# Patient Record
Sex: Male | Born: 1965 | Race: White | Hispanic: No | Marital: Married | Smoking: Former smoker
Health system: Southern US, Community
[De-identification: ages and names within clinical notes are randomized; demographics above are authoritative.]

## PROBLEM LIST (undated history)

## (undated) DIAGNOSIS — I10 Essential (primary) hypertension: Secondary | ICD-10-CM

## (undated) DIAGNOSIS — F329 Major depressive disorder, single episode, unspecified: Secondary | ICD-10-CM

## (undated) DIAGNOSIS — M109 Gout, unspecified: Secondary | ICD-10-CM

## (undated) DIAGNOSIS — B019 Varicella without complication: Secondary | ICD-10-CM

## (undated) DIAGNOSIS — F32A Depression, unspecified: Secondary | ICD-10-CM

## (undated) DIAGNOSIS — R7989 Other specified abnormal findings of blood chemistry: Secondary | ICD-10-CM

## (undated) DIAGNOSIS — I671 Cerebral aneurysm, nonruptured: Secondary | ICD-10-CM

## (undated) DIAGNOSIS — E7800 Pure hypercholesterolemia, unspecified: Secondary | ICD-10-CM

## (undated) HISTORY — DX: Essential (primary) hypertension: I10

## (undated) HISTORY — DX: Varicella without complication: B01.9

## (undated) HISTORY — DX: Major depressive disorder, single episode, unspecified: F32.9

## (undated) HISTORY — DX: Other specified abnormal findings of blood chemistry: R79.89

## (undated) HISTORY — PX: WISDOM TOOTH EXTRACTION: SHX21

## (undated) HISTORY — DX: Depression, unspecified: F32.A

## (undated) HISTORY — DX: Cerebral aneurysm, nonruptured: I67.1

---

## 2002-08-04 ENCOUNTER — Emergency Department (HOSPITAL_COMMUNITY): Admission: EM | Admit: 2002-08-04 | Discharge: 2002-08-05 | Payer: Self-pay | Admitting: Emergency Medicine

## 2008-07-26 ENCOUNTER — Emergency Department (HOSPITAL_COMMUNITY): Admission: EM | Admit: 2008-07-26 | Discharge: 2008-07-26 | Payer: Self-pay | Admitting: Emergency Medicine

## 2015-03-02 ENCOUNTER — Inpatient Hospital Stay (HOSPITAL_COMMUNITY)
Admission: EM | Admit: 2015-03-02 | Discharge: 2015-03-12 | DRG: 020 | Disposition: A | Discharge status: Home / Self Care | Payer: Self-pay | Attending: Neurosurgery | Admitting: Neurosurgery

## 2015-03-02 ENCOUNTER — Inpatient Hospital Stay (HOSPITAL_COMMUNITY): Payer: MEDICAID

## 2015-03-02 ENCOUNTER — Emergency Department (HOSPITAL_COMMUNITY): Payer: Self-pay | Admitting: Anesthesiology

## 2015-03-02 ENCOUNTER — Emergency Department (HOSPITAL_COMMUNITY): Payer: Self-pay

## 2015-03-02 ENCOUNTER — Encounter (HOSPITAL_COMMUNITY): Payer: Self-pay | Admitting: Emergency Medicine

## 2015-03-02 ENCOUNTER — Inpatient Hospital Stay (HOSPITAL_COMMUNITY): Payer: Self-pay

## 2015-03-02 ENCOUNTER — Encounter (HOSPITAL_COMMUNITY)
Admission: EM | Disposition: A | Discharge status: Home / Self Care | Payer: Self-pay | Source: Home / Self Care | Attending: Neurosurgery

## 2015-03-02 ENCOUNTER — Emergency Department (HOSPITAL_COMMUNITY): Payer: MEDICAID | Admitting: Anesthesiology

## 2015-03-02 ENCOUNTER — Ambulatory Visit (INDEPENDENT_AMBULATORY_CARE_PROVIDER_SITE_OTHER): Payer: Self-pay | Admitting: Family Medicine

## 2015-03-02 ENCOUNTER — Inpatient Hospital Stay (HOSPITAL_COMMUNITY): Payer: MEDICAID | Admitting: Certified Registered Nurse Anesthetist

## 2015-03-02 ENCOUNTER — Inpatient Hospital Stay (HOSPITAL_COMMUNITY): Payer: Self-pay | Admitting: Certified Registered Nurse Anesthetist

## 2015-03-02 VITALS — BP 204/116 | HR 79 | Temp 98.3°F | Resp 17 | Ht 73.0 in | Wt 235.2 lb

## 2015-03-02 DIAGNOSIS — R42 Dizziness and giddiness: Secondary | ICD-10-CM

## 2015-03-02 DIAGNOSIS — E876 Hypokalemia: Secondary | ICD-10-CM | POA: Diagnosis present

## 2015-03-02 DIAGNOSIS — M542 Cervicalgia: Secondary | ICD-10-CM

## 2015-03-02 DIAGNOSIS — I10 Essential (primary) hypertension: Secondary | ICD-10-CM | POA: Diagnosis present

## 2015-03-02 DIAGNOSIS — E871 Hypo-osmolality and hyponatremia: Secondary | ICD-10-CM | POA: Diagnosis present

## 2015-03-02 DIAGNOSIS — Z4659 Encounter for fitting and adjustment of other gastrointestinal appliance and device: Secondary | ICD-10-CM

## 2015-03-02 DIAGNOSIS — J9601 Acute respiratory failure with hypoxia: Secondary | ICD-10-CM

## 2015-03-02 DIAGNOSIS — E669 Obesity, unspecified: Secondary | ICD-10-CM | POA: Diagnosis present

## 2015-03-02 DIAGNOSIS — G9340 Encephalopathy, unspecified: Secondary | ICD-10-CM | POA: Diagnosis present

## 2015-03-02 DIAGNOSIS — Z6831 Body mass index (BMI) 31.0-31.9, adult: Secondary | ICD-10-CM

## 2015-03-02 DIAGNOSIS — M109 Gout, unspecified: Secondary | ICD-10-CM | POA: Diagnosis present

## 2015-03-02 DIAGNOSIS — R519 Headache, unspecified: Secondary | ICD-10-CM

## 2015-03-02 DIAGNOSIS — I6011 Nontraumatic subarachnoid hemorrhage from right middle cerebral artery: Principal | ICD-10-CM | POA: Diagnosis present

## 2015-03-02 DIAGNOSIS — G919 Hydrocephalus, unspecified: Secondary | ICD-10-CM

## 2015-03-02 DIAGNOSIS — I609 Nontraumatic subarachnoid hemorrhage, unspecified: Secondary | ICD-10-CM

## 2015-03-02 DIAGNOSIS — Z720 Tobacco use: Secondary | ICD-10-CM

## 2015-03-02 DIAGNOSIS — T465X6A Underdosing of other antihypertensive drugs, initial encounter: Secondary | ICD-10-CM | POA: Diagnosis present

## 2015-03-02 DIAGNOSIS — E780 Pure hypercholesterolemia: Secondary | ICD-10-CM | POA: Diagnosis present

## 2015-03-02 DIAGNOSIS — E785 Hyperlipidemia, unspecified: Secondary | ICD-10-CM | POA: Diagnosis present

## 2015-03-02 DIAGNOSIS — F17210 Nicotine dependence, cigarettes, uncomplicated: Secondary | ICD-10-CM | POA: Diagnosis present

## 2015-03-02 DIAGNOSIS — Z791 Long term (current) use of non-steroidal anti-inflammatories (NSAID): Secondary | ICD-10-CM

## 2015-03-02 DIAGNOSIS — F17200 Nicotine dependence, unspecified, uncomplicated: Secondary | ICD-10-CM

## 2015-03-02 DIAGNOSIS — R51 Headache: Secondary | ICD-10-CM

## 2015-03-02 DIAGNOSIS — I739 Peripheral vascular disease, unspecified: Secondary | ICD-10-CM

## 2015-03-02 DIAGNOSIS — E872 Acidosis: Secondary | ICD-10-CM | POA: Diagnosis not present

## 2015-03-02 HISTORY — DX: Pure hypercholesterolemia, unspecified: E78.00

## 2015-03-02 HISTORY — DX: Gout, unspecified: M10.9

## 2015-03-02 HISTORY — PX: CRANIOTOMY: SHX93

## 2015-03-02 HISTORY — PX: RADIOLOGY WITH ANESTHESIA: SHX6223

## 2015-03-02 LAB — BLOOD GAS, ARTERIAL
Acid-base deficit: 3.9 mmol/L — ABNORMAL HIGH (ref 0.0–2.0)
Bicarbonate: 21.9 mEq/L (ref 20.0–24.0)
DRAWN BY: 29017
FIO2: 0.4 %
LHR: 14 {breaths}/min
O2 Saturation: 91.3 %
PCO2 ART: 48.9 mmHg — AB (ref 35.0–45.0)
PEEP: 5 cmH2O
PO2 ART: 68.8 mmHg — AB (ref 80.0–100.0)
Patient temperature: 98.6
TCO2: 23.4 mmol/L (ref 0–100)
VT: 500 mL
pH, Arterial: 7.273 — ABNORMAL LOW (ref 7.350–7.450)

## 2015-03-02 LAB — URINALYSIS, ROUTINE W REFLEX MICROSCOPIC
Bilirubin Urine: NEGATIVE
Glucose, UA: NEGATIVE mg/dL
HGB URINE DIPSTICK: NEGATIVE
Ketones, ur: NEGATIVE mg/dL
Leukocytes, UA: NEGATIVE
NITRITE: NEGATIVE
PH: 6 (ref 5.0–8.0)
Protein, ur: 30 mg/dL — AB
SPECIFIC GRAVITY, URINE: 1.017 (ref 1.005–1.030)
Urobilinogen, UA: 0.2 mg/dL (ref 0.0–1.0)

## 2015-03-02 LAB — BASIC METABOLIC PANEL
ANION GAP: 8 (ref 5–15)
BUN: 11 mg/dL (ref 6–20)
CALCIUM: 9.2 mg/dL (ref 8.9–10.3)
CO2: 25 mmol/L (ref 22–32)
Chloride: 106 mmol/L (ref 101–111)
Creatinine, Ser: 0.79 mg/dL (ref 0.61–1.24)
GFR calc Af Amer: >60 mL/min (ref >60)
GFR calc non Af Amer: >60 mL/min (ref >60)
Glucose, Bld: 107 mg/dL — ABNORMAL HIGH (ref 65–99)
Potassium: 3.7 mmol/L (ref 3.5–5.1)
SODIUM: 139 mmol/L (ref 135–145)

## 2015-03-02 LAB — ABO/RH: ABO/RH(D): A NEG

## 2015-03-02 LAB — CBC WITH DIFFERENTIAL/PLATELET
BASOS ABS: 0 10*3/uL (ref 0.0–0.1)
BASOS PCT: 0 % (ref 0–1)
EOS PCT: 1 % (ref 0–5)
Eosinophils Absolute: 0.1 10*3/uL (ref 0.0–0.7)
HEMATOCRIT: 40.9 % (ref 39.0–52.0)
Hemoglobin: 14.3 g/dL (ref 13.0–17.0)
Lymphocytes Relative: 18 % (ref 12–46)
Lymphs Abs: 1.7 10*3/uL (ref 0.7–4.0)
MCH: 32.3 pg (ref 26.0–34.0)
MCHC: 35 g/dL (ref 30.0–36.0)
MCV: 92.3 fL (ref 78.0–100.0)
MONO ABS: 0.4 10*3/uL (ref 0.1–1.0)
Monocytes Relative: 4 % (ref 3–12)
Neutro Abs: 7.3 10*3/uL (ref 1.7–7.7)
Neutrophils Relative %: 77 % (ref 43–77)
Platelets: 260 10*3/uL (ref 150–400)
RBC: 4.43 MIL/uL (ref 4.22–5.81)
RDW: 13.3 % (ref 11.5–15.5)
WBC: 9.5 10*3/uL (ref 4.0–10.5)

## 2015-03-02 LAB — URINE MICROSCOPIC-ADD ON

## 2015-03-02 LAB — PREPARE RBC (CROSSMATCH)

## 2015-03-02 SURGERY — CRANIOTOMY INTRACRANIAL ANEURYSM FOR CAROTID
Anesthesia: General | Site: Head | Laterality: Right

## 2015-03-02 SURGERY — RADIOLOGY WITH ANESTHESIA
Anesthesia: General

## 2015-03-02 MED ORDER — DEXAMETHASONE SODIUM PHOSPHATE 10 MG/ML IJ SOLN
INTRAMUSCULAR | Status: AC
  Filled 2015-03-02: qty 2

## 2015-03-02 MED ORDER — MORPHINE SULFATE 2 MG/ML IJ SOLN: 1.0000 mg | INTRAMUSCULAR | Status: DC | PRN

## 2015-03-02 MED ORDER — ONDANSETRON HCL 4 MG/2ML IJ SOLN: 4.0000 mg | INTRAMUSCULAR | Status: DC | PRN

## 2015-03-02 MED ORDER — 0.9 % SODIUM CHLORIDE (POUR BTL) OPTIME
TOPICAL | Status: DC | PRN
  Administered 2015-03-02 (×3): 1000 mL

## 2015-03-02 MED ORDER — LEVETIRACETAM 500 MG/5ML IV SOLN
500.0000 mg | Freq: Two times a day (BID) | INTRAVENOUS | Status: DC
  Administered 2015-03-03 – 2015-03-06 (×7): 500 mg via INTRAVENOUS
  Filled 2015-03-02 (×8): qty 5

## 2015-03-02 MED ORDER — NIMODIPINE 30 MG PO CAPS
60.0000 mg | ORAL_CAPSULE | ORAL | Status: DC
  Administered 2015-03-03 – 2015-03-12 (×55): 60 mg via ORAL
  Filled 2015-03-02 (×63): qty 2

## 2015-03-02 MED ORDER — MANNITOL 25 % IV SOLN
INTRAVENOUS | Status: DC | PRN
  Administered 2015-03-02 (×2): 12.5 g via INTRAVENOUS

## 2015-03-02 MED ORDER — SODIUM CHLORIDE 0.9 % IV SOLN
INTRAVENOUS | Status: DC | PRN
  Administered 2015-03-02: 16:00:00 via INTRAVENOUS

## 2015-03-02 MED ORDER — LABETALOL HCL 5 MG/ML IV SOLN
10.0000 mg | INTRAVENOUS | Status: DC | PRN
  Administered 2015-03-04: 20 mg via INTRAVENOUS
  Administered 2015-03-04: 10 mg via INTRAVENOUS
  Administered 2015-03-04: 20 mg via INTRAVENOUS
  Administered 2015-03-04: 10 mg via INTRAVENOUS
  Administered 2015-03-04: 20 mg via INTRAVENOUS
  Filled 2015-03-02 (×2): qty 4
  Filled 2015-03-02: qty 8

## 2015-03-02 MED ORDER — DEXAMETHASONE SODIUM PHOSPHATE 10 MG/ML IJ SOLN
INTRAMUSCULAR | Status: DC | PRN
  Administered 2015-03-02: 10 mg via INTRAVENOUS

## 2015-03-02 MED ORDER — POTASSIUM CHLORIDE 20 MEQ/15ML (10%) PO SOLN
40.0000 meq | Freq: Once | ORAL | Status: DC
  Filled 2015-03-02: qty 30

## 2015-03-02 MED ORDER — FENTANYL CITRATE (PF) 250 MCG/5ML IJ SOLN
INTRAMUSCULAR | Status: AC
  Filled 2015-03-02: qty 5

## 2015-03-02 MED ORDER — BUPIVACAINE HCL 0.5 % IJ SOLN
INTRAMUSCULAR | Status: DC | PRN
  Administered 2015-03-02: 7.5 mL

## 2015-03-02 MED ORDER — BACITRACIN ZINC 500 UNIT/GM EX OINT
TOPICAL_OINTMENT | CUTANEOUS | Status: DC | PRN
  Administered 2015-03-02: 1 via TOPICAL

## 2015-03-02 MED ORDER — CETYLPYRIDINIUM CHLORIDE 0.05 % MT LIQD
7.0000 mL | Freq: Four times a day (QID) | OROMUCOSAL | Status: DC
  Administered 2015-03-03 (×4): 7 mL via OROMUCOSAL

## 2015-03-02 MED ORDER — MICROFIBRILLAR COLL HEMOSTAT EX PADS
MEDICATED_PAD | CUTANEOUS | Status: DC | PRN
  Administered 2015-03-02: 1 via TOPICAL

## 2015-03-02 MED ORDER — DEXAMETHASONE SODIUM PHOSPHATE 10 MG/ML IJ SOLN
6.0000 mg | Freq: Four times a day (QID) | INTRAMUSCULAR | Status: AC
  Administered 2015-03-02 – 2015-03-03 (×4): 6 mg via INTRAVENOUS
  Filled 2015-03-02: qty 1
  Filled 2015-03-02: qty 0.6
  Filled 2015-03-02 (×2): qty 1
  Filled 2015-03-02: qty 0.6

## 2015-03-02 MED ORDER — SUCCINYLCHOLINE CHLORIDE 20 MG/ML IJ SOLN
INTRAMUSCULAR | Status: DC | PRN
Start: 2015-03-02 — End: 2015-03-02
  Administered 2015-03-02: 120 mg via INTRAVENOUS

## 2015-03-02 MED ORDER — LABETALOL HCL 5 MG/ML IV SOLN
5.0000 mg | Freq: Once | INTRAVENOUS | Status: AC
  Administered 2015-03-02: 5 mg via INTRAVENOUS
  Filled 2015-03-02: qty 4

## 2015-03-02 MED ORDER — PHENYLEPHRINE 40 MCG/ML (10ML) SYRINGE FOR IV PUSH (FOR BLOOD PRESSURE SUPPORT)
PREFILLED_SYRINGE | INTRAVENOUS | Status: AC
  Filled 2015-03-02: qty 10

## 2015-03-02 MED ORDER — NICARDIPINE HCL IN NACL 20-0.86 MG/200ML-% IV SOLN: 3.0000 mg/h | INTRAVENOUS | Status: DC

## 2015-03-02 MED ORDER — SENNOSIDES-DOCUSATE SODIUM 8.6-50 MG PO TABS
1.0000 | ORAL_TABLET | Freq: Every evening | ORAL | Status: DC | PRN
  Filled 2015-03-02: qty 1

## 2015-03-02 MED ORDER — THROMBIN 5000 UNITS EX SOLR
OROMUCOSAL | Status: DC | PRN
  Administered 2015-03-02: 19:00:00 via TOPICAL

## 2015-03-02 MED ORDER — ARTIFICIAL TEARS OP OINT
TOPICAL_OINTMENT | OPHTHALMIC | Status: DC | PRN
Start: 2015-03-02 — End: 2015-03-02
  Administered 2015-03-02: 1 via OPHTHALMIC

## 2015-03-02 MED ORDER — PROPOFOL 1000 MG/100ML IV EMUL
0.0000 ug/kg/min | INTRAVENOUS | Status: DC
Start: 2015-03-02 — End: 2015-03-06
  Administered 2015-03-02: 35 ug/kg/min via INTRAVENOUS
  Administered 2015-03-03: 30.019 ug/kg/min via INTRAVENOUS
  Administered 2015-03-03: 5 ug/kg/min via INTRAVENOUS
  Filled 2015-03-02 (×3): qty 100

## 2015-03-02 MED ORDER — IOHEXOL 350 MG/ML SOLN
80.0000 mL | Freq: Once | INTRAVENOUS | Status: AC | PRN
  Administered 2015-03-02: 80 mL via INTRAVENOUS

## 2015-03-02 MED ORDER — LIDOCAINE HCL (CARDIAC) 20 MG/ML IV SOLN
INTRAVENOUS | Status: DC | PRN
  Administered 2015-03-02: 100 mg via INTRAVENOUS

## 2015-03-02 MED ORDER — INDOCYANINE GREEN 25 MG IV SOLR
5.0000 mg | Freq: Once | INTRAVENOUS | Status: AC
  Administered 2015-03-02: 12.5 mg via INTRAVENOUS
  Filled 2015-03-02: qty 25

## 2015-03-02 MED ORDER — NICARDIPINE HCL IN NACL 20-0.86 MG/200ML-% IV SOLN
5.0000 mg/h | Freq: Once | INTRAVENOUS | Status: AC
  Administered 2015-03-02: 5 mg/h via INTRAVENOUS
  Filled 2015-03-02: qty 200

## 2015-03-02 MED ORDER — PANTOPRAZOLE SODIUM 40 MG IV SOLR
40.0000 mg | Freq: Every day | INTRAVENOUS | Status: DC
  Administered 2015-03-02 – 2015-03-03 (×2): 40 mg via INTRAVENOUS
  Filled 2015-03-02 (×3): qty 40

## 2015-03-02 MED ORDER — FENTANYL CITRATE (PF) 100 MCG/2ML IJ SOLN
INTRAMUSCULAR | Status: DC | PRN
  Administered 2015-03-02: 100 ug via INTRAVENOUS

## 2015-03-02 MED ORDER — FENTANYL CITRATE (PF) 100 MCG/2ML IJ SOLN
INTRAMUSCULAR | Status: DC | PRN
  Administered 2015-03-02 (×7): 50 ug via INTRAVENOUS

## 2015-03-02 MED ORDER — DEXAMETHASONE SODIUM PHOSPHATE 4 MG/ML IJ SOLN
4.0000 mg | Freq: Four times a day (QID) | INTRAMUSCULAR | Status: AC
  Administered 2015-03-04 (×4): 4 mg via INTRAVENOUS
  Filled 2015-03-02 (×5): qty 1

## 2015-03-02 MED ORDER — SODIUM CHLORIDE 0.9 % IV SOLN
1000.0000 mg | Freq: Two times a day (BID) | INTRAVENOUS | Status: AC
  Administered 2015-03-02: 1000 mg via INTRAVENOUS
  Filled 2015-03-02: qty 10

## 2015-03-02 MED ORDER — CEFAZOLIN SODIUM-DEXTROSE 2-3 GM-% IV SOLR
2.0000 g | Freq: Three times a day (TID) | INTRAVENOUS | Status: AC
  Administered 2015-03-02 – 2015-03-03 (×2): 2 g via INTRAVENOUS
  Filled 2015-03-02 (×2): qty 50

## 2015-03-02 MED ORDER — PROMETHAZINE HCL 25 MG PO TABS: 12.5000 mg | ORAL_TABLET | ORAL | Status: DC | PRN

## 2015-03-02 MED ORDER — CEFAZOLIN SODIUM-DEXTROSE 2-3 GM-% IV SOLR
INTRAVENOUS | Status: DC | PRN
  Administered 2015-03-02: 2 g via INTRAVENOUS

## 2015-03-02 MED ORDER — CEFAZOLIN SODIUM-DEXTROSE 2-3 GM-% IV SOLR
INTRAVENOUS | Status: AC
  Filled 2015-03-02: qty 50

## 2015-03-02 MED ORDER — BISACODYL 10 MG RE SUPP: 10.0000 mg | Freq: Every day | RECTAL | Status: DC | PRN

## 2015-03-02 MED ORDER — ONDANSETRON HCL 4 MG PO TABS: 4.0000 mg | ORAL_TABLET | ORAL | Status: DC | PRN

## 2015-03-02 MED ORDER — NICARDIPINE HCL IN NACL 20-0.86 MG/200ML-% IV SOLN
5.0000 mg/h | Freq: Once | INTRAVENOUS | Status: AC
  Administered 2015-03-02: 10 mg/h via INTRAVENOUS
  Administered 2015-03-02: 12 mg/h via INTRAVENOUS
  Filled 2015-03-02: qty 200

## 2015-03-02 MED ORDER — PROPOFOL INFUSION 10 MG/ML OPTIME
INTRAVENOUS | Status: DC | PRN
  Administered 2015-03-02: 40 ug/kg/min via INTRAVENOUS

## 2015-03-02 MED ORDER — ROCURONIUM BROMIDE 100 MG/10ML IV SOLN
INTRAVENOUS | Status: DC | PRN
  Administered 2015-03-02 (×2): 20 mg via INTRAVENOUS
  Administered 2015-03-02: 10 mg via INTRAVENOUS
  Administered 2015-03-02: 50 mg via INTRAVENOUS
  Administered 2015-03-02: 20 mg via INTRAVENOUS
  Administered 2015-03-02: 50 mg via INTRAVENOUS
  Administered 2015-03-02: 10 mg via INTRAVENOUS
  Administered 2015-03-02: 20 mg via INTRAVENOUS

## 2015-03-02 MED ORDER — NIMODIPINE 60 MG/20ML PO SOLN
60.0000 mg | ORAL | Status: DC
  Administered 2015-03-03 (×4): 60 mg
  Filled 2015-03-02 (×15): qty 20

## 2015-03-02 MED ORDER — LIDOCAINE HCL (PF) 1 % IJ SOLN
INTRAMUSCULAR | Status: DC | PRN
  Administered 2015-03-02: 7.5 mL

## 2015-03-02 MED ORDER — ROCURONIUM BROMIDE 50 MG/5ML IV SOLN
INTRAVENOUS | Status: AC
  Filled 2015-03-02: qty 1

## 2015-03-02 MED ORDER — PROPOFOL INFUSION 10 MG/ML OPTIME: INTRAVENOUS | Status: DC | PRN

## 2015-03-02 MED ORDER — DEXAMETHASONE SODIUM PHOSPHATE 4 MG/ML IJ SOLN
4.0000 mg | Freq: Three times a day (TID) | INTRAMUSCULAR | Status: DC
  Administered 2015-03-04 – 2015-03-10 (×17): 4 mg via INTRAVENOUS
  Filled 2015-03-02 (×27): qty 1

## 2015-03-02 MED ORDER — CHLORHEXIDINE GLUCONATE 0.12 % MT SOLN
15.0000 mL | Freq: Two times a day (BID) | OROMUCOSAL | Status: DC
  Administered 2015-03-02 – 2015-03-03 (×2): 15 mL via OROMUCOSAL
  Filled 2015-03-02 (×2): qty 15

## 2015-03-02 MED ORDER — FENTANYL CITRATE (PF) 100 MCG/2ML IJ SOLN
100.0000 ug | INTRAMUSCULAR | Status: AC | PRN
  Administered 2015-03-03 (×3): 100 ug via INTRAVENOUS
  Filled 2015-03-02 (×2): qty 2

## 2015-03-02 MED ORDER — MANNITOL 25 % IV SOLN
25.0000 g | Freq: Once | INTRAVENOUS | Status: DC
  Filled 2015-03-02: qty 100

## 2015-03-02 MED ORDER — PROPOFOL 10 MG/ML IV BOLUS
INTRAVENOUS | Status: DC | PRN
  Administered 2015-03-02 (×2): 50 mg via INTRAVENOUS

## 2015-03-02 MED ORDER — PROPOFOL 10 MG/ML IV BOLUS
INTRAVENOUS | Status: DC | PRN
  Administered 2015-03-02: 50 mg via INTRAVENOUS
  Administered 2015-03-02: 200 mg via INTRAVENOUS
  Administered 2015-03-02 (×2): 50 mg via INTRAVENOUS

## 2015-03-02 MED ORDER — IOHEXOL 300 MG/ML  SOLN
50.0000 mL | Freq: Once | INTRAMUSCULAR | Status: AC | PRN
  Administered 2015-03-02: 50 mL via INTRAVENOUS

## 2015-03-02 MED ORDER — THROMBIN 20000 UNITS EX SOLR
CUTANEOUS | Status: DC | PRN
  Administered 2015-03-02: 19:00:00 via TOPICAL

## 2015-03-02 MED ORDER — FENTANYL CITRATE (PF) 100 MCG/2ML IJ SOLN
100.0000 ug | INTRAMUSCULAR | Status: DC | PRN
  Administered 2015-03-03 – 2015-03-09 (×2): 100 ug via INTRAVENOUS
  Filled 2015-03-02 (×3): qty 2

## 2015-03-02 MED ORDER — PROPOFOL INFUSION 10 MG/ML OPTIME
INTRAVENOUS | Status: DC | PRN
  Administered 2015-03-02: 75 ug/kg/min via INTRAVENOUS

## 2015-03-02 MED ORDER — SODIUM CHLORIDE 0.9 % IV SOLN
INTRAVENOUS | Status: DC | PRN
  Administered 2015-03-02 (×2): via INTRAVENOUS

## 2015-03-02 MED ORDER — PHENYLEPHRINE 40 MCG/ML (10ML) SYRINGE FOR IV PUSH (FOR BLOOD PRESSURE SUPPORT)
PREFILLED_SYRINGE | INTRAVENOUS | Status: AC
  Filled 2015-03-02: qty 20

## 2015-03-02 MED ORDER — SODIUM CHLORIDE 0.9 % IR SOLN
Status: DC | PRN
  Administered 2015-03-02: 19:00:00

## 2015-03-02 MED ORDER — SODIUM CHLORIDE 0.9 % IV SOLN: Freq: Once | INTRAVENOUS | Status: DC

## 2015-03-02 MED ORDER — SODIUM CHLORIDE 0.9 % IV SOLN
INTRAVENOUS | Status: DC
  Administered 2015-03-02 – 2015-03-06 (×3): via INTRAVENOUS

## 2015-03-02 MED ORDER — NICARDIPINE HCL IN NACL 20-0.86 MG/200ML-% IV SOLN
INTRAVENOUS | Status: DC | PRN
  Administered 2015-03-02: 10 mg/h via INTRAVENOUS

## 2015-03-02 SURGICAL SUPPLY — 103 items
BANDAGE GAUZE 4  KLING STR (GAUZE/BANDAGES/DRESSINGS) ×6 IMPLANT
BATTERY IQ STERILE (MISCELLANEOUS) ×3 IMPLANT
BENZOIN TINCTURE PRP APPL 2/3 (GAUZE/BANDAGES/DRESSINGS) IMPLANT
BIT DRILL WIRE PASS 1.3MM (BIT) IMPLANT
BLADE SAW GIGLI 16 STRL (MISCELLANEOUS) IMPLANT
BLADE SURG 15 STRL LF DISP TIS (BLADE) ×1 IMPLANT
BLADE SURG 15 STRL SS (BLADE) ×2
BLADE ULTRA TIP 2M (BLADE) IMPLANT
BNDG GAUZE ELAST 4 BULKY (GAUZE/BANDAGES/DRESSINGS) ×6 IMPLANT
BRUSH SCRUB EZ PLAIN DRY (MISCELLANEOUS) ×3 IMPLANT
BUR ACORN 6.0 PRECISION (BURR) ×2 IMPLANT
BUR ACORN 6.0MM PRECISION (BURR) ×1
BUR ADDG 1.1 (BURR) IMPLANT
BUR ADDG 1.1MM (BURR)
BUR MATCHSTICK NEURO 3.0 LAGG (BURR) IMPLANT
BUR ROUND FLUTED 4 SOFT TCH (BURR) IMPLANT
BUR ROUND FLUTED 4MM SOFT TCH (BURR)
BUR ROUND FLUTED 5 RND (BURR) ×2 IMPLANT
BUR ROUND FLUTED 5MM RND (BURR) ×1
BUR SPIRAL ROUTER 2.3 (BUR) IMPLANT
BUR SPIRAL ROUTER 2.3MM (BUR)
CANISTER SUCT 3000ML PPV (MISCELLANEOUS) ×3 IMPLANT
CLIP ANEURY TI PERM MINI STR 5 (Clip) ×3 IMPLANT
CLIP TI MEDIUM 6 (CLIP) IMPLANT
CONT SPEC 4OZ CLIKSEAL STRL BL (MISCELLANEOUS) ×3 IMPLANT
CORDS BIPOLAR (ELECTRODE) IMPLANT
COVER MAYO STAND STRL (DRAPES) IMPLANT
DECANTER SPIKE VIAL GLASS SM (MISCELLANEOUS) ×3 IMPLANT
DRAIN SNY WOU 7FLT (WOUND CARE) IMPLANT
DRAPE MICROSCOPE LEICA (MISCELLANEOUS) ×3 IMPLANT
DRAPE NEUROLOGICAL W/INCISE (DRAPES) ×3 IMPLANT
DRAPE WARM FLUID 44X44 (DRAPE) ×3 IMPLANT
DRILL WIRE PASS 1.3MM (BIT)
DRSG ADAPTIC 3X8 NADH LF (GAUZE/BANDAGES/DRESSINGS) ×3 IMPLANT
DRSG TELFA 3X8 NADH (GAUZE/BANDAGES/DRESSINGS) IMPLANT
DURAMATRIX ONLAY 3X3 (Plate) ×3 IMPLANT
DURAPREP 6ML APPLICATOR 50/CS (WOUND CARE) ×3 IMPLANT
ELECT CAUTERY BLADE 6.4 (BLADE) ×3 IMPLANT
ELECT REM PT RETURN 9FT ADLT (ELECTROSURGICAL) ×3
ELECTRODE REM PT RTRN 9FT ADLT (ELECTROSURGICAL) ×1 IMPLANT
EVACUATOR SILICONE 100CC (DRAIN) IMPLANT
FORCEPS BIPOLAR SPETZLER 8 1.0 (NEUROSURGERY SUPPLIES) ×3 IMPLANT
GAUZE SPONGE 4X4 12PLY STRL (GAUZE/BANDAGES/DRESSINGS) ×3 IMPLANT
GAUZE SPONGE 4X4 16PLY XRAY LF (GAUZE/BANDAGES/DRESSINGS) IMPLANT
GLOVE BIO SURGEON STRL SZ 6.5 (GLOVE) ×8 IMPLANT
GLOVE BIO SURGEON STRL SZ7 (GLOVE) ×3 IMPLANT
GLOVE BIO SURGEON STRL SZ8 (GLOVE) ×3 IMPLANT
GLOVE BIO SURGEON STRL SZ8.5 (GLOVE) ×3 IMPLANT
GLOVE BIO SURGEONS STRL SZ 6.5 (GLOVE) ×4
GLOVE BIOGEL PI IND STRL 7.0 (GLOVE) ×1 IMPLANT
GLOVE BIOGEL PI IND STRL 7.5 (GLOVE) ×1 IMPLANT
GLOVE BIOGEL PI INDICATOR 7.0 (GLOVE) ×2
GLOVE BIOGEL PI INDICATOR 7.5 (GLOVE) ×2
GLOVE ECLIPSE 7.0 STRL STRAW (GLOVE) ×6 IMPLANT
GLOVE EXAM NITRILE LRG STRL (GLOVE) IMPLANT
GLOVE EXAM NITRILE MD LF STRL (GLOVE) IMPLANT
GLOVE EXAM NITRILE XL STR (GLOVE) IMPLANT
GLOVE EXAM NITRILE XS STR PU (GLOVE) IMPLANT
GLOVE INDICATOR 6.5 STRL GRN (GLOVE) ×6 IMPLANT
GOWN STRL REUS W/ TWL LRG LVL3 (GOWN DISPOSABLE) ×4 IMPLANT
GOWN STRL REUS W/ TWL XL LVL3 (GOWN DISPOSABLE) ×1 IMPLANT
GOWN STRL REUS W/TWL 2XL LVL3 (GOWN DISPOSABLE) IMPLANT
GOWN STRL REUS W/TWL LRG LVL3 (GOWN DISPOSABLE) ×8
GOWN STRL REUS W/TWL XL LVL3 (GOWN DISPOSABLE) ×2
HEMOSTAT SURGICEL 2X14 (HEMOSTASIS) ×3 IMPLANT
HOOK DURA (MISCELLANEOUS) ×3 IMPLANT
KIT BASIN OR (CUSTOM PROCEDURE TRAY) ×3 IMPLANT
KIT DRAIN CSF ACCUDRAIN (MISCELLANEOUS) IMPLANT
KIT ROOM TURNOVER OR (KITS) ×3 IMPLANT
KNIFE ARACHNOID DISP AM-24-S (MISCELLANEOUS) ×3 IMPLANT
NEEDLE HYPO 25X1 1.5 SAFETY (NEEDLE) ×3 IMPLANT
NS IRRIG 1000ML POUR BTL (IV SOLUTION) ×3 IMPLANT
PACK CRANIOTOMY (CUSTOM PROCEDURE TRAY) ×3 IMPLANT
PAD ARMBOARD 7.5X6 YLW CONV (MISCELLANEOUS) ×3 IMPLANT
PATTIES SURGICAL .25X.25 (GAUZE/BANDAGES/DRESSINGS) IMPLANT
PATTIES SURGICAL .5 X.5 (GAUZE/BANDAGES/DRESSINGS) IMPLANT
PATTIES SURGICAL .5 X3 (DISPOSABLE) IMPLANT
PATTIES SURGICAL 1/4 X 3 (GAUZE/BANDAGES/DRESSINGS) IMPLANT
PATTIES SURGICAL 1X1 (DISPOSABLE) IMPLANT
PIN MAYFIELD SKULL DISP (PIN) IMPLANT
PLATE 1.5  2HOLE LNG NEURO (Plate) ×6 IMPLANT
PLATE 1.5 2HOLE LNG NEURO (Plate) ×3 IMPLANT
RUBBERBAND STERILE (MISCELLANEOUS) ×6 IMPLANT
SCREW SELF DRILL HT 1.5/4MM (Screw) ×18 IMPLANT
SPONGE NEURO XRAY DETECT 1X3 (DISPOSABLE) IMPLANT
SPONGE SURGIFOAM ABS GEL 100 (HEMOSTASIS) ×3 IMPLANT
SPONGE SURGIFOAM ABS GEL 100C (HEMOSTASIS) IMPLANT
STAPLER VISISTAT 35W (STAPLE) ×3 IMPLANT
STOCKINETTE 6  STRL (DRAPES) ×2
STOCKINETTE 6 STRL (DRAPES) ×1 IMPLANT
SUT ETHILON 3 0 FSL (SUTURE) IMPLANT
SUT NURALON 4 0 TR CR/8 (SUTURE) ×6 IMPLANT
SUT VIC AB 0 CT1 18XCR BRD8 (SUTURE) ×2 IMPLANT
SUT VIC AB 0 CT1 8-18 (SUTURE) ×4
SUT VIC AB 3-0 SH 8-18 (SUTURE) ×9 IMPLANT
SYR 20ML ECCENTRIC (SYRINGE) ×3 IMPLANT
SYR CONTROL 10ML LL (SYRINGE) ×3 IMPLANT
TAPE CLOTH 1X10 TAN NS (GAUZE/BANDAGES/DRESSINGS) ×3 IMPLANT
TOWEL OR 17X24 6PK STRL BLUE (TOWEL DISPOSABLE) ×3 IMPLANT
TOWEL OR 17X26 10 PK STRL BLUE (TOWEL DISPOSABLE) ×3 IMPLANT
TRAY FOLEY W/METER SILVER 14FR (SET/KITS/TRAYS/PACK) IMPLANT
UNDERPAD 30X30 INCONTINENT (UNDERPADS AND DIAPERS) IMPLANT
WATER STERILE IRR 1000ML POUR (IV SOLUTION) ×3 IMPLANT

## 2015-03-02 NOTE — Patient Instructions (Signed)
Go to [facility] ER. He will need to be checked into a rule out for a stroke or aneurysm.

## 2015-03-02 NOTE — ED Provider Notes (Signed)
CSN: [id number]     Arrival date & time 03/02/15  1043 History   First MD Initiated Contact with Patient 03/02/15 1101     Chief Complaint  Patient presents with  . Headache  . Hypertension     (Consider location/radiation/quality/duration/timing/severity/associated sxs/prior Treatment) HPI Daniel Butler is a 49 y.o. male with history of hypertension, high cholesterol and gout who comes in for evaluation of headache. Patient states yesterday morning at approximately 3 AM, he woke up per his usual schedule and sat down couch and immediately experienced a sudden onset pounding headache throughout his entire head. He reports putting an ice pack on his head and experiencing immediate relief. He also reports using NSAIDs that also contributed a little bit. He reports maximal intensity at onset and now rates his discomfort as a 5/10. His wife is with him who reports that he is at baseline. Denies any vision changes, confusion, numbness or weakness, difficulties with ambulation, slurred speech, unilateral deficits. No chest pain, shortness of breath, nausea or vomiting, photophobia, phonophobia.  Patient reports he was put on blood pressure medications, but discontinued them voluntarily 15 years ago because he thought his blood pressure was in the normal range after checking it at [facility]  Past Medical History  Diagnosis Date  . Hypertension   . High cholesterol   . Gout    Past Surgical History  Procedure Laterality Date  . Wisdom tooth extraction     Family History  Problem Relation Age of Onset  . Adopted: Yes   History  Substance Use Topics  . Smoking status: Current Every Day Smoker -- 1.50 packs/day    Types: Cigarettes  . Smokeless tobacco: Not on file  . Alcohol Use: 21.0 oz/week    0 Standard drinks or equivalent, 35 Cans of beer per week    Review of Systems A 10 point review of systems was completed and was negative except for pertinent positives and negatives as  mentioned in the history of present illness     Allergies  Review of patient's allergies indicates no known allergies.  Home Medications   Prior to Admission medications   Medication Sig Start Date End Date Taking? Authorizing Provider  Aspirin-Acetaminophen-Caffeine (GOODY HEADACHE PO) Take by mouth.   Yes Historical Provider, MD  ibuprofen (ADVIL,MOTRIN) 200 MG tablet Take 200 mg by mouth every 6 (six) hours as needed.   Yes Historical Provider, MD   BP 193/84 mmHg  Pulse 73  Temp(Src) 98.2 F (36.8 C)  Resp 11  Ht  (1.854 m)  Wt 235 lb (106.595 kg)  BMI 31.01 kg/m2  SpO2 100% Physical Exam  Constitutional: He is oriented to person, place, and time. He appears well-developed and well-nourished.  HENT:  Head: Normocephalic and atraumatic.  Mouth/Throat: Oropharynx is clear and moist.  Eyes: Conjunctivae are normal. Pupils are equal, round, and reactive to light. Right eye exhibits no discharge. Left eye exhibits no discharge. No scleral icterus.  Neck: Neck supple.  Cardiovascular: Normal rate, regular rhythm and normal heart sounds.   Pulmonary/Chest: Effort normal and breath sounds normal. No respiratory distress. He has no wheezes. He has no rales.  Abdominal: Soft. There is no tenderness.  Musculoskeletal: He exhibits no tenderness.  Neurological: He is alert and oriented to person, place, and time.  Cranial Nerves II-XII grossly intact. Motor and sensation 5/5 in all 4 extremities. Extraocular movements intact without nystagmus. Completes finger to nose coordination movements without difficulty.  Skin: Skin is warm and  dry. No rash noted.  Psychiatric: He has a normal mood and affect.  Nursing note and vitals reviewed.   ED Course  Procedures (including critical care time) Labs Review Labs Reviewed  BASIC METABOLIC PANEL - Abnormal; Notable for the following:    Glucose, Bld 107 (*)    All other components within normal limits  URINALYSIS, ROUTINE W REFLEX  MICROSCOPIC (NOT AT [facility]) - Abnormal; Notable for the following:    Protein, ur 30 (*)    All other components within normal limits  CBC WITH DIFFERENTIAL/PLATELET  URINE MICROSCOPIC-ADD ON    Imaging Review Ct Angio Head W/cm &/or Wo Cm  03/02/2015   ADDENDUM REPORT: 03/02/2015 16:46  ADDENDUM: Critical Value/emergent results were called by telephone at the time of interpretation on 03/02/2015 at 3:20 to [facility] PA and Gray Bernhardt MD, who verbally acknowledged these results.   Electronically Signed   By: Marlan Palau M.D.   On: 03/02/2015 16:46   03/02/2015   CLINICAL DATA:  Subarachnoid hemorrhage  EXAM: CT ANGIOGRAPHY HEAD  TECHNIQUE: Multidetector CT imaging of the head was performed using the standard protocol during bolus administration of intravenous contrast. Multiplanar CT image reconstructions and MIPs were obtained to evaluate the vascular anatomy.  CONTRAST:  80mL OMNIPAQUE IOHEXOL 350 MG/ML SOLN  COMPARISON:  CT head from today  FINDINGS: CT HEAD  Brain: Subarachnoid hemorrhage, right greater than left. Mild intraventricular hemorrhage with early hydrocephalus. No acute ischemic infarct.  Calvarium and skull base:  negative  Paranasal sinuses:  mild mucosal edema in the paranasal sinuses.  Orbits: Negative  CTA HEAD  Anterior circulation: Cavernous carotid widely patent bilaterally. Anterior and middle cerebral arteries are patent bilaterally without significant stenosis.  Aneurysm of the right middle cerebral artery measures 7.4 x 4.8 mm. The aneurysm projects superiorly and laterally from the right middle cerebral artery at a branch point. There is a small temporal lobe branch arising from the M1 segment at the base of the aneurysm. No other aneurysm in the anterior circulation.  Posterior circulation: Both vertebral arteries are patent to the basilar. Left vertebral patent. PICA patent bilaterally. Basilar widely patent. Superior cerebellar and posterior cerebral arteries widely  patent bilaterally without stenosis or aneurysm.  Venous sinuses: Patent  Anatomic variants: None  Delayed phase:No enhancing mass lesion.  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 The aneurysm measures 7.4 x 4.8 mm. No significant vasospasm or vessel occlusion. No other aneurysm.  Early hydrocephalus.  Electronically Signed: By: Marlan Palau M.D. On: 03/02/2015 15:48   Ct Head Wo Contrast  03/02/2015   CLINICAL DATA:  Two day history of severe posterior headache.  EXAM: CT HEAD WITHOUT CONTRAST  TECHNIQUE: Contiguous axial images were obtained from the base of the skull through the vertex without intravenous contrast.  COMPARISON:  None.  FINDINGS: There is fairly diffuse subarachnoid hemorrhage noted with a preponderance of blood in the right sylvian fissure and right parietal lobe. This is likely due to an aneurysm. There is also intraventricular hemorrhage noted in the left frontal horn, third ventricle and layering in the occipital horns. A small amount of blood may also be in the fourth ventricle. The ventricles are enlarged for the patient's age. The gray-white differentiation is maintained. The globes are intact. No subdural or epidural hematoma.  The bony structures are intact.  There is scattered sinus disease.  IMPRESSION: Diffuse subarachnoid hemorrhage with a preponderance of blood in the right sylvian fissure  and right parietal lobe which may suggest an MCA aneurysm rupture. There is also intraventricular hemorrhage and ventricular dilatation.  These results were called by telephone at the time of interpretation on 03/02/2015 at 12:46 pm to Dr. Effie Shy, who verbally acknowledged these results.   Electronically Signed   By: Rudie Meyer M.D.   On: 03/02/2015 12:45     EKG Interpretation None     Meds given in ED:  Medications  labetalol (NORMODYNE,TRANDATE) injection 5 mg (5 mg Intravenous Given 03/02/15 1205)   nicardipine (CARDENE) 20mg  in 0.86% saline IV infusion (0.1 mg/ml) (10 mg/hr Intravenous Rate/Dose Change 03/02/15 1602)  iohexol (OMNIPAQUE) 350 MG/ML injection 80 mL (80 mLs Intravenous Contrast Given 03/02/15 1446)    New Prescriptions   No medications on file   Filed Vitals:   03/02/15 1400 03/02/15 1415 03/02/15 1430 03/02/15 1530  BP: 195/90 177/83 175/81 193/84  Pulse: 74 71 72 73  Temp:      Resp: 19 14 12 11   Height:      Weight:      SpO2: 98% 97% 97% 100%   CRITICAL CARE Performed by: Sharlene Motts   Total critical care time: 35  Critical care time was exclusive of separately billable procedures and treating other patients.  Critical care was necessary to treat or prevent imminent or life-threatening deterioration.  Critical care was time spent personally by me on the following activities: development of treatment plan with patient and/or surrogate as well as nursing, discussions with consultants, evaluation of patient's response to treatment, examination of patient, obtaining history from patient or surrogate, ordering and performing treatments and interventions, ordering and review of laboratory studies, ordering and review of radiographic studies, pulse oximetry and re-evaluation of patient's condition.  MDM  Patient presents today for evaluation of sudden onset headache. Reports his headache as almost completely gone away in the ED. Blood pressure has been 210s/110s since arrival.  Patient has no focal neurological deficits on exam. H is at baseline per family in the room. Found to have diffuse subarachnoid hemorrhage on CT scan. Discussed with Dr. Cyril Mourning recommends CTA as well as consult to neurosurgery. Also confirms it is reasonable to reduce blood pressure to systolic 160s. Patient started on nicardipine drip. Consult neurosurgery, Dr. Lovell Sheehan agrees with CTA will confer regarding potential coil for aneurysm.  3:57pm: Radiology confirms results of  CTA as aneurysm of right MCA 7 mm x 5 mm. Spoke with neurosurgery again, will admit. Prior to patient admission, I discussed and reviewed this case with my attending, Dr. Effie Shy who also saw and evaluated the patient. Final diagnoses:  SAH (subarachnoid hemorrhage)       Joycie Peek, PA-C 03/02/15 1735  Mancel Bale, MD 03/06/15 1008

## 2015-03-02 NOTE — Op Note (Signed)
DIAGNOSTIC CEREBRAL ANGIOGRAM    OPERATOR:   Dr. Lisbeth Renshaw, MD  HISTORY:   The patient is a 49 y.o. yo male who initially presented to the hospital with about 2 days of headache. Headache was severe enough to prevent him from eating or sleeping. CT scan done in the emergency department demonstrated subarachnoid hemorrhage, and follow-up CT angiogram demonstrated the possibility of a right middle cerebral artery aneurysm. The patient presents for further workup with diagnostic cerebral angiogram and possible aneurysm coiling.  APPROACH:   The technical aspects of the procedure as well as its potential risks and benefits were reviewed with the patient. These risks included but were not limited bleeding, infection, allergic reaction, damage to organs/vital structures, stroke, non-diagnostic procedure, and the catastrophic outcomes of heart attack, coma, and death. With an understanding of these risks, informed consent was obtained and witnessed.    The procedure was performed under general anesthesia, monitored by that service. During intubation, the patient became somewhat combative, with movements which appeared to be posturing.  The patient was placed in the supine position on the angiography table and the skin of right groin prepped in the usual sterile fashion. The procedure was performed under general anesthesia.  A 5- French sheath was introduced in the right common femoral artery using Seldinger technique.  A fluorophase sequence was used to document the sheath position.    HEPARIN: 0 Units total.   CONTRAST AGENT: 80cc, Omnipaque 300   FLUOROSCOPY TIME: 2.5 combined AP and lateral minutes    CATHETER(S) AND WIRE(S):    5-French JB-1 glidecatheter   0.035" glidewire    VESSELS CATHETERIZED:   Right common carotid  Right internal carotid  Left internal carotid   Right vertebral   Left vertebral   Right common femoral  VESSELS STUDIED:   Right common carotid, neck Right  internal carotid, head Right vertebral Left internal carotid, head Left vertebral Right femoral  PROCEDURAL NARRATIVE:   Prior to initiation of the angiographic procedure, Dyna CT was performed to assess for recurrent hemorrhage.  A 5-Fr JB-1 terumo glide catheter was advanced over a 0.035 glidewire into the aortic arch. The above vessels were then sequentially catheterized and cervical/cerebral angiograms taken. After review of images, the catheter was removed without incident.    INTERPRETATION:   DynaCT Head: Reconstructed images do not appear to demonstrate new hemorrhage in comparison to the prior CT scan. Ventricular size is stable.  Right common carotid: neck:   There is no significant stenosis, occlusion, aneurysm or plaque visualized on this injection.    Right internal carotid: head:   Injection reveals the presence of a widely patent ICA, M1, and A1 segments and their branches. There is an aneurysm visualized at the right middle cerebral artery bifurcation which projects laterally and superiorly. The aneurysm had the shape of a "mitten." This measures a proximally 6 mm in maximal dimension, by a proximally 4 mm in width. It is relatively wide-based, arising essentially from the superior division.  The parenchymal and venous phases are normal. The venous sinuses are widely patent.    Left internal carotid: head:   Injection reveals the presence of a widely patent ICA, A1, and M1 segments and their branches. There is no significant stenosis, occlusion, aneurysm, or high flow vascular malformation visualized. The parenchymal and venous phases are normal. The venous sinuses are widely patent.    Left vertebral:   Injection reveals the presence of a widely patent vertebral artery. This leads to a  widely patent basilar artery that terminates in bilateral P1. The basilar apex is normal. There is no significant stenosis, occlusion, aneurysm, or vascular malformation visualized. The  parenchymal and venous phases are normal. The venous sinuses are widely patent.    Right vertebral:    Normal vessel. No PICA aneurysm. Note is made of a fairly large musculocutaneous branch arising from the extracranial vertebral artery. There is no evidence of fistula. See basilar description above.    Right femoral:    Normal vessel. No significant atherosclerotic disease. Arterial sheath in adequate position.   DISPOSITION:  Upon completion of the study, the femoral sheath was removed and hemostasis obtained using a 5-Fr ExoSeal closure device. Good proximal and distal lower extremity pulses were documented upon achievement of hemostasis.    The procedure was well tolerated and no early complications were observed.       The patient was transferred to the operating room for further treatment of his aneurysm.    IMPRESSION:  1. Right middle cerebral artery bifurcation aneurysm is irregular in shape, as described above. 2. No other intracranial aneurysms, arteriovenous malformations, or high flow fistulas are seen. 3. No evidence of vasospasm.  The preliminary results of this procedure were shared with the patient and the patient's family.

## 2015-03-02 NOTE — Progress Notes (Signed)
Chief Complaint:  Chief Complaint  Patient presents with  . Headache    eyes hurt   . Neck Pain  . Hypertension    188/112  . Dizziness    light headed     HPI: Daniel Butler is a 49 y.o. male who is here for  worse headache of his life. He has a headache since yesterday , woke up with the headache, fed his cats, did have ssweat and head felt like it was going to explode , cold compress  appease it greatly and laid down and has not been able to sleep and has only eaten alittle but still has persistent headache. It was 10 out of 10 when he woke up yesterday. However today it is 5 out of 10. He has taken some goody powder. Have a history of headaches. He denies any numbness weakness or tingling. Has pain in the back of his occiput and also behind his eyes. He did have an episode where he felt like he could see the blood vessels in his eyes but then it disappeared. He denies having any floaters or vision. He has been working a lot. Denies nausea vomiting. Denies abdominal pain chest pain shortness of breath or palpitations.  Denies high cholesterol, diabetes. He does have hypertension but has been without medications, He has HTN but has not been on meds and he stopped because he was checking the pressure and was running in the 118/95 and so has been off and he has not checked for several years. Denies slurred speech , asymmetrical weakness  Past Medical History  Diagnosis Date  . Hypertension    No past surgical history on file. History   Social History  . Marital Status: Married    Spouse Name: N/A  . Number of Children: N/A  . Years of Education: N/A   Social History Main Topics  . Smoking status: Current Every Day Smoker -- 1.50 packs/day    Types: Cigarettes  . Smokeless tobacco: Not on file  . Alcohol Use: Not on file  . Drug Use: Not on file  . Sexual Activity: Not on file   Other Topics Concern  . Not on file   Social History Narrative  . No narrative  on file   Family History  Problem Relation Age of Onset  . Adopted: Yes   No Known Allergies Prior to Admission medications   Not on File     ROS: The patient denies fevers, chills, night sweats, unintentional weight loss, chest pain, palpitations, wheezing, dyspnea on exertion, nausea, vomiting, abdominal pain, dysuria, hematuria, melena, numbness, weakness, or tingling.   All other systems have been reviewed and were otherwise negative with the exception of those mentioned in the HPI and as above.    PHYSICAL EXAM: Filed Vitals:   03/02/15 0954  BP: 188/112  Pulse: 79  Temp: 98.3 F (36.8 C)  Resp: 17   Filed Vitals:   03/02/15 0954  Height: 6\' 1"  (1.854 m)  Weight: 235 lb 3.2 oz (106.686 kg)   Body mass index is 31.04 kg/(m^2).   General: Alert, no acute distress HEENT:  Normocephalic, atraumatic, oropharynx patent. EOMI, PERRLA, fundi exam grossly normal Cardiovascular:  Regular rate and rhythm, no rubs murmurs or gallops.  No Carotid bruits, radial pulse intact. No pedal edema.  Respiratory: Clear to auscultation bilaterally.  No wheezes, rales, or rhonchi.  No cyanosis, no use of accessory musculature GI: No organomegaly, abdomen is soft and  non-tender, positive bowel sounds.  No masses. Skin: No rashes. Neurologic: Facial musculature symmetric. CN II-12 grossly normal Psychiatric: Patient is appropriate throughout our interaction. Lymphatic: No cervical lymphadenopathy Musculoskeletal: Gait intact.   LABS: No results found for this or any previous visit.   EKG/XRAY:   Primary read interpreted by Dr. Conley Rolls at [facility].   ASSESSMENT/PLAN: Encounter Diagnoses  Name Primary?  . Acute intractable headache, unspecified headache type Yes  . Dizziness and giddiness   . Essential hypertension   . Neck pain   . Tobacco use disorder    Pleasant 49 year old Caucasian male with past medical history of hypertension who has been without medications for several years  now, tobacco disorder who presents with the worst headache of his life since yesterday. It has been intractable. He does not have a history of headaches. He denies any diabetes or high cholesterol but I don't think that he has been to the doctor in several years. Blood pressures were 188/112 and repeat was 204/120 We'll send to ER for further evaluation by private vehicle,  Neuro exam normal. Need to rule out possible CVA/aneurysm. [facility] charge nurse notified No charge for this visit   Gross sideeffects, risk and benefits, and alternatives of medications d/w patient. Patient is aware that all medications have potential sideeffects and we are unable to predict every sideeffect or drug-drug interaction that may occur.  LE, THAO PHUONG, DO 03/02/2015 10:30 AM

## 2015-03-02 NOTE — H&P (Signed)
CC:  Chief Complaint  Patient presents with  . Headache  . Hypertension    HPI: Daniel Butler is a 49 y.o. male who presents to the ED today after suffering sudden onset of severe HA two days ago while bending over to pick something up while at home. He says initially he placed an icepack on his head, and this somewhat improved the headache. The headache however persisted over the last 2 days, and he therefore decided to seek medical attention. He rates his headache today at a proximally 3/10. He does however state that it prevents him from sleeping or eating. He does not complain of any changes in vision, ringing in the ears, nausea, vomiting, numbness, tingling, or weakness.  Of note, the patient is adopted, and does not have any knowledge of family history. He is a tobacco smoker of a proximally 45 pack years, and has a history of hypertension, although he says he stopped taking his medication deck 8 ago, and was never told that he needed to go back on it.  PMH: Past Medical History  Diagnosis Date  . Hypertension   . High cholesterol   . Gout     PSH: Past Surgical History  Procedure Laterality Date  . Wisdom tooth extraction      SH: History  Substance Use Topics  . Smoking status: Current Every Day Smoker -- 1.50 packs/day    Types: Cigarettes  . Smokeless tobacco: Not on file  . Alcohol Use: 21.0 oz/week    0 Standard drinks or equivalent, 35 Cans of beer per week    MEDS: Prior to Admission medications   Medication Sig Start Date End Date Taking? Authorizing Provider  Aspirin-Acetaminophen-Caffeine (GOODY HEADACHE PO) Take by mouth.   Yes Historical Provider, MD  ibuprofen (ADVIL,MOTRIN) 200 MG tablet Take 200 mg by mouth every 6 (six) hours as needed.   Yes Historical Provider, MD    ALLERGY: No Known Allergies  ROS: ROS  NEUROLOGIC EXAM: Awake, alert, oriented Memory and concentration grossly intact Speech fluent, appropriate CN grossly  intact Motor exam: Upper Extremities Deltoid Bicep Tricep Grip  Right 5/5 5/5 5/5 5/5  Left 5/5 5/5 5/5 5/5   Lower Extremity IP Quad PF DF EHL  Right 5/5 5/5 5/5 5/5 5/5  Left 5/5 5/5 5/5 5/5 5/5   Sensation grossly intact to LT  [facility]: CT of the brain demonstrates a small amount of right sylvian and right convexity subarachnoid hemorrhage, without significant component of basal subarachnoid. There is minimal 3rd ventriclar blood. There is no hydrocephalus.  CT angiogram of the brain was reviewed which demonstrated wide-based right middle cerebral artery aneurysm which appears to incorporate the origin of one of the M2 branches.  IMPRESSION: - 49 y.o. male with a Hunt Hess grade 1, Fisher grade 3/4 subarachnoid hemorrhage likely related to a right middle cerebral artery aneurysm.  PLAN: - Will plan on proceeding with diagnostic cerebral angiogram for further characterization of the aneurysm, and any appropriate treatment.  I spoke at length with the patient and his wife regarding the imaging findings thus far. I explained to them that intracranial aneurysm was the most common non-traumatic cause for [facility] and that the definitive characterization is made by diagnostic angiogram. I also explained to them the possible treatment options for intracranial aneurysms including endovascular coiling and open clip ligation. The risks of the angiogram, coiling, and surgical clipping were also reviewed to include stroke and aneurysm re-rupture leading to weakness/paralysis/coma/death, infection, SZ, hydrocephalus.  The patient and his wife understood our discussion and the provided consent to proceed with diagnostic angiogram and the appropriate treatment for the identified aneurysm.

## 2015-03-02 NOTE — Progress Notes (Addendum)
eLink Physician-Brief Progress Note Patient Name: Daniel Butler DOB: 1966-06-27 MRN: [id number]   Date of Service  03/02/2015  HPI/Events of Note  New arrival from neuro OR  eICU Interventions  On ground provider to see     Intervention Category Intermediate Interventions: Electrolyte abnormality - evaluation and management  MCQUAID, DOUGLAS 03/02/2015, 10:36 PM

## 2015-03-02 NOTE — Op Note (Signed)
PREOP DIAGNOSIS:  1. Subarachnoid Hemorrhage 2. Right Middle Cerebral Artery Aneurysm  POSTOP DIAGNOSIS: Same  PROCEDURE: 1. Right frontotemporal craniotomy for clipping of MCA aneurysm 2. Use of operating microscope for microdissection 3. Use of intraoperative ICG videoangiography  4. Placement of ventriculostomy through separate bur hole  SURGEON: Dr. Lisbeth Renshaw, MD  ASSISTANT: Dr. Tressie Stalker, MD  ANESTHESIA: General Endotracheal  EBL: 500cc  SPECIMENS: None  DRAINS: Right frontal EVD  COMPLICATIONS: None immediate  CONDITION: Hemodynamically stable to ICU  HISTORY: Daniel Butler is a 49 y.o. male who presented to the hospital with subarachnoid hemorrhage. He underwent diagnostic cerebral angiogram which demonstrated the right middle cerebral artery aneurysm as the likely source of hemorrhage. This was felt to be better treated surgically, and he therefore presents for surgical clip ligation. The risks and benefits of the surgery were explained in detail to the patient and his family prior to angiogram. After all questions were answered, informed consent was obtained.  PROCEDURE IN DETAIL: The patient was brought to the operating room via stretcher. After the Mayfield head holder was applied to the patient, he was positioned on the operative table in the supine position. All pressure points were meticulously padded. Skin incision was then marked out and prepped and draped in the usual sterile fashion.  After time-out was conducted, skin incision was infiltrated with local and aesthetic without epinephrine, and skin incision was made sharply and Bovie electrocautery was used to dissect the subcutaneous tissue and galea. Raney clips were then used to secure hemostasis on the skin edges. The superficial temporal artery was dissected free and retracted with the skin flap. Bovie electrocautery was used to dissect through the pericranium as well as the temporalis  fascia and muscle. An interfascial dissection was then conducted, the skin flap was retracted anteriorly, and the temporalis muscle was incised and retracted inferiorly. Bur holes were then created in the pterion, above the root of the zygoma, and the superior temporal line. These are then connected with the craniotome and a standard pterional craniotomy flap was elevated. Hemostasis was achieved on the bone edges, and a high-speed drill was used to drill down the lesser wing of the sphenoid.  The dura was then opened in curvilinear fashion and good hemostasis was achieved on the dural edges. At this point the microscope was draped and brought into the field and the remainder of the case was done under the microscope using microdissection.  After opening the dura, the brain was noted to be fairly tense, and the decision was therefore made to further relaxation by placement of a ventriculostomy. A burr hole was created near Kocher's point. The dura was then coagulated, and a ventriculostomy catheter was passed in one attempt to a depth of approximately 6 cm at the bone, with good return of blood tinged CSF. This was then drained slowly throughout the remainder of the case. It did provide significant brain relaxation.  Under the microscope, the sylvian fissure was meticulously dissected, until the distal MCA branches were identified. We then turned attention to dissection of the medial sylvian fissure initially by opening the optical carotid recesses, identifying the internal carotid artery, and identifying the proximal M1 and tracing it into the proximal sylvian fissure. In this way, a location for temporary clipping of the M1 was identified. Further dissection identified the MCA bifurcation, and the neck of the aneurysm. Using a combination of microdissectors, the neck of the aneurysm was dissected, and the origin of both M2 branches  was identified.  At this point, a straight mini Yasargil titanium clip was  placed across the neck of the aneurysm. Further dissection of across the dome of the aneurysm did identify the point of rupture, and the aneurysm was deflated.  Having completed clipping, the ICG was injected by the anesthesia team, and under infrared camera, patency of the M1, and both M2 branches was confirmed.  At this point the wound is irrigated with copious amounts of normal saline irrigation. Good hemostasis was confirmed on the brain surface. The dura was then approximated using a combination of interrupted  4-0 Nurolon stitches. A piece of dura matrix onlay was then placed over the dural surface suture line. The bone flap was then replaced with standard titanium plates and screws. Muscle was then closed using interrupted 0 Vicryl stitches, and the galea was closed using interrupted 3-0 Vicryl sutures. The skin was closed using standard surgical skin staples. Sterile dressing was then applied after the Mayfield head holder was removed. The patient was then transferred to the stretcher and taken to the intensive care unit in stable hemodynamic condition.  At the end of the case all sponge, needle, cottonoid, and instrument counts were correct.

## 2015-03-02 NOTE — ED Notes (Signed)
[place], [place] made aware that BP 200/103.

## 2015-03-02 NOTE — Anesthesia Preprocedure Evaluation (Addendum)
Anesthesia Evaluation  Patient identified by MRN, date of birth, ID band Patient awake    Reviewed: Allergy & Precautions, NPO status , Patient's Chart, lab work & pertinent test results  Airway Mallampati: II  TM Distance: >3 FB Neck ROM: Full  Mouth opening: Limited Mouth Opening  Dental  (+) Teeth Intact, Poor Dentition, Chipped, Missing   Pulmonary Current Smoker,  breath sounds clear to auscultation        Cardiovascular hypertension, Rhythm:Regular Rate:Tachycardia  Likely noncompliant on his BP meds   Neuro/Psych Headache and SAH    GI/Hepatic   Endo/Other    Renal/GU      Musculoskeletal   Abdominal (+) + obese,   Peds  Hematology   Anesthesia Other Findings   Reproductive/Obstetrics                          Anesthesia Physical Anesthesia Plan  ASA: III and emergent  Anesthesia Plan: General   Post-op Pain Management:    Induction: Intravenous  Airway Management Planned: Oral ETT  Additional Equipment: Arterial line  Intra-op Plan:   Post-operative Plan: Extubation in OR  Informed Consent: I have reviewed the patients History and Physical, chart, labs and discussed the procedure including the risks, benefits and alternatives for the proposed anesthesia with the patient or authorized representative who has indicated his/her understanding and acceptance.   Dental advisory given  Plan Discussed with: CRNA and Surgeon  Anesthesia Plan Comments:         Anesthesia Quick Evaluation

## 2015-03-02 NOTE — ED Notes (Signed)
Neurology at bedside.

## 2015-03-02 NOTE — ED Provider Notes (Signed)
  Face-to-face evaluation   History: Headache which started yesterday morning after he got up. Headache was initially severe but spontaneously improved by 70%. No dizziness, nausea, vomiting, weakness.   Physical exam: Alert, calm, cooperative. No dysarthria, aphasia or nystagmus. Heart regular rate and rhythm, no murmur. Lungs clear to auscultation.    Medical screening examination/treatment/procedure(s) were conducted as a shared visit with non-physician practitioner(s) and myself.  I personally evaluated the patient during the encounter  Mancel Bale, MD 03/06/15 1007

## 2015-03-02 NOTE — Anesthesia Postprocedure Evaluation (Signed)
  Anesthesia Post-op Note  Patient: Daniel Butler  Procedure(s) Performed: Procedure(s): CRANIOTOMY INTRACRANIAL  ANEURYSM FOR CLIPPING (Right)  Patient Location: Neuro Surg ICU  Anesthesia Type:General  Level of Consciousness: sedated and Patient remains intubated per anesthesia plan  Airway and Oxygen Therapy: Patient remains intubated per anesthesia plan and Patient placed on Ventilator (see vital sign flow sheet for setting)  Post-op Pain: none  Post-op Assessment: Post-op Vital signs reviewed, Patient's Cardiovascular Status Stable, Respiratory Function Stable, Patent Airway, No signs of Nausea or vomiting and Pain level controlled              Post-op Vital Signs: stable  Last Vitals:  Filed Vitals:   03/02/15 1530  BP: 193/84  Pulse: 73  Temp:   Resp: 11    Complications: No apparent anesthesia complications

## 2015-03-02 NOTE — Consult Note (Signed)
PULMONARY / CRITICAL CARE MEDICINE   Name: Daniel Butler MRN: [id number] DOB: Aug 21, 1966    ADMISSION DATE:  03/02/2015 CONSULTATION DATE:  03/02/2015  REFERRING MD :  Conchita Paris  CHIEF COMPLAINT:  VDRF following clipping of R MCA aneurysm.  INITIAL PRESENTATION:  49 y.o. 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  Underwent clipping and placement of ventriculostomy, then returned to ICU on the vent.  PCCM was called for vent management.    STUDIES:  CT head 6/23 >>> diffuse SAH with intraventricular hemorrhage and ventricular dilatation. 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  Aneurysm measures 7.4 x 4.35mm.  No significant vasospasm.  SIGNIFICANT EVENTS: 6/23 - admitted with [facility] due to rupture of R MCA aneurysm.  Taken to OR for clipping and ventriculostomy.   HISTORY OF PRESENT ILLNESS:  Pt is encephalopathic; therefore, this HPI is obtained from chart review. Daniel Butler is a 49 y.o. M with PMH of HTN, HLD, gout, who presented to [facility] ED 6/23 with sudden onset of severe HA 2 days prior while bending over to pick something up at his home.  He tried to place an icepack on his head which improved symptoms somewhat but not fully.  Symptoms persisted for the past 2 days therefore he decided to come to ED for further evaluation.  Pain has been severe enough at times to prevent him from sleeping or eating.  He had not experienced any vision changes, chest pain, SOB, N/V, weakness.  He does smoke with ~ 45 pack year hx.  He apparently stopped his antihypertensives 8 days ago (unclear reasons) but informed admitting MD that he was never told that needed to continue with them.  CT of the brain revealed a small SAH with minimal 3rd ventricle blood.  There was no hydrocephalus.  He was evaluated by Dr. Conchita Paris of neurosurgery.  He was taken to the OR and had a right frontotemporal craniotomy for clipping of R MCA aneurysm and placement of a ventriculostomy  through a bur hole.  He returned to the ICU on the ventilator and PCCM was consulted for vent management.    PAST MEDICAL HISTORY :   has a past medical history of Hypertension; High cholesterol; and Gout.  has past surgical history that includes Wisdom tooth extraction. Prior to Admission medications   Medication Sig Start Date End Date Taking? Authorizing Provider  Aspirin-Acetaminophen-Caffeine (GOODY HEADACHE PO) Take by mouth.   Yes Historical Provider, MD  ibuprofen (ADVIL,MOTRIN) 200 MG tablet Take 200 mg by mouth every 6 (six) hours as needed.   Yes Historical Provider, MD   No Known Allergies  FAMILY HISTORY:  Family History  Problem Relation Age of Onset  . Adopted: Yes    SOCIAL HISTORY:  reports that he has been smoking Cigarettes.  He has been smoking about 1.50 packs per day. He does not have any smokeless tobacco history on file. He reports that he drinks about 21.0 oz of alcohol per week. He reports that he does not use illicit drugs.  REVIEW OF SYSTEMS:  Unable to obtain as pt is encephalopathic.  SUBJECTIVE: vented, openes eyes, follows commands  VITAL SIGNS: Temp:  [98.2 F (36.8 C)-98.3 F (36.8 C)] 98.2 F (36.8 C) (06/23 1100) Pulse Rate:  [67-81] 73 (06/23 1530) Resp:  [11-21] 11 (06/23 1530) BP: (175-224)/(81-121) 193/84 mmHg (06/23 1530) SpO2:  [94 %-100 %] 100 % (06/23 1530) FiO2 (%):  [40 %] 40 % (  06/23 2150) Weight:  [106.595 kg (235 lb)-106.686 kg (235 lb 3.2 oz)] 106.595 kg (235 lb) (06/23 1100) HEMODYNAMICS:   VENTILATOR SETTINGS: Vent Mode:  [-] PRVC FiO2 (%):  [40 %] 40 % Set Rate:  [14 bmp] 14 bmp Vt Set:  [500 mL] 500 mL PEEP:  [5 cmH20] 5 cmH20 Plateau Pressure:  [14 cmH20] 14 cmH20 INTAKE / OUTPUT: Intake/Output      06/23 0701 - 06/24 0700   I.V. (mL/kg) 2300 (21.6)   Total Intake(mL/kg) 2300 (21.6)   Urine (mL/kg/hr) 450   Blood 175   Total Output 625   Net +1675         PHYSICAL EXAMINATION: General: Young male, in  NAD. Neuro: Sedated,  follow commands. HEENT: Skull with dressings C/D/I.  Ventriculostomy drain in place. Cardiovascular: RRR, no M/R/G.  Lungs: Respirations even and unlabored.  CTA bilaterally, No W/R/R. Abdomen: BS x 4, soft, NT/ND.  Musculoskeletal: No gross deformities, no edema.  Skin: Intact, warm, no rashes.  LABS:  CBC  Recent Labs Lab 03/02/15 1206  WBC 9.5  HGB 14.3  HCT 40.9  PLT 260   Coag's No results for input(s): APTT, INR in the last 168 hours. BMET  Recent Labs Lab 03/02/15 1206  NA 139  K 3.7  CL 106  CO2 25  BUN 11  CREATININE 0.79  GLUCOSE 107*   Electrolytes  Recent Labs Lab 03/02/15 1206  CALCIUM 9.2   Sepsis Markers No results for input(s): LATICACIDVEN, PROCALCITON, O2SATVEN in the last 168 hours. ABG No results for input(s): PHART, PCO2ART, PO2ART in the last 168 hours. Liver Enzymes No results for input(s): AST, ALT, ALKPHOS, BILITOT, ALBUMIN in the last 168 hours. Cardiac Enzymes No results for input(s): TROPONINI, PROBNP in the last 168 hours. Glucose No results for input(s): GLUCAP in the last 168 hours.  Imaging Ct Angio Head W/cm &/or Wo Cm  03/02/2015   ADDENDUM REPORT: 03/02/2015 16:46  ADDENDUM: Critical Value/emergent results were called by telephone at the time of interpretation on 03/02/2015 at 3:20 to [facility] PA and Gray Bernhardt MD, who verbally acknowledged these results.   Electronically Signed   By: Marlan Palau M.D.   On: 03/02/2015 16:46   03/02/2015   CLINICAL DATA:  Subarachnoid hemorrhage  EXAM: CT ANGIOGRAPHY HEAD  TECHNIQUE: Multidetector CT imaging of the head was performed using the standard protocol during bolus administration of intravenous contrast. Multiplanar CT image reconstructions and MIPs were obtained to evaluate the vascular anatomy.  CONTRAST:  48mL OMNIPAQUE IOHEXOL 350 MG/ML SOLN  COMPARISON:  CT head from today  FINDINGS: CT HEAD  Brain: Subarachnoid hemorrhage, right greater than  left. Mild intraventricular hemorrhage with early hydrocephalus. No acute ischemic infarct.  Calvarium and skull base:  negative  Paranasal sinuses:  mild mucosal edema in the paranasal sinuses.  Orbits: Negative  CTA HEAD  Anterior circulation: Cavernous carotid widely patent bilaterally. Anterior and middle cerebral arteries are patent bilaterally without significant stenosis.  Aneurysm of the right middle cerebral artery measures 7.4 x 4.8 mm. The aneurysm projects superiorly and laterally from the right middle cerebral artery at a branch point. There is a small temporal lobe branch arising from the M1 segment at the base of the aneurysm. No other aneurysm in the anterior circulation.  Posterior circulation: Both vertebral arteries are patent to the basilar. Left vertebral patent. PICA patent bilaterally. Basilar widely patent. Superior cerebellar and posterior cerebral arteries widely patent bilaterally without stenosis or aneurysm.  Venous  sinuses: Patent  Anatomic variants: None  Delayed phase:No enhancing mass lesion.  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 The aneurysm measures 7.4 x 4.8 mm. No significant vasospasm or vessel occlusion. No other aneurysm.  Early hydrocephalus.  Electronically Signed: By: Marlan Palau M.D. On: 03/02/2015 15:48   Ct Head Wo Contrast  03/02/2015   CLINICAL DATA:  Two day history of severe posterior headache.  EXAM: CT HEAD WITHOUT CONTRAST  TECHNIQUE: Contiguous axial images were obtained from the base of the skull through the vertex without intravenous contrast.  COMPARISON:  None.  FINDINGS: There is fairly diffuse subarachnoid hemorrhage noted with a preponderance of blood in the right sylvian fissure and right parietal lobe. This is likely due to an aneurysm. There is also intraventricular hemorrhage noted in the left frontal horn, third ventricle and layering in the occipital horns. A small  amount of blood may also be in the fourth ventricle. The ventricles are enlarged for the patient's age. The gray-white differentiation is maintained. The globes are intact. No subdural or epidural hematoma.  The bony structures are intact.  There is scattered sinus disease.  IMPRESSION: Diffuse subarachnoid hemorrhage with a preponderance of blood in the right sylvian fissure and right parietal lobe which may suggest an MCA aneurysm rupture. There is also intraventricular hemorrhage and ventricular dilatation.  These results were called by telephone at the time of interpretation on 03/02/2015 at 12:46 pm to Dr. Effie Shy, who verbally acknowledged these results.   Electronically Signed   By: Rudie Meyer M.D.   On: 03/02/2015 12:45     ASSESSMENT / PLAN:   NEUROLOGIC A:   SAH with IVH due to ruptured R MCA aneurysm - s/p clipping and ventriculostomy 6/23 (Dr. Conchita Paris). Acute encephalopathy due to above + sedation. P:   Post op care per neurosurgery. Sedation:  Propofol gtt / fentanyl PRN. RASS goal: 0 to -1. Daily WUA. Decadron, Keppra, Nimodipine.  PULMONARY OETT 6/23 >>> A: VDRF following SAH with clipping of ruptured right MCA aneurysm. Tobacco use disorder. DIFFICULT AIRWAY PER ANESTHESIA P:   Full mechanical support, wean as able.   ABG noted - increase MV for resp acidosis. VAP bundle. SBT in AM if able - caution with extubation given known difficult airway. CXR in AM. Tobacco cessation counseling.  CARDIOVASCULAR A:  Hypertensive emergency - with subsequent SAH. Hx HTN, HLD. P:  Goal SBP < 160. Cardene gtt PRN, Labetalol PRN.  RENAL A:   No acute issues. P:   NS @ 75. BMP in AM.  GASTROINTESTINAL A:   GI prophylaxis. Nutrition. P:   SUP: Pantoprazole. NPO.  HEMATOLOGIC A:   VTE Prophylaxis. P:  SCD's. CBC in AM.  INFECTIOUS A:   No indication of infection. P:   Monitor clinically.  ENDOCRINE A:   No acute issues. P:   Monitor glucose on  BMP.  Family updated: None.  Interdisciplinary Family Meeting v Palliative Care Meeting:  Due by: 6/30.  CC time:  35 minutes.   Rutherford Guys, [place] [facility] Pager: [phone]  or [phone] 03/02/2015, 10:47 PM   STAFF NOTE: I, Rory Percy, MD FACP have personally reviewed patient's available data, including medical history, events of note, physical examination and test results as part of my evaluation. I have discussed with resident/NP and other care providers such as pharmacist, RN and RRT. In addition, I personally evaluated patient  and elicited key findings [id number], follows commands on vent, all images reviewed, CTA lungs, noted difficult airway, will have glide, bronch available, ABg reviewed, rate increased repeat ABG, then likely to SBT cpap5 ps5, goal 30 min , nimodipine,SAH protocol, day 3 TCD, decadron likely can reduce, per NS, avoid acidosis, pos balance goals, nicaripine PRN ordered if BP rises, TG back over 1k , if not extubated will need to dc prop and use alternative agent, IVC per NS, goal to extubate today, follow secretions   The patient is critically ill with multiple organ systems failure and requires high complexity decision making for assessment and support, frequent evaluation and titration of therapies, application of advanced monitoring technologies and extensive interpretation of multiple databases.   Critical Care Time devoted to patient care services described in this note is 30 Minutes. This time reflects time of care of this signee: Rory Percy, MD FACP. This critical care time does not reflect procedure time, or teaching time or supervisory time of PA/NP/Med student/Med Resident etc but could involve care discussion time. Rest per NP/medical resident whose note is outlined above and that I agree with   Mcarthur Rossetti. Tyson Alias, MD, FACP Pgr: [phone] [place] 03/03/2015 6:21  AM

## 2015-03-02 NOTE — ED Notes (Signed)
Pt arrived to ED with wife from UC. Pt c/o headache that started yesterday morning. Stated that when he placed an ice pack on the top of his head the pain almost subsided. Pt has not slept or ate anything in 2 days d/t pain. UC transferred pt to ED to have CT scan of head done. Denies any dizziness, nausea, vomiting or changes in vision. Pt is also hypertensive upon arrival and stated that years ago he was on a BP medication but was taken off of it. Stated that he has not had it in 15 years.

## 2015-03-02 NOTE — Anesthesia Procedure Notes (Signed)
Procedure Name: Intubation Date/Time: 03/02/2015 4:32 PM Performed by: Jefm Miles E Pre-anesthesia Checklist: Patient identified, Emergency Drugs available, Suction available, Patient being monitored and Timeout performed Patient Re-evaluated:Patient Re-evaluated prior to inductionOxygen Delivery Method: Circle system utilized Preoxygenation: Pre-oxygenation with 100% oxygen Intubation Type: IV induction and Rapid sequence Ventilation: Two handed mask ventilation required and Mask ventilation with difficulty Laryngoscope Size: Miller and 2 Grade View: Grade II Tube type: Subglottic suction tube Tube size: 8.0 mm Number of attempts: 2 Airway Equipment and Method: Stylet Placement Confirmation: ETT inserted through vocal cords under direct vision,  positive ETCO2 and breath sounds checked- equal and bilateral Secured at: 23 cm Tube secured with: Tape Dental Injury: Teeth and Oropharynx as per pre-operative assessment and Injury to lip  Difficulty Due To: Difficulty was unanticipated and Difficult Airway- due to anterior larynx Future Recommendations: Recommend- induction with short-acting agent, and alternative techniques readily available Comments: DL x 1 by CRNA with Mac 3, Grade III view, unable to insert ETT, DL x 1 by anesthesiologist, Grade II view with Mil 2.

## 2015-03-02 NOTE — Anesthesia Preprocedure Evaluation (Addendum)
Anesthesia Evaluation  Patient identified by MRN, date of birth, ID band Patient awake    Reviewed: Allergy & Precautions, NPO status , Patient's Chart, lab work & pertinent test results  Airway Mallampati: II  TM Distance: >3 FB Neck ROM: Full  Mouth opening: Limited Mouth Opening  Dental  (+) Teeth Intact, Poor Dentition, Chipped, Missing   Pulmonary Current Smoker,  breath sounds clear to auscultation        Cardiovascular hypertension, Rhythm:Regular Rate:Tachycardia  Likely noncompliant on his BP meds   Neuro/Psych Headache and SAH    GI/Hepatic   Endo/Other    Renal/GU      Musculoskeletal   Abdominal (+) + obese,   Peds  Hematology   Anesthesia Other Findings   Reproductive/Obstetrics                             Anesthesia Physical Anesthesia Plan  ASA: III  Anesthesia Plan: General   Post-op Pain Management:    Induction:   Airway Management Planned:   Additional Equipment:   Intra-op Plan:   Post-operative Plan:   Informed Consent:   Plan Discussed with:   Anesthesia Plan Comments:         Anesthesia Quick Evaluation

## 2015-03-02 NOTE — ED Notes (Signed)
Pt remains in CT

## 2015-03-02 NOTE — Transfer of Care (Signed)
Immediate Anesthesia Transfer of Care Note  Patient: Daniel Butler  Procedure(s) Performed: Procedure(s): CRANIOTOMY INTRACRANIAL  ANEURYSM FOR CLIPPING (Right)  Patient Location: NICU  Anesthesia Type:General  Level of Consciousness: Patient remains intubated per anesthesia plan  Airway & Oxygen Therapy: Patient remains intubated per anesthesia plan and Patient placed on Ventilator (see vital sign flow sheet for setting)  Post-op Assessment: Report given to RN and Post -op Vital signs reviewed and stable  Post vital signs: Reviewed and stable  Last Vitals:  Filed Vitals:   03/02/15 1530  BP: 193/84  Pulse: 73  Temp:   Resp: 11    Complications: No apparent anesthesia complications

## 2015-03-02 NOTE — ED Notes (Signed)
Neurosurgeon states he will put in bed request.

## 2015-03-02 NOTE — Transfer of Care (Signed)
Immediate Anesthesia Transfer of Care Note  Patient: Daniel Butler  Procedure(s) Performed: Procedure(s): RADIOLOGY WITH ANESTHESIA (N/A)  Patient Location: OR 34  Anesthesia Type:General  Level of Consciousness: Patient remains intubated per anesthesia plan  Airway & Oxygen Therapy: Patient remains intubated per anesthesia plan and Patient placed on Ventilator (see vital sign flow sheet for setting)  Post-op Assessment: Report given to RN  Post vital signs: Reviewed and stable  Last Vitals:  Filed Vitals:   03/02/15 1530  BP: 193/84  Pulse: 73  Temp:   Resp: 11    Complications: No apparent anesthesia complications

## 2015-03-03 ENCOUNTER — Inpatient Hospital Stay (HOSPITAL_COMMUNITY): Payer: Self-pay

## 2015-03-03 ENCOUNTER — Encounter (HOSPITAL_COMMUNITY): Payer: Self-pay | Admitting: Neurosurgery

## 2015-03-03 DIAGNOSIS — I739 Peripheral vascular disease, unspecified: Secondary | ICD-10-CM

## 2015-03-03 DIAGNOSIS — Z4659 Encounter for fitting and adjustment of other gastrointestinal appliance and device: Secondary | ICD-10-CM | POA: Insufficient documentation

## 2015-03-03 DIAGNOSIS — J9601 Acute respiratory failure with hypoxia: Secondary | ICD-10-CM

## 2015-03-03 LAB — BLOOD GAS, ARTERIAL
Acid-base deficit: 3.6 mmol/L — ABNORMAL HIGH (ref 0.0–2.0)
Bicarbonate: 21.6 mEq/L (ref 20.0–24.0)
Drawn by: 42624
FIO2: 0.4 %
O2 Saturation: 97.3 %
PATIENT TEMPERATURE: 98.6
PCO2 ART: 43.6 mmHg (ref 35.0–45.0)
PEEP/CPAP: 5 cmH2O
PH ART: 7.316 — AB (ref 7.350–7.450)
RATE: 18 resp/min
TCO2: 22.9 mmol/L (ref 0–100)
VT: 500 mL
pO2, Arterial: 94.6 mmHg (ref 80.0–100.0)

## 2015-03-03 LAB — BASIC METABOLIC PANEL
Anion gap: 9 (ref 5–15)
BUN: 14 mg/dL (ref 6–20)
CALCIUM: 7.8 mg/dL — AB (ref 8.9–10.3)
CHLORIDE: 108 mmol/L (ref 101–111)
CO2: 22 mmol/L (ref 22–32)
Creatinine, Ser: 1.05 mg/dL (ref 0.61–1.24)
GFR calc Af Amer: >60 mL/min (ref >60)
GLUCOSE: 149 mg/dL — AB (ref 65–99)
POTASSIUM: 4.1 mmol/L (ref 3.5–5.1)
SODIUM: 139 mmol/L (ref 135–145)

## 2015-03-03 LAB — CBC
HCT: 35.6 % — ABNORMAL LOW (ref 39.0–52.0)
HEMOGLOBIN: 11.8 g/dL — AB (ref 13.0–17.0)
MCH: 31.7 pg (ref 26.0–34.0)
MCHC: 33.1 g/dL (ref 30.0–36.0)
MCV: 95.7 fL (ref 78.0–100.0)
Platelets: 262 10*3/uL (ref 150–400)
RBC: 3.72 MIL/uL — ABNORMAL LOW (ref 4.22–5.81)
RDW: 14 % (ref 11.5–15.5)
WBC: 11.7 10*3/uL — ABNORMAL HIGH (ref 4.0–10.5)

## 2015-03-03 LAB — PHOSPHORUS: Phosphorus: 4.3 mg/dL (ref 2.5–4.6)

## 2015-03-03 LAB — TRIGLYCERIDES: TRIGLYCERIDES: 1088 mg/dL — AB (ref <150)

## 2015-03-03 LAB — MAGNESIUM: MAGNESIUM: 1.9 mg/dL (ref 1.7–2.4)

## 2015-03-03 LAB — MRSA PCR SCREENING: MRSA by PCR: NEGATIVE

## 2015-03-03 MED ORDER — CETYLPYRIDINIUM CHLORIDE 0.05 % MT LIQD
7.0000 mL | Freq: Two times a day (BID) | OROMUCOSAL | Status: DC
  Administered 2015-03-03 – 2015-03-05 (×4): 7 mL via OROMUCOSAL

## 2015-03-03 NOTE — Progress Notes (Signed)
03/03/2015-Respiratory care note-1258 pt suctioned and extubated to 4lpm humidified nasal cannula.  HR77, RR18, Sats 96% on 4lpm nasal cannula.  Pt vocalizing post extubation and had a very strong occasionally productive cough.  Rn at bedside for procedure.  Ambu-bag and ventilator on standby at bedside.  No complications noted at time of extubation. Will follow progress and wean oxygen as tolerated.  s Latoi Giraldo rrt, rcp

## 2015-03-03 NOTE — Care Management Note (Signed)
Case Management Note  Patient Details  Name: Daniel Butler MRN: [id number] Date of Birth: 01/12/1966  Subjective/Objective:   Pt admitted on 03/02/15 with Rt SAH with MCA aneurysm.  PTA, pt independent, lives with spouse.                   Action/Plan: Will follow for dc planning as pt progresses.    Expected Discharge Date:                  Expected Discharge Plan:  Home/Self Care  In-House Referral:     Discharge planning Services  CM Consult  Post Acute Care Choice:    Choice offered to:     DME Arranged:    DME Agency:     HH Arranged:    HH Agency:     Status of Service:  In process, will continue to follow  Medicare Important Message Given:    Date Medicare IM Given:    Medicare IM give by:    Date Additional Medicare IM Given:    Additional Medicare Important Message give by:     If discussed at Long Length of Stay Meetings, dates discussed:    Additional Comments:  Quintella Baton, RN, BSN  Trauma/Neuro ICU Case Manager [phone]

## 2015-03-03 NOTE — Progress Notes (Signed)
eLink Physician-Brief Progress Note Patient Name: Daniel Butler DOB: 1966-03-03 MRN: [id number]   Date of Service  03/03/2015  HPI/Events of Note  Patient with resp acidosis on vent with pH 7.27/29/69/22.  eICU Interventions  Plan: Increase RR on vent from 14 to 18 Recheck ABG in AM     Intervention Category Major Interventions: Acid-Base disturbance - evaluation and management  DETERDING,ELIZABETH 03/03/2015, 12:46 AM

## 2015-03-03 NOTE — Procedures (Signed)
Pt transported from room 3M06 to CT then back to room 3M06 with pt RN, RT, and transporter without complications.

## 2015-03-03 NOTE — Progress Notes (Signed)
Pt seen and examined. No issues overnight. Pt intubated, but nods to questions appropriately.  EXAM: Temp:  [98.7 F (37.1 C)-99.4 F (37.4 C)] 99 F (37.2 C) (06/24 1126) Pulse Rate:  [56-81] 60 (06/24 1100) Resp:  [0-22] 14 (06/24 1100) BP: (115-218)/(56-121) 152/70 mmHg (06/24 1100) SpO2:  [94 %-100 %] 99 % (06/24 1100) Arterial Line BP: (88-136)/(51-81) 107/81 mmHg (06/24 1100) FiO2 (%):  [40 %] 40 % (06/24 1100) Intake/Output      06/23 0701 - 06/24 0700 06/24 0701 - 06/25 0700   I.V. (mL/kg) 3462.2 (32.5) 343.6 (3.2)   NG/GT 60 30   IV Piggyback 205    Total Intake(mL/kg) 3727.2 (35) 373.6 (3.5)   Urine (mL/kg/hr) 1250    Drains 67 31 (0.1)   Blood 175    Total Output 1492 31   Net +2235.2 +342.6         Awake, alert, oriented Intubated, but breathing spontaneously CN grossly intact Moves all extremities to command, 5/5 strength  LABS: Lab Results  Component Value Date   CREATININE 1.05 03/03/2015   BUN 14 03/03/2015   NA 139 03/03/2015   K 4.1 03/03/2015   CL 108 03/03/2015   CO2 22 03/03/2015   Lab Results  Component Value Date   WBC 11.7* 03/03/2015   HGB 11.8* 03/03/2015   HCT 35.6* 03/03/2015   MCV 95.7 03/03/2015   PLT 262 03/03/2015    IMAGING: CTH this am reviewed, no acute changes. EVD in good position, no stroke, no HCP.  IMPRESSION: - 49 y.o. male SAH d# 4 s/p clipping of RMCA aneurysm, doing well  PLAN: - Extubate this am - Can have diet after extubation - Ambulate as tolerated - Cont Nimotop, TCD on MWF

## 2015-03-03 NOTE — Progress Notes (Signed)
PULMONARY / CRITICAL CARE MEDICINE   Name: Daniel DREWEK MRN: [id number] DOB: 1965/10/04    ADMISSION DATE:  03/02/2015 CONSULTATION DATE:  03/03/2015  REFERRING MD :  Conchita Paris  CHIEF COMPLAINT:  VDRF following clipping of R MCA aneurysm.  INITIAL PRESENTATION:  49 y.o. 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  Underwent clipping and placement of ventriculostomy, then returned to ICU on the vent.  PCCM was called for vent management.    STUDIES:  CT head 6/23 >>> diffuse SAH with intraventricular hemorrhage and ventricular dilatation. 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  Aneurysm measures 7.4 x 4.72mm.  No significant vasospasm.  SIGNIFICANT EVENTS: 6/23 - admitted with [facility] due to rupture of R MCA aneurysm.  Taken to OR for clipping and ventriculostomy.   HISTORY OF PRESENT ILLNESS:  Pt is encephalopathic; therefore, this HPI is obtained from chart review. Daniel Butler is a 49 y.o. M with PMH of HTN, HLD, gout, who presented to [facility] ED 6/23 with sudden onset of severe HA 2 days prior while bending over to pick something up at his home.  He tried to place an icepack on his head which improved symptoms somewhat but not fully.  Symptoms persisted for the past 2 days therefore he decided to come to ED for further evaluation.  Pain has been severe enough at times to prevent him from sleeping or eating.  He had not experienced any vision changes, chest pain, SOB, N/V, weakness.  He does smoke with ~ 45 pack year hx.  He apparently stopped his antihypertensives 8 days ago (unclear reasons) but informed admitting MD that he was never told that needed to continue with them.  CT of the brain revealed a small SAH with minimal 3rd ventricle blood.  There was no hydrocephalus.  He was evaluated by Dr. Conchita Paris of neurosurgery.  He was taken to the OR and had a right frontotemporal craniotomy for clipping of R MCA aneurysm and placement of a ventriculostomy  through a bur hole.  He returned to the ICU on the ventilator and PCCM was consulted for vent management.  SUBJECTIVE: vented, openes eyes, follows commands  VITAL SIGNS: Temp:  [98.2 F (36.8 C)-99.4 F (37.4 C)] 99.2 F (37.3 C) (06/24 0807) Pulse Rate:  [56-81] 60 (06/24 0804) Resp:  [9-22] 22 (06/24 0804) BP: (115-224)/(56-121) 134/63 mmHg (06/24 0804) SpO2:  [94 %-100 %] 99 % (06/24 0804) Arterial Line BP: (91-136)/(51-69) 91/58 mmHg (06/24 0700) FiO2 (%):  [40 %] 40 % (06/24 0804) Weight:  [106.595 kg (235 lb)] 106.595 kg (235 lb) (06/23 1100)   HEMODYNAMICS:   VENTILATOR SETTINGS: Vent Mode:  [-] PRVC FiO2 (%):  [40 %] 40 % Set Rate:  [14 bmp-22 bmp] 22 bmp Vt Set:  [500 mL] 500 mL PEEP:  [5 cmH20] 5 cmH20 Plateau Pressure:  [14 cmH20-15 cmH20] 14 cmH20   INTAKE / OUTPUT: Intake/Output      06/23 0701 - 06/24 0700 06/24 0701 - 06/25 0700   I.V. (mL/kg) 3462.2 (32.5)    NG/GT 60    IV Piggyback 205    Total Intake(mL/kg) 3727.2 (35)    Urine (mL/kg/hr) 1250    Drains 67    Blood 175    Total Output 1492     Net +2235.2           PHYSICAL EXAMINATION: General: Young male, in NAD. Neuro: Awake and following commands. HEENT: Skull with  dressings C/D/I.  Ventriculostomy drain in place. Cardiovascular: RRR, no M/R/G.  Lungs: Respirations even and unlabored.  CTA bilaterally, No W/R/R. Abdomen: BS x 4, soft, NT/ND.  Musculoskeletal: No gross deformities, no edema.  Skin: Intact, warm, no rashes.  LABS:  CBC  Recent Labs Lab 03/02/15 1206 03/03/15 0540  WBC 9.5 11.7*  HGB 14.3 11.8*  HCT 40.9 35.6*  PLT 260 262   Coag's No results for input(s): APTT, INR in the last 168 hours. BMET  Recent Labs Lab 03/02/15 1206 03/03/15 0540  NA 139 139  K 3.7 4.1  CL 106 108  CO2 25 22  BUN 11 14  CREATININE 0.79 1.05  GLUCOSE 107* 149*   Electrolytes  Recent Labs Lab 03/02/15 1206 03/03/15 0540  CALCIUM 9.2 7.8*  MG  --  1.9  PHOS  --  4.3    Sepsis Markers No results for input(s): LATICACIDVEN, PROCALCITON, O2SATVEN in the last 168 hours. ABG  Recent Labs Lab 03/02/15 2245 03/03/15 0405  PHART 7.273* 7.316*  PCO2ART 48.9* 43.6  PO2ART 68.8* 94.6   Liver Enzymes No results for input(s): AST, ALT, ALKPHOS, BILITOT, ALBUMIN in the last 168 hours. Cardiac Enzymes No results for input(s): TROPONINI, PROBNP in the last 168 hours. Glucose No results for input(s): GLUCAP in the last 168 hours.  Imaging Ct Angio Head W/cm &/or Wo Cm  03/02/2015   ADDENDUM REPORT: 03/02/2015 16:46  ADDENDUM: Critical Value/emergent results were called by telephone at the time of interpretation on 03/02/2015 at 3:20 to [facility] PA and Gray Bernhardt MD, who verbally acknowledged these results.   Electronically Signed   By: Marlan Palau M.D.   On: 03/02/2015 16:46   03/02/2015   CLINICAL DATA:  Subarachnoid hemorrhage  EXAM: CT ANGIOGRAPHY HEAD  TECHNIQUE: Multidetector CT imaging of the head was performed using the standard protocol during bolus administration of intravenous contrast. Multiplanar CT image reconstructions and MIPs were obtained to evaluate the vascular anatomy.  CONTRAST:  80mL OMNIPAQUE IOHEXOL 350 MG/ML SOLN  COMPARISON:  CT head from today  FINDINGS: CT HEAD  Brain: Subarachnoid hemorrhage, right greater than left. Mild intraventricular hemorrhage with early hydrocephalus. No acute ischemic infarct.  Calvarium and skull base:  negative  Paranasal sinuses:  mild mucosal edema in the paranasal sinuses.  Orbits: Negative  CTA HEAD  Anterior circulation: Cavernous carotid widely patent bilaterally. Anterior and middle cerebral arteries are patent bilaterally without significant stenosis.  Aneurysm of the right middle cerebral artery measures 7.4 x 4.8 mm. The aneurysm projects superiorly and laterally from the right middle cerebral artery at a branch point. There is a small temporal lobe branch arising from the M1 segment at the base  of the aneurysm. No other aneurysm in the anterior circulation.  Posterior circulation: Both vertebral arteries are patent to the basilar. Left vertebral patent. PICA patent bilaterally. Basilar widely patent. Superior cerebellar and posterior cerebral arteries widely patent bilaterally without stenosis or aneurysm.  Venous sinuses: Patent  Anatomic variants: None  Delayed phase:No enhancing mass lesion.  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 The aneurysm measures 7.4 x 4.8 mm. No significant vasospasm or vessel occlusion. No other aneurysm.  Early hydrocephalus.  Electronically Signed: By: Marlan Palau M.D. On: 03/02/2015 15:48   Ct Head Wo Contrast  03/03/2015   CLINICAL DATA:  Ruptured RIGHT middle cerebral artery aneurysm, status post clipping. Stable mental status. History of hypertension.  EXAM: CT HEAD WITHOUT CONTRAST  TECHNIQUE: Contiguous axial images were obtained from the base of the skull through the vertex without intravenous contrast.  COMPARISON:  CT angiogram of the head June 23rd 2016  FINDINGS: Interval RIGHT frontal temporal craniotomy for clipping of RIGHT middle cerebral artery bifurcation aneurysm. Small amount of RIGHT frontal extra-axial pneumocephalus. Moderate amount of subarachnoid blood predominately on the RIGHT, similar to prior CT. Layering blood products within the occipital horns, new RIGHT frontal ventriculostomy catheter with distal tip in RIGHT lateral ventricle, decompressed ventricle size, no residual hydrocephalus. No intraparenchymal hemorrhage. 2 mm RIGHT to LEFT midline shift. Very mildly effaced basal cisterns.  Ocular globes and orbital contents are unremarkable. Mild paranasal sinus mucosal thickening with small LEFT maxillary sinus air-fluid level. The mastoid air cells are well aerated. Life support lines in place.  IMPRESSION: Interval RIGHT frontotemporal craniotomy for clipping of  known MCA aneurysm. No acute large vascular territory infarct.  Mild to moderate residual subarachnoid blood, re- distributed intraventricular blood products. Interval placement of RIGHT frontal ventriculostomy catheter, no residual hydrocephalus.  Mild basal cistern effacement suggests a component of global edema.   Electronically Signed   By: Awilda Metro M.D.   On: 03/03/2015 04:34   Ct Head Wo Contrast  03/02/2015   CLINICAL DATA:  Two day history of severe posterior headache.  EXAM: CT HEAD WITHOUT CONTRAST  TECHNIQUE: Contiguous axial images were obtained from the base of the skull through the vertex without intravenous contrast.  COMPARISON:  None.  FINDINGS: There is fairly diffuse subarachnoid hemorrhage noted with a preponderance of blood in the right sylvian fissure and right parietal lobe. This is likely due to an aneurysm. There is also intraventricular hemorrhage noted in the left frontal horn, third ventricle and layering in the occipital horns. A small amount of blood may also be in the fourth ventricle. The ventricles are enlarged for the patient's age. The gray-white differentiation is maintained. The globes are intact. No subdural or epidural hematoma.  The bony structures are intact.  There is scattered sinus disease.  IMPRESSION: Diffuse subarachnoid hemorrhage with a preponderance of blood in the right sylvian fissure and right parietal lobe which may suggest an MCA aneurysm rupture. There is also intraventricular hemorrhage and ventricular dilatation.  These results were called by telephone at the time of interpretation on 03/02/2015 at 12:46 pm to Dr. Effie Shy, who verbally acknowledged these results.   Electronically Signed   By: Rudie Meyer M.D.   On: 03/02/2015 12:45   Portable Chest Xray  03/03/2015   CLINICAL DATA:  Acute respiratory failure with hypoxia.  EXAM: PORTABLE CHEST - 1 VIEW  COMPARISON:  None.  FINDINGS: Endotracheal tube is 6.5 cm above the carina. NG tube enters  the stomach. No confluent airspace opacities or effusions. Heart is borderline enlarged. No acute bony abnormality.  IMPRESSION: Borderline heart size.  No acute findings.   Electronically Signed   By: Charlett Nose M.D.   On: 03/03/2015 08:13   Dg Abd Portable 1v  03/03/2015   CLINICAL DATA:  Orogastric tube placement.  Initial encounter.  EXAM: PORTABLE ABDOMEN - 1 VIEW  COMPARISON:  None.  FINDINGS: The patient's enteric tube is noted ending overlying the fundus of the stomach, with the side port at the distal esophagus. This could be advanced 5 cm, as deemed clinically appropriate.  The visualized bowel gas pattern is grossly unremarkable. No free intra-abdominal air is seen, though evaluation for free air is limited on a single supine view. No acute osseous  abnormalities are identified.  IMPRESSION: Enteric tube noted ending overlying the fundus of the stomach, with the side port at the distal esophagus. This could be advanced 5 cm, as deemed clinically appropriate.   Electronically Signed   By: Roanna Raider M.D.   On: 03/03/2015 03:19   ASSESSMENT / PLAN:  NEUROLOGIC A:   SAH with IVH due to ruptured R MCA aneurysm - s/p clipping and ventriculostomy 6/23 (Dr. Conchita Paris). Acute encephalopathy due to above + sedation. P:   Post op care per neurosurgery. D/C all sedation. RASS goal: 0 to -1. Daily WUA. Decadron, Keppra, Nimodipine.  PULMONARY OETT 6/23 >>> A: VDRF following SAH with clipping of ruptured right MCA aneurysm. Tobacco use disorder. DIFFICULT AIRWAY PER ANESTHESIA P:   SBT to extubate today. D/C further CXR and ABG Tobacco cessation counseling.  CARDIOVASCULAR A:  Hypertensive emergency - with subsequent SAH. Hx HTN, HLD. P:  Goal SBP < 160. Cardene gtt PRN, Labetalol PRN.  RENAL A:   No acute issues. P:   NS @ 75 until able to take PO. BMP in AM.  GASTROINTESTINAL A:   GI prophylaxis. Nutrition. P:   SUP: Pantoprazole. NPO. Swallow  evaluation.  HEMATOLOGIC A:   VTE Prophylaxis. P:  SCD's. CBC in AM.  INFECTIOUS A:   No indication of infection. P:   Monitor clinically.  ENDOCRINE A:   No acute issues. P:   Monitor glucose on BMP.  Family updated: Wife and patient updated bedside.  The patient is critically ill with multiple organ systems failure and requires high complexity decision making for assessment and support, frequent evaluation and titration of therapies, application of advanced monitoring technologies and extensive interpretation of multiple databases.   Critical Care Time devoted to patient care services described in this note is  35  Minutes. This time reflects time of care of this signee Dr Koren Bound. This critical care time does not reflect procedure time, or teaching time or supervisory time of PA/NP/Med student/Med Resident etc but could involve care discussion time.  Alyson Reedy, M.D. [facility] Pulmonary/Critical Care Medicine. Pager: [phone]. After hours pager: [phone].  03/03/2015 10:31 AM

## 2015-03-03 NOTE — Progress Notes (Signed)
VASCULAR LAB PRELIMINARY  PRELIMINARY  PRELIMINARY  PRELIMINARY  Transcranial Doppler  Date POD PCO2 HCT BP  MCA ACA PCA OPHT SIPH VERT Basilar  6-24- 16 SB      Right  Left   68  69   -70  -64   26  42   23  22   34  29   -26  -36     -39         Right  Left                                            Right  Left                                             Right  Left                                             Right  Left                                            Right  Left                                            Right  Left                                        MCA = Middle Cerebral Artery      OPHT = Opthalmic Artery     BASILAR = Basilar Artery   ACA = Anterior Cerebral Artery     SIPH = Carotid Siphon PCA = Posterior Cerebral Artery   VERT = Verterbral Artery                   Normal MCA = 62+\-12 ACA = 50+\-12 PCA = 42+\-23         Alesia Banda, RVT 03/03/2015, 10:40 AM

## 2015-03-04 LAB — CBC
HEMATOCRIT: 33 % — AB (ref 39.0–52.0)
HEMOGLOBIN: 11.1 g/dL — AB (ref 13.0–17.0)
MCH: 31.6 pg (ref 26.0–34.0)
MCHC: 33.6 g/dL (ref 30.0–36.0)
MCV: 94 fL (ref 78.0–100.0)
Platelets: 233 10*3/uL (ref 150–400)
RBC: 3.51 MIL/uL — AB (ref 4.22–5.81)
RDW: 13.6 % (ref 11.5–15.5)
WBC: 12.1 10*3/uL — AB (ref 4.0–10.5)

## 2015-03-04 LAB — BASIC METABOLIC PANEL
ANION GAP: 10 (ref 5–15)
BUN: 11 mg/dL (ref 6–20)
CO2: 23 mmol/L (ref 22–32)
CREATININE: 0.78 mg/dL (ref 0.61–1.24)
Calcium: 8.4 mg/dL — ABNORMAL LOW (ref 8.9–10.3)
Chloride: 109 mmol/L (ref 101–111)
GFR calc non Af Amer: >60 mL/min (ref >60)
Glucose, Bld: 153 mg/dL — ABNORMAL HIGH (ref 65–99)
POTASSIUM: 3.6 mmol/L (ref 3.5–5.1)
SODIUM: 142 mmol/L (ref 135–145)

## 2015-03-04 LAB — MAGNESIUM: Magnesium: 2.2 mg/dL (ref 1.7–2.4)

## 2015-03-04 LAB — PHOSPHORUS: Phosphorus: 1.8 mg/dL — ABNORMAL LOW (ref 2.5–4.6)

## 2015-03-04 MED ORDER — POTASSIUM CHLORIDE CRYS ER 20 MEQ PO TBCR
40.0000 meq | EXTENDED_RELEASE_TABLET | Freq: Three times a day (TID) | ORAL | Status: AC
  Administered 2015-03-04 (×2): 40 meq via ORAL
  Filled 2015-03-04 (×2): qty 2

## 2015-03-04 MED ORDER — HYDRALAZINE HCL 20 MG/ML IJ SOLN
5.0000 mg | INTRAMUSCULAR | Status: DC | PRN
  Administered 2015-03-05 – 2015-03-08 (×4): 20 mg via INTRAVENOUS
  Filled 2015-03-04 (×4): qty 1

## 2015-03-04 MED ORDER — POTASSIUM CHLORIDE 10 MEQ/100ML IV SOLN
10.0000 meq | INTRAVENOUS | Status: AC
  Administered 2015-03-04 (×2): 10 meq via INTRAVENOUS
  Filled 2015-03-04 (×2): qty 100

## 2015-03-04 MED ORDER — OXYCODONE-ACETAMINOPHEN 5-325 MG PO TABS
1.0000 | ORAL_TABLET | Freq: Four times a day (QID) | ORAL | Status: DC | PRN
  Administered 2015-03-04: 2 via ORAL
  Administered 2015-03-05 – 2015-03-09 (×2): 1 via ORAL
  Administered 2015-03-09: 2 via ORAL
  Administered 2015-03-10 (×2): 1 via ORAL
  Administered 2015-03-11 (×2): 2 via ORAL
  Administered 2015-03-11 (×2): 1 via ORAL
  Filled 2015-03-04 (×4): qty 1
  Filled 2015-03-04 (×2): qty 2
  Filled 2015-03-04: qty 1
  Filled 2015-03-04 (×2): qty 2
  Filled 2015-03-04 (×2): qty 1

## 2015-03-04 MED ORDER — PANTOPRAZOLE SODIUM 40 MG PO TBEC
40.0000 mg | DELAYED_RELEASE_TABLET | Freq: Every day | ORAL | Status: DC
  Administered 2015-03-04 – 2015-03-11 (×8): 40 mg via ORAL
  Filled 2015-03-04 (×9): qty 1

## 2015-03-04 NOTE — Evaluation (Signed)
Clinical/Bedside Swallow Evaluation Patient Details  Name: Daniel Butler MRN: [id number] Date of Birth: February 15, 1966  Today's Date: 03/04/2015 Time: SLP Start Time (ACUTE ONLY): 1010 SLP Stop Time (ACUTE ONLY): 1029 SLP Time Calculation (min) (ACUTE ONLY): 19 min  Past Medical History:  Past Medical History  Diagnosis Date  . Hypertension   . High cholesterol   . Gout    Past Surgical History:  Past Surgical History  Procedure Laterality Date  . Wisdom tooth extraction    . Radiology with anesthesia N/A 03/02/2015    Procedure: RADIOLOGY WITH ANESTHESIA;  Surgeon: Lisbeth Renshaw, MD;  Location: [facility] OR;  Service: Radiology;  Laterality: N/A;  . Craniotomy Right 03/02/2015    Procedure: CRANIOTOMY INTRACRANIAL  ANEURYSM FOR CLIPPING;  Surgeon: Lisbeth Renshaw, MD;  Location: [facility] ORS;  Service: Neurosurgery;  Laterality: Right;   HPI:  Daniel Butler is a 49 y.o. male who presents to the ED today after suffering sudden onset of severe HA two days ago while bending over to pick something up while at home. He says initially he placed an icepack on his head, and this somewhat improved the headache. The headache however persisted over the last 2 days, and he therefore decided to seek medical attention. He rates his headache today at a proximally 3/10. He does however state that it prevents him from sleeping or eating. He does not complain of any changes in vision, ringing in the ears, nausea, vomiting, numbness, tingling, or weakness.  CT head demonstrated the following:  Diffuse subarachnoid hemorrhage with a preponderance of blood in the right sylvian fissure and right parietal lobe which may suggest an MCA aneurysm rupture. There is also intraventricular hemorrhage and   Assessment / Plan / Recommendation Clinical Impression  Clinical swallowing evaluation was completed.  The patient's oral/pharyngeal swallow appeared to be functional.  Oral mechanism exam did not indicate any  issues and the patient did not present with any overt s/s of aspiration.  Screen of language/cognition did not show any overt issues.  Acute ST needs are not identified.  Please re-consult if the patient's status changes.      Aspiration Risk  None    Diet Recommendation  (Regular diet and thin liquids.  )   Medication Administration: Whole meds with liquid    Other  Recommendations Oral Care Recommendations: Oral care BID         Pertinent Vitals/Pain The patient currently reports no pain.      SLP Swallow Goals     Swallow Study Prior Functional Status  Cognitive/Linguistic Baseline: Within functional limits    General Other Pertinent Information: Daniel Butler is a 49 y.o. male who presents to the ED today after suffering sudden onset of severe HA two days ago while bending over to pick something up while at home. He says initially he placed an icepack on his head, and this somewhat improved the headache. The headache however persisted over the last 2 days, and he therefore decided to seek medical attention. He rates his headache today at a proximally 3/10. He does however state that it prevents him from sleeping or eating. He does not complain of any changes in vision, ringing in the ears, nausea, vomiting, numbness, tingling, or weakness.  CT head demonstrated the following:  Diffuse subarachnoid hemorrhage with a preponderance of blood in the right sylvian fissure and right parietal lobe which may suggest an MCA aneurysm rupture. There is also intraventricular hemorrhage and Type of Study: Bedside  swallow evaluation Diet Prior to this Study: Regular;Thin liquids Temperature Spikes Noted: Yes Respiratory Status: Room air History of Recent Intubation: Yes Length of Intubations (days): 2 days Date extubated: 03/03/15 Behavior/Cognition: Alert;Cooperative;Pleasant mood Oral Cavity - Dentition: Poor condition;Missing dentition Self-Feeding Abilities: Able to feed self Patient  Positioning: Upright in bed Baseline Vocal Quality: Normal Volitional Cough: Other (Comment) (Not assessed as it causes the patient's head to hurt.  ) Volitional Swallow: Able to elicit    Oral/Motor/Sensory Function Overall Oral Motor/Sensory Function: Appears within functional limits for tasks assessed Labial ROM: Within Functional Limits Labial Symmetry: Within Functional Limits Labial Strength: Within Functional Limits Lingual ROM: Within Functional Limits Lingual Symmetry: Within Functional Limits Lingual Strength: Within Functional Limits Facial ROM: Within Functional Limits Facial Symmetry: Within Functional Limits Facial Strength: Within Functional Limits Mandible: Within Functional Limits   Ice Chips Ice chips: Not tested   Thin Liquid Thin Liquid: Within functional limits    Nectar Thick Nectar Thick Liquid: Not tested   Honey Thick Honey Thick Liquid: Not tested   Puree Puree: Within functional limits Presentation: Self Fed;Spoon   Solid   GO    Solid: Within functional limits Presentation: Self Daniel Butler, Kashvi Prevette N 03/04/2015,10:36 AM  Dimas Aguas, MA, CCC-SLP Acute Rehab SLP [phone]

## 2015-03-04 NOTE — Progress Notes (Signed)
Pt seen and examined. No issues overnight. Pt reports minimal HA. No complaints.  EXAM: Temp:  [98 F (36.7 C)-99 F (37.2 C)] 99 F (37.2 C) (06/25 0347) Pulse Rate:  [58-87] 67 (06/25 0900) Resp:  [7-24] 24 (06/25 0900) BP: (143-178)/(57-81) 147/61 mmHg (06/25 0900) SpO2:  [92 %-99 %] 92 % (06/25 0900) Arterial Line BP: (105-177)/(87-103) 110/102 mmHg (06/24 1600) FiO2 (%):  [40 %] 40 % (06/24 1300) Intake/Output      06/24 0701 - 06/25 0700 06/25 0701 - 06/26 0700   P.O. 360 120   I.V. (mL/kg) 1843.6 (17.3) 150 (1.4)   NG/GT 60    IV Piggyback 310 100   Total Intake(mL/kg) 2573.6 (24.1) 370 (3.5)   Urine (mL/kg/hr) 1845 (0.7)    Drains 278 (0.1) 9 (0)   Blood     Total Output 2123 9   Net +450.6 +361         Awake, alert, oriented CN intact Moving all ext well EVD in place, open at 10  LABS: Lab Results  Component Value Date   CREATININE 0.78 03/04/2015   BUN 11 03/04/2015   NA 142 03/04/2015   K 3.6 03/04/2015   CL 109 03/04/2015   CO2 23 03/04/2015   Lab Results  Component Value Date   WBC 12.1* 03/04/2015   HGB 11.1* 03/04/2015   HCT 33.0* 03/04/2015   MCV 94.0 03/04/2015   PLT 233 03/04/2015    IMPRESSION: - 49 y.o. male SAH d# 5 s/p RMCA clipping, remains neurologically intact  PLAN: - Cont spasm observation, Nimotop - Can mobilize - Keep EVD, open at 10

## 2015-03-04 NOTE — Progress Notes (Signed)
[facility] ADULT ICU REPLACEMENT PROTOCOL FOR AM LAB REPLACEMENT ONLY  The patient does apply for the [facility] Adult ICU Electrolyte Replacment Protocol based on the criteria listed below:   1. Is GFR >/= 40 ml/min? Yes.    Patient's GFR today is >60 2. Is urine output >/= 0.5 ml/kg/hr for the last 6 hours? Yes.   Patient's UOP is 1.1 ml/kg/hr 3. Is BUN < 60 mg/dL? Yes.    Patient's BUN today is 11 4. Abnormal electrolyte(s): K3.6, Phos1.8 5. Ordered repletion with:per protocol 6. If a panic level lab has been reported, has the CCM MD in charge been notified? Yes.  .   Physician:  Thea Gist 03/04/2015 6:30 AM

## 2015-03-04 NOTE — Progress Notes (Addendum)
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  INITIAL PRESENTATION:  49 y.o. 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  Underwent clipping and placement of ventriculostomy, then returned to ICU on the vent.  PCCM was called for vent management.    STUDIES:  CT head 6/23 >>> diffuse SAH with intraventricular hemorrhage and ventricular dilatation. 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  Aneurysm measures 7.4 x 4.85mm.  No significant vasospasm.  SIGNIFICANT EVENTS: 6/23 - admitted with [facility] due to rupture of R MCA aneurysm.  Taken to OR for clipping and ventriculostomy.   HISTORY OF PRESENT ILLNESS:  Pt is encephalopathic; therefore, this HPI is obtained from chart review. Daniel Butler is a 49 y.o. M with PMH of HTN, HLD, gout, who presented to [facility] ED 6/23 with sudden onset of severe HA 2 days prior while bending over to pick something up at his home.  He tried to place an icepack on his head which improved symptoms somewhat but not fully.  Symptoms persisted for the past 2 days therefore he decided to come to ED for further evaluation.  Pain has been severe enough at times to prevent him from sleeping or eating.  He had not experienced any vision changes, chest pain, SOB, N/V, weakness.  He does smoke with ~ 45 pack year hx.  He apparently stopped his antihypertensives 8 days ago (unclear reasons) but informed admitting MD that he was never told that needed to continue with them.  CT of the brain revealed a small SAH with minimal 3rd ventricle blood.  There was no hydrocephalus.  He was evaluated by Dr. Conchita Paris of neurosurgery.  He was taken to the OR and had a right frontotemporal craniotomy for clipping of R MCA aneurysm and placement of a ventriculostomy  through a bur hole.  He returned to the ICU on the ventilator and PCCM was consulted for vent management.  SUBJECTIVE: vented, openes eyes, follows commands  VITAL SIGNS: Temp:  [98 F (36.7 C)-99 F (37.2 C)] 99 F (37.2 C) (06/25 0347) Pulse Rate:  [58-87] 67 (06/25 0900) Resp:  [7-24] 24 (06/25 0900) BP: (143-178)/(57-81) 147/61 mmHg (06/25 0900) SpO2:  [92 %-99 %] 92 % (06/25 0900) Arterial Line BP: (105-177)/(81-103) 110/102 mmHg (06/24 1600) FiO2 (%):  [40 %] 40 % (06/24 1300)   HEMODYNAMICS:   VENTILATOR SETTINGS: Vent Mode:  [-]  FiO2 (%):  [40 %] 40 %   INTAKE / OUTPUT: Intake/Output      06/24 0701 - 06/25 0700 06/25 0701 - 06/26 0700   P.O. 360 120   I.V. (mL/kg) 1843.6 (17.3) 150 (1.4)   NG/GT 60    IV Piggyback 310 100   Total Intake(mL/kg) 2573.6 (24.1) 370 (3.5)   Urine (mL/kg/hr) 1845 (0.7)    Drains 278 (0.1) 9 (0)   Blood     Total Output 2123 9   Net +450.6 +361         PHYSICAL EXAMINATION: General: Young male, in NAD. Neuro: Awake and following commands. HEENT: Skull with dressings C/D/I.  Ventriculostomy drain in place. Cardiovascular: RRR, no M/R/G.  Lungs: Respirations even and unlabored.  CTA bilaterally, No W/R/R. Abdomen: BS x 4, soft, NT/ND.  Musculoskeletal: No gross deformities,  no edema.  Skin: Intact, warm, no rashes.  LABS:  CBC  Recent Labs Lab 03/02/15 1206 03/03/15 0540 03/04/15 0317  WBC 9.5 11.7* 12.1*  HGB 14.3 11.8* 11.1*  HCT 40.9 35.6* 33.0*  PLT 260 262 233   Coag's No results for input(s): APTT, INR in the last 168 hours. BMET  Recent Labs Lab 03/02/15 1206 03/03/15 0540 03/04/15 0317  NA 139 139 142  K 3.7 4.1 3.6  CL 106 108 109  CO2 BUN CREATININE 0.79 1.05 0.78  GLUCOSE 107* 149* 153*   Electrolytes  Recent Labs Lab 03/02/15 1206 03/03/15 0540 03/04/15 0317  CALCIUM 9.2 7.8* 8.4*  MG  --  1.9 2.2  PHOS  --  4.3 1.8*   Sepsis Markers No results for input(s):  LATICACIDVEN, PROCALCITON, O2SATVEN in the last 168 hours. ABG  Recent Labs Lab 03/02/15 2245 03/03/15 0405  PHART 7.273* 7.316*  PCO2ART 48.9* 43.6  PO2ART 68.8* 94.6   Liver Enzymes No results for input(s): AST, ALT, ALKPHOS, BILITOT, ALBUMIN in the last 168 hours. Cardiac Enzymes No results for input(s): TROPONINI, PROBNP in the last 168 hours. Glucose No results for input(s): GLUCAP in the last 168 hours.  Imaging No results found.  I reviewed CXR myself, no signs of acute disease.  Discussed with speech pathology and bedside RN.  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 Conchita Paris). Acute encephalopathy due to above + sedation. P:   Post op care per neurosurgery. D/C all sedation. Decadron, Keppra, Nimodipine, will defer to neuro. Percocet for pain.  PULMONARY OETT 6/23 >>>6/25 A: VDRF following SAH with clipping of ruptured right MCA aneurysm. Tobacco use disorder. DIFFICULT AIRWAY PER ANESTHESIA P:   Titrate O2 to off. D/C further CXR and ABG Tobacco cessation counseling.  CARDIOVASCULAR A:  Hypertensive emergency - with subsequent SAH. Hx HTN, HLD. P:  Goal SBP < 160. Cardene gtt off Labetalol PRN.  RENAL A:   Hypokalemia P:   KVO IVF. BMP in AM. Replace K.  GASTROINTESTINAL A:   GI prophylaxis. Nutrition. P:   SUP: Pantoprazole. Diet as ordered. Swallow evaluation passed.  HEMATOLOGIC A:   VTE Prophylaxis. P:  SCD's. CBC in AM.  INFECTIOUS A:   No indication of infection. P:   Monitor clinically.  ENDOCRINE A:   No acute issues. P:   Monitor glucose on BMP.  Family updated: Wife and patient updated bedside.  Drain in place.  Remains in ICU for management of the drain, will defer to NS on how long to keep.  Alyson Reedy, M.D. [facility] Pulmonary/Critical Care Medicine. Pager: [phone]. After hours pager: [phone].  03/04/2015  10:33 AM

## 2015-03-04 NOTE — Evaluation (Signed)
Physical Therapy Evaluation Patient Details Name: Daniel Butler MRN: [id number] DOB: 06-Jul-1966 Today's Date: 03/04/2015   History of Present Illness  Patient is a 49 yo male admitted 03/02/15 with severe headache.  Patient with SAH, s/p clipping of Rt MCA aneurysm, with post-op VDRF.  Patient extubated 03/03/15.  PMH:  HTN, Gout  Clinical Impression  Patient presents with problems listed below.  Will benefit from acute PT to maximize functional independence prior to discharge home with family.  Patient with decreased balance and safety awareness, impacting mobility.  Would recommend 24 hour assist initially and f/u HHPT for balance.    Follow Up Recommendations Home health PT;Supervision/Assistance - 24 hour (Initially)    Equipment Recommendations  Other (comment) (TBD)    Recommendations for Other Services       Precautions / Restrictions Precautions Precautions: Fall Precaution Comments: ICP/Ventricular drainage catheter. Restrictions Weight Bearing Restrictions: No      Mobility  Bed Mobility Overal bed mobility: Needs Assistance;+ 2 for safety/equipment Bed Mobility: Supine to Sit;Sit to Supine     Supine to sit: Min assist;+2 for safety/equipment Sit to supine: Min assist;+2 for safety/equipment   General bed mobility comments: Verbal cues for technique.  Reminders to move slowly, and to wait until equipment is ready before moving.    Transfers Overall transfer level: Needs assistance Equipment used: 1 person hand held assist Transfers: Sit to/from Stand Sit to Stand: Min assist;+2 safety/equipment         General transfer comment: Verbal cues for technique and safety.  Assist to steady during transfer.  Ambulation/Gait Ambulation/Gait assistance: Min assist;+2 safety/equipment Ambulation Distance (Feet): 84 Feet Assistive device: 1 person hand held assist Gait Pattern/deviations: Step-through pattern;Decreased stride length;Staggering right;Drifts  right/left Gait velocity: Decreased Gait velocity interpretation: Below normal speed for age/gender General Gait Details: Verbal cues for safety and to move slowly.  Patient easily distracted during gait, looking into other rooms.  Patient leaning to right side during gait, requiring assist to maintain balance.  Limited activity due to headache, and patient beginning to sweat.  Stairs            Wheelchair Mobility    Modified Rankin (Stroke Patients Only) Modified Rankin (Stroke Patients Only) Pre-Morbid Rankin Score: No symptoms Modified Rankin: Moderately severe disability     Balance Overall balance assessment: Needs assistance         Standing balance support: Single extremity supported Standing balance-Leahy Scale: Poor                               Pertinent Vitals/Pain Pain Assessment: 0-10 Pain Score: 2  Pain Location: Head (with mobility) Pain Descriptors / Indicators: Aching;Dull Pain Intervention(s): Limited activity within patient's tolerance    Home Living Family/patient expects to be discharged to:: Private residence Living Arrangements: Spouse/significant other (sister-in-law) Available Help at Discharge: Family;Available PRN/intermittently (Wife and sister-in-law work) Type of Home: House Home Access: Stairs to enter Entrance Stairs-Rails: Right Entrance Stairs-Number of Steps: 2 Home Layout: Two level;Able to live on main level with bedroom/bathroom (Patient reports his cats are in the basement) Home Equipment: Shower seat;Toilet riser;Cane - single point      Prior Function Level of Independence: Independent         Comments: Works at [employer]   Dominant Hand: Right    Extremity/Trunk Assessment   Upper Extremity Assessment: Overall WFL for tasks assessed  Lower Extremity Assessment: Overall WFL for tasks assessed         Communication   Communication: No difficulties   Cognition Arousal/Alertness: Awake/alert Behavior During Therapy: Restless;Impulsive Overall Cognitive Status: Impaired/Different from baseline Area of Impairment: Attention;Safety/judgement   Current Attention Level: Selective (Easily distracted)     Safety/Judgement: Decreased awareness of deficits;Decreased awareness of safety     General Comments: Repeated cues to move slowly - so that we could watch IVC.  Patient moving quickly, with unsteady movement.  When asked if patient would have assist at home, he reports he knows his limitations and will be fine at home alone.  Patient reports he has to go down into basement to see cats, even after we discussed his imbalance and fall risk.    General Comments      Exercises        Assessment/Plan    PT Assessment Patient needs continued PT services  PT Diagnosis Difficulty walking;Abnormality of gait;Acute pain;Altered mental status   PT Problem List Decreased activity tolerance;Decreased balance;Decreased mobility;Decreased cognition;Decreased safety awareness;Pain  PT Treatment Interventions DME instruction;Gait training;Functional mobility training;Therapeutic activities;Balance training;Cognitive remediation;Patient/family education   PT Goals (Current goals can be found in the Care Plan section) Acute Rehab PT Goals Patient Stated Goal: To go home soon PT Goal Formulation: With patient Time For Goal Achievement: 03/11/15 Potential to Achieve Goals: Good    Frequency Min 4X/week   Barriers to discharge Decreased caregiver support Per patient, his wife and sister-in-law work.    Co-evaluation               End of Session   Activity Tolerance: Patient limited by fatigue;Patient limited by pain Patient left: in bed;with call bell/phone within reach;with nursing/sitter in room Nurse Communication: Mobility status         Time: 0347-4259 PT Time Calculation (min) (ACUTE ONLY): 26 min   Charges:   PT  Evaluation $Initial PT Evaluation Tier I: 1 Procedure PT Treatments $Gait Training: 8-22 mins   PT G Codes:        Vena Austria 03-15-2015, 9:30 PM Durenda Hurt. Renaldo Fiddler, [facility] Acute Rehab Services Pager [phone]

## 2015-03-05 LAB — BASIC METABOLIC PANEL
Anion gap: 9 (ref 5–15)
BUN: 15 mg/dL (ref 6–20)
CO2: 22 mmol/L (ref 22–32)
Calcium: 8.8 mg/dL — ABNORMAL LOW (ref 8.9–10.3)
Chloride: 109 mmol/L (ref 101–111)
Creatinine, Ser: 0.91 mg/dL (ref 0.61–1.24)
GFR calc Af Amer: >60 mL/min (ref >60)
GLUCOSE: 135 mg/dL — AB (ref 65–99)
Potassium: 4.4 mmol/L (ref 3.5–5.1)
Sodium: 140 mmol/L (ref 135–145)

## 2015-03-05 LAB — CBC
HCT: 34.2 % — ABNORMAL LOW (ref 39.0–52.0)
HEMOGLOBIN: 11.3 g/dL — AB (ref 13.0–17.0)
MCH: 31.5 pg (ref 26.0–34.0)
MCHC: 33 g/dL (ref 30.0–36.0)
MCV: 95.3 fL (ref 78.0–100.0)
PLATELETS: 252 10*3/uL (ref 150–400)
RBC: 3.59 MIL/uL — AB (ref 4.22–5.81)
RDW: 13.7 % (ref 11.5–15.5)
WBC: 12.2 10*3/uL — ABNORMAL HIGH (ref 4.0–10.5)

## 2015-03-05 LAB — MAGNESIUM: Magnesium: 2.6 mg/dL — ABNORMAL HIGH (ref 1.7–2.4)

## 2015-03-05 LAB — PHOSPHORUS: PHOSPHORUS: 2.1 mg/dL — AB (ref 2.5–4.6)

## 2015-03-05 MED ORDER — DOCUSATE SODIUM 100 MG PO CAPS
100.0000 mg | ORAL_CAPSULE | Freq: Two times a day (BID) | ORAL | Status: DC
  Administered 2015-03-05 – 2015-03-12 (×15): 100 mg via ORAL
  Filled 2015-03-05 (×16): qty 1

## 2015-03-05 MED ORDER — AMLODIPINE BESYLATE 5 MG PO TABS
5.0000 mg | ORAL_TABLET | Freq: Every day | ORAL | Status: DC
  Administered 2015-03-05: 5 mg via ORAL
  Filled 2015-03-05 (×2): qty 1

## 2015-03-05 MED ORDER — POLYETHYLENE GLYCOL 3350 17 G PO PACK
17.0000 g | PACK | Freq: Every day | ORAL | Status: DC | PRN
  Filled 2015-03-05: qty 1

## 2015-03-05 NOTE — Progress Notes (Signed)
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  INITIAL PRESENTATION:  49 y.o. 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  Underwent clipping and placement of ventriculostomy, then returned to ICU on the vent.  PCCM was called for vent management.   STUDIES:  CT head 6/23 >>> diffuse SAH with intraventricular hemorrhage and ventricular dilatation. 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  Aneurysm measures 7.4 x 4.63mm.  No significant vasospasm.  SIGNIFICANT EVENTS: 6/23 - admitted with [facility] due to rupture of R MCA aneurysm.  Taken to OR for clipping and ventriculostomy.  HISTORY OF PRESENT ILLNESS:  Pt is encephalopathic; therefore, this HPI is obtained from chart review. Daniel Butler is a 49 y.o. M with PMH of HTN, HLD, gout, who presented to [facility] ED 6/23 with sudden onset of severe HA 2 days prior while bending over to pick something up at his home.  He tried to place an icepack on his head which improved symptoms somewhat but not fully.  Symptoms persisted for the past 2 days therefore he decided to come to ED for further evaluation.  Pain has been severe enough at times to prevent him from sleeping or eating.  He had not experienced any vision changes, chest pain, SOB, N/V, weakness.  He does smoke with ~ 45 pack year hx.  He apparently stopped his antihypertensives 8 days ago (unclear reasons) but informed admitting MD that he was never told that needed to continue with them.  CT of the brain revealed a small SAH with minimal 3rd ventricle blood.  There was no hydrocephalus.  He was evaluated by Dr. Conchita Paris of neurosurgery.  He was taken to the OR and had a right frontotemporal craniotomy for clipping of R MCA aneurysm and placement of a ventriculostomy through  a bur hole.  He returned to the ICU on the ventilator and PCCM was consulted for vent management.  SUBJECTIVE: Extubated and doing well.  VITAL SIGNS: Temp:  [97.5 F (36.4 C)-98.5 F (36.9 C)] 97.9 F (36.6 C) (06/26 0357) Pulse Rate:  [58-94] 60 (06/26 1000) Resp:  [13-27] 16 (06/26 1000) BP: (142-180)/(62-121) 152/63 mmHg (06/26 1000) SpO2:  [92 %-100 %] 98 % (06/26 1000)   HEMODYNAMICS:   VENTILATOR SETTINGS:     INTAKE / OUTPUT: Intake/Output      06/25 0701 - 06/26 0700 06/26 0701 - 06/27 0700   P.O. 840 240   I.V. (mL/kg) 1800 (16.9) 225 (2.1)   NG/GT     IV Piggyback 310    Total Intake(mL/kg) 2950 (27.7) 465 (4.4)   Urine (mL/kg/hr) 3150 (1.2) 550 (1.4)   Drains 175 (0.1) 14 (0)   Total Output 3325 564   Net -375 -99         PHYSICAL EXAMINATION: General: Young male, in NAD. Neuro: Awake and following commands. HEENT: Skull with dressings C/D/I.  Ventriculostomy drain in place. Cardiovascular: RRR, no M/R/G.  Lungs: Respirations even and unlabored.  CTA bilaterally, No W/R/R. Abdomen: BS x 4, soft, NT/ND.  Musculoskeletal: No gross deformities, no edema.  Skin: Intact, warm, no rashes.  LABS:  CBC  Recent Labs Lab 03/03/15 0540 03/04/15 0317 03/05/15 0210  WBC 11.7* 12.1* 12.2*  HGB 11.8* 11.1* 11.3*  HCT  35.6* 33.0* 34.2*  PLT 262 233 252   Coag's No results for input(s): APTT, INR in the last 168 hours. BMET  Recent Labs Lab 03/03/15 0540 03/04/15 0317 03/05/15 0210  NA 139 142 140  K 4.1 3.6 4.4  CL 108 109 109  CO2 BUN CREATININE 1.05 0.78 0.91  GLUCOSE 149* 153* 135*   Electrolytes  Recent Labs Lab 03/03/15 0540 03/04/15 0317 03/05/15 0210  CALCIUM 7.8* 8.4* 8.8*  MG 1.9 2.2 2.6*  PHOS 4.3 1.8* 2.1*   Sepsis Markers No results for input(s): LATICACIDVEN, PROCALCITON, O2SATVEN in the last 168 hours. ABG  Recent Labs Lab 03/02/15 2245 03/03/15 0405  PHART 7.273* 7.316*  PCO2ART 48.9* 43.6   PO2ART 68.8* 94.6   Liver Enzymes No results for input(s): AST, ALT, ALKPHOS, BILITOT, ALBUMIN in the last 168 hours. Cardiac Enzymes No results for input(s): TROPONINI, PROBNP in the last 168 hours. Glucose No results for input(s): GLUCAP in the last 168 hours.  Imaging No results found.  I reviewed CXR myself, no signs of acute disease.  Discussed with speech pathology and bedside RN.  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 Conchita Paris). Acute encephalopathy due to above + sedation. P:   Post op care per neurosurgery. Decadron, Keppra, Nimodipine, will defer to neuro. Percocet for pain.  PULMONARY OETT 6/23 >>>6/25 A: VDRF following SAH with clipping of ruptured right MCA aneurysm. Tobacco use disorder. DIFFICULT AIRWAY PER ANESTHESIA P:   Titrate O2 to off. D/C further CXR and ABG Tobacco cessation counseling.  CARDIOVASCULAR A:  Hypertensive emergency - with subsequent SAH. Hx HTN, HLD. P:  Goal SBP < 160. Cardene gtt off Labetalol PRN. Add PO norvasc today.  RENAL A:   Hypokalemia P:   KVO IVF. BMP in AM. Replace K.  GASTROINTESTINAL A:   GI prophylaxis. Nutrition. P:   SUP: Pantoprazole. Diet as ordered.  HEMATOLOGIC A:   VTE Prophylaxis. P:  SCD's. CBC in AM.  INFECTIOUS A:   No indication of infection. P:   Monitor clinically.  ENDOCRINE A:   No acute issues. P:   Monitor glucose on BMP.  Family updated: Wife and patient updated bedside.  Drain in place.  Likely to remove drain on Tuesday but will defer to neuro.  Hold in the ICU for now.  Alyson Reedy, M.D. [facility] Pulmonary/Critical Care Medicine. Pager: [phone]. After hours pager: [phone].  03/05/2015 10:47 AM

## 2015-03-05 NOTE — Progress Notes (Signed)
Subjective: Patient reports persistent moderate headache improved with hydrocodone  Objective: Vital signs in last 24 hours: Temp:  [97.5 F (36.4 C)-98.5 F (36.9 C)] 97.9 F (36.6 C) (06/26 0357) Pulse Rate:  [58-94] 63 (06/26 0700) Resp:  [13-27] 16 (06/26 0700) BP: (142-180)/(64-121) 180/79 mmHg (06/26 0830) SpO2:  [92 %-100 %] 100 % (06/26 0700)  Intake/Output from previous day: 06/25 0701 - 06/26 0700 In: 2950 [P.O.:840; I.V.:1800; IV Piggyback:310] Out: 3325 [Urine:3150; Drains:175] Intake/Output this shift:    Physical Exam: No drift.  Awake, alert, conversant.  MAEW  Lab Results:  Recent Labs  03/04/15 0317 03/05/15 0210  WBC 12.1* 12.2*  HGB 11.1* 11.3*  HCT 33.0* 34.2*  PLT 233 252   BMET  Recent Labs  03/04/15 0317 03/05/15 0210  NA 142 140  K 3.6 4.4  CL 109 109  CO2 23 22  GLUCOSE 153* 135*  BUN 11 15  CREATININE 0.78 0.91  CALCIUM 8.4* 8.8*    Studies/Results: No results found.  Assessment/Plan: Doing well.  I will let SBP increase to 180.  Continue IVC.  OK to mobilize.    LOS: 3 days    Dorian Heckle, MD 03/05/2015, 9:09 AM

## 2015-03-05 NOTE — Progress Notes (Signed)
MD notified regarding new onset confusion. Orders received.

## 2015-03-06 DIAGNOSIS — E871 Hypo-osmolality and hyponatremia: Secondary | ICD-10-CM

## 2015-03-06 LAB — CBC WITH DIFFERENTIAL/PLATELET
Basophils Absolute: 0 10*3/uL (ref 0.0–0.1)
Basophils Relative: 0 % (ref 0–1)
Eosinophils Absolute: 0 10*3/uL (ref 0.0–0.7)
Eosinophils Relative: 0 % (ref 0–5)
HCT: 37.1 % — ABNORMAL LOW (ref 39.0–52.0)
Hemoglobin: 12.7 g/dL — ABNORMAL LOW (ref 13.0–17.0)
Lymphocytes Relative: 16 % (ref 12–46)
Lymphs Abs: 1.8 10*3/uL (ref 0.7–4.0)
MCH: 32.3 pg (ref 26.0–34.0)
MCHC: 34.2 g/dL (ref 30.0–36.0)
MCV: 94.4 fL (ref 78.0–100.0)
MONOS PCT: 8 % (ref 3–12)
Monocytes Absolute: 0.9 10*3/uL (ref 0.1–1.0)
NEUTROS PCT: 76 % (ref 43–77)
Neutro Abs: 8.7 10*3/uL — ABNORMAL HIGH (ref 1.7–7.7)
PLATELETS: 306 10*3/uL (ref 150–400)
RBC: 3.93 MIL/uL — ABNORMAL LOW (ref 4.22–5.81)
RDW: 13.4 % (ref 11.5–15.5)
WBC: 11.5 10*3/uL — ABNORMAL HIGH (ref 4.0–10.5)

## 2015-03-06 LAB — BASIC METABOLIC PANEL
Anion gap: 10 (ref 5–15)
BUN: 22 mg/dL — AB (ref 6–20)
CALCIUM: 8.9 mg/dL (ref 8.9–10.3)
CO2: 20 mmol/L — ABNORMAL LOW (ref 22–32)
CREATININE: 0.74 mg/dL (ref 0.61–1.24)
Chloride: 105 mmol/L (ref 101–111)
GFR calc Af Amer: >60 mL/min (ref >60)
Glucose, Bld: 117 mg/dL — ABNORMAL HIGH (ref 65–99)
Potassium: 4.1 mmol/L (ref 3.5–5.1)
Sodium: 135 mmol/L (ref 135–145)

## 2015-03-06 LAB — TYPE AND SCREEN
ABO/RH(D): A NEG
ANTIBODY SCREEN: NEGATIVE
Unit division: 0
Unit division: 0

## 2015-03-06 LAB — SODIUM, URINE, RANDOM: SODIUM UR: 137 mmol/L

## 2015-03-06 MED ORDER — LEVETIRACETAM 500 MG PO TABS
500.0000 mg | ORAL_TABLET | Freq: Two times a day (BID) | ORAL | Status: DC
  Administered 2015-03-06 – 2015-03-12 (×13): 500 mg via ORAL
  Filled 2015-03-06 (×14): qty 1

## 2015-03-06 MED ORDER — AMLODIPINE BESYLATE 10 MG PO TABS
10.0000 mg | ORAL_TABLET | Freq: Every day | ORAL | Status: DC
  Administered 2015-03-07 – 2015-03-12 (×6): 10 mg via ORAL
  Filled 2015-03-06 (×6): qty 1

## 2015-03-06 NOTE — Anesthesia Postprocedure Evaluation (Signed)
  Anesthesia Post-op Note  Patient: Daniel Butler  Procedure(s) Performed: Procedure(s): RADIOLOGY WITH ANESTHESIA (N/A)  Patient Location: PACU  Anesthesia Type:General  Level of Consciousness: awake and alert   Airway and Oxygen Therapy: Patient Spontanous Breathing  Post-op Pain: none  Post-op Assessment: Post-op Vital signs reviewed LLE Motor Response: Purposeful movement, Responds to commands   RLE Motor Response: Purposeful movement, Responds to commands        Post-op Vital Signs: stable  Last Vitals:  Filed Vitals:   03/06/15 0700  BP: 163/91  Pulse: 56  Temp:   Resp: 12    Complications: No apparent anesthesia complications

## 2015-03-06 NOTE — Progress Notes (Signed)
Physical Therapy Treatment Patient Details Name: DRAYKE FINAMORE MRN: [id number] DOB: 09-08-1966 Today's Date: 03/06/2015    History of Present Illness Patient is a 49 yo male admitted 03/02/15 with severe headache.  Patient with SAH, s/p clipping of Rt MCA aneurysm, with post-op VDRF.  Patient extubated 03/03/15.  PMH:  HTN, Gout    PT Comments    Pt continues to have cognitive and mobility deficits.  Feel pt would benefit from CIR at D/C to maximize independence and decrease burden of care prior to returning to home with family.  Will continue to follow.    Follow Up Recommendations  CIR     Equipment Recommendations  None recommended by PT    Recommendations for Other Services Rehab consult     Precautions / Restrictions Precautions Precautions: Fall Precaution Comments: ICP/Ventricular drainage catheter. Restrictions Weight Bearing Restrictions: No    Mobility  Bed Mobility Overal bed mobility: Needs Assistance;+2 for physical assistance Bed Mobility: Supine to Sit     Supine to sit: Min assist;+2 for safety/equipment     General bed mobility comments: pt impulsive and needs 2nd person to help A with tending to lines and pt safety.    Transfers Overall transfer level: Needs assistance Equipment used: None Transfers: Sit to/from Stand Sit to Stand: Min assist;+2 safety/equipment         General transfer comment: cues for safety and attending to task.  pt unsteady and needs MinA for balance.    Ambulation/Gait Ambulation/Gait assistance: Min assist;+2 safety/equipment Ambulation Distance (Feet): 80 Feet Assistive device: 1 person hand held assist Gait Pattern/deviations: Step-through pattern;Decreased stride length;Shuffle;Staggering right     General Gait Details: cues for attending to task and safety during ambulation.  pt shuffles feet and with multiple small LOB.  pt needs 2 person A for safety with mobility as one person needs to A pt and a 2nd to A  with lines/equip.     Stairs            Wheelchair Mobility    Modified Rankin (Stroke Patients Only) Modified Rankin (Stroke Patients Only) Pre-Morbid Rankin Score: No symptoms Modified Rankin: Moderately severe disability     Balance Overall balance assessment: Needs assistance Sitting-balance support: No upper extremity supported;Feet supported Sitting balance-Leahy Scale: Fair     Standing balance support: During functional activity Standing balance-Leahy Scale: Poor                      Cognition Arousal/Alertness: Awake/alert Behavior During Therapy: Impulsive Overall Cognitive Status: Impaired/Different from baseline Area of Impairment: Orientation;Attention;Memory;Following commands;Safety/judgement;Awareness;Problem solving Orientation Level: Disoriented to;Time;Situation Current Attention Level: Selective Memory: Decreased recall of precautions;Decreased short-term memory Following Commands: Follows one step commands inconsistently;Follows one step commands with increased time Safety/Judgement: Decreased awareness of safety;Decreased awareness of deficits Awareness: Intellectual Problem Solving: Slow processing;Difficulty sequencing;Requires verbal cues;Requires tactile cues General Comments: pt impulsive and needs frequent cues for safety and slowing down.  pt easily distracted and needs cues to stay on task.  pt makes random off topic comments like do you think it would be good if I stood in the hallway and yelled "Fish, Fish, Fish"?      Exercises      General Comments        Pertinent Vitals/Pain Pain Assessment: Faces Faces Pain Scale: Hurts little more Pain Location: pt indicates his head is sore. Pain Descriptors / Indicators: Headache Pain Intervention(s): Monitored during session;Premedicated before session;Repositioned    Home Living  Prior Function            PT Goals (current goals can now be  found in the care plan section) Acute Rehab PT Goals Patient Stated Goal: To go home soon PT Goal Formulation: With patient Time For Goal Achievement: 03/11/15 Potential to Achieve Goals: Good Progress towards PT goals: Progressing toward goals    Frequency  Min 3X/week    PT Plan Discharge plan needs to be updated;Frequency needs to be updated    Co-evaluation             End of Session Equipment Utilized During Treatment: Gait belt Activity Tolerance: Patient tolerated treatment well Patient left: in chair;with call bell/phone within reach;with nursing/sitter in room;with chair alarm set     Time: 4098-1191 PT Time Calculation (min) (ACUTE ONLY): 24 min  Charges:  $Gait Training: 23-37 mins                    G CodesSunny Schlein, [place] [phone] 03/06/2015, 4:01 PM

## 2015-03-06 NOTE — Care Management Note (Signed)
Case Management Note  Patient Details  Name: Daniel Butler MRN: [id number] Date of Birth: 05/03/1966  Subjective/Objective:    Pt with SAH due to MCA aneurysm.  Pt now with increased confusion, requiring sitter.               Action/Plan: PT recommending HH follow up at dc; will follow progress.   Expected Discharge Date:                  Expected Discharge Plan:  Home w Home Health Services  In-House Referral:     Discharge planning Services  CM Consult  Post Acute Care Choice:    Choice offered to:     DME Arranged:    DME Agency:     HH Arranged:    HH Agency:     Status of Service:  In process, will continue to follow  Medicare Important Message Given:    Date Medicare IM Given:    Medicare IM give by:    Date Additional Medicare IM Given:    Additional Medicare Important Message give by:     If discussed at Long Length of Stay Meetings, dates discussed:    Additional Comments:  Quintella Baton, RN, BSN  Trauma/Neuro ICU Case Manager [phone]

## 2015-03-06 NOTE — Progress Notes (Signed)
Rehab Admissions Coordinator Note:  Patient was screened by Burlon Centrella L for appropriateness for an Inpatient Acute Rehab Consult.  At this time, we are recommending Inpatient Rehab consult.  Iva Posten L 03/06/2015, 4:22 PM  I can be reached at [phone].

## 2015-03-06 NOTE — Progress Notes (Signed)
PULMONARY / CRITICAL CARE MEDICINE   Name: Daniel Butler MRN: [id number] DOB: 1965-10-04    ADMISSION DATE:  03/02/2015 CONSULTATION DATE:  03/06/2015  REFERRING MD :  Conchita Paris  CHIEF COMPLAINT:  VDRF following clipping of R MCA aneurysm.  INITIAL PRESENTATION:  49 y.o. 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  Underwent clipping and placement of ventriculostomy, then returned to ICU on the vent.  PCCM was called for vent management.   STUDIES:  CT head 6/23 >>> diffuse SAH with intraventricular hemorrhage and ventricular dilatation. 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  Aneurysm measures 7.4 x 4.65mm.  No significant vasospasm.  SIGNIFICANT EVENTS: 6/23 - admitted with [facility] due to rupture of R MCA aneurysm.  Taken to OR for clipping and ventriculostomy.  HISTORY OF PRESENT ILLNESS:  Pt is encephalopathic; therefore, this HPI is obtained from chart review. Daniel Butler is a 49 y.o. M with PMH of HTN, HLD, gout, who presented to [facility] ED 6/23 with sudden onset of severe HA 2 days prior while bending over to pick something up at his home.  He tried to place an icepack on his head which improved symptoms somewhat but not fully.  Symptoms persisted for the past 2 days therefore he decided to come to ED for further evaluation.  Pain has been severe enough at times to prevent him from sleeping or eating.  He had not experienced any vision changes, chest pain, SOB, N/V, weakness.  He does smoke with ~ 45 pack year hx.  He apparently stopped his antihypertensives 8 days ago (unclear reasons) but informed admitting MD that he was never told that needed to continue with them.  CT of the brain revealed a small SAH with minimal 3rd ventricle blood.  There was no hydrocephalus.  He was evaluated by Dr. Conchita Paris of neurosurgery.  He was taken to the OR and had a right frontotemporal craniotomy for clipping of R MCA aneurysm and placement of a ventriculostomy through  a bur hole.  He returned to the ICU on the ventilator and PCCM was consulted for vent management.  SUBJECTIVE: Extubated and doing well.  VITAL SIGNS: Temp:  [97.7 F (36.5 C)-98.3 F (36.8 C)] 98.3 F (36.8 C) (06/27 0803) Pulse Rate:  [51-68] 59 (06/27 0900) Resp:  [12-23] 16 (06/27 0900) BP: (137-172)/(64-91) 163/79 mmHg (06/27 0900) SpO2:  [97 %-99 %] 99 % (06/27 0900)   HEMODYNAMICS:   VENTILATOR SETTINGS:     INTAKE / OUTPUT: Intake/Output      06/26 0701 - 06/27 0700 06/27 0701 - 06/28 0700   P.O. 1200    I.V. (mL/kg) 1800 (16.9) 150 (1.4)   IV Piggyback 210    Total Intake(mL/kg) 3210 (30.1) 150 (1.4)   Urine (mL/kg/hr) 4600 (1.8)    Drains 80 (0) 11 (0)   Total Output 4680 11   Net -1470 +139         PHYSICAL EXAMINATION: General: Young male, in NAD. Neuro: Awake and following commands. Head: Skull with dressing with a drain in. EENT: PERRL, EOM-I and MMM Cardiovascular: RRR, no M/R/G.  Lungs: Respirations even and unlabored.  CTA bilaterally, No W/R/R. Abdomen: BS x 4, soft, NT/ND.  Musculoskeletal: No gross deformities, no edema.  Skin: Intact, warm, no rashes.  LABS:  CBC  Recent Labs Lab 03/04/15 0317 03/05/15 0210 03/05/15 2350  WBC 12.1* 12.2* 11.5*  HGB 11.1* 11.3* 12.7*  HCT 33.0* 34.2* 37.1*  PLT 233 252 306   Coag's No results for input(s): APTT, INR in the last 168 hours. BMET  Recent Labs Lab 03/04/15 0317 03/05/15 0210 03/05/15 2350  NA 142 140 135  K 3.6 4.4 4.1  CL 109 109 105  CO2 23 22 20*  BUN 11 15 22*  CREATININE 0.78 0.91 0.74  GLUCOSE 153* 135* 117*   Electrolytes  Recent Labs Lab 03/03/15 0540 03/04/15 0317 03/05/15 0210 03/05/15 2350  CALCIUM 7.8* 8.4* 8.8* 8.9  MG 1.9 2.2 2.6*  --   PHOS 4.3 1.8* 2.1*  --    Sepsis Markers No results for input(s): LATICACIDVEN, PROCALCITON, O2SATVEN in the last 168 hours. ABG  Recent Labs Lab 03/02/15 2245 03/03/15 0405  PHART 7.273* 7.316*  PCO2ART  48.9* 43.6  PO2ART 68.8* 94.6   Liver Enzymes No results for input(s): AST, ALT, ALKPHOS, BILITOT, ALBUMIN in the last 168 hours. Cardiac Enzymes No results for input(s): TROPONINI, PROBNP in the last 168 hours. Glucose No results for input(s): GLUCAP in the last 168 hours.  Imaging No results found.  I reviewed CXR myself, no signs of acute disease.  Discussed with bedside RN.  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 Conchita Paris). Acute encephalopathy due to above + sedation. P:   Post op care per neurosurgery. Two more days with drain in place, will need ICU monitoring. Decadron, Keppra, Nimodipine, will defer to neuro. Percocet for pain. D/C IV narcs.  PULMONARY OETT 6/23 >>>6/25 A: VDRF following SAH with clipping of ruptured right MCA aneurysm. Tobacco use disorder. DIFFICULT AIRWAY PER ANESTHESIA P:   Titrate O2 to off. D/C further CXR and ABG Tobacco cessation counseling.  CARDIOVASCULAR A:  Hypertensive emergency - with subsequent SAH. Hx HTN, HLD. P:  Goal SBP < 160. Cardene gtt off Labetalol PRN. Increase PO norvasc today to 10 mg daily.  RENAL A:   Hypokalemia Borderline hyponatremia P:   KVO IVF. Check urine specific gravity and sodium level. BMP in AM. Replace K.  GASTROINTESTINAL A:   GI prophylaxis. Nutrition. P:   SUP: Pantoprazole. Diet as ordered. D/C IVF since eating full diet.  HEMATOLOGIC A:   VTE Prophylaxis. P:  SCD's. CBC in AM.  INFECTIOUS A:   No indication of infection. P:   Monitor clinically.  ENDOCRINE A:   No acute issues. P:   Monitor glucose on BMP.  Family updated: Patient updated bedside.  Drain in place.  Likely to remove drain on Tuesday or Wednesday but will defer to neuro.  Hold in the ICU for now.  Alyson Reedy, M.D. [facility] Pulmonary/Critical Care Medicine. Pager: [phone]. After hours pager:  [phone].  03/06/2015 10:25 AM

## 2015-03-07 DIAGNOSIS — I609 Nontraumatic subarachnoid hemorrhage, unspecified: Secondary | ICD-10-CM

## 2015-03-07 DIAGNOSIS — I10 Essential (primary) hypertension: Secondary | ICD-10-CM

## 2015-03-07 DIAGNOSIS — E872 Acidosis: Secondary | ICD-10-CM

## 2015-03-07 LAB — CBC
HCT: 41.7 % (ref 39.0–52.0)
Hemoglobin: 14.4 g/dL (ref 13.0–17.0)
MCH: 32.2 pg (ref 26.0–34.0)
MCHC: 34.5 g/dL (ref 30.0–36.0)
MCV: 93.3 fL (ref 78.0–100.0)
PLATELETS: 335 10*3/uL (ref 150–400)
RBC: 4.47 MIL/uL (ref 4.22–5.81)
RDW: 13.2 % (ref 11.5–15.5)
WBC: 11.1 10*3/uL — AB (ref 4.0–10.5)

## 2015-03-07 LAB — BASIC METABOLIC PANEL
ANION GAP: 12 (ref 5–15)
BUN: 28 mg/dL — ABNORMAL HIGH (ref 6–20)
CALCIUM: 9.2 mg/dL (ref 8.9–10.3)
CO2: 18 mmol/L — ABNORMAL LOW (ref 22–32)
Chloride: 104 mmol/L (ref 101–111)
Creatinine, Ser: 0.71 mg/dL (ref 0.61–1.24)
GFR calc Af Amer: >60 mL/min (ref >60)
Glucose, Bld: 136 mg/dL — ABNORMAL HIGH (ref 65–99)
Potassium: 4.2 mmol/L (ref 3.5–5.1)
SODIUM: 134 mmol/L — AB (ref 135–145)

## 2015-03-07 LAB — MAGNESIUM: MAGNESIUM: 2.4 mg/dL (ref 1.7–2.4)

## 2015-03-07 LAB — PHOSPHORUS: PHOSPHORUS: 3.5 mg/dL (ref 2.5–4.6)

## 2015-03-07 LAB — LACTIC ACID, PLASMA: Lactic Acid, Venous: 1.2 mmol/L (ref 0.5–2.0)

## 2015-03-07 MED ORDER — ACETONE (URINE) TEST VI STRP
1.0000 | ORAL_STRIP | Status: DC | PRN
  Filled 2015-03-07: qty 1

## 2015-03-07 NOTE — Progress Notes (Signed)
Pt seen and examined. No issues overnight. Has episodes of confusion, but he has no complaints.  EXAM: Temp:  [97.5 F (36.4 C)-99.3 F (37.4 C)] 98.3 F (36.8 C) (06/28 1138) Pulse Rate:  [51-63] 56 (06/28 1000) Resp:  [14-22] 16 (06/28 1000) BP: (134-187)/(47-93) 160/87 mmHg (06/28 1000) SpO2:  [96 %-99 %] 99 % (06/28 1000) Intake/Output      06/27 0701 - 06/28 0700 06/28 0701 - 06/29 0700   P.O.     I.V. (mL/kg) 300 (2.8)    IV Piggyback     Total Intake(mL/kg) 300 (2.8)    Urine (mL/kg/hr) 2850 (1.1) 400 (0.8)   Drains 97 (0) 16 (0)   Total Output 2947 416   Net -2647 -416        Urine Occurrence 2 x     Awake, alert Speech fluent CN intact Moving all extremities well EVD in place, ~100cc output  LABS: Lab Results  Component Value Date   CREATININE 0.71 03/07/2015   BUN 28* 03/07/2015   NA 134* 03/07/2015   K 4.2 03/07/2015   CL 104 03/07/2015   CO2 18* 03/07/2015   Lab Results  Component Value Date   WBC 11.1* 03/07/2015   HGB 14.4 03/07/2015   HCT 41.7 03/07/2015   MCV 93.3 03/07/2015   PLT 335 03/07/2015    IMPRESSION: - 49 y.o. male SAH d# 8 s/p RMCA aneurysm clipping, remains neurologically intact  PLAN: - Cont current mgmt, observation for spasm

## 2015-03-07 NOTE — Evaluation (Signed)
Speech Language Pathology Evaluation Patient Details Name: Daniel Butler MRN: [id number] DOB: 1966/08/10 Today's Date: 03/07/2015 Time: 0258-5277 SLP Time Calculation (min) (ACUTE ONLY): 18 min  Problem List:  Patient Active Problem List   Diagnosis Date Noted  . Encounter for orogastric (OG) tube placement   . Subarachnoid hemorrhage 03/02/2015  . Acute respiratory failure with hypoxia   . SAH (subarachnoid hemorrhage)   . Subarachnoid bleed   . Malignant hypertension    Past Medical History:  Past Medical History  Diagnosis Date  . Hypertension   . High cholesterol   . Gout    Past Surgical History:  Past Surgical History  Procedure Laterality Date  . Wisdom tooth extraction    . Radiology with anesthesia N/A 03/02/2015    Procedure: RADIOLOGY WITH ANESTHESIA;  Surgeon: Lisbeth Renshaw, MD;  Location: [facility] OR;  Service: Radiology;  Laterality: N/A;  . Craniotomy Right 03/02/2015    Procedure: CRANIOTOMY INTRACRANIAL  ANEURYSM FOR CLIPPING;  Surgeon: Lisbeth Renshaw, MD;  Location: [facility]UR[facility] ORS;  Service: Neurosurgery;  Laterality: Right;   HPI:  Patient is a 49 yo male admitted 03/02/15 with severe headache. Patient with SAH, s/p clipping of Rt MCA aneurysm, with post-op VDRF. Patient extubated 03/03/15. PMH: HTN, Gout   Assessment / Plan / Recommendation Clinical Impression  Pt has very limited awareness of current cognitive and physical limitations, making safety awareness impaired as well. He has good recall of daily events and education provided by other providers, however he does not acknowledge the assistance he is currently needing and insists that he is currently independent. Selective attention and basic verbal problem solving are also impaired. Pt requires SLP services to maximize cognitive recovery and increase pt safety.    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)   Probation officer Status: Impaired/Different from baseline Arousal/Alertness: Awake/alert Orientation Level: Oriented X4 Attention: Selective Selective Attention: Impaired Selective Attention Impairment: Verbal basic Memory: Appears intact Awareness: Impaired Awareness Impairment: Intellectual impairment;Emergent impairment;Anticipatory impairment Problem Solving: Impaired Problem Solving Impairment: Verbal basic Safety/Judgment: Impaired    Comprehension  Auditory Comprehension Overall Auditory Comprehension: Appears within functional limits for tasks assessed (with basic tasks)    Expression Expression Primary Mode of Expression: Verbal Verbal Expression Overall Verbal Expression: Appears within functional limits for tasks assessed   Oral / Motor Motor Speech Overall Motor Speech: Appears within functional limits for tasks assessed       Maxcine Ham, M.A. CCC-SLP [phone]  Maxcine Ham 03/07/2015, 4:09 PM

## 2015-03-07 NOTE — Care Management Note (Signed)
Case Management Note  Patient Details  Name: TAESHAWN BRANDENBERGER MRN: [id number] Date of Birth: 12-13-1965  Subjective/Objective:      Pt adm with VDRF following RT MCA aneurysm.                Action/Plan: PT/OT recommending IP Rehab; consult in progress.  Will follow progress.    Expected Discharge Date:                  Expected Discharge Plan:  IP Rehab Facility  In-House Referral:     Discharge planning Services  CM Consult  Post Acute Care Choice:    Choice offered to:     DME Arranged:    DME Agency:     HH Arranged:    HH Agency:     Status of Service:  In process, will continue to follow  Medicare Important Message Given:    Date Medicare IM Given:    Medicare IM give by:    Date Additional Medicare IM Given:    Additional Medicare Important Message give by:     If discussed at Long Length of Stay Meetings, dates discussed:    Additional Comments:  Quintella Baton, RN, BSN  Trauma/Neuro ICU Case Manager [phone]

## 2015-03-07 NOTE — Progress Notes (Signed)
PULMONARY / CRITICAL CARE MEDICINE   Name: Daniel Butler MRN: [id number] DOB: Jan 02, 1966    ADMISSION DATE:  03/02/2015 CONSULTATION DATE:  03/07/2015  REFERRING MD :  Conchita Paris  CHIEF COMPLAINT:  VDRF following clipping of R MCA aneurysm.  INITIAL PRESENTATION:  49 y.o. 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  Underwent clipping and placement of ventriculostomy, then returned to ICU on the vent.  PCCM was called for vent management.   STUDIES:  CT head 6/23 >>> diffuse SAH with intraventricular hemorrhage and ventricular dilatation. 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  Aneurysm measures 7.4 x 4.8mm.  No significant vasospasm.  SIGNIFICANT EVENTS: 6/23 - admitted with [facility] due to rupture of R MCA aneurysm.  Taken to OR for clipping and ventriculostomy.  HISTORY OF PRESENT ILLNESS:  Pt is encephalopathic; therefore, this HPI is obtained from chart review. Daniel Butler is a 48 y.o. M with PMH of HTN, HLD, gout, who presented to [facility] ED 6/23 with sudden onset of severe HA 2 days prior while bending over to pick something up at his home.  He tried to place an icepack on his head which improved symptoms somewhat but not fully.  Symptoms persisted for the past 2 days therefore he decided to come to ED for further evaluation.  Pain has been severe enough at times to prevent him from sleeping or eating.  He had not experienced any vision changes, chest pain, SOB, N/V, weakness.  He does smoke with ~ 45 pack year hx.  He apparently stopped his antihypertensives 8 days ago (unclear reasons) but informed admitting MD that he was never told that needed to continue with them.  CT of the brain revealed a small SAH with minimal 3rd ventricle blood.  There was no hydrocephalus.  He was evaluated by Dr. Conchita Paris of neurosurgery.  He was taken to the OR and had a right frontotemporal craniotomy for clipping of R MCA aneurysm and placement of a ventriculostomy through  a bur hole.  He returned to the ICU on the ventilator and PCCM was consulted for vent management.  SUBJECTIVE: Extubated and doing well.  VITAL SIGNS: Temp:  [97.5 F (36.4 C)-99.3 F (37.4 C)] 97.5 F (36.4 C) (06/28 0719) Pulse Rate:  [51-63] 56 (06/28 1000) Resp:  [14-22] 16 (06/28 1000) BP: (134-187)/(47-93) 160/87 mmHg (06/28 1000) SpO2:  [96 %-99 %] 99 % (06/28 1000)   HEMODYNAMICS:   VENTILATOR SETTINGS:     INTAKE / OUTPUT: Intake/Output      06/27 0701 - 06/28 0700 06/28 0701 - 06/29 0700   P.O.     I.V. (mL/kg) 300 (2.8)    IV Piggyback     Total Intake(mL/kg) 300 (2.8)    Urine (mL/kg/hr) 2850 (1.1)    Drains 97 (0) 11 (0)   Total Output 2947 11   Net -2647 -11        Urine Occurrence 2 x     PHYSICAL EXAMINATION: General: Young male, in NAD. Neuro: Awake and following commands. Head: Skull with dressing with a drain in. EENT: PERRL, EOM-I and MMM Cardiovascular: RRR, no M/R/G.  Lungs: Respirations even and unlabored.  CTA bilaterally, No W/R/R. Abdomen: BS x 4, soft, NT/ND.  Musculoskeletal: No gross deformities, no edema.  Skin: Intact, warm, no rashes.  LABS:  CBC  Recent Labs Lab 03/05/15 0210 03/05/15 2350 03/07/15 0245  WBC 12.2* 11.5* 11.1*  HGB 11.3* 12.7* 14.4  HCT 34.2* 37.1* 41.7  PLT 252 306 335   Coag's No results for input(s): APTT, INR in the last 168 hours. BMET  Recent Labs Lab 03/05/15 0210 03/05/15 2350 03/07/15 0245  NA 140 135 134*  K 4.4 4.1 4.2  CL 109 105 104  CO2 22 20* 18*  BUN 15 22* 28*  CREATININE 0.91 0.74 0.71  GLUCOSE 135* 117* 136*   Electrolytes  Recent Labs Lab 03/04/15 0317 03/05/15 0210 03/05/15 2350 03/07/15 0245  CALCIUM 8.4* 8.8* 8.9 9.2  MG 2.2 2.6*  --  2.4  PHOS 1.8* 2.1*  --  3.5   Sepsis Markers No results for input(s): LATICACIDVEN, PROCALCITON, O2SATVEN in the last 168 hours. ABG  Recent Labs Lab 03/02/15 2245 03/03/15 0405  PHART 7.273* 7.316*  PCO2ART 48.9*  43.6  PO2ART 68.8* 94.6   Liver Enzymes No results for input(s): AST, ALT, ALKPHOS, BILITOT, ALBUMIN in the last 168 hours. Cardiac Enzymes No results for input(s): TROPONINI, PROBNP in the last 168 hours. Glucose No results for input(s): GLUCAP in the last 168 hours.  Imaging No results found.  I reviewed CXR myself, no signs of acute disease.  Discussed with bedside RN.  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 Conchita Paris). Acute encephalopathy due to above + sedation. P:   Post op care per neurosurgery. Two more days with drain in place, will need ICU monitoring. Decadron, Keppra, Nimodipine, will defer to neuro. Percocet for pain. D/C IV narcs.  PULMONARY OETT 6/23 >>>6/25 A: VDRF following SAH with clipping of ruptured right MCA aneurysm. Tobacco use disorder. DIFFICULT AIRWAY PER ANESTHESIA P:   Titrate O2 to off. D/C further CXR and ABG Tobacco cessation counseling.  CARDIOVASCULAR A:  Hypertensive emergency - with subsequent SAH. Hx HTN, HLD. P:  Goal SBP < 160. Cardene gtt off Labetalol PRN. Continue PO norvasc today to 10 mg daily. If increases will consider hydralazine or clonidine, no beta blockers given low HR.  RENAL A:   Hypokalemia Borderline hyponatremia Worsening bicarb, none gap unlikely hyperchloremia. P:   KVO IVF. Urine specific gravity 1.017 Sodium level 137. BMP in AM. Replace K. Will check BMET again in AM. Check lactic acid and ketone level.  GASTROINTESTINAL A:   GI prophylaxis. Nutrition. P:   SUP: Pantoprazole. Diet as ordered.  HEMATOLOGIC A:   VTE Prophylaxis. P:  SCD's. CBC in AM.  INFECTIOUS A:   No indication of infection. P:   Monitor clinically.  ENDOCRINE A:   No acute issues. P:   Monitor glucose on BMP.  Family updated: Patient updated bedside.  Drain in place.  Likely to remove drain on Tuesday or  Wednesday but will defer to neuro.  Hold in the ICU for now.  Awaiting rehab.  Alyson Reedy, M.D. [facility] Pulmonary/Critical Care Medicine. Pager: [phone]. After hours pager: [phone].  03/07/2015 10:21 AM

## 2015-03-07 NOTE — Evaluation (Signed)
Occupational Therapy Evaluation Patient Details Name: Daniel Butler MRN: [id number] DOB: 08/19/1966 Today's Date: 03/07/2015    History of Present Illness Patient is a 49 yo male admitted 03/02/15 with severe headache.  Patient with SAH, s/p clipping of Rt MCA aneurysm, with post-op VDRF.  Patient extubated 03/03/15.  PMH:  HTN, Gout   Clinical Impression   Pt admitted with above. He demonstrates the below listed deficits and will benefit from continued OT to maximize safety and independence with BADLs.  Pt presents to OT with generalized weakness, impaired balance, and cognitive deficits including impaired sequencing, problem solving, safety and judgement.   He currently requires min A for ADLs.  Recommend CIR.       Follow Up Recommendations  CIR;Supervision/Assistance - 24 hour    Equipment Recommendations  Tub/shower bench    Recommendations for Other Services       Precautions / Restrictions Precautions Precautions: Fall Precaution Comments: ICP/Ventricular drainage catheter.      Mobility Bed Mobility Overal bed mobility: Needs Assistance;+2 for physical assistance Bed Mobility: Supine to Sit     Supine to sit: Min assist     General bed mobility comments: Pt impulsive.  Requires verbal cues for sequencing and problem solving as well as assist to lift trunk from bed   Transfers Overall transfer level: Needs assistance Equipment used: None Transfers: Sit to/from Stand;Stand Pivot Transfers Sit to Stand: Min assist Stand pivot transfers: Min assist       General transfer comment: cues for safety and assist for balance     Balance Overall balance assessment: Needs assistance Sitting-balance support: Feet supported Sitting balance-Leahy Scale: Fair     Standing balance support: During functional activity Standing balance-Leahy Scale: Poor Standing balance comment: requires min A                             ADL Overall ADL's : Needs  assistance/impaired Eating/Feeding: Independent   Grooming: Wash/dry hands;Wash/dry face;Oral care;Min guard;Standing Grooming Details (indicate cue type and reason): min A for balance.  Upper Body Bathing: Supervision/ safety Upper Body Bathing Details (indicate cue type and reason): required cues for thoroughness  Lower Body Bathing: Minimal assistance;Sit to/from stand Lower Body Bathing Details (indicate cue type and reason): min A for balance and min verbal cues for sequencing  Upper Body Dressing : Minimal assistance;Sitting   Lower Body Dressing: Minimal assistance;Sit to/from stand Lower Body Dressing Details (indicate cue type and reason): min A for balance  Toilet Transfer: Minimal assistance;Ambulation;Comfort height toilet;BSC;RW   Toileting- Clothing Manipulation and Hygiene: Minimal assistance;Sit to/from stand       Functional mobility during ADLs: Minimal assistance General ADL Comments: Pt requires cues for problem solving and sequencing as well as for thoroughness.  Pt also not self initiating ADLs and refusing staff who offer to assist him      Vision Vision Assessment?: Yes Eye Alignment: Within Functional Limits Ocular Range of Motion: Within Functional Limits Alignment/Gaze Preference: Within Defined Limits Tracking/Visual Pursuits: Able to track stimulus in all quads without difficulty Saccades: Other (comment) Visual Fields: No apparent deficits Additional Comments: Pt impulsive with testing.  when attempting to assess saccades, pt moved eyes rapidly from left to right and did not attempt to hit the target provided resulting in under and overshooting    Perception Perception Perception Tested?: Yes   Praxis Praxis Praxis tested?: Within functional limits    Pertinent Vitals/Pain Pain Assessment: No/denies pain  Hand Dominance Right   Extremity/Trunk Assessment Upper Extremity Assessment Upper Extremity Assessment: Overall WFL for tasks  assessed   Lower Extremity Assessment Lower Extremity Assessment: Overall WFL for tasks assessed   Cervical / Trunk Assessment Cervical / Trunk Assessment: Normal   Communication Communication Communication: No difficulties   Cognition Arousal/Alertness: Awake/alert Behavior During Therapy: Impulsive Overall Cognitive Status: Impaired/Different from baseline Area of Impairment: Attention;Safety/judgement;Awareness;Problem solving Orientation Level: Time Current Attention Level: Selective (with cues ) Memory: Decreased recall of precautions Following Commands: Follows multi-step commands consistently Safety/Judgement: Decreased awareness of safety;Decreased awareness of deficits Awareness: Intellectual Problem Solving: Decreased initiation;Difficulty sequencing;Requires verbal cues;Requires tactile cues General Comments: Pt is impulsive.  Innapropriate use of humor (wife does confirm he has a strange sense of humor), but seems to try to use his humor to mask deficits.   Pt was initially refusing to bathe.  Wife reports he has refused a bath for the past 6 days.  When asked why, pt states "I'll do it when I get home".   Pt unable to deduce consequences of not bathing and need for doing so until time frame broken down for him.  Pt provided with basin of water and was unable to problem solve what to do with it.  Required cues to use it to sponge bathe (pt thought he was supposed to sit in the water).   Pt required mod cues to count number of days from [employer] (time he had not bathed).   Memory seems intact as he is able to recall conversation with MD and with nurse.     General Comments       Exercises       Shoulder Instructions      Home Living Family/patient expects to be discharged to:: Private residence Living Arrangements: Spouse/significant other (sister-in-law) Available Help at Discharge: Family;Available PRN/intermittently (Wife and sister-in-law work) Type of Home:  House Home Access: Stairs to enter Secretary/administrator of Steps: 2 Entrance Stairs-Rails: Right Home Layout: Two level;Able to live on main level with bedroom/bathroom (Patient reports his cats are in the basement) Alternate Level Stairs-Number of Steps: 16 Alternate Level Stairs-Rails: Right;Left Bathroom Shower/Tub: Tub/shower unit Shower/tub characteristics: Engineer, building services: Standard     Home Equipment: Information systems manager;Toilet riser;Cane - single point          Prior Functioning/Environment Level of Independence: Independent        Comments: manages a convenient store     OT Diagnosis: Generalized weakness;Cognitive deficits   OT Problem List: Decreased strength;Decreased activity tolerance;Impaired balance (sitting and/or standing);Decreased cognition;Decreased safety awareness;Decreased knowledge of use of DME or AE   OT Treatment/Interventions: Self-care/ADL training;DME and/or AE instruction;Therapeutic activities;Cognitive remediation/compensation;Patient/family education;Balance training    OT Goals(Current goals can be found in the care plan section) Acute Rehab OT Goals Patient Stated Goal: To go home soon OT Goal Formulation: With patient Time For Goal Achievement: 03/21/15 Potential to Achieve Goals: Good ADL Goals Pt Will Perform Grooming: with supervision;standing Pt Will Perform Upper Body Bathing: with set-up;sitting Pt Will Perform Lower Body Bathing: with supervision;sit to/from stand Pt Will Perform Upper Body Dressing: with set-up;sitting Pt Will Perform Lower Body Dressing: with supervision;sit to/from stand Pt Will Transfer to Toilet: with supervision;ambulating;regular height toilet;bedside commode;grab bars Pt Will Perform Toileting - Clothing Manipulation and hygiene: with supervision;sit to/from stand Pt Will Perform Tub/Shower Transfer: Tub transfer;Shower transfer;with min guard assist;ambulating;shower seat;grab bars Additional ADL Goal  #1: Pt will initiate am ADLs with no cues Additional ADL Goal #  2: Pt will demonstrate emergent awareness of deficits   OT Frequency: Min 3X/week   Barriers to D/C:            Co-evaluation              End of Session Nurse Communication: Mobility status  Activity Tolerance: Patient tolerated treatment well Patient left: in chair;with call bell/phone within reach;with family/visitor present;with chair alarm set   Time: 1610-9604 OT Time Calculation (min): 52 min Charges:  OT General Charges $OT Visit: 1 Procedure OT Evaluation $Initial OT Evaluation Tier I: 1 Procedure OT Treatments $Self Care/Home Management : 23-37 mins G-Codes:    Nashonda Limberg M 03-23-2015, 1:47 PM

## 2015-03-07 NOTE — Consult Note (Signed)
Physical Medicine and Rehabilitation Consult  Reason for Consult: Diffuse SAH due to  R-MCA aneurysm rupture.  Referring Physician: Dr. Conchita Paris.    HPI: Daniel Butler is a 49 y.o. male with history of untreated HTNwho was admitted on 03/02/15 with 2 day history of severe persistent headaches. CT head revealed diffuse SAH suggesting MCA aneurysm rupture. CTA head revealed Subarachnoid and intraventricular hemorrhage secondary to ruptured 7.4 X 4.8 mm R-MCA aneurysm. He was taken to OR that evening for right frontotemporal Crani for clipping of MCA aneurysm by Dr. Conchita Paris. Post op headaches improved but has had issues with insomnia with reports of confusion last night. EVD  remains in place and patient on Nimotop for prevention of vasospasm. Therapy ongoing and patient with  staggering gait, difficulty following one step commands as well as poor safety awareness. CIR recommended for follow up therapy.  Wife supportive and can provide supervision past discharge.    Review of Systems  HENT: Negative for hearing loss.   Eyes: Negative for blurred vision and double vision.  Respiratory: Negative for cough.   Cardiovascular: Negative for chest pain and palpitations.  Gastrointestinal: Negative for heartburn, nausea and constipation.  Genitourinary: Negative for dysuria.  Musculoskeletal: Negative for myalgias, back pain and neck pain.  Neurological: Negative for dizziness, tingling, sensory change, focal weakness and headaches.  Psychiatric/Behavioral: Negative for depression. The patient has insomnia. The patient is not nervous/anxious.       Past Medical History  Diagnosis Date  . Hypertension   . High cholesterol   . Gout     Past Surgical History  Procedure Laterality Date  . Wisdom tooth extraction    . Radiology with anesthesia N/A 03/02/2015    Procedure: RADIOLOGY WITH ANESTHESIA;  Surgeon: Lisbeth Renshaw, MD;  Location: [facility] OR;  Service: Radiology;  Laterality:  N/A;  . Craniotomy Right 03/02/2015    Procedure: CRANIOTOMY INTRACRANIAL  ANEURYSM FOR CLIPPING;  Surgeon: Lisbeth Renshaw, MD;  Location: [facility];  Service: Neurosurgery;  Laterality: Right;   Family History  Problem Relation Age of Onset  . Adopted: Yes    Social History:  Married. 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 Wife works but plans on being off for the next 1-2 weeks.  He  reports that he has been smoking Cigarettes.  He has been smoking about 1.50 packs per day. He does not have any smokeless tobacco history on file. He reports that he drinks about 21.0 oz of alcohol per week. He reports that he does not use illicit drugs.    Allergies: No Known Allergies    Medications Prior to Admission  Medication Sig Dispense Refill  . Aspirin-Acetaminophen-Caffeine (GOODY HEADACHE PO) Take by mouth.    Marland Kitchen ibuprofen (ADVIL,MOTRIN) 200 MG tablet Take 200 mg by mouth every 6 (six) hours as needed.      Home: Home Living Family/patient expects to be discharged to:: Private residence Living Arrangements: Spouse/significant other (sister-in-law) Available Help at Discharge: Family, Available PRN/intermittently (Wife and sister-in-law work) Type of Home: House Home Access: Stairs to enter Secretary/administrator of Steps: 2 Entrance Stairs-Rails: Right Home Layout: Two level, Able to live on main level with bedroom/bathroom (Patient reports his cats are in the basement) Alternate Level Stairs-Number of Steps: 16 Alternate Level Stairs-Rails: Right, Left Home Equipment: Shower seat, Toilet riser, Cane - single point  Functional History: Prior Function Level of Independence: Independent Comments: Works at Engineer, site Status:  Mobility: Bed  Mobility Overal bed mobility: Needs Assistance, +2 for physical assistance Bed Mobility: Supine to Sit Supine to sit: Min assist, +2 for safety/equipment Sit to supine: Min assist, +2 for  safety/equipment General bed mobility comments: pt impulsive and needs 2nd person to help A with tending to lines and pt safety.   Transfers Overall transfer level: Needs assistance Equipment used: None Transfers: Sit to/from Stand Sit to Stand: Min assist, +2 safety/equipment General transfer comment: cues for safety and attending to task.  pt unsteady and needs MinA for balance.   Ambulation/Gait Ambulation/Gait assistance: Min assist, +2 safety/equipment Ambulation Distance (Feet): 80 Feet Assistive device: 1 person hand held assist General Gait Details: cues for attending to task and safety during ambulation.  pt shuffles feet and with multiple small LOB.  pt needs 2 person A for safety with mobility as one person needs to A pt and a 2nd to A with lines/equip.   Gait Pattern/deviations: Step-through pattern, Decreased stride length, Shuffle, Staggering right Gait velocity: Decreased Gait velocity interpretation: Below normal speed for age/gender    ADL:    Cognition: Cognition Overall Cognitive Status: Impaired/Different from baseline Orientation Level: Oriented X4 Cognition Arousal/Alertness: Awake/alert Behavior During Therapy: Impulsive Overall Cognitive Status: Impaired/Different from baseline Area of Impairment: Orientation, Attention, Memory, Following commands, Safety/judgement, Awareness, Problem solving Orientation Level: Disoriented to, Time, Situation Current Attention Level: Selective Memory: Decreased recall of precautions, Decreased short-term memory Following Commands: Follows one step commands inconsistently, Follows one step commands with increased time Safety/Judgement: Decreased awareness of safety, Decreased awareness of deficits Awareness: Intellectual Problem Solving: Slow processing, Difficulty sequencing, Requires verbal cues, Requires tactile cues General Comments: pt impulsive and needs frequent cues for safety and slowing down.  pt easily distracted  and needs cues to stay on task.  pt makes random off topic comments like do you think it would be good if I stood in the hallway and yelled "Fish, Fish, Fish"?    Blood pressure 162/76, pulse 53, temperature 97.5 F (36.4 C), temperature source Oral, resp. rate 14, height 6\' 1"  (1.854 m), weight 106.595 kg (235 lb), SpO2 99 %. Physical Exam  Nursing note and vitals reviewed. Constitutional: He is oriented to person, place, and time. He appears well-developed and well-nourished.  HENT:  Head: Normocephalic and atraumatic.  EVD in place with serosanguinous drainage.   Eyes: Conjunctivae are normal. Pupils are equal, round, and reactive to light.  Neck: Normal range of motion. Neck supple.  Cardiovascular: Normal rate and regular rhythm.   Respiratory: Effort normal and breath sounds normal.  GI: Soft. Bowel sounds are normal. He exhibits no distension. There is no tenderness.  Neurological: He is alert and oriented to person, place, and time. No cranial nerve deficit.  Speech clear. Able to state date, age, details leading to hospitalization. Able to follow one and two step commands. Does have decreased insight into deficits. Moves all four equally. A little impulsive. Able to look up dates on his phone calendar and calculate estimated time from current date. No sensory deficits. Strength 5/5 in all 4's.   Skin: Skin is warm and dry.  Psychiatric: He has a normal mood and affect. His behavior is normal. Thought content normal.    Results for orders placed or performed during the hospital encounter of 03/02/15 (from the past 24 hour(s))  Sodium, urine, random     Status: None   Collection Time: 03/06/15  6:42 PM  Result Value Ref Range   Sodium, Ur 137 mmol/L  CBC  Status: Abnormal   Collection Time: 03/07/15  2:45 AM  Result Value Ref Range   WBC 11.1 (H) 4.0 - 10.5 K/uL   RBC 4.47 4.22 - 5.81 MIL/uL   Hemoglobin 14.4 13.0 - 17.0 g/dL   HCT 44.6 95.0 - 72.2 %   MCV 93.3 78.0 - 100.0  fL   MCH 32.2 26.0 - 34.0 pg   MCHC 34.5 30.0 - 36.0 g/dL   RDW 57.5 05.1 - 83.3 %   Platelets 335 150 - 400 K/uL  Basic metabolic panel     Status: Abnormal   Collection Time: 03/07/15  2:45 AM  Result Value Ref Range   Sodium 134 (L) 135 - 145 mmol/L   Potassium 4.2 3.5 - 5.1 mmol/L   Chloride 104 101 - 111 mmol/L   CO2 18 (L) 22 - 32 mmol/L   Glucose, Bld 136 (H) 65 - 99 mg/dL   BUN 28 (H) 6 - 20 mg/dL   Creatinine, Ser 5.82 0.61 - 1.24 mg/dL   Calcium 9.2 8.9 - 51.8 mg/dL   GFR calc non Af Amer >60 >60 mL/min   GFR calc Af Amer >60 >60 mL/min   Anion gap 12 5 - 15  Magnesium     Status: None   Collection Time: 03/07/15  2:45 AM  Result Value Ref Range   Magnesium 2.4 1.7 - 2.4 mg/dL  Phosphorus     Status: None   Collection Time: 03/07/15  2:45 AM  Result Value Ref Range   Phosphorus 3.5 2.5 - 4.6 mg/dL   No results found.  Assessment/Plan: Diagnosis: SAH due to right MCA aneurysm 1. Does the need for close, 24 hr/day medical supervision in concert with the patient's rehab needs make it unreasonable for this patient to be served in a less intensive setting? Potentially 2. Co-Morbidities requiring supervision/potential complications: htn 3. Due to bladder management, bowel management, safety, skin/wound care, disease management, medication administration, pain management and patient education, does the patient require 24 hr/day rehab nursing? Yes 4. Does the patient require coordinated care of a physician, rehab nurse, PT (1-2 hrs/day, 5 days/week), OT (1-2 hrs/day, 5 days/week) and SLP (1-2 hrs/day, 5 days/week) to address physical and functional deficits in the context of the above medical diagnosis(es)? Potentially Addressing deficits in the following areas: balance, endurance, locomotion, strength, transferring, bowel/bladder control, bathing, dressing, feeding, grooming, toileting and cognition 5. Can the patient actively participate in an intensive therapy program of at  least 3 hrs of therapy per day at least 5 days per week? Yes 6. The potential for patient to make measurable gains while on inpatient rehab is good and fair 7. Anticipated functional outcomes upon discharge from inpatient rehab are modified independent  with PT, modified independent with OT, modified independent with SLP. 8. Estimated rehab length of stay to reach the above functional goals is: TBD 9. Does the patient have adequate social supports and living environment to accommodate these discharge functional goals? Yes 10. Anticipated D/C setting: Home 11. Anticipated post D/C treatments: HH therapy and Outpatient therapy 12. Overall Rehab/Functional Prognosis: excellent  RECOMMENDATIONS: This patient's condition is appropriate for continued rehabilitative care in the following setting: see below Patient has agreed to participate in recommended program. Yes Note that insurance prior authorization may be required for reimbursement for recommended care.  Comment: Pt doing extremely well given diagnosis and hospital course. Will follow along for continued functional deficits as patient stabilizes from a neurosurgical standpoint. If he continues to have needs  when ready to discharge from NICU, then we could consider a brief inpatient rehab admit.   Ranelle Oyster, MD, [facility] Physical Medicine & Rehabilitation 03/07/2015     03/07/2015

## 2015-03-08 LAB — CBC
HCT: 44.5 % (ref 39.0–52.0)
Hemoglobin: 15.7 g/dL (ref 13.0–17.0)
MCH: 32.6 pg (ref 26.0–34.0)
MCHC: 35.3 g/dL (ref 30.0–36.0)
MCV: 92.3 fL (ref 78.0–100.0)
Platelets: 387 10*3/uL (ref 150–400)
RBC: 4.82 MIL/uL (ref 4.22–5.81)
RDW: 13.2 % (ref 11.5–15.5)
WBC: 13.5 10*3/uL — ABNORMAL HIGH (ref 4.0–10.5)

## 2015-03-08 LAB — BASIC METABOLIC PANEL
ANION GAP: 11 (ref 5–15)
BUN: 36 mg/dL — ABNORMAL HIGH (ref 6–20)
CALCIUM: 9.6 mg/dL (ref 8.9–10.3)
CO2: 21 mmol/L — ABNORMAL LOW (ref 22–32)
CREATININE: 0.96 mg/dL (ref 0.61–1.24)
Chloride: 103 mmol/L (ref 101–111)
GFR calc Af Amer: >60 mL/min (ref >60)
Glucose, Bld: 150 mg/dL — ABNORMAL HIGH (ref 65–99)
Potassium: 4.6 mmol/L (ref 3.5–5.1)
Sodium: 135 mmol/L (ref 135–145)

## 2015-03-08 LAB — PHOSPHORUS: Phosphorus: 3.5 mg/dL (ref 2.5–4.6)

## 2015-03-08 LAB — MAGNESIUM: Magnesium: 2.6 mg/dL — ABNORMAL HIGH (ref 1.7–2.4)

## 2015-03-08 NOTE — Progress Notes (Signed)
Physical Therapy Treatment Patient Details Name: Daniel Butler MRN: [id number] DOB: [birth year] Today's Date: 03/08/2015    History of Present Illness Patient is a 49 yo male admitted 03/02/15 with severe headache.  Patient with SAH, s/p clipping of Rt MCA aneurysm, with post-op VDRF.  Patient extubated 03/03/15.  PMH:  HTN, Gout    PT Comments    Pt continues to have balance and cognitive deficits.  Pt with poor awareness of deficits and feels it is all related to not wearing his good shoes.  Pt continues to be impulsive and grabs for lines without apparent need.  Continue to feel pt will need CIR level of therapy at D/C.  Will continue to follow.    Follow Up Recommendations  CIR     Equipment Recommendations  None recommended by PT    Recommendations for Other Services Rehab consult     Precautions / Restrictions Precautions Precautions: Fall Precaution Comments: ICP/Ventricular drainage catheter. Restrictions Weight Bearing Restrictions: No    Mobility  Bed Mobility Overal bed mobility: Needs Assistance Bed Mobility: Supine to Sit     Supine to sit: Min assist     General bed mobility comments: Pt impulsive.  Requires verbal cues for sequencing and problem solving as well as assist to lift trunk from bed   Transfers Overall transfer level: Needs assistance   Transfers: Sit to/from Stand Sit to Stand: Min assist         General transfer comment: pt uses LEs propped against bed to A with maintaining balance.  pt also likes to grab for lines.    Ambulation/Gait Ambulation/Gait assistance: Min assist;+2 safety/equipment Ambulation Distance (Feet): 100 Feet Assistive device: 1 person hand held assist Gait Pattern/deviations: Step-through pattern;Decreased stride length;Staggering right;Drifts right/left     General Gait Details: pt unsteady and worse when asked cognitive question during ambulation.  pt has to stop to attend to question.  When in standing  position, pt is able to read clock and signage in hallway, but unable to do so during mobility.     Stairs            Wheelchair Mobility    Modified Rankin (Stroke Patients Only) Modified Rankin (Stroke Patients Only) Pre-Morbid Rankin Score: No symptoms Modified Rankin: Moderately severe disability     Balance Overall balance assessment: Needs assistance Sitting-balance support: No upper extremity supported;Feet supported Sitting balance-Leahy Scale: Fair     Standing balance support: During functional activity Standing balance-Leahy Scale: Poor                      Cognition Arousal/Alertness: Awake/alert Behavior During Therapy: Impulsive Overall Cognitive Status: Impaired/Different from baseline Area of Impairment: Orientation;Attention;Memory;Following commands;Safety/judgement;Awareness;Problem solving Orientation Level: Disoriented to;Time Current Attention Level: Selective Memory: Decreased recall of precautions Following Commands: Follows multi-step commands with increased time;Follows multi-step commands inconsistently Safety/Judgement: Decreased awareness of safety;Decreased awareness of deficits Awareness: Intellectual Problem Solving: Difficulty sequencing;Requires verbal cues;Requires tactile cues General Comments: pt continues to be impulsive and with poor awareness of deficits.  pt thinks that if he simply puts on his shoes that he will be able to walk without A.  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      Exercises      General Comments  Pertinent Vitals/Pain Pain Assessment: No/denies pain    Home Living                      Prior Function            PT Goals (current goals can now be found in the care plan section) Acute Rehab PT Goals Patient Stated Goal: To go home  soon PT Goal Formulation: With patient Time For Goal Achievement: 03/11/15 Potential to Achieve Goals: Good Progress towards PT goals: Progressing toward goals    Frequency  Min 3X/week    PT Plan Current plan remains appropriate    Co-evaluation             End of Session Equipment Utilized During Treatment: Gait belt Activity Tolerance: Patient tolerated treatment well Patient left: in chair;with call bell/phone within reach;with chair alarm set     Time: 0902-0928 PT Time Calculation (min) (ACUTE ONLY): 26 min  Charges:  $Gait Training: 23-37 mins                    G CodesSunny Schlein, [place] [phone] 03/08/2015, 3:19 PM

## 2015-03-08 NOTE — Progress Notes (Signed)
Pt seen and examined this am. No complaints.  EXAM: Temp:  [97.8 F (36.6 C)-98.5 F (36.9 C)] 98.2 F (36.8 C) (06/29 2000) Pulse Rate:  [51-66] 65 (06/29 1900) Resp:  [13-21] 15 (06/29 1900) BP: (125-166)/(61-112) 125/81 mmHg (06/29 1900) SpO2:  [96 %-100 %] 99 % (06/29 1900) Intake/Output      06/29 0701 - 06/30 0700   Urine (mL/kg/hr) 800 (0.6)   Drains 46 (0)   Total Output 846   Net -846        Awake, alert CN intact Moving all extremities EVD in place, blood tinged CSF draining.  LABS: Lab Results  Component Value Date   CREATININE 0.96 03/08/2015   BUN 36* 03/08/2015   NA 135 03/08/2015   K 4.6 03/08/2015   CL 103 03/08/2015   CO2 21* 03/08/2015   Lab Results  Component Value Date   WBC 13.5* 03/08/2015   HGB 15.7 03/08/2015   HCT 44.5 03/08/2015   MCV 92.3 03/08/2015   PLT 387 03/08/2015    IMPRESSION: - 49 y.o. male SAH d# 9 s/p RMCA aneurysm clipping, remains neurologically stable  PLAN: - Cont mobilization - Cont obs for spasm - Will likely wean EVD later this week.

## 2015-03-08 NOTE — Progress Notes (Signed)
PULMONARY / CRITICAL CARE MEDICINE   Name: Daniel Butler MRN: [id number] DOB: [birth year]    ADMISSION DATE:  03/02/2015 CONSULTATION DATE:  03/08/2015  REFERRING MD :  Conchita Paris  CHIEF COMPLAINT:  VDRF following clipping of R MCA aneurysm.  INITIAL PRESENTATION:  49 y.o. 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  Underwent clipping and placement of ventriculostomy, then returned to ICU on the vent.  PCCM was called for vent management.   STUDIES:  CT head 6/23 >>> diffuse SAH with intraventricular hemorrhage and ventricular dilatation. 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  Aneurysm measures 7.4 x 4.11mm.  No significant vasospasm.  SIGNIFICANT EVENTS: 6/23 - admitted with [facility] due to rupture of R MCA aneurysm.  Taken to OR for clipping and ventriculostomy.  HISTORY OF PRESENT ILLNESS:  Pt is encephalopathic; therefore, this HPI is obtained from chart review. Daniel Butler is a 49 y.o. M with PMH of HTN, HLD, gout, who presented to [facility] ED 6/23 with sudden onset of severe HA 2 days prior while bending over to pick something up at his home.  He tried to place an icepack on his head which improved symptoms somewhat but not fully.  Symptoms persisted for the past 2 days therefore he decided to come to ED for further evaluation.  Pain has been severe enough at times to prevent him from sleeping or eating.  He had not experienced any vision changes, chest pain, SOB, N/V, weakness.  He does smoke with ~ 45 pack year hx.  He apparently stopped his antihypertensives 8 days ago (unclear reasons) but informed admitting MD that he was never told that needed to continue with them.  CT of the brain revealed a small SAH with minimal 3rd ventricle blood.  There was no hydrocephalus.  He was evaluated by Dr. Conchita Paris of neurosurgery.  He was taken to the OR and had a right frontotemporal craniotomy for clipping of R MCA aneurysm and placement of a ventriculostomy through  a bur hole.  He returned to the ICU on the ventilator and PCCM was consulted for vent management.  SUBJECTIVE: Extubated and doing well.  VITAL SIGNS: Temp:  [98.2 F (36.8 C)-98.5 F (36.9 C)] 98.5 F (36.9 C) (06/29 0744) Pulse Rate:  [51-67] 51 (06/29 0600) Resp:  [14-29] 14 (06/29 0600) BP: (145-185)/(61-112) 151/77 mmHg (06/29 0600) SpO2:  [97 %-100 %] 98 % (06/29 0600)   HEMODYNAMICS:   VENTILATOR SETTINGS:     INTAKE / OUTPUT: Intake/Output      06/28 0701 - 06/29 0700 06/29 0701 - 06/30 0700   I.V. (mL/kg)     Total Intake(mL/kg)     Urine (mL/kg/hr) 2100 (0.8)    Drains 135 (0.1) 9 (0)   Total Output 2235 9   Net -2235 -9         PHYSICAL EXAMINATION: General: Adult male, well appearing, in NAD. Neuro: Awake and following commands. Head: Skull with dressing with a drain in. EENT: PERRL, EOM-I and MMM Cardiovascular: RRR, no M/R/G.  Lungs: Respirations even and unlabored.  CTA bilaterally, No W/R/R. Abdomen: BS x 4, soft, NT/ND.  Musculoskeletal: No gross deformities, no edema.  Skin: Intact, warm, no rashes.  LABS:  CBC  Recent Labs Lab 03/05/15 2350 03/07/15 0245 03/08/15 0238  WBC 11.5* 11.1* 13.5*  HGB 12.7* 14.4 15.7  HCT 37.1* 41.7 44.5  PLT 306 335 387   Coag's No results for input(s): APTT,  INR in the last 168 hours. BMET  Recent Labs Lab 03/05/15 2350 03/07/15 0245 03/08/15 0238  NA 135 134* 135  K 4.1 4.2 4.6  CL 105 104 103  CO2 20* 18* 21*  BUN 22* 28* 36*  CREATININE 0.74 0.71 0.96  GLUCOSE 117* 136* 150*   Electrolytes  Recent Labs Lab 03/05/15 0210 03/05/15 2350 03/07/15 0245 03/08/15 0238  CALCIUM 8.8* 8.9 9.2 9.6  MG 2.6*  --  2.4 2.6*  PHOS 2.1*  --  3.5 3.5   Sepsis Markers  Recent Labs Lab 03/07/15 1120  LATICACIDVEN 1.2   ABG  Recent Labs Lab 03/02/15 2245 03/03/15 0405  PHART 7.273* 7.316*  PCO2ART 48.9* 43.6  PO2ART 68.8* 94.6   Liver Enzymes No results for input(s): AST, ALT,  ALKPHOS, BILITOT, ALBUMIN in the last 168 hours. Cardiac Enzymes No results for input(s): TROPONINI, PROBNP in the last 168 hours. Glucose No results for input(s): GLUCAP in the last 168 hours.  Imaging No results found.  I reviewed CXR myself, no signs of acute disease.  Discussed with bedside RN.  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 Conchita Paris). Acute encephalopathy due to above + sedation. P:   Post op care per neurosurgery. Plan to clamp drain in AM then likely remove the following day. Decadron, Keppra, Nimodipine, will defer to neuro. Percocet for pain. D/C IV narcs.  PULMONARY OETT 6/23 >>>6/25 A: VDRF following SAH with clipping of ruptured right MCA aneurysm. Tobacco use disorder. DIFFICULT AIRWAY PER ANESTHESIA P:   Titrate O2 to off. D/C further CXR and ABG Tobacco cessation counseling.  CARDIOVASCULAR A:  Hypertensive emergency - with subsequent SAH. Hx HTN, HLD. P:  Goal SBP < 160. Cardene gtt off. Labetalol PRN. Continue PO norvasc today to 10 mg daily. If increases will consider hydralazine or clonidine, no beta blockers given low HR.  RENAL A:   Hypokalemia Borderline hyponatremia Improving bicarb as chloride drop. P:   KVO IVF. Urine specific gravity 1.017 Urine level 137. BMP in AM. Correct electrolytes as indicated. F/U BMET.  GASTROINTESTINAL A:   GI prophylaxis. Nutrition. P:   SUP: Pantoprazole. Diet as ordered.  HEMATOLOGIC A:   VTE Prophylaxis. P:  SCD's. CBC in AM.  INFECTIOUS A:   No indication of infection. P:   Monitor clinically.  ENDOCRINE A:   No acute issues. P:   Monitor glucose on BMP.  Family updated: Patient updated bedside.  Drain in place.  Likely clamp drain in AM and remove later on this week.  Will continue to follow.  Alyson Reedy, M.D. [facility] Pulmonary/Critical Care Medicine. Pager:  [phone]. After hours pager: [phone].  03/08/2015 9:21 AM

## 2015-03-08 NOTE — Progress Notes (Signed)
Rehab admissions - Evaluated for possible admission.  Noted patient may be ready for inpatient rehab Friday.  I will follow along for progress and continued need for acute inpatient rehab admission.  Call me for questions.  #[phone]

## 2015-03-09 LAB — CBC
HEMATOCRIT: 45 % (ref 39.0–52.0)
Hemoglobin: 15.8 g/dL (ref 13.0–17.0)
MCH: 32.1 pg (ref 26.0–34.0)
MCHC: 35.1 g/dL (ref 30.0–36.0)
MCV: 91.5 fL (ref 78.0–100.0)
PLATELETS: 404 10*3/uL — AB (ref 150–400)
RBC: 4.92 MIL/uL (ref 4.22–5.81)
RDW: 13.1 % (ref 11.5–15.5)
WBC: 18.9 10*3/uL — AB (ref 4.0–10.5)

## 2015-03-09 LAB — MAGNESIUM: Magnesium: 2.6 mg/dL — ABNORMAL HIGH (ref 1.7–2.4)

## 2015-03-09 LAB — BASIC METABOLIC PANEL
ANION GAP: 11 (ref 5–15)
BUN: 36 mg/dL — ABNORMAL HIGH (ref 6–20)
CALCIUM: 9.2 mg/dL (ref 8.9–10.3)
CO2: 17 mmol/L — AB (ref 22–32)
CREATININE: 0.89 mg/dL (ref 0.61–1.24)
Chloride: 104 mmol/L (ref 101–111)
GFR calc Af Amer: >60 mL/min (ref >60)
GLUCOSE: 146 mg/dL — AB (ref 65–99)
Potassium: 4.5 mmol/L (ref 3.5–5.1)
SODIUM: 132 mmol/L — AB (ref 135–145)

## 2015-03-09 LAB — PHOSPHORUS: Phosphorus: 3.4 mg/dL (ref 2.5–4.6)

## 2015-03-09 NOTE — Progress Notes (Signed)
Rehab admissions - I met with patient and his wife this am.  Wife tells me that the EVD is to be removed tomorrow if all goes well with clamping today.  Patient is doing well with mobility and may not need inpatient rehab by the time he is medically ready for rehab or discharge.  I will continue to follow progress.  Call me for questions.  #[phone]

## 2015-03-09 NOTE — Progress Notes (Signed)
UR completed.    Kamaron Deskins W. Elaijah Munoz, RN, BSN  Trauma/Neuro ICU Case Manager [phone] 

## 2015-03-09 NOTE — Progress Notes (Signed)
Pt seen and examined. No issues overnight. Reports some HA this am, resolved now with pain medication. Otherwise no c/o.  EXAM: Temp:  [97.2 F (36.2 C)-98.8 F (37.1 C)] 98.8 F (37.1 C) (06/30 0757) Pulse Rate:  [54-69] 59 (06/30 0600) Resp:  [13-28] 15 (06/30 0600) BP: (125-170)/(54-109) 145/67 mmHg (06/30 0600) SpO2:  [96 %-100 %] 99 % (06/30 0600) Intake/Output      06/29 0701 - 06/30 0700 06/30 0701 - 07/01 0700   Urine (mL/kg/hr) 1850 (0.7)    Drains 127 (0)    Total Output 1977     Net -1977           Awake, alert, oriented Speech fluent CN intact Good strength throughout  LABS: Lab Results  Component Value Date   CREATININE 0.89 03/09/2015   BUN 36* 03/09/2015   NA 132* 03/09/2015   K 4.5 03/09/2015   CL 104 03/09/2015   CO2 17* 03/09/2015   Lab Results  Component Value Date   WBC 18.9* 03/09/2015   HGB 15.8 03/09/2015   HCT 45.0 03/09/2015   MCV 91.5 03/09/2015   PLT 404* 03/09/2015    IMPRESSION: - 49 y.o. male SAH d# 10 s/p RMCA aneurysm clipping, neurologically well  PLAN: - Clamp EVD today, will get [facility] tomorrow - Cont Nimotop - Cont to mobilize

## 2015-03-09 NOTE — Progress Notes (Signed)
Speech Language Pathology Treatment: Cognitive-Linquistic  Patient Details Name: Daniel Butler MRN: [id number] DOB: 06/12/1966 Today's Date: 03/09/2015 Time: 0923-3007 SLP Time Calculation (min) (ACUTE ONLY): 25 min  Assessment / Plan / Recommendation Clinical Impression  Pt demonstrates improving cognition today relative to sustained and selective attention and working memory.  Recalls events/details from days' events and verbal information from beginning to end of session.  Able to selectively attend to target stimuli and ignore distractions with no cues.  Insight improved but continues to demonstrate impairments in intellectual awareness.  Continue SLP services.    HPI Other Pertinent Information: Patient is a 49 yo male admitted 03/02/15 with severe headache. Patient with SAH, s/p clipping of Rt MCA aneurysm, with post-op VDRF. Patient extubated 03/03/15. PMH: HTN, Gout   Pertinent Vitals Pain Assessment: No/denies pain  SLP Plan    continue toward goals                     Prabhleen Montemayor L. Samson Frederic, [place] CCC/SLP Pager [phone]   Blenda Mounts Laurice 03/09/2015, 3:24 PM

## 2015-03-09 NOTE — Progress Notes (Signed)
PULMONARY / CRITICAL CARE MEDICINE   Name: Daniel Butler MRN: [id number] DOB: [birth year]    ADMISSION DATE:  03/02/2015 CONSULTATION DATE:  03/09/2015  REFERRING MD :  Conchita Paris  CHIEF COMPLAINT:  VDRF following clipping of R MCA aneurysm.  INITIAL PRESENTATION:  49 y.o. 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  Underwent clipping and placement of ventriculostomy, then returned to ICU on the vent.  PCCM was called for vent management.   STUDIES:  CT head 6/23 >>> diffuse SAH with intraventricular hemorrhage and ventricular dilatation. 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  Aneurysm measures 7.4 x 4.87mm.  No significant vasospasm.  SIGNIFICANT EVENTS: 6/23 - admitted with [facility] due to rupture of R MCA aneurysm.  Taken to OR for clipping and ventriculostomy.  HISTORY OF PRESENT ILLNESS:  Pt is encephalopathic; therefore, this HPI is obtained from chart review. Daniel Butler is a 50 y.o. M with PMH of HTN, HLD, gout, who presented to [facility] ED 6/23 with sudden onset of severe HA 2 days prior while bending over to pick something up at his home.  He tried to place an icepack on his head which improved symptoms somewhat but not fully.  Symptoms persisted for the past 2 days therefore he decided to come to ED for further evaluation.  Pain has been severe enough at times to prevent him from sleeping or eating.  He had not experienced any vision changes, chest pain, SOB, N/V, weakness.  He does smoke with ~ 45 pack year hx.  He apparently stopped his antihypertensives 8 days ago (unclear reasons) but informed admitting MD that he was never told that needed to continue with them.  CT of the brain revealed a small SAH with minimal 3rd ventricle blood.  There was no hydrocephalus.  He was evaluated by Dr. Conchita Paris of neurosurgery.  He was taken to the OR and had a right frontotemporal craniotomy for clipping of R MCA aneurysm and placement of a ventriculostomy through  a bur hole.  He returned to the ICU on the ventilator and PCCM was consulted for vent management.  SUBJECTIVE: Clamped IVC, will hold in ICU for monitoring.  VITAL SIGNS: Temp:  [97.2 F (36.2 C)-98.8 F (37.1 C)] 98.8 F (37.1 C) (06/30 0757) Pulse Rate:  [54-69] 59 (06/30 0600) Resp:  [13-28] 15 (06/30 0600) BP: (125-170)/(54-109) 145/67 mmHg (06/30 0600) SpO2:  [96 %-100 %] 99 % (06/30 0600)   HEMODYNAMICS:   VENTILATOR SETTINGS:    INTAKE / OUTPUT: Intake/Output      06/29 0701 - 06/30 0700 06/30 0701 - 07/01 0700   Urine (mL/kg/hr) 1850 (0.7)    Drains 127 (0)    Total Output 1977     Net -1977           PHYSICAL EXAMINATION: General: Adult male, well appearing, in NAD. Neuro: Awake and following commands. Head: Skull with dressing with a drain in. EENT: PERRL, EOM-I and MMM Cardiovascular: RRR, no M/R/G.  Lungs: Respirations even and unlabored.  CTA bilaterally, No W/R/R. Abdomen: BS x 4, soft, NT/ND.  Musculoskeletal: No gross deformities, no edema.  Skin: Intact, warm, no rashes.  LABS:  CBC  Recent Labs Lab 03/07/15 0245 03/08/15 0238 03/09/15 0230  WBC 11.1* 13.5* 18.9*  HGB 14.4 15.7 15.8  HCT 41.7 44.5 45.0  PLT 335 387 404*   Coag's No results for input(s): APTT, INR in the last 168 hours. BMET  Recent Labs Lab 03/07/15 0245 03/08/15 0238 03/09/15 0230  NA 134* 135 132*  K 4.2 4.6 4.5  CL 104 103 104  CO2 18* 21* 17*  BUN 28* 36* 36*  CREATININE 0.71 0.96 0.89  GLUCOSE 136* 150* 146*   Electrolytes  Recent Labs Lab 03/07/15 0245 03/08/15 0238 03/09/15 0230  CALCIUM 9.2 9.6 9.2  MG 2.4 2.6* 2.6*  PHOS 3.5 3.5 3.4   Sepsis Markers  Recent Labs Lab 03/07/15 1120  LATICACIDVEN 1.2   ABG  Recent Labs Lab 03/02/15 2245 03/03/15 0405  PHART 7.273* 7.316*  PCO2ART 48.9* 43.6  PO2ART 68.8* 94.6   Liver Enzymes No results for input(s): AST, ALT, ALKPHOS, BILITOT, ALBUMIN in the last 168 hours. Cardiac Enzymes No  results for input(s): TROPONINI, PROBNP in the last 168 hours. Glucose No results for input(s): GLUCAP in the last 168 hours.  Imaging No results found.  I reviewed transcranial dopplers myself, normal dopplers with normal flow.  Discussed with bedside RN.  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 Conchita Paris). Acute encephalopathy due to above + sedation. P:   Post op care per neurosurgery. Clamp drain today and monitor. Decadron, Keppra, Nimodipine, will defer to neuro. Percocet for pain. D/C IV narcs.  PULMONARY OETT 6/23 >>>6/25 A: VDRF following SAH with clipping of ruptured right MCA aneurysm. Tobacco use disorder. DIFFICULT AIRWAY PER ANESTHESIA P:   Titrate O2 to off. D/C further CXR and ABG Tobacco cessation counseling.  CARDIOVASCULAR A:  Hypertensive emergency - with subsequent SAH. Hx HTN, HLD. P:  Goal SBP < 160. Cardene gtt off. Labetalol PRN. Continue PO norvasc today to 10 mg daily. If increases will consider hydralazine or clonidine, no beta blockers given low HR.  RENAL A:   Hypokalemia Borderline hyponatremia Metabolic acidosis, likely hyperchloremia driven. P:   KVO IVF. Urine specific gravity 1.017 Urine level 137. BMP in AM. Correct electrolytes as indicated. F/U BMET.  GASTROINTESTINAL A:   GI prophylaxis. Nutrition. P:   SUP: Pantoprazole. Diet as ordered.  HEMATOLOGIC A:   VTE Prophylaxis. P:  SCD's. CBC in AM.  INFECTIOUS A:   No indication of infection. P:   Monitor clinically.  ENDOCRINE A:   No acute issues. P:   Monitor glucose on BMP.  Family updated: Patient updated bedside.  Drain in place, clamped.  PCCM following for medical management.  Alyson Reedy, M.D. [facility] Pulmonary/Critical Care Medicine. Pager: [phone]. After hours pager: [phone].  03/09/2015 10:32 AM

## 2015-03-09 NOTE — Progress Notes (Signed)
OT Cancellation Note  Patient Details Name: Daniel Butler MRN: [id number] DOB: Apr 12, 1966   Cancelled Treatment:    Reason Eval/Treat Not Completed: Fatigue/lethargy limiting ability to participate - Pt reports he didn't sleep last night and is trying to nap.  He and wife requested OT defer and reattempt.   Angelene Giovanni [place], OTR/L [phone]  03/09/2015, 10:55 AM

## 2015-03-10 ENCOUNTER — Encounter (HOSPITAL_COMMUNITY): Payer: Self-pay | Admitting: Radiology

## 2015-03-10 ENCOUNTER — Inpatient Hospital Stay (HOSPITAL_COMMUNITY): Payer: Self-pay

## 2015-03-10 MED ORDER — DEXAMETHASONE 4 MG PO TABS
4.0000 mg | ORAL_TABLET | Freq: Three times a day (TID) | ORAL | Status: DC
  Administered 2015-03-10 – 2015-03-12 (×7): 4 mg via ORAL
  Filled 2015-03-10 (×9): qty 1

## 2015-03-10 NOTE — Progress Notes (Signed)
PULMONARY / CRITICAL CARE MEDICINE   Name: Daniel Butler MRN: [id number] DOB: June 07, 1966    ADMISSION DATE:  03/02/2015 CONSULTATION DATE:  03/10/2015  REFERRING MD :  Conchita Paris  CHIEF COMPLAINT:  VDRF following clipping of R MCA aneurysm.  INITIAL PRESENTATION:  49 y.o. 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  Underwent clipping and placement of ventriculostomy, then returned to ICU on the vent.  PCCM was called for vent management.   STUDIES:  CT head 6/23 >>> diffuse SAH with intraventricular hemorrhage and ventricular dilatation. 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  Aneurysm measures 7.4 x 4.47mm.  No significant vasospasm.  SIGNIFICANT EVENTS: 6/23 - admitted with [facility] due to rupture of R MCA aneurysm.  Taken to OR for clipping and ventriculostomy.  HISTORY OF PRESENT ILLNESS:  Pt is encephalopathic; therefore, this HPI is obtained from chart review. Daniel Butler is a 49 y.o. M with PMH of HTN, HLD, gout, who presented to [facility] ED 6/23 with sudden onset of severe HA 2 days prior while bending over to pick something up at his home.  He tried to place an icepack on his head which improved symptoms somewhat but not fully.  Symptoms persisted for the past 2 days therefore he decided to come to ED for further evaluation.  Pain has been severe enough at times to prevent him from sleeping or eating.  He had not experienced any vision changes, chest pain, SOB, N/V, weakness.  He does smoke with ~ 45 pack year hx.  He apparently stopped his antihypertensives 8 days ago (unclear reasons) but informed admitting MD that he was never told that needed to continue with them.  CT of the brain revealed a small SAH with minimal 3rd ventricle blood.  There was no hydrocephalus.  He was evaluated by Dr. Conchita Paris of neurosurgery.  He was taken to the OR and had a right frontotemporal craniotomy for clipping of R MCA aneurysm and placement of a ventriculostomy through  a bur hole.  He returned to the ICU on the ventilator and PCCM was consulted for vent management.  SUBJECTIVE: Clamped IVC, will hold in ICU for monitoring.  VITAL SIGNS: Temp:  [97.2 F (36.2 C)-98.3 F (36.8 C)] 97.2 F (36.2 C) (07/01 0800) Pulse Rate:  [51-67] 55 (07/01 0815) Resp:  [12-28] 16 (07/01 0815) BP: (120-167)/(46-91) 166/89 mmHg (07/01 0815) SpO2:  [97 %-100 %] 100 % (07/01 0815)   HEMODYNAMICS:   VENTILATOR SETTINGS:    INTAKE / OUTPUT: Intake/Output      06/30 0701 - 07/01 0700 07/01 0701 - 07/02 0700   P.O. 800    Total Intake(mL/kg) 800 (7.5)    Urine (mL/kg/hr) 1500 (0.6) 300 (1.4)   Drains 5 (0)    Stool 0 (0)    Total Output 1505 300   Net -705 -300        Urine Occurrence 2 x    Stool Occurrence 1 x     PHYSICAL EXAMINATION: General: Adult male, well appearing, in NAD.  Right sided fluid collection noted on the head, fluctuant. Neuro: Awake and following commands. Head: Skull with dressing with a drain in. EENT: PERRL, EOM-I and MMM Cardiovascular: RRR, no M/R/G.  Lungs: Respirations even and unlabored.  CTA bilaterally, No W/R/R. Abdomen: BS x 4, soft, NT/ND.  Musculoskeletal: No gross deformities, no edema.  Skin: Intact, warm, no rashes.  LABS:  CBC  Recent Labs Lab 03/07/15  0245 03/08/15 0238 03/09/15 0230  WBC 11.1* 13.5* 18.9*  HGB 14.4 15.7 15.8  HCT 41.7 44.5 45.0  PLT 335 387 404*   Coag's No results for input(s): APTT, INR in the last 168 hours. BMET  Recent Labs Lab 03/07/15 0245 03/08/15 0238 03/09/15 0230  NA 134* 135 132*  K 4.2 4.6 4.5  CL 104 103 104  CO2 18* 21* 17*  BUN 28* 36* 36*  CREATININE 0.71 0.96 0.89  GLUCOSE 136* 150* 146*   Electrolytes  Recent Labs Lab 03/07/15 0245 03/08/15 0238 03/09/15 0230  CALCIUM 9.2 9.6 9.2  MG 2.4 2.6* 2.6*  PHOS 3.5 3.5 3.4   Sepsis Markers  Recent Labs Lab 03/07/15 1120  LATICACIDVEN 1.2   ABG No results for input(s): PHART, PCO2ART, PO2ART in  the last 168 hours. Liver Enzymes No results for input(s): AST, ALT, ALKPHOS, BILITOT, ALBUMIN in the last 168 hours. Cardiac Enzymes No results for input(s): TROPONINI, PROBNP in the last 168 hours. Glucose No results for input(s): GLUCAP in the last 168 hours.  Imaging Ct Head Wo Contrast  03/10/2015   CLINICAL DATA:  Hydrocephalus. Ruptured right MCA territory aneurysm. Craniotomy for clipping of right MCA territory aneurysm.  EXAM: CT HEAD WITHOUT CONTRAST  TECHNIQUE: Contiguous axial images were obtained from the base of the skull through the vertex without intravenous contrast.  COMPARISON:  CTA head 03/02/2015.  CT head 03/03/2015.  FINDINGS: A right pterional craniotomy is noted. A clip across the right MCA bifurcation aneurysm is stable in position. Encephalomalacia of the anterior right frontal and temporal lobe is more pronounced on today's exam with areas of cytotoxic edema evident. Pneumocephalus has significantly improved. A small extra-axial collection is noted subjacent to the craniotomy site. Subarachnoid hemorrhage continues to resolve. No new hemorrhage is present.  The right frontal ventriculostomy catheter has been pulled back and does not clearly extend into the right lateral ventricle.  The paranasal sinuses are clear. An extracranial fluid collection is again noted on the right external to the craniotomy. There is a fluid level in this collection. The collection measures up to 11 mm in depth. The extent of the collection is similar to the prior study.  IMPRESSION: 1. No hydrocephalus. 2. The ventricular catheter has been pulled back and tip appears to be external to the ventricles. 3. Near complete resolution of pneumocephalus with only a very small extra-axial fluid collection remaining subjacent to the craniotomy site. 4. Stable positioning of a right MCA aneurysm clip following right pterional craniotomy. 5. Evolution of cytotoxic edema within the anterior right frontal lobe and  temporal lobe adjacent to the aneurysm clip. 6. Continued resolution of subarachnoid hemorrhage without evidence for new hemorrhage.   Electronically Signed   By: Marin Roberts M.D.   On: 03/10/2015 07:16    I reviewed CT myself, ventricles normal but subcutaneous collection of CSF noted.  Discussed with bedside RN and neurology.  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 Conchita Paris). Acute encephalopathy due to above + sedation. P:   Post op care per neurosurgery. Drain is clamped but with fluid collection will need to be seen by NSG to determine how to address leak. Decadron, Keppra, Nimodipine, will defer to neuro. Percocet for pain. D/C IV narcs.  PULMONARY OETT 6/23 >>>6/25 A: VDRF following SAH with clipping of ruptured right MCA aneurysm. Tobacco use disorder. DIFFICULT AIRWAY PER ANESTHESIA P:  Titrate O2 to off. D/C further CXR and ABG Tobacco cessation counseling.  CARDIOVASCULAR A:  Hypertensive emergency - with subsequent SAH. Hx HTN, HLD. P:  Goal SBP < 160. Cardene gtt off. Labetalol PRN. Continue PO norvasc today to 10 mg daily. If increases will consider hydralazine or clonidine, no beta blockers given low HR.  RENAL A:   Hypokalemia Borderline hyponatremia Metabolic acidosis, likely hyperchloremia driven. P:   KVO IVF. Urine specific gravity 1.017 Urine level 137. BMP in AM. Correct electrolytes as indicated. F/U BMET.  GASTROINTESTINAL A:   GI prophylaxis. Nutrition. P:   SUP: Pantoprazole. Diet as ordered.  HEMATOLOGIC A:   VTE Prophylaxis. P:  SCD's. CBC in AM.  INFECTIOUS A:   No indication of infection. P:   Monitor clinically.  ENDOCRINE A:   No acute issues. P:   Monitor glucose on BMP.  Family updated: Patient updated bedside.  Drain in place, clamped.  PCCM following for medical management.  PCCM will see again on  Tuesday.  Alyson Reedy, M.D. [facility] ([facility]) Pulmonary/Critical Care Medicine. Pager: [phone]. After hours pager: [phone].  03/10/2015 9:00 AM

## 2015-03-10 NOTE — Progress Notes (Signed)
Physical Therapy Treatment Patient Details Name: Daniel Butler MRN: [id number] DOB: 1966-02-18 Today's Date: 03/10/2015    History of Present Illness Patient is a 49 yo male admitted 03/02/15 with severe headache.  Patient with SAH, s/p clipping of Rt MCA aneurysm, with post-op VDRF.  Patient extubated 03/03/15.  PMH:  HTN, Gout    PT Comments    Pt much improved this session as compared to last session.  Pt able to multitask and perform money math problems while ambulating.  Feel if pt continues to improve after drain is removed, he may no longer require CIR for therapies and would be able to return to home.  Will continue to follow.    Follow Up Recommendations  CIR     Equipment Recommendations  None recommended by PT    Recommendations for Other Services       Precautions / Restrictions Precautions Precautions: Fall Precaution Comments: ICP/Ventricular drainage catheter. Restrictions Weight Bearing Restrictions: No    Mobility  Bed Mobility Overal bed mobility: Needs Assistance Bed Mobility: Supine to Sit     Supine to sit: Supervision     General bed mobility comments: pt continues to be impulsive, but no physical A needed to come to sitting.    Transfers Overall transfer level: Needs assistance Equipment used: None Transfers: Sit to/from Stand Sit to Stand: Min guard         General transfer comment: cues to slow downa nd attend to balance and lines.    Ambulation/Gait Ambulation/Gait assistance: Min guard Ambulation Distance (Feet): 150 Feet Assistive device: 1 person hand held assist Gait Pattern/deviations: Step-through pattern;Decreased stride length;Drifts right/left     General Gait Details: pt moves slowly, but demonstrates improved balance despite c/o feeling stiff and mildly antalgic.  pt able to continue ambulating while reading signage and performing money math problems.     Stairs            Wheelchair Mobility    Modified  Rankin (Stroke Patients Only) Modified Rankin (Stroke Patients Only) Pre-Morbid Rankin Score: No symptoms Modified Rankin: Moderately severe disability     Balance Overall balance assessment: Needs assistance Sitting-balance support: No upper extremity supported;Feet supported Sitting balance-Leahy Scale: Fair     Standing balance support: During functional activity Standing balance-Leahy Scale: Fair                      Cognition Arousal/Alertness: Awake/alert Behavior During Therapy: Impulsive Overall Cognitive Status: Impaired/Different from baseline Area of Impairment: Attention;Safety/judgement;Problem solving;Awareness   Current Attention Level: Alternating     Safety/Judgement: Decreased awareness of safety;Decreased awareness of deficits Awareness: Emergent Problem Solving: Difficulty sequencing;Requires verbal cues General Comments: pt cognition much improved today and able to perform cognitive money tasks while ambulating.  pt continues to have poor awareness of deficits and feels if he had his shoes he wouldn't have any deficits.      Exercises      General Comments        Pertinent Vitals/Pain Pain Assessment: No/denies pain    Home Living                      Prior Function            PT Goals (current goals can now be found in the care plan section) Acute Rehab PT Goals Patient Stated Goal: To go home soon PT Goal Formulation: With patient Time For Goal Achievement: 03/11/15 Potential to Achieve Goals: Good  Progress towards PT goals: Progressing toward goals    Frequency  Min 3X/week    PT Plan Current plan remains appropriate    Co-evaluation             End of Session Equipment Utilized During Treatment: Gait belt Activity Tolerance: Patient tolerated treatment well Patient left: in chair;with call bell/phone within reach;with family/visitor present     Time: 7902-4097 PT Time Calculation (min) (ACUTE ONLY):  14 min  Charges:  $Gait Training: 8-22 mins                    G CodesSunny Schlein, [place] [phone] 03/10/2015, 2:52 PM

## 2015-03-10 NOTE — Progress Notes (Signed)
Pt seen and examined. No issues overnight. No complaints.  EXAM: Temp:  [97.2 F (36.2 C)-98.3 F (36.8 C)] 97.5 F (36.4 C) (07/01 1144) Pulse Rate:  [51-68] 57 (07/01 1400) Resp:  [12-28] 16 (07/01 1400) BP: (133-168)/(63-91) 164/82 mmHg (07/01 1400) SpO2:  [97 %-100 %] 99 % (07/01 1400) Intake/Output      06/30 0701 - 07/01 0700 07/01 0701 - 07/02 0700   P.O. 800    Total Intake(mL/kg) 800 (7.5)    Urine (mL/kg/hr) 1500 (0.6) 700 (0.8)   Drains 5 (0)    Stool 0 (0)    Total Output 1505 700   Net -705 -700        Urine Occurrence 2 x    Stool Occurrence 1 x     Awake, alert, oriented Speech fluent CN intact Moving all extremities well Has a subcutaneous fluid collection, may be CSF, no leak from skin.  LABS: Lab Results  Component Value Date   CREATININE 0.89 03/09/2015   BUN 36* 03/09/2015   NA 132* 03/09/2015   K 4.5 03/09/2015   CL 104 03/09/2015   CO2 17* 03/09/2015   Lab Results  Component Value Date   WBC 18.9* 03/09/2015   HGB 15.8 03/09/2015   HCT 45.0 03/09/2015   MCV 91.5 03/09/2015   PLT 404* 03/09/2015    IMAGING: CTH reviewed, no HCP  IMPRESSION: - 49 y.o. male SAH d# 11 s/p RMCA aneurysm clipping, neurologically well.  PLAN: - D/C'ed evd at bedside - Will monitor in ICU, for a day or two. Can likely transfer to floor if stable over the weekend. Will plan on CIR next week.

## 2015-03-10 NOTE — Progress Notes (Signed)
Occupational Therapy Treatment Patient Details Name: Daniel Butler MRN: [id number] DOB: 19-Jan-1966 Today's Date: 03/10/2015    History of present illness Patient is a 49 yo male admitted 03/02/15 with severe headache.  Patient with SAH, s/p clipping of Rt MCA aneurysm, with post-op VDRF.  Patient extubated 03/03/15.  PMH:  HTN, Gout   OT comments  Pt with significant improvement with ability to perform ADLs, though still not initiating them, and refusing to bathe daily.  Pt requires min guard assist for balance.  Cognition improving, but still with decreased awareness, poor self monitoring, inappropriate use of humor, impaired problems solving.  May be able to go home with OPOT if he continues to progress as he is currently.  Follow Up Recommendations  CIR    Equipment Recommendations  Tub/shower bench    Recommendations for Other Services      Precautions / Restrictions Precautions Precautions: Fall Restrictions Weight Bearing Restrictions: No       Mobility Bed Mobility Overal bed mobility: Needs Assistance Bed Mobility: Supine to Sit     Supine to sit: Supervision        Transfers Overall transfer level: Needs assistance   Transfers: Sit to/from Stand;Stand Pivot Transfers Sit to Stand: Supervision Stand pivot transfers: Supervision            Balance Overall balance assessment: Needs assistance Sitting-balance support: No upper extremity supported Sitting balance-Leahy Scale: Good     Standing balance support: During functional activity Standing balance-Leahy Scale: Good                     ADL Overall ADL's : Needs assistance/impaired     Grooming: Wash/dry hands;Wash/dry face;Oral care;Supervision/safety;Standing   Upper Body Bathing: Supervision/ safety;Standing   Lower Body Bathing: Minimal assistance;Sit to/from stand   Upper Body Dressing : Set up;Supervision/safety;Sitting   Lower Body Dressing: Sit to/from stand;Min guard    Toilet Transfer: Ambulation;Comfort height toilet;Min guard   Toileting- Clothing Manipulation and Hygiene: Sit to/from stand;Min guard       Functional mobility during ADLs: Min guard General ADL Comments: wife present, and instructed in symptoms of TBI, behavioral changes, etc.       Vision                     Perception     Praxis      Cognition   Behavior During Therapy: WFL for tasks assessed/performed;Impulsive Overall Cognitive Status: Impaired/Different from baseline Area of Impairment: Attention;Awareness;Problem solving;Safety/judgement   Current Attention Level: Alternating      Safety/Judgement: Decreased awareness of safety;Decreased awareness of deficits Awareness: Intellectual Problem Solving: Decreased initiation;Difficulty sequencing;Requires verbal cues;Requires tactile cues General Comments: Pt still not initiating ADLs, and is often refusing to bathe.  Pt continues to use humor inappropritately at times with decreased awareness and decreased ability to self monitor.     Extremity/Trunk Assessment               Exercises     Shoulder Instructions       General Comments      Pertinent Vitals/ Pain       Pain Assessment: No/denies pain  Home Living                                          Prior Functioning/Environment  Frequency       Progress Toward Goals  OT Goals(current goals can now be found in the care plan section)  Progress towards OT goals: Progressing toward goals  ADL Goals Pt Will Perform Grooming: with supervision;standing Pt Will Perform Upper Body Bathing: with set-up;sitting Pt Will Perform Lower Body Bathing: with supervision;sit to/from stand Pt Will Perform Upper Body Dressing: with set-up;sitting Pt Will Perform Lower Body Dressing: with supervision;sit to/from stand Pt Will Transfer to Toilet: with supervision;ambulating;regular height toilet;bedside commode;grab  bars Pt Will Perform Toileting - Clothing Manipulation and hygiene: with supervision;sit to/from stand Pt Will Perform Tub/Shower Transfer: Tub transfer;Shower transfer;with min guard assist;ambulating;shower seat;grab bars Additional ADL Goal #1: Pt will initiate am ADLs with no cues Additional ADL Goal #2: Pt will demonstrate emergent awareness of deficits   Plan Discharge plan remains appropriate    Co-evaluation                 End of Session     Activity Tolerance Patient tolerated treatment well   Patient Left in chair;with call bell/phone within reach;with family/visitor present   Nurse Communication Mobility status        Time: 4098-1191 OT Time Calculation (min): 39 min  Charges: OT General Charges $OT Visit: 1 Procedure OT Treatments $Self Care/Home Management : 38-52 mins  Tysheka Fanguy M 03/10/2015, 11:18 PM

## 2015-03-11 LAB — PHOSPHORUS: PHOSPHORUS: 3.3 mg/dL (ref 2.5–4.6)

## 2015-03-11 LAB — CBC
HEMATOCRIT: 44.2 % (ref 39.0–52.0)
Hemoglobin: 15.3 g/dL (ref 13.0–17.0)
MCH: 31.5 pg (ref 26.0–34.0)
MCHC: 34.6 g/dL (ref 30.0–36.0)
MCV: 90.9 fL (ref 78.0–100.0)
Platelets: 386 10*3/uL (ref 150–400)
RBC: 4.86 MIL/uL (ref 4.22–5.81)
RDW: 12.9 % (ref 11.5–15.5)
WBC: 18.7 10*3/uL — ABNORMAL HIGH (ref 4.0–10.5)

## 2015-03-11 LAB — BASIC METABOLIC PANEL
ANION GAP: 10 (ref 5–15)
BUN: 27 mg/dL — ABNORMAL HIGH (ref 6–20)
CALCIUM: 9.1 mg/dL (ref 8.9–10.3)
CHLORIDE: 100 mmol/L — AB (ref 101–111)
CO2: 22 mmol/L (ref 22–32)
Creatinine, Ser: 0.99 mg/dL (ref 0.61–1.24)
GFR calc Af Amer: >60 mL/min (ref >60)
Glucose, Bld: 162 mg/dL — ABNORMAL HIGH (ref 65–99)
POTASSIUM: 4.4 mmol/L (ref 3.5–5.1)
Sodium: 132 mmol/L — ABNORMAL LOW (ref 135–145)

## 2015-03-11 LAB — MAGNESIUM: Magnesium: 2.4 mg/dL (ref 1.7–2.4)

## 2015-03-11 NOTE — Progress Notes (Signed)
Physical Therapy Treatment Patient Details Name: Daniel Butler MRN: [id number] DOB: 01-17-1966 Today's Date: 03/11/2015    History of Present Illness Patient is a 49 yo male admitted 03/02/15 with severe headache.  Patient with SAH, s/p clipping of Rt MCA aneurysm, with post-op VDRF.  Patient extubated 03/03/15.  PMH:  HTN, Gout    PT Comments    Pt con't to improve however today with c/o of R knee pain requiring use of RW for safe ambulation this date. If pt con't to progress at this rate suspect pt will be able to go home with spouse and not need CIR services upon d/c.  Follow Up Recommendations  CIR     Equipment Recommendations  None recommended by PT    Recommendations for Other Services       Precautions / Restrictions Precautions Precautions: Fall Restrictions Weight Bearing Restrictions: No    Mobility  Bed Mobility Overal bed mobility: Modified Independent Bed Mobility: Supine to Sit;Sit to Supine     Supine to sit: Modified independent (Device/Increase time) Sit to supine: Modified independent (Device/Increase time)   General bed mobility comments: pt with safe technique  Transfers Overall transfer level: Needs assistance Equipment used: None Transfers: Sit to/from Stand Sit to Stand: Supervision         General transfer comment: initially didn't use RW and pt guarded with antalgic limp due to R knee pain  Ambulation/Gait Ambulation/Gait assistance: Min guard Ambulation Distance (Feet): 500 Feet Assistive device: Rolling walker (2 wheeled) Gait Pattern/deviations: Step-through pattern;Antalgic     General Gait Details: had to use RW this date due R LE pain. pt much improved from stability stand point with use of RW due to R le offweighting   Stairs Stairs: Yes   Stair Management: One rail Left;Step to pattern Number of Stairs: 5 General stair comments: pt with good technique  Wheelchair Mobility    Modified Rankin (Stroke Patients  Only) Modified Rankin (Stroke Patients Only) Pre-Morbid Rankin Score: No symptoms Modified Rankin: Moderate disability     Balance           Standing balance support: During functional activity   Standing balance comment: needs RW for amb                    Cognition Arousal/Alertness: Awake/alert Behavior During Therapy: WFL for tasks assessed/performed Overall Cognitive Status: Within Functional Limits for tasks assessed                 General Comments: pt able to carry on appropriate conversation. pt aware of R LE pain upon amb and requested RW to assist with ambulation    Exercises      General Comments        Pertinent Vitals/Pain Pain Assessment: Faces Faces Pain Scale: Hurts even more Pain Location: R knee-reports "i have gout"    Home Living                      Prior Function            PT Goals (current goals can now be found in the care plan section) Acute Rehab PT Goals Patient Stated Goal: go home Progress towards PT goals: Progressing toward goals    Frequency  Min 3X/week    PT Plan Current plan remains appropriate    Co-evaluation             End of Session Equipment Utilized During Treatment: Gait belt  Activity Tolerance: Patient tolerated treatment well Patient left: in bed;with call bell/phone within reach;with bed alarm set     Time: 1434-1453 PT Time Calculation (min) (ACUTE ONLY): 19 min  Charges:  $Gait Training: 8-22 mins                    G Codes:      Marcene Brawn 03/11/2015, 3:37 PM  [name], PT, DPT Pager #: [phone] Office #: [phone] \

## 2015-03-11 NOTE — Progress Notes (Signed)
Patient admitted to 4N05. Patient denies any pain at this time, patient is neuro intact, A&Ox4, denies headache. Staples to right head are open to air, dry and intact, no redness, tenderness, or drainage. Right femoral at level 0. Patient oriented to room, unit and staff. Patient vocalized understanding of fall prevention policy, will ask for staff assistance prior to ambulation. Call bell within patient's reach. Will continue to monitor closely.

## 2015-03-11 NOTE — Progress Notes (Signed)
Overall doing well. Minimal headache. No other complaints.  Afebrile. Vitals are stable. Awake and alert. Oriented and appropriate. Eating and drinking well. Patient states that he feels good. Motor 5/5 bilaterally. No pronator drift. Cranial nerves intact. Wound clean and dry.  Progressing very well. Transfer to floor.

## 2015-03-12 MED ORDER — NIMODIPINE 30 MG PO CAPS: 60.0000 mg | ORAL_CAPSULE | ORAL | Status: DC

## 2015-03-12 MED ORDER — HYDROCODONE-ACETAMINOPHEN 5-325 MG PO TABS: 1.0000 | ORAL_TABLET | ORAL | Status: DC | PRN

## 2015-03-12 MED ORDER — LEVETIRACETAM 500 MG PO TABS: 500.0000 mg | ORAL_TABLET | Freq: Two times a day (BID) | ORAL | Status: DC

## 2015-03-12 NOTE — Discharge Summary (Signed)
Physician Discharge Summary  Patient ID: Daniel Butler MRN: [id number] DOB/AGE: [birth year] 50 y.o.  Admit date: 03/02/2015 Discharge date: 03/12/2015  Admission Diagnoses:  Aneurysmal subarachnoid hemorrhage, right middle cerebral artery aneurysm  Discharge Diagnoses:  Aneurysmal subarachnoid hemorrhage, right middle cerebral artery aneurysm Active Problems:   Subarachnoid hemorrhage   Acute respiratory failure with hypoxia   SAH (subarachnoid hemorrhage)   Subarachnoid bleed   Malignant hypertension   Encounter for orogastric (OG) tube placement   Discharged Condition: good  Hospital Course: Patient was admitted by Dr. Conchita Paris after having suffered a severe headache 2 days earlier. The headache persisted and the patient presented to the emergency room. CT revealed a right sylvian fissure and right convexity subarachnoid hemorrhage. CT angiogram revealed a wide-based right middle cerebral artery aneurysm. Patient was taken to angiography by Dr. Conchita Paris, which confirmed the right middle cerebral artery aneurysm, and he then took him to surgery for a right frontotemporal craniotomy for clipping of the aneurysm, as well as placement of an IVC. Postoperatively his care was assisted by the PCCM service, he was initially supported on the ventilator, but eventually extubated. He was seen by PT and OT. The IVC was weaned and eventually removed. His incision is healing nicely, but there is a moderate subgaleal effusion. He is up and ambulate actively in the halls. On examination he is awake and alert, oriented. Following commands. Speech is fluent. Moving all 4 extremity is well. No drift of upper extremities. He is asking to be discharged to home. He has been given a written prescription for a Medrol dosepak, for steroid taper. He is also been given to prescription for Nimotop, one is for 12 capsules which we filled by the [facility] hospital pharmacy prior to discharge and given to the patient  at discharge, the other is for 96 capsules which is going to fill in 2 days at the [facility] outpatient pharmacy, and which she'll continue for another 8 days and then discontinue the Nimotop. He is also been given a prescription for Keppra 500 twice a day and Norco 5/325 when necessary pain. He is to return for follow-up with Dr. Conchita Paris in 3-4 days for consideration of staple removal and additional post operative instructions and care.   Consults: pulmonary/intensive care  Significant Diagnostic Studies: angiography: Cerebral  Discharge Exam: Blood pressure 157/79, pulse 58, temperature 98.1 F (36.7 C), temperature source Oral, resp. rate 20, height 6\' 1"  (1.854 m), weight 106.595 kg (235 lb), SpO2 99 %.  Disposition: Home    Medication List    STOP taking these medications        GOODY HEADACHE PO     ibuprofen 200 MG tablet  Commonly known as:  ADVIL,MOTRIN      TAKE these medications        HYDROcodone-acetaminophen 5-325 MG per tablet  Commonly known as:  NORCO  Take 1-2 tablets by mouth every 4 (four) hours as needed (pain).     levETIRAcetam 500 MG tablet  Commonly known as:  KEPPRA  Take 1 tablet (500 mg total) by mouth 2 (two) times daily.     niMODipine 30 MG capsule  Commonly known as:  NIMOTOP  Take 2 capsules (60 mg total) by mouth every 4 (four) hours.         SignedHewitt Shorts 03/12/2015, 10:11 AM

## 2015-03-12 NOTE — Care Management Note (Signed)
Case Management Note  Patient Details  Name: GINA MCGLADE MRN: [id number] Date of Birth: 07-20-1966  Subjective/Objective:                   Aneurysmal subarachnoid hemorrhage, right middle cerebral artery aneurysm Action/Plan: Cm received call from RN reqeusting a MATCH for pt who's medications cannot be substituted.  CM called ASST Director who did an override MATCH.  CM delivered keppra request and nimotop request to our internal pharmacy for enough medication until pt can bring the [facility] letter to the [facility] outpt pharmacy.  Jeannie, CM delivered both medication and MATCH and counseled pt on both.  No other CM needs communicated.  Expected Discharge Date:                  Expected Discharge Plan:  Home/Self Care  In-House Referral:     Discharge planning Services  CM Consult, MATCH Program  Post Acute Care Choice:    Choice offered to:     DME Arranged:    DME Agency:     HH Arranged:    HH Agency:     Status of Service:  Completed, signed off  Medicare Important Message Given:    Date Medicare IM Given:    Medicare IM give by:    Date Additional Medicare IM Given:    Additional Medicare Important Message give by:     If discussed at Long Length of Stay Meetings, dates discussed:    Additional Comments:  Yves Dill, RN 03/12/2015, 4:41 PM

## 2015-03-12 NOTE — Progress Notes (Signed)
Pt discharging at this time with wife taking all personal belongings. Pt stable, neuro intact. Discharge instructions and prescriptions provided with verbal understanding. Pt made aware to follow up with MD. Pt has medication available until Tuesday per pharmacist Huntley Dec. Surgical staple site clean, dry and intact. No drainage. IV discontinued, dry dressing applied.

## 2015-03-12 NOTE — Care Management Note (Signed)
Case Management Note  Patient Details  Name: RALLY SEGUR MRN: [id number] Date of Birth: May 23, 1966  Subjective/Objective:                    Action/Plan:   Expected Discharge Date:                  Expected Discharge Plan:  IP Rehab Facility  In-House Referral:     Discharge planning Services  CM Consult, [facility] Program  Post Acute Care Choice:    Choice offered to:     DME Arranged:    DME Agency:     HH Arranged:    HH Agency:     Status of Service:  Completed, signed off  Medicare Important Message Given:    Date Medicare IM Given:    Medicare IM give by:    Date Additional Medicare IM Given:    Additional Medicare Important Message give by:     If discussed at Long Length of Stay Meetings, dates discussed:    Additional Comments:  Yves Dill, RN 03/12/2015, 4:40 PM

## 2015-03-12 NOTE — Progress Notes (Signed)
Pt ambulated with this nurse in the hall way 200 ft with rolling walker, stand by assist. Gait steady, no noted distress. Will continue to monitor.

## 2015-03-12 NOTE — Progress Notes (Signed)
Cost overrides have been entered into [facility] system for nimodipine and Keppra.

## 2015-03-23 ENCOUNTER — Emergency Department (INDEPENDENT_AMBULATORY_CARE_PROVIDER_SITE_OTHER): Payer: Self-pay

## 2015-03-23 ENCOUNTER — Emergency Department (HOSPITAL_COMMUNITY)
Admission: EM | Admit: 2015-03-23 | Discharge: 2015-03-23 | Disposition: A | Discharge status: Home / Self Care | Payer: Self-pay | Attending: Emergency Medicine | Admitting: Emergency Medicine

## 2015-03-23 ENCOUNTER — Encounter (HOSPITAL_COMMUNITY): Payer: Self-pay | Admitting: Emergency Medicine

## 2015-03-23 DIAGNOSIS — Z720 Tobacco use: Secondary | ICD-10-CM | POA: Insufficient documentation

## 2015-03-23 DIAGNOSIS — M25461 Effusion, right knee: Secondary | ICD-10-CM | POA: Insufficient documentation

## 2015-03-23 DIAGNOSIS — Z79899 Other long term (current) drug therapy: Secondary | ICD-10-CM | POA: Insufficient documentation

## 2015-03-23 DIAGNOSIS — M25411 Effusion, right shoulder: Secondary | ICD-10-CM | POA: Insufficient documentation

## 2015-03-23 DIAGNOSIS — M25412 Effusion, left shoulder: Secondary | ICD-10-CM | POA: Insufficient documentation

## 2015-03-23 DIAGNOSIS — R609 Edema, unspecified: Secondary | ICD-10-CM

## 2015-03-23 DIAGNOSIS — M109 Gout, unspecified: Secondary | ICD-10-CM

## 2015-03-23 DIAGNOSIS — M25421 Effusion, right elbow: Secondary | ICD-10-CM | POA: Insufficient documentation

## 2015-03-23 DIAGNOSIS — Z8639 Personal history of other endocrine, nutritional and metabolic disease: Secondary | ICD-10-CM | POA: Insufficient documentation

## 2015-03-23 DIAGNOSIS — M10071 Idiopathic gout, right ankle and foot: Secondary | ICD-10-CM | POA: Insufficient documentation

## 2015-03-23 DIAGNOSIS — M10072 Idiopathic gout, left ankle and foot: Secondary | ICD-10-CM | POA: Insufficient documentation

## 2015-03-23 DIAGNOSIS — M25462 Effusion, left knee: Secondary | ICD-10-CM | POA: Insufficient documentation

## 2015-03-23 DIAGNOSIS — I10 Essential (primary) hypertension: Secondary | ICD-10-CM | POA: Insufficient documentation

## 2015-03-23 DIAGNOSIS — G8929 Other chronic pain: Secondary | ICD-10-CM | POA: Insufficient documentation

## 2015-03-23 DIAGNOSIS — M25422 Effusion, left elbow: Secondary | ICD-10-CM | POA: Insufficient documentation

## 2015-03-23 MED ORDER — ALLOPURINOL 100 MG PO TABS: 100.0000 mg | ORAL_TABLET | Freq: Every day | ORAL | Status: DC

## 2015-03-23 MED ORDER — COLCHICINE 0.6 MG PO TABS: 0.6000 mg | ORAL_TABLET | Freq: Every day | ORAL | Status: DC

## 2015-03-23 MED ORDER — PREDNISONE 10 MG PO TABS: ORAL_TABLET | ORAL | Status: DC

## 2015-03-23 MED ORDER — OXYCODONE-ACETAMINOPHEN 5-325 MG PO TABS: 1.0000 | ORAL_TABLET | ORAL | Status: DC | PRN

## 2015-03-23 MED ORDER — HYDROMORPHONE HCL 1 MG/ML IJ SOLN
1.0000 mg | Freq: Once | INTRAMUSCULAR | Status: AC
  Administered 2015-03-23: 1 mg via INTRAMUSCULAR
  Filled 2015-03-23: qty 1

## 2015-03-23 MED ORDER — COLCHICINE 0.6 MG PO TABS
1.2000 mg | ORAL_TABLET | Freq: Once | ORAL | Status: AC
  Administered 2015-03-23: 1.2 mg via ORAL
  Filled 2015-03-23 (×2): qty 2

## 2015-03-23 MED ORDER — COLCHICINE 0.6 MG PO TABS
0.6000 mg | ORAL_TABLET | Freq: Once | ORAL | Status: AC
  Administered 2015-03-23: 0.6 mg via ORAL
  Filled 2015-03-23: qty 1

## 2015-03-23 NOTE — ED Notes (Signed)
Per PA, give patient another dose of pain medication and try to ambulate patient again.

## 2015-03-23 NOTE — ED Notes (Signed)
Patient states he has gout in "both knees, both ankles and both big toes".   Patient states he couldn't walk comfortably this morning so he called ambulance.   Patient states has had gout for last 5 years.   Patient states has never been on medication for gout.

## 2015-03-23 NOTE — Progress Notes (Signed)
VASCULAR LAB PRELIMINARY  PRELIMINARY  PRELIMINARY  PRELIMINARY  Left lower extremity venous Doppler completed.    Preliminary report:  There is no DVT or SVT noted in the left lower extremity.  Interstitial fluid noted throughout the calf.  Lennon Boutwell, RVT 03/23/2015, 1:23 PM

## 2015-03-23 NOTE — ED Notes (Signed)
Report given to [facility] 

## 2015-03-23 NOTE — ED Notes (Signed)
Patient's wife expressed concern over patient being unable to ambulate from point A to point B.   Patient was reassured that we will try to get pain under control enough that he would be able to ambulate from wheelchair to car and car up 4 steps into his home.   Wife advised if patient cant do that, then we would have to get ambulance to take patient home.   Wife very pleasant and reasonable to work with.   Patient hopeful that the last dose of medication will help.

## 2015-03-23 NOTE — Discharge Instructions (Signed)

## 2015-03-23 NOTE — ED Notes (Signed)
Called [facility] for pickup.  

## 2015-03-23 NOTE — ED Notes (Signed)
Attempted to ambulate pt in the hallway pt states he couldn't walk PA and RN aware

## 2015-03-23 NOTE — ED Notes (Addendum)
Urine output 575cc, amber, clear.

## 2015-03-23 NOTE — ED Provider Notes (Signed)
CSN: [id number]     Arrival date & time 03/23/15  1024 History  This chart was scribed for non-physician practitioner, Burgess Amor, PA-C, working with Raeford Razor, MD, by Ronney Lion, ED Scribe. This patient was seen in room TR09C/TR09C and the patient's care was started at 11:00 AM.    Chief Complaint  Patient presents with  . Gout  . Suture / Staple Removal   Patient is a 49 y.o. male presenting with leg pain. The history is provided by the patient and the spouse. No language interpreter was used.  Leg Pain Location:  Knee, ankle and toe Injury: no   Knee location:  L knee and R knee Ankle location:  L ankle and R ankle Toe location:  L big toe and R big toe Pain details:    Radiates to:  Does not radiate   Severity:  Severe   Onset quality:  Gradual   Duration:  1 day   Timing:  Constant   Progression:  Worsening Chronicity:  Chronic Dislocation: no   Foreign body present:  No foreign bodies Tetanus status:  Unknown Prior injury to area:  No Relieved by:  Nothing Worsened by:  Bearing weight Ineffective treatments:  None tried Associated symptoms: no fever    HPI Comments: Daniel Butler is a 49 y.o. male S/P craniotomy due to intracranial aneurysm on 03/02/15, requiring hospitalization 03/02/15-03/12/15, brought in by ambulance to the Emergency Department complaining of severe, constant pain and swelling in his bilateral knees, ankles, shoulders, and elbows that is chronic but acutely worsened this morning. He states the pain began in his bilateral knees and then moved downwards to his feet and ankles, to the point where he could not ambulate secondary to the pain, although he was prior able to walk with assistance of a cane. Patient's wife states he is scheduled to see his Dr. Conchita Paris for follow-up at 11:45 PM today, but she called Dr. Conchita Paris, who referred patient here to be treated for gout before his appointment. Patient attributes his symptoms to an exacerbation of gout  that has occurred intermittently for the last 4-5 years, although he has not taken medication for this. Patient notes he ate a lot of red meat during the 9 days he was in the hospital, although he typically avoids these. He states his knees are usually red, but they are not today. He also states his legs have been swollen for the last 3 days. Patient states he has been inactive since his aneurysm, although his wife states he occasionally ambulates to the bathroom.  He denies fevers, chills, injury or falls.   Past Medical History  Diagnosis Date  . Hypertension   . High cholesterol   . Gout    Past Surgical History  Procedure Laterality Date  . Wisdom tooth extraction    . Radiology with anesthesia N/A 03/02/2015    Procedure: RADIOLOGY WITH ANESTHESIA;  Surgeon: Lisbeth Renshaw, MD;  Location: [facility] OR;  Service: Radiology;  Laterality: N/A;  . Craniotomy Right 03/02/2015    Procedure: CRANIOTOMY INTRACRANIAL  ANEURYSM FOR CLIPPING;  Surgeon: Lisbeth Renshaw, MD;  Location: [facility] OR[facility];  Service: Neurosurgery;  Laterality: Right;   Family History  Problem Relation Age of Onset  . Adopted: Yes   History  Substance Use Topics  . Smoking status: Current Every Day Smoker -- 1.00 packs/day    Types: Cigarettes  . Smokeless tobacco: Not on file  . Alcohol Use: 21.0 oz/week    35 Cans  of beer, 0 Standard drinks or equivalent per week    Review of Systems  Constitutional: Negative for fever and chills.  Respiratory: Negative for shortness of breath.   Cardiovascular: Negative for chest pain.  Musculoskeletal: Positive for joint swelling and arthralgias. Negative for myalgias.  Skin: Positive for color change (redness around ankles).  Neurological: Negative for weakness and numbness.   Allergies  Review of patient's allergies indicates no known allergies.  Home Medications   Prior to Admission medications   Medication Sig Start Date End Date Taking? Authorizing Provider   allopurinol (ZYLOPRIM) 100 MG tablet Take 1 tablet (100 mg total) by mouth daily. 03/23/15   Burgess Amor, PA-C  colchicine 0.6 MG tablet Take 1 tablet (0.6 mg total) by mouth daily. 03/23/15   Burgess Amor, PA-C  HYDROcodone-acetaminophen (NORCO) 5-325 MG per tablet Take 1-2 tablets by mouth every 4 (four) hours as needed (pain). 03/12/15   Shirlean Kelly, MD  levETIRAcetam (KEPPRA) 500 MG tablet Take 1 tablet (500 mg total) by mouth 2 (two) times daily. 03/12/15   Shirlean Kelly, MD  niMODipine (NIMOTOP) 30 MG capsule Take 2 capsules (60 mg total) by mouth every 4 (four) hours. 03/12/15   Shirlean Kelly, MD  oxyCODONE-acetaminophen (PERCOCET/ROXICET) 5-325 MG per tablet Take 1 tablet by mouth every 4 (four) hours as needed. 03/23/15   Burgess Amor, PA-C  predniSONE (DELTASONE) 10 MG tablet 6, 5, 4, 3, 2 then 1 tablet by mouth daily for 6 days total. 03/23/15   Burgess Amor, PA-C   BP 151/80 mmHg  Pulse 77  Temp(Src) 98.5 F (36.9 C) (Oral)  Resp 20  SpO2 98% Physical Exam  Constitutional: He appears well-developed and well-nourished.  HENT:  Head: Normocephalic and atraumatic.  Healing surgical incision of scalp.  Eyes: Conjunctivae are normal.  Neck: Normal range of motion.  Cardiovascular: Normal rate, regular rhythm, normal heart sounds and intact distal pulses.   Pulses equal bilaterally  Pulmonary/Chest: Effort normal and breath sounds normal. He has no wheezes.  Abdominal: Soft. Bowel sounds are normal. There is no tenderness.  Musculoskeletal: Normal range of motion. He exhibits tenderness.  bilateral ankle and lower leg edema, left visibly greater than right. Erythema at medial ankles, with mild increased warmth. DP pulses are intact. Tender to palpation bilateral knees, without edema, erythema, or visible signs of gouty flare. He has FROM of the knees, without increased pain. Ankles are painful with flexion. No palpable cords in calves.   Neurological: He is alert. He has normal strength. He  displays normal reflexes. No sensory deficit.  Skin: Skin is warm and dry.  Psychiatric: He has a normal mood and affect.  Nursing note and vitals reviewed.   ED Course  Procedures (including critical care time)  DIAGNOSTIC STUDIES: Oxygen Saturation is 100% on RA, normal by my interpretation.    COORDINATION OF CARE: 11:12 AM - Discussed treatment plan with pt at bedside which includes consult with attending physician Dr. Juleen China, and pt agreed to plan.  11:22 AM - Dr. Juleen China in room to evaluate patient. We will order ultrasound to r/o dvt, and administer colchicine and Dilaudid.   2:33 PM - U/S negative for DVT. Discussed with pt's wife.  Meds ordered this encounter  Medications  . HYDROmorphone (DILAUDID) injection 1 mg    Sig:   . colchicine tablet 1.2 mg    Sig:   . oxyCODONE-acetaminophen (PERCOCET/ROXICET) 5-325 MG per tablet    Sig: Take 1 tablet by mouth every 4 (four)  hours as needed.    Dispense:  30 tablet    Refill:  0    Order Specific Question:  Supervising Provider    Answer:  MILLER, BRIAN [[id number]]  . colchicine tablet 0.6 mg    Sig:   . HYDROmorphone (DILAUDID) injection 1 mg    Sig:   . allopurinol (ZYLOPRIM) 100 MG tablet    Sig: Take 1 tablet (100 mg total) by mouth daily.    Dispense:  30 tablet    Refill:  0    Order Specific Question:  Supervising Provider    Answer:  MILLER, BRIAN [[id number]]  . colchicine 0.6 MG tablet    Sig: Take 1 tablet (0.6 mg total) by mouth daily.    Dispense:  20 tablet    Refill:  0    Order Specific Question:  Supervising Provider    Answer:  MILLER, BRIAN [[id number]]  . predniSONE (DELTASONE) 10 MG tablet    Sig: 6, 5, 4, 3, 2 then 1 tablet by mouth daily for 6 days total.    Dispense:  21 tablet    Refill:  0    Order Specific Question:  Supervising Provider    Answer:  Eber Hong [[id number]]     MDM   Final diagnoses:  Swelling  Acute gout of foot, unspecified cause, unspecified laterality    Medications   HYDROmorphone (DILAUDID) injection 1 mg (1 mg Intramuscular Given 03/23/15 1140)  colchicine tablet 1.2 mg (1.2 mg Oral Given 03/23/15 1140)  colchicine tablet 0.6 mg (0.6 mg Oral Given 03/23/15 1449)  HYDROmorphone (DILAUDID) injection 1 mg (1 mg Intramuscular Given 03/23/15 1449)   Patients labs and/or radiological studies were reviewed and considered during the medical decision making and disposition process.  Results were also discussed with patient. No dvt per [place].  Pt was given dilaudid along with colchicine 1.2 mg followed by the second 0.6 mg dose.  He was unable to ambulate after the first dose of dilaudid.  Second dose given, attempt to ambulate after- only able to bear 1-2 steps secondary to pain.  He does have a cane, plus a walker at home and is desirous of going home to wait improvement with meds.  Wife states she is unable to get him into their home due to steps, but once there, will be ok.  Requests [facility] transport which is reasonable. Medical necessity form completed.  He was prescribed a prednisone taper,, percocet for pain relief. He was also desirous of allopurinol  And colchicine for maintenance which he has been on in the past. These were prescribed. Advised he should not take additional colchicine until tomorrow am as he has had maximum dosing today.  Advised prn f/u with pcp.  He will reschedule his appt with Dr. Conchita Paris.  Secretary here attempted twice to contact his office to advise of pt's presence in the ed, unable to contact.   Burgess Amor, PA-C 03/25/15 1845  I personally performed the services described in this documentation, which was scribed in my presence. The recorded information has been reviewed and is accurate.   Burgess Amor, PA-C 03/25/15 1851  Raeford Razor, MD 03/30/15 [phone]

## 2015-04-18 ENCOUNTER — Ambulatory Visit (INDEPENDENT_AMBULATORY_CARE_PROVIDER_SITE_OTHER): Payer: Self-pay | Admitting: Family

## 2015-04-18 ENCOUNTER — Encounter (INDEPENDENT_AMBULATORY_CARE_PROVIDER_SITE_OTHER): Payer: Self-pay

## 2015-04-18 ENCOUNTER — Encounter: Payer: Self-pay | Admitting: Family

## 2015-04-18 VITALS — BP 180/122 | HR 102 | Temp 98.1°F | Resp 18 | Ht 73.0 in | Wt 240.0 lb

## 2015-04-18 DIAGNOSIS — I10 Essential (primary) hypertension: Secondary | ICD-10-CM

## 2015-04-18 DIAGNOSIS — R51 Headache: Secondary | ICD-10-CM

## 2015-04-18 DIAGNOSIS — R519 Headache, unspecified: Secondary | ICD-10-CM | POA: Insufficient documentation

## 2015-04-18 DIAGNOSIS — M109 Gout, unspecified: Secondary | ICD-10-CM

## 2015-04-18 MED ORDER — COLCHICINE 0.6 MG PO TABS: 0.6000 mg | ORAL_TABLET | Freq: Every day | ORAL | Status: DC

## 2015-04-18 MED ORDER — ALLOPURINOL 100 MG PO TABS: 100.0000 mg | ORAL_TABLET | Freq: Every day | ORAL | Status: DC

## 2015-04-18 MED ORDER — AMLODIPINE BESYLATE 10 MG PO TABS: 10.0000 mg | ORAL_TABLET | Freq: Every day | ORAL | Status: DC

## 2015-04-18 MED ORDER — ACETAMINOPHEN-CODEINE #3 300-30 MG PO TABS: 1.0000 | ORAL_TABLET | ORAL | Status: DC | PRN

## 2015-04-18 MED ORDER — ALLOPURINOL 100 MG PO TABS
100.0000 mg | ORAL_TABLET | Freq: Every day | ORAL | Status: DC
Start: 2015-04-18 — End: 2015-04-18

## 2015-04-18 NOTE — Assessment & Plan Note (Signed)
Stable with current regimen of colchicine and allopurinol. Denies adverse side effects. Continue current dosage of colchicine and allopurinol.

## 2015-04-18 NOTE — Patient Instructions (Signed)
Thank you for choosing  HealthCare.  Summary/Instructions:  Your prescription(s) have been submitted to your pharmacy or been printed and provided for you. Please take as directed and contact our office if you believe you are having problem(s) with the medication(s) or have any questions.  If your symptoms worsen or fail to improve, please contact our office for further instruction, or in case of emergency go directly to the emergency room at the closest medical facility.   Hypertension Hypertension, commonly called high blood pressure, is when the force of blood pumping through your arteries is too strong. Your arteries are the blood vessels that carry blood from your heart throughout your body. A blood pressure reading consists of a higher number over a lower number, such as 110/72. The higher number (systolic) is the pressure inside your arteries when your heart pumps. The lower number (diastolic) is the pressure inside your arteries when your heart relaxes. Ideally you want your blood pressure below 120/80. Hypertension forces your heart to work harder to pump blood. Your arteries may become narrow or stiff. Having hypertension puts you at risk for heart disease, stroke, and other problems.  RISK FACTORS Some risk factors for high blood pressure are controllable. Others are not.  Risk factors you cannot control include:   Race. You may be at higher risk if you are African American.  Age. Risk increases with age.  Gender. Men are at higher risk than women before age 45 years. After age 65, women are at higher risk than men. Risk factors you can control include:  Not getting enough exercise or physical activity.  Being overweight.  Getting too much fat, sugar, calories, or salt in your diet.  Drinking too much alcohol. SIGNS AND SYMPTOMS Hypertension does not usually cause signs or symptoms. Extremely high blood pressure (hypertensive crisis) may cause headache, anxiety,  shortness of breath, and nosebleed. DIAGNOSIS  To check if you have hypertension, your health care provider will measure your blood pressure while you are seated, with your arm held at the level of your heart. It should be measured at least twice using the same arm. Certain conditions can cause a difference in blood pressure between your right and left arms. A blood pressure reading that is higher than normal on one occasion does not mean that you need treatment. If one blood pressure reading is high, ask your health care provider about having it checked again. TREATMENT  Treating high blood pressure includes making lifestyle changes and possibly taking medicine. Living a healthy lifestyle can help lower high blood pressure. You may need to change some of your habits. Lifestyle changes may include:  Following the DASH diet. This diet is high in fruits, vegetables, and whole grains. It is low in salt, red meat, and added sugars.  Getting at least 2 hours of brisk physical activity every week.  Losing weight if necessary.  Not smoking.  Limiting alcoholic beverages.  Learning ways to reduce stress. If lifestyle changes are not enough to get your blood pressure under control, your health care provider may prescribe medicine. You may need to take more than one. Work closely with your health care provider to understand the risks and benefits. HOME CARE INSTRUCTIONS  Have your blood pressure rechecked as directed by your health care provider.   Take medicines only as directed by your health care provider. Follow the directions carefully. Blood pressure medicines must be taken as prescribed. The medicine does not work as well when you skip doses.   Skipping doses also puts you at risk for problems.   Do not smoke.   Monitor your blood pressure at home as directed by your health care provider. SEEK MEDICAL CARE IF:   You think you are having a reaction to medicines taken.  You have  recurrent headaches or feel dizzy.  You have swelling in your ankles.  You have trouble with your vision. SEEK IMMEDIATE MEDICAL CARE IF:  You develop a severe headache or confusion.  You have unusual weakness, numbness, or feel faint.  You have severe chest or abdominal pain.  You vomit repeatedly.  You have trouble breathing. MAKE SURE YOU:   Understand these instructions.  Will watch your condition.  Will get help right away if you are not doing well or get worse. Document Released: 08/26/2005 Document Revised: 01/10/2014 Document Reviewed: 06/18/2013 Exit[employer] Patient Information 2015 [employer]. This information is not intended to replace advice given to you by your health care provider. Make sure you discuss any questions you have with your health care provider.   

## 2015-04-18 NOTE — Assessment & Plan Note (Signed)
Hypertension remains uncontrolled and not currently on medication. Start amlodipine. Monitor blood pressure at home. Follow-up in one month.

## 2015-04-18 NOTE — Assessment & Plan Note (Signed)
Most likely related to recent subarachnoid hemorrhage and aneurysm. Reports constipation with Norco. Discontinue Norco. Start Tylenol 3. Discussed importance of bowel regimen. Patient will like to start with over-the-counter medications before attempting Movantik or Amitiza. Follow-up if symptoms worsen or do not improve with medications. Seek emergency care for worse headache of life.

## 2015-04-18 NOTE — Progress Notes (Signed)
Pre visit review using our clinic review tool, if applicable. No additional management support is needed unless otherwise documented below in the visit note. 

## 2015-04-18 NOTE — Progress Notes (Signed)
Subjective:    Patient ID: Daniel Butler, male    DOB: 1966/04/24, 49 y.o.   MRN: [id number]  Chief Complaint  Patient presents with  . Establish Care    wants to see about getting on something for BP, still gets headaches behind his right eye and at the base of his skull, would like to try something thats not an opiate     HPI:  Daniel Butler is a 49 y.o. male with a PMH of hypertension, gout and subarachnoid hemorrhage who presents today for an office visit to establish care.    1.) Hypertension - Associated symptom of hypertension has been going on for several years. Has been previously maintained on medication. Not currently taking any medication. Does not currently monitor blood pressure at home.  BP Readings from Last 3 Encounters:  04/18/15 180/122  03/23/15 151/80  03/12/15 158/65    2.) Headaches - Previously had an anuersym clipping and since has experienced the associated symptom of occasional headache located in base of his skull and behind his right eye. Prescribed Norco to help with his headaches, but has experienced severe constipation.  3.) Gout - Stable with current regimen of allopurinol and colchicine. Takes medications as prescribed and denies adverse side effects.  No Known Allergies   Outpatient Prescriptions Prior to Visit  Medication Sig Dispense Refill  . levETIRAcetam (KEPPRA) 500 MG tablet Take 1 tablet (500 mg total) by mouth 2 (two) times daily. 60 tablet 3  . allopurinol (ZYLOPRIM) 100 MG tablet Take 1 tablet (100 mg total) by mouth daily. 30 tablet 0  . colchicine 0.6 MG tablet Take 1 tablet (0.6 mg total) by mouth daily. 20 tablet 0  . HYDROcodone-acetaminophen (NORCO) 5-325 MG per tablet Take 1-2 tablets by mouth every 4 (four) hours as needed (pain). 50 tablet 0  . niMODipine (NIMOTOP) 30 MG capsule Take 2 capsules (60 mg total) by mouth every 4 (four) hours. 24 capsule 0  . oxyCODONE-acetaminophen (PERCOCET/ROXICET) 5-325 MG per  tablet Take 1 tablet by mouth every 4 (four) hours as needed. 30 tablet 0  . predniSONE (DELTASONE) 10 MG tablet 6, 5, 4, 3, 2 then 1 tablet by mouth daily for 6 days total. 21 tablet 0   No facility-administered medications prior to visit.     Past Medical History  Diagnosis Date  . Hypertension   . High cholesterol   . Gout   . Chicken pox   . Depression   . Brain aneurysm      Past Surgical History  Procedure Laterality Date  . Wisdom tooth extraction    . Radiology with anesthesia N/A 03/02/2015    Procedure: RADIOLOGY WITH ANESTHESIA;  Surgeon: Lisbeth Renshaw, MD;  Location: [facility] OR;  Service: Radiology;  Laterality: N/A;  . Craniotomy Right 03/02/2015    Procedure: CRANIOTOMY INTRACRANIAL  ANEURYSM FOR CLIPPING;  Surgeon: Lisbeth Renshaw, MD;  Location: [facility];  Service: Neurosurgery;  Laterality: Right;     Family History  Problem Relation Age of Onset  . Adopted: Yes     History   Social History  . Marital Status: Married    Spouse Name: N/A  . Number of Children: 0  . Years of Education: 12   Occupational History  . Store Production designer, theatre/television/film    Social History Main Topics  . Smoking status: Current Every Day Smoker -- 1.00 packs/day for 25 years    Types: Cigarettes  . Smokeless tobacco: Never Used  . Alcohol  Use: 21.0 oz/week    35 Cans of beer, 0 Standard drinks or equivalent per week  . Drug Use: No  . Sexual Activity: Not on file   Other Topics Concern  . Not on file   Social History Narrative   Denies religious beliefs effecting health care.      Review of Systems  Respiratory: Negative for chest tightness and shortness of breath.   Cardiovascular: Negative for chest pain, palpitations and leg swelling.  Neurological: Positive for headaches. Negative for tremors, seizures and weakness.      Objective:    BP 180/122 mmHg  Pulse 102  Temp(Src) 98.1 F (36.7 C) (Oral)  Resp 18  Ht  (1.854 m)  Wt 240 lb (108.863 kg)  BMI 31.67  kg/m2  SpO2 98% Nursing note and vital signs reviewed.  Physical Exam  Constitutional: He is oriented to person, place, and time. He appears well-developed and well-nourished. No distress.  Cardiovascular: Normal rate, regular rhythm, normal heart sounds and intact distal pulses.   Pulmonary/Chest: Effort normal and breath sounds normal.  Neurological: He is alert and oriented to person, place, and time. He has normal reflexes. No cranial nerve deficit. He exhibits normal muscle tone. Coordination normal.  Skin: Skin is warm and dry.  Psychiatric: He has a normal mood and affect. His behavior is normal. Judgment and thought content normal.       Assessment & Plan:   Problem List Items Addressed This Visit      Cardiovascular and Mediastinum   Malignant hypertension - Primary    Hypertension remains uncontrolled and not currently on medication. Start amlodipine. Monitor blood pressure at home. Follow-up in one month.      Relevant Medications   amLODipine (NORVASC) 10 MG tablet     Other   Generalized headaches    Most likely related to recent subarachnoid hemorrhage and aneurysm. Reports constipation with Norco. Discontinue Norco. Start Tylenol 3. Discussed importance of bowel regimen. Patient will like to start with over-the-counter medications before attempting Movantik or Amitiza. Follow-up if symptoms worsen or do not improve with medications. Seek emergency care for worse headache of life.      Relevant Medications   acetaminophen-codeine (TYLENOL #3) 300-30 MG per tablet   amLODipine (NORVASC) 10 MG tablet   allopurinol (ZYLOPRIM) 100 MG tablet   colchicine 0.6 MG tablet   Gout    Stable with current regimen of colchicine and allopurinol. Denies adverse side effects. Continue current dosage of colchicine and allopurinol.      Relevant Medications   allopurinol (ZYLOPRIM) 100 MG tablet   colchicine 0.6 MG tablet

## 2015-05-19 ENCOUNTER — Encounter: Payer: Self-pay | Admitting: Family

## 2015-05-19 ENCOUNTER — Ambulatory Visit (INDEPENDENT_AMBULATORY_CARE_PROVIDER_SITE_OTHER): Payer: Self-pay | Admitting: Family

## 2015-05-19 ENCOUNTER — Other Ambulatory Visit: Payer: Self-pay | Admitting: Family

## 2015-05-19 VITALS — BP 208/90 | HR 104 | Temp 98.4°F | Resp 18 | Ht 73.0 in | Wt 242.0 lb

## 2015-05-19 DIAGNOSIS — G479 Sleep disorder, unspecified: Secondary | ICD-10-CM

## 2015-05-19 DIAGNOSIS — F411 Generalized anxiety disorder: Secondary | ICD-10-CM | POA: Insufficient documentation

## 2015-05-19 DIAGNOSIS — I10 Essential (primary) hypertension: Secondary | ICD-10-CM

## 2015-05-19 MED ORDER — LOSARTAN POTASSIUM-HCTZ 100-12.5 MG PO TABS: 1.0000 | ORAL_TABLET | Freq: Every day | ORAL | Status: DC

## 2015-05-19 MED ORDER — ALPRAZOLAM 0.25 MG PO TABS: 0.2500 mg | ORAL_TABLET | Freq: Two times a day (BID) | ORAL | Status: DC | PRN

## 2015-05-19 MED ORDER — AMLODIPINE BESYLATE 10 MG PO TABS: 10.0000 mg | ORAL_TABLET | Freq: Every day | ORAL | Status: DC

## 2015-05-19 NOTE — Progress Notes (Signed)
Pre visit review using our clinic review tool, if applicable. No additional management support is needed unless otherwise documented below in the visit note. 

## 2015-05-19 NOTE — Assessment & Plan Note (Signed)
Symptoms of anxiety state most likely related to being out of work and medical diagnosis. Discussed options for relieving anxiety with risks and benefits. Patient would like to start Xanax as needed for anxiety. This is most likely temporary as once patient is cleared for work his anxiety levels will most likely decreased. Follow-up if symptoms worsen or fail to improve.

## 2015-05-19 NOTE — Assessment & Plan Note (Signed)
Describes sleep disturbance with difficulty falling asleep and staying asleep most likely related to anxiety and most recent medical diagnosis of brain aneurysm. Treat conservatively with over-the-counter sleep aids as needed. Discussed importance of adequate sleep hygiene. Follow up if symptoms worsen or fail to improve with these interventions.

## 2015-05-19 NOTE — Progress Notes (Signed)
Subjective:    Patient ID: Daniel Butler, male    DOB: 11/24/1965, 49 y.o.   MRN: [id number]  Chief Complaint  Patient presents with  . Follow-up    wants to talk about colchicine, and also about tylenol 3, gout has gotten better, increase of bp meds?    HPI:  Daniel Butler is a 49 y.o. male with a PMH of hypertension, subarachnoid hemorrhage, gout, and aneurysm clipping who presents today for a follow-up office visit.   1.) Hypertension - Currently maintained on amlodipine. Takes the medication as prescribed and denies adverse side effects. Has not taken his medication today and has also drank a large amount of caffeine. Blood pressures at home averaging between 160-170.   BP Readings from Last 3 Encounters:  05/19/15 208/90  04/18/15 180/122  03/23/15 151/80    2.) Insomnia -  Associated symptom of difficulty falling asleep and staying staying asleep has been going on for about 1-2 weeks. Describes that when he tries to sleep his mind becomes very active. Denies any modifying treatments to help with his sleeping. Currently averaging about 4 hours of sleep per day.   3.) Depression - Associated symptom of anxiety secondary to not being at work and coping with his medical conditions. Indicates that he has anxiety secondary to being home alone.   No Known Allergies   Current Outpatient Prescriptions on File Prior to Visit  Medication Sig Dispense Refill  . acetaminophen-codeine (TYLENOL #3) 300-30 MG per tablet Take 1 tablet by mouth every 4 (four) hours as needed for moderate pain. 30 tablet 0  . allopurinol (ZYLOPRIM) 100 MG tablet Take 1 tablet (100 mg total) by mouth daily. 30 tablet 0  . levETIRAcetam (KEPPRA) 500 MG tablet Take 1 tablet (500 mg total) by mouth 2 (two) times daily. 60 tablet 3   No current facility-administered medications on file prior to visit.   Past Medical History  Diagnosis Date  . Hypertension   . High cholesterol   . Gout   .  Chicken pox   . Depression   . Brain aneurysm     Review of Systems  Respiratory: Negative for chest tightness and shortness of breath.   Cardiovascular: Negative for chest pain, palpitations and leg swelling.  Musculoskeletal: Positive for neck pain.      Objective:    BP 208/90 mmHg  Pulse 104  Temp(Src) 98.4 F (36.9 C) (Oral)  Resp 18  Ht 6\' 1"  (1.854 m)  Wt 242 lb (109.77 kg)  BMI 31.93 kg/m2  SpO2 98% Nursing note and vital signs reviewed.  Physical Exam  Constitutional: He is oriented to person, place, and time. He appears well-developed and well-nourished. No distress.  Cardiovascular: Normal rate, regular rhythm, normal heart sounds and intact distal pulses.   Pulmonary/Chest: Effort normal and breath sounds normal.  Neurological: He is alert and oriented to person, place, and time.  Skin: Skin is warm and dry.  Psychiatric: His behavior is normal. Judgment and thought content normal. His mood appears anxious.       Assessment & Plan:   Problem List Items Addressed This Visit      Cardiovascular and Mediastinum   Malignant hypertension - Primary    Blood pressure remains elevated above goal of 140/90 with current regimen. Start losartan-hydrochlorothiazide. Continue current dosage of amlodipine. Monitor blood pressure at home. Follow-up in 2 weeks for nurse visit to determine blood pressure control.      Relevant Medications  amLODipine (NORVASC) 10 MG tablet   losartan-hydrochlorothiazide (HYZAAR) 100-12.5 MG per tablet     Other   Sleep disturbance    Describes sleep disturbance with difficulty falling asleep and staying asleep most likely related to anxiety and most recent medical diagnosis of brain aneurysm. Treat conservatively with over-the-counter sleep aids as needed. Discussed importance of adequate sleep hygiene. Follow up if symptoms worsen or fail to improve with these interventions.      Anxiety state    Symptoms of anxiety state most likely  related to being out of work and medical diagnosis. Discussed options for relieving anxiety with risks and benefits. Patient would like to start Xanax as needed for anxiety. This is most likely temporary as once patient is cleared for work his anxiety levels will most likely decreased. Follow-up if symptoms worsen or fail to improve.      Relevant Medications   ALPRAZolam (XANAX) 0.25 MG tablet

## 2015-05-19 NOTE — Patient Instructions (Addendum)
Thank you for choosing [facility].  Summary/Instructions:  For sleep please try benedryl, unisom or melatonin.   Take tylenol 3 and colchicine as needed for pain and gout.  Recommendations for improving sleep:   Avoid having pets sleep in the bedroom  Avoid caffeine consumption after 4pm  Keep bedroom cool and conducive to sleep  Avoid nicotine use, especially in the evening  Avoid exercise within 2-3 hours before bedtime  Stimulus Control:   Go to bed only when sleepy  Use the bedroom for sleep and sex only  Go to another room if you are unable to fall asleep within 15 to 20 minutes  Read or engage in other quiet activities and return to bed only when sleepy.  If your symptoms worsen or fail to improve, please contact our office for further instruction, or in case of emergency go directly to the emergency room at the closest medical facility.    Hydrochlorothiazide, HCTZ; Losartan tablets What is this medicine? LOSARTAN; HYDROCHLOROTHIAZIDE (loe SAR tan; hye droe klor oh THYE a zide) is a combination of a drug that relaxes blood vessels and a diuretic. It is used to treat high blood pressure. This medicine may also reduce the risk of stroke in certain patients. This medicine may be used for other purposes; ask your health care provider or pharmacist if you have questions. COMMON BRAND NAME(S): Hyzaar What should I tell my health care provider before I take this medicine? They need to know if you have any of these conditions: -decreased urine -kidney disease -liver disease -if you are on a special diet, like a low-salt diet -immune system problems, like lupus -an unusual or allergic reaction to losartan, hydrochlorothiazide, sulfa drugs, other medicines, foods, dyes, or preservatives -pregnant or trying to get pregnant -breast-feeding How should I use this medicine? Take this medicine by mouth with a glass of water. Follow the directions on the prescription  label. You can take it with or without food. If it upsets your stomach, take it with food. Take your medicine at regular intervals. Do not take it more often than directed. Do not stop taking except on your doctor's advice. Talk to your pediatrician regarding the use of this medicine in children. Special care may be needed. Overdosage: If you think you have taken too much of this medicine contact a poison control center or emergency room at once. NOTE: This medicine is only for you. Do not share this medicine with others. What if I miss a dose? If you miss a dose, take it as soon as you can. If it is almost time for your next dose, take only that dose. Do not take double or extra doses. What may interact with this medicine? -barbiturates, like phenobarbital -blood pressure medicines -celecoxib -cimetidine -corticosteroids -diabetic medicines -diuretics, especially triamterene, spironolactone or amiloride -fluconazole -lithium -NSAIDs, medicines for pain and inflammation, like ibuprofen or naproxen -potassium salts or potassium supplements -prescription pain medicines -rifampin -skeletal muscle relaxants like tubocurarine -some cholesterol-lowering medicines like cholestyramine or colestipol This list may not describe all possible interactions. Give your health care provider a list of all the medicines, herbs, non-prescription drugs, or dietary supplements you use. Also tell them if you smoke, drink alcohol, or use illegal drugs. Some items may interact with your medicine. What should I watch for while using this medicine? Check your blood pressure regularly while you are taking this medicine. Ask your doctor or health care professional what your blood pressure should be and when you should  contact him or her. When you check your blood pressure, write down the measurements to show your doctor or health care professional. If you are taking this medicine for a long time, you must visit your  health care professional for regular checks on your progress. Make sure you schedule appointments on a regular basis. You must not get dehydrated. Ask your doctor or health care professional how much fluid you need to drink a day. Check with him or her if you get an attack of severe diarrhea, nausea and vomiting, or if you sweat a lot. The loss of too much body fluid can make it dangerous for you to take this medicine. Women should inform their doctor if they wish to become pregnant or think they might be pregnant. There is a potential for serious side effects to an unborn child, particularly in the second or third trimester. Talk to your health care professional or pharmacist for more information. You may get drowsy or dizzy. Do not drive, use machinery, or do anything that needs mental alertness until you know how this drug affects you. Do not stand or sit up quickly, especially if you are an older patient. This reduces the risk of dizzy or fainting spells. Alcohol can make you more drowsy and dizzy. Avoid alcoholic drinks. This medicine may affect your blood sugar level. If you have diabetes, check with your doctor or health care professional before changing the dose of your diabetic medicine. Avoid salt substitutes unless you are told otherwise by your doctor or health care professional. Do not treat yourself for coughs, colds, or pain while you are taking this medicine without asking your doctor or health care professional for advice. Some ingredients may increase your blood pressure. What side effects may I notice from receiving this medicine? Side effects that you should report to your doctor or health care professional as soon as possible: -allergic reactions like skin rash, itching or hives, swelling of the face, lips, or tongue -breathing problems -changes in vision -dark urine -eye pain -fast or irregular heart beat, palpitations, or chest pain -feeling faint or lightheaded -muscle  cramps -persistent dry cough -redness, blistering, peeling or loosening of the skin, including inside the mouth -stomach pain -trouble passing urine or change in the amount of urine -unusual bleeding or bruising -worsened gout pain -yellowing of the eyes or skin Side effects that usually do not require medical attention (report to your doctor or health care professional if they continue or are bothersome): -change in sex drive or performance -headache This list may not describe all possible side effects. Call your doctor for medical advice about side effects. You may report side effects to FDA at 1-800-FDA-1088. Where should I keep my medicine? Keep out of the reach of children. Store at room temperature between 15 and 30 degrees C (59 and 86 degrees F). Protect from light. Keep container tightly closed. Throw away any unused medicine after the expiration date. NOTE: This sheet is a summary. It may not cover all possible information. If you have questions about this medicine, talk to your doctor, pharmacist, or health care provider.  2015, [employer]. (2010-05-16 13:57:32)

## 2015-05-19 NOTE — Assessment & Plan Note (Signed)
Blood pressure remains elevated above goal of 140/90 with current regimen. Start losartan-hydrochlorothiazide. Continue current dosage of amlodipine. Monitor blood pressure at home. Follow-up in 2 weeks for nurse visit to determine blood pressure control.

## 2015-05-26 ENCOUNTER — Telehealth: Payer: Self-pay | Admitting: Family

## 2015-05-26 NOTE — Telephone Encounter (Signed)
BP last week  Sat at 4pm-178/85 Sun at 2:30 170/85 Mon at 3pm-144/79 tues at 7pm-139/89 Wed at 2pm-148/89 Thus at 3:30-136/76 fri at 3:45-126/74

## 2015-05-26 NOTE — Telephone Encounter (Signed)
Please continue to have him take his medication as prescribed with no changes and follow up in about 1 month for an office visit.

## 2015-05-29 NOTE — Telephone Encounter (Signed)
Pt aware of response below.  

## 2015-06-16 ENCOUNTER — Telehealth: Payer: Self-pay | Admitting: *Deleted

## 2015-06-16 DIAGNOSIS — I10 Essential (primary) hypertension: Secondary | ICD-10-CM

## 2015-06-16 MED ORDER — AMLODIPINE BESYLATE 10 MG PO TABS: 10.0000 mg | ORAL_TABLET | Freq: Every day | ORAL | Status: DC

## 2015-06-16 MED ORDER — ALLOPURINOL 100 MG PO TABS: 100.0000 mg | ORAL_TABLET | Freq: Every day | ORAL | Status: DC

## 2015-06-16 NOTE — Telephone Encounter (Signed)
Receive call pt is needing refill on his amlodipine & allopurinol. Verified pharmacy inform will send to [facility].../.lmb

## 2015-12-04 ENCOUNTER — Ambulatory Visit (INDEPENDENT_AMBULATORY_CARE_PROVIDER_SITE_OTHER): Payer: Self-pay | Admitting: Family

## 2015-12-04 ENCOUNTER — Telehealth: Payer: Self-pay | Admitting: Family

## 2015-12-04 ENCOUNTER — Encounter: Payer: Self-pay | Admitting: Family

## 2015-12-04 ENCOUNTER — Other Ambulatory Visit (INDEPENDENT_AMBULATORY_CARE_PROVIDER_SITE_OTHER): Payer: Self-pay

## 2015-12-04 VITALS — BP 154/98 | HR 98 | Temp 98.1°F | Resp 16 | Ht 73.0 in | Wt 240.0 lb

## 2015-12-04 DIAGNOSIS — I10 Essential (primary) hypertension: Secondary | ICD-10-CM

## 2015-12-04 DIAGNOSIS — Z5181 Encounter for therapeutic drug level monitoring: Secondary | ICD-10-CM

## 2015-12-04 DIAGNOSIS — F411 Generalized anxiety disorder: Secondary | ICD-10-CM

## 2015-12-04 LAB — BASIC METABOLIC PANEL
BUN: 13 mg/dL (ref 6–23)
CALCIUM: 10 mg/dL (ref 8.4–10.5)
CO2: 29 meq/L (ref 19–32)
Chloride: 100 mEq/L (ref 96–112)
Creatinine, Ser: 0.98 mg/dL (ref 0.40–1.50)
GFR: 85.84 mL/min (ref >60.00)
GLUCOSE: 102 mg/dL — AB (ref 70–99)
POTASSIUM: 3.9 meq/L (ref 3.5–5.1)
Sodium: 137 mEq/L (ref 135–145)

## 2015-12-04 MED ORDER — LOSARTAN POTASSIUM-HCTZ 100-25 MG PO TABS: 1.0000 | ORAL_TABLET | Freq: Every day | ORAL | Status: DC

## 2015-12-04 MED ORDER — ALPRAZOLAM 0.25 MG PO TABS: 0.2500 mg | ORAL_TABLET | Freq: Two times a day (BID) | ORAL | Status: DC | PRN

## 2015-12-04 NOTE — Telephone Encounter (Signed)
Please inform patient that his blood work shows that his potassium levels look good and he can continue to take the salt supplement as needed.

## 2015-12-04 NOTE — Patient Instructions (Addendum)
Thank you for choosing [facility].  Summary/Instructions:  Please continue to take your medications.  Your Hyzaar (losartan-hydrochlorothiazide) has been increased.  Your prescription(s) have been submitted to your pharmacy or been printed and provided for you. Please take as directed and contact our office if you believe you are having problem(s) with the medication(s) or have any questions.  Please stop by the lab on the basement level of the building for your blood work. Your results will be released to MyChart (or called to you) after review, usually within 72 hours after test completion. If any changes need to be made, you will be notified at that same time.  If your symptoms worsen or fail to improve, please contact our office for further instruction, or in case of emergency go directly to the emergency room at the closest medical facility.

## 2015-12-04 NOTE — Progress Notes (Signed)
Pre visit review using our clinic review tool, if applicable. No additional management support is needed unless otherwise documented below in the visit note. 

## 2015-12-04 NOTE — Assessment & Plan Note (Signed)
Anxiety remains labile and uncontrolled without any current medication intake. He did have the alprazolam he reported improvement in his symptoms. Discussed importance of stress and stress management. Continue current dosage of alprazolam. If symptoms occur on daily basis consider adding a a daily medication. Follow-up in one month or sooner if needed.

## 2015-12-04 NOTE — Assessment & Plan Note (Addendum)
Hypertension remains uncontrolled with current regimen and above goal of 140/90 and exacerbated by anxiety. Obtain BMET to check electrolytes. Continue current dosage of amlodipine. Increase losartan-hydrochlorothiazide. Encouraged to take amlodipine at night and losartan-hydrochlorothiazide in the morning. Continue to monitor blood pressure at home. Follow-up in one month or sooner if symptoms worsen or do not improve.

## 2015-12-04 NOTE — Progress Notes (Signed)
Subjective:    Patient ID: Daniel Butler, male    DOB: 05-06-1966, 50 y.o.   MRN: [id number]  Chief Complaint  Patient presents with  . Hypertension    has been having sxs of feeling overwhelmed, sweating and breathing heavy, also has been having high BP in the mornings when waking up before medication    HPI:  Daniel Butler is a 50 y.o. male who  has a past medical history of Hypertension; High cholesterol; Gout; Chicken pox; Depression; and Brain aneurysm. and presents today for a follow up office visit.  Experiencing the associated symptoms of feeling overwhelmed, sweating and breathing heavy has been going on for approximately . Notes that his blood pressure has been elevated in the mornings before taking his medication. Hypertension is currently maintained on losartan-hydrochlorothiazide and amlodipine. Reports taking the medication as prescribed. He was previously seen in the office for hypertension with instructions to follow up in 1 month which did not occur.   BP Readings from Last 3 Encounters:  12/04/15 154/98  05/19/15 208/90  04/18/15 180/122    No Known Allergies   Current Outpatient Prescriptions on File Prior to Visit  Medication Sig Dispense Refill  . allopurinol (ZYLOPRIM) 100 MG tablet Take 1 tablet (100 mg total) by mouth daily. 90 tablet 1  . amLODipine (NORVASC) 10 MG tablet Take 1 tablet (10 mg total) by mouth daily. 90 tablet 3   No current facility-administered medications on file prior to visit.     Past Surgical History  Procedure Laterality Date  . Wisdom tooth extraction    . Radiology with anesthesia N/A 03/02/2015    Procedure: RADIOLOGY WITH ANESTHESIA;  Surgeon: Lisbeth Renshaw, MD;  Location: [facility] OR;  Service: Radiology;  Laterality: N/A;  . Craniotomy Right 03/02/2015    Procedure: CRANIOTOMY INTRACRANIAL  ANEURYSM FOR CLIPPING;  Surgeon: Lisbeth Renshaw, MD;  Location: [facility];  Service: Neurosurgery;  Laterality: Right;      Past Medical History  Diagnosis Date  . Hypertension   . High cholesterol   . Gout   . Chicken pox   . Depression   . Brain aneurysm     Review of Systems  Constitutional: Positive for diaphoresis. Negative for fever and chills.  Respiratory: Negative for chest tightness, shortness of breath and wheezing.   Cardiovascular: Negative for chest pain, palpitations and leg swelling.  Neurological: Negative for headaches.  Psychiatric/Behavioral: The patient is nervous/anxious.       Objective:    BP 154/98 mmHg  Pulse 98  Temp(Src) 98.1 F (36.7 C) (Oral)  Resp 16  Ht  (1.854 m)  Wt 240 lb (108.863 kg)  BMI 31.67 kg/m2  SpO2 97% Nursing note and vital signs reviewed.  Physical Exam  Constitutional: He is oriented to person, place, and time. He appears well-developed and well-nourished. No distress.  Cardiovascular: Normal rate, regular rhythm, normal heart sounds and intact distal pulses.   Pulmonary/Chest: Effort normal and breath sounds normal.  Neurological: He is alert and oriented to person, place, and time.  Skin: Skin is warm and dry.  Psychiatric: His behavior is normal. Judgment and thought content normal. His mood appears anxious.       Assessment & Plan:   Problem List Items Addressed This Visit      Cardiovascular and Mediastinum   Malignant hypertension - Primary    Hypertension remains uncontrolled with current regimen and above goal of 140/90 and exacerbated by anxiety. Obtain BMET to  check electrolytes. Continue current dosage of amlodipine. Increase losartan-hydrochlorothiazide. Encouraged to take amlodipine at night and losartan-hydrochlorothiazide in the morning. Continue to monitor blood pressure at home. Follow-up in one month or sooner if symptoms worsen or do not improve.      Relevant Medications   losartan-hydrochlorothiazide (HYZAAR) 100-25 MG tablet   Other Relevant Orders   Basic Metabolic Panel (BMET)     Other   Anxiety state     Anxiety remains labile and uncontrolled without any current medication intake. He did have the alprazolam he reported improvement in his symptoms. Discussed importance of stress and stress management. Continue current dosage of alprazolam. If symptoms occur on daily basis consider adding a a daily medication. Follow-up in one month or sooner if needed.      Relevant Medications   ALPRAZolam (XANAX) 0.25 MG tablet    Other Visit Diagnoses    Therapeutic drug monitoring        Relevant Orders    Basic Metabolic Panel (BMET)

## 2015-12-07 NOTE — Telephone Encounter (Signed)
Pt aware of results 

## 2015-12-12 ENCOUNTER — Telehealth: Payer: Self-pay | Admitting: Family

## 2015-12-12 MED ORDER — ALLOPURINOL 100 MG PO TABS: 100.0000 mg | ORAL_TABLET | Freq: Every day | ORAL | Status: DC

## 2015-12-12 NOTE — Telephone Encounter (Signed)
Pt request refill for allopurinol (ZYLOPRIM) 100 MG tablet to be send to [facility] in [place]. Please help, he is out

## 2015-12-13 NOTE — Telephone Encounter (Signed)
Rx sent. Pt aware.  

## 2016-01-01 ENCOUNTER — Telehealth: Payer: Self-pay | Admitting: *Deleted

## 2016-01-01 DIAGNOSIS — I10 Essential (primary) hypertension: Secondary | ICD-10-CM

## 2016-01-01 DIAGNOSIS — F411 Generalized anxiety disorder: Secondary | ICD-10-CM

## 2016-01-01 MED ORDER — ALPRAZOLAM 0.25 MG PO TABS: 0.2500 mg | ORAL_TABLET | Freq: Two times a day (BID) | ORAL | Status: DC | PRN

## 2016-01-01 MED ORDER — LOSARTAN POTASSIUM-HCTZ 100-25 MG PO TABS: 1.0000 | ORAL_TABLET | Freq: Every day | ORAL | Status: DC

## 2016-01-01 NOTE — Telephone Encounter (Signed)
Notified pt rx's was approved [facility] in [facility]...Daniel Butler

## 2016-01-01 NOTE — Telephone Encounter (Signed)
Left msg on triage pt states he is needing refills on his Losartan & alprazolam he is currently out. Has appt to see [name] Sours on 01/08/16, but needing medication now. Sent 30 day on Losartan pls advise on controlled...Raechel Chute

## 2016-01-01 NOTE — Telephone Encounter (Signed)
Printed to be refilled.

## 2016-01-08 ENCOUNTER — Ambulatory Visit (INDEPENDENT_AMBULATORY_CARE_PROVIDER_SITE_OTHER): Payer: Self-pay | Admitting: Family

## 2016-01-08 ENCOUNTER — Encounter: Payer: Self-pay | Admitting: Family

## 2016-01-08 VITALS — BP 144/92 | HR 85 | Temp 97.5°F | Resp 16 | Ht 73.0 in | Wt 236.0 lb

## 2016-01-08 DIAGNOSIS — I10 Essential (primary) hypertension: Secondary | ICD-10-CM

## 2016-01-08 MED ORDER — LOSARTAN POTASSIUM-HCTZ 100-25 MG PO TABS: 1.0000 | ORAL_TABLET | Freq: Every day | ORAL | Status: DC

## 2016-01-08 MED ORDER — METOPROLOL SUCCINATE ER 25 MG PO TB24: 25.0000 mg | ORAL_TABLET | Freq: Every day | ORAL | Status: DC

## 2016-01-08 NOTE — Patient Instructions (Addendum)
Thank you for choosing [facility].  Summary/Instructions:  Please continue to take your medications as prescribed.  Please start metoprolol nightly with the amlodipine.  Continue to monitor your blood pressure.   Your prescription(s) have been submitted to your pharmacy or been printed and provided for you. Please take as directed and contact our office if you believe you are having problem(s) with the medication(s) or have any questions.  If your symptoms worsen or fail to improve, please contact our office for further instruction, or in case of emergency go directly to the emergency room at the closest medical facility.

## 2016-01-08 NOTE — Progress Notes (Signed)
Subjective:    Patient ID: Daniel Butler, male    DOB: 09/26/1965, 50 y.o.   MRN: [id number]  Chief Complaint  Patient presents with  . Follow-up    BP follow up, BP has gotten better    HPI:  Daniel Butler is a 50 y.o. male who  has a past medical history of Hypertension; High cholesterol; Gout; Chicken pox; Depression; and Brain aneurysm. and presents today for a follow up office visit.  Hypertension - Currently maintained on amlodipine and losartan-hydrochlorothiazide. Reports taking the medication as prescribed and denies adverse side effects or hypotensive readings. Denies symptoms of end organ damage. Blood pressure at home have been labile and range from 120-170 with timing of his highest blood pressures in the morning. Does report overall feeling better with no headaches, dizziness, or changes in vision.   BP Readings from Last 3 Encounters:  01/08/16 144/92  12/04/15 154/98  05/19/15 208/90    No Known Allergies   Current Outpatient Prescriptions on File Prior to Visit  Medication Sig Dispense Refill  . allopurinol (ZYLOPRIM) 100 MG tablet Take 1 tablet (100 mg total) by mouth daily. 90 tablet 1  . ALPRAZolam (XANAX) 0.25 MG tablet Take 1-2 tablets (0.25-0.5 mg total) by mouth 2 (two) times daily as needed for anxiety. 60 tablet 0  . amLODipine (NORVASC) 10 MG tablet Take 1 tablet (10 mg total) by mouth daily. 90 tablet 3   No current facility-administered medications on file prior to visit.    Review of Systems  Constitutional: Negative for fever and chills.  Eyes:       Negative for changes in vision  Respiratory: Negative for cough, chest tightness and wheezing.   Cardiovascular: Negative for chest pain, palpitations and leg swelling.  Neurological: Negative for dizziness, weakness and light-headedness.      Objective:    BP 144/92 mmHg  Pulse 85  Temp(Src) 97.5 F (36.4 C) (Oral)  Resp 16  Ht  (1.854 m)  Wt 236 lb (107.049 kg)  BMI  31.14 kg/m2  SpO2 98% Nursing note and vital signs reviewed.  Physical Exam  Constitutional: He is oriented to person, place, and time. He appears well-developed and well-nourished. No distress.  Cardiovascular: Normal rate, regular rhythm, normal heart sounds and intact distal pulses.   Pulmonary/Chest: Effort normal and breath sounds normal.  Neurological: He is alert and oriented to person, place, and time.  Skin: Skin is warm and dry.  Psychiatric: He has a normal mood and affect. His behavior is normal. Judgment and thought content normal.       Assessment & Plan:   Problem List Items Addressed This Visit      Cardiovascular and Mediastinum   Malignant hypertension - Primary    Blood pressure with improved control however remains greater than 140/90 on average. No current symptoms of end organ damage. Continue current dosage of losartan-hydrochlorothiazide and amlodipine. Start metoprolol. Continue to monitor blood pressure at home. Encouraged to decrease sodium intake and continue physical activity.       Relevant Medications   metoprolol succinate (TOPROL-XL) 25 MG 24 hr tablet   losartan-hydrochlorothiazide (HYZAAR) 100-25 MG tablet       I have changed Mr. Catala's losartan-hydrochlorothiazide. I am also having him start on metoprolol succinate. Additionally, I am having him maintain his amLODipine, allopurinol, and ALPRAZolam.   Meds ordered this encounter  Medications  . metoprolol succinate (TOPROL-XL) 25 MG 24 hr tablet    Sig: Take  1 tablet (25 mg total) by mouth daily.    Dispense:  30 tablet    Refill:  1    Order Specific Question:  Supervising Provider    Answer:  Hillard Danker A [[id number]]  . losartan-hydrochlorothiazide (HYZAAR) 100-25 MG tablet    Sig: Take 1 tablet by mouth daily.    Dispense:  90 tablet    Refill:  1    Order Specific Question:  Supervising Provider    Answer:  Hillard Danker A [[id number]]     Follow-up: Return in about 1  month (around 02/08/2016), or if symptoms worsen or fail to improve.  Jeanine Luz, FNP

## 2016-01-08 NOTE — Assessment & Plan Note (Signed)
Blood pressure with improved control however remains greater than 140/90 on average. No current symptoms of end organ damage. Continue current dosage of losartan-hydrochlorothiazide and amlodipine. Start metoprolol. Continue to monitor blood pressure at home. Encouraged to decrease sodium intake and continue physical activity.

## 2016-01-08 NOTE — Progress Notes (Signed)
Pre visit review using our clinic review tool, if applicable. No additional management support is needed unless otherwise documented below in the visit note. 

## 2016-01-12 ENCOUNTER — Other Ambulatory Visit (HOSPITAL_COMMUNITY): Payer: Self-pay | Admitting: Neurosurgery

## 2016-01-12 DIAGNOSIS — I609 Nontraumatic subarachnoid hemorrhage, unspecified: Secondary | ICD-10-CM

## 2016-01-23 ENCOUNTER — Other Ambulatory Visit: Payer: Self-pay | Admitting: Family

## 2016-02-16 ENCOUNTER — Other Ambulatory Visit (HOSPITAL_COMMUNITY): Payer: Self-pay | Admitting: Neurosurgery

## 2016-02-16 ENCOUNTER — Ambulatory Visit (HOSPITAL_COMMUNITY)
Admission: RE | Admit: 2016-02-16 | Discharge: 2016-02-16 | Disposition: A | Discharge status: Home / Self Care | Payer: Self-pay | Source: Ambulatory Visit | Attending: Neurosurgery | Admitting: Neurosurgery

## 2016-02-16 DIAGNOSIS — I671 Cerebral aneurysm, nonruptured: Secondary | ICD-10-CM | POA: Insufficient documentation

## 2016-02-16 DIAGNOSIS — I609 Nontraumatic subarachnoid hemorrhage, unspecified: Secondary | ICD-10-CM

## 2016-02-16 DIAGNOSIS — F17210 Nicotine dependence, cigarettes, uncomplicated: Secondary | ICD-10-CM | POA: Insufficient documentation

## 2016-02-16 DIAGNOSIS — Z79899 Other long term (current) drug therapy: Secondary | ICD-10-CM | POA: Insufficient documentation

## 2016-02-16 DIAGNOSIS — M109 Gout, unspecified: Secondary | ICD-10-CM | POA: Insufficient documentation

## 2016-02-16 DIAGNOSIS — I10 Essential (primary) hypertension: Secondary | ICD-10-CM | POA: Insufficient documentation

## 2016-02-16 DIAGNOSIS — E7800 Pure hypercholesterolemia, unspecified: Secondary | ICD-10-CM | POA: Insufficient documentation

## 2016-02-16 HISTORY — PX: IR GENERIC HISTORICAL: IMG1180011

## 2016-02-16 LAB — CBC
HCT: 44.6 % (ref 39.0–52.0)
Hemoglobin: 15.7 g/dL (ref 13.0–17.0)
MCH: 33.4 pg (ref 26.0–34.0)
MCHC: 35.2 g/dL (ref 30.0–36.0)
MCV: 94.9 fL (ref 78.0–100.0)
Platelets: 228 10*3/uL (ref 150–400)
RBC: 4.7 MIL/uL (ref 4.22–5.81)
RDW: 13.3 % (ref 11.5–15.5)
WBC: 7.4 10*3/uL (ref 4.0–10.5)

## 2016-02-16 LAB — BASIC METABOLIC PANEL
Anion gap: 12 (ref 5–15)
BUN: 12 mg/dL (ref 6–20)
CO2: 24 mmol/L (ref 22–32)
Calcium: 9.1 mg/dL (ref 8.9–10.3)
Chloride: 101 mmol/L (ref 101–111)
Creatinine, Ser: 1.01 mg/dL (ref 0.61–1.24)
GFR calc Af Amer: >60 mL/min (ref >60)
GFR calc non Af Amer: >60 mL/min (ref >60)
Glucose, Bld: 109 mg/dL — ABNORMAL HIGH (ref 65–99)
Potassium: 3.7 mmol/L (ref 3.5–5.1)
Sodium: 137 mmol/L (ref 135–145)

## 2016-02-16 LAB — PROTIME-INR
INR: 0.94 (ref 0.00–1.49)
Prothrombin Time: 12.8 seconds (ref 11.6–15.2)

## 2016-02-16 LAB — APTT: aPTT: 24 seconds (ref 24–37)

## 2016-02-16 MED ORDER — HEPARIN SODIUM (PORCINE) 1000 UNIT/ML IJ SOLN
INTRAMUSCULAR | Status: AC
  Filled 2016-02-16: qty 2

## 2016-02-16 MED ORDER — MIDAZOLAM HCL 2 MG/2ML IJ SOLN
INTRAMUSCULAR | Status: AC
  Filled 2016-02-16: qty 2

## 2016-02-16 MED ORDER — HEPARIN SODIUM (PORCINE) 1000 UNIT/ML IJ SOLN
INTRAMUSCULAR | Status: AC | PRN
  Administered 2016-02-16: 2000 [IU] via INTRAVENOUS

## 2016-02-16 MED ORDER — LIDOCAINE HCL 1 % IJ SOLN
INTRAMUSCULAR | Status: AC
  Filled 2016-02-16: qty 20

## 2016-02-16 MED ORDER — FENTANYL CITRATE (PF) 100 MCG/2ML IJ SOLN
INTRAMUSCULAR | Status: AC
  Filled 2016-02-16: qty 2

## 2016-02-16 MED ORDER — SODIUM CHLORIDE 0.9 % IV SOLN: INTRAVENOUS | Status: DC

## 2016-02-16 MED ORDER — MIDAZOLAM HCL 2 MG/2ML IJ SOLN
INTRAMUSCULAR | Status: AC | PRN
Start: 2016-02-16 — End: 2016-02-16
  Administered 2016-02-16: 1 mg via INTRAVENOUS

## 2016-02-16 MED ORDER — HYDROCODONE-ACETAMINOPHEN 5-325 MG PO TABS: 1.0000 | ORAL_TABLET | ORAL | Status: DC | PRN

## 2016-02-16 MED ORDER — IOPAMIDOL (ISOVUE-300) INJECTION 61%
INTRAVENOUS | Status: AC
  Filled 2016-02-16: qty 150

## 2016-02-16 MED ORDER — FENTANYL CITRATE (PF) 100 MCG/2ML IJ SOLN
INTRAMUSCULAR | Status: AC | PRN
  Administered 2016-02-16: 25 ug via INTRAVENOUS

## 2016-02-16 MED ORDER — SODIUM CHLORIDE 0.9 % IV SOLN
INTRAVENOUS | Status: AC | PRN
  Administered 2016-02-16: 10 mL/h via INTRAVENOUS

## 2016-02-16 NOTE — Discharge Instructions (Signed)
Cerebral Angiogram, Care After °Refer to this sheet in the next few weeks. These instructions provide you with information on caring for yourself after your procedure. Your health care provider may also give you more specific instructions. Your treatment has been planned according to current medical practices, but problems sometimes occur. Call your health care provider if you have any problems or questions after your procedure. °WHAT TO EXPECT AFTER THE PROCEDURE °After your procedure, it is typical to have the following: °· Bruising at the catheter insertion site that usually fades within 1-2 weeks. °· Blood collecting in the tissue (hematoma) that may be painful to the touch. It should usually decrease in size and tenderness within 1-2 weeks. °· A mild headache. °HOME CARE INSTRUCTIONS °· Take medicines only as directed by your health care provider. °· You may shower 24-48 hours after the procedure or as directed by your health care provider. Remove the bandage (dressing) and gently wash the site with plain soap and water. Pat the area dry with a clean towel. Do not rub the site, because this may cause bleeding. °· Do not take baths, swim, or use a hot tub until your health care provider approves. °· Check your insertion site every day for redness, swelling, or drainage. °· Do not apply powder or lotion to the site. °· Do not lift over 10 lb (4.5 kg) for 5 days after your procedure or as directed by your health care provider. °· Ask your health care provider when it is okay to: °¨ Return to work or school. °¨ Resume usual physical activities or sports. °¨ Resume sexual activity. °· Do not drive home if you are discharged the same day as the procedure. Have someone else drive you. °· You may drive 24 hours after the procedure unless otherwise instructed by your health care provider. °· Do not operate machinery or power tools for 24 hours after the procedure or as directed by your health care provider. °· If your  procedure was done as an outpatient procedure, which means that you went home the same day as your procedure, a responsible adult should be with you for the first 24 hours after you arrive home. °· Keep all follow-up visits as directed by your health care provider. This is important. °SEEK MEDICAL CARE IF: °· You have a fever. °· You have chills. °· You have increased bleeding from the catheter insertion site. Hold pressure on the site. °SEEK IMMEDIATE MEDICAL CARE IF: °· You have vision changes or loss of vision. °· You have numbness or weakness on one side of your body. °· You have difficulty talking, or you have slurred speech or cannot speak (aphasia). °· You feel confused or have difficulty remembering. °· You have unusual pain at the catheter insertion site. °· You have redness, warmth, or swelling at the catheter insertion site. °· You have drainage (other than a small amount of blood on the dressing) from the catheter insertion site. °· The catheter insertion site is bleeding, and the bleeding does not stop after 30 minutes of holding steady pressure on the site. °These symptoms may represent a serious problem that is an emergency. Do not wait to see if the symptoms will go away. Get medical help right away. Call your local emergency services (911 in [place] Do not drive yourself to the hospital. °  °This information is not intended to replace advice given to you by your health care provider. Make sure you discuss any questions you have with your   health care provider. °  °Document Released: 01/10/2014 Document Revised: 06/14/2014 Document Reviewed: 01/10/2014 °Elsevier Interactive Patient Education ©2016 [employer] ° °

## 2016-02-16 NOTE — Op Note (Signed)
DIAGNOSTIC CEREBRAL ANGIOGRAM    OPERATOR:   Dr. Lisbeth Renshaw, MD  HISTORY:   The patient is a 50 y.o. 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 He made an excellent recovery, and presents today for follow-up angiogram.  APPROACH:   The technical aspects of the procedure as well as its potential risks and benefits were reviewed with the patient. These risks included but were not limited bleeding, infection, allergic reaction, damage to organs/vital structures, stroke, non-diagnostic procedure, and the catastrophic outcomes of heart attack, coma, and death. With an understanding of these risks, informed consent was obtained and witnessed.    The patient was placed in the supine position on the angiography table and the skin of right groin prepped in the usual sterile fashion. The procedure was performed under local anesthesia (1%-solution of bicarbonate-bufferred Lidoacaine) and conscious sedation with Versed and fentanyl monitored by the in-suite nurse.    A 5- French sheath was introduced in the right common femoral artery using Seldinger technique.  A fluorophase sequence was used to document the sheath position.    HEPARIN: 2000 Units total.   CONTRAST AGENT: 60cc, Omnipaque 300   FLUOROSCOPY TIME: See IR records    CATHETER(S) AND WIRE(S):    5-French JB-1 glidecatheter   0.035" glidewire    VESSELS CATHETERIZED:   Right internal carotid   Left internal carotid   Right vertebral   Left vertebral   Right common femoral  VESSELS STUDIED:   Right internal carotid, head Right vertebral Left internal carotid, head Left vertebral Right femoral  PROCEDURAL NARRATIVE:   A 5-Fr JB-1 terumo glide catheter was advanced over a 0.035 glidewire into the aortic arch. The above vessels were then sequentially catheterized and cervical/cerebral angiograms taken. After review of images, the catheter was removed  without incident.    INTERPRETATION:   Right internal carotid: head:   Injection reveals the presence of a widely patent ICA, M1, and A1 segments and their branches. Previously placed aneurysm clip is seen in the region of the right MCA bifurcation. No aneurysm recurrence is seen. The M2 segments are widely patent without stenosis or flow limitation.  The parenchymal and venous phases are normal. The venous sinuses are widely patent.    Left internal carotid: head:   Injection reveals the presence of a widely patent ICA, A1, and M1 segments and their branches. There is no significant stenosis, occlusion, aneurysm, or high flow vascular malformation visualized. The parenchymal and venous phases are normal. The venous sinuses are widely patent.    Left vertebral:   Injection reveals the presence of a widely patent vertebral artery. This leads to a widely patent basilar artery that terminates in bilateral P1. The basilar apex is normal. There is no significant stenosis, occlusion, aneurysm, or vascular malformation visualized. The parenchymal and venous phases are normal. The venous sinuses are widely patent.    Right vertebral:    Normal vessel. No PICA aneurysm. See basilar description above. Note is made of a large occipital scalp vessel arising from the cervical vertebral artery.  Right femoral:    Normal vessel. No significant atherosclerotic disease. Arterial sheath in adequate position.   DISPOSITION:  Upon completion of the study, the femoral sheath was removed and hemostasis obtained using a 5-Fr ExoSeal closure device. Good proximal and distal lower extremity pulses were documented upon achievement of hemostasis.    The procedure was well tolerated and no early  complications were observed.       The patient was transferred back to the holding area to be positioned flat in bed for 3 hours of observation.    IMPRESSION:  1. Complete occlusion of a ruptured right MCA aneurysm one year  after surgical clipping. 2. No other aneurysms, AVM, or fistulas seen.  The preliminary results of this procedure were shared with the patient and the patient's family.

## 2016-02-16 NOTE — H&P (Signed)
CC:  Aneurysm  HPI: Daniel Butler is a 50 y.o. 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 He has made an excellent recovery and presents for routine follow-up diagnostic angiogram  PMH: Past Medical History  Diagnosis Date  . Hypertension   . High cholesterol   . Gout   . Chicken pox   . Depression   . Brain aneurysm     PSH: Past Surgical History  Procedure Laterality Date  . Wisdom tooth extraction    . Radiology with anesthesia N/A 03/02/2015    Procedure: RADIOLOGY WITH ANESTHESIA;  Surgeon: Lisbeth Renshaw, MD;  Location: [facility] OR;  Service: Radiology;  Laterality: N/A;  . Craniotomy Right 03/02/2015    Procedure: CRANIOTOMY INTRACRANIAL  ANEURYSM FOR CLIPPING;  Surgeon: Lisbeth Renshaw, MD;  Location: [facility];  Service: Neurosurgery;  Laterality: Right;    SH: Social History  Substance Use Topics  . Smoking status: Current Every Day Smoker -- 1.00 packs/day for 25 years    Types: Cigarettes  . Smokeless tobacco: Never Used  . Alcohol Use: 21.0 oz/week    35 Cans of beer, 0 Standard drinks or equivalent per week    MEDS: Prior to Admission medications   Medication Sig Start Date End Date Taking? Authorizing Provider  allopurinol (ZYLOPRIM) 100 MG tablet Take 1 tablet (100 mg total) by mouth daily. 12/12/15  Yes Veryl Speak, FNP  ALPRAZolam Prudy Feeler) 0.25 MG tablet Take 1-2 tablets (0.25-0.5 mg total) by mouth 2 (two) times daily as needed for anxiety. 01/01/16  Yes Veryl Speak, FNP  amLODipine (NORVASC) 10 MG tablet Take 1 tablet (10 mg total) by mouth daily. 06/16/15  Yes Veryl Speak, FNP  losartan-hydrochlorothiazide (HYZAAR) 100-25 MG tablet Take 1 tablet by mouth daily. 01/08/16  Yes Veryl Speak, FNP  metoprolol succinate (TOPROL-XL) 25 MG 24 hr tablet Take 1 tablet (25 mg total) by mouth daily. 01/08/16  Yes Veryl Speak, FNP    ALLERGY: No Known Allergies  ROS: ROS  NEUROLOGIC EXAM: Awake,  alert, oriented Memory and concentration grossly intact Speech fluent, appropriate CN grossly intact Motor exam: Upper Extremities Deltoid Bicep Tricep Grip  Right 5/5 5/5 5/5 5/5  Left 5/5 5/5 5/5 5/5   Lower Extremity IP Quad PF DF EHL  Right 5/5 5/5 5/5 5/5 5/5  Left 5/5 5/5 5/5 5/5 5/5   Sensation grossly intact to LT  IMPRESSION: - 50 y.o. male 66yr s/p clipping of ruptured RMCA aneurysm  PLAN: - Proceed with diagnostic cerebral angiogram - Likely home post-procedure  I have reviewed the indications, risks, benefits, and alternatives to angiogram with the patient and his family. All questions were answered.

## 2016-02-16 NOTE — Sedation Documentation (Signed)
5 Fr. Exoseal to right groin 

## 2016-02-19 ENCOUNTER — Telehealth: Payer: Self-pay | Admitting: *Deleted

## 2016-02-19 DIAGNOSIS — F411 Generalized anxiety disorder: Secondary | ICD-10-CM

## 2016-02-19 NOTE — Telephone Encounter (Signed)
Left msg on triage requesting refill on his Alprazolam.../lmb

## 2016-02-20 MED ORDER — ALPRAZOLAM 0.25 MG PO TABS: 0.2500 mg | ORAL_TABLET | Freq: Two times a day (BID) | ORAL | Status: DC | PRN

## 2016-02-20 NOTE — Telephone Encounter (Signed)
Medication refilled

## 2016-02-20 NOTE — Telephone Encounter (Signed)
Notified pt rx faxed to [facility] in [place]...Raechel Chute

## 2016-02-20 NOTE — Telephone Encounter (Signed)
pls advise on msg below.../lmb 

## 2016-03-05 ENCOUNTER — Other Ambulatory Visit: Payer: Self-pay | Admitting: Family

## 2016-06-03 ENCOUNTER — Telehealth: Payer: Self-pay | Admitting: *Deleted

## 2016-06-03 DIAGNOSIS — F411 Generalized anxiety disorder: Secondary | ICD-10-CM

## 2016-06-03 DIAGNOSIS — I10 Essential (primary) hypertension: Secondary | ICD-10-CM

## 2016-06-03 MED ORDER — ALPRAZOLAM 0.25 MG PO TABS: 0.2500 mg | ORAL_TABLET | Freq: Two times a day (BID) | ORAL | 0 refills | Status: DC | PRN

## 2016-06-03 NOTE — Telephone Encounter (Signed)
Medication printed to be faxed.  

## 2016-06-03 NOTE — Telephone Encounter (Signed)
Rec'd call pt is requesting refill on his alprazolam.../lmb

## 2016-06-04 MED ORDER — ALLOPURINOL 100 MG PO TABS: 100.0000 mg | ORAL_TABLET | Freq: Every day | ORAL | 1 refills | Status: DC

## 2016-06-04 MED ORDER — AMLODIPINE BESYLATE 10 MG PO TABS: 10.0000 mg | ORAL_TABLET | Freq: Every day | ORAL | 1 refills | Status: DC

## 2016-06-04 MED ORDER — METOPROLOL SUCCINATE ER 25 MG PO TB24: 25.0000 mg | ORAL_TABLET | Freq: Every day | ORAL | 1 refills | Status: DC

## 2016-06-04 NOTE — Telephone Encounter (Signed)
Notified pt medications has been refilled and sent to [facility]...Raechel Chute

## 2016-06-04 NOTE — Addendum Note (Signed)
Addended by: Deatra James on: 06/04/2016 09:11 AM   Modules accepted: Orders

## 2016-06-05 NOTE — Telephone Encounter (Signed)
Greg faxed script on 9/25, called refill into [facility] had to leave on pharmacy vm...Raechel Chute

## 2016-06-05 NOTE — Telephone Encounter (Signed)
Pt stated that [facility] does not have the ALPRAZolam Prudy Feeler) 0.25 MG tablet we sent in on 06/03/16. Please check

## 2016-06-07 ENCOUNTER — Encounter (HOSPITAL_COMMUNITY): Payer: Self-pay | Admitting: Neurosurgery

## 2016-07-29 ENCOUNTER — Telehealth: Payer: Self-pay | Admitting: Family

## 2016-07-29 DIAGNOSIS — I10 Essential (primary) hypertension: Secondary | ICD-10-CM

## 2016-07-29 MED ORDER — LOSARTAN POTASSIUM-HCTZ 100-25 MG PO TABS: 1.0000 | ORAL_TABLET | Freq: Every day | ORAL | 1 refills | Status: DC

## 2016-07-29 NOTE — Telephone Encounter (Signed)
Pt called in and needs refill on losartan-hydrochlorothiazide (HYZAAR) 100-25 MG tablet [[id number]]   [facility]

## 2016-07-29 NOTE — Telephone Encounter (Signed)
Rx refilled.

## 2016-08-13 ENCOUNTER — Telehealth: Payer: Self-pay | Admitting: *Deleted

## 2016-08-13 ENCOUNTER — Other Ambulatory Visit: Payer: Self-pay | Admitting: Internal Medicine

## 2016-08-13 DIAGNOSIS — F411 Generalized anxiety disorder: Secondary | ICD-10-CM

## 2016-08-13 NOTE — Telephone Encounter (Signed)
Pt left msg on triage requesting refill on his alprazolam.../lmb 

## 2016-08-14 NOTE — Telephone Encounter (Signed)
Rx faxed. Pt aware.

## 2016-08-14 NOTE — Telephone Encounter (Signed)
Done

## 2016-08-14 NOTE — Telephone Encounter (Signed)
Pls advise on msg below.../lmb 

## 2016-09-12 ENCOUNTER — Ambulatory Visit: Payer: Self-pay | Admitting: Family

## 2016-09-16 ENCOUNTER — Encounter: Payer: Self-pay | Admitting: Family

## 2016-09-16 ENCOUNTER — Other Ambulatory Visit (INDEPENDENT_AMBULATORY_CARE_PROVIDER_SITE_OTHER): Payer: Self-pay

## 2016-09-16 ENCOUNTER — Ambulatory Visit (INDEPENDENT_AMBULATORY_CARE_PROVIDER_SITE_OTHER): Payer: Self-pay | Admitting: Family

## 2016-09-16 VITALS — BP 150/92 | HR 89 | Temp 98.1°F | Resp 16 | Ht 73.0 in | Wt 244.0 lb

## 2016-09-16 DIAGNOSIS — R42 Dizziness and giddiness: Secondary | ICD-10-CM

## 2016-09-16 DIAGNOSIS — G479 Sleep disorder, unspecified: Secondary | ICD-10-CM

## 2016-09-16 LAB — COMPREHENSIVE METABOLIC PANEL
ALBUMIN: 4.2 g/dL (ref 3.5–5.2)
ALK PHOS: 82 U/L (ref 39–117)
ALT: 22 U/L (ref 0–53)
AST: 24 U/L (ref 0–37)
BILIRUBIN TOTAL: 0.3 mg/dL (ref 0.2–1.2)
BUN: 15 mg/dL (ref 6–23)
CO2: 26 mEq/L (ref 19–32)
CREATININE: 1.21 mg/dL (ref 0.40–1.50)
Calcium: 9.4 mg/dL (ref 8.4–10.5)
Chloride: 96 mEq/L (ref 96–112)
GFR: 67.09 mL/min (ref >60.00)
GLUCOSE: 122 mg/dL — AB (ref 70–99)
Potassium: 3.6 mEq/L (ref 3.5–5.1)
SODIUM: 133 meq/L — AB (ref 135–145)
TOTAL PROTEIN: 7.2 g/dL (ref 6.0–8.3)

## 2016-09-16 MED ORDER — TRAZODONE HCL 50 MG PO TABS
25.0000 mg | ORAL_TABLET | Freq: Every evening | ORAL | 0 refills | Status: DC | PRN
Start: 2016-09-16 — End: 2017-04-11

## 2016-09-16 NOTE — Progress Notes (Signed)
Subjective:    Patient ID: Daniel Butler, male    DOB: 11/27/1965, 51 y.o.   MRN: [id number]  Chief Complaint  Patient presents with  . Dizziness    x10 days, has been having issues with dizzyness and lightheadedness, has been having lower BP readigs    HPI:  Daniel Butler is a 52 y.o. male who  has a past medical history of Brain aneurysm; Chicken pox; Depression; Gout; High cholesterol; and Hypertension. and presents today for acute office visit.   This is a new problem. Associated symptom of dizziness has been going on for about 10 days. Aggravated by changing positions especially from lying to sitting and sitting standing. Describes more of a disconnected feeling. He has had some lower blood pressures through his at home readings. He stopped taking his medications periodically. Denies any spinning sensation. Endorses that he does not drink a lot of water.   2.) Sleep disturbance -  No Known Allergies    Outpatient Medications Prior to Visit  Medication Sig Dispense Refill  . allopurinol (ZYLOPRIM) 100 MG tablet Take 1 tablet (100 mg total) by mouth daily. 90 tablet 1  . ALPRAZolam (XANAX) 0.25 MG tablet TAKE ONE TO TWO TABLETS BY MOUTH TWICE DAILY AS NEEDED FOR ANXIETY 60 tablet 0  . amLODipine (NORVASC) 10 MG tablet Take 1 tablet (10 mg total) by mouth daily. 90 tablet 1  . losartan-hydrochlorothiazide (HYZAAR) 100-25 MG tablet Take 1 tablet by mouth daily. 90 tablet 1  . metoprolol succinate (TOPROL-XL) 25 MG 24 hr tablet Take 1 tablet (25 mg total) by mouth daily. 90 tablet 1   No facility-administered medications prior to visit.     No Known Allergies   Current Outpatient Prescriptions on File Prior to Visit  Medication Sig Dispense Refill  . allopurinol (ZYLOPRIM) 100 MG tablet Take 1 tablet (100 mg total) by mouth daily. 90 tablet 1  . ALPRAZolam (XANAX) 0.25 MG tablet TAKE ONE TO TWO TABLETS BY MOUTH TWICE DAILY AS NEEDED FOR ANXIETY 60 tablet 0  .  amLODipine (NORVASC) 10 MG tablet Take 1 tablet (10 mg total) by mouth daily. 90 tablet 1  . losartan-hydrochlorothiazide (HYZAAR) 100-25 MG tablet Take 1 tablet by mouth daily. 90 tablet 1  . metoprolol succinate (TOPROL-XL) 25 MG 24 hr tablet Take 1 tablet (25 mg total) by mouth daily. 90 tablet 1   No current facility-administered medications on file prior to visit.      Past Surgical History:  Procedure Laterality Date  . CRANIOTOMY Right 03/02/2015   Procedure: CRANIOTOMY INTRACRANIAL  ANEURYSM FOR CLIPPING;  Surgeon: Lisbeth Renshaw, MD;  Location: [facility];  Service: Neurosurgery;  Laterality: Right;  . IR GENERIC HISTORICAL  02/16/2016   IR ANGIO VERTEBRAL SEL VERTEBRAL BILAT MOD SED 02/16/2016 Lisbeth Renshaw, MD [facility]  . IR GENERIC HISTORICAL  02/16/2016   IR ANGIO INTRA EXTRACRAN SEL INTERNAL CAROTID BILAT MOD SED 02/16/2016 Lisbeth Renshaw, MD [facility]  . RADIOLOGY WITH ANESTHESIA N/A 03/02/2015   Procedure: RADIOLOGY WITH ANESTHESIA;  Surgeon: Lisbeth Renshaw, MD;  Location: [facility] OR;  Service: Radiology;  Laterality: N/A;  . WISDOM TOOTH EXTRACTION       Review of Systems  Constitutional: Negative for chills and fever.  Respiratory: Negative for chest tightness and shortness of breath.   Cardiovascular: Negative for chest pain, palpitations and leg swelling.  Neurological: Positive for dizziness and light-headedness. Negative for syncope and weakness.      Objective:  BP (!) 150/92 (BP Location: Left Arm, Patient Position: Sitting, Cuff Size: Large)   Pulse 89   Temp 98.1 F (36.7 C) (Oral)   Resp 16   Ht 6\' 1"  (1.854 m)   Wt 244 lb (110.7 kg)   SpO2 99%   BMI 32.19 kg/m  Nursing note and vital signs reviewed.  Physical Exam  Constitutional: He is oriented to person, place, and time. He appears well-developed and well-nourished. No distress.  HENT:  Right Ear: Hearing, external ear and ear canal normal.  Left Ear: Hearing, tympanic membrane,  external ear and ear canal normal.  Nose: Nose normal.  Mouth/Throat: Uvula is midline, oropharynx is clear and moist and mucous membranes are normal.  Excessive cerumen noted in right ear.   Eyes: Conjunctivae and EOM are normal. Pupils are equal, round, and reactive to light.  Cardiovascular: Normal rate, regular rhythm, normal heart sounds and intact distal pulses.   Pulmonary/Chest: Effort normal and breath sounds normal.  Neurological: He is alert and oriented to person, place, and time. No cranial nerve deficit.  Skin: Skin is warm and dry.  Psychiatric: He has a normal mood and affect. His behavior is normal. Judgment and thought content normal.       Assessment & Plan:   Problem List Items Addressed This Visit    None       I am having Mr. Delap maintain his allopurinol, amLODipine, metoprolol succinate, losartan-hydrochlorothiazide, and ALPRAZolam.   No orders of the defined types were placed in this encounter.    Follow-up: No Follow-up on file.  Jeanine Luz, FNP

## 2016-09-16 NOTE — Patient Instructions (Addendum)
Thank you for choosing [facility].  SUMMARY AND INSTRUCTIONS:  Debrox or Murine for ear cleaning.   Change positions slowly.   Drink plenty of non-caffeine and non-alcoholic fluids.   Medication:  Your prescription(s) have been submitted to your pharmacy or been printed and provided for you. Please take as directed and contact our office if you believe you are having problem(s) with the medication(s) or have any questions.  Labs:  Please stop by the lab on the lower level of the building for your blood work. Your results will be released to MyChart (or called to you) after review, usually within 72 hours after test completion. If any changes need to be made, you will be notified at that same time.  1.) The lab is open from 7:30am to 5:30 pm Monday-Friday 2.) No appointment is necessary 3.) Fasting (if needed) is 6-8 hours after food and drink; black coffee and water are okay   Follow up:  If your symptoms worsen or fail to improve, please contact our office for further instruction, or in case of emergency go directly to the emergency room at the closest medical facility.     Dizziness Dizziness is a common problem. It is a feeling of unsteadiness or light-headedness. You may feel like you are about to faint. Dizziness can lead to injury if you stumble or fall. Anyone can become dizzy, but dizziness is more common in older adults. This condition can be caused by a number of things, including medicines, dehydration, or illness. Follow these instructions at home: Taking these steps may help with your condition: Eating and drinking  Drink enough fluid to keep your urine clear or pale yellow. This helps to keep you from becoming dehydrated. Try to drink more clear fluids, such as water.  Do not drink alcohol.  Limit your caffeine intake if directed by your health care provider.  Limit your salt intake if directed by your health care provider. Activity  Avoid making  quick movements.  Rise slowly from chairs and steady yourself until you feel okay.  In the morning, first sit up on the side of the bed. When you feel okay, stand slowly while you hold onto something until you know that your balance is fine.  Move your legs often if you need to stand in one place for a long time. Tighten and relax your muscles in your legs while you are standing.  Do not drive or operate heavy machinery if you feel dizzy.  Avoid bending down if you feel dizzy. Place items in your home so that they are easy for you to reach without leaning over. Lifestyle  Do not use any tobacco products, including cigarettes, chewing tobacco, or electronic cigarettes. If you need help quitting, ask your health care provider.  Try to reduce your stress level, such as with yoga or meditation. Talk with your health care provider if you need help. General instructions  Watch your dizziness for any changes.  Take medicines only as directed by your health care provider. Talk with your health care provider if you think that your dizziness is caused by a medicine that you are taking.  Tell a friend or a family member that you are feeling dizzy. If he or she notices any changes in your behavior, have this person call your health care provider.  Keep all follow-up visits as directed by your health care provider. This is important. Contact a health care provider if:  Your dizziness does not go away.  Your dizziness or light-headedness gets worse.  You feel nauseous.  You have reduced hearing.  You have new symptoms.  You are unsteady on your feet or you feel like the room is spinning. Get help right away if:  You vomit or have diarrhea and are unable to eat or drink anything.  You have problems talking, walking, swallowing, or using your arms, hands, or legs.  You feel generally weak.  You are not thinking clearly or you have trouble forming sentences. It may take a friend or  family member to notice this.  You have chest pain, abdominal pain, shortness of breath, or sweating.  Your vision changes.  You notice any bleeding.  You have a headache.  You have neck pain or a stiff neck.  You have a fever. This information is not intended to replace advice given to you by your health care provider. Make sure you discuss any questions you have with your health care provider. Document Released: 02/19/2001 Document Revised: 02/01/2016 Document Reviewed: 08/22/2014 Elsevier Interactive Patient Education  2017 [employer].

## 2016-12-03 ENCOUNTER — Other Ambulatory Visit: Payer: Self-pay | Admitting: Family

## 2016-12-03 DIAGNOSIS — F411 Generalized anxiety disorder: Secondary | ICD-10-CM

## 2016-12-04 NOTE — Telephone Encounter (Signed)
Faxed

## 2016-12-04 NOTE — Telephone Encounter (Signed)
Last refill was 08/14/16. Please advise as [name] Sours is out of office.

## 2016-12-04 NOTE — Telephone Encounter (Signed)
[place] controlled substance database checked.  Ok to fill medication. rx printed 

## 2016-12-06 ENCOUNTER — Other Ambulatory Visit: Payer: Self-pay | Admitting: Family

## 2016-12-06 DIAGNOSIS — F411 Generalized anxiety disorder: Secondary | ICD-10-CM

## 2017-01-17 ENCOUNTER — Other Ambulatory Visit: Payer: Self-pay | Admitting: Family

## 2017-01-17 DIAGNOSIS — I10 Essential (primary) hypertension: Secondary | ICD-10-CM

## 2017-01-23 ENCOUNTER — Other Ambulatory Visit: Payer: Self-pay | Admitting: Family

## 2017-01-23 DIAGNOSIS — I10 Essential (primary) hypertension: Secondary | ICD-10-CM

## 2017-03-04 ENCOUNTER — Other Ambulatory Visit: Payer: Self-pay | Admitting: Internal Medicine

## 2017-03-04 DIAGNOSIS — F411 Generalized anxiety disorder: Secondary | ICD-10-CM

## 2017-03-05 NOTE — Telephone Encounter (Signed)
Rx faxed

## 2017-03-05 NOTE — Telephone Encounter (Signed)
Last refill was 12/04/16 per [place] [facility]

## 2017-03-24 IMAGING — XA IR CAROTID INTERNAL HEAD/NECK BILAT  (MS)
9 series · 15 of 24 positions shown · IV contrast (IODINE)
Comparison: none

[Series 1: carotid care 2 · 2 acquisitions, 2 frames shown (1 of 7)]
[im 1/2  full-range]
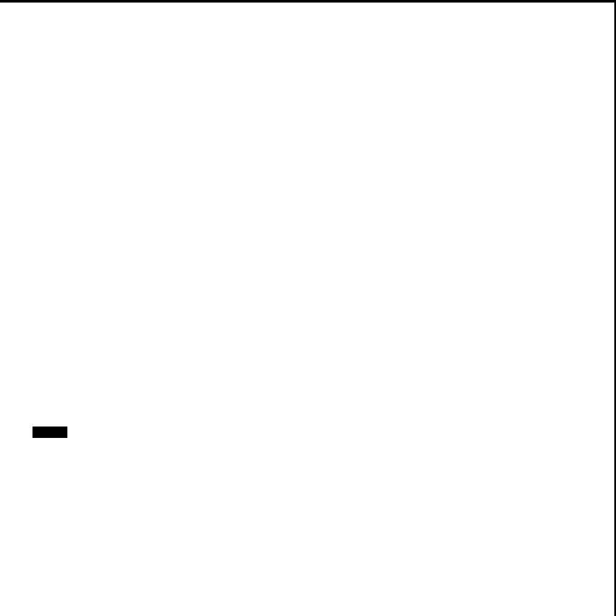
[im 2/2]
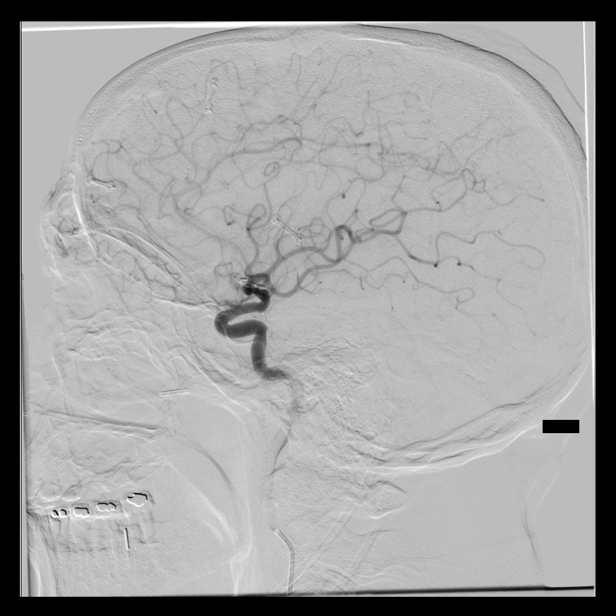

[Series 2: carotid care 2 · 2 acquisitions, 2 frames shown (2 of 7)]
[im 1/2]
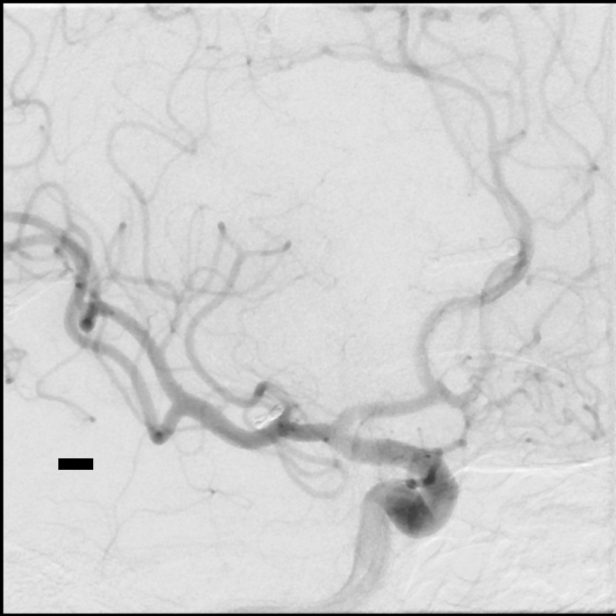
[im 2/2  full-range]
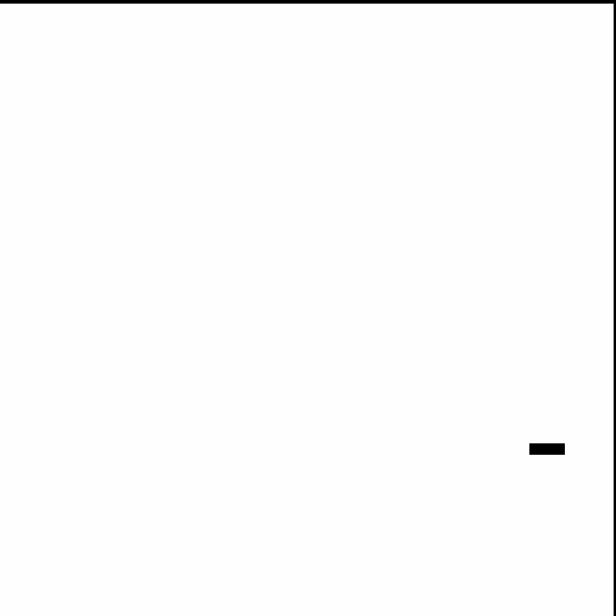

[Series 3: carotid care 2 · 2 acquisitions, 2 frames shown (3 of 7)]
[im 1/2]
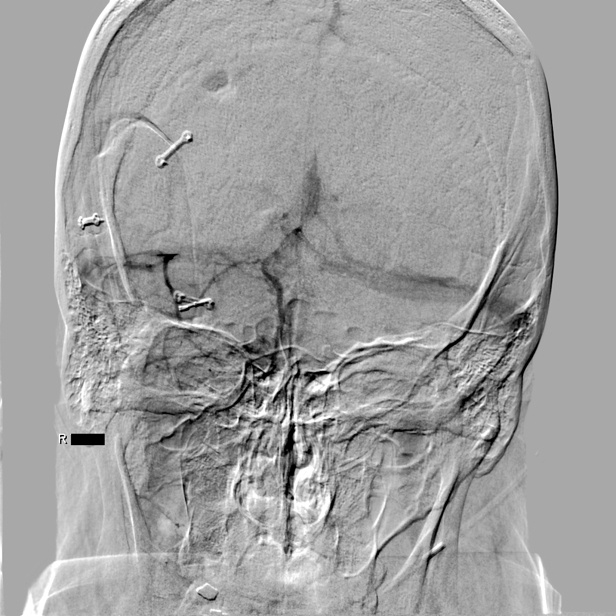
[im 2/2]
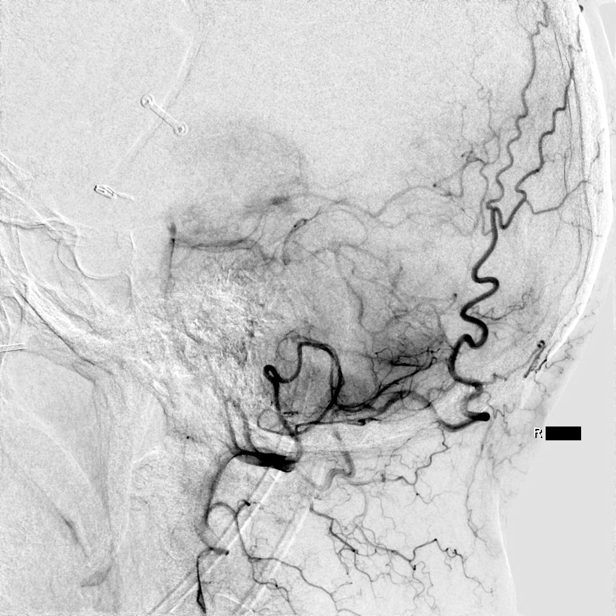

[Series 4: carotid care 2 · 2 acquisitions, 1 frame shown (4 of 7)]
[im 1/2]
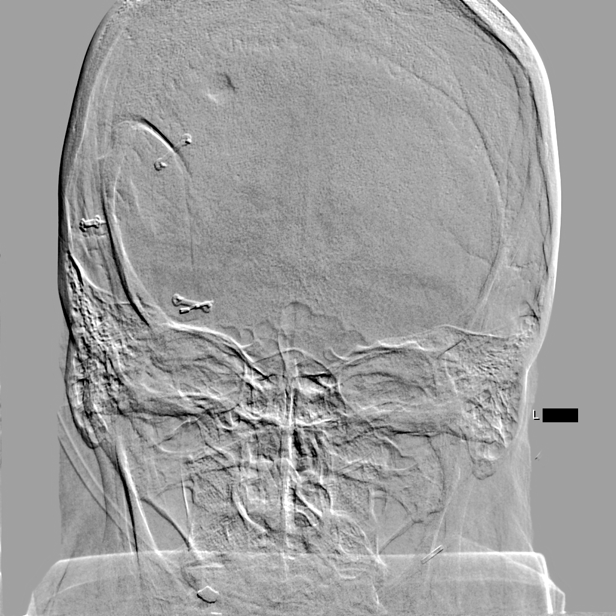

[Series 5: carotid care 2 · 2 acquisitions, 1 frame shown (5 of 7)]
[im 1/2]
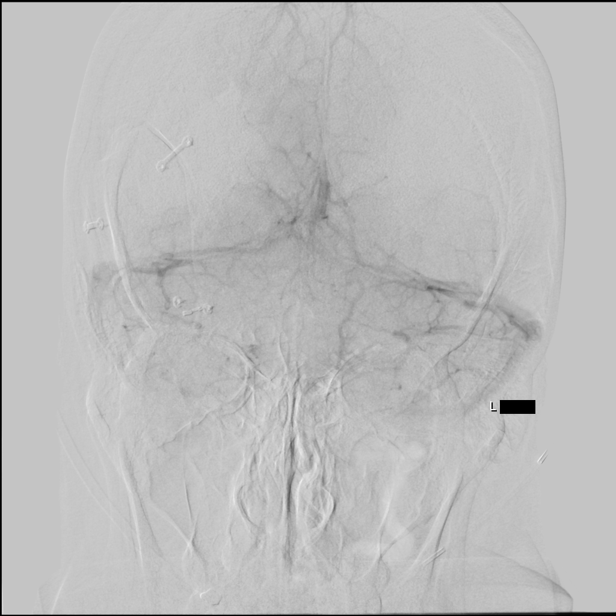

[Series 6: carotid care 2 · 2 acquisitions, 1 frame shown (6 of 7)]
[im 1/2]
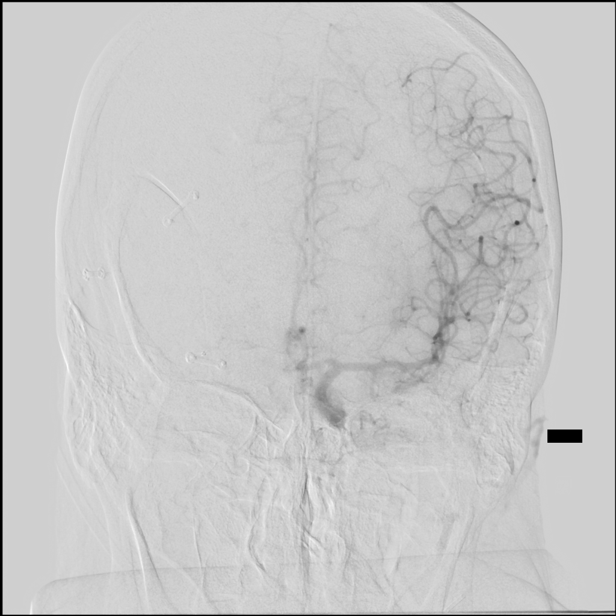

[Series 7: carotid care 2 · 2 acquisitions, 1 frame shown (7 of 7)]
[im 1/2]
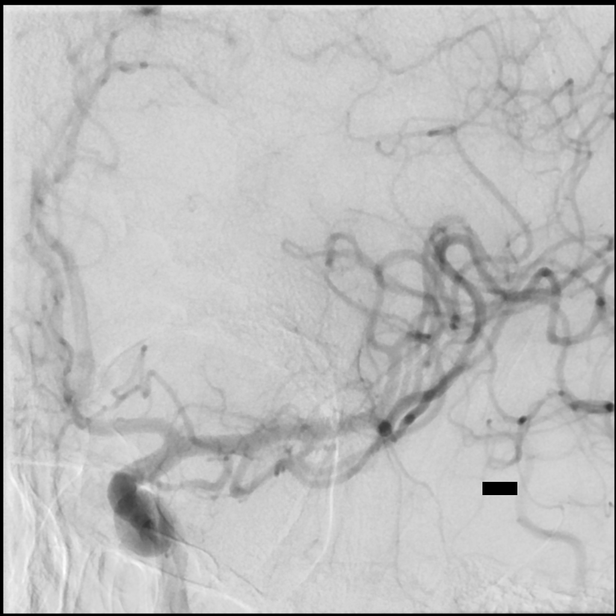

[Series 8: fl neuro n · 1 of 27 frames shown]
[frame 5/27]
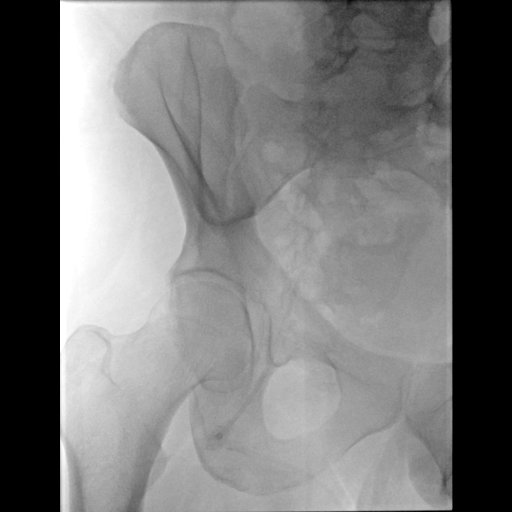

[Series 300: dr. (person_name) · 4 of 18 slices shown]
[im 2/18]
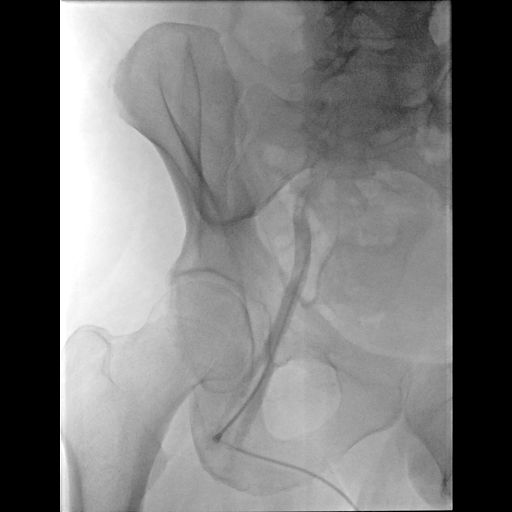
[im 8/18]
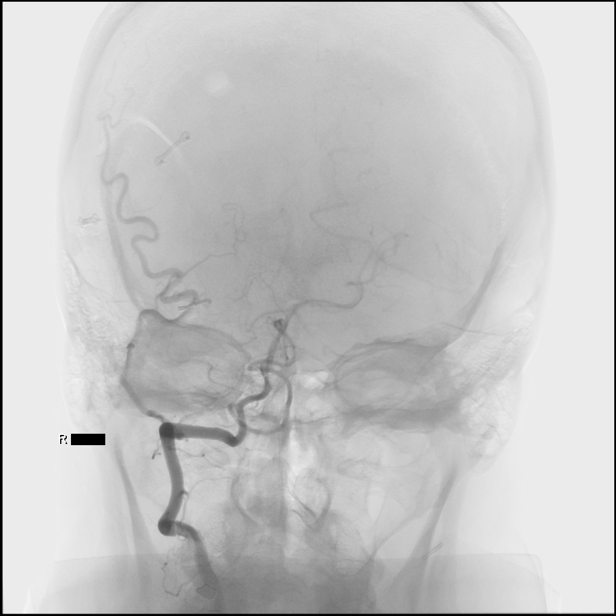
[im 10/18]
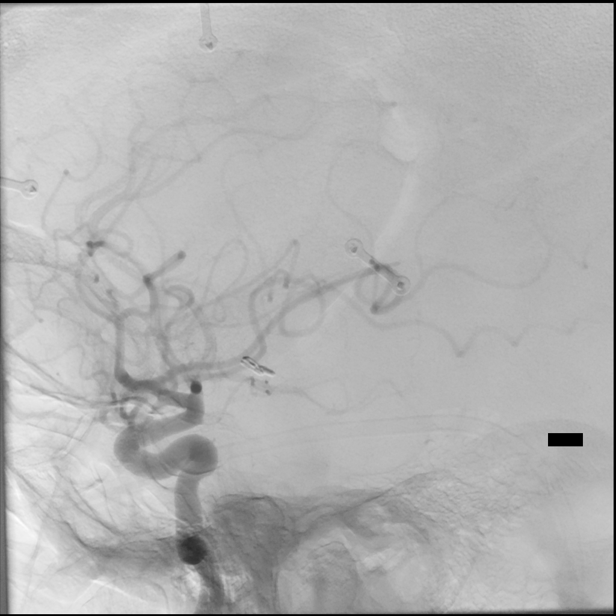
[im 18/18]
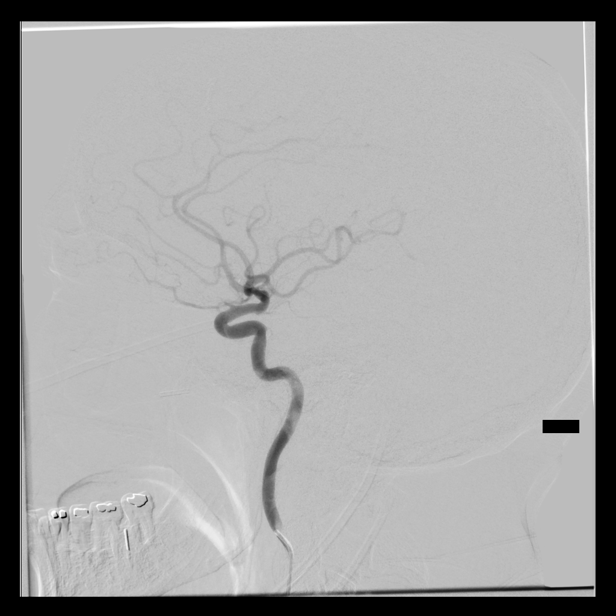

[15 of 24 positions shown; findings below may reference images not displayed]

Canned report from images found in remote index.

Refer to host system for actual result text.

## 2017-04-11 ENCOUNTER — Encounter: Payer: Self-pay | Admitting: Family

## 2017-04-11 ENCOUNTER — Ambulatory Visit (INDEPENDENT_AMBULATORY_CARE_PROVIDER_SITE_OTHER): Payer: Self-pay | Admitting: Family

## 2017-04-11 VITALS — BP 160/100 | HR 87 | Temp 98.4°F | Resp 14 | Ht 73.0 in | Wt 242.0 lb

## 2017-04-11 DIAGNOSIS — F411 Generalized anxiety disorder: Secondary | ICD-10-CM

## 2017-04-11 DIAGNOSIS — M5431 Sciatica, right side: Secondary | ICD-10-CM

## 2017-04-11 MED ORDER — PREDNISONE 10 MG (21) PO TBPK: ORAL_TABLET | ORAL | 0 refills | Status: DC

## 2017-04-11 MED ORDER — ESCITALOPRAM OXALATE 10 MG PO TABS: 10.0000 mg | ORAL_TABLET | Freq: Every day | ORAL | 1 refills | Status: DC

## 2017-04-11 MED ORDER — ALPRAZOLAM 0.5 MG PO TABS: 0.5000 mg | ORAL_TABLET | Freq: Two times a day (BID) | ORAL | 0 refills | Status: DC | PRN

## 2017-04-11 NOTE — Assessment & Plan Note (Signed)
Symptoms and exam consistent with sciatica of the right side secondary to muscle imbalance. Treat conservatively with ice, home exercise therapy, and start prednisone taper. If symptoms worsen consider physical therapy and/or imaging as indicated.

## 2017-04-11 NOTE — Patient Instructions (Addendum)
Thank you for choosing [facility].  SUMMARY AND INSTRUCTIONS:  Ice x 20 minutes every 2 hours and as needed following activities.  Continue with Tylenol as needed for discomfort.  Start the prednisone taper.   For your anxiety we will start Lexapro and increase your dosage of alprazolam.  If your symptoms worsen or do not improve we will consider therapy and imaging.   Medication:  Your prescription(s) have been submitted to your pharmacy or been printed and provided for you. Please take as directed and contact our office if you believe you are having problem(s) with the medication(s) or have any questions.  Follow up:  If your symptoms worsen or fail to improve, please contact our office for further instruction, or in case of emergency go directly to the emergency room at the closest medical facility.     Spondylolisthesis Rehab Ask your health care provider which exercises are safe for you. Do exercises exactly as told by your health care provider and adjust them as directed. It is normal to feel mild stretching, pulling, tightness, or discomfort as you do these exercises, but you should stop right away if you feel sudden pain or your pain gets worse. Do not begin these exercises until told by your health care provider. Stretching and range of motion exercises These exercises warm up your muscles and joints and improve the movement and flexibility of your hips and your back. These exercises may also help to relieve pain, numbness, and tingling. Exercise A: Single knee to chest  1. Lie on your back on a firm surface with both legs straight. 2. Bend one of your knees. Use your hands to move your knee up toward your chest until you feel a gentle stretch in your lower back and buttock. ? Hold your leg in this position by holding onto the front of your knee. ? Keep your other leg as straight as possible. 3. Hold for __________ seconds. 4. Slowly return to the starting  position. 5. Repeat this exercise with your other leg. Repeat __________ times. Complete this exercise __________ times a day. Exercise B: Double knee to chest  1. Lie on your back on a firm surface with both legs straight. 2. Bend one of your knees and move it toward your chest until you feel a gentle stretch in your lower back and buttock. 3. Tense your abdominal muscles and repeat the previous step with your other leg. 4. Hold both of your legs in this position by holding onto the backs of your thighs or the fronts of your knees. 5. Hold for __________ seconds. 6. Tense your abdominal muscles and slowly move your legs back to the floor, one leg at a time. Repeat __________ times. Complete this exercise __________ times a day. Strengthening exercises These exercises build strength and endurance in your back. Endurance is the ability to use your muscles for a long time, even after they get tired. Exercise C: Pelvic tilt 1. Lie on your back on a firm bed or the floor. Bend your knees and keep your feet flat. 2. Tense your abdominal muscles. Tip your pelvis up toward the ceiling and flatten your lower back into the floor. ? To help with this exercise, you may place a small towel under your lower back and try to push your back into the towel. 3. Hold for __________ seconds. 4. Let your muscles relax completely before you repeat this exercise. Repeat __________ times. Complete this exercise __________ times a day. Exercise D: Abdominal  crunch  1. Lie on your back on a firm surface. Bend your knees and keep your feet flat. Cross your arms over your chest. 2. Tuck your chin down toward your chest, without bending your neck. 3. Use your abdominal muscles to lift your upper body off of the ground, straight up into the air. ? Try to lift yourself until your shoulder blades are off the ground. You may need to work up to this. ? Keep your lower back on the ground while you crunch upward. ? Do not  hold your breath. 4. Slowly lower yourself down. Keep your abdominal muscles tense until you are back to the starting position. Repeat __________ times. Complete this exercise __________ times a day. Exercise E: Alternating arm and leg raises  1. Get on your hands and knees on a firm surface. If you are on a hard floor, you may want to use padding to cushion your knees, such as an exercise mat. 2. Line up your arms and legs. Your hands should be below your shoulders, and your knees should be below your hips. 3. Lift your left leg behind you. At the same time, raise your right arm and straighten it in front of you. ? Do not lift your leg higher than your hip. ? Do not lift your arm higher than your shoulder. ? Keep your abdominal and back muscles tight. ? Keep your hips facing the ground. ? Do not arch your back. ? Keep your balance carefully, and do not hold your breath. 4. Hold for __________ seconds. 5. Slowly return to the starting position and repeat with your right leg and your left arm. Repeat __________ times. Complete this exercise __________ times a day. Posture and body mechanics  Body mechanics refers to the movements and positions of your body while you do your daily activities. Posture is part of body mechanics. Good posture and healthy body mechanics can help to relieve stress in your body's tissues and joints. Good posture means that your spine is in its natural S-curve position (your spine is neutral), your shoulders are pulled back slightly, and your head is not tipped forward. The following are general guidelines for applying improved posture and body mechanics to your everyday activities. Standing   When standing, keep your spine neutral and your feet about hip-width apart. Keep a slight bend in your knees. Your ears, shoulders, and hips should line up.  When you do a task in which you stand in one place for a long time, place one foot up on a stable object that is 2-4  inches (5-10 cm) high, such as a footstool. This helps keep your spine neutral. Sitting   When sitting, keep your spine neutral and keep your feet flat on the floor. Use a footrest, if necessary, and keep your thighs parallel to the floor. Avoid rounding your shoulders, and avoid tilting your head forward.  When working at a desk or a computer, keep your desk at a height where your hands are slightly lower than your elbows. Slide your chair under your desk so you are close enough to maintain good posture.  When working at a computer, place your monitor at a height where you are looking straight ahead and you do not have to tilt your head forward or downward to look at the screen. Resting  When lying down and resting, avoid positions that are most painful for you.  If you have pain with activities such as sitting, bending, stooping, or squatting (flexion-based  activities), lie in a position in which your body does not bend very much. For example, avoid curling up on your side with your arms and knees near your chest (fetal position).  If you have pain with activities such as standing for a long time or reaching with your arms (extension-based activities), lie with your spine in a neutral position and bend your knees slightly. Try the following positions: ? Lying on your side with a pillow between your knees. ? Lying on your back with a pillow under your knees.  Lifting   When lifting objects, keep your feet at least shoulder-width apart and tighten your abdominal muscles.  Bend your knees and hips and keep your spine neutral. It is important to lift using the strength of your legs, not your back. Do not lock your knees straight out.  Always ask for help to lift heavy or awkward objects. This information is not intended to replace advice given to you by your health care provider. Make sure you discuss any questions you have with your health care provider. Document Released: 08/26/2005  Document Revised: 05/02/2016 Document Reviewed: 06/06/2015 Elsevier Interactive Patient Education  [employer].

## 2017-04-11 NOTE — Assessment & Plan Note (Signed)
Continues to experience generalized anxiety mostly situational including going up and down steps and while driving on occasion. Start Lexapro. Increase alprazolam. Encouraged not to drive while taking alprazolam. Discussed importance of stress and stress management. Declines counseling at this time. Follow-up in one month or sooner if needed.

## 2017-04-11 NOTE — Progress Notes (Signed)
Subjective:    Patient ID: Daniel Butler, male    DOB: 10-06-1965, 51 y.o.   MRN: [id number]  Chief Complaint  Patient presents with  . Acute Visit    right hip pain, started in lower back and has moved down to hip and down leg     HPI:  Daniel Butler is a 51 y.o. male who  has a past medical history of Brain aneurysm; Chicken pox; Depression; Gout; High cholesterol; and Hypertension. and presents today for an office visit.   1.) Back pain - This is a new problem. Associated symptom of pain located in his lower back and radiating to his right hip and down his leg has been going on for about 2 weeks with no trauma or injury that he is aware of. Had a pinched nerve years ago located in the same spot.Marland Kitchen Describes a sensation like someone stuck something in his back and it remains stuck in there. Modifying factors include rest and ibuprofen which helped at first but then wore off. Denies any saddle anesthesia or changes to bowel habits.   2.) Anxiety - Currently maintained on alprazolam as needed. Reports taking the medication as prescribed and denies adverse side effects. 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    No Known Allergies    Outpatient Medications Prior to Visit  Medication Sig Dispense Refill  . allopurinol (ZYLOPRIM) 100 MG tablet TAKE ONE TABLET BY MOUTH DAILY 90 tablet 1  . amLODipine (NORVASC) 10 MG tablet TAKE ONE TABLET BY MOUTH DAILY 90 tablet 1  . losartan-hydrochlorothiazide (HYZAAR) 100-25 MG tablet TAKE ONE TABLET BY MOUTH  DAILY 90 tablet 1  . metoprolol succinate (TOPROL-XL) 25 MG 24 hr tablet Take 1 tablet (25 mg total) by mouth daily. 90 tablet 1  . ALPRAZolam (XANAX) 0.25 MG tablet TAKE 1 TO 2 TABLETS BY MOUTH TWICE DAILY AS NEEDED FOR ANXIETY 60 tablet 0  . traZODone (DESYREL) 50 MG tablet Take 0.5-1 tablets (25-50 mg total) by  mouth at bedtime as needed for sleep. 30 tablet 0   No facility-administered medications prior to visit.       Past Surgical History:  Procedure Laterality Date  . CRANIOTOMY Right 03/02/2015   Procedure: CRANIOTOMY INTRACRANIAL  ANEURYSM FOR CLIPPING;  Surgeon: Lisbeth Renshaw, MD;  Location: [facility];  Service: Neurosurgery;  Laterality: Right;  . IR GENERIC HISTORICAL  02/16/2016   IR ANGIO VERTEBRAL SEL VERTEBRAL BILAT MOD SED 02/16/2016 Lisbeth Renshaw, MD [facility]  . IR GENERIC HISTORICAL  02/16/2016   IR ANGIO INTRA EXTRACRAN SEL INTERNAL CAROTID BILAT MOD SED 02/16/2016 Lisbeth Renshaw, MD [facility]  . RADIOLOGY WITH ANESTHESIA N/A 03/02/2015   Procedure: RADIOLOGY WITH ANESTHESIA;  Surgeon: Lisbeth Renshaw, MD;  Location: [facility] OR;  Service: Radiology;  Laterality: N/A;  . WISDOM TOOTH EXTRACTION        Past Medical History:  Diagnosis Date  . Brain aneurysm   . Chicken pox   . Depression   . Gout   . High cholesterol   . Hypertension       Review of Systems  Constitutional: Negative for chills and fever.  Respiratory: Negative for chest tightness, shortness of breath and wheezing.   Cardiovascular: Negative for chest pain, palpitations and leg swelling.  Musculoskeletal: Positive for back pain.  Neurological: Positive for weakness. Negative for numbness.  Objective:    BP (!) 160/100 (BP Location: Left Arm, Patient Position: Sitting, Cuff Size: Normal)   Pulse 87   Temp 98.4 F (36.9 C) (Oral)   Resp 14   Ht 6\' 1"  (1.854 m)   Wt 242 lb (109.8 kg)   SpO2 99%   BMI 31.93 kg/m  Nursing note and vital signs reviewed.  Physical Exam  Constitutional: He is oriented to person, place, and time. He appears well-developed and well-nourished. No distress.  Cardiovascular: Normal rate, regular rhythm, normal heart sounds and intact distal pulses.   Pulmonary/Chest: Effort normal and breath sounds normal. No respiratory distress. He has no wheezes.  He has no rales. He exhibits no tenderness.  Musculoskeletal:  Low back/right hip - no obvious deformity, discoloration, or edema. Palpable tenderness over piriformis and gluteus medius. There is muscle imbalance on the right side with positive flexion test. Positive Trendelenburg and Faber's test. Straight leg raise is negative. Distal pulses and sensation are intact and appropriate.   Neurological: He is alert and oriented to person, place, and time.  Skin: Skin is warm and dry.  Psychiatric: His behavior is normal. Judgment and thought content normal. His mood appears anxious.       Assessment & Plan:   Problem List Items Addressed This Visit      Nervous and Auditory   Sciatica of right side - Primary    Symptoms and exam consistent with sciatica of the right side secondary to muscle imbalance. Treat conservatively with ice, home exercise therapy, and start prednisone taper. If symptoms worsen consider physical therapy and/or imaging as indicated.      Relevant Medications   ALPRAZolam (XANAX) 0.5 MG tablet   escitalopram (LEXAPRO) 10 MG tablet     Other   Anxiety state    Continues to experience generalized anxiety mostly situational including going up and down steps and while driving on occasion. Start Lexapro. Increase alprazolam. Encouraged not to drive while taking alprazolam. Discussed importance of stress and stress management. Declines counseling at this time. Follow-up in one month or sooner if needed.      Relevant Medications   ALPRAZolam (XANAX) 0.5 MG tablet   escitalopram (LEXAPRO) 10 MG tablet       I have discontinued Mr. Blackwelder's traZODone. I have also changed his ALPRAZolam. Additionally, I am having him start on predniSONE and escitalopram. Lastly, I am having him maintain his metoprolol succinate, allopurinol, amLODipine, and losartan-hydrochlorothiazide.   Meds ordered this encounter  Medications  . predniSONE (STERAPRED UNI-PAK 21 TAB) 10 MG (21)  TBPK tablet    Sig: Take 6 tablets x 1 day, 5 tablets x 1 day, 4 tablets x 1 day, 3 tablets x 1 day, 2 tablets x 1 day, 1 tablet x 1 day    Dispense:  21 tablet    Refill:  0    Order Specific Question:   Supervising Provider    Answer:   Hillard Danker A [[id number]]  . ALPRAZolam (XANAX) 0.5 MG tablet    Sig: Take 1 tablet (0.5 mg total) by mouth 2 (two) times daily as needed for anxiety.    Dispense:  60 tablet    Refill:  0    Order Specific Question:   Supervising Provider    Answer:   Hillard Danker A [[id number]]  . escitalopram (LEXAPRO) 10 MG tablet    Sig: Take 1 tablet (10 mg total) by mouth daily.    Dispense:  30 tablet  Refill:  1    Order Specific Question:   Supervising Provider    Answer:   Hillard Danker A [[id number]]     Follow-up: Return in about 1 month (around 05/12/2017), or if symptoms worsen or fail to improve.  Jeanine Luz, FNP

## 2017-06-03 ENCOUNTER — Other Ambulatory Visit: Payer: Self-pay | Admitting: Family

## 2017-06-03 DIAGNOSIS — F411 Generalized anxiety disorder: Secondary | ICD-10-CM

## 2017-06-04 NOTE — Telephone Encounter (Signed)
FAXED SCRIPT BACK [facility]..../LMB

## 2017-06-06 ENCOUNTER — Other Ambulatory Visit: Payer: Self-pay | Admitting: Family

## 2017-07-17 ENCOUNTER — Other Ambulatory Visit: Payer: Self-pay | Admitting: *Deleted

## 2017-07-17 DIAGNOSIS — F411 Generalized anxiety disorder: Secondary | ICD-10-CM

## 2017-07-24 ENCOUNTER — Other Ambulatory Visit (INDEPENDENT_AMBULATORY_CARE_PROVIDER_SITE_OTHER): Payer: Self-pay

## 2017-07-24 ENCOUNTER — Ambulatory Visit: Payer: Self-pay | Admitting: Internal Medicine

## 2017-07-24 ENCOUNTER — Encounter: Payer: Self-pay | Admitting: Internal Medicine

## 2017-07-24 DIAGNOSIS — F411 Generalized anxiety disorder: Secondary | ICD-10-CM

## 2017-07-24 DIAGNOSIS — I10 Essential (primary) hypertension: Secondary | ICD-10-CM

## 2017-07-24 LAB — LIPID PANEL
CHOL/HDL RATIO: 5
Cholesterol: 194 mg/dL (ref 0–200)
HDL: 37 mg/dL — AB (ref >39.00)
Triglycerides: 475 mg/dL — ABNORMAL HIGH (ref 0.0–149.0)

## 2017-07-24 LAB — COMPREHENSIVE METABOLIC PANEL
ALBUMIN: 4.2 g/dL (ref 3.5–5.2)
ALT: 26 U/L (ref 0–53)
AST: 34 U/L (ref 0–37)
Alkaline Phosphatase: 85 U/L (ref 39–117)
BUN: 17 mg/dL (ref 6–23)
CHLORIDE: 98 meq/L (ref 96–112)
CO2: 26 meq/L (ref 19–32)
Calcium: 9.2 mg/dL (ref 8.4–10.5)
Creatinine, Ser: 1.62 mg/dL — ABNORMAL HIGH (ref 0.40–1.50)
GFR: 47.75 mL/min — ABNORMAL LOW (ref >60.00)
GLUCOSE: 160 mg/dL — AB (ref 70–99)
Potassium: 4.1 mEq/L (ref 3.5–5.1)
SODIUM: 133 meq/L — AB (ref 135–145)
TOTAL PROTEIN: 6.9 g/dL (ref 6.0–8.3)
Total Bilirubin: 0.4 mg/dL (ref 0.2–1.2)

## 2017-07-24 LAB — CBC
HEMATOCRIT: 48.2 % (ref 39.0–52.0)
Hemoglobin: 16.3 g/dL (ref 13.0–17.0)
MCHC: 33.8 g/dL (ref 30.0–36.0)
MCV: 103.5 fl — ABNORMAL HIGH (ref 78.0–100.0)
Platelets: 263 10*3/uL (ref 150.0–400.0)
RBC: 4.66 Mil/uL (ref 4.22–5.81)
RDW: 14.7 % (ref 11.5–15.5)
WBC: 10 10*3/uL (ref 4.0–10.5)

## 2017-07-24 LAB — LDL CHOLESTEROL, DIRECT: Direct LDL: 97 mg/dL

## 2017-07-24 LAB — HEMOGLOBIN A1C: HEMOGLOBIN A1C: 5.1 % (ref 4.6–6.5)

## 2017-07-24 MED ORDER — LOSARTAN POTASSIUM-HCTZ 100-25 MG PO TABS: 1.0000 | ORAL_TABLET | Freq: Every day | ORAL | 0 refills | Status: DC

## 2017-07-24 MED ORDER — ESCITALOPRAM OXALATE 10 MG PO TABS: 10.0000 mg | ORAL_TABLET | Freq: Every day | ORAL | 0 refills | Status: DC

## 2017-07-24 MED ORDER — ALPRAZOLAM 0.5 MG PO TABS: 0.5000 mg | ORAL_TABLET | Freq: Two times a day (BID) | ORAL | 2 refills | Status: DC | PRN

## 2017-07-24 NOTE — Patient Instructions (Signed)
We will check the labs today and have sent in the refills until January.

## 2017-07-24 NOTE — Progress Notes (Signed)
   Subjective:    Patient ID: Daniel Butler, male    DOB: 03/18/1966, 51 y.o.   MRN: [id number]  HPI  The patient is a 51 YO man coming in for follow up of anxiety. He was started on lexapro back in August and never followed up as requested.  He does feel that the Lexapro has helped improve his temperament.  He does not get angry quite as easily.  He is continuing to use his Xanax twice daily every day.  He uses it to sleep as well as occasionally during the day if situations are very intense.  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  He does drive every day.  States that generally when he gets home he just "zones out on the couch".  Denies accidents or speeding violations.  Review of Systems  Constitutional: Negative.   Respiratory: Negative for cough, chest tightness and shortness of breath.   Cardiovascular: Negative for chest pain, palpitations and leg swelling.  Gastrointestinal: Negative for abdominal distention, abdominal pain, constipation, diarrhea, nausea and vomiting.  Musculoskeletal: Negative.   Psychiatric/Behavioral: Negative for behavioral problems, confusion, decreased concentration, dysphoric mood, self-injury, sleep disturbance and suicidal ideas. The patient is nervous/anxious.       Objective:   Physical Exam  Constitutional: He is oriented to person, place, and time. He appears well-developed and well-nourished.  HENT:  Head: Normocephalic and atraumatic.  Eyes: EOM are normal.  Neck: Normal range of motion.  Cardiovascular: Normal rate and regular rhythm.  Pulmonary/Chest: Effort normal and breath sounds normal. No respiratory distress. He has no wheezes. He has no rales.  Abdominal: Soft. Bowel sounds are normal. He exhibits no distension. There is no tenderness. There is no rebound.  Musculoskeletal: He exhibits no edema.  Neurological: He is alert and oriented to person, place, and time. Coordination normal.  Skin: Skin is warm and dry.   Vitals:   07/24/17 1341  BP: (!) 150/88  Pulse: 80  Temp: 98.2 F (36.8 C)  TempSrc: Oral  SpO2: 99%  Weight: 243 lb (110.2 kg)  Height: 6\' 1"  (1.854 m)      Assessment & Plan:

## 2017-07-25 NOTE — Assessment & Plan Note (Signed)
Refill Lexapro and Xanax today.  Have asked him to try to work on using Xanax less.  Counseled him about the risk of memory problems, dependence, increased fall risk from long-term Xanax usage.  He wishes to continue at this time due to the increase in his quality of life.

## 2017-07-29 ENCOUNTER — Telehealth: Payer: Self-pay | Admitting: Internal Medicine

## 2017-07-29 ENCOUNTER — Other Ambulatory Visit: Payer: Self-pay | Admitting: Emergency Medicine

## 2017-07-29 MED ORDER — FENOFIBRATE 145 MG PO TABS: 145.0000 mg | ORAL_TABLET | Freq: Every day | ORAL | 0 refills | Status: DC

## 2017-07-29 NOTE — Telephone Encounter (Signed)
Sent in cholesterol medicine.

## 2017-10-02 ENCOUNTER — Encounter: Payer: Self-pay | Admitting: Nurse Practitioner

## 2017-10-02 ENCOUNTER — Ambulatory Visit (INDEPENDENT_AMBULATORY_CARE_PROVIDER_SITE_OTHER): Payer: Self-pay | Admitting: Nurse Practitioner

## 2017-10-02 VITALS — BP 138/80 | HR 99 | Temp 98.1°F | Resp 16 | Ht 73.0 in | Wt 238.0 lb

## 2017-10-02 DIAGNOSIS — F411 Generalized anxiety disorder: Secondary | ICD-10-CM

## 2017-10-02 DIAGNOSIS — I10 Essential (primary) hypertension: Secondary | ICD-10-CM

## 2017-10-02 MED ORDER — SERTRALINE HCL 25 MG PO TABS: 25.0000 mg | ORAL_TABLET | Freq: Every day | ORAL | 1 refills | Status: DC

## 2017-10-02 NOTE — Assessment & Plan Note (Signed)
He does not like the way lexapro is making him feel. We discussed trying a different SSRI to see if he tolerates better. We also discussed the use of xanax only as needed and infrequently as possible due to the long term risks associated with benzodiazepines including addiction. We discussed working towards a goal of discontinuing the xanax completely if he is able to get full relief of his anxiety with lifestyle measures. We discussed nonpharm management of stress-See AVS for education provided to patient - sertraline (ZOLOFT) 25 MG tablet; Take 1 tablet (25 mg total) by mouth daily.  Dispense: 30 tablet; Refill: 1 He will return in about 3-4 weeks for follow up of zoloft.

## 2017-10-02 NOTE — Assessment & Plan Note (Signed)
He reports labile readings at home. Recent readings in office are normal to elevated. He says he brought his blood pressure monitor into the office for calibration recently. We will continue medications as is for now. I am having him keep a log for the next three weeks or so and return with readings so we can determine if we need to make any medication changes. We also discussed orthostatic hypotension which could be caused by his blood pressure medications and the need to stand slowly and stay well hydrated and he was able to verbalize understanding. We will re-check BMET today due to abnormal BMET in [place] says he cant stay for labs today but will come back one day in the next week or so for labs. - Basic metabolic panel; Future

## 2017-10-02 NOTE — Progress Notes (Signed)
Name: Daniel Butler   MRN: [id number]    DOB: Jan 25, 1966   Date:10/02/2017       Progress Note  Subjective  Chief Complaint  Chief Complaint  Patient presents with  . Establish Care    HPI  Hypertension -maintained on amlodipine 10, losartan-hctz 100-25, metoprolol 25 Reports daily medication compliance. Reports he has noticed some occasional low readings at home over the past few weeks-105/65 yesterday. He said he has felt a little lightheaded when the readings are low.  Denies headaches, vision changes, chest pain, shortness of breath, edema.  BP Readings from Last 3 Encounters:  10/02/17 138/80  07/24/17 130/80  04/11/17 (!) 160/100   Anxiety- maintained on lexapro 10 daily, xanax 0.5 as needed. He does not take the xanax every day, but as needed to help with sleep and also when he is driving. He was started on lexapro in august and feels like his mood is better but he does not like the way the lexapro makes him feel. He says he feels like his head it "foggy and shutting down" since taking the lexapro. He denies thoughts of harming himself or others.  Patient Active Problem List   Diagnosis Date Noted  . Sciatica of right side 04/11/2017  . Lightheadedness 09/16/2016  . Sleep disturbance 05/19/2015  . Anxiety state 05/19/2015  . Generalized headaches 04/18/2015  . Gout 04/18/2015  . Encounter for orogastric (OG) tube placement   . Subarachnoid hemorrhage (HCC) 03/02/2015  . Acute respiratory failure with hypoxia (HCC)   . SAH (subarachnoid hemorrhage) (HCC)   . Subarachnoid bleed (HCC)   . Malignant hypertension     Past Surgical History:  Procedure Laterality Date  . CRANIOTOMY Right 03/02/2015   Procedure: CRANIOTOMY INTRACRANIAL  ANEURYSM FOR CLIPPING;  Surgeon: Lisbeth Renshaw, MD;  Location: [facility];  Service: Neurosurgery;  Laterality: Right;  . IR GENERIC HISTORICAL  02/16/2016   IR ANGIO VERTEBRAL SEL VERTEBRAL BILAT MOD SED 02/16/2016 Lisbeth Renshaw, MD [facility]  . IR GENERIC HISTORICAL  02/16/2016   IR ANGIO INTRA EXTRACRAN SEL INTERNAL CAROTID BILAT MOD SED 02/16/2016 Lisbeth Renshaw, MD [facility]  . RADIOLOGY WITH ANESTHESIA N/A 03/02/2015   Procedure: RADIOLOGY WITH ANESTHESIA;  Surgeon: Lisbeth Renshaw, MD;  Location[facility];  Service: Radiology;  Laterality: N/A;  . WISDOM TOOTH EXTRACTION      Family History  Adopted: Yes    Social History   Socioeconomic History  . Marital status: Married    Spouse name: Not on file  . Number of children: 0  . Years of education: 22  . Highest education level: Not on file  Social Needs  . Financial resource strain: Not on file  . Food insecurity - worry: Not on file  . Food insecurity - inability: Not on file  . Transportation needs - medical: Not on file  . Transportation needs - non-medical: Not on file  Occupational History  . Occupation: Social research officer, government  Tobacco Use  . Smoking status: Current Every Day Smoker    Packs/day: 1.00    Years: 25.00    Pack years: 25.00    Types: Cigarettes  . Smokeless tobacco: Never Used  Substance and Sexual Activity  . Alcohol use: Yes    Alcohol/week: 21.0 oz    Types: 35 Cans of beer per week  . Drug use: No  . Sexual activity: Not on file  Other Topics Concern  . Not on file  Social History Narrative  Denies religious beliefs effecting health care.      Current Outpatient Medications:  .  allopurinol (ZYLOPRIM) 100 MG tablet, TAKE 1 TABLET BY MOUTH DAILY, Disp: 90 tablet, Rfl: 1 .  ALPRAZolam (XANAX) 0.5 MG tablet, Take 1 tablet (0.5 mg total) 2 (two) times daily as needed by mouth. for anxiety, Disp: 60 tablet, Rfl: 2 .  amLODipine (NORVASC) 10 MG tablet, TAKE ONE TABLET BY MOUTH DAILY, Disp: 90 tablet, Rfl: 1 .  escitalopram (LEXAPRO) 10 MG tablet, Take 1 tablet (10 mg total) daily by mouth., Disp: 90 tablet, Rfl: 0 .  losartan-hydrochlorothiazide (HYZAAR) 100-25 MG tablet, Take 1 tablet daily by mouth., Disp:  90 tablet, Rfl: 0 .  metoprolol succinate (TOPROL-XL) 25 MG 24 hr tablet, TAKE ONE TABLET BY MOUTH ONCE DAILY, Disp: 90 tablet, Rfl: 1 .  fenofibrate (TRICOR) 145 MG tablet, Take 1 tablet (145 mg total) by mouth daily. (Patient not taking: Reported on 10/02/2017), Disp: 90 tablet, Rfl: 0  No Known Allergies   ROS See HPI  Objective  Vitals:   10/02/17 1456  BP: 138/80  Pulse: 99  Resp: 16  Temp: 98.1 F (36.7 C)  TempSrc: Oral  SpO2: 98%  Weight: 238 lb (108 kg)  Height: 6\' 1"  (1.854 m)   Body mass index is 31.4 kg/m.  Physical Exam Constitutional: Patient appears well-developed and well-nourished. No distress.  HENT: Head: Normocephalic and atraumatic. Eyes: Conjunctivaeare normal. No scleral icterus.  Neck: Normal range of motion. Neck supple. Cardiovascular: Normal rate, regular rhythm and normal heart sounds. No BLE edema. Distal pulses intact. Pulmonary/Chest: Effort normal and breath sounds normal. No respiratory distress. Musculoskeletal: Normal range of motion, no joint effusions. No gross deformities Neurological: He is alert and oriented to person, place, and time. Coordination, balance, strength, speech and gait are normal.  Skin: Skin is warm and dry.  Psychiatric: Patient has a normal affect. He appears anxious. Judgment and thought content normal.  PHQ2/9: Depression screen [facility] 2/9 07/24/2017 03/02/2015  Decreased Interest 1 0  Down, Depressed, Hopeless 0 0  PHQ - 2 Score 1 0     Assessment & Plan RTC in 3-4 weeks for blood pressure and zoloft follow up.

## 2017-10-02 NOTE — Patient Instructions (Signed)
Please try to check your blood pressure once daily or at least a few times a week, at the same time each day, and keep a log. Id like to see you back in a few weeks with your readings so we can make any necessary changes to your blood pressure medications.  Stop lexapro. Start zoloft 25mg  once daily. Some side effects such as nausea, drowsiness and weight gain can occur.  Also rarely people have experienced suicidal thoughts when taking this medication.  Please discontinue the medication and go directly to ED if this occurs.  Id like to see you back in about 1 month to evaluate progress.    Return one day to the lab to have your kidneys rechecked.  It was good to meet you. Thanks for letting me take care of you today :)   Mindfulness-Based Stress Reduction Mindfulness-based stress reduction (MBSR) is a program that helps people learn to practice mindfulness. Mindfulness is the practice of intentionally paying attention to the present moment. It can be learned and practiced through techniques such as education, breathing exercises, meditation, and yoga. MBSR includes several mindfulness techniques in one program. MBSR works best when you understand the treatment, are willing to try new things, and can commit to spending time practicing what you learn. MBSR training may include learning about:  How your emotions, thoughts, and reactions affect your body.  New ways to respond to things that cause negative thoughts to start (triggers).  How to notice your thoughts and let go of them.  Practicing awareness of everyday things that you normally do without thinking.  The techniques and goals of different types of meditation.  What are the benefits of MBSR? MBSR can have many benefits, which include helping you to:  Develop self-awareness. This refers to knowing and understanding yourself.  Learn skills and attitudes that help you to participate in your own health care.  Learn new ways to care  for yourself.  Be more accepting about how things are, and let things go.  Be less judgmental and approach things with an open mind.  Be patient with yourself and trust yourself more.  MBSR has also been shown to:  Reduce negative emotions, such as depression and anxiety.  Improve memory and focus.  Change how you sense and approach pain.  Boost your body's ability to fight infections.  Help you connect better with other people.  Improve your sense of well-being.  Follow these instructions at home:  Find a local in-person or online MBSR program.  Set aside some time regularly for mindfulness practice.  Find a mindfulness practice that works best for you. This may include one or more of the following: ? Meditation. Meditation involves focusing your mind on a certain thought or activity. ? Breathing awareness exercises. These help you to stay present by focusing on your breath. ? Body scan. For this practice, you lie down and pay attention to each part of your body from head to toe. You can identify tension and soreness and intentionally relax parts of your body. ? Yoga. Yoga involves stretching and breathing, and it can improve your ability to move and be flexible. It can also provide an experience of testing your body's limits, which can help you release stress. ? Mindful eating. This way of eating involves focusing on the taste, texture, color, and smell of each bite of food. Because this slows down eating and helps you feel full sooner, it can be an important part of a weight-loss  plan.  Find a podcast or recording that provides guidance for breathing awareness, body scan, or meditation exercises. You can listen to these any time when you have a free moment to rest without distractions.  Follow your treatment plan as told by your health care provider. This may include taking regular medicines and making changes to your diet or lifestyle as recommended. How to practice  mindfulness To do a basic awareness exercise:  Find a comfortable place to sit.  Pay attention to the present moment. Observe your thoughts, feelings, and surroundings just as they are.  Avoid placing judgment on yourself, your feelings, or your surroundings. Make note of any judgment that comes up, and let it go.  Your mind may wander, and that is okay. Make note of when your thoughts drift, and return your attention to the present moment.  To do basic mindfulness meditation:  Find a comfortable place to sit. This may include a stable chair or a firm floor cushion. ? Sit upright with your back straight. Let your arms fall next to your side with your hands resting on your legs. ? If sitting in a chair, rest your feet flat on the floor. ? If sitting on a cushion, cross your legs in front of you.  Keep your head in a neutral position with your chin dropped slightly. Relax your jaw and rest the tip of your tongue on the roof of your mouth. Drop your gaze to the floor. You can close your eyes if you like.  Breathe normally and pay attention to your breath. Feel the air moving in and out of your nose. Feel your belly expanding and relaxing with each breath.  Your mind may wander, and that is okay. Make note of when your thoughts drift, and return your attention to your breath.  Avoid placing judgment on yourself, your feelings, or your surroundings. Make note of any judgment or feelings that come up, let them go, and bring your attention back to your breath.  When you are ready, lift your gaze or open your eyes. Pay attention to how your body feels after the meditation.  Where to find more information: You can find more information about MBSR from:  Your health care provider.  Community-based meditation centers or programs.  Programs offered near you.  Summary  Mindfulness-based stress reduction (MBSR) is a program that teaches you how to intentionally pay attention to the present  moment. It is used with other treatments to help you cope better with daily stress, emotions, and pain.  MBSR focuses on developing self-awareness, which allows you to respond to life stress without judgment or negative emotions.  MBSR programs may involve learning different mindfulness practices, such as breathing exercises, meditation, yoga, body scan, or mindful eating. Find a mindfulness practice that works best for you, and set aside time for it on a regular basis. This information is not intended to replace advice given to you by your health care provider. Make sure you discuss any questions you have with your health care provider. Document Released: 01/02/2017 Document Revised: 01/02/2017 Document Reviewed: 01/02/2017 Elsevier Interactive Patient Education  [employer].

## 2017-10-27 ENCOUNTER — Other Ambulatory Visit: Payer: Self-pay | Admitting: Internal Medicine

## 2017-10-27 DIAGNOSIS — I10 Essential (primary) hypertension: Secondary | ICD-10-CM

## 2017-11-04 ENCOUNTER — Ambulatory Visit: Payer: Self-pay | Admitting: Nurse Practitioner

## 2017-12-10 ENCOUNTER — Other Ambulatory Visit: Payer: Self-pay | Admitting: Family

## 2017-12-12 ENCOUNTER — Encounter: Payer: Self-pay | Admitting: Nurse Practitioner

## 2017-12-12 NOTE — Telephone Encounter (Signed)
Last OV: 10/02/17-no mention of gout and allopurinol  PCP: [facility]  Pharmacy: [facility] [address] [place], [place] - [address] [phone] (Phone) [phone] (Fax)

## 2017-12-12 NOTE — Addendum Note (Signed)
Addended by: Wilford Corner on: 12/12/2017 05:42 PM   Modules accepted: Orders

## 2017-12-12 NOTE — Telephone Encounter (Signed)
Pt was seen by Morrie Sheldon and she is current pcp. [facility] was told that [facility] is not his pcp.  Pt needs to have this refill, he took his last pill this am.pt is out of medication.  [facility] - [place]  cb for pt

## 2017-12-15 MED ORDER — ALLOPURINOL 100 MG PO TABS: 100.0000 mg | ORAL_TABLET | Freq: Every day | ORAL | 1 refills | Status: DC

## 2017-12-15 NOTE — Telephone Encounter (Signed)
Reviewed chart pt this is a maintenance med was originally rx by Marcos Eke and at his last visit w/him 05/2017 he states to continue taking is up-to-date sent refills to [facility].Marland KitchenRaechel Chute

## 2017-12-15 NOTE — Addendum Note (Signed)
Addended by: Deatra James on: 12/15/2017 08:54 AM   Modules accepted: Orders

## 2018-01-19 ENCOUNTER — Other Ambulatory Visit (INDEPENDENT_AMBULATORY_CARE_PROVIDER_SITE_OTHER): Payer: Self-pay

## 2018-01-19 ENCOUNTER — Ambulatory Visit (INDEPENDENT_AMBULATORY_CARE_PROVIDER_SITE_OTHER): Payer: Self-pay | Admitting: Nurse Practitioner

## 2018-01-19 ENCOUNTER — Encounter: Payer: Self-pay | Admitting: Nurse Practitioner

## 2018-01-19 ENCOUNTER — Encounter

## 2018-01-19 VITALS — BP 128/80 | HR 96 | Temp 98.6°F | Resp 16 | Ht 73.0 in | Wt 235.0 lb

## 2018-01-19 DIAGNOSIS — E781 Pure hyperglyceridemia: Secondary | ICD-10-CM | POA: Insufficient documentation

## 2018-01-19 DIAGNOSIS — E785 Hyperlipidemia, unspecified: Secondary | ICD-10-CM

## 2018-01-19 DIAGNOSIS — F411 Generalized anxiety disorder: Secondary | ICD-10-CM

## 2018-01-19 DIAGNOSIS — I10 Essential (primary) hypertension: Secondary | ICD-10-CM

## 2018-01-19 LAB — BASIC METABOLIC PANEL
BUN: 11 mg/dL (ref 6–23)
CHLORIDE: 95 meq/L — AB (ref 96–112)
CO2: 29 mEq/L (ref 19–32)
Calcium: 9.4 mg/dL (ref 8.4–10.5)
Creatinine, Ser: 1.32 mg/dL (ref 0.40–1.50)
GFR: 60.37 mL/min (ref >60.00)
Glucose, Bld: 113 mg/dL — ABNORMAL HIGH (ref 70–99)
POTASSIUM: 3.9 meq/L (ref 3.5–5.1)
SODIUM: 133 meq/L — AB (ref 135–145)

## 2018-01-19 MED ORDER — LOSARTAN POTASSIUM-HCTZ 50-12.5 MG PO TABS: 1.0000 | ORAL_TABLET | Freq: Every day | ORAL | 1 refills | Status: DC

## 2018-01-19 MED ORDER — ICOSAPENT ETHYL 1 G PO CAPS: 2.0000 g | ORAL_CAPSULE | Freq: Two times a day (BID) | ORAL | 1 refills | Status: DC

## 2018-01-19 MED ORDER — ALPRAZOLAM 0.5 MG PO TABS: 0.5000 mg | ORAL_TABLET | Freq: Two times a day (BID) | ORAL | 0 refills | Status: DC | PRN

## 2018-01-19 NOTE — Patient Instructions (Addendum)
Please reduce your losartan- HCTZ to 1/2 tablet daily. Please try to check your blood pressure once daily or at least a few times a week, at the same time each day, and keep a log. Please return in about 2 weeks so that I can see how you are doing.  Reduce your sodium intake.  Please start taking your zoloft once daily for your anxiety.  Please start taking vascepa 2grams twice daily WITH FOOD- for your cholesterol  Mediterranean Diet A Mediterranean diet refers to food and lifestyle choices that are based on the traditions of countries located on the [employer]. This way of eating has been shown to help prevent certain conditions and improve outcomes for people who have chronic diseases, like kidney disease and heart disease. What are tips for following this plan? Lifestyle  Cook and eat meals together with your family, when possible.  Drink enough fluid to keep your urine clear or pale yellow.  Be physically active every day. This includes: ? Aerobic exercise like running or swimming. ? Leisure activities like gardening, walking, or housework.  Get 7-8 hours of sleep each night.  If recommended by your health care provider, drink red wine in moderation. This means 1 glass a day for nonpregnant women and 2 glasses a day for men. A glass of wine equals 5 oz (150 mL). Reading food labels  Check the serving size of packaged foods. For foods such as rice and pasta, the serving size refers to the amount of cooked product, not dry.  Check the total fat in packaged foods. Avoid foods that have saturated fat or trans fats.  Check the ingredients list for added sugars, such as corn syrup. Shopping  At the grocery store, buy most of your food from the areas near the walls of the store. This includes: ? Fresh fruits and vegetables (produce). ? Grains, beans, nuts, and seeds. Some of these may be available in unpackaged forms or large amounts (in bulk). ? Fresh seafood. ? Poultry  and eggs. ? Low-fat dairy products.  Buy whole ingredients instead of prepackaged foods.  Buy fresh fruits and vegetables in-season from local farmers markets.  Buy frozen fruits and vegetables in resealable bags.  If you do not have access to quality fresh seafood, buy precooked frozen shrimp or canned fish, such as tuna, salmon, or sardines.  Buy small amounts of raw or cooked vegetables, salads, or olives from the deli or salad bar at your store.  Stock your pantry so you always have certain foods on hand, such as olive oil, canned tuna, canned tomatoes, rice, pasta, and beans. Cooking  Cook foods with extra-virgin olive oil instead of using butter or other vegetable oils.  Have meat as a side dish, and have vegetables or grains as your main dish. This means having meat in small portions or adding small amounts of meat to foods like pasta or stew.  Use beans or vegetables instead of meat in common dishes like chili or lasagna.  Experiment with different cooking methods. Try roasting or broiling vegetables instead of steaming or sauteing them.  Add frozen vegetables to soups, stews, pasta, or rice.  Add nuts or seeds for added healthy fat at each meal. You can add these to yogurt, salads, or vegetable dishes.  Marinate fish or vegetables using olive oil, lemon juice, garlic, and fresh herbs. Meal planning  Plan to eat 1 vegetarian meal one day each week. Try to work up to 2 vegetarian meals, if possible.  Eat seafood 2 or more times a week.  Have healthy snacks readily available, such as: ? Vegetable sticks with hummus. ? [place] yogurt. ? Fruit and nut trail mix.  Eat balanced meals throughout the week. This includes: ? Fruit: 2-3 servings a day ? Vegetables: 4-5 servings a day ? Low-fat dairy: 2 servings a day ? Fish, poultry, or lean meat: 1 serving a day ? Beans and legumes: 2 or more servings a week ? Nuts and seeds: 1-2 servings a day ? Whole grains: 6-8 servings  a day ? Extra-virgin olive oil: 3-4 servings a day  Limit red meat and sweets to only a few servings a month What are my food choices?  Mediterranean diet ? Recommended ? Grains: Whole-grain pasta. Brown rice. Bulgar wheat. Polenta. Couscous. Whole-wheat bread. Orpah Cobb. ? Vegetables: Artichokes. Beets. Broccoli. Cabbage. Carrots. Eggplant. Green beans. Chard. Kale. Spinach. Onions. Leeks. Peas. Squash. Tomatoes. Peppers. Radishes. ? Fruits: Apples. Apricots. Avocado. Berries. Bananas. Cherries. Dates. Figs. Grapes. Lemons. Melon. Oranges. Peaches. Plums. Pomegranate. ? Meats and other protein foods: Beans. Almonds. Sunflower seeds. Pine nuts. Peanuts. Cod. Salmon. Scallops. Shrimp. Tuna. Tilapia. Clams. Oysters. Eggs. ? Dairy: Low-fat milk. Cheese. Greek yogurt. ? Beverages: Water. Red wine. Herbal tea. ? Fats and oils: Extra virgin olive oil. Avocado oil. Grape seed oil. ? Sweets and desserts: [place] yogurt with honey. Baked apples. Poached pears. Trail mix. ? Seasoning and other foods: Basil. Cilantro. Coriander. Cumin. Mint. Parsley. Sage. Rosemary. Tarragon. Garlic. Oregano. Thyme. Pepper. Balsalmic vinegar. Tahini. Hummus. Tomato sauce. Olives. Mushrooms. ? Limit these ? Grains: Prepackaged pasta or rice dishes. Prepackaged cereal with added sugar. ? Vegetables: Deep fried potatoes (french fries). ? Fruits: Fruit canned in syrup. ? Meats and other protein foods: Beef. Pork. Lamb. Poultry with skin. Hot dogs. Tomasa Blase. ? Dairy: Ice cream. Sour cream. Whole milk. ? Beverages: Juice. Sugar-sweetened soft drinks. Beer. Liquor and spirits. ? Fats and oils: Butter. Canola oil. Vegetable oil. Beef fat (tallow). Lard. ? Sweets and desserts: Cookies. Cakes. Pies. Candy. ? Seasoning and other foods: Mayonnaise. Premade sauces and marinades. ? The items listed may not be a complete list. Talk with your dietitian about what dietary choices are right for you. Summary  The Mediterranean diet  includes both food and lifestyle choices.  Eat a variety of fresh fruits and vegetables, beans, nuts, seeds, and whole grains.  Limit the amount of red meat and sweets that you eat.  Talk with your health care provider about whether it is safe for you to drink red wine in moderation. This means 1 glass a day for nonpregnant women and 2 glasses a day for men. A glass of wine equals 5 oz (150 mL). This information is not intended to replace advice given to you by your health care provider. Make sure you discuss any questions you have with your health care provider. Document Released: 04/18/2016 Document Revised: 05/21/2016 Document Reviewed: 04/18/2016 Elsevier Interactive Patient Education  [employer].

## 2018-01-19 NOTE — Assessment & Plan Note (Signed)
Reported symptomatic hypotension at home despite stopping 2 blood pressure medications. Will reduce dosage of losartan HCTZ in half  Instructed to monitor sodium intake and stay hydrated Continue to monitor BP at home and RTC in 2 weeks for F/u with home readings Return precautions and need for close follow up until he is stable discussed at OV today Instructed to go to lab for updated BMET today, he has not followed for labs as instructed on last visits - Basic metabolic panel; Future - losartan-hydrochlorothiazide (HYZAAR) 50-12.5 MG tablet; Take 1 tablet by mouth daily.  Dispense: 30 tablet; Refill: 1

## 2018-01-19 NOTE — Progress Notes (Signed)
Name: Daniel Butler   MRN: [id number]    DOB: 03-19-1966   Date:01/19/2018       Progress Note  Subjective  Chief Complaint  Chief Complaint  Patient presents with  . Follow-up    Blood pressure    HPI  [name] is coming in for a follow up of HTN and anxiety today. He was instructed to return in 1 month after his last OV on 1/24 for follow up but was not able to return until today. We will also F/U on hyperlipdemia.  Hypertension -he had been maintained on maintained on amlodipine 10, losartan-hctz 100-25, metoprolol 25 daily, however due to continued low blood pressure readings per his home monitor, He actually stopped all his medications except losartan- HCTZ 100-25 daily. He actually reports labile readings again today, as he did at last OV, and brought both readings and home monitor in today. His monitor initially did not calibrate with our manual cuff but on repeat reading matched office cuff. His readings are varied with some low readings 80s/60s and high readings 140s/90s. He continues to feel weak and dizzy when his readings are low, and sometimes feels flushed all over his body with the low readings. He says that once his blood pressure imprves he feels better. Denies syncope, headaches, vision changes, confusion, slurred speech, chest pain, shortness of breath, edema.  BP Readings from Last 3 Encounters:  01/19/18 128/80  10/02/17 138/80  07/24/17 130/80   Anxiety- He was prescribed zoloft in January for anxiety, after failing trial of lexapro. he admits hes only been taking the zoloft about every three days because he forgets to take it.  He has continued taking xanax BID which he feels helps his anxiety but he continues to have daily symptoms of worry He denies thoughts of harming self or others  Hyperlipidemia- Started on zetia in November for high triglycerides on cholesterol panel He was unable to tolerate zetia, it made him nauseated so he stopped taking  it  Patient Active Problem List   Diagnosis Date Noted  . Essential hypertension 10/02/2017  . Sciatica of right side 04/11/2017  . Lightheadedness 09/16/2016  . Sleep disturbance 05/19/2015  . Anxiety state 05/19/2015  . Generalized headaches 04/18/2015  . Gout 04/18/2015  . Encounter for orogastric (OG) tube placement   . Subarachnoid hemorrhage (HCC) 03/02/2015  . Acute respiratory failure with hypoxia (HCC)   . SAH (subarachnoid hemorrhage) (HCC)   . Subarachnoid bleed (HCC)   . Malignant hypertension     Past Surgical History:  Procedure Laterality Date  . CRANIOTOMY Right 03/02/2015   Procedure: CRANIOTOMY INTRACRANIAL  ANEURYSM FOR CLIPPING;  Surgeon: Lisbeth Renshaw, MD;  Location: [facility];  Service: Neurosurgery;  Laterality: Right;  . IR GENERIC HISTORICAL  02/16/2016   IR ANGIO VERTEBRAL SEL VERTEBRAL BILAT MOD SED 02/16/2016 Lisbeth Renshaw, MD [facility]  . IR GENERIC HISTORICAL  02/16/2016   IR ANGIO INTRA EXTRACRAN SEL INTERNAL CAROTID BILAT MOD SED 02/16/2016 Lisbeth Renshaw, MD [facility]  . RADIOLOGY WITH ANESTHESIA N/A 03/02/2015   Procedure: RADIOLOGY WITH ANESTHESIA;  Surgeon: Lisbeth Renshaw, MD;  Location: [facility] OR;  Service: Radiology;  Laterality: N/A;  . WISDOM TOOTH EXTRACTION      Family History  Adopted: Yes    Social History   Socioeconomic History  . Marital status: Married    Spouse name: Not on file  . Number of children: 0  . Years of education: 47  . Highest education level:  Not on file  Occupational History  . Occupation: Social research officer, government  Social Needs  . Financial resource strain: Not on file  . Food insecurity:    Worry: Not on file    Inability: Not on file  . Transportation needs:    Medical: Not on file    Non-medical: Not on file  Tobacco Use  . Smoking status: Current Every Day Smoker    Packs/day: 1.00    Years: 25.00    Pack years: 25.00    Types: Cigarettes  . Smokeless tobacco: Never Used  Substance and  Sexual Activity  . Alcohol use: Yes    Alcohol/week: 21.0 oz    Types: 35 Cans of beer per week  . Drug use: No  . Sexual activity: Not on file  Lifestyle  . Physical activity:    Days per week: Not on file    Minutes per session: Not on file  . Stress: Not on file  Relationships  . Social connections:    Talks on phone: Not on file    Gets together: Not on file    Attends religious service: Not on file    Active member of club or organization: Not on file    Attends meetings of clubs or organizations: Not on file    Relationship status: Not on file  . Intimate partner violence:    Fear of current or ex partner: Not on file    Emotionally abused: Not on file    Physically abused: Not on file    Forced sexual activity: Not on file  Other Topics Concern  . Not on file  Social History Narrative   Denies religious beliefs effecting health care.      Current Outpatient Medications:  .  allopurinol (ZYLOPRIM) 100 MG tablet, Take 1 tablet (100 mg total) by mouth daily., Disp: 90 tablet, Rfl: 1 .  sertraline (ZOLOFT) 25 MG tablet, Take 1 tablet (25 mg total) by mouth daily., Disp: 30 tablet, Rfl: 1 .  ALPRAZolam (XANAX) 0.5 MG tablet, Take 1 tablet (0.5 mg total) by mouth 2 (two) times daily as needed. for anxiety, Disp: 60 tablet, Rfl: 0 .  amLODipine (NORVASC) 10 MG tablet, TAKE ONE TABLET BY MOUTH DAILY (Patient not taking: Reported on 01/19/2018), Disp: 90 tablet, Rfl: 1 .  Icosapent Ethyl (VASCEPA) 1 g CAPS, Take 2 capsules (2 g total) by mouth 2 (two) times daily., Disp: 60 capsule, Rfl: 1 .  losartan-hydrochlorothiazide (HYZAAR) 50-12.5 MG tablet, Take 1 tablet by mouth daily., Disp: 30 tablet, Rfl: 1 .  metoprolol succinate (TOPROL-XL) 25 MG 24 hr tablet, TAKE ONE TABLET BY MOUTH ONCE DAILY (Patient not taking: Reported on 01/19/2018), Disp: 90 tablet, Rfl: 1  Allergies  Allergen Reactions  . Zetia [Ezetimibe] Nausea And Vomiting     ROS See HPI  Objective  Vitals:    01/19/18 1355 01/19/18 1604  BP: 128/80   Pulse: (!) 106 96  Resp: 16   Temp: 98.6 F (37 C)   TempSrc: Oral   SpO2: 97%   Weight: 235 lb (106.6 kg)   Height: 6\' 1"  (1.854 m)     Body mass index is 31 kg/m.  Physical Exam Vital signs reviewed Constitutional: Patient appears well-developed and well-nourished. No distress.  HENT: Head: Normocephalic and atraumatic. Eyes: Conjunctivaeare normal. No scleral icterus.  Neck: Normal range of motion. Neck supple. Cardiovascular: Normal rate, regular rhythm and normal heart sounds. No BLE edema. Distal pulses intact. Pulmonary/Chest: Effort normal and  breath sounds normal. No respiratory distress. Abdominal: Soft, no distension. Musculoskeletal: Normal range of motion, No gross deformities Neurological: He is alert and oriented to person, place, and time. Coordination, balance, strength, speech and gait are normal.  Skin: Skin is warm and dry.  Psychiatric: Patient has a normal mood and affect.  Assessment & Plan RTC in 2 weeks for F/U: HTN- reducing losartan-HCTZ dosage; Anxiety-starting zoloft daily; HLD-starting vascepa

## 2018-01-19 NOTE — Assessment & Plan Note (Signed)
Discussed the role of healthy diet and exercise in the management of hyperlipidemia and printed information on AVS Will try vascepa for reduction of triglycerides- dosing and side effects discussed RTC in 2 weeks for F/U - Icosapent Ethyl (VASCEPA) 1 g CAPS; Take 2 capsules (2 g total) by mouth 2 (two) times daily.  Dispense: 60 capsule; Refill: 1

## 2018-01-19 NOTE — Assessment & Plan Note (Signed)
We again discussed the use of xanax only as needed and infrequently as possible due to the long term risks associated with benzodiazepines including addiction. He is wanting to try the zoloft daily now, he says hell give himself a daily reminder to take the medicaiton Xanax refill today-controlled substance registry reviewed with no irregularities. UDS up to date. RTC in 2 weeks for F/U - ALPRAZolam (XANAX) 0.5 MG tablet; Take 1 tablet (0.5 mg total) by mouth 2 (two) times daily as needed. for anxiety  Dispense: 60 tablet; Refill: 0

## 2018-01-20 ENCOUNTER — Encounter: Payer: Self-pay | Admitting: Nurse Practitioner

## 2018-01-23 ENCOUNTER — Encounter: Payer: Self-pay | Admitting: Nurse Practitioner

## 2018-02-13 ENCOUNTER — Encounter: Payer: Self-pay | Admitting: Nurse Practitioner

## 2018-02-13 ENCOUNTER — Ambulatory Visit (INDEPENDENT_AMBULATORY_CARE_PROVIDER_SITE_OTHER): Payer: Self-pay | Admitting: Nurse Practitioner

## 2018-02-13 VITALS — BP 130/80 | HR 100 | Temp 98.3°F | Ht 73.0 in | Wt 238.5 lb

## 2018-02-13 DIAGNOSIS — I10 Essential (primary) hypertension: Secondary | ICD-10-CM

## 2018-02-13 DIAGNOSIS — F411 Generalized anxiety disorder: Secondary | ICD-10-CM

## 2018-02-13 DIAGNOSIS — E782 Mixed hyperlipidemia: Secondary | ICD-10-CM

## 2018-02-13 DIAGNOSIS — M1000 Idiopathic gout, unspecified site: Secondary | ICD-10-CM

## 2018-02-13 DIAGNOSIS — Z23 Encounter for immunization: Secondary | ICD-10-CM

## 2018-02-13 MED ORDER — VENLAFAXINE HCL ER 75 MG PO CP24: 75.0000 mg | ORAL_CAPSULE | Freq: Every day | ORAL | 1 refills | Status: DC

## 2018-02-13 MED ORDER — ALPRAZOLAM 0.5 MG PO TABS: 0.5000 mg | ORAL_TABLET | Freq: Two times a day (BID) | ORAL | 0 refills | Status: DC | PRN

## 2018-02-13 MED ORDER — ALLOPURINOL 100 MG PO TABS: 100.0000 mg | ORAL_TABLET | Freq: Every day | ORAL | 1 refills | Status: DC

## 2018-02-13 MED ORDER — LOSARTAN POTASSIUM 50 MG PO TABS: 50.0000 mg | ORAL_TABLET | Freq: Every day | ORAL | 1 refills | Status: DC

## 2018-02-13 NOTE — Patient Instructions (Addendum)
Please return to the lab downstairs for labwork when fasting- only water or black coffee for 8 hours prior  Please STOP your losartan-hydrochlorothiazide and START losartan 50mg  once daily for your blood pressure.   I have sent a prescription for effexor 75mg  tablets to your pharmacy. Please start 1/2 tablet once daily for 1 week and then increase to a full tablet once daily on week two as tolerated.  Some side effects such as nausea, drowsiness and weight gain can occur.  Also rarely people have experienced suicidal thoughts when taking this medication.  Please discontinue the medication and go directly to ED if this occurs.    Id like to see you back in about 1 month to evaluate progress.

## 2018-02-13 NOTE — Progress Notes (Signed)
Name: Daniel Butler   MRN: [id number]    DOB: 10-16-1965   Date:02/13/2018       Progress Note  Subjective  Chief Complaint  Follow up  HPI He is here today for follow up of HTN, anxiety and depression and hyperlipidemia. He is also requesting allopurinol refill today.  Hypertension -losartan-HCTZ dosage was reduced at last OV on 5/13 due to symptomatic hypotension. He Reports he reduced the dosage as instructed, home readings remain around 100s/70s since reducing the dosage. He says his feelings of dizziness and flushing have improved but does continue to experience some feeling of dizziness and flushing when BP readings are lower Denies syncope, headaches, vision changes, chest pain, shortness of breath, edema.  BP Readings from Last 3 Encounters:  02/13/18 130/80  01/19/18 128/80  10/02/17 138/80   Cholesterol- started on vascepa at last OV but he says it made him have severe diarrhea so he stopped taking it He has been unable to tolerate statins in the past.  Lab Results  Component Value Date   CHOL 194 07/24/2017   HDL 37.00 (L) 07/24/2017   LDLDIRECT 97.0 07/24/2017   TRIG (H) 07/24/2017    475.0 Triglyceride is over 400; calculations on Lipids are invalid.   CHOLHDL 5 07/24/2017    Anxiety and depression-zoloft was increased to daily at last OV, he had only been taking the medication every other day. He was not able to tolerate the zoloft, made him feel "droswy and heavy."  He says he has continued xanax BID He denies thoughts of harming himself or others.  Gout-  Maintained on allopurinol 100 daily for some time now Reports daily medication compliance without noted adverse effects Denies recent gout flare.   Patient Active Problem List   Diagnosis Date Noted  . Hyperlipidemia 01/19/2018  . Essential hypertension 10/02/2017  . Sciatica of right side 04/11/2017  . Sleep disturbance 05/19/2015  . Anxiety state 05/19/2015  . Generalized headaches 04/18/2015   . Gout 04/18/2015  . Encounter for orogastric (OG) tube placement   . Subarachnoid hemorrhage (HCC) 03/02/2015  . Acute respiratory failure with hypoxia (HCC)   . SAH (subarachnoid hemorrhage) (HCC)   . Subarachnoid bleed (HCC)   . Malignant hypertension     Past Surgical History:  Procedure Laterality Date  . CRANIOTOMY Right 03/02/2015   Procedure: CRANIOTOMY INTRACRANIAL  ANEURYSM FOR CLIPPING;  Surgeon: Lisbeth Renshaw, MD;  Location: [facility];  Service: Neurosurgery;  Laterality: Right;  . IR GENERIC HISTORICAL  02/16/2016   IR ANGIO VERTEBRAL SEL VERTEBRAL BILAT MOD SED 02/16/2016 Lisbeth Renshaw, MD [facility]  . IR GENERIC HISTORICAL  02/16/2016   IR ANGIO INTRA EXTRACRAN SEL INTERNAL CAROTID BILAT MOD SED 02/16/2016 Lisbeth Renshaw, MD [facility]  . RADIOLOGY WITH ANESTHESIA N/A 03/02/2015   Procedure: RADIOLOGY WITH ANESTHESIA;  Surgeon: Lisbeth Renshaw, MD;  Location: [facility] OR;  Service: Radiology;  Laterality: N/A;  . WISDOM TOOTH EXTRACTION      Family History  Adopted: Yes    Social History   Socioeconomic History  . Marital status: Married    Spouse name: Not on file  . Number of children: 0  . Years of education: 84  . Highest education level: Not on file  Occupational History  . Occupation: Social research officer, government  Social Needs  . Financial resource strain: Not on file  . Food insecurity:    Worry: Not on file    Inability: Not on file  . Transportation needs:  Medical: Not on file    Non-medical: Not on file  Tobacco Use  . Smoking status: Current Every Day Smoker    Packs/day: 1.00    Years: 25.00    Pack years: 25.00    Types: Cigarettes  . Smokeless tobacco: Never Used  Substance and Sexual Activity  . Alcohol use: Yes    Alcohol/week: 21.0 oz    Types: 35 Cans of beer per week  . Drug use: No  . Sexual activity: Not on file  Lifestyle  . Physical activity:    Days per week: Not on file    Minutes per session: Not on file  . Stress: Not  on file  Relationships  . Social connections:    Talks on phone: Not on file    Gets together: Not on file    Attends religious service: Not on file    Active member of club or organization: Not on file    Attends meetings of clubs or organizations: Not on file    Relationship status: Not on file  . Intimate partner violence:    Fear of current or ex partner: Not on file    Emotionally abused: Not on file    Physically abused: Not on file    Forced sexual activity: Not on file  Other Topics Concern  . Not on file  Social History Narrative   Denies religious beliefs effecting health care.      Current Outpatient Medications:  .  allopurinol (ZYLOPRIM) 100 MG tablet, Take 1 tablet (100 mg total) by mouth daily., Disp: 90 tablet, Rfl: 1 .  ALPRAZolam (XANAX) 0.5 MG tablet, Take 1 tablet (0.5 mg total) by mouth 2 (two) times daily as needed. for anxiety, Disp: 60 tablet, Rfl: 0 .  losartan-hydrochlorothiazide (HYZAAR) 50-12.5 MG tablet, Take 1 tablet by mouth daily., Disp: 30 tablet, Rfl: 1 .  amLODipine (NORVASC) 10 MG tablet, TAKE ONE TABLET BY MOUTH DAILY (Patient not taking: Reported on 01/19/2018), Disp: 90 tablet, Rfl: 1 .  Icosapent Ethyl (VASCEPA) 1 g CAPS, Take 2 capsules (2 g total) by mouth 2 (two) times daily. (Patient not taking: Reported on 02/13/2018), Disp: 60 capsule, Rfl: 1 .  metoprolol succinate (TOPROL-XL) 25 MG 24 hr tablet, TAKE ONE TABLET BY MOUTH ONCE DAILY (Patient not taking: Reported on 01/19/2018), Disp: 90 tablet, Rfl: 1 .  sertraline (ZOLOFT) 25 MG tablet, Take 1 tablet (25 mg total) by mouth daily. (Patient not taking: Reported on 02/13/2018), Disp: 30 tablet, Rfl: 1  Allergies  Allergen Reactions  . Zetia [Ezetimibe] Nausea And Vomiting     ROS See HPI  Objective  Vitals:   02/13/18 1434  BP: 130/80  Pulse: 100  Temp: 98.3 F (36.8 C)  TempSrc: Oral  SpO2: 97%  Weight: 238 lb 8 oz (108.2 kg)  Height: 6\' 1"  (1.854 m)    Body mass index is  31.47 kg/m.  Physical Exam Vital signs reviewed Constitutional: Patient appears well-developed and well-nourished. No distress.  HENT: Head: Normocephalic and atraumatic. Eyes: Conjunctivae and EOM are normal. Pupils are equal, round, and reactive to light. No scleral icterus Neck: Normal range of motion. Neck supple. Cardiovascular: Normal rate, regular rhythm and normal heart sounds. No BLE edema. Distal pulses intact. Pulmonary/Chest: Effort normal and breath sounds normal. No respiratory distress. Musculoskeletal: Normal range of motion. No gross deformities Neurological: He is alert and oriented to person, place, and time. No cranial nerve deficit. Coordination, balance, strength, speech and gait are normal.  Neurological:He is alert and oriented to person, place, and time. Coordination, balance, strength, speech and gait are normal.  Skin: Skin is warm and dry. No rash noted. No erythema. Psychiatric: Patient has a normal mood and affect.   Assessment & Plan RTC in 1 month for F/U: HTN- stopping losartan-hctz, starting losartan; Anxiety and depression- starting effexor  -Reviewed Health Maintenance:  Need for Tdap vaccination- Tdap vaccine greater than or equal to 7yo IM

## 2018-02-16 ENCOUNTER — Encounter: Payer: Self-pay | Admitting: Nurse Practitioner

## 2018-02-16 NOTE — Assessment & Plan Note (Signed)
Continue allopurinol at current dosage Update labs F/U with further recommendations pending lab results - allopurinol (ZYLOPRIM) 100 MG tablet; Take 1 tablet (100 mg total) by mouth daily.  Dispense: 90 tablet; Refill: 1 - Uric acid; Future

## 2018-02-16 NOTE — Assessment & Plan Note (Signed)
Update lipid panel F/u with further recommendations Could consider referral to lipid clinic for further management of HLD if cholesterol remains elevated especially given that he has failed multple meds - Lipid panel; Future

## 2018-02-16 NOTE — Assessment & Plan Note (Signed)
Continues to report low readings at home and experience symptoms of hypotension He did bring his monitor in for calibration at last OV We will discontinue HCTZ and continue losartan only at this time RTC in 1 month for F/U - losartan (COZAAR) 50 MG tablet; Take 1 tablet (50 mg total) by mouth daily.  Dispense: 30 tablet; Refill: 1

## 2018-02-16 NOTE — Assessment & Plan Note (Signed)
Again we discussed the long term risks associated with xanax and that safer management of anxiety/depression would be with a daily SSRI/SNRI- he was agreeable to try effexor-dosing and side effects discussed RTC for F/U in 1 month Will consider referral to psychiatry if he fails effexor - venlafaxine XR (EFFEXOR XR) 75 MG 24 hr capsule; Take 1 capsule (75 mg total) by mouth daily with breakfast.  Dispense: 30 capsule; Refill: 1 - ALPRAZolam (XANAX) 0.5 MG tablet; Take 1 tablet (0.5 mg total) by mouth 2 (two) times daily as needed. for anxiety  Dispense: 60 tablet; Refill: 0

## 2018-02-17 ENCOUNTER — Encounter: Payer: Self-pay | Admitting: Nurse Practitioner

## 2018-03-05 ENCOUNTER — Other Ambulatory Visit (INDEPENDENT_AMBULATORY_CARE_PROVIDER_SITE_OTHER): Payer: Self-pay

## 2018-03-05 DIAGNOSIS — M1000 Idiopathic gout, unspecified site: Secondary | ICD-10-CM

## 2018-03-05 DIAGNOSIS — E782 Mixed hyperlipidemia: Secondary | ICD-10-CM

## 2018-03-05 LAB — LIPID PANEL
Cholesterol: 212 mg/dL — ABNORMAL HIGH (ref 0–200)
HDL: 37 mg/dL — AB (ref >39.00)
Total CHOL/HDL Ratio: 6

## 2018-03-05 LAB — URIC ACID: Uric Acid, Serum: 5.7 mg/dL (ref 4.0–7.8)

## 2018-03-05 LAB — LDL CHOLESTEROL, DIRECT: LDL DIRECT: 103 mg/dL

## 2018-03-09 ENCOUNTER — Encounter: Payer: Self-pay | Admitting: Nurse Practitioner

## 2018-03-10 ENCOUNTER — Other Ambulatory Visit: Payer: Self-pay | Admitting: Nurse Practitioner

## 2018-03-10 DIAGNOSIS — E785 Hyperlipidemia, unspecified: Secondary | ICD-10-CM

## 2018-03-10 MED ORDER — FENOFIBRATE 145 MG PO TABS: 145.0000 mg | ORAL_TABLET | Freq: Every day | ORAL | 1 refills | Status: DC

## 2018-03-10 NOTE — Progress Notes (Signed)
tricor

## 2018-03-17 ENCOUNTER — Ambulatory Visit: Payer: Self-pay | Admitting: Nurse Practitioner

## 2018-04-12 ENCOUNTER — Other Ambulatory Visit: Payer: Self-pay | Admitting: Nurse Practitioner

## 2018-04-12 DIAGNOSIS — I10 Essential (primary) hypertension: Secondary | ICD-10-CM

## 2018-04-27 ENCOUNTER — Telehealth: Payer: Self-pay | Admitting: Nurse Practitioner

## 2018-04-27 DIAGNOSIS — F411 Generalized anxiety disorder: Secondary | ICD-10-CM

## 2018-04-27 NOTE — Telephone Encounter (Signed)
Copied from CRM [id number]. Topic: Quick Communication - Rx Refill/Question >> Apr 27, 2018  2:11 PM Maia Petties wrote: Medication: ALPRAZolam Prudy Feeler) 0.5 MG tablet, last order 02/13/18 for 02/18/18 fill date #60, last OV 02/13/18 Has the patient contacted their pharmacy? no Preferred Pharmacy (with phone number or street name):  [facility] [phone] [place], [place] - [address] AT [facility] [phone] (Phone) [phone] (Fax)

## 2018-04-28 MED ORDER — ALPRAZOLAM 0.5 MG PO TABS: 0.5000 mg | ORAL_TABLET | Freq: Two times a day (BID) | ORAL | 0 refills | Status: DC | PRN

## 2018-04-28 NOTE — Telephone Encounter (Signed)
Sent refill. thx!

## 2018-04-28 NOTE — Telephone Encounter (Signed)
Check [place] registry last filled 02/23/2018.Marland KitchenRaechel Chute

## 2018-04-29 ENCOUNTER — Ambulatory Visit: Payer: Self-pay | Admitting: Nurse Practitioner

## 2018-04-29 NOTE — Telephone Encounter (Signed)
[place] approved refill has been sent to [facility].Marland KitchenRaechel Chute

## 2018-05-01 ENCOUNTER — Other Ambulatory Visit: Payer: Self-pay | Admitting: Nurse Practitioner

## 2018-05-01 DIAGNOSIS — F411 Generalized anxiety disorder: Secondary | ICD-10-CM

## 2018-05-04 MED ORDER — ALPRAZOLAM 0.5 MG PO TABS: 0.5000 mg | ORAL_TABLET | Freq: Two times a day (BID) | ORAL | 0 refills | Status: DC | PRN

## 2018-05-04 NOTE — Telephone Encounter (Signed)
Last filled: 02/23/18 LOV w/you: 02/13/18 Next OV: 05/20/18

## 2018-05-20 ENCOUNTER — Other Ambulatory Visit (INDEPENDENT_AMBULATORY_CARE_PROVIDER_SITE_OTHER): Payer: Self-pay

## 2018-05-20 ENCOUNTER — Encounter: Payer: Self-pay | Admitting: Nurse Practitioner

## 2018-05-20 ENCOUNTER — Ambulatory Visit (INDEPENDENT_AMBULATORY_CARE_PROVIDER_SITE_OTHER): Payer: Self-pay | Admitting: Nurse Practitioner

## 2018-05-20 VITALS — BP 150/90 | HR 94 | Ht 73.0 in | Wt 241.0 lb

## 2018-05-20 DIAGNOSIS — E785 Hyperlipidemia, unspecified: Secondary | ICD-10-CM

## 2018-05-20 DIAGNOSIS — F411 Generalized anxiety disorder: Secondary | ICD-10-CM

## 2018-05-20 DIAGNOSIS — I10 Essential (primary) hypertension: Secondary | ICD-10-CM

## 2018-05-20 LAB — LIPID PANEL
Cholesterol: 228 mg/dL — ABNORMAL HIGH (ref 0–200)
HDL: 50 mg/dL (ref >39.00)
Total CHOL/HDL Ratio: 5
Triglycerides: 585 mg/dL — ABNORMAL HIGH (ref 0.0–149.0)

## 2018-05-20 LAB — LDL CHOLESTEROL, DIRECT: Direct LDL: 116 mg/dL

## 2018-05-20 MED ORDER — VENLAFAXINE HCL ER 37.5 MG PO CP24: 37.5000 mg | ORAL_CAPSULE | Freq: Every day | ORAL | 2 refills | Status: DC

## 2018-05-20 NOTE — Assessment & Plan Note (Signed)
Update lipid panel-he is fasting  He has not been consistently taking fenofibrate and we discussed the importance of daily medication compliance for optimal medication effect F/U with further recommendations pending lab results - Lipid panel; Future

## 2018-05-20 NOTE — Assessment & Plan Note (Signed)
BP reading slightly elevated today but reported normal readings at home and c/o dizziness has resolved Continue losartan at current dosage for now RTC in 2-3 months for follow up

## 2018-05-20 NOTE — Patient Instructions (Addendum)
Please head downstairs for lab work. If any of your test results are critically abnormal, you will be contacted right away. Otherwise, I will contact you within a week about your test results and follow up recommendations  Please start effexor 37.5 mg daily Please continue xanax as needed only and DO NOT MIX XANAX WITH ALCOHOL  I have placed a referral to psychiatry for anxiety. Our office will begin processing this referral. Please follow up if you have not heard anything about this referral within 10 days.  I will plan to see you back in 2-3 months, to recheck your blood pressure and see how you are doing.

## 2018-05-20 NOTE — Progress Notes (Signed)
Name: Daniel Butler   MRN: [id number]    DOB: 05/02/1966   Date:05/20/2018       Progress Note  Subjective  Chief Complaint Follow up  HPI Daniel Butler is here today for follow up of HTN, anxiety and depression, last seen for these complaints on 02/13/18, but did not return for follow up until today. We will also follow up on his elevated cholesterol, intolerant to statins and vascepa in the past, was sent a prescription for fenofibrate 145 once daily after lipid panel showed continued hyperlipidemia on 03/05/18. He says he did start the fenofibrate, seems to tolerate well and has not noted any adverse effects although he admits he does not take the medication every day because he sometimes forgets to take it.  Hypertension -HCTZ was stopped at his last OV on 6/7 due to lower home BP readings and dizziness, losartan 50 daily was continued. Reports he has adjusted medications as instructed, dizziness has resolved, he has continued to monitor BP readings at home, Reports daily readings 120s-130s/70s-80s since stopping HCTZ. Denies headaches, vision changes, chest pain, shortness of breath, edema.  BP Readings from Last 3 Encounters:  05/20/18 (!) 150/90  02/13/18 130/80  01/19/18 128/80   Anxiety and depression-maintained on xanax prn and effexor 75 daily was started at last OV due to continued anxiety. He says he started the effexor daily, but started with whole 75 mg dose rather than half dose, and although the effexor did seem to help his mood he stopped after about 1 week because the medication "made me feel out of sorts." he would like to try effexor again, but at a lower dosage if possible. He also tells me that he wonders if he has some  PTSD after his brain surgery, sometimes has flashbacks of this surgery and feels traumatized of it. He also says he continues to feel anxious about his wife leaving him, she has recently told him she may return home but he is not sure she will. He says he  has been drinking more beer since she left to try to cope, but does avoid alcohol when taking his xanax and has actually reduced his xanax to 1/2-1 tablet less than daily now, feels like he does not need to take it twice a day any longer but only using when acutely anxious. He denies thoughts of harming himself or others  Patient Active Problem List   Diagnosis Date Noted  . Hyperlipidemia 01/19/2018  . Essential hypertension 10/02/2017  . Sciatica of right side 04/11/2017  . Sleep disturbance 05/19/2015  . Anxiety state 05/19/2015  . Generalized headaches 04/18/2015  . Gout 04/18/2015  . Encounter for orogastric (OG) tube placement   . Subarachnoid hemorrhage (HCC) 03/02/2015  . Acute respiratory failure with hypoxia (HCC)   . SAH (subarachnoid hemorrhage) (HCC)   . Subarachnoid bleed (HCC)   . Malignant hypertension     Past Surgical History:  Procedure Laterality Date  . CRANIOTOMY Right 03/02/2015   Procedure: CRANIOTOMY INTRACRANIAL  ANEURYSM FOR CLIPPING;  Surgeon: Lisbeth Renshaw, MD;  Location: [facility];  Service: Neurosurgery;  Laterality: Right;  . IR GENERIC HISTORICAL  02/16/2016   IR ANGIO VERTEBRAL SEL VERTEBRAL BILAT MOD SED 02/16/2016 Lisbeth Renshaw, MD [facility]  . IR GENERIC HISTORICAL  02/16/2016   IR ANGIO INTRA EXTRACRAN SEL INTERNAL CAROTID BILAT MOD SED 02/16/2016 Lisbeth Renshaw, MD [facility]  . RADIOLOGY WITH ANESTHESIA N/A 03/02/2015   Procedure: RADIOLOGY WITH ANESTHESIA;  Surgeon: Marlane Hatcher  Conchita Paris, MD;  Location: [facility] OR;  Service: Radiology;  Laterality: N/A;  . WISDOM TOOTH EXTRACTION      Family History  Adopted: Yes    Social History   Socioeconomic History  . Marital status: Married    Spouse name: Not on file  . Number of children: 0  . Years of education: 50  . Highest education level: Not on file  Occupational History  . Occupation: Social research officer, government  Social Needs  . Financial resource strain: Not on file  . Food insecurity:     Worry: Not on file    Inability: Not on file  . Transportation needs:    Medical: Not on file    Non-medical: Not on file  Tobacco Use  . Smoking status: Current Every Day Smoker    Packs/day: 1.00    Years: 25.00    Pack years: 25.00    Types: Cigarettes  . Smokeless tobacco: Never Used  Substance and Sexual Activity  . Alcohol use: Yes    Alcohol/week: 35.0 standard drinks    Types: 35 Cans of beer per week  . Drug use: No  . Sexual activity: Not on file  Lifestyle  . Physical activity:    Days per week: Not on file    Minutes per session: Not on file  . Stress: Not on file  Relationships  . Social connections:    Talks on phone: Not on file    Gets together: Not on file    Attends religious service: Not on file    Active member of club or organization: Not on file    Attends meetings of clubs or organizations: Not on file    Relationship status: Not on file  . Intimate partner violence:    Fear of current or ex partner: Not on file    Emotionally abused: Not on file    Physically abused: Not on file    Forced sexual activity: Not on file  Other Topics Concern  . Not on file  Social History Narrative   Denies religious beliefs effecting health care.      Current Outpatient Medications:  .  allopurinol (ZYLOPRIM) 100 MG tablet, Take 1 tablet (100 mg total) by mouth daily., Disp: 90 tablet, Rfl: 1 .  ALPRAZolam (XANAX) 0.5 MG tablet, Take 1 tablet (0.5 mg total) by mouth 2 (two) times daily as needed. for anxiety, Disp: 60 tablet, Rfl: 0 .  fenofibrate (TRICOR) 145 MG tablet, Take 1 tablet (145 mg total) by mouth daily., Disp: 30 tablet, Rfl: 1 .  losartan (COZAAR) 50 MG tablet, TAKE 1 TABLET BY MOUTH DAILY, Disp: 90 tablet, Rfl: 1 .  venlafaxine XR (EFFEXOR XR) 75 MG 24 hr capsule, Take 1 capsule (75 mg total) by mouth daily with breakfast. (Patient not taking: Reported on 05/20/2018), Disp: 30 capsule, Rfl: 1  Allergies  Allergen Reactions  . Vascepa [Epa Ethyl  Ester] Diarrhea  . Zetia [Ezetimibe] Nausea And Vomiting     ROS See HPI  Objective  Vitals:   05/20/18 1451  BP: (!) 150/90  Pulse: 94  SpO2: 98%  Weight: 241 lb (109.3 kg)  Height: 6\' 1"  (1.854 m)    Body mass index is 31.8 kg/m.  Physical Exam Vital signs reviewed Constitutional: Patient appears well-developed and well-nourished. No distress.  HENT: Head: Normocephalic and atraumatic. Eyes: Conjunctivae and EOM are normal. Pupils are equal, round, and reactive to light. No scleral icterus Neck: Normal range of motion. Neck supple. Cardiovascular:  Normal rate, regular rhythm and normal heart sounds. No BLE edema. Distal pulses intact. Pulmonary/Chest: Effort normal and breath sounds normal. No respiratory distress. Neurological: He is alert and oriented to person, place, and time. No cranial nerve deficit. Coordination, balance, strength, speech and gait are normal.  Neurological:He is alert and oriented to person, place, and time. Coordination, balance, strength, speech and gait are normal.  Skin: Skin is warm and dry. No rash noted. No erythema. Psychiatric: Patient has a normalmood and affect.   Assessment & Plan RTC in 2-3 months for F/U: HTN-recheck BP; Anxiety- check response to effexor, ensure psychiatry follow up; CPE if stable

## 2018-05-20 NOTE — Assessment & Plan Note (Addendum)
Due to continued anxiety, We discussed options of referral to psychology for counseling vs psychiatry and he would prefer to see psychiatry Will continue xanax prn, instructed not to combine with alcohol Will start effexor back at low dose-dosing and side effects discussed RTC in 2-3 months for follow up - venlafaxine XR (EFFEXOR-XR) 37.5 MG 24 hr capsule; Take 1 capsule (37.5 mg total) by mouth daily with breakfast.  Dispense: 30 capsule; Refill: 2 - Ambulatory referral to Psychiatry

## 2018-05-25 ENCOUNTER — Other Ambulatory Visit: Payer: Self-pay | Admitting: Family

## 2018-05-25 DIAGNOSIS — E782 Mixed hyperlipidemia: Secondary | ICD-10-CM

## 2018-06-02 ENCOUNTER — Encounter: Payer: Self-pay | Admitting: Internal Medicine

## 2018-06-02 ENCOUNTER — Ambulatory Visit (INDEPENDENT_AMBULATORY_CARE_PROVIDER_SITE_OTHER): Payer: Self-pay | Admitting: Internal Medicine

## 2018-06-02 VITALS — BP 140/88 | HR 88 | Ht 73.0 in | Wt 246.2 lb

## 2018-06-02 DIAGNOSIS — F1010 Alcohol abuse, uncomplicated: Secondary | ICD-10-CM

## 2018-06-02 DIAGNOSIS — Z720 Tobacco use: Secondary | ICD-10-CM

## 2018-06-02 DIAGNOSIS — E781 Pure hyperglyceridemia: Secondary | ICD-10-CM

## 2018-06-02 MED ORDER — ICOSAPENT ETHYL 1 G PO CAPS: 2.0000 g | ORAL_CAPSULE | Freq: Two times a day (BID) | ORAL | 5 refills | Status: DC

## 2018-06-02 NOTE — Patient Instructions (Signed)
Your physician has recommended you make the following change in your medication:  -- RESUME vascepa 2 gram (2 capsules) by mouth twice daily  Your physician recommends that you return for lab work FASTING in 3 months (prior to next visit with [name])  Your physician recommends that you schedule a follow-up appointment in 3 months with Dr. Fortunato Curling of [facility] [address] [place], [place] [address] [phone]

## 2018-06-02 NOTE — Progress Notes (Signed)
OFFICE CONSULT NOTE  Chief Complaint:  High triglycerides  Primary Care Physician: Evaristo Bury, NP  HPI:  Daniel Butler is a 52 y.o. male who is being seen today for the evaluation of high triglycerides at the request of Carley Hammed, NP. This is a pleasant 52 year old male was kindly referred for evaluation of high triglycerides.  He has a long history of elevated cholesterol and primarily elevated triglycerides.  Unfortunately suffered a brain aneurysm several years ago.  He did survive however he struggled with depression and anxiety.  He tells me he has not worked since then but fortunately he says just prior to that he claims he won the lottery and says that he still has over $1 million.  Recently he said his wife left him because of heavy alcohol use.  He had previously drank more than 30 beers a week and typically drinks 6-12 beers per night.  Generally eats very little and seems to have poor nutrition.  He also seems to be somewhat unkempt today in the office.  He reports significant symptoms concerning for PTSD and shies away from driving or getting out of his house much.  He did say that his wife would be returning next month and he said that may help him "get on track".  Any chest pain or worsening shortness of breath.  He has no history of known coronary artery disease.  He reports being adopted and does not know his family history.  PMHx:  Past Medical History:  Diagnosis Date  . Brain aneurysm   . Chicken pox   . Depression   . Gout   . High cholesterol   . Hypertension     Past Surgical History:  Procedure Laterality Date  . CRANIOTOMY Right 03/02/2015   Procedure: CRANIOTOMY INTRACRANIAL  ANEURYSM FOR CLIPPING;  Surgeon: Lisbeth Renshaw, MD;  Location: [facility];  Service: Neurosurgery;  Laterality: Right;  . IR GENERIC HISTORICAL  02/16/2016   IR ANGIO VERTEBRAL SEL VERTEBRAL BILAT MOD SED 02/16/2016 Lisbeth Renshaw, MD [facility]  . IR  GENERIC HISTORICAL  02/16/2016   IR ANGIO INTRA EXTRACRAN SEL INTERNAL CAROTID BILAT MOD SED 02/16/2016 Lisbeth Renshaw, MD [facility]  . RADIOLOGY WITH ANESTHESIA N/A 03/02/2015   Procedure: RADIOLOGY WITH ANESTHESIA;  Surgeon: Lisbeth Renshaw, MD;  Location: [facility] OR;  Service: Radiology;  Laterality: N/A;  . WISDOM TOOTH EXTRACTION      FAMHx:  Family History  Adopted: Yes    SOCHx:   reports that he has been smoking cigarettes. He has a 25.00 pack-year smoking history. He has never used smokeless tobacco. He reports that he drinks about 35.0 standard drinks of alcohol per week. He reports that he does not use drugs.  ALLERGIES:  Allergies  Allergen Reactions  . Vascepa [Epa Ethyl Ester] Diarrhea  . Zetia [Ezetimibe] Nausea And Vomiting    ROS: Pertinent items noted in HPI and remainder of comprehensive ROS otherwise negative.  HOME MEDS: Current Outpatient Medications on File Prior to Visit  Medication Sig Dispense Refill  . allopurinol (ZYLOPRIM) 100 MG tablet Take 1 tablet (100 mg total) by mouth daily. 90 tablet 1  . ALPRAZolam (XANAX) 0.5 MG tablet Take 1 tablet (0.5 mg total) by mouth 2 (two) times daily as needed. for anxiety 60 tablet 0  . fenofibrate (TRICOR) 145 MG tablet Take 1 tablet (145 mg total) by mouth daily. 30 tablet 1  . losartan (COZAAR) 50 MG tablet TAKE 1 TABLET BY MOUTH  DAILY 90 tablet 1  . venlafaxine XR (EFFEXOR-XR) 37.5 MG 24 hr capsule Take 1 capsule (37.5 mg total) by mouth daily with breakfast. 30 capsule 2   No current facility-administered medications on file prior to visit.     LABS/IMAGING: No results found for this or any previous visit (from the past 48 hour(s)). No results found.  LIPID PANEL:    Component Value Date/Time   CHOL 228 (H) 05/20/2018 1557   TRIG (H) 05/20/2018 1557    585.0 Triglyceride is over 400; calculations on Lipids are invalid.   HDL 50.00 05/20/2018 1557   CHOLHDL 5 05/20/2018 1557   LDLDIRECT 116.0  05/20/2018 1557    WEIGHTS: Wt Readings from Last 3 Encounters:  06/02/18 246 lb 3.2 oz (111.7 kg)  05/20/18 241 lb (109.3 kg)  02/13/18 238 lb 8 oz (108.2 kg)    VITALS: BP 140/88   Pulse 88   Ht 6\' 1"  (1.854 m)   Wt 246 lb 3.2 oz (111.7 kg)   BMI 32.48 kg/m   EXAM: General appearance: alert, no distress and unkempt appearin Neck: no carotid bruit, no JVD and thyroid not enlarged, symmetric, no tenderness/mass/nodules Lungs: diminished breath sounds bilaterally Heart: regular rate and rhythm Abdomen: soft, non-tender; bowel sounds normal; no masses,  no organomegaly Extremities: edema 1+ sockline edema, long nails, onychomycosis Pulses: 2+ and symmetric Skin: multiple excoriations, cigarette stains on his hands Neurologic: Grossly normal Psych: Pleasant  EKG: Deferred  ASSESSMENT: 1. Secondary hypertriglyceridemia 2. Heavy etoh abuse 3. Tobacco abuse 4. Anxiety/depression - ?PTSD  PLAN: 1.   Mr. Gallucci has heavy alcohol abuse which is primarily driving his triglycerides to be elevated.  I suspect if he were able to be abstinent from alcohol his numbers were improved significantly.  He does report compliance with fenofibrate.  In May he was started on Vascepa but then said he had an episode of severe diarrhea.  Is unclear if this was related to the Vascepa or not.  He discontinued the medication and has not restarted it.  I think is worthwhile to retrial this.  Although it is possible he could have had side effects from this, there is additional benefit from using it.  We will provide him with some samples today and if it is tolerated then I would recommend continuing that with the fenofibrate with a plan to recheck lipids and a direct LDL in 3 months.  In addition we provided information's for [facility] in [place].  I have encouraged him to reach out to them and to become involved in the program.  He does seem like he understands that  his alcohol use has become a problem.  Thanks for the kind referral.  Chrystie Nose, MD, [facility]  [place]  [facility]  Medical Director of the [facility]  [facility]iovascular Risk Reduction [facility] Diplomate of the American Board of Clinical Lipidology Attending Cardiologist  Direct Dial: [phone]  Fax: [phone]  Website:  [email or web address] 06/02/2018, 8:38 AM

## 2018-07-01 ENCOUNTER — Other Ambulatory Visit: Payer: Self-pay | Admitting: Nurse Practitioner

## 2018-07-01 DIAGNOSIS — F411 Generalized anxiety disorder: Secondary | ICD-10-CM

## 2018-07-01 NOTE — Telephone Encounter (Signed)
[place] Controlled Database Checked Last filled: 05/31/18 # 60 LOV w/PCP: 05/20/18 Next appt w/PCP: 07/22/18

## 2018-07-22 ENCOUNTER — Ambulatory Visit: Payer: Self-pay | Admitting: Nurse Practitioner

## 2018-07-29 ENCOUNTER — Encounter: Payer: Self-pay | Admitting: Nurse Practitioner

## 2018-07-29 ENCOUNTER — Ambulatory Visit (INDEPENDENT_AMBULATORY_CARE_PROVIDER_SITE_OTHER): Payer: Self-pay | Admitting: Nurse Practitioner

## 2018-07-29 VITALS — BP 170/98 | HR 94 | Ht 73.0 in | Wt 249.0 lb

## 2018-07-29 DIAGNOSIS — I10 Essential (primary) hypertension: Secondary | ICD-10-CM

## 2018-07-29 DIAGNOSIS — F411 Generalized anxiety disorder: Secondary | ICD-10-CM

## 2018-07-29 MED ORDER — ESCITALOPRAM OXALATE 10 MG PO TABS: 10.0000 mg | ORAL_TABLET | Freq: Every day | ORAL | 1 refills | Status: DC

## 2018-07-29 MED ORDER — ALPRAZOLAM 0.5 MG PO TABS: 0.5000 mg | ORAL_TABLET | Freq: Two times a day (BID) | ORAL | 0 refills | Status: DC | PRN

## 2018-07-29 NOTE — Patient Instructions (Signed)
Stop effexor, START lexapro Call for psychiatry appointment.  Please try to check your blood pressure once daily or at least a few times a week, at the same time each day, and keep a log. Follow up for readings over 140/90  I will see you back in about 3 months, or sooner if needed.

## 2018-07-29 NOTE — Assessment & Plan Note (Signed)
Stop effexor, start lexapro Continue xanax prn, Controlled substance registry reviewed with no irregularities. We discussed need for psychiatry evaluation due to uncontrolled symptoms, no improvement with several medication adjustments, he is agreeable, he was provided handout of local psychiatrists to schedule appointment - ALPRAZolam (XANAX) 0.5 MG tablet; Take 1 tablet (0.5 mg total) by mouth 2 (two) times daily as needed. for anxiety  Dispense: 60 tablet; Refill: 0 - escitalopram (LEXAPRO) 10 MG tablet; Take 1 tablet (10 mg total) by mouth daily.  Dispense: 30 tablet; Refill: 1

## 2018-07-29 NOTE — Progress Notes (Signed)
Daniel Butler is a 52 y.o. male with the following history as recorded in EpicCare:  Patient Active Problem List   Diagnosis Date Noted  . Alcohol abuse 06/02/2018  . Tobacco abuse 06/02/2018  . Hypertriglyceridemia 01/19/2018  . Essential hypertension 10/02/2017  . Sciatica of right side 04/11/2017  . Sleep disturbance 05/19/2015  . Anxiety state 05/19/2015  . Generalized headaches 04/18/2015  . Gout 04/18/2015  . Encounter for orogastric (OG) tube placement   . Subarachnoid hemorrhage (HCC) 03/02/2015  . Acute respiratory failure with hypoxia (HCC)   . SAH (subarachnoid hemorrhage) (HCC)   . Subarachnoid bleed (HCC)   . Malignant hypertension     Current Outpatient Medications  Medication Sig Dispense Refill  . allopurinol (ZYLOPRIM) 100 MG tablet Take 1 tablet (100 mg total) by mouth daily. 90 tablet 1  . ALPRAZolam (XANAX) 0.5 MG tablet Take 1 tablet (0.5 mg total) by mouth 2 (two) times daily as needed. for anxiety 60 tablet 0  . fenofibrate (TRICOR) 145 MG tablet Take 1 tablet (145 mg total) by mouth daily. 30 tablet 1  . Icosapent Ethyl 1 g CAPS Take 2 capsules (2 g total) by mouth 2 (two) times daily. 120 capsule 5  . losartan (COZAAR) 50 MG tablet TAKE 1 TABLET BY MOUTH DAILY 90 tablet 1  . escitalopram (LEXAPRO) 10 MG tablet Take 1 tablet (10 mg total) by mouth daily. 30 tablet 1   No current facility-administered medications for this visit.     Allergies: Vascepa [epa ethyl ester] and Zetia [ezetimibe]  Past Medical History:  Diagnosis Date  . Brain aneurysm   . Chicken pox   . Depression   . Gout   . High cholesterol   . Hypertension     Past Surgical History:  Procedure Laterality Date  . CRANIOTOMY Right 03/02/2015   Procedure: CRANIOTOMY INTRACRANIAL  ANEURYSM FOR CLIPPING;  Surgeon: Lisbeth Renshaw, MD;  Location: [facility];  Service: Neurosurgery;  Laterality: Right;  . IR GENERIC HISTORICAL  02/16/2016   IR ANGIO VERTEBRAL SEL VERTEBRAL BILAT  MOD SED 02/16/2016 Lisbeth Renshaw, MD [facility]  . IR GENERIC HISTORICAL  02/16/2016   IR ANGIO INTRA EXTRACRAN SEL INTERNAL CAROTID BILAT MOD SED 02/16/2016 Lisbeth Renshaw, MD [facility]  . RADIOLOGY WITH ANESTHESIA N/A 03/02/2015   Procedure: RADIOLOGY WITH ANESTHESIA;  Surgeon: Lisbeth Renshaw, MD;  Location: [facility] OR;  Service: Radiology;  Laterality: N/A;  . WISDOM TOOTH EXTRACTION      Family History  Adopted: Yes    Social History   Tobacco Use  . Smoking status: Current Every Day Smoker    Packs/day: 1.00    Years: 25.00    Pack years: 25.00    Types: Cigarettes  . Smokeless tobacco: Never Used  Substance Use Topics  . Alcohol use: Yes    Alcohol/week: 35.0 standard drinks    Types: 35 Cans of beer per week     Subjective:  Here today for F/U of HTN, anxiety.  Hypertension -maintained on losartan 50, stopped HCTZ  this past June due to lower BP readings, his blood pressure remained stable at last OV on 05/20/18, higher today but he admits he did not take medicine today, otherwise takes daily as instructed without noted adverse effects, has blood pressure log with him today, readings at home are 120s-130s/70s-80s. Denies headaches, vision changes, chest pain, shortness of breath, edema.  BP Readings from Last 3 Encounters:  07/29/18 (!) 170/98  06/02/18 140/88  05/20/18 Marland Kitchen)  150/90   Anxiety-weve made multiple adjustments of his medications over past several visits with no real improvement in his anxiety, at last visit he asked for a lower dose of effexor so a prescription for effexor 37.5 daily was sent, tells me today he does not like the effexor, wants to go back on lexapro. A referral was made to [facility] psychiatry at last OV on 05/20/18, but appears referral was denied due to practice being full. He has also continued xanax prn, which he feels helps mood some, does not always take daily He report significant anxiety today related to wife moving back in, they had been  separated for some time, and hes not sure how things will turn out. He's been more anxious since she moved back in. No thoughts of harming himself or others  ROS- See HPI  Objective:  Vitals:   07/29/18 1355  BP: (!) 170/98  Pulse: 94  SpO2: 98%  Weight: 249 lb (112.9 kg)  Height: 6\' 1"  (1.854 m)    General: Well developed, well nourished, in no acute distress  Skin : Warm and dry.  Head: Normocephalic and atraumatic  Eyes: Sclera and conjunctiva clear; pupils round and reactive to light; extraocular movements intact  Oropharynx: Pink, supple. No suspicious lesions  Neck: Supple Lungs: Respirations unlabored; clear to auscultation bilaterally  CVS exam: normal rate and regular rhythm, S1 and S2 normal.  Extremities: No edema, cyanosis Vessels: Symmetric bilaterally  Neurologic: Alert and oriented; speech intact; face symmetrical; moves all extremities well; CNII-XII intact without focal deficit  Psychiatric: Pt appears anxious. Normal affect.  Physical Exam   Assessment:  1. Anxiety state   2. Hyperlipidemia, unspecified hyperlipidemia type   3. Essential hypertension     Plan:   Return in about 3 months (around 10/29/2018) for CPE, F/U: HTN- check BP.  No orders of the defined types were placed in this encounter.   Requested Prescriptions   Signed Prescriptions Disp Refills  . ALPRAZolam (XANAX) 0.5 MG tablet 60 tablet 0    Sig: Take 1 tablet (0.5 mg total) by mouth 2 (two) times daily as needed. for anxiety  . escitalopram (LEXAPRO) 10 MG tablet 30 tablet 1    Sig: Take 1 tablet (10 mg total) by mouth daily.

## 2018-07-29 NOTE — Assessment & Plan Note (Signed)
Elevated today with missed medication dosage Readings are normal at home, will continue losartan at current dosage Continue to monitor  RTC in 3 months for F/U, sooner for readings >140/90

## 2018-07-30 ENCOUNTER — Encounter: Payer: Self-pay | Admitting: Nurse Practitioner

## 2018-07-30 MED ORDER — ALPRAZOLAM 1 MG PO TABS: 0.5000 mg | ORAL_TABLET | Freq: Two times a day (BID) | ORAL | 0 refills | Status: DC | PRN

## 2018-07-30 NOTE — Telephone Encounter (Signed)
Pt called and would either like the medication changed to the 1mg  or please send the [facility]. Please advise and call patient back with what happens

## 2018-07-31 ENCOUNTER — Encounter: Payer: Self-pay | Admitting: Nurse Practitioner

## 2018-08-03 ENCOUNTER — Other Ambulatory Visit: Payer: Self-pay | Admitting: Nurse Practitioner

## 2018-08-03 DIAGNOSIS — I10 Essential (primary) hypertension: Secondary | ICD-10-CM

## 2018-08-03 MED ORDER — LOSARTAN POTASSIUM 100 MG PO TABS: 100.0000 mg | ORAL_TABLET | Freq: Every day | ORAL | 1 refills | Status: DC

## 2018-08-07 ENCOUNTER — Encounter: Payer: Self-pay | Admitting: Nurse Practitioner

## 2018-08-25 ENCOUNTER — Other Ambulatory Visit: Payer: Self-pay | Admitting: Nurse Practitioner

## 2018-08-25 ENCOUNTER — Encounter: Payer: Self-pay | Admitting: Nurse Practitioner

## 2018-08-25 DIAGNOSIS — F411 Generalized anxiety disorder: Secondary | ICD-10-CM

## 2018-08-25 NOTE — Telephone Encounter (Signed)
Daniel Butler Controlled Database Checked Last filled: 07/30/18 # 30 LOV w/you: 07/29/18 Next appt w/you: 10/28/18

## 2018-08-26 ENCOUNTER — Ambulatory Visit: Payer: Self-pay

## 2018-08-26 ENCOUNTER — Ambulatory Visit: Payer: Self-pay | Admitting: Internal Medicine

## 2018-09-22 ENCOUNTER — Other Ambulatory Visit: Payer: Self-pay | Admitting: Nurse Practitioner

## 2018-09-22 DIAGNOSIS — F411 Generalized anxiety disorder: Secondary | ICD-10-CM

## 2018-09-23 ENCOUNTER — Other Ambulatory Visit: Payer: Self-pay | Admitting: Nurse Practitioner

## 2018-09-23 DIAGNOSIS — I10 Essential (primary) hypertension: Secondary | ICD-10-CM

## 2018-09-25 LAB — LDL CHOLESTEROL, DIRECT: LDL Direct: 148 mg/dL — ABNORMAL HIGH (ref 0–99)

## 2018-09-25 LAB — LIPID PANEL
Chol/HDL Ratio: 7.6 ratio — ABNORMAL HIGH (ref 0.0–5.0)
Cholesterol, Total: 229 mg/dL — ABNORMAL HIGH (ref 100–199)
HDL: 30 mg/dL — ABNORMAL LOW (ref >39)
LDL Calculated: 123 mg/dL — ABNORMAL HIGH (ref 0–99)
TRIGLYCERIDES: 378 mg/dL — AB (ref 0–149)
VLDL Cholesterol Cal: 76 mg/dL — ABNORMAL HIGH (ref 5–40)

## 2018-09-30 ENCOUNTER — Encounter: Payer: Self-pay | Admitting: Internal Medicine

## 2018-09-30 ENCOUNTER — Ambulatory Visit (INDEPENDENT_AMBULATORY_CARE_PROVIDER_SITE_OTHER): Payer: Self-pay | Admitting: Internal Medicine

## 2018-09-30 ENCOUNTER — Other Ambulatory Visit: Payer: Self-pay | Admitting: Internal Medicine

## 2018-09-30 VITALS — BP 149/92 | HR 93 | Ht 73.0 in | Wt 259.6 lb

## 2018-09-30 DIAGNOSIS — F1010 Alcohol abuse, uncomplicated: Secondary | ICD-10-CM

## 2018-09-30 DIAGNOSIS — E785 Hyperlipidemia, unspecified: Secondary | ICD-10-CM

## 2018-09-30 DIAGNOSIS — E781 Pure hyperglyceridemia: Secondary | ICD-10-CM

## 2018-09-30 MED ORDER — ROSUVASTATIN CALCIUM 40 MG PO TABS: 40.0000 mg | ORAL_TABLET | Freq: Every day | ORAL | 3 refills | Status: DC

## 2018-09-30 NOTE — Patient Instructions (Addendum)
Medication Instructions:  START crestor 40mg  daily in addition to current medications If you need a refill on your cardiac medications before your next appointment, please call your pharmacy.   Lab work: FASTING lab work in 4-6 months to recheck cholesterol If you have labs (blood work) drawn today and your tests are completely normal, you will receive your results only by: Marland Kitchen MyChart Message (if you have MyChart) OR . A paper copy in the mail If you have any lab test that is abnormal or we need to change your treatment, we will call you to review the results.  Testing/Procedures: NONE  Follow-Up: At [facility], you and your health needs are our priority.  As part of our continuing mission to provide you with exceptional heart care, we have created designated Provider Care Teams.  These Care Teams include your primary Cardiologist (physician) and Advanced Practice Providers (APPs -  Physician Assistants and Nurse Practitioners) who all work together to provide you with the care you need, when you need it. You will need a follow up appointment in 4-6 months for LIPID CLINIC follow up.  Please call our office 2 months in advance to schedule this appointment.    Any Other Special Instructions Will Be Listed Below (If Applicable).  [facility] [address] [place], [place] [address] [phone]

## 2018-09-30 NOTE — Progress Notes (Addendum)
OFFICE CONSULT NOTE  Chief Complaint:  Follow-up high triglycerides  Primary Care Physician: Evaristo Bury, NP  HPI:  Daniel Butler is a 53 y.o. male who is being seen today for the evaluation of high triglycerides at the request of Carley Hammed, NP. This is a pleasant 53 year old male was kindly referred for evaluation of high triglycerides.  He has a long history of elevated cholesterol and primarily elevated triglycerides.  Unfortunately suffered a brain aneurysm several years ago.  He did survive however he struggled with depression and anxiety.  He tells me he has not worked since then but fortunately he says just prior to that he claims he won the lottery and says that he still has over $1 million.  Recently he said his wife left him because of heavy alcohol use.  He had previously drank more than 30 beers a week and typically drinks 6-12 beers per night.  Generally eats very little and seems to have poor nutrition.  He also seems to be somewhat unkempt today in the office.  He reports significant symptoms concerning for PTSD and shies away from driving or getting out of his house much.  He did say that his wife would be returning next month and he said that may help him "get on track".  Any chest pain or worsening shortness of breath.  He has no history of known coronary artery disease.  He reports being adopted and does not know his family history.  09/30/2018  Mr. Reine returns to follow-up on his dyslipidemia.  Now seems to be tolerating Vascepa and has had a reduction in triglycerides from 585-378.  We did a direct LDL which is markedly elevated 148.  Unfortunately his alcohol use continues.  We discussed how important would be to significantly reduce that and seek treatment and provided treatment information for him.  He is accompanied by his spouse today.  PMHx:  Past Medical History:  Diagnosis Date  . Brain aneurysm   . Chicken pox   . Depression   .  Gout   . High cholesterol   . Hypertension     Past Surgical History:  Procedure Laterality Date  . CRANIOTOMY Right 03/02/2015   Procedure: CRANIOTOMY INTRACRANIAL  ANEURYSM FOR CLIPPING;  Surgeon: Lisbeth Renshaw, MD;  [facility];  Service: Neurosurgery;  Laterality: Right;  . IR GENERIC HISTORICAL  02/16/2016   IR ANGIO VERTEBRAL SEL VERTEBRAL BILAT MOD SED 02/16/2016 Lisbeth Renshaw, MD [facility]  . IR GENERIC HISTORICAL  02/16/2016   IR ANGIO INTRA EXTRACRAN SEL INTERNAL CAROTID BILAT MOD SED 02/16/2016 Lisbeth Renshaw, MD [facility]  . RADIOLOGY WITH ANESTHESIA N/A 03/02/2015   Procedure: RADIOLOGY WITH ANESTHESIA;  Surgeon: Lisbeth Renshaw, MD;  Location: [facility];  Service: Radiology;  Laterality: N/A;  . WISDOM TOOTH EXTRACTION      FAMHx:  Family History  Adopted: Yes    SOCHx:   reports that he has been smoking cigarettes. He has a 25.00 pack-year smoking history. He has never used smokeless tobacco. He reports current alcohol use of about 35.0 standard drinks of alcohol per week. He reports that he does not use drugs.  ALLERGIES:  Allergies  Allergen Reactions  . Vascepa [Icosapent Ethyl] Diarrhea  . Zetia [Ezetimibe] Nausea And Vomiting    ROS: Pertinent items noted in HPI and remainder of comprehensive ROS otherwise negative.  HOME MEDS: Current Outpatient Medications on File Prior to Visit  Medication Sig Dispense Refill  . allopurinol (  ZYLOPRIM) 100 MG tablet Take 1 tablet (100 mg total) by mouth daily. 90 tablet 1  . ALPRAZolam (XANAX) 1 MG tablet TAKE 0.5 TABLETS (0.5 MG TOTAL) BY MOUTH 2 (TWO) TIMES DAILY AS NEEDED. FOR ANXIETY 30 tablet 0  . escitalopram (LEXAPRO) 10 MG tablet TAKE 1 TABLET BY MOUTH EVERY DAY 30 tablet 1  . fenofibrate (TRICOR) 145 MG tablet Take 1 tablet (145 mg total) by mouth daily. 30 tablet 1  . Icosapent Ethyl 1 g CAPS Take 2 capsules (2 g total) by mouth 2 (two) times daily. 120 capsule 5  . losartan (COZAAR) 100 MG  tablet Take 1 tablet (100 mg total) by mouth daily. Follow-up appt due in Feb must see provider for future refills 30 tablet 0   No current facility-administered medications on file prior to visit.     LABS/IMAGING: No results found for this or any previous visit (from the past 48 hour(s)). No results found.  LIPID PANEL:    Component Value Date/Time   CHOL 229 (H) 09/24/2018 0926   TRIG 378 (H) 09/24/2018 0926   HDL 30 (L) 09/24/2018 0926   CHOLHDL 7.6 (H) 09/24/2018 0926   CHOLHDL 5 05/20/2018 1557   LDLCALC 123 (H) 09/24/2018 0926   LDLDIRECT 148 (H) 09/24/2018 0926   LDLDIRECT 116.0 05/20/2018 1557    WEIGHTS: Wt Readings from Last 3 Encounters:  09/30/18 259 lb 9.6 oz (117.8 kg)  07/29/18 249 lb (112.9 kg)  06/02/18 246 lb 3.2 oz (111.7 kg)    VITALS: BP (!) 149/92   Pulse 93   Ht 6\' 1"  (1.854 m)   Wt 259 lb 9.6 oz (117.8 kg)   BMI 34.25 kg/m   EXAM: Deferred  EKG: Deferred  ASSESSMENT: 1. Secondary hypertriglyceridemia 2. Heavy etoh abuse 3. Tobacco abuse 4. Anxiety/depression - ?PTSD  PLAN: 1.   Mr. Sherburne has had some improvement in his triglycerides on Vascepa.  I suspected had marked improvement with alcohol cessation.  I have strongly encouraged him to continue to work on this and again provided information for detox.  He also has elevated LDL cholesterol with a direct LDL above goal.  This suggest that he would benefit additionally from high potency statin which would also lower triglycerides as well.  We will start rosuvastatin 40 mg daily.  Repeat lipid profile in 4-6 months with follow-up with me afterwards.  Chrystie Nose, MD, [facility], FACP  [place]  [facility]  Medical Director of the Advanced Lipid Disorders &  Cardiovascular Risk Reduction Clinic Diplomate of the American Board of Clinical Lipidology Attending Cardiologist  Direct Dial: [phone]  Fax: [phone]  Website:  [email or web address] 09/30/2018, 3:16 PM

## 2018-10-01 ENCOUNTER — Encounter: Payer: Self-pay | Admitting: Internal Medicine

## 2018-10-02 ENCOUNTER — Telehealth: Payer: Self-pay

## 2018-10-02 DIAGNOSIS — E781 Pure hyperglyceridemia: Secondary | ICD-10-CM

## 2018-10-02 MED ORDER — ICOSAPENT ETHYL 1 G PO CAPS: 2.0000 g | ORAL_CAPSULE | Freq: Two times a day (BID) | ORAL | 5 refills | Status: DC

## 2018-10-02 NOTE — Telephone Encounter (Signed)
Request for medication to be switched to different pharmacy. Submitted.

## 2018-10-16 ENCOUNTER — Other Ambulatory Visit: Payer: Self-pay | Admitting: Nurse Practitioner

## 2018-10-16 DIAGNOSIS — I10 Essential (primary) hypertension: Secondary | ICD-10-CM

## 2018-10-28 ENCOUNTER — Ambulatory Visit: Payer: Self-pay | Admitting: Nurse Practitioner

## 2018-10-29 ENCOUNTER — Other Ambulatory Visit: Payer: Self-pay | Admitting: Nurse Practitioner

## 2018-10-29 DIAGNOSIS — F411 Generalized anxiety disorder: Secondary | ICD-10-CM

## 2018-10-29 NOTE — Telephone Encounter (Signed)
Copied from CRM [id number]. Topic: Quick Communication - Rx Refill/Question >> Oct 29, 2018  2:32 PM Jilda Roche wrote: Medication: ALPRAZolam Prudy Feeler) 1 MG tablet   Has the patient contacted their pharmacy? Yes.   (Agent: If no, request that the patient contact the pharmacy for the refill.) (Agent: If yes, when and what did the pharmacy advise?) Call office  Preferred Pharmacy (with phone number or street name): [id number] [place], [place] - [address] AT [facility] [phone] (Phone) [phone] (Fax)    Agent: Please be advised that RX refills may take up to 3 business days. We ask that you follow-up with your pharmacy.

## 2018-10-29 NOTE — Telephone Encounter (Signed)
[place] Controlled Database Checked Last filled: 09/28/18 # 30 LOV w/you: 07/29/18 Next appt w/you: 11/18/18

## 2018-10-30 MED ORDER — ALPRAZOLAM 1 MG PO TABS: 0.5000 mg | ORAL_TABLET | Freq: Two times a day (BID) | ORAL | 0 refills | Status: DC | PRN

## 2018-11-08 ENCOUNTER — Other Ambulatory Visit: Payer: Self-pay | Admitting: Nurse Practitioner

## 2018-11-08 DIAGNOSIS — I10 Essential (primary) hypertension: Secondary | ICD-10-CM

## 2018-11-15 ENCOUNTER — Other Ambulatory Visit: Payer: Self-pay | Admitting: Nurse Practitioner

## 2018-11-15 DIAGNOSIS — F411 Generalized anxiety disorder: Secondary | ICD-10-CM

## 2018-11-17 ENCOUNTER — Other Ambulatory Visit: Payer: Self-pay | Admitting: Nurse Practitioner

## 2018-11-17 DIAGNOSIS — M1000 Idiopathic gout, unspecified site: Secondary | ICD-10-CM

## 2018-11-17 DIAGNOSIS — E785 Hyperlipidemia, unspecified: Secondary | ICD-10-CM

## 2018-11-18 ENCOUNTER — Ambulatory Visit: Payer: Self-pay | Admitting: Nurse Practitioner

## 2018-11-25 ENCOUNTER — Other Ambulatory Visit: Payer: Self-pay | Admitting: Nurse Practitioner

## 2018-11-25 DIAGNOSIS — F411 Generalized anxiety disorder: Secondary | ICD-10-CM

## 2018-11-25 NOTE — Telephone Encounter (Signed)
[place] Controlled Database Checked Last filled: 10/30/18 # 30 LOV w/you: 07/29/18 Next appt w/you: None

## 2018-11-30 ENCOUNTER — Telehealth: Payer: Self-pay

## 2018-11-30 DIAGNOSIS — F411 Generalized anxiety disorder: Secondary | ICD-10-CM

## 2018-11-30 DIAGNOSIS — M1000 Idiopathic gout, unspecified site: Secondary | ICD-10-CM

## 2018-11-30 DIAGNOSIS — E785 Hyperlipidemia, unspecified: Secondary | ICD-10-CM

## 2018-11-30 NOTE — Telephone Encounter (Signed)
Copied from CRM [id number]. Topic: Appointment Scheduling - Scheduling Inquiry for Clinic >> Nov 30, 2018 12:23 PM Terisa Starr wrote: Reason for CRM: patient would like to know can he have the virtual visit for his TOC for next week. Patient was transferring from [place] to [place]. Advised no well visits right now. Please contact patient. He would like his scripts sent to the pharmacy so he can just call to have them refilled when time. He wants them all to be for 90 quantity. Please advise.

## 2018-11-30 NOTE — Telephone Encounter (Signed)
Pt is calling in refills of these meds: Please give him a call back. He dose not want to run out of med when Ashleigh leaves.   [facility]

## 2018-12-02 ENCOUNTER — Other Ambulatory Visit: Payer: Self-pay | Admitting: Nurse Practitioner

## 2018-12-02 DIAGNOSIS — I10 Essential (primary) hypertension: Secondary | ICD-10-CM

## 2018-12-02 MED ORDER — ALPRAZOLAM 1 MG PO TABS: ORAL_TABLET | ORAL | 0 refills | Status: DC

## 2018-12-02 MED ORDER — ALLOPURINOL 100 MG PO TABS: 100.0000 mg | ORAL_TABLET | Freq: Every day | ORAL | 1 refills | Status: DC

## 2018-12-02 MED ORDER — FENOFIBRATE 145 MG PO TABS: 145.0000 mg | ORAL_TABLET | Freq: Every day | ORAL | 1 refills | Status: DC

## 2018-12-02 NOTE — Telephone Encounter (Signed)
I have sent his fenofibrate,  Allopurinol, alprazolam refills. I have not sent the losartan yet. When I am trying to send the losartan, it is telling me that it can not be sent due to "future order?" stacey can you please send 90 day supply of losartan for me

## 2018-12-02 NOTE — Addendum Note (Signed)
Addended by: Evaristo Bury on: 12/02/2018 12:18 PM   Modules accepted: Orders

## 2018-12-02 NOTE — Addendum Note (Signed)
Addended by: Evaristo Bury on: 12/02/2018 12:15 PM   Modules accepted: Orders

## 2018-12-03 MED ORDER — FENOFIBRATE 145 MG PO TABS: 145.0000 mg | ORAL_TABLET | Freq: Every day | ORAL | 1 refills | Status: DC

## 2018-12-03 MED ORDER — ALLOPURINOL 100 MG PO TABS: 100.0000 mg | ORAL_TABLET | Freq: Every day | ORAL | 1 refills | Status: DC

## 2018-12-03 NOTE — Telephone Encounter (Signed)
Patient would like to know could he be alerted on my chart when the [facility] [place]

## 2018-12-03 NOTE — Addendum Note (Signed)
Addended by: Merrilyn Puma on: 12/03/2018 08:50 AM   Modules accepted: Orders

## 2018-12-03 NOTE — Telephone Encounter (Addendum)
Losartan has been sent as well. See meds. Receipt confirmed at pharmacy. Pt informed   [facility]am [facility] [place].  Please resend Alprazolam.  Allopurinol and Fenofibrate sent  to [facility].

## 2018-12-04 ENCOUNTER — Encounter: Payer: Self-pay | Admitting: Nurse Practitioner

## 2018-12-04 MED ORDER — ALPRAZOLAM 1 MG PO TABS: ORAL_TABLET | ORAL | 0 refills | Status: DC

## 2018-12-04 NOTE — Telephone Encounter (Signed)
[facility] [place]

## 2018-12-04 NOTE — Addendum Note (Signed)
Addended by: Evaristo Bury on: 12/04/2018 09:18 AM   Modules accepted: Orders

## 2018-12-07 NOTE — Telephone Encounter (Signed)
Patient did no thave a need right now but will call back if he needs [place] and will call back to resch [place] appt later.

## 2018-12-08 ENCOUNTER — Encounter: Payer: Self-pay | Admitting: Nurse Practitioner

## 2019-01-19 ENCOUNTER — Telehealth: Payer: Self-pay | Admitting: *Deleted

## 2019-01-19 NOTE — Telephone Encounter (Signed)
A message was left, re: follow up visit. 

## 2019-01-20 ENCOUNTER — Other Ambulatory Visit: Payer: Self-pay | Admitting: *Deleted

## 2019-01-20 DIAGNOSIS — I10 Essential (primary) hypertension: Secondary | ICD-10-CM

## 2019-01-20 MED ORDER — LOSARTAN POTASSIUM 100 MG PO TABS: 100.0000 mg | ORAL_TABLET | Freq: Every day | ORAL | 0 refills | Status: DC

## 2019-01-22 ENCOUNTER — Encounter: Payer: Self-pay | Admitting: Family

## 2019-01-22 ENCOUNTER — Ambulatory Visit (INDEPENDENT_AMBULATORY_CARE_PROVIDER_SITE_OTHER): Payer: Self-pay | Admitting: Family

## 2019-01-22 VITALS — BP 130/72 | Temp 97.6°F | Wt 259.0 lb

## 2019-01-22 DIAGNOSIS — E782 Mixed hyperlipidemia: Secondary | ICD-10-CM

## 2019-01-22 DIAGNOSIS — I10 Essential (primary) hypertension: Secondary | ICD-10-CM

## 2019-01-22 DIAGNOSIS — F411 Generalized anxiety disorder: Secondary | ICD-10-CM

## 2019-01-22 DIAGNOSIS — M1000 Idiopathic gout, unspecified site: Secondary | ICD-10-CM

## 2019-01-22 MED ORDER — ALPRAZOLAM 0.5 MG PO TABS: 0.5000 mg | ORAL_TABLET | Freq: Three times a day (TID) | ORAL | 0 refills | Status: DC | PRN

## 2019-01-22 NOTE — Progress Notes (Signed)
Daniel Butler is a 53 y.o. male with the following history as recorded in EpicCare:  Patient Active Problem List   Diagnosis Date Noted  . Alcohol abuse 06/02/2018  . Tobacco abuse 06/02/2018  . Hypertriglyceridemia 01/19/2018  . Essential hypertension 10/02/2017  . Sciatica of right side 04/11/2017  . Sleep disturbance 05/19/2015  . Anxiety state 05/19/2015  . Generalized headaches 04/18/2015  . Gout 04/18/2015  . Encounter for orogastric (OG) tube placement   . Subarachnoid hemorrhage (HCC) 03/02/2015  . Acute respiratory failure with hypoxia (HCC)   . SAH (subarachnoid hemorrhage) (HCC)   . Subarachnoid bleed (HCC)   . Malignant hypertension     Current Outpatient Medications  Medication Sig Dispense Refill  . allopurinol (ZYLOPRIM) 100 MG tablet Take 1 tablet (100 mg total) by mouth daily. 90 tablet 1  . ALPRAZolam (XANAX) 0.5 MG tablet Take 1 tablet (0.5 mg total) by mouth 3 (three) times daily as needed for anxiety. 90 tablet 0  . fenofibrate (TRICOR) 145 MG tablet Take 1 tablet (145 mg total) by mouth daily. 90 tablet 1  . Icosapent Ethyl 1 g CAPS Take 2 capsules (2 g total) by mouth 2 (two) times daily. 120 capsule 5  . losartan (COZAAR) 100 MG tablet Take 1 tablet (100 mg total) by mouth daily. 30 tablet 0  . rosuvastatin (CRESTOR) 40 MG tablet Take 1 tablet (40 mg total) by mouth daily. 90 tablet 3   No current facility-administered medications for this visit.     Allergies: Zetia [ezetimibe]  Past Medical History:  Diagnosis Date  . Brain aneurysm   . Chicken pox   . Depression   . Gout   . High cholesterol   . Hypertension     Past Surgical History:  Procedure Laterality Date  . CRANIOTOMY Right 03/02/2015   Procedure: CRANIOTOMY INTRACRANIAL  ANEURYSM FOR CLIPPING;  Surgeon: Lisbeth Renshaw, MD;  [facility];  Service: Neurosurgery;  Laterality: Right;  . IR GENERIC HISTORICAL  02/16/2016   IR ANGIO VERTEBRAL SEL VERTEBRAL BILAT MOD SED  02/16/2016 Lisbeth Renshaw, MD [facility]  . IR GENERIC HISTORICAL  02/16/2016   IR ANGIO INTRA EXTRACRAN SEL INTERNAL CAROTID BILAT MOD SED 02/16/2016 Lisbeth Renshaw, MD [facility]  . RADIOLOGY WITH ANESTHESIA N/A 03/02/2015   Procedure: RADIOLOGY WITH ANESTHESIA;  Surgeon: Lisbeth Renshaw, MD;  Location: [facility];  Service: Radiology;  Laterality: N/A;  . WISDOM TOOTH EXTRACTION      Family History  Adopted: Yes    Social History   Tobacco Use  . Smoking status: Current Every Day Smoker    Packs/day: 1.00    Years: 25.00    Pack years: 25.00    Types: Cigarettes  . Smokeless tobacco: Never Used  Substance Use Topics  . Alcohol use: Yes    Alcohol/week: 35.0 standard drinks    Types: 35 Cans of beer per week    Subjective:   I connected with Denese Killings on 01/22/19 at  3:00 PM EDT by a video enabled telemedicine application and verified that I am speaking with the correct person using two identifiers.   I discussed the limitations of evaluation and management by telemedicine and the availability of in person appointments. The patient expressed understanding and agreed to proceed.  Patient PCP has recently left and needs to establish with new provider; feels that in general doing well except having increased problems with his anxiety; notes he is under the care of a therapist (  prefers not to give the person's name at this time) and is getting good help for anxiety/ depression; tapered himself off Lexapro with therapist's help; would like to discuss trial of Xanax 0.5 mg tid as opposed to bid since he has had to stop the Lexapro.  Feels doing well otherwise; no problems with gout- stable on current dose of Allopurinol; checks blood pressure regularly- doing well on Losartan; will be seeing his cardiologist later this summer- asking if our office can check his cholesterol for him.      Objective:  Vitals:   01/22/19 1626  BP: 130/72  Temp: 97.6 F (36.4 C)  Weight:  259 lb (117.5 kg)    General: Well developed, well nourished, in no acute distress   Head: Normocephalic and atraumatic  Lungs: Respirations unlabored;  Neurologic: Alert and oriented; speech intact; face symmetrical; moves all extremities well; CNII-XII intact without focal deficit   Assessment:  1. Anxiety state   2. Essential hypertension   3. Elevated triglycerides with high cholesterol   4. Idiopathic gout, unspecified chronicity, unspecified site     Plan:  1.  NCCSRS is reviewed- no abnormalities noted; patient is under care of therapist; notes he could not tolerate Lexapro due to side effects; asks for small adjustment to Xanax to 0.5 mg tid; one month given; drug screen will need to be checked before further refills given; 2. Stable; 3. Plan to check labs in 1 month; also due to see [name] later this year at lipid clinic; 4. Stable; plan to check uric acid level at next ov;  Follow-up in 1 month, sooner prn.   No follow-ups on file.  No orders of the defined types were placed in this encounter.   Requested Prescriptions   Signed Prescriptions Disp Refills  . ALPRAZolam (XANAX) 0.5 MG tablet 90 tablet 0    Sig: Take 1 tablet (0.5 mg total) by mouth 3 (three) times daily as needed for anxiety.

## 2019-02-15 ENCOUNTER — Other Ambulatory Visit: Payer: Self-pay | Admitting: Internal Medicine

## 2019-02-15 DIAGNOSIS — I10 Essential (primary) hypertension: Secondary | ICD-10-CM

## 2019-02-22 ENCOUNTER — Encounter: Payer: Self-pay | Admitting: Family

## 2019-02-22 ENCOUNTER — Other Ambulatory Visit: Payer: Self-pay

## 2019-02-22 ENCOUNTER — Ambulatory Visit (INDEPENDENT_AMBULATORY_CARE_PROVIDER_SITE_OTHER): Payer: Self-pay | Admitting: Family

## 2019-02-22 ENCOUNTER — Other Ambulatory Visit (INDEPENDENT_AMBULATORY_CARE_PROVIDER_SITE_OTHER): Payer: Self-pay

## 2019-02-22 VITALS — BP 140/82 | HR 86 | Temp 98.1°F | Ht 73.0 in | Wt 259.1 lb

## 2019-02-22 DIAGNOSIS — M25361 Other instability, right knee: Secondary | ICD-10-CM

## 2019-02-22 DIAGNOSIS — M1A9XX0 Chronic gout, unspecified, without tophus (tophi): Secondary | ICD-10-CM

## 2019-02-22 DIAGNOSIS — E781 Pure hyperglyceridemia: Secondary | ICD-10-CM

## 2019-02-22 DIAGNOSIS — R5383 Other fatigue: Secondary | ICD-10-CM

## 2019-02-22 DIAGNOSIS — F411 Generalized anxiety disorder: Secondary | ICD-10-CM

## 2019-02-22 DIAGNOSIS — M25561 Pain in right knee: Secondary | ICD-10-CM

## 2019-02-22 LAB — CBC WITH DIFFERENTIAL/PLATELET
Basophils Absolute: 0.1 10*3/uL (ref 0.0–0.1)
Basophils Relative: 1 % (ref 0.0–3.0)
Eosinophils Absolute: 0.2 10*3/uL (ref 0.0–0.7)
Eosinophils Relative: 2 % (ref 0.0–5.0)
HCT: 43.1 % (ref 39.0–52.0)
Hemoglobin: 14.8 g/dL (ref 13.0–17.0)
Lymphocytes Relative: 15.1 % (ref 12.0–46.0)
Lymphs Abs: 1.5 10*3/uL (ref 0.7–4.0)
MCHC: 34.3 g/dL (ref 30.0–36.0)
MCV: 108.2 fl — ABNORMAL HIGH (ref 78.0–100.0)
Monocytes Absolute: 0.4 10*3/uL (ref 0.1–1.0)
Monocytes Relative: 4.5 % (ref 3.0–12.0)
Neutro Abs: 7.8 10*3/uL — ABNORMAL HIGH (ref 1.4–7.7)
Neutrophils Relative %: 77.4 % — ABNORMAL HIGH (ref 43.0–77.0)
Platelets: 339 10*3/uL (ref 150.0–400.0)
RBC: 3.98 Mil/uL — ABNORMAL LOW (ref 4.22–5.81)
RDW: 14.4 % (ref 11.5–15.5)
WBC: 10 10*3/uL (ref 4.0–10.5)

## 2019-02-22 LAB — COMPREHENSIVE METABOLIC PANEL
ALT: 12 U/L (ref 0–53)
AST: 26 U/L (ref 0–37)
Albumin: 4.2 g/dL (ref 3.5–5.2)
Alkaline Phosphatase: 42 U/L (ref 39–117)
BUN: 7 mg/dL (ref 6–23)
CO2: 24 mEq/L (ref 19–32)
Calcium: 9.1 mg/dL (ref 8.4–10.5)
Chloride: 104 mEq/L (ref 96–112)
Creatinine, Ser: 1.15 mg/dL (ref 0.40–1.50)
GFR: 66.31 mL/min (ref >60.00)
Glucose, Bld: 114 mg/dL — ABNORMAL HIGH (ref 70–99)
Potassium: 4 mEq/L (ref 3.5–5.1)
Sodium: 138 mEq/L (ref 135–145)
Total Bilirubin: 0.4 mg/dL (ref 0.2–1.2)
Total Protein: 6.7 g/dL (ref 6.0–8.3)

## 2019-02-22 LAB — LIPID PANEL
Cholesterol: 120 mg/dL (ref 0–200)
HDL: 31.9 mg/dL — ABNORMAL LOW (ref >39.00)
NonHDL: 87.67
Total CHOL/HDL Ratio: 4
Triglycerides: 269 mg/dL — ABNORMAL HIGH (ref 0.0–149.0)
VLDL: 53.8 mg/dL — ABNORMAL HIGH (ref 0.0–40.0)

## 2019-02-22 LAB — LDL CHOLESTEROL, DIRECT: Direct LDL: 58 mg/dL

## 2019-02-22 LAB — HEMOGLOBIN A1C: Hgb A1c MFr Bld: 5.6 % (ref 4.6–6.5)

## 2019-02-22 LAB — URIC ACID: Uric Acid, Serum: 5.6 mg/dL (ref 4.0–7.8)

## 2019-02-22 MED ORDER — ALPRAZOLAM 0.5 MG PO TABS: 0.5000 mg | ORAL_TABLET | Freq: Three times a day (TID) | ORAL | 0 refills | Status: DC | PRN

## 2019-02-22 NOTE — Progress Notes (Signed)
Daniel Butler is a 53 y.o. male with the following history as recorded in EpicCare:  Patient Active Problem List   Diagnosis Date Noted  . Alcohol abuse 06/02/2018  . Tobacco abuse 06/02/2018  . Hypertriglyceridemia 01/19/2018  . Essential hypertension 10/02/2017  . Sciatica of right side 04/11/2017  . Sleep disturbance 05/19/2015  . Anxiety state 05/19/2015  . Generalized headaches 04/18/2015  . Gout 04/18/2015  . Encounter for orogastric (OG) tube placement   . Subarachnoid hemorrhage ([place]) 03/02/2015  . Acute respiratory failure with hypoxia ([place])   . SAH (subarachnoid hemorrhage) ([place])   . Subarachnoid bleed ([place])   . Malignant hypertension     Current Outpatient Medications  Medication Sig Dispense Refill  . allopurinol (ZYLOPRIM) 100 MG tablet Take 1 tablet (100 mg total) by mouth daily. 90 tablet 1  . ALPRAZolam (XANAX) 0.5 MG tablet Take 1 tablet (0.5 mg total) by mouth 3 (three) times daily as needed for anxiety. 90 tablet 0  . fenofibrate (TRICOR) 145 MG tablet Take 1 tablet (145 mg total) by mouth daily. 90 tablet 1  . Icosapent Ethyl 1 g CAPS Take 2 capsules (2 g total) by mouth 2 (two) times daily. 120 capsule 5  . losartan (COZAAR) 100 MG tablet Take 1 tablet (100 mg total) by mouth daily. Must keep scheduled appt for future refills 30 tablet 0  . rosuvastatin (CRESTOR) 40 MG tablet Take 1 tablet (40 mg total) by mouth daily. 90 tablet 3   No current facility-administered medications for this visit.     Allergies: Zetia [ezetimibe]  Past Medical History:  Diagnosis Date  . Brain aneurysm   . Chicken pox   . Depression   . Gout   . High cholesterol   . Hypertension     Past Surgical History:  Procedure Laterality Date  . CRANIOTOMY Right 03/02/2015   Procedure: CRANIOTOMY INTRACRANIAL  ANEURYSM FOR CLIPPING;  Surgeon: Consuella Lose, MD;  Location: [facility];  Service: Neurosurgery;  Laterality: Right;  . IR GENERIC HISTORICAL  02/16/2016   IR  ANGIO VERTEBRAL SEL VERTEBRAL BILAT MOD SED 02/16/2016 Consuella Lose, MD [facility]  . IR GENERIC HISTORICAL  02/16/2016   IR ANGIO INTRA EXTRACRAN SEL INTERNAL CAROTID BILAT MOD SED 02/16/2016 Consuella Lose, MD [facility]  . RADIOLOGY WITH ANESTHESIA N/A 03/02/2015   Procedure: RADIOLOGY WITH ANESTHESIA;  Surgeon: Consuella Lose, MD;  Location: [facility];  Service: Radiology;  Laterality: N/A;  . WISDOM TOOTH EXTRACTION      Family History  Adopted: Yes    Social History   Tobacco Use  . Smoking status: Current Every Day Smoker    Packs/day: 1.00    Years: 25.00    Pack years: 25.00    Types: Cigarettes  . Smokeless tobacco: Never Used  Substance Use Topics  . Alcohol use: Yes    Alcohol/week: 35.0 standard drinks    Types: 35 Cans of beer per week    Subjective:  Follow-up on chronic care needs:  1) During last virtual visit, patient requested that Xanax be changed to tid; had stopped Lexapro completely; states he is doing much better; continuing to work with therapist; wants to keep medication with this regimen; 2) Needs to get a lipid panel in preparation for upcoming visit with his cardiologist; 3) States that right knee "gives out on him"- feels that he needs a handicapped sticker for now; 4) Mentions chronic problems with vertigo- symptoms have been present for years/ patient is  a limited historian; does have problems with ear wax but uses Debrox regularly;  Objective:  Vitals:   02/22/19 1400  BP: 140/82  Pulse: 86  Temp: 98.1 F (36.7 C)  TempSrc: Oral  SpO2: 96%  Weight: 259 lb 1.3 oz (117.5 kg)  Height: _0  (1.854 m)    General: Well developed, well nourished, in no acute distress  Skin : Warm and dry.  Head: Normocephalic and atraumatic  Eyes: Sclera and conjunctiva clear; pupils round and reactive to light; extraocular movements intact  Ears: External normal; canals clear; tympanic membranes normal  Oropharynx: Pink, supple. No suspicious lesions   Neck: Supple without thyromegaly, adenopathy  Lungs: Respirations unlabored; clear to auscultation bilaterally without wheeze, rales, rhonchi  CVS exam: normal rate and regular rhythm.  Musculoskeletal: No deformities; no active joint inflammation  Extremities: No edema, cyanosis, clubbing  Vessels: Symmetric bilaterally  Neurologic: Alert and oriented; speech intact; face symmetrical; moves all extremities well; CNII-XII intact without focal deficit   Assessment:  1. Hypertriglyceridemia   2. Chronic gout without tophus, unspecified cause, unspecified site   3. 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 Other fatigue     Plan:  1. Update lipid panel today; keep planned follow-up with cardiology; 2. Check uric acid level today; 3. Refer to orthopedist- suspect he has a tear; 4. Check labs; may need to refer to ENT and/or neurology due to recurrent vertigo; follow-up to be determined.   No follow-ups on file.  Orders Placed This Encounter  Procedures  . Lipid panel    Standing Status:   Future    Number of Occurrences:   1    Standing Expiration Date:   02/22/2020  . Uric acid    Standing Status:   Future    Number of Occurrences:   1    Standing Expiration Date:   02/22/2020  . CBC w/Diff    Standing Status:   Future    Number of Occurrences:   1    Standing Expiration Date:   02/22/2020  . Comp Met (CMET)    Standing Status:   Future    Number of Occurrences:   1    Standing Expiration Date:   02/22/2020  . HgB A1c    Standing Status:   Future    Number of Occurrences:   1    Standing Expiration Date:   02/22/2020  . Ambulatory referral to Orthopedic Surgery    Referral Priority:   Routine    Referral Type:   Surgical    Referral Reason:   Specialty Services Required    Requested Specialty:   Orthopedic Surgery    Number of Visits Requested:   1    Requested Prescriptions    No prescriptions requested or ordered in this encounter

## 2019-02-23 ENCOUNTER — Other Ambulatory Visit: Payer: Self-pay | Admitting: Family

## 2019-02-23 DIAGNOSIS — R42 Dizziness and giddiness: Secondary | ICD-10-CM

## 2019-03-03 ENCOUNTER — Telehealth: Payer: Self-pay | Admitting: Internal Medicine

## 2019-03-03 NOTE — Telephone Encounter (Signed)
Mychart, smartphone, consent, pre reg complete 03/03/19 AF °

## 2019-03-08 ENCOUNTER — Telehealth (INDEPENDENT_AMBULATORY_CARE_PROVIDER_SITE_OTHER): Payer: Self-pay | Admitting: Internal Medicine

## 2019-03-08 ENCOUNTER — Telehealth: Payer: Self-pay | Admitting: Internal Medicine

## 2019-03-08 ENCOUNTER — Encounter: Payer: Self-pay | Admitting: Internal Medicine

## 2019-03-08 VITALS — BP 131/71 | HR 88 | Temp 97.6°F | Ht 73.0 in | Wt 258.1 lb

## 2019-03-08 DIAGNOSIS — E781 Pure hyperglyceridemia: Secondary | ICD-10-CM

## 2019-03-08 DIAGNOSIS — F1010 Alcohol abuse, uncomplicated: Secondary | ICD-10-CM

## 2019-03-08 NOTE — Progress Notes (Signed)
Virtual Visit via Video Note   This visit type was conducted due to national recommendations for restrictions regarding the COVID-19 Pandemic (e.g. social distancing) in an effort to limit this patient's exposure and mitigate transmission in our community.  Due to his co-morbid illnesses, this patient is at least at moderate risk for complications without adequate follow up.  This format is felt to be most appropriate for this patient at this time.  All issues noted in this document were discussed and addressed.  A limited physical exam was performed with this format.  Please refer to the patient's chart for his consent to telehealth for [facility].   Evaluation Performed:  Doxy.me video visit  Date:  03/08/2019   ID:  Daniel Butler, DOB Jun 21, 1966, MRN [id number]  Patient Location:  [place] [address]  Provider location:   [address] [place] [address] [place], [place] [address]  PCP:  Marrian Salvage, [place]  Cardiologist:  No primary care provider on file. Electrophysiologist:  None   Chief Complaint:  No complaints  History of Present Illness:    Daniel Butler is a 53 y.o. male who presents via audio/video conferencing for a telehealth visit today.  Daniel Butler was seen today for telehealth follow-up.  Overall he seems to be doing well.  He reports she is cut back on his alcohol use significantly.  He has been doing well on statin therapy in addition to his Vascepa.  He thinks the Vascepa may be causing some loose stools.  Overall though his lipid profile significantly improved.  Labs indicate his total cholesterol is decreased from 212-120.  Triglycerides have come down from 585-2 69.  His direct LDL is now target of 58.  This is reduced from 148 5 months ago.  While I think the medications have been helpful, I do also think this is due to dietary changes and reduction in alcohol intake.  He is working with his therapist on this.  The patient does  not have symptoms concerning for COVID-19 infection (fever, chills, cough, or new SHORTNESS OF BREATH).    Prior CV studies:   The following studies were reviewed today:  Chart reviewed  PMHx:  Past Medical History:  Diagnosis Date  . Brain aneurysm   . Chicken pox   . Depression   . Gout   . High cholesterol   . Hypertension     Past Surgical History:  Procedure Laterality Date  . CRANIOTOMY Right 03/02/2015   Procedure: CRANIOTOMY INTRACRANIAL  ANEURYSM FOR CLIPPING;  Surgeon: Consuella Lose, MD;  Location: [place] [facility]UR[facility];  Service: Neurosurgery;  Laterality: Right;  . IR GENERIC HISTORICAL  02/16/2016   IR ANGIO VERTEBRAL SEL VERTEBRAL BILAT MOD SED 02/16/2016 Consuella Lose, MD [facility]  . IR GENERIC HISTORICAL  02/16/2016   IR ANGIO INTRA EXTRACRAN SEL INTERNAL CAROTID BILAT MOD SED 02/16/2016 Consuella Lose, MD [facility]  . RADIOLOGY WITH ANESTHESIA N/A 03/02/2015   Procedure: RADIOLOGY WITH ANESTHESIA;  Surgeon: Consuella Lose, MD;  Location: [facility];  Service: Radiology;  Laterality: N/A;  . WISDOM TOOTH EXTRACTION      FAMHx:  Family History  Adopted: Yes    SOCHx:   reports that he has been smoking cigarettes. He has a 25.00 pack-year smoking history. He has never used smokeless tobacco. He reports current alcohol use of about 35.0 standard drinks of alcohol per week. He reports that he does not use drugs.  ALLERGIES:  Allergies  Allergen  Reactions  . Zetia [Ezetimibe] Nausea And Vomiting    MEDS:  Current Meds  Medication Sig  . allopurinol (ZYLOPRIM) 100 MG tablet Take 1 tablet (100 mg total) by mouth daily.  Marland Kitchen ALPRAZolam (XANAX) 0.5 MG tablet Take 1 tablet (0.5 mg total) by mouth 3 (three) times daily as needed for anxiety.  . fenofibrate (TRICOR) 145 MG tablet Take 1 tablet (145 mg total) by mouth daily.  Bess Harvest Ethyl 1 g CAPS Take 2 capsules (2 g total) by mouth 2 (two) times daily.  Marland Kitchen losartan (COZAAR) 100 MG tablet Take 1 tablet (100  mg total) by mouth daily. Must keep scheduled appt for future refills  . rosuvastatin (CRESTOR) 40 MG tablet Take 1 tablet (40 mg total) by mouth daily.     ROS: Pertinent items noted in HPI and remainder of comprehensive ROS otherwise negative.  Labs/Other Tests and Data Reviewed:    Recent Labs: 02/22/2019: ALT 12; BUN 7; Creatinine, Ser 1.15; Hemoglobin 14.8; Platelets 339.0; Potassium 4.0; Sodium 138   Recent Lipid Panel Lab Results  Component Value Date/Time   CHOL 120 02/22/2019 02:44 PM   CHOL 229 (H) 09/24/2018 09:26 AM   TRIG 269.0 (H) 02/22/2019 02:44 PM   HDL 31.90 (L) 02/22/2019 02:44 PM   HDL 30 (L) 09/24/2018 09:26 AM   CHOLHDL 4 02/22/2019 02:44 PM   LDLCALC 123 (H) 09/24/2018 09:26 AM   LDLDIRECT 58.0 02/22/2019 02:44 PM    Wt Readings from Last 3 Encounters:  03/08/19 258 lb 1.6 oz (117.1 kg)  02/22/19 259 lb 1.3 oz (117.5 kg)  01/22/19 259 lb (117.5 kg)     Exam:    Vital Signs:  BP 131/71   Pulse 88   Temp 97.6 F (36.4 C)   Ht 6\' 1"  (1.854 m)   Wt 258 lb 1.6 oz (117.1 kg)   BMI 34.05 kg/m    General appearance: alert and no distress Lungs: No visual respiratory difficulty Abdomen: soft, non-tender; bowel sounds normal; no masses,  no organomegaly Extremities: extremities normal, atraumatic, no cyanosis or edema Skin: Skin color, texture, turgor normal. No rashes or lesions Neurologic: Mental status: Alert, oriented, thought content appropriate Psych: Pleasant  ASSESSMENT & PLAN:    1. Secondary hypertriglyceridemia 2. Heavy etoh abuse 3. Tobacco abuse 4. Anxiety/depression - ?PTSD  Daniel Butler has had significant improvement in his lipid profile in both statin and Vascepa.  He is also cut back on his alcohol use significantly.  No changes will be made to his medicines today.  Plan follow-up with me in 6 months or sooner as necessary.  COVID-19 Education: The signs and symptoms of COVID-19 were discussed with the patient and how to seek  care for testing (follow up with PCP or arrange E-visit).  The importance of social distancing was discussed today.  Patient Risk:   After full review of this patients clinical status, I feel that they are at least moderate risk at this time.  Time:   Today, I have spent 25 minutes with the patient with telehealth technology discussing dyslipidemia, alcohol cessation.     Medication Adjustments/Labs and Tests Ordered: Current medicines are reviewed at length with the patient today.  Concerns regarding medicines are outlined above.   Tests Ordered: Orders Placed This Encounter  Procedures  . Lipid panel  . LDL cholesterol, direct    Medication Changes: No orders of the defined types were placed in this encounter.   Disposition:  in 6 month(s)  Daniel Fallen  C. Rennis GoldenHilty, MD, [facility]aleFACC, FACP  [place]  [facility]  Medical Director of the [facility] Lipid [facility] &  Cardiovascular Risk Reduction Clinic Diplomate of the American Board of Clinical Lipidology Attending Cardiologist  Direct Dial: [phone]  Fax: [phone]  Website:  [email or web address]  Daniel NoseKenneth C Gigi Onstad, MD  03/08/2019 4:54 PM

## 2019-03-08 NOTE — Telephone Encounter (Signed)
New message:    Patient calling he has not received hs link for his appt.

## 2019-03-08 NOTE — Telephone Encounter (Signed)
Pt has completed virtual appointment scheduled with Dr. Debara Pickett today 6/29,

## 2019-03-08 NOTE — Patient Instructions (Addendum)
Medication Instructions:  Your physician recommends that you continue on your current medications as directed. Please refer to the Current Medication list given to you today.  If you need a refill on your cardiac medications before your next appointment, please call your pharmacy.   Lab work: FASTING lab work in 6 months to check cholesterol (nothing to eat/drink after midnight) You can use Dr. Lysbeth Penner lab @ [place] your primary care provider's office  If you have labs (blood work) drawn today and your tests are completely normal, you will receive your results only by: Marland Kitchen MyChart Message (if you have MyChart) OR . A paper copy in the mail If you have any lab test that is abnormal or we need to change your treatment, we will call you to review the results.  Testing/Procedures: NONE  Follow-Up: At [facility], you and your health needs are our priority.  As part of our continuing mission to provide you with exceptional heart care, we have created designated Provider Care Teams.  These Care Teams include your primary Cardiologist (physician) and Advanced Practice Providers (APPs -  Physician Assistants and Nurse Practitioners) who all work together to provide you with the care you need, when you need it. You will need a follow up appointment in 6 months.  Please call our office 2 months in advance to schedule this appointment.  You may see No primary care provider on file. or one of the following Advanced Practice Providers on your designated Care Team: Almyra Deforest, [place] . Fabian Sharp, PA-C  Any Other Special Instructions Will Be Listed Below (If Applicable).

## 2019-03-10 ENCOUNTER — Other Ambulatory Visit: Payer: Self-pay | Admitting: Family

## 2019-03-10 DIAGNOSIS — I10 Essential (primary) hypertension: Secondary | ICD-10-CM

## 2019-03-23 ENCOUNTER — Other Ambulatory Visit: Payer: Self-pay | Admitting: Family

## 2019-03-23 DIAGNOSIS — F411 Generalized anxiety disorder: Secondary | ICD-10-CM

## 2019-03-24 ENCOUNTER — Ambulatory Visit: Payer: Self-pay | Admitting: Orthopedic Surgery

## 2019-04-07 ENCOUNTER — Ambulatory Visit: Payer: Self-pay | Admitting: Orthopedic Surgery

## 2019-04-12 ENCOUNTER — Other Ambulatory Visit: Payer: Self-pay | Admitting: Family

## 2019-04-12 DIAGNOSIS — I10 Essential (primary) hypertension: Secondary | ICD-10-CM

## 2019-04-12 MED ORDER — LOSARTAN POTASSIUM 100 MG PO TABS: 100.0000 mg | ORAL_TABLET | Freq: Every day | ORAL | 6 refills | Status: DC

## 2019-04-22 ENCOUNTER — Other Ambulatory Visit: Payer: Self-pay | Admitting: Internal Medicine

## 2019-04-22 DIAGNOSIS — E781 Pure hyperglyceridemia: Secondary | ICD-10-CM

## 2019-04-26 ENCOUNTER — Encounter: Payer: Self-pay | Admitting: Orthopedic Surgery

## 2019-04-26 ENCOUNTER — Ambulatory Visit (INDEPENDENT_AMBULATORY_CARE_PROVIDER_SITE_OTHER): Payer: Self-pay

## 2019-04-26 ENCOUNTER — Ambulatory Visit (INDEPENDENT_AMBULATORY_CARE_PROVIDER_SITE_OTHER): Payer: Self-pay | Admitting: Orthopedic Surgery

## 2019-04-26 DIAGNOSIS — M25361 Other instability, right knee: Secondary | ICD-10-CM

## 2019-04-26 NOTE — Progress Notes (Signed)
Office Visit Note   Patient: Daniel Butler           Date of Birth: 19-Feb-1966           MRN: [id number] Visit Date: 04/26/2019 Requested by: Olive Bass, FNP [address] [place],  [place] [address] PCP: Olive Bass, FNP  Subjective: Chief Complaint  Patient presents with  . right knee    weakness    HPI: Daniel Butler is a 53 y.o. male who presents to the office complaining of right knee instability.  Patient complains of ongoing right knee instability for the past 2 years.  Patient denies any acute injury that led to the start of his symptoms.  He denies significant pain or swelling.  He has occasional pain that is diffusely around his anterior knee.  He ambulates with a cane or walker to help with his balance.  The symptoms have affected his lifestyle and caused him to slow his gait significantly.  He notes that his symptoms are worse in the morning and improves as the day goes on.  He has a history of a brain aneurysm 4 years ago, for which he had to relearn how to walk.  He also has been having difficulty with vertigo that is been looked into by ENT.  Patient also notes a history of gout in the right knee.              ROS:  All systems reviewed are negative as they relate to the chief complaint within the history of present illness.  Patient denies fevers or chills.  Assessment & Plan: Visit Diagnoses:  1. Knee instability, right     Plan: Patient is a 53 year old male who presents with complaints of right knee instability for 2 years.  Patient's symptoms have been a huge change to his lifestyle.  2 years ago he was walking normally and "able to leap 3 feet on the floor".  He is not having significant pain and his main complaint is instability.  With such longstanding symptoms recommended MRI of the right knee versus cortisone injection versus both.  Patient wishes to proceed with MRI of the right knee to evaluate for a meniscal tear.  Patient  will follow-up after the MRI to review results.  Follow-Up Instructions: No follow-ups on file.   Orders:  Orders Placed This Encounter  Procedures  . XR Knee 1-2 Views Right  . MR Knee Right w/o contrast   No orders of the defined types were placed in this encounter.     Procedures: No procedures performed   Clinical Data: No additional findings.  Objective: Vital Signs: There were no vitals taken for this visit.  Physical Exam:  Constitutional: Patient appears well-developed HEENT:  Head: Normocephalic Eyes:EOM are normal Neck: Normal range of motion Cardiovascular: Normal rate Pulmonary/chest: Effort normal Neurologic: Patient is alert Skin: Skin is warm Psychiatric: Patient has normal mood and affect  Ortho Exam:  Right knee Exam No effusion Extensor mechanism intact No TTP over the medial or lateral jointlines, quad tendon, patellar tendon, pes anserinus, patella, tibial tubercle, LCL/MCL insertions Stable to varus/valgus stresses.  Stable to anterior/posterior drawer Extension to 0 degrees Flexion > 90 degrees   Specialty Comments:  No specialty comments available.  Imaging: No results found.   PMFS History: Patient Active Problem List   Diagnosis Date Noted  . Alcohol abuse 06/02/2018  . Tobacco abuse 06/02/2018  . Hypertriglyceridemia 01/19/2018  . Essential hypertension 10/02/2017  .  Sciatica of right side 04/11/2017  . Sleep disturbance 05/19/2015  . Anxiety state 05/19/2015  . Generalized headaches 04/18/2015  . Gout 04/18/2015  . Encounter for orogastric (OG) tube placement   . Subarachnoid hemorrhage ([place]) 03/02/2015  . Acute respiratory failure with hypoxia ([place])   . SAH (subarachnoid hemorrhage) ([place])   . Subarachnoid bleed ([place])   . Malignant hypertension    Past Medical History:  Diagnosis Date  . Brain aneurysm   . Chicken pox   . Depression   . Gout   . High cholesterol   . Hypertension     Family History  Adopted:  Yes    Past Surgical History:  Procedure Laterality Date  . CRANIOTOMY Right 03/02/2015   Procedure: CRANIOTOMY INTRACRANIAL  ANEURYSM FOR CLIPPING;  Surgeon: Consuella Lose, MD;  Location: [facility] NEURO ORS;  Service: Neurosurgery;  Laterality: Right;  . IR GENERIC HISTORICAL  02/16/2016   IR ANGIO VERTEBRAL SEL VERTEBRAL BILAT MOD SED 02/16/2016 Consuella Lose, MD [facility]  . IR GENERIC HISTORICAL  02/16/2016   IR ANGIO INTRA EXTRACRAN SEL INTERNAL CAROTID BILAT MOD SED 02/16/2016 Consuella Lose, MD [facility]  . RADIOLOGY WITH ANESTHESIA N/A 03/02/2015   Procedure: RADIOLOGY WITH ANESTHESIA;  Surgeon: Consuella Lose, MD;  Location: [facility];  Service: Radiology;  Laterality: N/A;  . WISDOM TOOTH EXTRACTION     Social History   Occupational History  . Occupation: Dance movement psychotherapist  Tobacco Use  . Smoking status: Current Every Day Smoker    Packs/day: 1.00    Years: 25.00    Pack years: 25.00    Types: Cigarettes  . Smokeless tobacco: Never Used  Substance and Sexual Activity  . Alcohol use: Yes    Alcohol/week: 35.0 standard drinks    Types: 35 Cans of beer per week  . Drug use: No  . Sexual activity: Not on file

## 2019-04-28 ENCOUNTER — Encounter: Payer: Self-pay | Admitting: Orthopedic Surgery

## 2019-05-25 ENCOUNTER — Other Ambulatory Visit: Payer: Self-pay | Admitting: Family

## 2019-05-25 DIAGNOSIS — F411 Generalized anxiety disorder: Secondary | ICD-10-CM

## 2019-05-27 ENCOUNTER — Other Ambulatory Visit: Payer: Self-pay

## 2019-06-02 ENCOUNTER — Ambulatory Visit: Payer: Self-pay | Admitting: Orthopedic Surgery

## 2019-06-21 ENCOUNTER — Ambulatory Visit
Admission: RE | Admit: 2019-06-21 | Discharge: 2019-06-21 | Disposition: A | Discharge status: Home / Self Care | Payer: No Typology Code available for payment source | Source: Ambulatory Visit | Attending: Orthopedic Surgery | Admitting: Orthopedic Surgery

## 2019-06-21 DIAGNOSIS — M25361 Other instability, right knee: Secondary | ICD-10-CM

## 2019-06-28 ENCOUNTER — Other Ambulatory Visit: Payer: Self-pay

## 2019-06-28 ENCOUNTER — Ambulatory Visit (INDEPENDENT_AMBULATORY_CARE_PROVIDER_SITE_OTHER): Payer: Self-pay | Admitting: Orthopedic Surgery

## 2019-06-28 ENCOUNTER — Encounter: Payer: Self-pay | Admitting: Orthopedic Surgery

## 2019-06-28 DIAGNOSIS — M23203 Derangement of unspecified medial meniscus due to old tear or injury, right knee: Secondary | ICD-10-CM

## 2019-06-28 NOTE — Progress Notes (Signed)
Office Visit Note   Patient: Daniel Butler           Date of Birth: 12-19-1965           MRN: [id number] Visit Date: 06/28/2019 Requested by: Olive Bass, FNP [address] [place],  [place] [address] PCP: Olive Bass, FNP  Subjective: Chief Complaint  Patient presents with   Follow-up    HPI: Daniel Butler is a 53 y.o. male who presents to the office complaining of right knee instability.  Patient was last seen on 04/26/2019.  He had MRI of the right knee on 06/21/2019 that revealed medial meniscal posterior horn tear with a para meniscal cyst, mild tricompartmental osteoarthritis.  Today in the clinic patient denies any pain.  His only complaint is that his knee gives way at times.  This is exacerbated when going down stairs especially.  He denies any mechanical symptoms.              ROS:  All systems reviewed are negative as they relate to the chief complaint within the history of present illness.  Patient denies fevers or chills.  Assessment & Plan: Visit Diagnoses:  1. Old tear of medial meniscus of right knee, unspecified tear type     Plan: Patient is a 53 year old male who presents for follow-up of MRI right knee.  He is complaining of right knee instability.  MRI revealed medial meniscal tear of the right knee with no ligamentous injury.  Patient has no pain or tenderness on exam.  He has no ligamentous laxity on exam.  There is no obvious reason for patient's right knee instability symptoms.  Patient inquired whether losing weight and strengthening his quadriceps with an exercise bike would help; I recommended that patient tries this regimen and he may follow-up as needed for cortisone shot if he develops pain due to the medial meniscal tear.  Patient agreed with this plan and will follow-up as needed.  Neck step would be cortisone injection if he develops effusion and mechanical symptoms in that right knee.  Medial meniscal tear currently  asymptomatic.  Strongly discouraged him from running for exercise  Follow-Up Instructions: No follow-ups on file.   Orders:  No orders of the defined types were placed in this encounter.  No orders of the defined types were placed in this encounter.     Procedures: No procedures performed   Clinical Data: No additional findings.  Objective: Vital Signs: There were no vitals taken for this visit.  Physical Exam:  Constitutional: Patient appears well-developed HEENT:  Head: Normocephalic Eyes:EOM are normal Neck: Normal range of motion Cardiovascular: Normal rate Pulmonary/chest: Effort normal Neurologic: Patient is alert Skin: Skin is warm Psychiatric: Patient has normal mood and affect  Ortho Exam:  right Knee Exam No effusion Extensor mechanism intact No TTP over the medial or lateral jointlines, quad tendon, patellar tendon, pes anserinus, patella, tibial tubercle, LCL/MCL insertions Stable to varus/valgus stresses.  Stable to anterior/posterior drawer Extension to 0 degrees Flexion > 90 degrees  Specialty Comments:  No specialty comments available.  Imaging: No results found.   PMFS History: Patient Active Problem List   Diagnosis Date Noted   Alcohol abuse 06/02/2018   Tobacco abuse 06/02/2018   Hypertriglyceridemia 01/19/2018   Essential hypertension 10/02/2017   Sciatica of right side 04/11/2017   Sleep disturbance 05/19/2015   Anxiety state 05/19/2015   Generalized headaches 04/18/2015   Gout 04/18/2015   Encounter for orogastric (OG) tube  placement    Subarachnoid hemorrhage ([place]) 03/02/2015   Acute respiratory failure with hypoxia ([facility])    SAH (subarachnoid hemorrhage) ([facility])    Subarachnoid bleed ([place])    Malignant hypertension    Past Medical History:  Diagnosis Date   Brain aneurysm    Chicken pox    Depression    Gout    High cholesterol    Hypertension     Family History  Adopted: Yes    Past Surgical  History:  Procedure Laterality Date   CRANIOTOMY Right 03/02/2015   Procedure: CRANIOTOMY INTRACRANIAL  ANEURYSM FOR CLIPPING;  Surgeon: Consuella Lose, MD;  [facility];  Service: Neurosurgery;  Laterality: Right;   IR GENERIC HISTORICAL  02/16/2016   IR ANGIO VERTEBRAL SEL VERTEBRAL BILAT MOD SED 02/16/2016 Consuella Lose, MD [facility]   IR GENERIC HISTORICAL  02/16/2016   IR ANGIO INTRA EXTRACRAN SEL INTERNAL CAROTID BILAT MOD SED 02/16/2016 Consuella Lose, MD [facility]   RADIOLOGY WITH ANESTHESIA N/A 03/02/2015   Procedure: RADIOLOGY WITH ANESTHESIA;  Surgeon: Consuella Lose, MD;  Location: [facility];  Service: Radiology;  Laterality: N/A;   WISDOM TOOTH EXTRACTION     Social History   Occupational History   Occupation: Dance movement psychotherapist  Tobacco Use   Smoking status: Current Every Day Smoker    Packs/day: 1.00    Years: 25.00    Pack years: 25.00    Types: Cigarettes   Smokeless tobacco: Never Used  Substance and Sexual Activity   Alcohol use: Yes    Alcohol/week: 35.0 standard drinks    Types: 35 Cans of beer per week   Drug use: No   Sexual activity: Not on file

## 2019-07-24 ENCOUNTER — Other Ambulatory Visit: Payer: Self-pay | Admitting: Family

## 2019-07-24 DIAGNOSIS — F411 Generalized anxiety disorder: Secondary | ICD-10-CM

## 2019-07-25 ENCOUNTER — Encounter: Payer: Self-pay | Admitting: Family

## 2019-07-26 ENCOUNTER — Telehealth: Payer: Self-pay

## 2019-07-26 NOTE — Telephone Encounter (Signed)
Paper given to Mickel Baas to complete. Will mail out to patient once she completes.

## 2019-07-27 ENCOUNTER — Encounter: Payer: Self-pay | Admitting: Family

## 2019-07-27 ENCOUNTER — Ambulatory Visit (INDEPENDENT_AMBULATORY_CARE_PROVIDER_SITE_OTHER): Payer: Self-pay | Admitting: Family

## 2019-07-27 VITALS — BP 130/72 | HR 89 | Wt 261.0 lb

## 2019-07-27 DIAGNOSIS — E781 Pure hyperglyceridemia: Secondary | ICD-10-CM

## 2019-07-27 DIAGNOSIS — M1000 Idiopathic gout, unspecified site: Secondary | ICD-10-CM

## 2019-07-27 DIAGNOSIS — R42 Dizziness and giddiness: Secondary | ICD-10-CM

## 2019-07-27 DIAGNOSIS — K5909 Other constipation: Secondary | ICD-10-CM

## 2019-07-27 DIAGNOSIS — E785 Hyperlipidemia, unspecified: Secondary | ICD-10-CM

## 2019-07-27 DIAGNOSIS — I10 Essential (primary) hypertension: Secondary | ICD-10-CM

## 2019-07-27 MED ORDER — ALLOPURINOL 100 MG PO TABS: 100.0000 mg | ORAL_TABLET | Freq: Every day | ORAL | 3 refills | Status: DC

## 2019-07-27 MED ORDER — FENOFIBRATE 145 MG PO TABS: 145.0000 mg | ORAL_TABLET | Freq: Every day | ORAL | 3 refills | Status: DC

## 2019-07-27 NOTE — Progress Notes (Signed)
Daniel Butler is a 53 y.o. male with the following history as recorded in EpicCare:  Patient Active Problem List   Diagnosis Date Noted  . Alcohol abuse 06/02/2018  . Tobacco abuse 06/02/2018  . Hypertriglyceridemia 01/19/2018  . Essential hypertension 10/02/2017  . Sciatica of right side 04/11/2017  . Sleep disturbance 05/19/2015  . Anxiety state 05/19/2015  . Generalized headaches 04/18/2015  . Gout 04/18/2015  . Encounter for orogastric (OG) tube placement   . Subarachnoid hemorrhage ([facility]) 03/02/2015  . Acute respiratory failure with hypoxia ([facility])   . SAH (subarachnoid hemorrhage) ([facility])   . Subarachnoid bleed ([facility])   . Malignant hypertension     Current Outpatient Medications  Medication Sig Dispense Refill  . allopurinol (ZYLOPRIM) 100 MG tablet Take 1 tablet (100 mg total) by mouth daily. 90 tablet 3  . ALPRAZolam (XANAX) 0.5 MG tablet TAKE 1 TABLET BY MOUTH THREE TIMES A DAY AS NEEDED FOR ANXIETY 90 tablet 1  . fenofibrate (TRICOR) 145 MG tablet Take 1 tablet (145 mg total) by mouth daily. 90 tablet 3  . losartan (COZAAR) 100 MG tablet Take 1 tablet (100 mg total) by mouth daily. 30 tablet 6  . rosuvastatin (CRESTOR) 40 MG tablet Take 1 tablet (40 mg total) by mouth daily. 90 tablet 3  . VASCEPA 1 g CAPS Take 2 capsules by mouth twice daily 120 capsule 6   No current facility-administered medications for this visit.     Allergies: Zetia [ezetimibe]  Past Medical History:  Diagnosis Date  . Brain aneurysm   . Chicken pox   . Depression   . Gout   . High cholesterol   . Hypertension     Past Surgical History:  Procedure Laterality Date  . CRANIOTOMY Right 03/02/2015   Procedure: CRANIOTOMY INTRACRANIAL  ANEURYSM FOR CLIPPING;  Surgeon: Lisbeth Renshaw, MD;  Location: [facility];  Service: Neurosurgery;  Laterality: Right;  . IR GENERIC HISTORICAL  02/16/2016   IR ANGIO VERTEBRAL SEL VERTEBRAL BILAT MOD SED 02/16/2016 Lisbeth Renshaw, MD [facility]  . IR  GENERIC HISTORICAL  02/16/2016   IR ANGIO INTRA EXTRACRAN SEL INTERNAL CAROTID BILAT MOD SED 02/16/2016 Lisbeth Renshaw, MD [facility]  . RADIOLOGY WITH ANESTHESIA N/A 03/02/2015   Procedure: RADIOLOGY WITH ANESTHESIA;  Surgeon: Lisbeth Renshaw, MD;  [facility];  Service: Radiology;  Laterality: N/A;  . WISDOM TOOTH EXTRACTION      Family History  Adopted: Yes    Social History   Tobacco Use  . Smoking status: Current Every Day Smoker    Packs/day: 1.00    Years: 25.00    Pack years: 25.00    Types: Cigarettes  . Smokeless tobacco: Never Used  Substance Use Topics  . Alcohol use: Yes    Alcohol/week: 35.0 standard drinks    Types: 35 Cans of beer per week    Subjective:    I connected with Denese Killings on 07/27/19 at 11:00 AM EST by a telephone call and verified that I am speaking with the correct person using two identifiers. Provider is in the office, patient is at home. Provider and patient are the only 2 people on the video call.    I discussed the limitations of evaluation and management by telephone and the availability of in person appointments. The patient expressed understanding and agreed to proceed.  1) Concerns for chronic constipation- wondering about safest options for OTC management; was wondering if Miralax was safe to use for reasonable option.  2) Planning to purchase a home bike to help strengthen his knee 3) Requesting updated prescriptions for Allopurinol, Fenofibrate; planning to see cardiology in December; 4) Stable on Losartan for blood pressure control;    Objective:  Vitals:   07/27/19 1127  BP: 130/72  Pulse: 89  Weight: 261 lb (118.4 kg)    General: Well developed, well nourished, in no acute distress  Skin : Warm and dry.  Lungs: Respirations unlabored;  Neurologic: Alert and oriented; speech intact; face symmetrical;   Assessment:  1. Chronic constipation   2. Hyperlipidemia, unspecified hyperlipidemia type   3.  Idiopathic gout, unspecified chronicity, unspecified site   4. Essential hypertension   5. Vertigo   6. Hypertriglyceridemia     Plan:  1. Okay to use OTC prunes or Miralax; 2. Continue same medications- keep planned follow up with Dr. Debara Pickett; 3. Stable; refill updated; 4. Stable; continue same medications. 5. Encouraged to continue seeing his neurologist since ENT work-up unremarkable;  Spent 22 minutes on the phone with patient.    No follow-ups on file.  No orders of the defined types were placed in this encounter.   Requested Prescriptions   Signed Prescriptions Disp Refills  . fenofibrate (TRICOR) 145 MG tablet 90 tablet 3    Sig: Take 1 tablet (145 mg total) by mouth daily.  Marland Kitchen allopurinol (ZYLOPRIM) 100 MG tablet 90 tablet 3    Sig: Take 1 tablet (100 mg total) by mouth daily.

## 2019-08-09 ENCOUNTER — Telehealth: Payer: Self-pay | Admitting: Family

## 2019-08-09 ENCOUNTER — Other Ambulatory Visit: Payer: Self-pay | Admitting: Family

## 2019-08-09 DIAGNOSIS — I10 Essential (primary) hypertension: Secondary | ICD-10-CM

## 2019-08-09 MED ORDER — LOSARTAN POTASSIUM 100 MG PO TABS: 100.0000 mg | ORAL_TABLET | Freq: Every day | ORAL | 6 refills | Status: DC

## 2019-08-09 NOTE — Telephone Encounter (Signed)
Patient reached out to [facility] on 08/05/19 at 8:49pm.  States he had a script ([facility] did not get name of script) [facility]  States med was $62.00.  Wanted script sent to [facility] on [address].   I did follow up with patient in regard.  States the name of the med is losartan.  States he has picked this med up already but would like refills sent to [facility].

## 2019-09-09 ENCOUNTER — Telehealth: Payer: Self-pay | Admitting: Internal Medicine

## 2019-09-15 ENCOUNTER — Other Ambulatory Visit: Payer: Self-pay

## 2019-09-15 ENCOUNTER — Other Ambulatory Visit: Payer: Self-pay | Admitting: Internal Medicine

## 2019-09-16 ENCOUNTER — Other Ambulatory Visit: Payer: Self-pay | Admitting: Internal Medicine

## 2019-09-16 ENCOUNTER — Telehealth: Payer: Self-pay | Admitting: Internal Medicine

## 2019-09-16 MED ORDER — ROSUVASTATIN CALCIUM 40 MG PO TABS: 40.0000 mg | ORAL_TABLET | Freq: Every day | ORAL | 0 refills | Status: DC

## 2019-09-16 NOTE — Telephone Encounter (Signed)
New Message   *STAT* If patient is at the pharmacy, call can be transferred to refill team.   1. Which medications need to be refilled? (please list name of each medication and dose if known) rosuvastatin (CRESTOR) 40 MG tablet  2. Which pharmacy/location (including street and city if local pharmacy) is medication to be sent to? [facility] [address] [place], [place] - [address]  3. Do they need a 30 day or 90 day supply? 90

## 2019-09-17 ENCOUNTER — Telehealth: Payer: Self-pay | Admitting: Family

## 2019-09-17 ENCOUNTER — Other Ambulatory Visit: Payer: Self-pay | Admitting: Family

## 2019-09-17 DIAGNOSIS — M1000 Idiopathic gout, unspecified site: Secondary | ICD-10-CM

## 2019-09-17 DIAGNOSIS — E785 Hyperlipidemia, unspecified: Secondary | ICD-10-CM

## 2019-09-17 MED ORDER — FENOFIBRATE 145 MG PO TABS: 145.0000 mg | ORAL_TABLET | Freq: Every day | ORAL | 3 refills | Status: DC

## 2019-09-17 MED ORDER — ALLOPURINOL 100 MG PO TABS: 100.0000 mg | ORAL_TABLET | Freq: Every day | ORAL | 3 refills | Status: DC

## 2019-09-17 NOTE — Telephone Encounter (Signed)
Medication Refill - Medication: allopurinol (ZYLOPRIM) 100 MG tablet   fenofibrate (TRICOR) 145 MG tablet  Pt is out of meds   Has the patient contacted their pharmacy? Yes.   (Agent: If no, request that the patient contact the pharmacy for the refill.) (Agent: If yes, when and what did the pharmacy advise?) Pt stated pharmacy advised him they have sent request with no response from office/ please advise asap   Preferred Pharmacy (with phone number or street name):  [facility] [address] [place], [place] - [address] [address] [phone] [address] Phone:  [phone]  Fax:  [phone]       Agent: Please be advised that RX refills may take up to 3 business days. We ask that you follow-up with your pharmacy.

## 2019-09-17 NOTE — Telephone Encounter (Signed)
I refilled all of his medications for a year in November. He told me in November [facility]?

## 2019-09-18 ENCOUNTER — Encounter: Payer: Self-pay | Admitting: Family

## 2019-09-20 ENCOUNTER — Other Ambulatory Visit: Payer: Self-pay | Admitting: Family

## 2019-09-20 ENCOUNTER — Encounter: Payer: Self-pay | Admitting: Family

## 2019-09-20 DIAGNOSIS — F411 Generalized anxiety disorder: Secondary | ICD-10-CM

## 2019-10-12 ENCOUNTER — Telehealth: Payer: Self-pay | Admitting: Internal Medicine

## 2019-10-12 NOTE — Telephone Encounter (Signed)
I've never ordered something like that .. if he can't walk well enough to get out of the house, why don't we reschedule the appt and labs before it until a later date when he recovers.  Dr Rexene Edison

## 2019-10-12 NOTE — Telephone Encounter (Signed)
Patient is calling to request for an arrangement to be made in which the patient will provide extra funding to have someone from the lab retreive the patient's lipid samples from his house due to "leg troubles". Please call to discuss.

## 2019-10-12 NOTE — Telephone Encounter (Signed)
Pt updated and voiced he would like to keep scheduled appointment for now, but will see how he feels closer to that date.

## 2019-10-12 NOTE — Telephone Encounter (Signed)
Pt called inquiring if he could have a nurse come out to draw his labs. He report having a torn right knee meniscus and unable to walk long distances or up stairs. He state he has a virtual appointment scheduled on 3/4 with [name] and would like labs drawn before then.  Will route to MD.

## 2019-10-20 ENCOUNTER — Encounter: Payer: Self-pay | Admitting: Family

## 2019-10-20 ENCOUNTER — Telehealth: Payer: Self-pay

## 2019-10-20 NOTE — Telephone Encounter (Signed)
New message     1. Which medications need to be refilled? (please list name of each medication and dose if known) ALPRAZolam (XANAX) 0.5 MG tablet  2. Which pharmacy/location (including street and city if local pharmacy) [facility] [place]   3. Do they need a 30 day or 90 day supply? 30 day supply

## 2019-10-21 LAB — LIPID PANEL
Chol/HDL Ratio: 3.5 ratio (ref 0.0–5.0)
Cholesterol, Total: 120 mg/dL (ref 100–199)
HDL: 34 mg/dL — ABNORMAL LOW (ref >39)
LDL Chol Calc (NIH): 49 mg/dL (ref 0–99)
Triglycerides: 232 mg/dL — ABNORMAL HIGH (ref 0–149)
VLDL Cholesterol Cal: 37 mg/dL (ref 5–40)

## 2019-10-21 LAB — LDL CHOLESTEROL, DIRECT: LDL Direct: 48 mg/dL (ref 0–99)

## 2019-10-22 ENCOUNTER — Other Ambulatory Visit: Payer: Self-pay | Admitting: Family

## 2019-10-22 DIAGNOSIS — F411 Generalized anxiety disorder: Secondary | ICD-10-CM

## 2019-10-22 MED ORDER — ALPRAZOLAM 0.5 MG PO TABS: ORAL_TABLET | ORAL | 0 refills | Status: DC

## 2019-10-28 ENCOUNTER — Other Ambulatory Visit: Payer: Self-pay | Admitting: Family

## 2019-10-28 DIAGNOSIS — I10 Essential (primary) hypertension: Secondary | ICD-10-CM

## 2019-11-11 ENCOUNTER — Telehealth (INDEPENDENT_AMBULATORY_CARE_PROVIDER_SITE_OTHER): Payer: Self-pay | Admitting: Internal Medicine

## 2019-11-11 ENCOUNTER — Encounter: Payer: Self-pay | Admitting: Internal Medicine

## 2019-11-11 VITALS — BP 130/80 | HR 84 | Temp 97.9°F | Ht 73.0 in | Wt 264.0 lb

## 2019-11-11 DIAGNOSIS — E781 Pure hyperglyceridemia: Secondary | ICD-10-CM

## 2019-11-11 DIAGNOSIS — F1010 Alcohol abuse, uncomplicated: Secondary | ICD-10-CM

## 2019-11-11 MED ORDER — ICOSAPENT ETHYL 1 G PO CAPS: 2.0000 g | ORAL_CAPSULE | Freq: Two times a day (BID) | ORAL | 11 refills | Status: DC

## 2019-11-11 NOTE — Progress Notes (Signed)
Virtual Visit via Video Note   This visit type was conducted due to national recommendations for restrictions regarding the COVID-19 Pandemic (e.g. social distancing) in an effort to limit this patient's exposure and mitigate transmission in our community.  Due to his co-morbid illnesses, this patient is at least at moderate risk for complications without adequate follow up.  This format is felt to be most appropriate for this patient at this time.  All issues noted in this document were discussed and addressed.  A limited physical exam was performed with this format.  Please refer to the patient's chart for his consent to telehealth for [facility].   Evaluation Performed:  Telephone visit  Date:  11/11/2019   ID:  Daniel Butler, DOB [birth year], MRN [id number]  Patient Location:  [address] [place] [address]  Provider location:   [address] [place], [place] [address]  PCP:  Olive Bass, FNP  Cardiologist:  No primary care provider on file. Electrophysiologist:  None   Chief Complaint:  No complaints  History of Present Illness:    Daniel Butler is a 54 y.o. male who presents via audio/video conferencing for a telehealth visit today.  Daniel Butler was seen today for telehealth follow-up.  Overall he seems to be doing well.  He reports she is cut back on his alcohol use significantly.  He has been doing well on statin therapy in addition to his Vascepa.  He thinks the Vascepa may be causing some loose stools.  Overall though his lipid profile significantly improved.  Labs indicate his total cholesterol is decreased from 212-120.  Triglycerides have come down from 585-2 69.  His direct LDL is now target of 58.  This is reduced from 148 5 months ago.  While I think the medications have been helpful, I do also think this is due to dietary changes and reduction in alcohol intake.  He is working with his therapist on this.  11/11/2019  Mr.  Butler is seen today for a telephone conference visit.  Overall he seems to be doing well.  He says he is continue to cut back on his alcohol intake.  His lipid profile is further improved.  Total cholesterol is now 120, triglycerides 232, HDL 34 and LDL 49.  Triglycerides a year ago were 585.  He is compliant with Vascepa.  Again he has decreased his alcohol intake however now just drinks light beers.  He is also tried to make some dietary changes.  He says he is eating a lot of fish.  The patient does not have symptoms concerning for COVID-19 infection (fever, chills, cough, or new SHORTNESS OF BREATH).    Prior CV studies:   The following studies were reviewed today:  Chart reviewed  PMHx:  Past Medical History:  Diagnosis Date  . Brain aneurysm   . Chicken pox   . Depression   . Gout   . High cholesterol   . Hypertension     Past Surgical History:  Procedure Laterality Date  . CRANIOTOMY Right 03/02/2015   Procedure: CRANIOTOMY INTRACRANIAL  ANEURYSM FOR CLIPPING;  Surgeon: Lisbeth Renshaw, MD;  Location: [facility];  Service: Neurosurgery;  Laterality: Right;  . IR GENERIC HISTORICAL  02/16/2016   IR ANGIO VERTEBRAL SEL VERTEBRAL BILAT MOD SED 02/16/2016 Lisbeth Renshaw, MD [facility]  . IR GENERIC HISTORICAL  02/16/2016   IR ANGIO INTRA EXTRACRAN SEL INTERNAL CAROTID BILAT MOD SED 02/16/2016 Lisbeth Renshaw, MD [facility]  . RADIOLOGY WITH ANESTHESIA N/A 03/02/2015   Procedure: RADIOLOGY WITH ANESTHESIA;  Surgeon: Lisbeth Renshaw, MD;  Location: [facility] OR;  Service: Radiology;  Laterality: N/A;  . WISDOM TOOTH EXTRACTION      FAMHx:  Family History  Adopted: Yes    SOCHx:   reports that he has been smoking cigarettes. He has a 25.00 pack-year smoking history. He has never used smokeless tobacco. He reports current alcohol use of about 35.0 standard drinks of alcohol per week. He reports that he does not use drugs.  ALLERGIES:  Allergies  Allergen Reactions  .  Zetia [Ezetimibe] Nausea And Vomiting    MEDS:  Current Meds  Medication Sig  . allopurinol (ZYLOPRIM) 100 MG tablet Take 1 tablet (100 mg total) by mouth daily.  Marland Kitchen ALPRAZolam (XANAX) 0.5 MG tablet TAKE 1 TABLET BY MOUTH THREE TIMES A DAY AS NEEDED FOR ANXIETY  . fenofibrate (TRICOR) 145 MG tablet Take 1 tablet (145 mg total) by mouth daily.  Marland Kitchen icosapent Ethyl (VASCEPA) 1 g capsule Take 2 capsules (2 g total) by mouth 2 (two) times daily.  Marland Kitchen losartan (COZAAR) 100 MG tablet TAKE 1 TABLET BY MOUTH EVERY DAY  . rosuvastatin (CRESTOR) 40 MG tablet Take 1 tablet (40 mg total) by mouth daily. Patient needs ov for further refills  . [DISCONTINUED] VASCEPA 1 g CAPS Take 2 capsules by mouth twice daily     ROS: Pertinent items noted in HPI and remainder of comprehensive ROS otherwise negative.  Labs/Other Tests and Data Reviewed:    Recent Labs: 02/22/2019: ALT 12; BUN 7; Creatinine, Ser 1.15; Hemoglobin 14.8; Platelets 339.0; Potassium 4.0; Sodium 138   Recent Lipid Panel Lab Results  Component Value Date/Time   CHOL 120 10/20/2019 12:12 PM   TRIG 232 (H) 10/20/2019 12:12 PM   HDL 34 (L) 10/20/2019 12:12 PM   CHOLHDL 3.5 10/20/2019 12:12 PM   CHOLHDL 4 02/22/2019 02:44 PM   LDLCALC 49 10/20/2019 12:12 PM   LDLDIRECT 48 10/20/2019 12:12 PM   LDLDIRECT 58.0 02/22/2019 02:44 PM    Wt Readings from Last 3 Encounters:  11/11/19 264 lb (119.7 kg)  07/27/19 261 lb (118.4 kg)  03/08/19 258 lb 1.6 oz (117.1 kg)     Exam:    Vital Signs:  BP 130/80   Pulse 84   Temp 97.9 F (36.6 C)   Ht 6\' 1"  (1.854 m)   Wt 264 lb (119.7 kg)   BMI 34.83 kg/m    Deferred  ASSESSMENT & PLAN:    1. Secondary hypertriglyceridemia 2. Heavy etoh abuse 3. Tobacco abuse 4. Anxiety/depression - ?PTSD  Daniel Butler has had continued reduction in triglycerides now at 232.  He is compliant with his medications.  He has decreased alcohol use however continues to use it.  He has made some dietary  changes and increase fish in his diet.  Overall he seems to be doing well.  We will plan repeat lipids and follow-up with me annually or sooner as necessary.  COVID-19 Education: The signs and symptoms of COVID-19 were discussed with the patient and how to seek care for testing (follow up with PCP or arrange E-visit).  The importance of social distancing was discussed today.  Patient Risk:   After full review of this patients clinical status, I feel that they are at least moderate risk at this time.  Time:   Today, I have spent 25 minutes with the patient with telehealth technology discussing dyslipidemia, alcohol cessation.  Medication Adjustments/Labs and Tests Ordered: Current medicines are reviewed at length with the patient today.  Concerns regarding medicines are outlined above.   Tests Ordered: No orders of the defined types were placed in this encounter.   Medication Changes: Meds ordered this encounter  Medications  . icosapent Ethyl (VASCEPA) 1 g capsule    Sig: Take 2 capsules (2 g total) by mouth 2 (two) times daily.    Dispense:  120 capsule    Refill:  11    Disposition:  in 1 year(s)  Pixie Casino, MD, [facility], [place] Director of the Advanced Lipid Disorders &  Cardiovascular Risk Reduction Clinic Diplomate of the American Board of Clinical Lipidology Attending Cardiologist  Direct Dial: [phone]  Fax: [phone]  Website:  [email or web address]  Pixie Casino, MD  11/11/2019 12:28 PM

## 2019-11-11 NOTE — Patient Instructions (Addendum)
Medication Instructions:  Your physician recommends that you continue on your current medications as directed. Please refer to the Current Medication list given to you today.  *If you need a refill on your cardiac medications before your next appointment, please call your pharmacy*  Co-Pay assistance (up to $2500/year if you qualify) healthwellfoundation.org >> disease funds >> hypercholesterolemia   Lab Work: FASTING lab work before next visit in 1 year (before your next visit) If you have labs (blood work) drawn today and your tests are completely normal, you will receive your results only by: Marland Kitchen MyChart Message (if you have MyChart) OR . A paper copy in the mail If you have any lab test that is abnormal or we need to change your treatment, we will call you to review the results.   Testing/Procedures: NONE   Follow-Up: At [facility], you and your health needs are our priority.  As part of our continuing mission to provide you with exceptional heart care, we have created designated Provider Care Teams.  These Care Teams include your primary Cardiologist (physician) and Advanced Practice Providers (APPs -  Physician Assistants and Nurse Practitioners) who all work together to provide you with the care you need, when you need it.  We recommend signing up for the patient portal called "MyChart".  Sign up information is provided on this After Visit Summary.  MyChart is used to connect with patients for Virtual Visits (Telemedicine).  Patients are able to view lab/test results, encounter notes, upcoming appointments, etc.  Non-urgent messages can be sent to your provider as well.   To learn more about what you can do with MyChart, go to [email or web address].    Your next appointment:   12 month(s)  The format for your next appointment:   In Person  Provider:    Dr. Zoila Shutter - lipid clinic    Other Instructions

## 2019-11-18 ENCOUNTER — Other Ambulatory Visit: Payer: Self-pay | Admitting: Family

## 2019-11-18 DIAGNOSIS — F411 Generalized anxiety disorder: Secondary | ICD-10-CM

## 2019-11-18 MED ORDER — ALPRAZOLAM 0.5 MG PO TABS: ORAL_TABLET | ORAL | 0 refills | Status: DC

## 2019-12-15 ENCOUNTER — Telehealth: Payer: Self-pay

## 2019-12-15 ENCOUNTER — Other Ambulatory Visit: Payer: Self-pay | Admitting: Internal Medicine

## 2019-12-15 NOTE — Telephone Encounter (Signed)
Refill for rosuvastatin 40mg  once daily sent to pts preferred pharmacy.

## 2019-12-15 NOTE — Telephone Encounter (Signed)
*  STAT* If patient is at the pharmacy, call can be transferred to refill team.   1. Which medications need to be refilled? (please list name of each medication and dose if known)  Rosuvastatin- need this today please so he can just make one trip to the pharmacy  2. Which pharmacy/location (including street and city if local pharmacy) is medication to be sent to? [facility]  3. Do they need a 30 day or 90 day supply? 90 days and refills

## 2019-12-16 ENCOUNTER — Other Ambulatory Visit: Payer: Self-pay | Admitting: Family

## 2019-12-16 DIAGNOSIS — F411 Generalized anxiety disorder: Secondary | ICD-10-CM

## 2020-01-17 ENCOUNTER — Other Ambulatory Visit: Payer: Self-pay | Admitting: Family

## 2020-01-17 DIAGNOSIS — F411 Generalized anxiety disorder: Secondary | ICD-10-CM

## 2020-02-15 ENCOUNTER — Other Ambulatory Visit: Payer: Self-pay | Admitting: Family

## 2020-02-15 DIAGNOSIS — F411 Generalized anxiety disorder: Secondary | ICD-10-CM

## 2020-03-13 ENCOUNTER — Other Ambulatory Visit: Payer: Self-pay | Admitting: Family

## 2020-03-13 DIAGNOSIS — F411 Generalized anxiety disorder: Secondary | ICD-10-CM

## 2020-03-14 ENCOUNTER — Other Ambulatory Visit: Payer: Self-pay | Admitting: Family

## 2020-03-14 ENCOUNTER — Encounter: Payer: Self-pay | Admitting: Family

## 2020-03-14 DIAGNOSIS — F411 Generalized anxiety disorder: Secondary | ICD-10-CM

## 2020-03-14 MED ORDER — ALPRAZOLAM 0.5 MG PO TABS: 0.5000 mg | ORAL_TABLET | Freq: Three times a day (TID) | ORAL | 0 refills | Status: DC | PRN

## 2020-03-14 NOTE — Telephone Encounter (Signed)
New message:   1.Medication Requested: ALPRAZolam (XANAX) 0.5 MG tablet 2. Pharmacy (Name, Street, [phone] [place], [place] - [address] [facility] 3. On Med List: yes  4. Last Visit with PCP: 07/27/19  5. Next visit date with PCP: None   Agent: Please be advised that RX refills may take up to 3 business days. We ask that you follow-up with your pharmacy.

## 2020-03-15 ENCOUNTER — Telehealth: Payer: Self-pay | Admitting: Family

## 2020-03-15 NOTE — Telephone Encounter (Signed)
   1.Medication Requested:losartan (COZAAR) 100 MG tablet/ 90 day supply  2. Pharmacy (Name, Street, [place]):[facility] [address] [place], [place] - [address]  3. On Med List: yes  4. Last Visit with PCP: 07/27/19  5. Next visit date with PCP: car trouble, will call back to schedule   Agent: Please be advised that RX refills may take up to 3 business days. We ask that you follow-up with your pharmacy.

## 2020-03-16 NOTE — Telephone Encounter (Signed)
Last Refill 10/28/19, but it looks like he should have some.  I called the patient to clarify if he needed this & he states that he "found" his meds; [facility].  Pt informed that when he needs refills of meds that pharmacy typically sends an electronic message to MD office with request.  Pt verb understands & confirms that he does not need the refill.

## 2020-04-10 ENCOUNTER — Other Ambulatory Visit: Payer: Self-pay | Admitting: Family

## 2020-04-10 DIAGNOSIS — F411 Generalized anxiety disorder: Secondary | ICD-10-CM

## 2020-04-11 NOTE — Telephone Encounter (Signed)
Daniel Butler called requesting update on his refill.  Patient states he is unable to wait until next week.

## 2020-04-11 NOTE — Telephone Encounter (Signed)
Refill has already been sent to Dr. Lawerance Bach to address.

## 2020-04-12 NOTE — Telephone Encounter (Signed)
Script sent in for patient. He has been informed it was sent in today.

## 2020-04-12 NOTE — Telephone Encounter (Signed)
New message:  Pt is calling to check on the status of his medication refill. Pt states he would also like a call due to not receiving one of yesterday. Please advise.

## 2020-04-14 NOTE — Telephone Encounter (Signed)
error 

## 2020-05-14 ENCOUNTER — Other Ambulatory Visit: Payer: Self-pay | Admitting: Internal Medicine

## 2020-05-14 DIAGNOSIS — F411 Generalized anxiety disorder: Secondary | ICD-10-CM

## 2020-06-05 ENCOUNTER — Other Ambulatory Visit: Payer: Self-pay | Admitting: Family

## 2020-06-05 DIAGNOSIS — I10 Essential (primary) hypertension: Secondary | ICD-10-CM

## 2020-06-12 ENCOUNTER — Other Ambulatory Visit: Payer: Self-pay | Admitting: Family

## 2020-06-12 DIAGNOSIS — F411 Generalized anxiety disorder: Secondary | ICD-10-CM

## 2020-07-11 ENCOUNTER — Telehealth: Payer: Self-pay | Admitting: Internal Medicine

## 2020-07-11 ENCOUNTER — Other Ambulatory Visit: Payer: Self-pay | Admitting: Physician Assistant

## 2020-07-11 DIAGNOSIS — E781 Pure hyperglyceridemia: Secondary | ICD-10-CM

## 2020-07-11 NOTE — Telephone Encounter (Signed)
New Message:     Pt says he needs a lab order mailed to him, so he can go to [facility] please.

## 2020-07-11 NOTE — Telephone Encounter (Signed)
Patient wanted to change his appointment to virtual and lab Rx to him home. Both done. He was appreciative of call.

## 2020-07-13 ENCOUNTER — Other Ambulatory Visit: Payer: Self-pay | Admitting: Family

## 2020-07-13 DIAGNOSIS — F411 Generalized anxiety disorder: Secondary | ICD-10-CM

## 2020-08-11 ENCOUNTER — Other Ambulatory Visit: Payer: Self-pay | Admitting: Family

## 2020-08-11 DIAGNOSIS — F411 Generalized anxiety disorder: Secondary | ICD-10-CM

## 2020-08-11 NOTE — Telephone Encounter (Signed)
Per controlled database, last RF: 07/14/20 Last OV: 07/27/19 Next OV: none scheduled

## 2020-08-30 ENCOUNTER — Other Ambulatory Visit: Payer: Self-pay | Admitting: Family

## 2020-08-30 DIAGNOSIS — E785 Hyperlipidemia, unspecified: Secondary | ICD-10-CM

## 2020-08-30 DIAGNOSIS — I10 Essential (primary) hypertension: Secondary | ICD-10-CM

## 2020-08-30 DIAGNOSIS — M1000 Idiopathic gout, unspecified site: Secondary | ICD-10-CM

## 2020-09-12 ENCOUNTER — Other Ambulatory Visit: Payer: Self-pay | Admitting: Family

## 2020-09-12 DIAGNOSIS — F411 Generalized anxiety disorder: Secondary | ICD-10-CM

## 2020-09-14 NOTE — Telephone Encounter (Signed)
Patient calling about his refill status, states he runs out after today

## 2020-09-15 NOTE — Telephone Encounter (Signed)
He was told in December with that refill that he needs an appointment for continued refills. I will give him this final prescription but he must make an appointment.

## 2020-09-15 NOTE — Telephone Encounter (Signed)
Notified pt w/Laura response. Appt made for 09/29/20.Marland KitchenRaechel Chute

## 2020-09-27 ENCOUNTER — Telehealth: Payer: Self-pay | Admitting: Family

## 2020-09-27 ENCOUNTER — Telehealth (INDEPENDENT_AMBULATORY_CARE_PROVIDER_SITE_OTHER): Payer: Self-pay | Admitting: Family

## 2020-09-27 ENCOUNTER — Other Ambulatory Visit: Payer: Self-pay

## 2020-09-27 DIAGNOSIS — J069 Acute upper respiratory infection, unspecified: Secondary | ICD-10-CM

## 2020-09-27 NOTE — Progress Notes (Signed)
Daniel Butler is a 55 y.o. male with the following history as recorded in EpicCare:  Patient Active Problem List   Diagnosis Date Noted  . Alcohol abuse 06/02/2018  . Tobacco abuse 06/02/2018  . Hypertriglyceridemia 01/19/2018  . Essential hypertension 10/02/2017  . Sciatica of right side 04/11/2017  . Sleep disturbance 05/19/2015  . Anxiety state 05/19/2015  . Generalized headaches 04/18/2015  . Gout 04/18/2015  . Encounter for orogastric (OG) tube placement   . Subarachnoid hemorrhage (HCC) 03/02/2015  . Acute respiratory failure with hypoxia (HCC)   . SAH (subarachnoid hemorrhage) (HCC)   . Subarachnoid bleed (HCC)   . Malignant hypertension     Current Outpatient Medications  Medication Sig Dispense Refill  . allopurinol (ZYLOPRIM) 100 MG tablet Take 1 tablet by mouth once daily 90 tablet 0  . ALPRAZolam (XANAX) 0.5 MG tablet TAKE 1 TABLET BY MOUTH THREE TIMES A DAY AS NEEDED FOR ANXIETY NEED APPT FOR REFILLS 90 tablet 0  . fenofibrate (TRICOR) 145 MG tablet Take 1 tablet by mouth once daily 90 tablet 0  . icosapent Ethyl (VASCEPA) 1 g capsule Take 2 capsules (2 g total) by mouth 2 (two) times daily. 120 capsule 11  . losartan (COZAAR) 100 MG tablet Take 1 tablet by mouth once daily 90 tablet 0  . rosuvastatin (CRESTOR) 40 MG tablet TAKE 1 TABLET BY MOUTH ONCE DAILY. PATIENT NEEDS OV FOR FURTHER REFILLS 90 tablet 3   No current facility-administered medications for this visit.    Allergies: Zetia [ezetimibe]  Past Medical History:  Diagnosis Date  . Brain aneurysm   . Chicken pox   . Depression   . Gout   . High cholesterol   . Hypertension     Past Surgical History:  Procedure Laterality Date  . CRANIOTOMY Right 03/02/2015   Procedure: CRANIOTOMY INTRACRANIAL  ANEURYSM FOR CLIPPING;  Surgeon: Lisbeth Renshaw, MD;  Location: [facility];  Service: Neurosurgery;  Laterality: Right;  . IR GENERIC HISTORICAL  02/16/2016   IR ANGIO VERTEBRAL SEL VERTEBRAL BILAT  MOD SED 02/16/2016 Lisbeth Renshaw, MD [facility]  . IR GENERIC HISTORICAL  02/16/2016   IR ANGIO INTRA EXTRACRAN SEL INTERNAL CAROTID BILAT MOD SED 02/16/2016 Lisbeth Renshaw, MD [facility]  . RADIOLOGY WITH ANESTHESIA N/A 03/02/2015   Procedure: RADIOLOGY WITH ANESTHESIA;  Surgeon: Lisbeth Renshaw, MD;  Location: [facility];  Service: Radiology;  Laterality: N/A;  . WISDOM TOOTH EXTRACTION      Family History  Adopted: Yes    Social History   Tobacco Use  . Smoking status: Current Every Day Smoker    Packs/day: 1.00    Years: 25.00    Pack years: 25.00    Types: Cigarettes  . Smokeless tobacco: Never Used  Substance Use Topics  . Alcohol use: Yes    Alcohol/week: 35.0 standard drinks    Types: 35 Cans of beer per week    Subjective:   I connected with Denese Killings on 09/27/20 at  2:00 PM EST by a telephone call and verified that I am speaking with the correct person using two identifiers.   I discussed the limitations of evaluation and management by telemedicine and the availability of in person appointments. The patient expressed understanding and agreed to proceed. Provider in office/ patient is at home; provider and patient are only 2 people on telephone call.   1 day history of body aches/ cough/ congestion/ low-grade fever ( 99.5); not vaccinated against flu or COVID; not  taking anything for symptoms;      Objective:  There were no vitals filed for this visit.  Lungs: Respirations unlabored;  Neurologic: Alert and oriented; speech intact; face symmetrical;   Assessment:  1. Viral URI     Plan:  Offered flu and COVID testing- patient defers; he is encouraged to quarantine at home and symptomatic treatment discussed; He understands he will have to be seen in the office in the next month for continued refills of chronic medications.  Time spent 8 minutes  No follow-ups on file.  No orders of the defined types were placed in this encounter.   Requested  Prescriptions    No prescriptions requested or ordered in this encounter

## 2020-09-27 NOTE — Telephone Encounter (Signed)
Patient wondering if his follow up appointment on Friday could be changed to virtual, states "just feel like having it virtual". Let me know if this is okay. Thanks

## 2020-09-27 NOTE — Telephone Encounter (Signed)
Pt states he is having body aches, chills, cough, ha/ fever 99.9, 135/75; symptoms began last evening. Will schedule virtual visit with PCP for today at 2p.  Pt given instructions on how to start visit & verb understanding.

## 2020-09-29 ENCOUNTER — Ambulatory Visit: Payer: Self-pay | Admitting: Family

## 2020-10-09 ENCOUNTER — Telehealth: Payer: Self-pay | Admitting: Family

## 2020-10-09 NOTE — Telephone Encounter (Signed)
1.Medication Requested:ALPRAZolam (XANAX) 0.5 MG tablet    2. Pharmacy (Name, Street, [phone] [place], [place] - [address] [facility]  3. On Med List: yes   4. Last Visit with PCP: 1.19.22  5. Next visit date with PCP: 2.11.22   Agent: Please be advised that RX refills may take up to 3 business days. We ask that you follow-up with your pharmacy.

## 2020-10-10 NOTE — Telephone Encounter (Signed)
Pt notified that Xanx cannot be refilled until 2/7.  Pt states that's why he was calling early.  Educated that his pharmacy will likely reach out to PCP when refill is due. Pt verb understanding stating that he has OV with PCP on 2/11 & wants it refilled before then.

## 2020-10-10 NOTE — Telephone Encounter (Signed)
His Xanax cannot be refilled before 2/7;

## 2020-10-13 ENCOUNTER — Telehealth: Payer: Self-pay | Admitting: Family

## 2020-10-13 DIAGNOSIS — F411 Generalized anxiety disorder: Secondary | ICD-10-CM

## 2020-10-13 NOTE — Telephone Encounter (Signed)
He was already told earlier this week that refill would not be done until 2/7. He is asking too early for the refills.

## 2020-10-13 NOTE — Telephone Encounter (Signed)
Last RF per controlled substance database: 09/15/20 Last OV: Video visit 09/27/20; last in person OV 07/27/19 Next OV: 10/20/20

## 2020-10-16 ENCOUNTER — Other Ambulatory Visit: Payer: Self-pay | Admitting: Family

## 2020-10-16 DIAGNOSIS — F411 Generalized anxiety disorder: Secondary | ICD-10-CM

## 2020-10-16 MED ORDER — ALPRAZOLAM 0.5 MG PO TABS: ORAL_TABLET | ORAL | 0 refills | Status: DC

## 2020-10-16 NOTE — Telephone Encounter (Signed)
1.Medication Requested: ALPRAZolam (XANAX) 0.5 MG tablet    2. Pharmacy (Name, Street, [phone] [facility] [place], [place] - [address] [facility]  3. On Med List: yes   4. Last Visit with PCP: 1.19.22  5. Next visit date with PCP: 2.11.22  Patient is requesting a call back [phone].    Agent: Please be advised that RX refills may take up to 3 business days. We ask that you follow-up with your pharmacy.

## 2020-10-20 ENCOUNTER — Ambulatory Visit (INDEPENDENT_AMBULATORY_CARE_PROVIDER_SITE_OTHER): Payer: Self-pay | Admitting: Family

## 2020-10-20 ENCOUNTER — Other Ambulatory Visit: Payer: Self-pay

## 2020-10-20 ENCOUNTER — Encounter: Payer: Self-pay | Admitting: Family

## 2020-10-20 VITALS — BP 148/94 | HR 86 | Temp 98.1°F | Ht 73.0 in | Wt 281.0 lb

## 2020-10-20 DIAGNOSIS — R42 Dizziness and giddiness: Secondary | ICD-10-CM

## 2020-10-20 DIAGNOSIS — I10 Essential (primary) hypertension: Secondary | ICD-10-CM

## 2020-10-20 DIAGNOSIS — R159 Full incontinence of feces: Secondary | ICD-10-CM

## 2020-10-20 DIAGNOSIS — I609 Nontraumatic subarachnoid hemorrhage, unspecified: Secondary | ICD-10-CM

## 2020-10-20 DIAGNOSIS — E785 Hyperlipidemia, unspecified: Secondary | ICD-10-CM

## 2020-10-20 NOTE — Progress Notes (Signed)
Daniel Butler is a 55 y.o. male with the following history as recorded in EpicCare:  Patient Active Problem List   Diagnosis Date Noted  . Alcohol abuse 06/02/2018  . Tobacco abuse 06/02/2018  . Hypertriglyceridemia 01/19/2018  . Essential hypertension 10/02/2017  . Sciatica of right side 04/11/2017  . Sleep disturbance 05/19/2015  . Anxiety state 05/19/2015  . Generalized headaches 04/18/2015  . Gout 04/18/2015  . Encounter for orogastric (OG) tube placement   . Subarachnoid hemorrhage (HCC) 03/02/2015  . Acute respiratory failure with hypoxia (HCC)   . SAH (subarachnoid hemorrhage) (HCC)   . Subarachnoid bleed (HCC)   . Malignant hypertension     Current Outpatient Medications  Medication Sig Dispense Refill  . allopurinol (ZYLOPRIM) 100 MG tablet Take 1 tablet by mouth once daily 90 tablet 0  . ALPRAZolam (XANAX) 0.5 MG tablet TAKE 1 TABLET BY MOUTH THREE TIMES A DAY AS NEEDED FOR ANXIETY 90 tablet 0  . fenofibrate (TRICOR) 145 MG tablet Take 1 tablet by mouth once daily 90 tablet 0  . icosapent Ethyl (VASCEPA) 1 g capsule Take 2 capsules (2 g total) by mouth 2 (two) times daily. 120 capsule 11  . losartan (COZAAR) 100 MG tablet Take 1 tablet by mouth once daily 90 tablet 0  . rosuvastatin (CRESTOR) 40 MG tablet TAKE 1 TABLET BY MOUTH ONCE DAILY. PATIENT NEEDS OV FOR FURTHER REFILLS 90 tablet 3   No current facility-administered medications for this visit.    Allergies: Zetia [ezetimibe]  Past Medical History:  Diagnosis Date  . Brain aneurysm   . Chicken pox   . Depression   . Gout   . High cholesterol   . Hypertension     Past Surgical History:  Procedure Laterality Date  . CRANIOTOMY Right 03/02/2015   Procedure: CRANIOTOMY INTRACRANIAL  ANEURYSM FOR CLIPPING;  Surgeon: Lisbeth Renshaw, MD;  Location: [facility];  Service: Neurosurgery;  Laterality: Right;  . IR GENERIC HISTORICAL  02/16/2016   IR ANGIO VERTEBRAL SEL VERTEBRAL BILAT MOD SED 02/16/2016 Lisbeth Renshaw, MD [facility]  . IR GENERIC HISTORICAL  02/16/2016   IR ANGIO INTRA EXTRACRAN SEL INTERNAL CAROTID BILAT MOD SED 02/16/2016 Lisbeth Renshaw, MD [facility]  . RADIOLOGY WITH ANESTHESIA N/A 03/02/2015   Procedure: RADIOLOGY WITH ANESTHESIA;  Surgeon: Lisbeth Renshaw, MD;  Location: [facility] OR;  Service: Radiology;  Laterality: N/A;  . WISDOM TOOTH EXTRACTION      Family History  Adopted: Yes    Social History   Tobacco Use  . Smoking status: Current Every Day Smoker    Packs/day: 1.00    Years: 25.00    Pack years: 25.00    Types: Cigarettes  . Smokeless tobacco: Never Used  Substance Use Topics  . Alcohol use: Yes    Alcohol/week: 35.0 standard drinks    Types: 35 Cans of beer per week    Subjective:   Presents for follow-up on hypertension and anxiety; patient's wife is present for visit today; overdue for labs- prefers not to update today;  Checks his blood pressure at home- always normal; admits he is stressed today;  Has gained 20 pounds in the past year- "just sitting around and not doing anything." Planning to see his cardiologist in follow-up in March;  Concerned about persisting vertigo- same symptoms discussed in 2020- saw ENT but no source found there; was encouraged to see his neurologist in November 2020 for persisting symptoms but no follow-up appears to have happened.  Objective:  Vitals:   10/20/20 1255  BP: (!) 148/94  Pulse: 86  Temp: 98.1 F (36.7 C)  TempSrc: Oral  SpO2: 97%  Weight: 281 lb (127.5 kg)  Height: 6\' 1"  (1.854 m)    General: Well developed, well nourished, in no acute distress  Skin : Warm and dry.  Head: Normocephalic and atraumatic  Eyes: Sclera and conjunctiva clear; pupils round and reactive to light; extraocular movements intact  Ears: External normal; canals clear; tympanic membranes normal  Oropharynx: Pink, supple. No suspicious lesions  Neck: Supple without thyromegaly, adenopathy  Lungs: Respirations unlabored;  clear to auscultation bilaterally without wheeze, rales, rhonchi  CVS exam: normal rate and regular rhythm.  Neurologic: Alert and oriented; speech intact; face symmetrical; moves all extremities well; CNII-XII intact without focal deficit   Assessment:  1. Vertigo   2. Incontinence of feces, unspecified fecal incontinence type   3. Essential hypertension   4. Hyperlipidemia, unspecified hyperlipidemia type   5. SAH (subarachnoid hemorrhage) (HCC)     Plan:  1. Referral to neurology; he is encouraged to discuss symptoms with his cardiologist at upcoming visit as well- they may ask him to wear a holter monitor;  2. Refer to GI- patient has never had colonoscopy; 3. Per patient, very nervous today; states blood pressure is stable at home; 4. Keep planned visit with cardiology; 5. ? If complications from surgery causing dizziness, needs to discuss further with neurology;  He defers any labs today for further evaluation; requests continued refills from our office for Xanax and Losartan;  Time spent 30 minutes  This visit occurred during the SARS-CoV-2 public health emergency.  Safety protocols were in place, including screening questions prior to the visit, additional usage of staff PPE, and extensive cleaning of exam room while observing appropriate contact time as indicated for disinfecting solutions.     No follow-ups on file.  Orders Placed This Encounter  Procedures  . Ambulatory referral to Neurology    Referral Priority:   Routine    Referral Type:   Consultation    Referral Reason:   Specialty Services Required    Requested Specialty:   Neurology    Number of Visits Requested:   1  . Ambulatory referral to Gastroenterology    Referral Priority:   Routine    Referral Type:   Consultation    Referral Reason:   Specialty Services Required    Number of Visits Requested:   1    Requested Prescriptions    No prescriptions requested or ordered in this encounter

## 2020-10-24 ENCOUNTER — Encounter: Payer: Self-pay | Admitting: Physician Assistant

## 2020-10-29 ENCOUNTER — Encounter: Payer: Self-pay | Admitting: Family

## 2020-11-07 DIAGNOSIS — K469 Unspecified abdominal hernia without obstruction or gangrene: Secondary | ICD-10-CM

## 2020-11-07 HISTORY — DX: Unspecified abdominal hernia without obstruction or gangrene: K46.9

## 2020-11-09 ENCOUNTER — Ambulatory Visit: Payer: Self-pay | Admitting: Physician Assistant

## 2020-11-10 ENCOUNTER — Other Ambulatory Visit: Payer: Self-pay | Admitting: Family

## 2020-11-10 DIAGNOSIS — F411 Generalized anxiety disorder: Secondary | ICD-10-CM

## 2020-11-14 ENCOUNTER — Encounter: Payer: Self-pay | Admitting: Family

## 2020-11-14 LAB — LIPID PANEL WITH LDL/HDL RATIO
Cholesterol, Total: 107 mg/dL (ref 100–199)
HDL: 33 mg/dL — ABNORMAL LOW (ref >39)
LDL Chol Calc (NIH): 38 mg/dL (ref 0–99)
LDL/HDL Ratio: 1.2 ratio (ref 0.0–3.6)
Triglycerides: 233 mg/dL — ABNORMAL HIGH (ref 0–149)
VLDL Cholesterol Cal: 36 mg/dL (ref 5–40)

## 2020-11-20 ENCOUNTER — Encounter: Payer: Self-pay | Admitting: Internal Medicine

## 2020-11-20 ENCOUNTER — Telehealth (INDEPENDENT_AMBULATORY_CARE_PROVIDER_SITE_OTHER): Payer: Self-pay | Admitting: Internal Medicine

## 2020-11-20 ENCOUNTER — Telehealth: Payer: Self-pay | Admitting: Licensed Clinical Social Worker

## 2020-11-20 VITALS — BP 135/80 | HR 82 | Temp 97.9°F | Ht 73.0 in | Wt 276.0 lb

## 2020-11-20 DIAGNOSIS — E781 Pure hyperglyceridemia: Secondary | ICD-10-CM

## 2020-11-20 DIAGNOSIS — F1010 Alcohol abuse, uncomplicated: Secondary | ICD-10-CM

## 2020-11-20 NOTE — Progress Notes (Signed)
Virtual Visit via Video Note   This visit type was conducted due to national recommendations for restrictions regarding the COVID-19 Pandemic (e.g. social distancing) in an effort to limit this patient's exposure and mitigate transmission in our community.  Due to his co-morbid illnesses, this patient is at least at moderate risk for complications without adequate follow up.  This format is felt to be most appropriate for this patient at this time.  All issues noted in this document were discussed and addressed.  A limited physical exam was performed with this format.  Please refer to the patient's chart for his consent to telehealth for [facility].   Evaluation Performed:  Video visit  Date:  11/20/2020   ID:  Daniel Butler, DOB 1966-02-15, MRN [id number]  Patient Location:  [address] [place] [address]  Provider location:   [address], [address] [place], [place] [address]  PCP:  Olive Bass, FNP  Cardiologist:  No primary care provider on file. Electrophysiologist:  None   Chief Complaint:  No complaints  History of Present Illness:    Daniel Butler is a 55 y.o. male who presents via audio/video conferencing for a telehealth visit today.  Daniel Butler was seen today for telehealth follow-up.  Overall he seems to be doing well.  He reports she is cut back on his alcohol use significantly.  He has been doing well on statin therapy in addition to his Vascepa.  He thinks the Vascepa may be causing some loose stools.  Overall though his lipid profile significantly improved.  Labs indicate his total cholesterol is decreased from 212-120.  Triglycerides have come down from 585-2 69.  His direct LDL is now target of 58.  This is reduced from 148 5 months ago.  While I think the medications have been helpful, I do also think this is due to dietary changes and reduction in alcohol intake.  He is working with his therapist on this.  11/11/2019  Daniel Butler  is seen today for a telephone conference visit.  Overall he seems to be doing well.  He says he is continue to cut back on his alcohol intake.  His lipid profile is further improved.  Total cholesterol is now 120, triglycerides 232, HDL 34 and LDL 49.  Triglycerides a year ago were 585.  He is compliant with Vascepa.  Again he has decreased his alcohol intake however now just drinks light beers.  He is also tried to make some dietary changes.  He says he is eating a lot of fish.  11/20/2020  Daniel Butler is seen today in follow-up.  He continues to do well.  His lipids are stable if not improved compared to last year.  Total cholesterol is very low at 107, triglycerides 233, HDL 33 and LDL 38.  He reports his Tillman Sers is very expensive at $400 a month since he is on self-pay.  He had not notified us of the significant cost until now.  There are I think options to help him including potentially getting it directly through a mail order.  There is also a generic icospent ethyl but it does not have cardiovascular risk reduction.  The patient does not have symptoms concerning for COVID-19 infection (fever, chills, cough, or new SHORTNESS OF BREATH).    Prior CV studies:   The following studies were reviewed today:  Chart reviewed  PMHx:  Past Medical History:  Diagnosis Date  . Brain aneurysm   .  Chicken pox   . Depression   . Gout   . High cholesterol   . Hypertension     Past Surgical History:  Procedure Laterality Date  . CRANIOTOMY Right 03/02/2015   Procedure: CRANIOTOMY INTRACRANIAL  ANEURYSM FOR CLIPPING;  Surgeon: Lisbeth Renshaw, MD;  Location: [facility];  Service: Neurosurgery;  Laterality: Right;  . IR GENERIC HISTORICAL  02/16/2016   IR ANGIO VERTEBRAL SEL VERTEBRAL BILAT MOD SED 02/16/2016 Lisbeth Renshaw, MD [facility]  . IR GENERIC HISTORICAL  02/16/2016   IR ANGIO INTRA EXTRACRAN SEL INTERNAL CAROTID BILAT MOD SED 02/16/2016 Lisbeth Renshaw, MD [facility]  . RADIOLOGY  WITH ANESTHESIA N/A 03/02/2015   Procedure: RADIOLOGY WITH ANESTHESIA;  Surgeon: Lisbeth Renshaw, MD;  Location: [facility] OR;  Service: Radiology;  Laterality: N/A;  . WISDOM TOOTH EXTRACTION      FAMHx:  Family History  Adopted: Yes    SOCHx:   reports that he has been smoking cigarettes. He has a 25.00 pack-year smoking history. He has never used smokeless tobacco. He reports current alcohol use of about 35.0 standard drinks of alcohol per week. He reports that he does not use drugs.  ALLERGIES:  Allergies  Allergen Reactions  . Zetia [Ezetimibe] Nausea And Vomiting    MEDS:  Current Meds  Medication Sig  . allopurinol (ZYLOPRIM) 100 MG tablet Take 1 tablet by mouth once daily  . ALPRAZolam (XANAX) 0.5 MG tablet TAKE 1 TABLET BY MOUTH THREE TIMES A DAY AS NEEDED FOR ANXIETY  . fenofibrate (TRICOR) 145 MG tablet Take 1 tablet by mouth once daily  . icosapent Ethyl (VASCEPA) 1 g capsule Take 2 capsules (2 g total) by mouth 2 (two) times daily.  Marland Kitchen losartan (COZAAR) 100 MG tablet Take 1 tablet by mouth once daily  . rosuvastatin (CRESTOR) 40 MG tablet TAKE 1 TABLET BY MOUTH ONCE DAILY. PATIENT NEEDS OV FOR FURTHER REFILLS     ROS: Pertinent items noted in HPI and remainder of comprehensive ROS otherwise negative.  Labs/Other Tests and Data Reviewed:    Recent Labs: No results found for requested labs within last 8760 hours.   Recent Lipid Panel Lab Results  Component Value Date/Time   CHOL 107 11/13/2020 10:41 AM   TRIG 233 (H) 11/13/2020 10:41 AM   HDL 33 (L) 11/13/2020 10:41 AM   CHOLHDL 3.5 10/20/2019 12:12 PM   CHOLHDL 4 02/22/2019 02:44 PM   LDLCALC 38 11/13/2020 10:41 AM   LDLDIRECT 48 10/20/2019 12:12 PM   LDLDIRECT 58.0 02/22/2019 02:44 PM    Wt Readings from Last 3 Encounters:  11/20/20 276 lb (125.2 kg)  10/20/20 281 lb (127.5 kg)  11/11/19 264 lb (119.7 kg)     Exam:    Vital Signs:  BP 135/80   Pulse 82   Temp 97.9 F (36.6 C)   Ht 6\' 1"  (1.854 m)    Wt 276 lb (125.2 kg)   BMI 36.41 kg/m    General appearance: alert and no distress Lungs: no audible wheezing Abdomen: obese Extremities: extremities normal, atraumatic, no cyanosis or edema Skin: Skin color, texture, turgor normal. No rashes or lesions Neurologic: Grossly normal Psych: Pleasant  ASSESSMENT & PLAN:    1. Secondary hypertriglyceridemia 2. Heavy etoh abuse 3. Tobacco abuse 4. Anxiety/depression - ?PTSD  Daniel Butler continues to have improved cholesterol compared to prior years.  His triglycerides have been reduced by 50%.  The Vascepa is quite expensive for him.  We will investigate options to try  to get the cost down form.  No changes in his medicines today.  Follow-up with me annually with repeat lipids.  COVID-19 Education: The signs and symptoms of COVID-19 were discussed with the patient and how to seek care for testing (follow up with PCP or arrange E-visit).  The importance of social distancing was discussed today.  Patient Risk:   After full review of this patients clinical status, I feel that they are at least moderate risk at this time.  Time:   Today, I have spent 25 minutes with the patient with telehealth technology discussing dyslipidemia, alcohol cessation.     Medication Adjustments/Labs and Tests Ordered: Current medicines are reviewed at length with the patient today.  Concerns regarding medicines are outlined above.   Tests Ordered: Orders Placed This Encounter  Procedures  . Lipid Panel With LDL/HDL Ratio    Medication Changes: No orders of the defined types were placed in this encounter.   Disposition:  in 1 year(s)  Chrystie Nose, MD, [facility], FACP  [place]  [facility]  Medical Director of the Advanced Lipid Disorders &  Cardiovascular Risk Reduction Clinic Diplomate of the American Board of Clinical Lipidology Attending Cardiologist  Direct Dial: [phone]  Fax: [phone]  Website:   [email or web address]  Chrystie Nose, MD  11/20/2020 9:40 AM

## 2020-11-20 NOTE — Telephone Encounter (Signed)
Pt referred by clinic staff in regards to pt being uninsured and medication affordability specifically Vascepa. Unfortunately neither the medication nor a generic are available through the [facility] program. I have reached out to pharmacy team to see if any patient assistance program exists for that medication and I will f/u with pt after I speak with them.  Octavio Graves, MSW, LCSW [facility] Heart/Vascular Care Navigation  [phone]

## 2020-11-20 NOTE — Patient Instructions (Addendum)
Medication Instructions:  Your physician recommends that you continue on your current medications as directed. Please refer to the Current Medication list given to you today.  *If you need a refill on your cardiac medications before your next appointment, please call your pharmacy*  Lab Work: Your physician recommends that you return for lab work in: 1 year: lipid profile prior to visit with [name].  If you have labs (blood work) drawn today and your tests are completely normal, you will receive your results only by: Marland Kitchen MyChart Message (if you have MyChart) OR . A paper copy in the mail If you have any lab test that is abnormal or we need to change your treatment, we will call you to review the results.    Follow-Up: At [facility], you and your health needs are our priority.  As part of our continuing mission to provide you with exceptional heart care, we have created designated Provider Care Teams.  These Care Teams include your primary Cardiologist (physician) and Advanced Practice Providers (APPs -  Physician Assistants and Nurse Practitioners) who all work together to provide you with the care you need, when you need it.  We recommend signing up for the patient portal called "MyChart".  Sign up information is provided on this After Visit Summary.  MyChart is used to connect with patients for Virtual Visits (Telemedicine).  Patients are able to view lab/test results, encounter notes, upcoming appointments, etc.  Non-urgent messages can be sent to your provider as well.   To learn more about what you can do with MyChart, go to [email or web address].    Your next appointment:   12 month(s)  The format for your next appointment:   In Person  Provider:   K. Italy Hilty, MD   Other Instructions In the lipid clinic.

## 2020-11-21 ENCOUNTER — Telehealth: Payer: Self-pay | Admitting: Internal Medicine

## 2020-11-21 ENCOUNTER — Telehealth: Payer: Self-pay | Admitting: Licensed Clinical Social Worker

## 2020-11-21 NOTE — Progress Notes (Signed)
Heart and Vascular Care Navigation  11/21/2020  Daniel Butler 10-08-1965 [id number]  Reason for Referral:  uninsured                                                                                                  Assessment:               LCSW spoke with pt via telephone. Referred by RN staff as currently self-pay. LCSW was able to reach pt at [phone]. Introduced self, role, reason for call. Pt confirmed home address and PCP; he lives at home w/ his wife Daniel Butler and their cats, they do not have children. He shares that he does not currently work, his wife works part time. He hasnt hard insurance in the past decade or so. Pt shares that he is independently wealthy after winning the lottery and has invested well. He finds it is cheaper to pay medical bills rather than to pay for health insurance through the marketplace. He is interested in filing for disability. I shared I could send that information to him in the mail. Pt also shares that he sometimes has challenges getting to and from appointments when his wife is at work. I inquired if he is interested in assistance through [employer]. Pt is interested.       HRT/VAS Care Coordination    Patients Home Cardiology Office [facility]   Outpatient Care Team Social Worker   Social Worker Name: Daniel Butler [place], [phone]   Living arrangements for the past 2 months Single Family Home   Lives with: Spouse   Patient Current Insurance Coverage Self-Pay   Patient Has Concern With Paying Medical Bills No   Does Patient Have Prescription Coverage? No   Patient Prescription Assistance Programs Patient Assistance Programs   Patient Assistance Programs Medications Vascepa   Home Assistive Devices/Equipment None      Social History:                                                                             SDOH Screenings   Alcohol Screen: Not on file  Depression (PHQ2-9): Low  Risk   . PHQ-2 Score: 0  Financial Resource Strain: Low Risk   . Difficulty of Paying Living Expenses: Not hard at all  Food Insecurity: No Food Insecurity  . Worried About Programme researcher, broadcasting/film/video in the Last Year: Never true  . Ran Out of Food in the Last Year: Never true  Housing: Low Risk   . Last Housing Risk Score: 0  Physical Activity: Not on file  Social Connections: Not on file  Stress: Not on file  Tobacco Use: High Risk  . Smoking Tobacco Use: Current Every Day Smoker  . Smokeless Tobacco Use: Never Used  Transportation Needs: No Transportation  Needs  . Lack of Transportation (Medical): No  . Lack of Transportation (Non-Medical): No    SDOH Interventions: Financial Resources:  Corporate treasurer Interventions: Intervention Not Indicated  Food Insecurity:  Food Insecurity Interventions: Intervention Not Indicated  Housing Insecurity:  Housing Interventions: Intervention Not Indicated  Transportation:   Transportation Interventions: Retail banker    Other Care Navigation Interventions:     Provided Pharmacy assistance resources Patient Assistance Programs   Follow-up plan:   LCSW will mail information through to pt regarding how to file for disability benefits. I will also send in referral for pt to [employer]. LCSW will also mail through my card for any additional questions/concerns. No additional needs identified at this time.

## 2020-11-21 NOTE — Telephone Encounter (Signed)
Patient seen in office 11/20/20 by [name]  He is unable to afford vascepa Spoke with [facility] pharmacy and they will do investigation into cost for self-pay for this medication Provided verbal Rx for vascepa 1gm capsules - take 2 caps PO BID MyChart message sent to patient with update

## 2020-11-28 ENCOUNTER — Encounter: Payer: Self-pay | Admitting: Nurse Practitioner

## 2020-11-28 ENCOUNTER — Ambulatory Visit (INDEPENDENT_AMBULATORY_CARE_PROVIDER_SITE_OTHER): Payer: Self-pay | Admitting: Nurse Practitioner

## 2020-11-28 ENCOUNTER — Other Ambulatory Visit: Payer: Self-pay

## 2020-11-28 VITALS — BP 130/70 | HR 56 | Ht 73.0 in | Wt 267.0 lb

## 2020-11-28 DIAGNOSIS — Z1211 Encounter for screening for malignant neoplasm of colon: Secondary | ICD-10-CM

## 2020-11-28 DIAGNOSIS — R198 Other specified symptoms and signs involving the digestive system and abdomen: Secondary | ICD-10-CM

## 2020-11-28 MED ORDER — NA SULFATE-K SULFATE-MG SULF 17.5-3.13-1.6 GM/177ML PO SOLN: 1.0000 | Freq: Once | ORAL | 0 refills | Status: AC

## 2020-11-28 NOTE — Patient Instructions (Signed)
You have been scheduled for a colonoscopy. Please follow written instructions given to you at your visit today.  Please pick up your prep supplies at the pharmacy within the next 1-3 days. If you use inhalers (even only as needed), please bring them with you on the day of your procedure.   We have sent the following medications to your pharmacy for you to pick up at your convenience: Suprep   Please return to your primary care for your abnormal heart rate and rhythm. Please contact us if you are sent to a cardiologist for evaluation before the colonoscopy.

## 2020-11-28 NOTE — Progress Notes (Signed)
ASSESSMENT AND PLAN    # 55 yo male with several month history of white rectal discharge of unclear etiology. On exam today there was a small amount of fresh fecal matter perianally but patient had a bowel movement earlier today.  No obvious fistula or perianal abscess.  --He could be having mucoid discharge from internal hemorrhoids. Can evaluate further at time of screening colonoscopy.    # Colon cancer screening.  --Patient will be scheduled for a colonoscopy. The risks and benefits of colonoscopy with possible polypectomy / biopsies were discussed and the patient agrees to proceed.   # Chronic intermittent constipation managed with as needed Dulcolax.   # Abnormal heart rhythm.  --He is followed by Cardiology for arrhythmia and has an upcoming appt. I asked him to contact PCP in interim in case she wants an EKG  # History of right MCA aneurysm/subarachnoid hemorrhage in 2016 s/p right frontotemporal craniotomy for clipping.  Follow-up angiogram 1 year later showed no other aneurysms, AVMs or fistulas.    HISTORY OF PRESENT ILLNESS     Chief Complaint : fecal incontinence  Daniel Butler is a 55 y.o. male with past medical history significant for right MCA aneurysm and subarchnoid hemorrhage in 2016, PTSD, hyperlipidemia, HTN, hx of heavy Etoh use, vertigo, gout   Patient is new to the practice, referred by PCP for fecal incontinece. The leakage started about 6 months ago. It is not brown or bloody but rather white. He leaks a small amount of this fluid several times a day. No associated rectal pain or abdominal pain. No bowel incontinence. No urinary incontinence. Patient has had intermittent  constipation for several months and takes Dulcolax as needed. Marland Kitchen He complains of mild weakness in left upper and left lower extremities. He also struggles with vertigo. He is scheduled to see Neurologist soon.   Patient adopted so [facility] unknown.    PREVIOUS EVALUATIONS:  none  Past  Medical History:  Diagnosis Date  . Brain aneurysm   . Chicken pox   . Depression   . Gout   . High cholesterol   . Hypertension      Past Surgical History:  Procedure Laterality Date  . CRANIOTOMY Right 03/02/2015   Procedure: CRANIOTOMY INTRACRANIAL  ANEURYSM FOR CLIPPING;  Surgeon: Lisbeth Renshaw, MD;  Location: [facility];  Service: Neurosurgery;  Laterality: Right;  . IR GENERIC HISTORICAL  02/16/2016   IR ANGIO VERTEBRAL SEL VERTEBRAL BILAT MOD SED 02/16/2016 Lisbeth Renshaw, MD [facility]  . IR GENERIC HISTORICAL  02/16/2016   IR ANGIO INTRA EXTRACRAN SEL INTERNAL CAROTID BILAT MOD SED 02/16/2016 Lisbeth Renshaw, MD [facility]  . RADIOLOGY WITH ANESTHESIA N/A 03/02/2015   Procedure: RADIOLOGY WITH ANESTHESIA;  Surgeon: Lisbeth Renshaw, MD;  Location: [facility] OR;  Service: Radiology;  Laterality: N/A;  . WISDOM TOOTH EXTRACTION     Family History  Adopted: Yes   Social History   Tobacco Use  . Smoking status: Current Every Day Smoker    Packs/day: 1.00    Years: 25.00    Pack years: 25.00    Types: Cigarettes  . Smokeless tobacco: Never Used  Vaping Use  . Vaping Use: Never used  Substance Use Topics  . Alcohol use: Yes    Alcohol/week: 35.0 standard drinks    Types: 35 Cans of beer per week  . Drug use: No   Current Outpatient Medications  Medication Sig Dispense Refill  . allopurinol (ZYLOPRIM) 100 MG tablet  Take 1 tablet by mouth once daily 90 tablet 0  . ALPRAZolam (XANAX) 0.5 MG tablet TAKE 1 TABLET BY MOUTH THREE TIMES A DAY AS NEEDED FOR ANXIETY 90 tablet 0  . fenofibrate (TRICOR) 145 MG tablet Take 1 tablet by mouth once daily 90 tablet 0  . icosapent Ethyl (VASCEPA) 1 g capsule Take 2 capsules (2 g total) by mouth 2 (two) times daily. 120 capsule 11  . losartan (COZAAR) 100 MG tablet Take 1 tablet by mouth once daily 90 tablet 0  . rosuvastatin (CRESTOR) 40 MG tablet TAKE 1 TABLET BY MOUTH ONCE DAILY. PATIENT NEEDS OV FOR FURTHER REFILLS 90 tablet 3    No current facility-administered medications for this visit.   Allergies  Allergen Reactions  . Zetia [Ezetimibe] Nausea And Vomiting     Review of Systems: Positive for left upper and left lower extremity mild weakness. All other systems reviewed and negative except where noted in HPI.   PHYSICAL EXAM :    Wt Readings from Last 3 Encounters:  11/28/20 267 lb (121.1 kg)  11/20/20 276 lb (125.2 kg)  10/20/20 281 lb (127.5 kg)    BP 130/70   Pulse (!) 56   Ht 6\' 1"  (1.854 m)   Wt 267 lb (121.1 kg)   BMI 35.23 kg/m  Constitutional:  Pleasant obese male in no acute distress. Psychiatric: Normal mood and affect. Behavior is normal. EENT: Pupils normal.  Conjunctivae are normal. No scleral icterus. Neck supple.  Cardiovascular: Normal rate, irreg rhythm. No edema Pulmonary/chest: Effort normal and breath sounds normal. No wheezing, rales or rhonchi. Abdominal: Soft, protuberant, nontender. Umbilical hernia present. Bowel sounds active throughout. There are no masses palpable. No hepatomegaly. Neurological: Alert and oriented to person place and time. Skin: Skin is warm and dry. No rashes noted.  , NP  11/28/2020, 2:35 PM  Cc:  Referring Provider 11/30/2020,*

## 2020-11-29 NOTE — Progress Notes (Signed)
Assessment and plan reviewed 

## 2020-12-05 ENCOUNTER — Ambulatory Visit: Payer: Self-pay | Admitting: Family

## 2020-12-11 ENCOUNTER — Telehealth: Payer: Self-pay | Admitting: Internal Medicine

## 2020-12-11 ENCOUNTER — Telehealth: Payer: Self-pay | Admitting: Family

## 2020-12-11 ENCOUNTER — Other Ambulatory Visit: Payer: Self-pay | Admitting: Internal Medicine

## 2020-12-11 DIAGNOSIS — F411 Generalized anxiety disorder: Secondary | ICD-10-CM

## 2020-12-11 DIAGNOSIS — M1000 Idiopathic gout, unspecified site: Secondary | ICD-10-CM

## 2020-12-11 DIAGNOSIS — I10 Essential (primary) hypertension: Secondary | ICD-10-CM

## 2020-12-11 DIAGNOSIS — E785 Hyperlipidemia, unspecified: Secondary | ICD-10-CM

## 2020-12-11 MED ORDER — ALLOPURINOL 100 MG PO TABS: 100.0000 mg | ORAL_TABLET | Freq: Every day | ORAL | 1 refills | Status: DC

## 2020-12-11 MED ORDER — LOSARTAN POTASSIUM 100 MG PO TABS: 1.0000 | ORAL_TABLET | Freq: Every day | ORAL | 1 refills | Status: DC

## 2020-12-11 MED ORDER — ROSUVASTATIN CALCIUM 40 MG PO TABS: ORAL_TABLET | ORAL | 3 refills | Status: DC

## 2020-12-11 MED ORDER — FENOFIBRATE 145 MG PO TABS: 145.0000 mg | ORAL_TABLET | Freq: Every day | ORAL | 1 refills | Status: DC

## 2020-12-11 NOTE — Telephone Encounter (Signed)
Rx has been sent to pharmacy with the providers approval.

## 2020-12-11 NOTE — Telephone Encounter (Signed)
*  STAT* If patient is at the pharmacy, call can be transferred to refill team.   1. Which medications need to be refilled? (please list name of each medication and dose if known) rosuvastatin (CRESTOR) 40 MG tablet  2. Which pharmacy/location (including street and city if local pharmacy) is medication to be sent to? [facility] [address] [place], [place] - [address] [address]  3. Do they need a 30 day or 90 day supply? 90 day   Patient was seen 11/20/2020.

## 2020-12-11 NOTE — Telephone Encounter (Signed)
losartan (COZAAR) 100 MG tablet fenofibrate (TRICOR) 145 MG tablet allopurinol (ZYLOPRIM) 100 MG tablet\ [facility] [address] [place], [place] - [address] [address] [place] [phone] [address] Phone:  [phone]  Fax:  [phone]     Last seen- 02.11.22  Next apt- 04.11.22 Patient calling, requesting a refills for a year

## 2020-12-15 ENCOUNTER — Other Ambulatory Visit: Payer: Self-pay

## 2020-12-15 ENCOUNTER — Emergency Department (HOSPITAL_COMMUNITY): Payer: Self-pay

## 2020-12-15 ENCOUNTER — Encounter (HOSPITAL_COMMUNITY): Payer: Self-pay | Admitting: Internal Medicine

## 2020-12-15 ENCOUNTER — Inpatient Hospital Stay (HOSPITAL_COMMUNITY)
Admission: EM | Admit: 2020-12-15 | Discharge: 2020-12-23 | DRG: 286 | Disposition: A | Discharge status: Home / Self Care | Payer: Self-pay | Attending: Internal Medicine | Admitting: Internal Medicine

## 2020-12-15 ENCOUNTER — Inpatient Hospital Stay (HOSPITAL_COMMUNITY): Payer: Self-pay

## 2020-12-15 DIAGNOSIS — I110 Hypertensive heart disease with heart failure: Principal | ICD-10-CM | POA: Diagnosis present

## 2020-12-15 DIAGNOSIS — G9341 Metabolic encephalopathy: Secondary | ICD-10-CM | POA: Diagnosis present

## 2020-12-15 DIAGNOSIS — I5041 Acute combined systolic (congestive) and diastolic (congestive) heart failure: Secondary | ICD-10-CM

## 2020-12-15 DIAGNOSIS — F10229 Alcohol dependence with intoxication, unspecified: Secondary | ICD-10-CM | POA: Diagnosis present

## 2020-12-15 DIAGNOSIS — R1310 Dysphagia, unspecified: Secondary | ICD-10-CM | POA: Diagnosis not present

## 2020-12-15 DIAGNOSIS — K4090 Unilateral inguinal hernia, without obstruction or gangrene, not specified as recurrent: Secondary | ICD-10-CM | POA: Diagnosis present

## 2020-12-15 DIAGNOSIS — F419 Anxiety disorder, unspecified: Secondary | ICD-10-CM | POA: Diagnosis present

## 2020-12-15 DIAGNOSIS — R0902 Hypoxemia: Secondary | ICD-10-CM | POA: Diagnosis not present

## 2020-12-15 DIAGNOSIS — N179 Acute kidney failure, unspecified: Secondary | ICD-10-CM | POA: Diagnosis present

## 2020-12-15 DIAGNOSIS — Z6837 Body mass index (BMI) 37.0-37.9, adult: Secondary | ICD-10-CM

## 2020-12-15 DIAGNOSIS — Y907 Blood alcohol level of 200-239 mg/100 ml: Secondary | ICD-10-CM | POA: Diagnosis present

## 2020-12-15 DIAGNOSIS — R918 Other nonspecific abnormal finding of lung field: Secondary | ICD-10-CM | POA: Diagnosis present

## 2020-12-15 DIAGNOSIS — Z79899 Other long term (current) drug therapy: Secondary | ICD-10-CM

## 2020-12-15 DIAGNOSIS — Y92009 Unspecified place in unspecified non-institutional (private) residence as the place of occurrence of the external cause: Secondary | ICD-10-CM

## 2020-12-15 DIAGNOSIS — M25551 Pain in right hip: Secondary | ICD-10-CM | POA: Diagnosis present

## 2020-12-15 DIAGNOSIS — R7401 Elevation of levels of liver transaminase levels: Secondary | ICD-10-CM | POA: Diagnosis present

## 2020-12-15 DIAGNOSIS — I428 Other cardiomyopathies: Secondary | ICD-10-CM | POA: Diagnosis present

## 2020-12-15 DIAGNOSIS — R0603 Acute respiratory distress: Secondary | ICD-10-CM | POA: Diagnosis present

## 2020-12-15 DIAGNOSIS — R0602 Shortness of breath: Secondary | ICD-10-CM

## 2020-12-15 DIAGNOSIS — M109 Gout, unspecified: Secondary | ICD-10-CM | POA: Diagnosis present

## 2020-12-15 DIAGNOSIS — F10129 Alcohol abuse with intoxication, unspecified: Secondary | ICD-10-CM | POA: Diagnosis present

## 2020-12-15 DIAGNOSIS — I959 Hypotension, unspecified: Secondary | ICD-10-CM | POA: Diagnosis present

## 2020-12-15 DIAGNOSIS — F17210 Nicotine dependence, cigarettes, uncomplicated: Secondary | ICD-10-CM | POA: Diagnosis present

## 2020-12-15 DIAGNOSIS — E876 Hypokalemia: Secondary | ICD-10-CM | POA: Diagnosis not present

## 2020-12-15 DIAGNOSIS — F10231 Alcohol dependence with withdrawal delirium: Secondary | ICD-10-CM | POA: Diagnosis not present

## 2020-12-15 DIAGNOSIS — R5381 Other malaise: Secondary | ICD-10-CM | POA: Diagnosis present

## 2020-12-15 DIAGNOSIS — G928 Other toxic encephalopathy: Secondary | ICD-10-CM | POA: Diagnosis present

## 2020-12-15 DIAGNOSIS — W19XXXA Unspecified fall, initial encounter: Secondary | ICD-10-CM

## 2020-12-15 DIAGNOSIS — R296 Repeated falls: Secondary | ICD-10-CM | POA: Diagnosis present

## 2020-12-15 DIAGNOSIS — E875 Hyperkalemia: Secondary | ICD-10-CM | POA: Diagnosis not present

## 2020-12-15 DIAGNOSIS — I272 Pulmonary hypertension, unspecified: Secondary | ICD-10-CM | POA: Diagnosis present

## 2020-12-15 DIAGNOSIS — K429 Umbilical hernia without obstruction or gangrene: Secondary | ICD-10-CM | POA: Diagnosis present

## 2020-12-15 DIAGNOSIS — Z20822 Contact with and (suspected) exposure to covid-19: Secondary | ICD-10-CM | POA: Diagnosis present

## 2020-12-15 DIAGNOSIS — R600 Localized edema: Secondary | ICD-10-CM

## 2020-12-15 DIAGNOSIS — E785 Hyperlipidemia, unspecified: Secondary | ICD-10-CM | POA: Diagnosis present

## 2020-12-15 DIAGNOSIS — I4891 Unspecified atrial fibrillation: Secondary | ICD-10-CM | POA: Diagnosis present

## 2020-12-15 DIAGNOSIS — Z8679 Personal history of other diseases of the circulatory system: Secondary | ICD-10-CM

## 2020-12-15 DIAGNOSIS — E860 Dehydration: Secondary | ICD-10-CM | POA: Diagnosis present

## 2020-12-15 DIAGNOSIS — F1010 Alcohol abuse, uncomplicated: Secondary | ICD-10-CM

## 2020-12-15 DIAGNOSIS — E7800 Pure hypercholesterolemia, unspecified: Secondary | ICD-10-CM | POA: Diagnosis present

## 2020-12-15 DIAGNOSIS — I5043 Acute on chronic combined systolic (congestive) and diastolic (congestive) heart failure: Secondary | ICD-10-CM | POA: Diagnosis present

## 2020-12-15 DIAGNOSIS — E872 Acidosis: Secondary | ICD-10-CM | POA: Diagnosis present

## 2020-12-15 DIAGNOSIS — E8809 Other disorders of plasma-protein metabolism, not elsewhere classified: Secondary | ICD-10-CM | POA: Diagnosis present

## 2020-12-15 DIAGNOSIS — R4182 Altered mental status, unspecified: Secondary | ICD-10-CM

## 2020-12-15 DIAGNOSIS — Z8673 Personal history of transient ischemic attack (TIA), and cerebral infarction without residual deficits: Secondary | ICD-10-CM

## 2020-12-15 LAB — CBC WITH DIFFERENTIAL/PLATELET
Abs Immature Granulocytes: 0.04 10*3/uL (ref 0.00–0.07)
Basophils Absolute: 0 10*3/uL (ref 0.0–0.1)
Basophils Relative: 1 %
Eosinophils Absolute: 0.2 10*3/uL (ref 0.0–0.5)
Eosinophils Relative: 2 %
HCT: 42.9 % (ref 39.0–52.0)
Hemoglobin: 14 g/dL (ref 13.0–17.0)
Immature Granulocytes: 1 %
Lymphocytes Relative: 30 %
Lymphs Abs: 2 10*3/uL (ref 0.7–4.0)
MCH: 36.3 pg — ABNORMAL HIGH (ref 26.0–34.0)
MCHC: 32.6 g/dL (ref 30.0–36.0)
MCV: 111.1 fL — ABNORMAL HIGH (ref 80.0–100.0)
Monocytes Absolute: 0.6 10*3/uL (ref 0.1–1.0)
Monocytes Relative: 9 %
Neutro Abs: 3.8 10*3/uL (ref 1.7–7.7)
Neutrophils Relative %: 57 %
Platelets: 232 10*3/uL (ref 150–400)
RBC: 3.86 MIL/uL — ABNORMAL LOW (ref 4.22–5.81)
RDW: 14.6 % (ref 11.5–15.5)
WBC: 6.6 10*3/uL (ref 4.0–10.5)
nRBC: 0 % (ref 0.0–0.2)

## 2020-12-15 LAB — HEPATITIS PANEL, ACUTE
HCV Ab: NONREACTIVE
Hep A IgM: NONREACTIVE
Hep B C IgM: NONREACTIVE
Hepatitis B Surface Ag: NONREACTIVE

## 2020-12-15 LAB — URINALYSIS, ROUTINE W REFLEX MICROSCOPIC
Bilirubin Urine: NEGATIVE
Glucose, UA: NEGATIVE mg/dL
Hgb urine dipstick: NEGATIVE
Ketones, ur: NEGATIVE mg/dL
Leukocytes,Ua: NEGATIVE
Nitrite: NEGATIVE
Protein, ur: NEGATIVE mg/dL
Specific Gravity, Urine: 1.006 (ref 1.005–1.030)
pH: 6 (ref 5.0–8.0)

## 2020-12-15 LAB — PROTIME-INR
INR: 1.2 (ref 0.8–1.2)
Prothrombin Time: 14.4 seconds (ref 11.4–15.2)

## 2020-12-15 LAB — COMPREHENSIVE METABOLIC PANEL
ALT: 23 U/L (ref 0–44)
AST: 49 U/L — ABNORMAL HIGH (ref 15–41)
Albumin: 3.4 g/dL — ABNORMAL LOW (ref 3.5–5.0)
Alkaline Phosphatase: 26 U/L — ABNORMAL LOW (ref 38–126)
Anion gap: 8 (ref 5–15)
BUN: 23 mg/dL — ABNORMAL HIGH (ref 6–20)
CO2: 24 mmol/L (ref 22–32)
Calcium: 8.3 mg/dL — ABNORMAL LOW (ref 8.9–10.3)
Chloride: 110 mmol/L (ref 98–111)
Creatinine, Ser: 2.08 mg/dL — ABNORMAL HIGH (ref 0.61–1.24)
GFR, Estimated: 37 mL/min — ABNORMAL LOW (ref >60)
Glucose, Bld: 108 mg/dL — ABNORMAL HIGH (ref 70–99)
Potassium: 3.7 mmol/L (ref 3.5–5.1)
Sodium: 142 mmol/L (ref 135–145)
Total Bilirubin: 0.8 mg/dL (ref 0.3–1.2)
Total Protein: 5.9 g/dL — ABNORMAL LOW (ref 6.5–8.1)

## 2020-12-15 LAB — BRAIN NATRIURETIC PEPTIDE
B Natriuretic Peptide: 107.5 pg/mL — ABNORMAL HIGH (ref 0.0–100.0)
B Natriuretic Peptide: 129.6 pg/mL — ABNORMAL HIGH (ref 0.0–100.0)

## 2020-12-15 LAB — RESP PANEL BY RT-PCR (FLU A&B, COVID) ARPGX2
Influenza A by PCR: NEGATIVE
Influenza B by PCR: NEGATIVE
SARS Coronavirus 2 by RT PCR: NEGATIVE

## 2020-12-15 LAB — MAGNESIUM: Magnesium: 2.2 mg/dL (ref 1.7–2.4)

## 2020-12-15 LAB — APTT: aPTT: 27 seconds (ref 24–36)

## 2020-12-15 LAB — LACTIC ACID, PLASMA
Lactic Acid, Venous: 1.9 mmol/L (ref 0.5–1.9)
Lactic Acid, Venous: 3.1 mmol/L (ref 0.5–1.9)

## 2020-12-15 LAB — ETHANOL: Alcohol, Ethyl (B): 237 mg/dL — ABNORMAL HIGH (ref <10)

## 2020-12-15 LAB — HIV ANTIBODY (ROUTINE TESTING W REFLEX): HIV Screen 4th Generation wRfx: NONREACTIVE

## 2020-12-15 LAB — TSH: TSH: 2.377 u[IU]/mL (ref 0.350–4.500)

## 2020-12-15 LAB — D-DIMER, QUANTITATIVE: D-Dimer, Quant: 0.61 ug/mL-FEU — ABNORMAL HIGH (ref 0.00–0.50)

## 2020-12-15 LAB — CK: Total CK: 377 U/L (ref 49–397)

## 2020-12-15 LAB — AMMONIA: Ammonia: 33 umol/L (ref 9–35)

## 2020-12-15 LAB — PHOSPHORUS: Phosphorus: 3.3 mg/dL (ref 2.5–4.6)

## 2020-12-15 MED ORDER — THIAMINE HCL 100 MG/ML IJ SOLN
Freq: Once | INTRAVENOUS | Status: DC
  Filled 2020-12-15: qty 1000

## 2020-12-15 MED ORDER — ROSUVASTATIN CALCIUM 20 MG PO TABS
40.0000 mg | ORAL_TABLET | Freq: Every day | ORAL | Status: DC
  Administered 2020-12-16 – 2020-12-23 (×8): 40 mg via ORAL
  Filled 2020-12-15 (×8): qty 2

## 2020-12-15 MED ORDER — THIAMINE HCL 100 MG/ML IJ SOLN
Freq: Once | INTRAVENOUS | Status: AC
  Filled 2020-12-15 (×2): qty 1000

## 2020-12-15 MED ORDER — AMIODARONE HCL IN DEXTROSE 360-4.14 MG/200ML-% IV SOLN
30.0000 mg/h | INTRAVENOUS | Status: DC
  Administered 2020-12-15 – 2020-12-18 (×7): 30 mg/h via INTRAVENOUS
  Filled 2020-12-15 (×7): qty 200

## 2020-12-15 MED ORDER — THIAMINE HCL 100 MG/ML IJ SOLN
100.0000 mg | Freq: Every day | INTRAMUSCULAR | Status: DC
  Administered 2020-12-17 – 2020-12-19 (×3): 100 mg via INTRAVENOUS
  Filled 2020-12-15 (×3): qty 2

## 2020-12-15 MED ORDER — ADULT MULTIVITAMIN W/MINERALS CH
1.0000 | ORAL_TABLET | Freq: Every day | ORAL | Status: DC
  Administered 2020-12-16 – 2020-12-23 (×8): 1 via ORAL
  Filled 2020-12-15 (×9): qty 1

## 2020-12-15 MED ORDER — SODIUM CHLORIDE 0.9 % IV SOLN
1.0000 g | Freq: Once | INTRAVENOUS | Status: AC
  Administered 2020-12-15: 1 g via INTRAVENOUS
  Filled 2020-12-15: qty 10

## 2020-12-15 MED ORDER — FOLIC ACID 1 MG PO TABS
1.0000 mg | ORAL_TABLET | Freq: Every day | ORAL | Status: DC
  Administered 2020-12-16 – 2020-12-23 (×8): 1 mg via ORAL
  Filled 2020-12-15 (×8): qty 1

## 2020-12-15 MED ORDER — VANCOMYCIN HCL 2000 MG/400ML IV SOLN
2000.0000 mg | Freq: Once | INTRAVENOUS | Status: AC
  Administered 2020-12-15: 2000 mg via INTRAVENOUS
  Filled 2020-12-15: qty 400

## 2020-12-15 MED ORDER — ALBUTEROL SULFATE (2.5 MG/3ML) 0.083% IN NEBU: 2.5000 mg | INHALATION_SOLUTION | Freq: Four times a day (QID) | RESPIRATORY_TRACT | Status: DC | PRN

## 2020-12-15 MED ORDER — ICOSAPENT ETHYL 1 G PO CAPS
2.0000 g | ORAL_CAPSULE | Freq: Two times a day (BID) | ORAL | Status: DC
  Administered 2020-12-16 – 2020-12-23 (×14): 2 g via ORAL
  Filled 2020-12-15 (×17): qty 2

## 2020-12-15 MED ORDER — LORAZEPAM 2 MG/ML IJ SOLN
0.0000 mg | Freq: Four times a day (QID) | INTRAMUSCULAR | Status: AC
  Administered 2020-12-16: 2 mg via INTRAVENOUS
  Administered 2020-12-17 (×2): 1 mg via INTRAVENOUS
  Filled 2020-12-15 (×4): qty 1

## 2020-12-15 MED ORDER — LEVALBUTEROL HCL 1.25 MG/0.5ML IN NEBU
1.2500 mg | INHALATION_SOLUTION | Freq: Four times a day (QID) | RESPIRATORY_TRACT | Status: DC | PRN
  Administered 2020-12-15: 1.25 mg via RESPIRATORY_TRACT
  Filled 2020-12-15: qty 0.5

## 2020-12-15 MED ORDER — LORAZEPAM 1 MG PO TABS: 1.0000 mg | ORAL_TABLET | ORAL | Status: AC | PRN

## 2020-12-15 MED ORDER — SODIUM CHLORIDE 0.9 % IV SOLN
2.0000 g | Freq: Once | INTRAVENOUS | Status: AC
  Administered 2020-12-15: 2 g via INTRAVENOUS
  Filled 2020-12-15: qty 2

## 2020-12-15 MED ORDER — DILTIAZEM LOAD VIA INFUSION: 10.0000 mg | Freq: Once | INTRAVENOUS | Status: DC

## 2020-12-15 MED ORDER — ENOXAPARIN SODIUM 60 MG/0.6ML ~~LOC~~ SOLN
60.0000 mg | SUBCUTANEOUS | Status: DC
  Administered 2020-12-15 – 2020-12-19 (×5): 60 mg via SUBCUTANEOUS
  Filled 2020-12-15 (×7): qty 0.6

## 2020-12-15 MED ORDER — THIAMINE HCL 100 MG PO TABS
100.0000 mg | ORAL_TABLET | Freq: Every day | ORAL | Status: DC
  Administered 2020-12-16: 100 mg via ORAL
  Filled 2020-12-15: qty 1

## 2020-12-15 MED ORDER — FENOFIBRATE 160 MG PO TABS
160.0000 mg | ORAL_TABLET | Freq: Every day | ORAL | Status: DC
  Administered 2020-12-16 – 2020-12-23 (×8): 160 mg via ORAL
  Filled 2020-12-15 (×8): qty 1

## 2020-12-15 MED ORDER — FUROSEMIDE 10 MG/ML IJ SOLN
40.0000 mg | Freq: Once | INTRAMUSCULAR | Status: AC
  Administered 2020-12-15: 40 mg via INTRAVENOUS
  Filled 2020-12-15: qty 4

## 2020-12-15 MED ORDER — DILTIAZEM HCL 25 MG/5ML IV SOLN
10.0000 mg | Freq: Once | INTRAVENOUS | Status: AC
  Administered 2020-12-15: 10 mg via INTRAVENOUS
  Filled 2020-12-15: qty 5

## 2020-12-15 MED ORDER — LORAZEPAM 2 MG/ML IJ SOLN
0.0000 mg | Freq: Two times a day (BID) | INTRAMUSCULAR | Status: AC
  Administered 2020-12-18 – 2020-12-19 (×2): 2 mg via INTRAVENOUS
  Filled 2020-12-15 (×2): qty 1

## 2020-12-15 MED ORDER — LORAZEPAM 2 MG/ML IJ SOLN
1.0000 mg | INTRAMUSCULAR | Status: AC | PRN
  Administered 2020-12-18: 1 mg via INTRAVENOUS
  Filled 2020-12-15: qty 1

## 2020-12-15 NOTE — Significant Event (Signed)
Rapid Response Event Note   Reason for Call :  Hypoxia and respiratory distress  Initial Focused Assessment:  Tachypneic, Afib RVR 120-140's placed on NRB by primary RN sats at 100% Diminished R lung sounds with expiratory wheezes and rhonchi throughout left lung. Patient coughing up light green/white thick sputum and states " I feel the fluid in my lungs"  Patient currently on Ami drip at 30mg /h     Interventions:  CXR  Plan of Care:  MD Chotiner ordered: Cardizem 10mg  IVP Lasix 40mg  IVP Xopenox BNP Reassess Event Summary:   MD Notified: 2136 Call Time: 2125 Arrival Time: 2126 End Time: 2200  2126, RN

## 2020-12-15 NOTE — ED Notes (Signed)
Patient is resting comfortably. 

## 2020-12-15 NOTE — ED Notes (Signed)
Attempted to give report to floor, asked to call back.

## 2020-12-15 NOTE — Progress Notes (Signed)
The patient had a critical lactic acid level of 3.1, MD notified. Daniel Queen, MD also notified that the patient is drowsy and with wet speech. Unsure if patient can tolerate food or drink. Holding oral medications at this time. The patient has been hypotensive and tachycardic- afib. Amiodarone drip is currenly running. See new orders.

## 2020-12-15 NOTE — Progress Notes (Signed)
Poor oral hygiene

## 2020-12-15 NOTE — ED Provider Notes (Signed)
[facility] EMERGENCY DEPARTMENT Provider Note   CSN: [id number] Arrival date & time: 12/15/20  1259     History Chief Complaint  Patient presents with  . Altered Mental Status    Patient brought in by EMS from home. Patients wife called, he has been progressively getting "sick" over the last 2 weeks with leg swelling and heavy drinking. Very lethargic at this time, EMS states that they improved the patients color with some fluids.   . Alcohol Intoxication    5 beers and two shots of wild [place]   . Fall    C-collar placed at this time.     Daniel Butler is a 55 y.o. male.  Patient brought in via EMS for altered mental status status post fall.  Was hypotensive in the field tachycardic reported to be in atrial fibrillation with RVR.  Patient also comments on alcohol intoxication as well as Xanax use.  Family history states the patient has been having memory issues for some time now.  He is also been very unstable and is a daily drinker.  They report no recent illness.  He is on no blood thinners.        Past Medical History:  Diagnosis Date  . Brain aneurysm   . Chicken pox   . Depression   . Gout   . High cholesterol   . Hypertension     Patient Active Problem List   Diagnosis Date Noted  . Alcohol abuse with intoxication (HCC) 12/15/2020  . Transient hypotension 12/15/2020  . Acute metabolic encephalopathy 12/15/2020  . AKI (acute kidney injury) (HCC) 12/15/2020  . History of subarachnoid hemorrhage 12/15/2020  . Hypoalbuminemia 12/15/2020  . New onset atrial fibrillation (HCC) 12/15/2020  . Bilateral lower extremity edema 12/15/2020  . Fall at home, initial encounter 12/15/2020  . Alcohol abuse 06/02/2018  . Tobacco abuse 06/02/2018  . Hypertriglyceridemia 01/19/2018  . Essential hypertension 10/02/2017  . Sciatica of right side 04/11/2017  . Sleep disturbance 05/19/2015  . Anxiety state 05/19/2015  . Generalized headaches 04/18/2015   . Gout 04/18/2015  . Encounter for orogastric (OG) tube placement   . Subarachnoid hemorrhage (HCC) 03/02/2015  . Acute respiratory failure with hypoxia (HCC)   . SAH (subarachnoid hemorrhage) (HCC)   . Subarachnoid bleed (HCC)   . Malignant hypertension     Past Surgical History:  Procedure Laterality Date  . CRANIOTOMY Right 03/02/2015   Procedure: CRANIOTOMY INTRACRANIAL  ANEURYSM FOR CLIPPING;  Surgeon: Lisbeth Renshaw, MD;  Location: [facility]URO ORS;  Service: Neurosurgery;  Laterality: Right;  . IR GENERIC HISTORICAL  02/16/2016   IR ANGIO VERTEBRAL SEL VERTEBRAL BILAT MOD SED 02/16/2016 Lisbeth Renshaw, MD [facility]  . IR GENERIC HISTORICAL  02/16/2016   IR ANGIO INTRA EXTRACRAN SEL INTERNAL CAROTID BILAT MOD SED 02/16/2016 Lisbeth Renshaw, MD [facility]  . RADIOLOGY WITH ANESTHESIA N/A 03/02/2015   Procedure: RADIOLOGY WITH ANESTHESIA;  Surgeon: Lisbeth Renshaw, MD;  Location: [facility] OR;  Service: Radiology;  Laterality: N/A;  . WISDOM TOOTH EXTRACTION         Family History  Adopted: Yes    Social History   Tobacco Use  . Smoking status: Current Every Day Smoker    Packs/day: 1.00    Years: 25.00    Pack years: 25.00    Types: Cigarettes  . Smokeless tobacco: Never Used  Vaping Use  . Vaping Use: Never used  Substance Use Topics  . Alcohol use: Yes  Alcohol/week: 35.0 standard drinks    Types: 35 Cans of beer per week  . Drug use: No    Home Medications Prior to Admission medications   Medication Sig Start Date End Date Taking? Authorizing Provider  allopurinol (ZYLOPRIM) 100 MG tablet Take 1 tablet (100 mg total) by mouth daily. 12/11/20  Yes Olive Bass, FNP  ALPRAZolam Prudy Feeler) 0.5 MG tablet TAKE 1 TABLET BY MOUTH THREE TIMES A DAY AS NEEDED FOR ANXIETY DUE 11-14-2020 Patient taking differently: Take 0.5 mg by mouth 3 (three) times daily as needed for anxiety. 12/11/20  Yes Corwin Levins, MD  fenofibrate (TRICOR) 145 MG tablet Take 1 tablet (145  mg total) by mouth daily. 12/11/20  Yes Olive Bass, FNP  ibuprofen (ADVIL) 200 MG tablet Take 200-400 mg by mouth every 6 (six) hours as needed for mild pain (or headaches).   Yes [provider]  icosapent Ethyl (VASCEPA) 1 g capsule Take 2 capsules (2 g total) by mouth 2 (two) times daily. 11/11/19  Yes Hilty, Lisette Abu, MD  losartan (COZAAR) 100 MG tablet Take 1 tablet (100 mg total) by mouth daily. 12/11/20  Yes Olive Bass, FNP  rosuvastatin (CRESTOR) 40 MG tablet TAKE 1 TABLET BY MOUTH ONCE DAILY. PATIENT NEEDS OV FOR FURTHER REFILLS Patient taking differently: Take 40 mg by mouth daily. 12/11/20  Yes Hilty, Lisette Abu, MD    Allergies    Zetia [ezetimibe]  Review of Systems   Review of Systems  Unable to perform ROS: Acuity of condition    Physical Exam Updated Vital Signs BP (!) 143/85 (BP Location: Left Arm)   Pulse 90   Temp 98.1 F (36.7 C) (Oral)   Resp 19   Ht 6\' 2"  (1.88 m)   Wt 128.2 kg   SpO2 97%   BMI 36.29 kg/m   Physical Exam Vitals and nursing note reviewed. Exam conducted with a chaperone present.  Constitutional:      Appearance: He is ill-appearing.  HENT:     Head: Normocephalic and atraumatic.     Nose: No rhinorrhea.  Eyes:     General:        Right eye: No discharge.        Left eye: No discharge.     Conjunctiva/sclera: Conjunctivae normal.  Cardiovascular:     Rate and Rhythm: Tachycardia present. Rhythm irregular.  Pulmonary:     Effort: Pulmonary effort is normal.     Breath sounds: No stridor. No wheezing.  Abdominal:     General: Abdomen is flat. There is distension.     Palpations: Abdomen is soft.     Tenderness: There is no abdominal tenderness. There is no guarding or rebound.     Hernia: A hernia (umbilical) is present.  Musculoskeletal:        General: No deformity or signs of injury.     Cervical back: Neck supple. No tenderness.     Right lower leg: Edema present.     Left lower leg: Edema present.   Skin:    General: Skin is warm and dry.  Neurological:     Mental Status: He is alert.     GCS: GCS eye subscore is 3. GCS verbal subscore is 4. GCS motor subscore is 6.     Motor: No weakness, tremor or abnormal muscle tone.     Comments: Patient is able to follow commands and move all 4 extremities equally.  Claims sensation intact throughout.  ED Results / Procedures / Treatments   Labs (all labs ordered are listed, but only abnormal results are displayed) Labs Reviewed  LACTIC ACID, PLASMA - Abnormal; Notable for the following components:      Result Value   Lactic Acid, Venous 3.1 (*)    All other components within normal limits  COMPREHENSIVE METABOLIC PANEL - Abnormal; Notable for the following components:   Glucose, Bld 108 (*)    BUN 23 (*)    Creatinine, Ser 2.08 (*)    Calcium 8.3 (*)    Total Protein 5.9 (*)    Albumin 3.4 (*)    AST 49 (*)    Alkaline Phosphatase 26 (*)    GFR, Estimated 37 (*)    All other components within normal limits  CBC WITH DIFFERENTIAL/PLATELET - Abnormal; Notable for the following components:   RBC 3.86 (*)    MCV 111.1 (*)    MCH 36.3 (*)    All other components within normal limits  ETHANOL - Abnormal; Notable for the following components:   Alcohol, Ethyl (B) 237 (*)    All other components within normal limits  BRAIN NATRIURETIC PEPTIDE - Abnormal; Notable for the following components:   B Natriuretic Peptide 107.5 (*)    All other components within normal limits  CBC - Abnormal; Notable for the following components:   MCV 109.7 (*)    MCH 35.6 (*)    RDW 15.7 (*)    All other components within normal limits  COMPREHENSIVE METABOLIC PANEL - Abnormal; Notable for the following components:   Sodium 147 (*)    Chloride 115 (*)    CO2 19 (*)    Creatinine, Ser 1.75 (*)    Total Protein 6.3 (*)    Albumin 3.4 (*)    AST 57 (*)    Alkaline Phosphatase 27 (*)    GFR, Estimated 45 (*)    All other components within normal  limits  D-DIMER, QUANTITATIVE - Abnormal; Notable for the following components:   D-Dimer, Quant 0.61 (*)    All other components within normal limits  BRAIN NATRIURETIC PEPTIDE - Abnormal; Notable for the following components:   B Natriuretic Peptide 129.6 (*)    All other components within normal limits  RESP PANEL BY RT-PCR (FLU A&B, COVID) ARPGX2  CULTURE, BLOOD (SINGLE)  URINE CULTURE  LACTIC ACID, PLASMA  PROTIME-INR  APTT  URINALYSIS, ROUTINE W REFLEX MICROSCOPIC  AMMONIA  HIV ANTIBODY (ROUTINE TESTING W REFLEX)  HEPATITIS PANEL, ACUTE  CK  MAGNESIUM  PHOSPHORUS  TSH  PREALBUMIN  SODIUM, URINE, RANDOM  CREATININE, URINE, RANDOM  TYPE AND SCREEN    EKG None  Radiology CT ABDOMEN PELVIS WO CONTRAST  Result Date: 12/15/2020 CLINICAL DATA:  Status post fall. EXAM: CT CHEST, ABDOMEN AND PELVIS WITHOUT CONTRAST TECHNIQUE: Multidetector CT imaging of the chest, abdomen and pelvis was performed following the standard protocol without IV contrast. COMPARISON:  None. FINDINGS: CT CHEST FINDINGS Cardiovascular: Heart size normal. No pericardial effusion. Coronary artery atherosclerotic calcifications noted. Mediastinum/Nodes: No enlarged mediastinal, hilar, or axillary lymph nodes. Thyroid gland, trachea, and esophagus demonstrate no significant findings. Lungs/Pleura: No pleural effusion. No airspace consolidation, atelectasis, or pneumothorax. Multiple scattered tiny lung nodules are identified throughout both lungs. These have a nonspecific appearance including: -Nodule within the left upper lobe measures 5 mm, image 39/3. -central right middle lobe lung nodule measures 5 mm, image 96/3. -Right lower lobe lung nodule measures 5 mm, image 96/3. -within the central  left lower lobe there is a 5 mm lung nodule, image 99/3. Musculoskeletal: No chest wall mass or suspicious bone lesions identified. CT ABDOMEN PELVIS FINDINGS Hepatobiliary: No focal liver abnormality is seen. No gallstones,  gallbladder wall thickening, or biliary dilatation. Pancreas: Unremarkable. No pancreatic ductal dilatation or surrounding inflammatory changes. Spleen: Normal in size without focal abnormality. Adrenals/Urinary Tract: Adrenal glands are unremarkable. Kidneys are normal, without renal calculi, focal lesion, or hydronephrosis. Bladder is unremarkable. Stomach/Bowel: Stomach is within normal limits. Appendix appears normal. No evidence of bowel wall thickening, distention, or inflammatory changes. Vascular/Lymphatic: Aortic atherosclerosis. No enlarged abdominal or pelvic lymph nodes. Reproductive: Prostate is unremarkable. Other: No free fluid or fluid collections. Fat containing umbilical hernia noted. There is also a small fat containing left inguinal hernia. Musculoskeletal: Spondylosis identified within the lumbar spine. This is most advanced at L5-S1. No acute or suspicious osseous findings. IMPRESSION: 1. No acute findings within the chest, abdomen or pelvis. 2. Small nonspecific pulmonary nodules are identified bilaterally. No follow-up needed if patient is low-risk (and has no known or suspected primary neoplasm). Non-contrast chest CT can be considered in 12 months if patient is high-risk. This recommendation follows the consensus statement: Guidelines for Management of Incidental Pulmonary Nodules Detected on CT Images: From the Fleischner Society 2017; Radiology 2017; [id number]. 3. Aortic Atherosclerosis (ICD10-I70.0). Coronary artery calcifications. Electronically Signed   By: Signa Kell M.D.   On: 12/15/2020 14:23   CT Head Wo Contrast  Result Date: 12/15/2020 CLINICAL DATA:  Mental status change fall. EXAM: CT HEAD WITHOUT CONTRAST CT CERVICAL SPINE WITHOUT CONTRAST TECHNIQUE: Multidetector CT imaging of the head and cervical spine was performed following the standard protocol without intravenous contrast. Multiplanar CT image reconstructions of the cervical spine were also generated.  COMPARISON:  CT head 03/10/15 FINDINGS: CT HEAD FINDINGS Brain: No evidence of acute infarction, hemorrhage, hydrocephalus, extra-axial collection or mass lesion/mass effect. There is mild diffuse low-attenuation within the subcortical and periventricular white matter compatible with chronic microvascular disease. Right frontal lobe encephalomalacia identified compatible with remote infarct. Vascular: No hyperdense vessel or unexpected calcification. Status post right MCA territory aneurysm clipping Skull: Changes from remote right frontal craniotomy noted. Sinuses/Orbits: There is the coastal thickening involving the sphenoid sinus and anterior ethmoid air cells as well as the frontal sinuses. Other: None CT CERVICAL SPINE FINDINGS Alignment: Normal. Skull base and vertebrae: No acute fracture. No primary bone lesion or focal pathologic process. Soft tissues and spinal canal: No prevertebral fluid or swelling. No visible canal hematoma. Disc levels:  Within normal limits. Upper chest: Negative Other: None IMPRESSION: 1. No acute intracranial abnormalities. 2. Chronic small vessel ischemic change. 3. 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 4. No signs of cervical spine fracture or dislocation. 5. Paranasal sinus inflammation. Electronically Signed   By: Signa Kell M.D.   On: 12/15/2020 14:13   CT Chest Wo Contrast  Result Date: 12/15/2020 CLINICAL DATA:  Status post fall. EXAM: CT CHEST, ABDOMEN AND PELVIS WITHOUT CONTRAST TECHNIQUE: Multidetector CT imaging of the chest, abdomen and pelvis was performed following the standard protocol without IV contrast. COMPARISON:  None. FINDINGS: CT CHEST FINDINGS Cardiovascular: Heart size normal. No pericardial effusion. Coronary artery atherosclerotic calcifications noted. Mediastinum/Nodes: No enlarged mediastinal, hilar, or axillary lymph nodes. Thyroid gland, trachea, and esophagus demonstrate no significant findings.  Lungs/Pleura: No pleural effusion. No airspace consolidation, atelectasis, or pneumothorax. Multiple scattered tiny lung nodules are identified throughout both lungs. These have a nonspecific appearance  including: -Nodule within the left upper lobe measures 5 mm, image 39/3. -central right middle lobe lung nodule measures 5 mm, image 96/3. -Right lower lobe lung nodule measures 5 mm, image 96/3. -within the central left lower lobe there is a 5 mm lung nodule, image 99/3. Musculoskeletal: No chest wall mass or suspicious bone lesions identified. CT ABDOMEN PELVIS FINDINGS Hepatobiliary: No focal liver abnormality is seen. No gallstones, gallbladder wall thickening, or biliary dilatation. Pancreas: Unremarkable. No pancreatic ductal dilatation or surrounding inflammatory changes. Spleen: Normal in size without focal abnormality. Adrenals/Urinary Tract: Adrenal glands are unremarkable. Kidneys are normal, without renal calculi, focal lesion, or hydronephrosis. Bladder is unremarkable. Stomach/Bowel: Stomach is within normal limits. Appendix appears normal. No evidence of bowel wall thickening, distention, or inflammatory changes. Vascular/Lymphatic: Aortic atherosclerosis. No enlarged abdominal or pelvic lymph nodes. Reproductive: Prostate is unremarkable. Other: No free fluid or fluid collections. Fat containing umbilical hernia noted. There is also a small fat containing left inguinal hernia. Musculoskeletal: Spondylosis identified within the lumbar spine. This is most advanced at L5-S1. No acute or suspicious osseous findings. IMPRESSION: 1. No acute findings within the chest, abdomen or pelvis. 2. Small nonspecific pulmonary nodules are identified bilaterally. No follow-up needed if patient is low-risk (and has no known or suspected primary neoplasm). Non-contrast chest CT can be considered in 12 months if patient is high-risk. This recommendation follows the consensus statement: Guidelines for Management of  Incidental Pulmonary Nodules Detected on CT Images: From the Fleischner Society 2017; Radiology 2017; [id number]. 3. Aortic Atherosclerosis (ICD10-I70.0). Coronary artery calcifications. Electronically Signed   By: Signa Kell M.D.   On: 12/15/2020 14:23   CT Cervical Spine Wo Contrast  Result Date: 12/15/2020 CLINICAL DATA:  Mental status change fall. EXAM: CT HEAD WITHOUT CONTRAST CT CERVICAL SPINE WITHOUT CONTRAST TECHNIQUE: Multidetector CT imaging of the head and cervical spine was performed following the standard protocol without intravenous contrast. Multiplanar CT image reconstructions of the cervical spine were also generated. COMPARISON:  CT head 03/10/15 FINDINGS: CT HEAD FINDINGS Brain: No evidence of acute infarction, hemorrhage, hydrocephalus, extra-axial collection or mass lesion/mass effect. There is mild diffuse low-attenuation within the subcortical and periventricular white matter compatible with chronic microvascular disease. Right frontal lobe encephalomalacia identified compatible with remote infarct. Vascular: No hyperdense vessel or unexpected calcification. Status post right MCA territory aneurysm clipping Skull: Changes from remote right frontal craniotomy noted. Sinuses/Orbits: There is the coastal thickening involving the sphenoid sinus and anterior ethmoid air cells as well as the frontal sinuses. Other: None CT CERVICAL SPINE FINDINGS Alignment: Normal. Skull base and vertebrae: No acute fracture. No primary bone lesion or focal pathologic process. Soft tissues and spinal canal: No prevertebral fluid or swelling. No visible canal hematoma. Disc levels:  Within normal limits. Upper chest: Negative Other: None IMPRESSION: 1. No acute intracranial abnormalities. 2. Chronic small vessel ischemic change. 3. 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 4. No signs of cervical spine fracture or dislocation. 5. Paranasal sinus inflammation. Electronically  Signed   By: Signa Kell M.D.   On: 12/15/2020 14:13   DG Pelvis Portable  Result Date: 12/15/2020 CLINICAL DATA:  Level 1 trauma, confusion, fall EXAM: PORTABLE PELVIS 1-2 VIEWS COMPARISON:  Portable exam 1328 hours without priors for comparison FINDINGS: Osseous mineralization normal for technique. Hip and SI joint spaces symmetric. No fracture, dislocation, or bone destruction. IMPRESSION: No acute abnormalities. Electronically Signed   By: Ulyses Southward M.D.   On: 12/15/2020 13:56  DG Chest Port 1 View  Result Date: 12/15/2020 CLINICAL DATA:  Acute respiratory distress. EXAM: PORTABLE CHEST 1 VIEW COMPARISON:  Earlier film, same date. FINDINGS: Stable cardiac enlargement. No acute pulmonary findings. No pleural effusions pulmonary lesions. The bony thorax is intact. IMPRESSION: Cardiac enlargement but no acute pulmonary findings. Electronically Signed   By: Rudie MeyerP.  Gallerani M.D.   On: 12/15/2020 21:49   DG Chest Port 1 View  Result Date: 12/15/2020 CLINICAL DATA:  Level 1 trauma, confusion, fall, RIGHT-side chest pain EXAM: PORTABLE CHEST 1 VIEW COMPARISON:  Portable exam 1326 hours compared to 03/03/2015 FINDINGS: Minimal enlargement of cardiac silhouette. Mediastinal contours and pulmonary vascularity normal. Lungs clear. No pulmonary infiltrate, pleural effusion or pneumothorax. No acute osseous findings. IMPRESSION: Minimal enlargement of cardiac silhouette. No acute abnormalities. Electronically Signed   By: Ulyses SouthwardMark  Boles M.D.   On: 12/15/2020 13:55    Procedures .Critical Care E&M Performed by: Sabino DonovanKatz, Leliana Kontz C, MD  Critical care provider statement:    Critical care time (minutes):  60   Critical care was necessary to treat or prevent imminent or life-threatening deterioration of the following conditions:  Circulatory failure and toxidrome   Critical care was time spent personally by me on the following activities:  Blood draw for specimens, development of treatment plan with patient or  surrogate, discussions with consultants, evaluation of patient's response to treatment, examination of patient, obtaining history from patient or surrogate, ordering and performing treatments and interventions, ordering and review of laboratory studies, re-evaluation of patient's condition and review of old charts   Care discussed with: admitting provider   After initial E/M assessment, critical care services were subsequently performed that were exclusive of separately billable procedures or treatment.       Medications Ordered in ED Medications  amiodarone (NEXTERONE PREMIX) 360-4.14 MG/200ML-% (1.8 mg/mL) IV infusion (30 mg/hr Intravenous New Bag/Given 12/15/20 2207)  enoxaparin (LOVENOX) injection 60 mg (60 mg Subcutaneous Given 12/15/20 1834)  albuterol (PROVENTIL) (2.5 MG/3ML) 0.083% nebulizer solution 2.5 mg (has no administration in time range)  LORazepam (ATIVAN) tablet 1-4 mg (has no administration in time range)    Or  LORazepam (ATIVAN) injection 1-4 mg (has no administration in time range)  thiamine tablet 100 mg (100 mg Oral Not Given 12/15/20 1807)    Or  thiamine (B-1) injection 100 mg ( Intravenous See Alternative 12/15/20 1807)  folic acid (FOLVITE) tablet 1 mg (1 mg Oral Not Given 12/15/20 1805)  multivitamin with minerals tablet 1 tablet (1 tablet Oral Not Given 12/15/20 1806)  LORazepam (ATIVAN) injection 0-4 mg (0 mg Intravenous Not Given 12/16/20 0323)    Followed by  LORazepam (ATIVAN) injection 0-4 mg (has no administration in time range)  icosapent Ethyl (VASCEPA) 1 g capsule 2 g (2 g Oral Not Given 12/15/20 2116)  fenofibrate tablet 160 mg (has no administration in time range)  rosuvastatin (CRESTOR) tablet 40 mg (has no administration in time range)  levalbuterol (XOPENEX) nebulizer solution 1.25 mg (1.25 mg Nebulization Given 12/15/20 2257)  vancomycin (VANCOREADY) IVPB 2000 mg/400 mL (2,000 mg Intravenous New Bag/Given 12/15/20 1421)  ceFEPIme (MAXIPIME) 2 g in sodium chloride  0.9 % 100 mL IVPB (0 g Intravenous Stopped 12/15/20 1447)  calcium chloride 1 g in sodium chloride 0.9 % 100 mL IVPB (1 g Intravenous New Bag/Given 12/15/20 1858)  sodium chloride 0.9 % 1,000 mL with thiamine 100 mg, folic acid 1 mg, multivitamins adult 10 mL infusion ( Intravenous New Bag/Given 12/15/20 2114)  furosemide (LASIX)  injection 40 mg (40 mg Intravenous Given 12/15/20 2157)  diltiazem (CARDIZEM) injection 10 mg (10 mg Intravenous Given 12/15/20 2156)    ED Course  I have reviewed the triage vital signs and the nursing notes.  Pertinent labs & imaging results that were available during my care of the patient were reviewed by me and considered in my medical decision making (see chart for details).    MDM Rules/Calculators/A&P                          Brought in via EMS for altered mental status after fall.  Was hypotensive in the field give some fluids and mild relief.  Also tachycardic in atrial fibrillation.  Given the patient is hypotensive altered and had a fall at home.  Cervical collar immediately placed on level 1 trauma is called.  Patient's airway is intact GCS is opens eyes to command.  Follows commands, is able to speak with slight slurring.  He is someone who drinks chronically and had multiple alcoholic drinks today.  Still hypotensive with atrial fibrillation with rapid ventricular response I consulted cardiology who recommends amnio infusion without bolus and no cardioversion.  Bedside ultrasound done by myself shows decent cardiac contractility with a collapsible IVC.  Patient looks volume overloaded on extremities however his intravascular volume seems dry.  Will give IV fluids.  Trauma notes that the with this being trauma we should give a unit of uncrossed red blood cells.  Patient's blood pressure starts to respond he is taken immediately to CT after chest x-ray and pelvis x-rays done without significant findings.  No signs of traumatic injuries on the head scalp or extremities.   Abdomen is distended and large.  If trauma evaluation is negative which I suspect will be his patient will be admitted to medical unit for further evaluation of metabolic encephalopathy.  Possibly related to alcoholism.  Screening labs for infection antibiotics are given.  Patient responded well to 1 unit of packed red blood cells during trauma evaluation as well as 1 L of lactated Ringer's.  Laboratory studies show significantly elevated ethanol level.  Ammonia normal.  Lactic acidosis.  Patient also has elevation in creatinine from baseline.  Low protein likely contributing to patient's pitting edema likely secondary to chronic alcohol use.  INR within normal limits.  No signs of anemia.  This patient will require further monitoring for altered mental status and acute kidney injury with diffuse lower extremity swelling.  The patient will be admitted to the hospitalist.  For the remainder this patient's care please see inpatient team notes.  I will intervene as needed while the patient remains in the emergency department.  CRITICAL CARE Performed by: Sabino Donovan   Total critical care time: 60 minutes  Critical care time was exclusive of separately billable procedures and treating other patients.  Critical care was necessary to treat or prevent imminent or life-threatening deterioration.  Critical care was time spent personally by me on the following activities: development of treatment plan with patient and/or surrogate as well as nursing, discussions with consultants, evaluation of patient's response to treatment, examination of patient, obtaining history from patient or surrogate, ordering and performing treatments and interventions, ordering and review of laboratory studies, ordering and review of radiographic studies, pulse oximetry and re-evaluation of patient's condition.    Final Clinical Impression(s) / ED Diagnoses Final diagnoses:  Alcohol abuse  Fall, initial encounter   Hypotension, unspecified hypotension type  Altered  mental status, unspecified altered mental status type  AKI (acute kidney injury) (HCC)  Acute respiratory distress    Rx / DC Orders ED Discharge Orders         Ordered    Amb referral to [facility]        12/15/20 1557           Sabino Donovan, MD 12/16/20 [phone]

## 2020-12-15 NOTE — ED Notes (Signed)
Vital signs stable. 

## 2020-12-15 NOTE — ED Notes (Addendum)
ED Provider at bedside, updating the wife at this time

## 2020-12-15 NOTE — Progress Notes (Addendum)
Called by RN that Rapid response called due to hypoxia with O2 sat in low 80s.  Went to bedside.  CXR shows cardiomegaly. Has increased interstitial markings upon inspection.  Continues in A-fib with RVR with HR 125-135. BP is 123/82. Is on amiodarone infusion that was started this afternoon. Pt has coarse wet breath sounds with diffuse wheezing.   Is on NRB and satting in mid 90s now.   Given Lasix IV.  Give Cardizem 10 mg IV now.  Check BNP.  Given xopenox neb inhaler.   Addendum: Checked on patient condition. Discussed pt with RN.  HR improved after Cardizem. Still in a-fib with rate of 105-115. BP stable.  Has had 500 ml output since lasix.  Less labored breathing.  Continue to monitor closely

## 2020-12-15 NOTE — ED Notes (Signed)
Patient denies pain and is resting comfortably.  

## 2020-12-15 NOTE — Consult Note (Signed)
[facility] Surgery Consult Note  Daniel Butler 1966-06-30  [id number].    Requesting MD: Daniel Ser, MD Chief Complaint/Reason for Consult: found down, AMS, hypotension  HPI:  Mr. 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 Conchita Butler), and EtOH abuse who presented to the ED via EMS after a fall that was witnessed by his wife. Patient had been drinking alcohol today and had a fall, c/o R hip pain and inability to get up. EMS was called. Patient initially a non-trauma but upgraded to a level 1 trauma in ED due to altered mental status and hypotension. He was also in a.fib with RVR. Pt states he has had 3-4 shots of liquor and 5-6 beers today, along with his xanax.  Per wife, pt has c/o left sided numbness, generalized weakness, and worsening mental status over 2-3 days.   ROS: Review of Systems  Constitutional: Negative.   HENT: Negative.   Eyes: Negative.   Respiratory: Negative.   Cardiovascular: Positive for leg swelling.  Gastrointestinal: Negative.   Genitourinary: Negative.   Musculoskeletal: Positive for falls, joint pain and myalgias.  Skin: Negative.   Neurological: Negative.        Hx brain aneurysm   Endo/Heme/Allergies: Negative.   Psychiatric/Behavioral: Positive for substance abuse.    Family History  Adopted: Yes    Past Medical History:  Diagnosis Date  . Brain aneurysm   . Chicken pox   . Depression   . Gout   . High cholesterol   . Hypertension     Past Surgical History:  Procedure Laterality Date  . CRANIOTOMY Right 03/02/2015   Procedure: CRANIOTOMY INTRACRANIAL  ANEURYSM FOR CLIPPING;  Surgeon: Daniel Renshaw, MD;  Location: [facility];  Service: Neurosurgery;  Laterality: Right;  . IR GENERIC HISTORICAL  02/16/2016   IR ANGIO VERTEBRAL SEL VERTEBRAL BILAT MOD SED 02/16/2016 Daniel Renshaw, MD [facility]  . IR GENERIC HISTORICAL  02/16/2016   IR  ANGIO INTRA EXTRACRAN SEL INTERNAL CAROTID BILAT MOD SED 02/16/2016 Daniel Renshaw, MD [facility]  . RADIOLOGY WITH ANESTHESIA N/A 03/02/2015   Procedure: RADIOLOGY WITH ANESTHESIA;  Surgeon: Daniel Renshaw, MD;  Location: [facility] OR;  Service: Radiology;  Laterality: N/A;  . WISDOM TOOTH EXTRACTION      Social History:  reports that he has been smoking cigarettes. He has a 25.00 pack-year smoking history. He has never used smokeless tobacco. He reports current alcohol use of about 35.0 standard drinks of alcohol per week. He reports that he does not use drugs.  Allergies:  Allergies  Allergen Reactions  . Zetia [Ezetimibe] Nausea And Vomiting    (Not in a hospital admission)   Blood pressure (!) 91/59, pulse (!) 131, temperature 98.7 F (37.1 C), temperature source Oral, resp. rate 18, height 6\' 2"  (1.88 m), weight 120.2 kg, SpO2 97 %. Physical Exam: Constitutional: obese white male, NAD, appears disheveled  Eyes: Moist conjunctiva; no lid lag; anicteric; PERRL Neck: Trachea midline;collar present Lungs: Normal respiratory effort; no tactile fremitus CV: irregulary irregular 130's; no m/r/g no palpable thrills; pitting edema present; 2+ radial pulses GI: Abd soft, obese, nontender, reducible umbilical hernia; no palpable hepatosplenomegaly MSK: symmetrical extremities, no clubbing/cyanosis Psychiatric: alert and oriented to person, time, situation, not to place Neuro: follows commands, speech slow but clear and appropraite, GCS 14 Lymphatic: No palpable cervical or axillary lymphadenopathy  No results found for this or any previous visit (  from the past 48 hour(s)). No results found.   Assessment/Plan 55 y/o M with above PMH presented after found down by his wife, c/o hip pain and inability to get up. Intoxicated, a.fib with RVR, hypotension. Hypotension improved with IVF and 1 u pRBCs. Trauma workup negative for any traumatic injury. Management of a.fib and metabolic  derangements per EDP and cardiology.  Daniel Phenix, PA-C [facility] Please see Amion for pager number during day hours 7:00am-4:30pm 12/15/2020, 1:19 PM

## 2020-12-15 NOTE — Progress Notes (Signed)
Orthopedic Tech Progress Note Patient Details:  Daniel Butler [birth year] [id number] Level 1 trauma Patient ID: Daniel Butler, male   DOB: [birth year], 55 y.o.   MRN: [id number]   Michelle Piper 12/15/2020, 2:31 PM

## 2020-12-15 NOTE — H&P (Addendum)
History and Physical    TAKERU BOSE PYP:[id number] DOB: 12/22/1965 DOA: 12/15/2020  Referring MD/NP/PA:Eric Myrtis Ser, MD PCP: Olive Bass, FNP  Patient coming from: Home via EMS  Chief Complaint: Fall I have personally briefly reviewed patient's old medical records in [facility]   HPI: Daniel Butler is a 55 y.o. 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  His wife is present at bedside and helps provide additional history along with the patient.  He had reportedly been drinking alcohol today and possibly had a beer and 2 shots prior to the fall.  His wife had left to go to the store and was only gone for less than an hour, but when she returned found him laying on the floor up from the couch.  Normally patient drinking 4-5 beers and a couple of shots per day on average.  His wife notes that here lately over the last couple weeks he has been more confused and intermittently and did not know who she was.  To her knowledge he never had a history of atrial fibrillation before in the past.  He has been having some issues with rectal leakage, but denied any reports of blood.  Scheduled to have an appointment with GI for colonoscopy in June.  Patient currently denies any other complaints and requests to go home.  ED Course: Upon admission into the emergency department patient was seen initially as a level 1 trauma due to hypotension and altered mental status.  Noted to be afebrile, pulse elevated up to 131 in atrial fibrillation, respirations 16-18, blood pressure 82/51 -117/84, and O2 saturations currently maintained on room air.  CT scan of the head, chest, abdomen, and pelvis showed no acute findings.  Incidental findings included pulmonary nodules and aortic atherosclerosis with coronary artery  calcifications.  Labs significant for BUN 23, creatinine 2.08, calcium 8.3, AST 49, ALT 23, ammonia 33, lactic acid 1.9, INR 1.2, alcohol level 237.  Urinalysis showed no acute abnormalities.  Sepsis protocol had initially been initiated with vancomycin, and cefepime.  Patient was also given 1 unit of packed red blood cells due to initial hypotension and have been formally evaluated by trauma surgery.  TRH called to admit.  Review of Systems  Unable to perform ROS: Mental status change  Constitutional: Negative for fever.  HENT: Negative for congestion and nosebleeds.   Eyes: Negative for photophobia and pain.  Respiratory: Negative for shortness of breath.   Cardiovascular: Positive for leg swelling. Negative for chest pain.  Gastrointestinal: Negative for abdominal pain, blood in stool, nausea and vomiting.  Genitourinary: Negative for dysuria and hematuria.  Musculoskeletal: Positive for falls and joint pain.  Neurological: Negative for loss of consciousness.  Psychiatric/Behavioral: Positive for substance abuse.  All other systems reviewed and are negative.   Past Medical History:  Diagnosis Date  . Brain aneurysm   . Chicken pox   . Depression   . Gout   . High cholesterol   . Hypertension     Past Surgical History:  Procedure Laterality Date  . CRANIOTOMY Right 03/02/2015   Procedure: CRANIOTOMY INTRACRANIAL  ANEURYSM FOR CLIPPING;  Surgeon: Lisbeth Renshaw, MD;  Location: [facility];  Service: Neurosurgery;  Laterality: Right;  . IR GENERIC HISTORICAL  02/16/2016   IR ANGIO VERTEBRAL SEL VERTEBRAL BILAT MOD SED 02/16/2016 Lisbeth Renshaw, MD [facility]  .  IR GENERIC HISTORICAL  02/16/2016   IR ANGIO INTRA EXTRACRAN SEL INTERNAL CAROTID BILAT MOD SED 02/16/2016 Lisbeth Renshaw, MD [facility]  . RADIOLOGY WITH ANESTHESIA N/A 03/02/2015   Procedure: RADIOLOGY WITH ANESTHESIA;  Surgeon: Lisbeth Renshaw, MD;  Location: [facility];  Service: Radiology;  Laterality: N/A;  .  WISDOM TOOTH EXTRACTION       reports that he has been smoking cigarettes. He has a 25.00 pack-year smoking history. He has never used smokeless tobacco. He reports current alcohol use of about 35.0 standard drinks of alcohol per week. He reports that he does not use drugs.  Allergies  Allergen Reactions  . Zetia [Ezetimibe] Nausea And Vomiting    Family History  Adopted: Yes    Prior to Admission medications   Medication Sig Start Date End Date Taking? Authorizing Provider  allopurinol (ZYLOPRIM) 100 MG tablet Take 1 tablet (100 mg total) by mouth daily. 12/11/20  Yes Olive Bass, FNP  ALPRAZolam Prudy Feeler) 0.5 MG tablet TAKE 1 TABLET BY MOUTH THREE TIMES A DAY AS NEEDED FOR ANXIETY DUE 11-14-2020 Patient taking differently: Take 0.5 mg by mouth 3 (three) times daily as needed for anxiety. 12/11/20  Yes Corwin Levins, MD  fenofibrate (TRICOR) 145 MG tablet Take 1 tablet (145 mg total) by mouth daily. 12/11/20  Yes Olive Bass, FNP  icosapent Ethyl (VASCEPA) 1 g capsule Take 2 capsules (2 g total) by mouth 2 (two) times daily. 11/11/19  Yes Hilty, Lisette Abu, MD  losartan (COZAAR) 100 MG tablet Take 1 tablet (100 mg total) by mouth daily. 12/11/20  Yes Olive Bass, FNP  rosuvastatin (CRESTOR) 40 MG tablet TAKE 1 TABLET BY MOUTH ONCE DAILY. PATIENT NEEDS OV FOR FURTHER REFILLS Patient taking differently: Take 40 mg by mouth daily. 12/11/20  Yes HiltyLisette Abu, MD    Physical Exam:  Constitutional: Obese male currently in no acute distress and requesting to go home Vitals:   12/15/20 1400 12/15/20 1415 12/15/20 1430 12/15/20 1445  BP: (!) 111/91 117/84 104/71 104/71  Pulse: 74 73 71 70  Resp: 13 12 16 11   Temp:      TempSrc:      SpO2: 92% 94% 98% 93%  Weight:      Height:       Eyes: PERRL, lids and conjunctivae normal ENMT: Mucous membranes are dry. Posterior pharynx clear of any exudate or lesions.    Poor dentition.  Neck: normal, supple, no masses, no  thyromegaly Respiratory: clear to auscultation bilaterally, no wheezing, no crackles. Normal respiratory effort. No accessory muscle use.  Cardiovascular: Irregular irregular, no murmurs / rubs / gallops.  2-3+ pitting bilateral extremity edema. 2+ pedal pulses. No carotid bruits.  Abdomen: Umbilical hernia appreciated., no masses palpated. No hepatosplenomegaly. Bowel sounds positive.  Musculoskeletal: no clubbing / cyanosis. No joint deformity upper and lower extremities. Good ROM, no contractures. Normal muscle tone.  Skin: Bruising noted of the right forehead. Neurologic: CN 2-12 grossly intact. Sensation intact, DTR normal. Strength 5/5 in all 4.  Psychiatric: Poor judgment and insight.  Lethargic but oriented to person and place.    Labs on Admission: I have personally reviewed following labs and imaging studies  CBC: Recent Labs  Lab 12/15/20 1334  WBC 6.6  NEUTROABS 3.8  HGB 14.0  HCT 42.9  MCV 111.1*  PLT 232   Basic Metabolic Panel: Recent Labs  Lab 12/15/20 1334  NA 142  K 3.7  CL 110  CO2 24  GLUCOSE 108*  BUN 23*  CREATININE 2.08*  CALCIUM 8.3*   GFR: Estimated Creatinine Clearance: 55.3 mL/min (A) (by C-G formula based on SCr of 2.08 mg/dL (H)). Liver Function Tests: Recent Labs  Lab 12/15/20 1334  AST 49*  ALT 23  ALKPHOS 26*  BILITOT 0.8  PROT 5.9*  ALBUMIN 3.4*   No results for input(s): LIPASE, AMYLASE in the last 168 hours. Recent Labs  Lab 12/15/20 1334  AMMONIA 33   Coagulation Profile: Recent Labs  Lab 12/15/20 1334  INR 1.2   Cardiac Enzymes: No results for input(s): CKTOTAL, CKMB, CKMBINDEX, TROPONINI in the last 168 hours. BNP (last 3 results) No results for input(s): PROBNP in the last 8760 hours. HbA1C: No results for input(s): HGBA1C in the last 72 hours. CBG: No results for input(s): GLUCAP in the last 168 hours. Lipid Profile: No results for input(s): CHOL, HDL, LDLCALC, TRIG, CHOLHDL, LDLDIRECT in the last 72  hours. Thyroid Function Tests: No results for input(s): TSH, T4TOTAL, FREET4, T3FREE, THYROIDAB in the last 72 hours. Anemia Panel: No results for input(s): VITAMINB12, FOLATE, FERRITIN, TIBC, IRON, RETICCTPCT in the last 72 hours. Urine analysis:    Component Value Date/Time   COLORURINE YELLOW 12/15/2020 1422   APPEARANCEUR CLEAR 12/15/2020 1422   LABSPEC 1.006 12/15/2020 1422   PHURINE 6.0 12/15/2020 1422   GLUCOSEU NEGATIVE 12/15/2020 1422   HGBUR NEGATIVE 12/15/2020 1422   BILIRUBINUR NEGATIVE 12/15/2020 1422   KETONESUR NEGATIVE 12/15/2020 1422   PROTEINUR NEGATIVE 12/15/2020 1422   UROBILINOGEN 0.2 03/02/2015 1337   NITRITE NEGATIVE 12/15/2020 1422   LEUKOCYTESUR NEGATIVE 12/15/2020 1422   Sepsis Labs: No results found for this or any previous visit (from the past 240 hour(s)).   Radiological Exams on Admission: CT ABDOMEN PELVIS WO CONTRAST  Result Date: 12/15/2020 CLINICAL DATA:  Status post fall. EXAM: CT CHEST, ABDOMEN AND PELVIS WITHOUT CONTRAST TECHNIQUE: Multidetector CT imaging of the chest, abdomen and pelvis was performed following the standard protocol without IV contrast. COMPARISON:  None. FINDINGS: CT CHEST FINDINGS Cardiovascular: Heart size normal. No pericardial effusion. Coronary artery atherosclerotic calcifications noted. Mediastinum/Nodes: No enlarged mediastinal, hilar, or axillary lymph nodes. Thyroid gland, trachea, and esophagus demonstrate no significant findings. Lungs/Pleura: No pleural effusion. No airspace consolidation, atelectasis, or pneumothorax. Multiple scattered tiny lung nodules are identified throughout both lungs. These have a nonspecific appearance including: -Nodule within the left upper lobe measures 5 mm, image 39/3. -central right middle lobe lung nodule measures 5 mm, image 96/3. -Right lower lobe lung nodule measures 5 mm, image 96/3. -within the central left lower lobe there is a 5 mm lung nodule, image 99/3. Musculoskeletal: No chest  wall mass or suspicious bone lesions identified. CT ABDOMEN PELVIS FINDINGS Hepatobiliary: No focal liver abnormality is seen. No gallstones, gallbladder wall thickening, or biliary dilatation. Pancreas: Unremarkable. No pancreatic ductal dilatation or surrounding inflammatory changes. Spleen: Normal in size without focal abnormality. Adrenals/Urinary Tract: Adrenal glands are unremarkable. Kidneys are normal, without renal calculi, focal lesion, or hydronephrosis. Bladder is unremarkable. Stomach/Bowel: Stomach is within normal limits. Appendix appears normal. No evidence of bowel wall thickening, distention, or inflammatory changes. Vascular/Lymphatic: Aortic atherosclerosis. No enlarged abdominal or pelvic lymph nodes. Reproductive: Prostate is unremarkable. Other: No free fluid or fluid collections. Fat containing umbilical hernia noted. There is also a small fat containing left inguinal hernia. Musculoskeletal: Spondylosis identified within the lumbar spine. This is most advanced at L5-S1. No acute or suspicious osseous findings. IMPRESSION: 1. No acute findings within the  chest, abdomen or pelvis. 2. Small nonspecific pulmonary nodules are identified bilaterally. No follow-up needed if patient is low-risk (and has no known or suspected primary neoplasm). Non-contrast chest CT can be considered in 12 months if patient is high-risk. This recommendation follows the consensus statement: Guidelines for Management of Incidental Pulmonary Nodules Detected on CT Images: From the Fleischner Society 2017; Radiology 2017; 284:228-243. 3. Aortic Atherosclerosis (ICD10-I70.0). Coronary artery calcifications. Electronically Signed   By: Signa Kell M.D.   On: 12/15/2020 14:23   CT Head Wo Contrast  Result Date: 12/15/2020 CLINICAL DATA:  Mental status change fall. EXAM: CT HEAD WITHOUT CONTRAST CT CERVICAL SPINE WITHOUT CONTRAST TECHNIQUE: Multidetector CT imaging of the head and cervical spine was performed following  the standard protocol without intravenous contrast. Multiplanar CT image reconstructions of the cervical spine were also generated. COMPARISON:  CT head 03/10/15 FINDINGS: CT HEAD FINDINGS Brain: No evidence of acute infarction, hemorrhage, hydrocephalus, extra-axial collection or mass lesion/mass effect. There is mild diffuse low-attenuation within the subcortical and periventricular white matter compatible with chronic microvascular disease. Right frontal lobe encephalomalacia identified compatible with remote infarct. Vascular: No hyperdense vessel or unexpected calcification. Status post right MCA territory aneurysm clipping Skull: Changes from remote right frontal craniotomy noted. Sinuses/Orbits: There is the coastal thickening involving the sphenoid sinus and anterior ethmoid air cells as well as the frontal sinuses. Other: None CT CERVICAL SPINE FINDINGS Alignment: Normal. Skull base and vertebrae: No acute fracture. No primary bone lesion or focal pathologic process. Soft tissues and spinal canal: No prevertebral fluid or swelling. No visible canal hematoma. Disc levels:  Within normal limits. Upper chest: Negative Other: None IMPRESSION: 1. No acute intracranial abnormalities. 2. Chronic small vessel ischemic change. 3. 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 4. No signs of cervical spine fracture or dislocation. 5. Paranasal sinus inflammation. Electronically Signed   By: Signa Kell M.D.   On: 12/15/2020 14:13   CT Chest Wo Contrast  Result Date: 12/15/2020 CLINICAL DATA:  Status post fall. EXAM: CT CHEST, ABDOMEN AND PELVIS WITHOUT CONTRAST TECHNIQUE: Multidetector CT imaging of the chest, abdomen and pelvis was performed following the standard protocol without IV contrast. COMPARISON:  None. FINDINGS: CT CHEST FINDINGS Cardiovascular: Heart size normal. No pericardial effusion. Coronary artery atherosclerotic calcifications noted. Mediastinum/Nodes: No  enlarged mediastinal, hilar, or axillary lymph nodes. Thyroid gland, trachea, and esophagus demonstrate no significant findings. Lungs/Pleura: No pleural effusion. No airspace consolidation, atelectasis, or pneumothorax. Multiple scattered tiny lung nodules are identified throughout both lungs. These have a nonspecific appearance including: -Nodule within the left upper lobe measures 5 mm, image 39/3. -central right middle lobe lung nodule measures 5 mm, image 96/3. -Right lower lobe lung nodule measures 5 mm, image 96/3. -within the central left lower lobe there is a 5 mm lung nodule, image 99/3. Musculoskeletal: No chest wall mass or suspicious bone lesions identified. CT ABDOMEN PELVIS FINDINGS Hepatobiliary: No focal liver abnormality is seen. No gallstones, gallbladder wall thickening, or biliary dilatation. Pancreas: Unremarkable. No pancreatic ductal dilatation or surrounding inflammatory changes. Spleen: Normal in size without focal abnormality. Adrenals/Urinary Tract: Adrenal glands are unremarkable. Kidneys are normal, without renal calculi, focal lesion, or hydronephrosis. Bladder is unremarkable. Stomach/Bowel: Stomach is within normal limits. Appendix appears normal. No evidence of bowel wall thickening, distention, or inflammatory changes. Vascular/Lymphatic: Aortic atherosclerosis. No enlarged abdominal or pelvic lymph nodes. Reproductive: Prostate is unremarkable. Other: No free fluid or fluid collections. Fat containing umbilical hernia noted. There  is also a small fat containing left inguinal hernia. Musculoskeletal: Spondylosis identified within the lumbar spine. This is most advanced at L5-S1. No acute or suspicious osseous findings. IMPRESSION: 1. No acute findings within the chest, abdomen or pelvis. 2. Small nonspecific pulmonary nodules are identified bilaterally. No follow-up needed if patient is low-risk (and has no known or suspected primary neoplasm). Non-contrast chest CT can be  considered in 12 months if patient is high-risk. This recommendation follows the consensus statement: Guidelines for Management of Incidental Pulmonary Nodules Detected on CT Images: From the Fleischner Society 2017; Radiology 2017; [id number]. 3. Aortic Atherosclerosis (ICD10-I70.0). Coronary artery calcifications. Electronically Signed   By: Signa Kell M.D.   On: 12/15/2020 14:23   CT Cervical Spine Wo Contrast  Result Date: 12/15/2020 CLINICAL DATA:  Mental status change fall. EXAM: CT HEAD WITHOUT CONTRAST CT CERVICAL SPINE WITHOUT CONTRAST TECHNIQUE: Multidetector CT imaging of the head and cervical spine was performed following the standard protocol without intravenous contrast. Multiplanar CT image reconstructions of the cervical spine were also generated. COMPARISON:  CT head 03/10/15 FINDINGS: CT HEAD FINDINGS Brain: No evidence of acute infarction, hemorrhage, hydrocephalus, extra-axial collection or mass lesion/mass effect. There is mild diffuse low-attenuation within the subcortical and periventricular white matter compatible with chronic microvascular disease. Right frontal lobe encephalomalacia identified compatible with remote infarct. Vascular: No hyperdense vessel or unexpected calcification. Status post right MCA territory aneurysm clipping Skull: Changes from remote right frontal craniotomy noted. Sinuses/Orbits: There is the coastal thickening involving the sphenoid sinus and anterior ethmoid air cells as well as the frontal sinuses. Other: None CT CERVICAL SPINE FINDINGS Alignment: Normal. Skull base and vertebrae: No acute fracture. No primary bone lesion or focal pathologic process. Soft tissues and spinal canal: No prevertebral fluid or swelling. No visible canal hematoma. Disc levels:  Within normal limits. Upper chest: Negative Other: None IMPRESSION: 1. No acute intracranial abnormalities. 2. Chronic small vessel ischemic change. 3. Previous right frontal lobe infarct with right MCA  territory aneurysm clipping. 4. No signs of cervical spine fracture or dislocation. 5. Paranasal sinus inflammation. Electronically Signed   By: Signa Kell M.D.   On: 12/15/2020 14:13   DG Pelvis Portable  Result Date: 12/15/2020 CLINICAL DATA:  Level 1 trauma, confusion, fall EXAM: PORTABLE PELVIS 1-2 VIEWS COMPARISON:  Portable exam 1328 hours without priors for comparison FINDINGS: Osseous mineralization normal for technique. Hip and SI joint spaces symmetric. No fracture, dislocation, or bone destruction. IMPRESSION: No acute abnormalities. Electronically Signed   By: Ulyses Southward M.D.   On: 12/15/2020 13:56   DG Chest Port 1 View  Result Date: 12/15/2020 CLINICAL DATA:  Level 1 trauma, confusion, fall, RIGHT-side chest pain EXAM: PORTABLE CHEST 1 VIEW COMPARISON:  Portable exam 1326 hours compared to 03/03/2015 FINDINGS: Minimal enlargement of cardiac silhouette. Mediastinal contours and pulmonary vascularity normal. Lungs clear. No pulmonary infiltrate, pleural effusion or pneumothorax. No acute osseous findings. IMPRESSION: Minimal enlargement of cardiac silhouette. No acute abnormalities. Electronically Signed   By: Ulyses Southward M.D.   On: 12/15/2020 13:55    EKG: Independently reviewed.  Atrial fibrillation at 135 bpm  Assessment/Plan Fall secondary to alcohol abuse with intoxication: Patient presents after having witnessed fall at home by his wife while he had been drinking today.  Alcohol level elevated at 237.  Imaging studies did not note any acute traumatic injuries. -Admit to a progressive bed -CIWA protocols with scheduled Ativan and as needed -Give banana bag IV x1 L at 75 mL/h -  Thiamine, multivitamin, folate po to start in a.m. -PT to evaluate and treat in a.m. -Transitions of care consult for alcohol abuse  New onset atrial fibrillation: Acute.  Patient was found to be in A. fib with heart rates into the 130s.  No prior history of irregular heart rhythm noted in the past.   Patient was started on a amiodarone drip due to initial hypotension. CHA2DS2-VASc score =1.  Patient would not be a good candidate for anticoagulation given alcohol abuse and prior history of subarachnoid hemorrhage. -Continue amiodarone drip -Monitored to maintain goal of at least potassium 4 and magnesium 2 -Add-on TSH -Check echocardiogram  Acute metabolic encephalopathy: Patient noted to be acutely altered, but suspect at least in part to his alcohol intoxication.  CT scan of the brain showed no acute abnormalities.  Ammonia level was within normal limits at 33. -Neurochecks  Transient hypotension: Acute.  Patient had been given 1 unit of packed red blood cells due to significant hypotension on arrival.  However, work-up negative for any significant traumatic injury, no significant concern for bleeding appreciated, and did not appear to be septic.  Home blood pressure medications include losartan 100 mg daily. -Continue IV fluids as seen above -Hold losartan  Lower extremity swelling: Patient with at least 2+ pitting edema in the bilateral lower extremities.  Unclear cause at this time. -Add on D-dimer -Elevate lower extremities  Lactic acidosis: Acute. Lactic acid was initially 1.9, but repeat elevated 3.2.  Suspect this is secondary to rapid A. fib with hypotension.  He had initially been given antibiotics, but so far no significant signs of infection appreciated. -Follow-up cultures -Trend cardiac troponin -Did not continue IV antibiotics at this time -IV fluids as seen above  Acute kidney injury: On admission creatinine elevated 2.08 with BUN 23.  Patient baseline creatinine previously have been around 1.15 in 2020.   He is not on any diuretics.  Urinalysis showed no significant abnormalities.  Suspect acute kidney injury possibly related with hypoperfusion secondary to A. fib and hypertension. -Check CK -Check urine sodium and urine creatinine -IV fluids as seen above -Recheck  kidney function in a.m.  Transaminitis: Acute.  AST 49 and ALT 23.  Ratio consistent with alcohol abuse.  No significant signs of liver cirrhosis or ascites noted on CT imaging. -Check hepatitis panel  History of right MCA brain aneurysm with subarachnoid hemorrhage s/p craniotomy and clipping by Dr. Conchita Paris in 2016.  Hypoalbuminemia: Acute.  Albumin was just mildly low at 3.4. -Check prealbumin in a.m.  Dyslipidemia -Continue current regimen   Anxiety: Home regimen includes Xanax 0.5 mg 3 times daily as needed -Held oral Ativan as patient is being monitored on CIWA protocols with scheduled/as needed Ativan  Obesity: BMI 34.02 kg/m.  Patient is suspected to have some aspect of sleep apnea as wife reports that the patient does snore. -Patient would likely benefit from referral for outpatient sleep study  Pulmonary nodules hernia: Imaging studies significant for incidental findings of pulmonary nodules, umbilical hernia containing fat, and the left inguinal hernia containing fat.  DVT prophylaxis: Lovenox Code Status: Full Family Communication: Wife updated at bedside Disposition Plan: To be determined Consults called:, Admission status: Inpatient   Clydie Braun MD [facility]   If 7PM-7AM, please contact night-coverage   12/15/2020, 3:09 PM

## 2020-12-16 ENCOUNTER — Encounter (HOSPITAL_COMMUNITY): Payer: Self-pay | Admitting: Internal Medicine

## 2020-12-16 ENCOUNTER — Inpatient Hospital Stay (HOSPITAL_COMMUNITY): Payer: Self-pay

## 2020-12-16 DIAGNOSIS — F10129 Alcohol abuse with intoxication, unspecified: Secondary | ICD-10-CM

## 2020-12-16 DIAGNOSIS — I4891 Unspecified atrial fibrillation: Secondary | ICD-10-CM

## 2020-12-16 LAB — PREALBUMIN: Prealbumin: 25.8 mg/dL (ref 18–38)

## 2020-12-16 LAB — COMPREHENSIVE METABOLIC PANEL
ALT: 24 U/L (ref 0–44)
AST: 57 U/L — ABNORMAL HIGH (ref 15–41)
Albumin: 3.4 g/dL — ABNORMAL LOW (ref 3.5–5.0)
Alkaline Phosphatase: 27 U/L — ABNORMAL LOW (ref 38–126)
Anion gap: 13 (ref 5–15)
BUN: 18 mg/dL (ref 6–20)
CO2: 19 mmol/L — ABNORMAL LOW (ref 22–32)
Calcium: 8.9 mg/dL (ref 8.9–10.3)
Chloride: 115 mmol/L — ABNORMAL HIGH (ref 98–111)
Creatinine, Ser: 1.75 mg/dL — ABNORMAL HIGH (ref 0.61–1.24)
GFR, Estimated: 45 mL/min — ABNORMAL LOW (ref >60)
Glucose, Bld: 90 mg/dL (ref 70–99)
Potassium: 4.6 mmol/L (ref 3.5–5.1)
Sodium: 147 mmol/L — ABNORMAL HIGH (ref 135–145)
Total Bilirubin: 1.1 mg/dL (ref 0.3–1.2)
Total Protein: 6.3 g/dL — ABNORMAL LOW (ref 6.5–8.1)

## 2020-12-16 LAB — BPAM RBC
Blood Product Expiration Date: 202204272359
ISSUE DATE / TIME: 202204081329
Unit Type and Rh: 5100

## 2020-12-16 LAB — CBC
HCT: 47.5 % (ref 39.0–52.0)
Hemoglobin: 15.4 g/dL (ref 13.0–17.0)
MCH: 35.6 pg — ABNORMAL HIGH (ref 26.0–34.0)
MCHC: 32.4 g/dL (ref 30.0–36.0)
MCV: 109.7 fL — ABNORMAL HIGH (ref 80.0–100.0)
Platelets: 227 10*3/uL (ref 150–400)
RBC: 4.33 MIL/uL (ref 4.22–5.81)
RDW: 15.7 % — ABNORMAL HIGH (ref 11.5–15.5)
WBC: 7.2 10*3/uL (ref 4.0–10.5)
nRBC: 0 % (ref 0.0–0.2)

## 2020-12-16 LAB — URINE CULTURE: Culture: NO GROWTH

## 2020-12-16 LAB — TYPE AND SCREEN
ABO/RH(D): A NEG
Antibody Screen: NEGATIVE
Unit division: 0

## 2020-12-16 LAB — BLOOD PRODUCT ORDER (VERBAL) VERIFICATION

## 2020-12-16 MED ORDER — CARVEDILOL 3.125 MG PO TABS
3.1250 mg | ORAL_TABLET | Freq: Two times a day (BID) | ORAL | Status: DC
  Administered 2020-12-16: 3.125 mg via ORAL
  Filled 2020-12-16: qty 1

## 2020-12-16 MED ORDER — CHLORDIAZEPOXIDE HCL 5 MG PO CAPS
10.0000 mg | ORAL_CAPSULE | Freq: Three times a day (TID) | ORAL | Status: DC
  Administered 2020-12-16 – 2020-12-18 (×9): 10 mg via ORAL
  Filled 2020-12-16 (×9): qty 2

## 2020-12-16 NOTE — Evaluation (Signed)
Physical Therapy Evaluation Patient Details Name: Daniel Butler MRN: [id number] DOB: 09-01-1966 Today's Date: 12/16/2020   History of Present Illness  Daniel Butler is a 55 y.o. 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  His wife is present at bedside and helps provide additional history along with the patient.  He had reportedly been drinking alcohol today and possibly had a beer and 2 shots prior to the fall.  His wife had left to go to the store and was only gone for less than an hour, but when she returned found him laying on the floor up from the couch.  Clinical Impression  Patient received in bed, RN cleared patient to work with PT. Patient had rapid response earlier this am after chocking on liquids and required Bipap briefly. Patient on 2 lpm O2 at time of assessment. Patient is agreeable to PT, reports fatigue. He is mod independent with bed mobility, transfers with min assist from elevated bed. He is weak and requires increased time and effort to perform. HR during bed mobility and transfer up to 160s. Performed a few standing marches at bedside. Patient declined getting over to recliner this session due to weakness. He will continue to benefit from skilled PT while here to improve strength and functional independence.       Follow Up Recommendations Home health PT    Equipment Recommendations  None recommended by PT    Recommendations for Other Services       Precautions / Restrictions Precautions Precautions: Fall Restrictions Weight Bearing Restrictions: No      Mobility  Bed Mobility Overal bed mobility: Modified Independent             General bed mobility comments: increased time and effort, but no physical assist provided    Transfers Overall transfer level: Needs  assistance Equipment used: Rolling walker (2 wheeled) Transfers: Sit to/from Stand Sit to Stand: Min assist;From elevated surface         General transfer comment: patient attempted to stand at bedside but unable to get fully upright. With bed elevated and walker in front of him he was then able to stand with min assist. HR up to 160s with mobility.  Ambulation/Gait   Gait Distance (Feet): 0 Feet         General Gait Details: patient declined attempt to get into chair this session due to leg weakness.  Stairs            Wheelchair Mobility    Modified Rankin (Stroke Patients Only)       Balance Overall balance assessment: Needs assistance Sitting-balance support: Feet supported Sitting balance-Leahy Scale: Fair Sitting balance - Comments: reports dizziness with initial sitting up, got better as he sat longer.   Standing balance support: Bilateral upper extremity supported;During functional activity Standing balance-Leahy Scale: Poor Standing balance comment: reliant on B UE support, min assist, legs weak in standing.                             Pertinent Vitals/Pain Pain Assessment: No/denies pain    Home Living Family/patient expects to be discharged to:: Private residence Living Arrangements: Spouse/significant other Available Help at Discharge: Family;Available 24 hours/day Type of Home: House Home Access: Stairs to enter Entrance Stairs-Rails: Right Entrance Stairs-Number of Steps: 2 Home Layout: Two level;Able  to live on main level with bedroom/bathroom Home Equipment: Walker - 4 wheels;Shower seat;Cane - single point      Prior Function Level of Independence: Independent with assistive device(s)         Comments: wife states patient was having trouble getting around recently using rollator     Hand Dominance        Extremity/Trunk Assessment   Upper Extremity Assessment Upper Extremity Assessment: Generalized weakness     Lower Extremity Assessment Lower Extremity Assessment: Generalized weakness    Cervical / Trunk Assessment Cervical / Trunk Assessment: Normal  Communication   Communication: No difficulties  Cognition Arousal/Alertness: Awake/alert Behavior During Therapy: Flat affect Overall Cognitive Status: Within Functional Limits for tasks assessed                                        General Comments      Exercises     Assessment/Plan    PT Assessment Patient needs continued PT services  PT Problem List Decreased strength;Decreased mobility;Decreased activity tolerance;Decreased balance;Decreased knowledge of precautions       PT Treatment Interventions DME instruction;Therapeutic exercise;Gait training;Stair training;Neuromuscular re-education;Functional mobility training;Therapeutic activities;Patient/family education    PT Goals (Current goals can be found in the Care Plan section)  Acute Rehab PT Goals Patient Stated Goal: to improve strength PT Goal Formulation: With patient/family Time For Goal Achievement: 12/30/20 Potential to Achieve Goals: Good    Frequency Min 3X/week   Barriers to discharge Inaccessible home environment      Co-evaluation               AM-PAC PT "6 Clicks" Mobility  Outcome Measure Help needed turning from your back to your side while in a flat bed without using bedrails?: None Help needed moving from lying on your back to sitting on the side of a flat bed without using bedrails?: None Help needed moving to and from a bed to a chair (including a wheelchair)?: A Lot Help needed standing up from a chair using your arms (e.g., wheelchair or bedside chair)?: A Little Help needed to walk in hospital room?: A Lot Help needed climbing 3-5 steps with a railing? : Total 6 Click Score: 16    End of Session Equipment Utilized During Treatment: Gait belt Activity Tolerance: Patient limited by lethargy;Patient limited by  fatigue Patient left: in bed;with call bell/phone within reach;with bed alarm set;with family/visitor present Nurse Communication: Mobility status PT Visit Diagnosis: Unsteadiness on feet (R26.81);Other abnormalities of gait and mobility (R26.89);Difficulty in walking, not elsewhere classified (R26.2);Muscle weakness (generalized) (M62.81);History of falling (Z91.81)    Time: 1110-1141 PT Time Calculation (min) (ACUTE ONLY): 31 min   Charges:   PT Evaluation $PT Eval Moderate Complexity: 1 Mod PT Treatments $Therapeutic Activity: 8-22 mins        Norwin Aleman, PT, GCS 12/16/20,11:53 AM

## 2020-12-16 NOTE — Evaluation (Signed)
Clinical/Bedside Swallow Evaluation Patient Details  Name: Daniel Butler MRN: [id number] Date of Birth: [birth year]  Today's Date: 12/16/2020 Time: SLP Start Time (ACUTE ONLY): 1534 SLP Stop Time (ACUTE ONLY): 1600 SLP Time Calculation (min) (ACUTE ONLY): 26 min  Past Medical History:  Past Medical History:  Diagnosis Date  . Brain aneurysm   . Chicken pox   . Depression   . Gout   . High cholesterol   . Hypertension    Past Surgical History:  Past Surgical History:  Procedure Laterality Date  . CRANIOTOMY Right 03/02/2015   Procedure: CRANIOTOMY INTRACRANIAL  ANEURYSM FOR CLIPPING;  Surgeon: Lisbeth Renshaw, MD;  Location: [facility];  Service: Neurosurgery;  Laterality: Right;  . IR GENERIC HISTORICAL  02/16/2016   IR ANGIO VERTEBRAL SEL VERTEBRAL BILAT MOD SED 02/16/2016 Lisbeth Renshaw, MD [facility]  . IR GENERIC HISTORICAL  02/16/2016   IR ANGIO INTRA EXTRACRAN SEL INTERNAL CAROTID BILAT MOD SED 02/16/2016 Lisbeth Renshaw, MD [facility]  . RADIOLOGY WITH ANESTHESIA N/A 03/02/2015   Procedure: RADIOLOGY WITH ANESTHESIA;  Surgeon: Lisbeth Renshaw, MD;  Location: [facility] OR;  Service: Radiology;  Laterality: N/A;  . WISDOM TOOTH EXTRACTION     HPI:  Daniel Butler is a 55 y.o. 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 He had reportedly been drinking alcohol today and possibly had a beer and 2 shots prior to the fall. MD suspects fall due to ETOH and Xanex. CT No acute intracranial abnormalities, chronic small vessel ischemic change. Noted at 0136 am note from nurse that pt began choking on a sip on water and became hypoxic Spo2 60's on 12/15/20 at 2045. Encouraged coughing and placed pt on NRB.   Assessment / Plan / Recommendation Clinical Impression  SLP suspects pt may be experiencing LPR  (laryngopharyngeal reflux). HIs dentition is sparse and in poor condition. Front of tongue was bleeding slightly and noted ulcer sublingual as well as stringy white mucous throughout (RN will request meds). Per RN/pt/wife report he experienced explosive cough while drinking milk this morning. Pt stated excesive thurst and "i chugged it". Prior to admission wife thought he may have a cold due to expectoration of mucous but deny prior s/s aspiration. On arrival pt's voice had a wet significant wet quality that acutally cleared once po's consumed. Puree, and thin via straw consumed without s/s aspiration. Mastication of solid was functional. Educated re: concern of reflux or possible esophageal dysmotility. Reviewed strategies for positioning during and after meals. Recommend regular texture, thin liquids, pills whole in puree and no ST follow up needed. SLP Visit Diagnosis: Dysphagia, unspecified (R13.10)    Aspiration Risk  Mild aspiration risk    Diet Recommendation Regular;Thin liquid   Liquid Administration via: Cup;Straw Medication Administration: Whole meds with puree Supervision: Patient able to self feed;Intermittent supervision to cue for compensatory strategies Compensations: Slow rate;Small sips/bites Postural Changes: Seated upright at 90 degrees;Remain upright for at least 30 minutes after po intake    Other  Recommendations Oral Care Recommendations: Oral care BID   Follow up Recommendations None      Frequency and Duration            Prognosis        Swallow Study   General Date of Onset: 12/15/20 HPI: Daniel Butler is a 55 y.o. 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 He had reportedly been drinking alcohol today and possibly had a beer and 2 shots prior to the fall. MD suspects fall due to  ETOH and Xanex. CT No acute intracranial abnormalities, chronic small vessel ischemic change. Noted at 0136 am note from nurse that pt began choking on a sip on water and became hypoxic Spo2 60's on 12/15/20 at 2045. Encouraged coughing and placed pt on NRB. Type of Study: Bedside Swallow Evaluation Temperature Spikes Noted: No Respiratory Status: Nasal cannula History of Recent Intubation: No Behavior/Cognition: Alert;Cooperative;Pleasant mood Oral Cavity Assessment: Other (comment) (lingual ulcer, also question of candidias) Oral Care Completed by SLP: Yes Oral Cavity - Dentition: Poor condition;Missing dentition Vision: Functional for self-feeding Self-Feeding Abilities: Needs assist Patient Positioning: Upright in bed Baseline Vocal Quality: Wet Volitional Cough: Strong Volitional Swallow: Able to elicit    Oral/Motor/Sensory Function Overall Oral Motor/Sensory Function: Within functional limits   Ice Chips Ice chips: Not tested   Thin Liquid Thin Liquid: Impaired Presentation: Cup;Straw Pharyngeal  Phase Impairments: Cough - Delayed    Nectar Thick Nectar Thick Liquid: Not tested   Honey Thick Honey Thick Liquid: Not tested   Puree Puree: Within functional limits   Solid     Solid: Within functional limits      Daniel Butler 12/16/2020,5:22 PM  Breck Coons Lonell Face.Ed Nurse, children's [phone] Office [phone]

## 2020-12-16 NOTE — CV Procedure (Signed)
Echo attempted but HR elevated. Will try again later when HR normalizes.

## 2020-12-16 NOTE — Progress Notes (Signed)
PROGRESS NOTE        PATIENT DETAILS Name: Daniel Butler Age: 55 y.o. Sex: male Date of Birth: April 18, 1966 Admit Date: 12/15/2020 Admitting Physician Clydie Braun, MD [facility]:[id number], Allyne Gee, FNP  Brief Narrative: Patient is a 55 y.o. male significant past medical history of alcohol abuse, SAH-s/p craniotomy with clipping in 2016, HTN, HLD, depression/anxiety-who presented to the hospital in the setting of a mechanical fall due to alcohol intoxication.  In the ED-patient was evaluated by trauma services-and found to have no major injuries.  However he was found to have A. fib with RVR, significant lower extremity edema, AKI-started on amiodarone infusion and admitted to the hospitalist service.  See below for further details.  Significant events: 4/8>> fall-alcohol intoxication-brought to ED-found to be in RVR/AKI/lower extremity edema.  Admit to [facility]  Significant studies: 4/8>> CT head: No intracranial abnormalities 4/8>> CT C-spine: No signs of cervical fracture or dislocation 4/8>> CT chest: No acute findings in the chest-nonspecific pulmonary nodules bilaterally 4/8>> CT abdomen/pelvis: No acute injury/acute findings 4/8>> EtOH level: 237 4/8>> acute hepatitis serology: Negative 4/8>> TSH: 2.37  Antimicrobial therapy: None  Microbiology data: 4/8>> blood culture: Pending 4/8>> urine culture: Pending  Procedures : None  Consults: Trauma, cardiology  DVT Prophylaxis : SCDs Start: 12/15/20 1557  Prophylactic Lovenox   Subjective: Awake/alert-answering most of my questions appropriately.  Appears weak.   Assessment/Plan: Mechanical fall: Likely due to alcohol intoxication and use of Xanax-await  PT/OT eval.  Imaging negative for fractures/acute injuries.  Per spouse-has had multiple falls over the past 2 weeks.  A. fib with RVR: Remains in RVR-continue amiodarone infusion-await echocardiogram.  TSH within normal limits.  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  Not a good long-term candidate for anticoagulation given history of alcohol use/falls-and prior SAH.  Await cardiology opinion.  AKI: Likely hemodynamically mediated-patient had transient hypotension in the emergency room-furthermore he also appears to be on losartan.  UA negative for proteinuria-CT abdomen negative for hydronephrosis-creatinine improving with supportive care.  Lower extremity edema: Probably related to possible underlying CHF-echo pending.  UA negative for proteinuria-although has history of alcohol use-liver does not appear nodular on CT.  Furthermore-INR normal and albumin levels only borderline low.  Await echo-hold diuretics for now-allow renal function to improve further.  Acute toxic metabolic encephalopathy: Due to alcohol intoxication-AKI-mentation has improved.  Answering most of my questions appropriately.  ?  Dysphagia: Choking spell this a.m.-keep n.p.o.-await SLP eval.  Lactic acidosis: Due to hypoperfusion in the setting of hypotension-no indication of sepsis.  Mild transaminitis: Probably mild alcoholic hepatitis-acute hepatitis serology negative.  No signs of cirrhosis on imaging.  HLD: Remains on fenofibrate, Vascepa, Crestor-watch LFTs closely.  HTN: BP stable-losartan on hold due to AKI  Anxiety: On benzodiazepine per CIWA protocol.  Per spouse-takes Xanax multiple times a day-we will go ahead and place him on scheduled benzodiazepines-we will use Librium-and taper slowly over the next few days.  Alcohol abuse: Has somewhat of a flushed appearance-but no tremors-is awake/alert-on Ativan per CIWA protocol.  Remains at risk for progression to DTs.  Last drink was 4/8.  Drinks ATLEAST 6 pack of beer daily + 2-3 shots of liquor+daily zanax use  Bilateral pulmonary nodules: Per radiology-repeat CT chest in 12 months  Debility/deconditioning/multiple falls: Await PT/OT eval  Morbid Obesity: Estimated body  mass index  is 36.29 kg/m as calculated from the following:   Height as of this encounter: 6\' 2"  (1.88 m).   Weight as of this encounter: 128.2 kg.    Diet: Diet Order            Diet NPO time specified  Diet effective now                  Code Status: Full code   Family Communication: Spouse-[name]-[phone]-over the phone  Disposition Plan: Status is: Inpatient  Remains inpatient appropriate because:Inpatient level of care appropriate due to severity of illness   Dispo: The patient is from: Home              Anticipated d/c is to: Home              Patient currently is not medically stable to d/c.   Difficult to place patient No    Barriers to Discharge: Afib RVR-on amiodarone infusion  Antimicrobial agents: Anti-infectives (From admission, onward)   Start     Dose/Rate Route Frequency Ordered Stop   12/15/20 1400  vancomycin (VANCOREADY) IVPB 2000 mg/400 mL        2,000 mg 200 mL/hr over 120 Minutes Intravenous  Once 12/15/20 1358 12/15/20 1621   12/15/20 1400  ceFEPIme (MAXIPIME) 2 g in sodium chloride 0.9 % 100 mL IVPB        2 g 200 mL/hr over 30 Minutes Intravenous  Once 12/15/20 1358 12/15/20 1447       Time spent: 35 minutes-Greater than 50% of this time was spent in counseling, explanation of diagnosis, planning of further management, and coordination of care.  MEDICATIONS: Scheduled Meds: . enoxaparin (LOVENOX) injection  60 mg Subcutaneous Q24H  . fenofibrate  160 mg Oral Daily  . folic acid  1 mg Oral Daily  . icosapent Ethyl  2 g Oral BID  . LORazepam  0-4 mg Intravenous Q6H   Followed by  . [START ON 12/17/2020] LORazepam  0-4 mg Intravenous Q12H  . multivitamin with minerals  1 tablet Oral Daily  . rosuvastatin  40 mg Oral Daily  . thiamine  100 mg Oral Daily   Or  . thiamine  100 mg Intravenous Daily   Continuous Infusions: . amiodarone 30 mg/hr (12/15/20 2207)   PRN Meds:.albuterol, levalbuterol, LORazepam **OR**  LORazepam   PHYSICAL EXAM: Vital signs: Vitals:   12/15/20 2331 12/16/20 0322 12/16/20 0728 12/16/20 0908  BP: 102/63 (!) 143/85 131/87 (!) 138/98  Pulse: (!) 121 90 (!) 112 (!) 52  Resp: 17 19 (!) 26 19  Temp: 97.6 F (36.4 C) 98.1 F (36.7 C) 99.5 F (37.5 C) 97.7 F (36.5 C)  TempSrc: Oral Oral Axillary Oral  SpO2: 94% 97% 95% 94%  Weight:  128.2 kg    Height:       Filed Weights   12/15/20 1307 12/16/20 0322  Weight: 120.2 kg 128.2 kg   Body mass index is 36.29 kg/m.   Gen Exam:Alert awake-not in any distress HEENT:atraumatic, normocephalic Chest: B/L clear to auscultation anteriorly CVS:S1S2 irregular Abdomen:soft non tender, non distended Extremities:+++ edema Neurology: Non focal Skin: no rash  I have personally reviewed following labs and imaging studies  LABORATORY DATA: CBC: Recent Labs  Lab 12/15/20 1334 12/16/20 0136  WBC 6.6 7.2  NEUTROABS 3.8  --   HGB 14.0 15.4  HCT 42.9 47.5  MCV 111.1* 109.7*  PLT 232 227    Basic Metabolic Panel: Recent Labs  Lab 12/15/20  1334 12/15/20 1552 12/16/20 0136  NA 142  --  147*  K 3.7  --  4.6  CL 110  --  115*  CO2 24  --  19*  GLUCOSE 108*  --  90  BUN 23*  --  18  CREATININE 2.08*  --  1.75*  CALCIUM 8.3*  --  8.9  MG  --  2.2  --   PHOS  --  3.3  --     GFR: Estimated Creatinine Clearance: 67.9 mL/min (A) (by C-G formula based on SCr of 1.75 mg/dL (H)).  Liver Function Tests: Recent Labs  Lab 12/15/20 1334 12/16/20 0136  AST 49* 57*  ALT 23 24  ALKPHOS 26* 27*  BILITOT 0.8 1.1  PROT 5.9* 6.3*  ALBUMIN 3.4* 3.4*   No results for input(s): LIPASE, AMYLASE in the last 168 hours. Recent Labs  Lab 12/15/20 1334  AMMONIA 33    Coagulation Profile: Recent Labs  Lab 12/15/20 1334  INR 1.2    Cardiac Enzymes: Recent Labs  Lab 12/15/20 1552  CKTOTAL 377    BNP (last 3 results) No results for input(s): PROBNP in the last 8760 hours.  Lipid Profile: No results for  input(s): CHOL, HDL, LDLCALC, TRIG, CHOLHDL, LDLDIRECT in the last 72 hours.  Thyroid Function Tests: Recent Labs    12/15/20 1557  TSH 2.377    Anemia Panel: No results for input(s): VITAMINB12, FOLATE, FERRITIN, TIBC, IRON, RETICCTPCT in the last 72 hours.  Urine analysis:    Component Value Date/Time   COLORURINE YELLOW 12/15/2020 1422   APPEARANCEUR CLEAR 12/15/2020 1422   LABSPEC 1.006 12/15/2020 1422   PHURINE 6.0 12/15/2020 1422   GLUCOSEU NEGATIVE 12/15/2020 1422   HGBUR NEGATIVE 12/15/2020 1422   BILIRUBINUR NEGATIVE 12/15/2020 1422   KETONESUR NEGATIVE 12/15/2020 1422   PROTEINUR NEGATIVE 12/15/2020 1422   UROBILINOGEN 0.2 03/02/2015 1337   NITRITE NEGATIVE 12/15/2020 1422   LEUKOCYTESUR NEGATIVE 12/15/2020 1422    Sepsis Labs: Lactic Acid, Venous    Component Value Date/Time   LATICACIDVEN 3.1 (HH) 12/15/2020 1552    MICROBIOLOGY: Recent Results (from the past 240 hour(s))  Resp Panel by RT-PCR (Flu A&B, Covid) Nasopharyngeal Swab     Status: None   Collection Time: 12/15/20  2:17 PM   Specimen: Nasopharyngeal Swab; Nasopharyngeal(NP) swabs in vial transport medium  Result Value Ref Range Status   SARS Coronavirus 2 by RT PCR NEGATIVE NEGATIVE Final    Comment: (NOTE) SARS-CoV-2 target nucleic acids are NOT DETECTED.  The SARS-CoV-2 RNA is generally detectable in upper respiratory specimens during the acute phase of infection. The lowest concentration of SARS-CoV-2 viral copies this assay can detect is 138 copies/mL. A negative result does not preclude SARS-Cov-2 infection and should not be used as the sole basis for treatment or other patient management decisions. A negative result may occur with  improper specimen collection/handling, submission of specimen other than nasopharyngeal swab, presence of viral mutation(s) within the areas targeted by this assay, and inadequate number of viral copies(<138 copies/mL). A negative result must be combined  with clinical observations, patient history, and epidemiological information. The expected result is Negative.  Fact Sheet for Patients:  BloggerCourse.comhttps://www.fda.gov/media/152166/download  Fact Sheet for Healthcare Providers:  SeriousBroker.ithttps://www.fda.gov/media/152162/download  This test is no t yet approved or cleared by the [place] FDA and  has been authorized for detection and/or diagnosis of SARS-CoV-2 by FDA under an Emergency Use Authorization (EUA). This EUA will remain  in effect (meaning this  test can be used) for the duration of the COVID-19 declaration under Section 564(b)(1) of the Act, 21 U.S.C.section 360bbb-3(b)(1), unless the authorization is terminated  or revoked sooner.       Influenza A by PCR NEGATIVE NEGATIVE Final   Influenza B by PCR NEGATIVE NEGATIVE Final    Comment: (NOTE) The Xpert Xpress SARS-CoV-2/FLU/RSV plus assay is intended as an aid in the diagnosis of influenza from Nasopharyngeal swab specimens and should not be used as a sole basis for treatment. Nasal washings and aspirates are unacceptable for Xpert Xpress SARS-CoV-2/FLU/RSV testing.  Fact Sheet for Patients: BloggerCourse.com  Fact Sheet for Healthcare Providers: SeriousBroker.it  This test is not yet approved or cleared by the [place] FDA and has been authorized for detection and/or diagnosis of SARS-CoV-2 by FDA under an Emergency Use Authorization (EUA). This EUA will remain in effect (meaning this test can be used) for the duration of the COVID-19 declaration under Section 564(b)(1) of the Act, 21 U.S.C. section 360bbb-3(b)(1), unless the authorization is terminated or revoked.  Performed at [facility], [address] [place], [place] [address]     RADIOLOGY STUDIES/RESULTS: CT ABDOMEN PELVIS WO CONTRAST  Result Date: 12/15/2020 CLINICAL DATA:  Status post fall. EXAM: CT CHEST, ABDOMEN AND PELVIS WITHOUT CONTRAST  TECHNIQUE: Multidetector CT imaging of the chest, abdomen and pelvis was performed following the standard protocol without IV contrast. COMPARISON:  None. FINDINGS: CT CHEST FINDINGS Cardiovascular: Heart size normal. No pericardial effusion. Coronary artery atherosclerotic calcifications noted. Mediastinum/Nodes: No enlarged mediastinal, hilar, or axillary lymph nodes. Thyroid gland, trachea, and esophagus demonstrate no significant findings. Lungs/Pleura: No pleural effusion. No airspace consolidation, atelectasis, or pneumothorax. Multiple scattered tiny lung nodules are identified throughout both lungs. These have a nonspecific appearance including: -Nodule within the left upper lobe measures 5 mm, image 39/3. -central right middle lobe lung nodule measures 5 mm, image 96/3. -Right lower lobe lung nodule measures 5 mm, image 96/3. -within the central left lower lobe there is a 5 mm lung nodule, image 99/3. Musculoskeletal: No chest wall mass or suspicious bone lesions identified. CT ABDOMEN PELVIS FINDINGS Hepatobiliary: No focal liver abnormality is seen. No gallstones, gallbladder wall thickening, or biliary dilatation. Pancreas: Unremarkable. No pancreatic ductal dilatation or surrounding inflammatory changes. Spleen: Normal in size without focal abnormality. Adrenals/Urinary Tract: Adrenal glands are unremarkable. Kidneys are normal, without renal calculi, focal lesion, or hydronephrosis. Bladder is unremarkable. Stomach/Bowel: Stomach is within normal limits. Appendix appears normal. No evidence of bowel wall thickening, distention, or inflammatory changes. Vascular/Lymphatic: Aortic atherosclerosis. No enlarged abdominal or pelvic lymph nodes. Reproductive: Prostate is unremarkable. Other: No free fluid or fluid collections. Fat containing umbilical hernia noted. There is also a small fat containing left inguinal hernia. Musculoskeletal: Spondylosis identified within the lumbar spine. This is most advanced  at L5-S1. No acute or suspicious osseous findings. IMPRESSION: 1. No acute findings within the chest, abdomen or pelvis. 2. Small nonspecific pulmonary nodules are identified bilaterally. No follow-up needed if patient is low-risk (and has no known or suspected primary neoplasm). Non-contrast chest CT can be considered in 12 months if patient is high-risk. This recommendation follows the consensus statement: Guidelines for Management of Incidental Pulmonary Nodules Detected on CT Images: From the Fleischner Society 2017; Radiology 2017; 284:228-243. 3. Aortic Atherosclerosis (ICD10-I70.0). Coronary artery calcifications. Electronically Signed   By: Signa Kell M.D.   On: 12/15/2020 14:23   CT Head Wo Contrast  Result Date: 12/15/2020 CLINICAL DATA:  Mental status  change fall. EXAM: CT HEAD WITHOUT CONTRAST CT CERVICAL SPINE WITHOUT CONTRAST TECHNIQUE: Multidetector CT imaging of the head and cervical spine was performed following the standard protocol without intravenous contrast. Multiplanar CT image reconstructions of the cervical spine were also generated. COMPARISON:  CT head 03/10/15 FINDINGS: CT HEAD FINDINGS Brain: No evidence of acute infarction, hemorrhage, hydrocephalus, extra-axial collection or mass lesion/mass effect. There is mild diffuse low-attenuation within the subcortical and periventricular white matter compatible with chronic microvascular disease. Right frontal lobe encephalomalacia identified compatible with remote infarct. Vascular: No hyperdense vessel or unexpected calcification. Status post right MCA territory aneurysm clipping Skull: Changes from remote right frontal craniotomy noted. Sinuses/Orbits: There is the coastal thickening involving the sphenoid sinus and anterior ethmoid air cells as well as the frontal sinuses. Other: None CT CERVICAL SPINE FINDINGS Alignment: Normal. Skull base and vertebrae: No acute fracture. No primary bone lesion or focal pathologic process. Soft  tissues and spinal canal: No prevertebral fluid or swelling. No visible canal hematoma. Disc levels:  Within normal limits. Upper chest: Negative Other: None IMPRESSION: 1. No acute intracranial abnormalities. 2. Chronic small vessel ischemic change. 3. 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 4. No signs of cervical spine fracture or dislocation. 5. Paranasal sinus inflammation. Electronically Signed   By: Signa Kell M.D.   On: 12/15/2020 14:13   CT Chest Wo Contrast  Result Date: 12/15/2020 CLINICAL DATA:  Status post fall. EXAM: CT CHEST, ABDOMEN AND PELVIS WITHOUT CONTRAST TECHNIQUE: Multidetector CT imaging of the chest, abdomen and pelvis was performed following the standard protocol without IV contrast. COMPARISON:  None. FINDINGS: CT CHEST FINDINGS Cardiovascular: Heart size normal. No pericardial effusion. Coronary artery atherosclerotic calcifications noted. Mediastinum/Nodes: No enlarged mediastinal, hilar, or axillary lymph nodes. Thyroid gland, trachea, and esophagus demonstrate no significant findings. Lungs/Pleura: No pleural effusion. No airspace consolidation, atelectasis, or pneumothorax. Multiple scattered tiny lung nodules are identified throughout both lungs. These have a nonspecific appearance including: -Nodule within the left upper lobe measures 5 mm, image 39/3. -central right middle lobe lung nodule measures 5 mm, image 96/3. -Right lower lobe lung nodule measures 5 mm, image 96/3. -within the central left lower lobe there is a 5 mm lung nodule, image 99/3. Musculoskeletal: No chest wall mass or suspicious bone lesions identified. CT ABDOMEN PELVIS FINDINGS Hepatobiliary: No focal liver abnormality is seen. No gallstones, gallbladder wall thickening, or biliary dilatation. Pancreas: Unremarkable. No pancreatic ductal dilatation or surrounding inflammatory changes. Spleen: Normal in size without focal abnormality. Adrenals/Urinary Tract: Adrenal  glands are unremarkable. Kidneys are normal, without renal calculi, focal lesion, or hydronephrosis. Bladder is unremarkable. Stomach/Bowel: Stomach is within normal limits. Appendix appears normal. No evidence of bowel wall thickening, distention, or inflammatory changes. Vascular/Lymphatic: Aortic atherosclerosis. No enlarged abdominal or pelvic lymph nodes. Reproductive: Prostate is unremarkable. Other: No free fluid or fluid collections. Fat containing umbilical hernia noted. There is also a small fat containing left inguinal hernia. Musculoskeletal: Spondylosis identified within the lumbar spine. This is most advanced at L5-S1. No acute or suspicious osseous findings. IMPRESSION: 1. No acute findings within the chest, abdomen or pelvis. 2. Small nonspecific pulmonary nodules are identified bilaterally. No follow-up needed if patient is low-risk (and has no known or suspected primary neoplasm). Non-contrast chest CT can be considered in 12 months if patient is high-risk. This recommendation follows the consensus statement: Guidelines for Management of Incidental Pulmonary Nodules Detected on CT Images: From the Fleischner Society 2017; Radiology 2017; 284:228-243. 3.  Aortic Atherosclerosis (ICD10-I70.0). Coronary artery calcifications. Electronically Signed   By: Signa Kell M.D.   On: 12/15/2020 14:23   CT Cervical Spine Wo Contrast  Result Date: 12/15/2020 CLINICAL DATA:  Mental status change fall. EXAM: CT HEAD WITHOUT CONTRAST CT CERVICAL SPINE WITHOUT CONTRAST TECHNIQUE: Multidetector CT imaging of the head and cervical spine was performed following the standard protocol without intravenous contrast. Multiplanar CT image reconstructions of the cervical spine were also generated. COMPARISON:  CT head 03/10/15 FINDINGS: CT HEAD FINDINGS Brain: No evidence of acute infarction, hemorrhage, hydrocephalus, extra-axial collection or mass lesion/mass effect. There is mild diffuse low-attenuation within the  subcortical and periventricular white matter compatible with chronic microvascular disease. Right frontal lobe encephalomalacia identified compatible with remote infarct. Vascular: No hyperdense vessel or unexpected calcification. Status post right MCA territory aneurysm clipping Skull: Changes from remote right frontal craniotomy noted. Sinuses/Orbits: There is the coastal thickening involving the sphenoid sinus and anterior ethmoid air cells as well as the frontal sinuses. Other: None CT CERVICAL SPINE FINDINGS Alignment: Normal. Skull base and vertebrae: No acute fracture. No primary bone lesion or focal pathologic process. Soft tissues and spinal canal: No prevertebral fluid or swelling. No visible canal hematoma. Disc levels:  Within normal limits. Upper chest: Negative Other: None IMPRESSION: 1. No acute intracranial abnormalities. 2. Chronic small vessel ischemic change. 3. 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 4. No signs of cervical spine fracture or dislocation. 5. Paranasal sinus inflammation. Electronically Signed   By: Signa Kell M.D.   On: 12/15/2020 14:13   DG Pelvis Portable  Result Date: 12/15/2020 CLINICAL DATA:  Level 1 trauma, confusion, fall EXAM: PORTABLE PELVIS 1-2 VIEWS COMPARISON:  Portable exam 1328 hours without priors for comparison FINDINGS: Osseous mineralization normal for technique. Hip and SI joint spaces symmetric. No fracture, dislocation, or bone destruction. IMPRESSION: No acute abnormalities. Electronically Signed   By: Ulyses Southward M.D.   On: 12/15/2020 13:56   DG Chest Port 1 View  Result Date: 12/15/2020 CLINICAL DATA:  Acute respiratory distress. EXAM: PORTABLE CHEST 1 VIEW COMPARISON:  Earlier film, same date. FINDINGS: Stable cardiac enlargement. No acute pulmonary findings. No pleural effusions pulmonary lesions. The bony thorax is intact. IMPRESSION: Cardiac enlargement but no acute pulmonary findings. Electronically  Signed   By: Rudie Meyer M.D.   On: 12/15/2020 21:49   DG Chest Port 1 View  Result Date: 12/15/2020 CLINICAL DATA:  Level 1 trauma, confusion, fall, RIGHT-side chest pain EXAM: PORTABLE CHEST 1 VIEW COMPARISON:  Portable exam 1326 hours compared to 03/03/2015 FINDINGS: Minimal enlargement of cardiac silhouette. Mediastinal contours and pulmonary vascularity normal. Lungs clear. No pulmonary infiltrate, pleural effusion or pneumothorax. No acute osseous findings. IMPRESSION: Minimal enlargement of cardiac silhouette. No acute abnormalities. Electronically Signed   By: Ulyses Southward M.D.   On: 12/15/2020 13:55     LOS: 1 day   Jeoffrey Massed, MD  [facility]    To contact the attending provider between 7A-7P or the covering provider during after hours 7P-7A, please log into the web site [email or web address] and access using universal [place] password for that web site. If you do not have the password, please call the hospital operator.  12/16/2020, 9:23 AM

## 2020-12-16 NOTE — Consult Note (Signed)
Cardiology Consultation:   Patient ID: Daniel Butler MRN: [id number]; DOB: 1966/05/28  Admit date: 12/15/2020 Date of Consult: 12/16/2020  PCP:  Olive Bass, FNP    [facility]  Cardiologist:  Chrystie Nose, MD  Advanced Practice Provider:  No care team member to display Electrophysiologist:  None        Patient Profile:   Daniel Butler is a 55 y.o. male with a hx of hyperlipidemia followed by [name], HTN, brain aneurysm, tobacco use, ETOH use, anxiety/depression and gout with  who is being seen today for the evaluation of atrial fib with RVR at the request of Dr. Jerral Ralph.  History of Present Illness:   Mr. Nottage followed by [name] for hyperlipidemia no hx CAD but + for HTN, tobacco use and ETOH use.   Pt admitted 12/15/20 with unwitnessed fall.  He had a beer and 2 shots prior to fall.  He has been more confused over last week.  No hx of a fib.  On arrival pt in a fib at 131 and BP low at 82/51.  CT head without acute issue.   He was found to be in atrial fib at 131.  Placed on amiodarone.  CT chest with pulmonary nodules and aortic atherosclerosis wit coronary artery calcifications.  Cr elevated at 2.08, BUN 23, AST 49, ALT 23, ammonia 33. INR 1.2 ETOH level 237.   BNP was 107 on admit , blood cultures done.  PCXR:  IMPRESSION: Minimal enlargement of cardiac silhouette. No acute abnormalities.  Today Na 147, K+ 4.6 Cr 1.75 AST 57 and ALT 24.  Improved BNP 129 CK 377  TSH 2.377  Neg COVID  Neg hepatitis.  EKG:  The EKG was personally reviewed and demonstrates:  Atrial fib at 135 no acute ST changes.  Telemetry:  Telemetry was personally reviewed and demonstrates:  A fib with RVR  BP 138/98 afebrile Resp 19 to 24  Echo ordered Dr. Eden Emms has seen  Past Medical History:  Diagnosis Date  . Brain aneurysm   . Chicken pox   . Depression   . Gout   . High cholesterol   . Hypertension     Past Surgical History:   Procedure Laterality Date  . CRANIOTOMY Right 03/02/2015   Procedure: CRANIOTOMY INTRACRANIAL  ANEURYSM FOR CLIPPING;  Surgeon: Lisbeth Renshaw, MD;  Location: [facility];  Service: Neurosurgery;  Laterality: Right;  . IR GENERIC HISTORICAL  02/16/2016   IR ANGIO VERTEBRAL SEL VERTEBRAL BILAT MOD SED 02/16/2016 Lisbeth Renshaw, MD [facility]  . IR GENERIC HISTORICAL  02/16/2016   IR ANGIO INTRA EXTRACRAN SEL INTERNAL CAROTID BILAT MOD SED 02/16/2016 Lisbeth Renshaw, MD [facility]  . RADIOLOGY WITH ANESTHESIA N/A 03/02/2015   Procedure: RADIOLOGY WITH ANESTHESIA;  Surgeon: Lisbeth Renshaw, MD;  Location: [facility];  Service: Radiology;  Laterality: N/A;  . WISDOM TOOTH EXTRACTION       Home Medications:  Prior to Admission medications   Medication Sig Start Date End Date Taking? Authorizing Provider  allopurinol (ZYLOPRIM) 100 MG tablet Take 1 tablet (100 mg total) by mouth daily. 12/11/20  Yes Olive Bass, FNP  ALPRAZolam Prudy Feeler) 0.5 MG tablet TAKE 1 TABLET BY MOUTH THREE TIMES A DAY AS NEEDED FOR ANXIETY DUE 11-14-2020 Patient taking differently: Take 0.5 mg by mouth 3 (three) times daily as needed for anxiety. 12/11/20  Yes Corwin Levins, MD  fenofibrate (TRICOR) 145 MG tablet Take 1 tablet (145 mg total)  by mouth daily. 12/11/20  Yes Olive Bass, FNP  ibuprofen (ADVIL) 200 MG tablet Take 200-400 mg by mouth every 6 (six) hours as needed for mild pain (or headaches).   Yes [provider]  icosapent Ethyl (VASCEPA) 1 g capsule Take 2 capsules (2 g total) by mouth 2 (two) times daily. 11/11/19  Yes Hilty, Lisette Abu, MD  losartan (COZAAR) 100 MG tablet Take 1 tablet (100 mg total) by mouth daily. 12/11/20  Yes Olive Bass, FNP  rosuvastatin (CRESTOR) 40 MG tablet TAKE 1 TABLET BY MOUTH ONCE DAILY. PATIENT NEEDS OV FOR FURTHER REFILLS Patient taking differently: Take 40 mg by mouth daily. 12/11/20  Yes Hilty, Lisette Abu, MD    Inpatient Medications: Scheduled  Meds: . carvedilol  3.125 mg Oral BID WC  . chlordiazePOXIDE  10 mg Oral TID  . enoxaparin (LOVENOX) injection  60 mg Subcutaneous Q24H  . fenofibrate  160 mg Oral Daily  . folic acid  1 mg Oral Daily  . icosapent Ethyl  2 g Oral BID  . LORazepam  0-4 mg Intravenous Q6H   Followed by  . [START ON 12/17/2020] LORazepam  0-4 mg Intravenous Q12H  . multivitamin with minerals  1 tablet Oral Daily  . rosuvastatin  40 mg Oral Daily  . thiamine  100 mg Oral Daily   Or  . thiamine  100 mg Intravenous Daily   Continuous Infusions: . amiodarone 30 mg/hr (12/16/20 1018)   PRN Meds: albuterol, levalbuterol, LORazepam **OR** LORazepam  Allergies:    Allergies  Allergen Reactions  . Zetia [Ezetimibe] Nausea And Vomiting    Social History:   Social History   Socioeconomic History  . Marital status: Married    Spouse name: Not on file  . Number of children: 0  . Years of education: 80  . Highest education level: Not on file  Occupational History  . Occupation: Social research officer, government  Tobacco Use  . Smoking status: Current Every Day Smoker    Packs/day: 1.00    Years: 25.00    Pack years: 25.00    Types: Cigarettes  . Smokeless tobacco: Never Used  Vaping Use  . Vaping Use: Never used  Substance and Sexual Activity  . Alcohol use: Yes    Alcohol/week: 35.0 standard drinks    Types: 35 Cans of beer per week  . Drug use: No  . Sexual activity: Not on file  Other Topics Concern  . Not on file  Social History Narrative   Denies religious beliefs effecting health care.    Social Determinants of Health   Financial Resource Strain: Low Risk   . Difficulty of Paying Living Expenses: Not hard at all  Food Insecurity: No Food Insecurity  . Worried About Programme researcher, broadcasting/film/video in the Last Year: Never true  . Ran Out of Food in the Last Year: Never true  Transportation Needs: No Transportation Needs  . Lack of Transportation (Medical): No  . Lack of Transportation (Non-Medical): No   Physical Activity: Not on file  Stress: Not on file  Social Connections: Not on file  Intimate Partner Violence: Not on file    Family History:    Family History  Adopted: Yes     ROS:  Please see the history of present illness.  General:no colds or fevers, no weight changes Skin:no rashes or ulcers HEENT:no blurred vision, no congestion CV:see HPI PUL:see HPI GI:no diarrhea constipation or melena, no indigestion GU:no hematuria, no dysuria MS:no joint  pain, no claudication hx of multiple falls over last 2 years. Neuro:no syncope, no lightheadedness, hx craniotomy  Endo:no diabetes, no thyroid disease  All other ROS reviewed and negative.     Physical Exam/Data:   Vitals:   12/15/20 2331 12/16/20 0322 12/16/20 0728 12/16/20 0908  BP: 102/63 (!) 143/85 131/87 (!) 138/98  Pulse: (!) 121 90 (!) 112 (!) 52  Resp: 17 19 (!) 26 19  Temp: 97.6 F (36.4 C) 98.1 F (36.7 C) 99.5 F (37.5 C) 97.7 F (36.5 C)  TempSrc: Oral Oral Axillary Oral  SpO2: 94% 97% 95% 94%  Weight:  128.2 kg    Height:        Intake/Output Summary (Last 24 hours) at 12/16/2020 1154 Last data filed at 12/16/2020 0648 Gross per 24 hour  Intake 668.07 ml  Output 4500 ml  Net -3831.93 ml   Last 3 Weights 12/16/2020 12/15/2020 11/28/2020  Weight (lbs) 282 lb 10.1 oz 265 lb 267 lb  Weight (kg) 128.2 kg 120.203 kg 121.11 kg     Body mass index is 36.29 kg/m.  Exam per Dr. Eden Emms General:  Well nourished, well developed, in no acute distress HEENT: normal Lymph: no adenopathy Neck: no JVD Endocrine:  No thryomegaly Vascular: No carotid bruits; FA pulses 2+ bilaterally without bruits  Cardiac:  normal S1, S2; RRR; no murmur  Lungs:  clear to auscultation bilaterally, no wheezing, rhonchi or rales  Abd: soft, nontender, no hepatomegaly  Ext: no edema Musculoskeletal:  No deformities, BUE and BLE strength normal and equal Skin: warm and dry  Neuro:  CNs 2-12 intact, no focal abnormalities  noted Psych:  Normal affect     Relevant CV Studies: None echo pending  Laboratory Data:  High Sensitivity Troponin:  No results for input(s): TROPONINIHS in the last 720 hours.   Chemistry Recent Labs  Lab 12/15/20 1334 12/16/20 0136  NA 142 147*  K 3.7 4.6  CL 110 115*  CO2 24 19*  GLUCOSE 108* 90  BUN 23* 18  CREATININE 2.08* 1.75*  CALCIUM 8.3* 8.9  GFRNONAA 37* 45*  ANIONGAP 8 13    Recent Labs  Lab 12/15/20 1334 12/16/20 0136  PROT 5.9* 6.3*  ALBUMIN 3.4* 3.4*  AST 49* 57*  ALT 23 24  ALKPHOS 26* 27*  BILITOT 0.8 1.1   Hematology Recent Labs  Lab 12/15/20 1334 12/16/20 0136  WBC 6.6 7.2  RBC 3.86* 4.33  HGB 14.0 15.4  HCT 42.9 47.5  MCV 111.1* 109.7*  MCH 36.3* 35.6*  MCHC 32.6 32.4  RDW 14.6 15.7*  PLT 232 227   BNP Recent Labs  Lab 12/15/20 1334 12/15/20 2221  BNP 107.5* 129.6*    DDimer  Recent Labs  Lab 12/15/20 1334  DDIMER 0.61*     Radiology/Studies:  CT ABDOMEN PELVIS WO CONTRAST  Result Date: 12/15/2020 CLINICAL DATA:  Status post fall. EXAM: CT CHEST, ABDOMEN AND PELVIS WITHOUT CONTRAST TECHNIQUE: Multidetector CT imaging of the chest, abdomen and pelvis was performed following the standard protocol without IV contrast. COMPARISON:  None. FINDINGS: CT CHEST FINDINGS Cardiovascular: Heart size normal. No pericardial effusion. Coronary artery atherosclerotic calcifications noted. Mediastinum/Nodes: No enlarged mediastinal, hilar, or axillary lymph nodes. Thyroid gland, trachea, and esophagus demonstrate no significant findings. Lungs/Pleura: No pleural effusion. No airspace consolidation, atelectasis, or pneumothorax. Multiple scattered tiny lung nodules are identified throughout both lungs. These have a nonspecific appearance including: -Nodule within the left upper lobe measures 5 mm, image 39/3. -central right middle  lobe lung nodule measures 5 mm, image 96/3. -Right lower lobe lung nodule measures 5 mm, image 96/3. -within the  central left lower lobe there is a 5 mm lung nodule, image 99/3. Musculoskeletal: No chest wall mass or suspicious bone lesions identified. CT ABDOMEN PELVIS FINDINGS Hepatobiliary: No focal liver abnormality is seen. No gallstones, gallbladder wall thickening, or biliary dilatation. Pancreas: Unremarkable. No pancreatic ductal dilatation or surrounding inflammatory changes. Spleen: Normal in size without focal abnormality. Adrenals/Urinary Tract: Adrenal glands are unremarkable. Kidneys are normal, without renal calculi, focal lesion, or hydronephrosis. Bladder is unremarkable. Stomach/Bowel: Stomach is within normal limits. Appendix appears normal. No evidence of bowel wall thickening, distention, or inflammatory changes. Vascular/Lymphatic: Aortic atherosclerosis. No enlarged abdominal or pelvic lymph nodes. Reproductive: Prostate is unremarkable. Other: No free fluid or fluid collections. Fat containing umbilical hernia noted. There is also a small fat containing left inguinal hernia. Musculoskeletal: Spondylosis identified within the lumbar spine. This is most advanced at L5-S1. No acute or suspicious osseous findings. IMPRESSION: 1. No acute findings within the chest, abdomen or pelvis. 2. Small nonspecific pulmonary nodules are identified bilaterally. No follow-up needed if patient is low-risk (and has no known or suspected primary neoplasm). Non-contrast chest CT can be considered in 12 months if patient is high-risk. This recommendation follows the consensus statement: Guidelines for Management of Incidental Pulmonary Nodules Detected on CT Images: From the Fleischner Society 2017; Radiology 2017; 284:[id number]. 3. Aortic Atherosclerosis (ICD10-I70.0). Coronary artery calcifications. Electronically Signed   By: Signa Kellaylor  Stroud M.D.   On: 12/15/2020 14:23   CT Head Wo Contrast  Result Date: 12/15/2020 CLINICAL DATA:  Mental status change fall. EXAM: CT HEAD WITHOUT CONTRAST CT CERVICAL SPINE WITHOUT CONTRAST  TECHNIQUE: Multidetector CT imaging of the head and cervical spine was performed following the standard protocol without intravenous contrast. Multiplanar CT image reconstructions of the cervical spine were also generated. COMPARISON:  CT head 03/10/15 FINDINGS: CT HEAD FINDINGS Brain: No evidence of acute infarction, hemorrhage, hydrocephalus, extra-axial collection or mass lesion/mass effect. There is mild diffuse low-attenuation within the subcortical and periventricular white matter compatible with chronic microvascular disease. Right frontal lobe encephalomalacia identified compatible with remote infarct. Vascular: No hyperdense vessel or unexpected calcification. Status post right MCA territory aneurysm clipping Skull: Changes from remote right frontal craniotomy noted. Sinuses/Orbits: There is the coastal thickening involving the sphenoid sinus and anterior ethmoid air cells as well as the frontal sinuses. Other: None CT CERVICAL SPINE FINDINGS Alignment: Normal. Skull base and vertebrae: No acute fracture. No primary bone lesion or focal pathologic process. Soft tissues and spinal canal: No prevertebral fluid or swelling. No visible canal hematoma. Disc levels:  Within normal limits. Upper chest: Negative Other: None IMPRESSION: 1. No acute intracranial abnormalities. 2. Chronic small vessel ischemic change. 3. 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 4. No signs of cervical spine fracture or dislocation. 5. Paranasal sinus inflammation. Electronically Signed   By: Signa Kellaylor  Stroud M.D.   On: 12/15/2020 14:13   CT Chest Wo Contrast  Result Date: 12/15/2020 CLINICAL DATA:  Status post fall. EXAM: CT CHEST, ABDOMEN AND PELVIS WITHOUT CONTRAST TECHNIQUE: Multidetector CT imaging of the chest, abdomen and pelvis was performed following the standard protocol without IV contrast. COMPARISON:  None. FINDINGS: CT CHEST FINDINGS Cardiovascular: Heart size normal. No pericardial  effusion. Coronary artery atherosclerotic calcifications noted. Mediastinum/Nodes: No enlarged mediastinal, hilar, or axillary lymph nodes. Thyroid gland, trachea, and esophagus demonstrate no significant findings. Lungs/Pleura: No pleural effusion.  No airspace consolidation, atelectasis, or pneumothorax. Multiple scattered tiny lung nodules are identified throughout both lungs. These have a nonspecific appearance including: -Nodule within the left upper lobe measures 5 mm, image 39/3. -central right middle lobe lung nodule measures 5 mm, image 96/3. -Right lower lobe lung nodule measures 5 mm, image 96/3. -within the central left lower lobe there is a 5 mm lung nodule, image 99/3. Musculoskeletal: No chest wall mass or suspicious bone lesions identified. CT ABDOMEN PELVIS FINDINGS Hepatobiliary: No focal liver abnormality is seen. No gallstones, gallbladder wall thickening, or biliary dilatation. Pancreas: Unremarkable. No pancreatic ductal dilatation or surrounding inflammatory changes. Spleen: Normal in size without focal abnormality. Adrenals/Urinary Tract: Adrenal glands are unremarkable. Kidneys are normal, without renal calculi, focal lesion, or hydronephrosis. Bladder is unremarkable. Stomach/Bowel: Stomach is within normal limits. Appendix appears normal. No evidence of bowel wall thickening, distention, or inflammatory changes. Vascular/Lymphatic: Aortic atherosclerosis. No enlarged abdominal or pelvic lymph nodes. Reproductive: Prostate is unremarkable. Other: No free fluid or fluid collections. Fat containing umbilical hernia noted. There is also a small fat containing left inguinal hernia. Musculoskeletal: Spondylosis identified within the lumbar spine. This is most advanced at L5-S1. No acute or suspicious osseous findings. IMPRESSION: 1. No acute findings within the chest, abdomen or pelvis. 2. Small nonspecific pulmonary nodules are identified bilaterally. No follow-up needed if patient is low-risk  (and has no known or suspected primary neoplasm). Non-contrast chest CT can be considered in 12 months if patient is high-risk. This recommendation follows the consensus statement: Guidelines for Management of Incidental Pulmonary Nodules Detected on CT Images: From the Fleischner Society 2017; Radiology 2017; [id number]. 3. Aortic Atherosclerosis (ICD10-I70.0). Coronary artery calcifications. Electronically Signed   By: Signa Kell M.D.   On: 12/15/2020 14:23   CT Cervical Spine Wo Contrast  Result Date: 12/15/2020 CLINICAL DATA:  Mental status change fall. EXAM: CT HEAD WITHOUT CONTRAST CT CERVICAL SPINE WITHOUT CONTRAST TECHNIQUE: Multidetector CT imaging of the head and cervical spine was performed following the standard protocol without intravenous contrast. Multiplanar CT image reconstructions of the cervical spine were also generated. COMPARISON:  CT head 03/10/15 FINDINGS: CT HEAD FINDINGS Brain: No evidence of acute infarction, hemorrhage, hydrocephalus, extra-axial collection or mass lesion/mass effect. There is mild diffuse low-attenuation within the subcortical and periventricular white matter compatible with chronic microvascular disease. Right frontal lobe encephalomalacia identified compatible with remote infarct. Vascular: No hyperdense vessel or unexpected calcification. Status post right MCA territory aneurysm clipping Skull: Changes from remote right frontal craniotomy noted. Sinuses/Orbits: There is the coastal thickening involving the sphenoid sinus and anterior ethmoid air cells as well as the frontal sinuses. Other: None CT CERVICAL SPINE FINDINGS Alignment: Normal. Skull base and vertebrae: No acute fracture. No primary bone lesion or focal pathologic process. Soft tissues and spinal canal: No prevertebral fluid or swelling. No visible canal hematoma. Disc levels:  Within normal limits. Upper chest: Negative Other: None IMPRESSION: 1. No acute intracranial abnormalities. 2. Chronic small  vessel ischemic change. 3. 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 4. No signs of cervical spine fracture or dislocation. 5. Paranasal sinus inflammation. Electronically Signed   By: Signa Kell M.D.   On: 12/15/2020 14:13   DG Pelvis Portable  Result Date: 12/15/2020 CLINICAL DATA:  Level 1 trauma, confusion, fall EXAM: PORTABLE PELVIS 1-2 VIEWS COMPARISON:  Portable exam 1328 hours without priors for comparison FINDINGS: Osseous mineralization normal for technique. Hip and SI joint spaces symmetric. No fracture, dislocation, or  bone destruction. IMPRESSION: No acute abnormalities. Electronically Signed   By: Ulyses Southward M.D.   On: 12/15/2020 13:56   DG Chest Port 1 View  Result Date: 12/15/2020 CLINICAL DATA:  Acute respiratory distress. EXAM: PORTABLE CHEST 1 VIEW COMPARISON:  Earlier film, same date. FINDINGS: Stable cardiac enlargement. No acute pulmonary findings. No pleural effusions pulmonary lesions. The bony thorax is intact. IMPRESSION: Cardiac enlargement but no acute pulmonary findings. Electronically Signed   By: Rudie Meyer M.D.   On: 12/15/2020 21:49   DG Chest Port 1 View  Result Date: 12/15/2020 CLINICAL DATA:  Level 1 trauma, confusion, fall, RIGHT-side chest pain EXAM: PORTABLE CHEST 1 VIEW COMPARISON:  Portable exam 1326 hours compared to 03/03/2015 FINDINGS: Minimal enlargement of cardiac silhouette. Mediastinal contours and pulmonary vascularity normal. Lungs clear. No pulmonary infiltrate, pleural effusion or pneumothorax. No acute osseous findings. IMPRESSION: Minimal enlargement of cardiac silhouette. No acute abnormalities. Electronically Signed   By: Ulyses Southward M.D.   On: 12/15/2020 13:55     Assessment and Plan:   1. Atrial fib with RVR continue amio , echo pending, first atrial fib. TSH normal.  Pt does drink a fair amount of ETOH  On amiodarone drip.  No prior hx CAD or atrial fib Dr. Eden Emms has seen.  Not good candidate for  anticoagulation  2. CAD seen on CT of chest  3. AKI on admit improving. After treatment. 4. Lower ext edema possibly due to HR and abnormal EF with rapid HR. 5. Acute toxic metabolic encephalopathy due to ETOH     Risk Assessment/Risk Scores:          CHA2DS2-VASc Score = 1  This indicates a 0.6% annual risk of stroke. The patient's score is based upon: CHF History: No HTN History: Yes Diabetes History: No Stroke History: No Vascular Disease History: No Age Score: 0 Gender Score: 0          For questions or updates, please contact [facility] Please consult [email or web address] for contact info under    Signed, Nada Boozer, NP  12/16/2020 11:54 AM

## 2020-12-16 NOTE — Progress Notes (Signed)
Pt began choking on a sip on water and became hypoxic Spo2 60's on 12/15/20 at 2045. Encouraged coughing and placed pt on NRB. Rapid RN called and MD paged. New orders were provided and monitoring pt. Pt stable now BP 102/63 Spo2 93 2L .

## 2020-12-17 ENCOUNTER — Other Ambulatory Visit (HOSPITAL_COMMUNITY): Payer: Self-pay

## 2020-12-17 ENCOUNTER — Inpatient Hospital Stay (HOSPITAL_COMMUNITY): Payer: Self-pay

## 2020-12-17 DIAGNOSIS — I4891 Unspecified atrial fibrillation: Secondary | ICD-10-CM

## 2020-12-17 LAB — CBC
HCT: 42.9 % (ref 39.0–52.0)
Hemoglobin: 14.3 g/dL (ref 13.0–17.0)
MCH: 36 pg — ABNORMAL HIGH (ref 26.0–34.0)
MCHC: 33.3 g/dL (ref 30.0–36.0)
MCV: 108.1 fL — ABNORMAL HIGH (ref 80.0–100.0)
Platelets: 196 10*3/uL (ref 150–400)
RBC: 3.97 MIL/uL — ABNORMAL LOW (ref 4.22–5.81)
RDW: 15 % (ref 11.5–15.5)
WBC: 8.6 10*3/uL (ref 4.0–10.5)
nRBC: 0 % (ref 0.0–0.2)

## 2020-12-17 LAB — ECHOCARDIOGRAM COMPLETE
Height: 74 in
S' Lateral: 3.9 cm
Weight: 4649.06 oz

## 2020-12-17 LAB — COMPREHENSIVE METABOLIC PANEL
ALT: 21 U/L (ref 0–44)
AST: 35 U/L (ref 15–41)
Albumin: 3.1 g/dL — ABNORMAL LOW (ref 3.5–5.0)
Alkaline Phosphatase: 28 U/L — ABNORMAL LOW (ref 38–126)
Anion gap: 7 (ref 5–15)
BUN: 17 mg/dL (ref 6–20)
CO2: 26 mmol/L (ref 22–32)
Calcium: 8.3 mg/dL — ABNORMAL LOW (ref 8.9–10.3)
Chloride: 107 mmol/L (ref 98–111)
Creatinine, Ser: 1.66 mg/dL — ABNORMAL HIGH (ref 0.61–1.24)
GFR, Estimated: 48 mL/min — ABNORMAL LOW (ref >60)
Glucose, Bld: 98 mg/dL (ref 70–99)
Potassium: 3.9 mmol/L (ref 3.5–5.1)
Sodium: 140 mmol/L (ref 135–145)
Total Bilirubin: 1.3 mg/dL — ABNORMAL HIGH (ref 0.3–1.2)
Total Protein: 5.7 g/dL — ABNORMAL LOW (ref 6.5–8.1)

## 2020-12-17 MED ORDER — METOPROLOL TARTRATE 5 MG/5ML IV SOLN: 5.0000 mg | Freq: Three times a day (TID) | INTRAVENOUS | Status: DC | PRN

## 2020-12-17 MED ORDER — CARVEDILOL 6.25 MG PO TABS: 6.2500 mg | ORAL_TABLET | Freq: Two times a day (BID) | ORAL | Status: DC

## 2020-12-17 MED ORDER — METOPROLOL TARTRATE 50 MG PO TABS
50.0000 mg | ORAL_TABLET | Freq: Two times a day (BID) | ORAL | Status: DC
  Administered 2020-12-17 (×2): 50 mg via ORAL
  Filled 2020-12-17 (×2): qty 1

## 2020-12-17 MED ORDER — METOPROLOL TARTRATE 100 MG PO TABS: 100.0000 mg | ORAL_TABLET | Freq: Two times a day (BID) | ORAL | Status: DC

## 2020-12-17 MED ORDER — SPIRONOLACTONE 25 MG PO TABS
25.0000 mg | ORAL_TABLET | Freq: Once | ORAL | Status: AC
  Administered 2020-12-17: 25 mg via ORAL
  Filled 2020-12-17: qty 1

## 2020-12-17 MED ORDER — FUROSEMIDE 20 MG PO TABS
20.0000 mg | ORAL_TABLET | Freq: Once | ORAL | Status: AC
  Administered 2020-12-17: 20 mg via ORAL
  Filled 2020-12-17: qty 1

## 2020-12-17 NOTE — Progress Notes (Signed)
RN called for amiodarone IV infiltration into tissue.  She will resume at different site.  Treat as any IV infiltration.

## 2020-12-17 NOTE — Progress Notes (Signed)
OT Cancellation Note  Patient Details Name: NICHOLSON STARACE MRN: [id number] DOB: 15-Jul-1966   Cancelled Treatment:    Reason Eval/Treat Not Completed: Patient at procedure or test/ unavailable (ultrasound)  [facility] Kuulei Kleier, OT/L   Acute OT Clinical Specialist Acute Rehabilitation Services Pager [phone] Office [phone]  12/17/2020, 4:06 PM

## 2020-12-17 NOTE — Progress Notes (Signed)
PROGRESS NOTE        PATIENT DETAILS Name: Daniel Butler Age: 55 y.o. Sex: male Date of Birth: December 18, 1965 Admit Date: 12/15/2020 Admitting Physician Clydie Braun, MD WGN:[id number], Allyne Gee, FNP  Brief Narrative: Patient is a 55 y.o. male significant past medical history of alcohol abuse, SAH-s/p craniotomy with clipping in 2016, HTN, HLD, depression/anxiety-who presented to the hospital in the setting of a mechanical fall due to alcohol intoxication.  In the ED-patient was evaluated by trauma services-and found to have no major injuries.  However he was found to have A. fib with RVR, significant lower extremity edema, AKI-started on amiodarone infusion and admitted to the hospitalist service.  See below for further details.  Significant events: 4/8>> fall-alcohol intoxication-brought to ED-found to be in RVR/AKI/lower extremity edema.  Admit to [facility]  Significant studies: 4/8>> CT head: No intracranial abnormalities 4/8>> CT C-spine: No signs of cervical fracture or dislocation 4/8>> CT chest: No acute findings in the chest-nonspecific pulmonary nodules bilaterally 4/8>> CT abdomen/pelvis: No acute injury/acute findings 4/8>> EtOH level: 237 4/8>> acute hepatitis serology: Negative 4/8>> TSH: 2.37  Antimicrobial therapy: None  Microbiology data: 4/8>> blood culture: Pending 4/8>> urine culture: Pending  Procedures : None  Consults: Trauma, cardiology  DVT Prophylaxis : SCDs Start: 12/15/20 1557  Prophylactic Lovenox   Subjective:  Patient in bed, appears comfortable, denies any headache, no fever, no chest pain or pressure, no shortness of breath , no abdominal pain. No new focal weakness.  He is complaining that the nursing staff last night did not treat him well, requested to discuss it with the department director at the charge nurse.    Assessment/Plan:  Mechanical fall: Likely due to alcohol intoxication and use of Xanax-await   PT/OT eval.  Imaging negative for fractures/acute injuries.  Per spouse-has had multiple falls over the past 2 weeks.  A. fib with RVR: Remains in RVR-continue amiodarone infusion-await echocardiogram.  TSH within normal limits.  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  [place] vas 2 score of at least 1, not a good long-term candidate for anticoagulation given history of alcohol use/falls-and prior SAH.  Echo pending, cardiology following.  AKI: Likely hemodynamically mediated-patient had transient hypotension in the emergency room-furthermore he also appears to be on losartan.  UA negative for proteinuria-CT abdomen negative for hydronephrosis-creatinine improving with supportive care.  Lower extremity edema: Probably related to possible underlying CHF-echo pending.  UA negative for proteinuria-although has history of alcohol use-liver does not appear nodular on CT.  Furthermore-INR normal and albumin levels only borderline low.  Gentle diuretic on 12/17/2020, TED stockings ordered.  Acute toxic metabolic encephalopathy: Due to alcohol intoxication-AKI-mentation has improved.  Answering most of my questions appropriately.  ?  Dysphagia: Choking spell this a.m.-keep n.p.o.-await SLP eval.  Lactic acidosis: Due to hypoperfusion in the setting of hypotension-no indication of sepsis.  Mild transaminitis: Probably mild alcoholic hepatitis-acute hepatitis serology negative.  No signs of cirrhosis on imaging.  HLD: Remains on fenofibrate, Vascepa, Crestor-watch LFTs closely.  HTN: BP stable-losartan on hold due to AKI.  Anxiety: On benzodiazepine per CIWA protocol.  Per spouse-takes Xanax multiple times a day-we will go ahead and place him on scheduled benzodiazepines-we will use Librium-and taper slowly over the next few days.  Alcohol abuse: Has somewhat of a flushed appearance-but no tremors-is awake/alert-on Ativan per CIWA  protocol.  Remains at risk for progression to DTs.  Last  drink was 4/8.  Drinks ATLEAST 6 pack of beer daily + 2-3 shots of liquor+daily Xanax use, has been counseled to quit alcohol.  Bilateral pulmonary nodules: Per radiology-repeat CT chest in 12 months  Debility/deconditioning/multiple falls: Await PT/OT eval  Morbid Obesity: Estimated body mass index is 37.31 kg/m as calculated from the following:   Height as of this encounter: 6\' 2"  (1.88 m).   Weight as of this encounter: 131.8 kg.    Diet: Diet Order            Diet regular Room service appropriate? Yes with Assist; Fluid consistency: Thin  Diet effective now                  Code Status:  Full code   Family Communication:  Spouse-[name]- [phone]- over the phone 12/17/20  Disposition Plan:  Status is: Inpatient  Remains inpatient appropriate because:Inpatient level of care appropriate due to severity of illness   Dispo: The patient is from: Home              Anticipated d/c is to: Home              Patient currently is not medically stable to d/c.   Difficult to place patient No    Barriers to Discharge:   Afib RVR-on amiodarone infusion  Antimicrobial agents:  Anti-infectives (From admission, onward)   Start     Dose/Rate Route Frequency Ordered Stop   12/15/20 1400  vancomycin (VANCOREADY) IVPB 2000 mg/400 mL        2,000 mg 200 mL/hr over 120 Minutes Intravenous  Once 12/15/20 1358 12/15/20 1621   12/15/20 1400  ceFEPIme (MAXIPIME) 2 g in sodium chloride 0.9 % 100 mL IVPB        2 g 200 mL/hr over 30 Minutes Intravenous  Once 12/15/20 1358 12/15/20 1447       Time spent: 35 minutes-Greater than 50% of this time was spent in counseling, explanation of diagnosis, planning of further management, and coordination of care.  MEDICATIONS: Scheduled Meds: . chlordiazePOXIDE  10 mg Oral TID  . enoxaparin (LOVENOX) injection  60 mg Subcutaneous Q24H  . fenofibrate  160 mg Oral Daily  . folic acid  1 mg Oral Daily  . icosapent Ethyl  2 g Oral BID  .  LORazepam  0-4 mg Intravenous Q6H   Followed by  . LORazepam  0-4 mg Intravenous Q12H  . metoprolol tartrate  100 mg Oral BID  . multivitamin with minerals  1 tablet Oral Daily  . rosuvastatin  40 mg Oral Daily  . thiamine  100 mg Intravenous Daily   Continuous Infusions: . amiodarone 30 mg/hr (12/16/20 2144)   PRN Meds:.albuterol, levalbuterol, LORazepam **OR** LORazepam, metoprolol tartrate   PHYSICAL EXAM: Vital signs: Vitals:   12/16/20 1901 12/16/20 2330 12/17/20 0336 12/17/20 0756  BP: 113/87 118/84 125/87 (!) 125/98  Pulse: (!) 108 (!) 107 (!) 105 71  Resp: (!) 22 (!) 21 (!) 21   Temp: 99 F (37.2 C) 98.4 F (36.9 C) 98.6 F (37 C)   TempSrc: Axillary Oral Oral   SpO2: 98% 98% 99%   Weight:   131.8 kg   Height:       Filed Weights   12/15/20 1307 12/16/20 0322 12/17/20 0336  Weight: 120.2 kg 128.2 kg 131.8 kg   Body mass index is 37.31 kg/m.   Gen Exam:  Awake, minimally  confused, no new F.N deficits,  [place].AT,PERRAL Supple Neck,No JVD, No cervical lymphadenopathy appriciated.  Symmetrical Chest wall movement, Good air movement bilaterally, CTAB iRRR,No Gallops, Rubs or new Murmurs, No Parasternal Heave +ve B.Sounds, Abd Soft, No tenderness, No organomegaly appriciated, No rebound - guarding or rigidity. No Cyanosis, 2+ leg edema   I have personally reviewed following labs and imaging studies  LABORATORY DATA: CBC: Recent Labs  Lab 12/15/20 1334 12/16/20 0136 12/17/20 0112  WBC 6.6 7.2 8.6  NEUTROABS 3.8  --   --   HGB 14.0 15.4 14.3  HCT 42.9 47.5 42.9  MCV 111.1* 109.7* 108.1*  PLT 232 227 196    Basic Metabolic Panel: Recent Labs  Lab 12/15/20 1334 12/15/20 1552 12/16/20 0136 12/17/20 0112  NA 142  --  147* 140  K 3.7  --  4.6 3.9  CL 110  --  115* 107  CO2 24  --  19* 26  GLUCOSE 108*  --  90 98  BUN 23*  --  18 17  CREATININE 2.08*  --  1.75* 1.66*  CALCIUM 8.3*  --  8.9 8.3*  MG  --  2.2  --   --   PHOS  --  3.3  --   --      GFR: Estimated Creatinine Clearance: 72.5 mL/min (A) (by C-G formula based on SCr of 1.66 mg/dL (H)).  Liver Function Tests: Recent Labs  Lab 12/15/20 1334 12/16/20 0136 12/17/20 0112  AST 49* 57* 35  ALT 23 24 21   ALKPHOS 26* 27* 28*  BILITOT 0.8 1.1 1.3*  PROT 5.9* 6.3* 5.7*  ALBUMIN 3.4* 3.4* 3.1*   No results for input(s): LIPASE, AMYLASE in the last 168 hours. Recent Labs  Lab 12/15/20 1334  AMMONIA 33    Coagulation Profile: Recent Labs  Lab 12/15/20 1334  INR 1.2    Cardiac Enzymes: Recent Labs  Lab 12/15/20 1552  CKTOTAL 377    BNP (last 3 results) No results for input(s): PROBNP in the last 8760 hours.  Lipid Profile: No results for input(s): CHOL, HDL, LDLCALC, TRIG, CHOLHDL, LDLDIRECT in the last 72 hours.  Thyroid Function Tests: Recent Labs    12/15/20 1557  TSH 2.377    Anemia Panel: No results for input(s): VITAMINB12, FOLATE, FERRITIN, TIBC, IRON, RETICCTPCT in the last 72 hours.  Urine analysis:    Component Value Date/Time   COLORURINE YELLOW 12/15/2020 1422   APPEARANCEUR CLEAR 12/15/2020 1422   LABSPEC 1.006 12/15/2020 1422   PHURINE 6.0 12/15/2020 1422   GLUCOSEU NEGATIVE 12/15/2020 1422   HGBUR NEGATIVE 12/15/2020 1422   BILIRUBINUR NEGATIVE 12/15/2020 1422   KETONESUR NEGATIVE 12/15/2020 1422   PROTEINUR NEGATIVE 12/15/2020 1422   UROBILINOGEN 0.2 03/02/2015 1337   NITRITE NEGATIVE 12/15/2020 1422   LEUKOCYTESUR NEGATIVE 12/15/2020 1422    Sepsis Labs: Lactic Acid, Venous    Component Value Date/Time   LATICACIDVEN 3.1 (HH) 12/15/2020 1552    MICROBIOLOGY: Recent Results (from the past 240 hour(s))  Blood culture (routine single)     Status: None (Preliminary result)   Collection Time: 12/15/20  1:20 PM   Specimen: BLOOD  Result Value Ref Range Status   Specimen Description BLOOD SITE NOT SPECIFIED  Final   Special Requests   Final    BOTTLES DRAWN AEROBIC AND ANAEROBIC Blood Culture results may not be  optimal due to an inadequate volume of blood received in culture bottles   Culture   Final    NO  GROWTH < 24 HOURS Performed at [facility], [address] [place], [place] [address]    Report Status PENDING  Incomplete  Urine culture     Status: None   Collection Time: 12/15/20  1:20 PM   Specimen: In/Out Cath Urine  Result Value Ref Range Status   Specimen Description IN/OUT CATH URINE  Final   Special Requests NONE  Final   Culture   Final    NO GROWTH Performed at [facility], [address], [place], [place] [address]    Report Status 12/16/2020 FINAL  Final  Resp Panel by RT-PCR (Flu A&B, Covid) Nasopharyngeal Swab     Status: None   Collection Time: 12/15/20  2:17 PM   Specimen: Nasopharyngeal Swab; Nasopharyngeal(NP) swabs in vial transport medium  Result Value Ref Range Status   SARS Coronavirus 2 by RT PCR NEGATIVE NEGATIVE Final    Comment: (NOTE) SARS-CoV-2 target nucleic acids are NOT DETECTED.  The SARS-CoV-2 RNA is generally detectable in upper respiratory specimens during the acute phase of infection. The lowest concentration of SARS-CoV-2 viral copies this assay can detect is 138 copies/mL. A negative result does not preclude SARS-Cov-2 infection and should not be used as the sole basis for treatment or other patient management decisions. A negative result may occur with  improper specimen collection/handling, submission of specimen other than nasopharyngeal swab, presence of viral mutation(s) within the areas targeted by this assay, and inadequate number of viral copies(<138 copies/mL). A negative result must be combined with clinical observations, patient history, and epidemiological information. The expected result is Negative.  Fact Sheet for Patients:  BloggerCourse.com  Fact Sheet for Healthcare Providers:  SeriousBroker.it  This test is no t yet approved or cleared by the [place]  FDA and  has been authorized for detection and/or diagnosis of SARS-CoV-2 by FDA under an Emergency Use Authorization (EUA). This EUA will remain  in effect (meaning this test can be used) for the duration of the COVID-19 declaration under Section 564(b)(1) of the Act, 21 U.S.C.section 360bbb-3(b)(1), unless the authorization is terminated  or revoked sooner.       Influenza A by PCR NEGATIVE NEGATIVE Final   Influenza B by PCR NEGATIVE NEGATIVE Final    Comment: (NOTE) The Xpert Xpress SARS-CoV-2/FLU/RSV plus assay is intended as an aid in the diagnosis of influenza from Nasopharyngeal swab specimens and should not be used as a sole basis for treatment. Nasal washings and aspirates are unacceptable for Xpert Xpress SARS-CoV-2/FLU/RSV testing.  Fact Sheet for Patients: BloggerCourse.com  Fact Sheet for Healthcare Providers: SeriousBroker.it  This test is not yet approved or cleared by the [place] FDA and has been authorized for detection and/or diagnosis of SARS-CoV-2 by FDA under an Emergency Use Authorization (EUA). This EUA will remain in effect (meaning this test can be used) for the duration of the COVID-19 declaration under Section 564(b)(1) of the Act, 21 U.S.C. section 360bbb-3(b)(1), unless the authorization is terminated or revoked.  Performed at [facility], [address] [place], [place] [address]     RADIOLOGY STUDIES/RESULTS: CT ABDOMEN PELVIS WO CONTRAST  Result Date: 12/15/2020 CLINICAL DATA:  Status post fall. EXAM: CT CHEST, ABDOMEN AND PELVIS WITHOUT CONTRAST TECHNIQUE: Multidetector CT imaging of the chest, abdomen and pelvis was performed following the standard protocol without IV contrast. COMPARISON:  None. FINDINGS: CT CHEST FINDINGS Cardiovascular: Heart size normal. No pericardial effusion. Coronary artery atherosclerotic calcifications noted. Mediastinum/Nodes: No enlarged mediastinal,  hilar, or axillary lymph nodes. Thyroid gland, trachea, and esophagus demonstrate no significant findings. Lungs/Pleura: No pleural effusion. No airspace consolidation, atelectasis, or pneumothorax. Multiple scattered tiny lung nodules are identified throughout both lungs. These have a nonspecific appearance including: -Nodule within the left upper lobe measures 5 mm, image 39/3. -central right middle lobe lung nodule measures 5 mm, image 96/3. -Right lower lobe lung nodule measures 5 mm, image 96/3. -within the central left lower lobe there is a 5 mm lung nodule, image 99/3. Musculoskeletal: No chest wall mass or suspicious bone lesions identified. CT ABDOMEN PELVIS FINDINGS Hepatobiliary: No focal liver abnormality is seen. No gallstones, gallbladder wall thickening, or biliary dilatation. Pancreas: Unremarkable. No pancreatic ductal dilatation or surrounding inflammatory changes. Spleen: Normal in size without focal abnormality. Adrenals/Urinary Tract: Adrenal glands are unremarkable. Kidneys are normal, without renal calculi, focal lesion, or hydronephrosis. Bladder is unremarkable. Stomach/Bowel: Stomach is within normal limits. Appendix appears normal. No evidence of bowel wall thickening, distention, or inflammatory changes. Vascular/Lymphatic: Aortic atherosclerosis. No enlarged abdominal or pelvic lymph nodes. Reproductive: Prostate is unremarkable. Other: No free fluid or fluid collections. Fat containing umbilical hernia noted. There is also a small fat containing left inguinal hernia. Musculoskeletal: Spondylosis identified within the lumbar spine. This is most advanced at L5-S1. No acute or suspicious osseous findings. IMPRESSION: 1. No acute findings within the chest, abdomen or pelvis. 2. Small nonspecific pulmonary nodules are identified bilaterally. No follow-up needed if patient is low-risk (and has no known or suspected primary neoplasm). Non-contrast chest CT can be considered in 12 months if  patient is high-risk. This recommendation follows the consensus statement: Guidelines for Management of Incidental Pulmonary Nodules Detected on CT Images: From the Fleischner Society 2017; Radiology 2017; 284:228-243. 3. Aortic Atherosclerosis (ICD10-I70.0). Coronary artery calcifications. Electronically Signed   By: Signa Kell M.D.   On: 12/15/2020 14:23   CT Head Wo Contrast  Result Date: 12/15/2020 CLINICAL DATA:  Mental status change fall. EXAM: CT HEAD WITHOUT CONTRAST CT CERVICAL SPINE WITHOUT CONTRAST TECHNIQUE: Multidetector CT imaging of the head and cervical spine was performed following the standard protocol without intravenous contrast. Multiplanar CT image reconstructions of the cervical spine were also generated. COMPARISON:  CT head 03/10/15 FINDINGS: CT HEAD FINDINGS Brain: No evidence of acute infarction, hemorrhage, hydrocephalus, extra-axial collection or mass lesion/mass effect. There is mild diffuse low-attenuation within the subcortical and periventricular white matter compatible with chronic microvascular disease. Right frontal lobe encephalomalacia identified compatible with remote infarct. Vascular: No hyperdense vessel or unexpected calcification. Status post right MCA territory aneurysm clipping Skull: Changes from remote right frontal craniotomy noted. Sinuses/Orbits: There is the coastal thickening involving the sphenoid sinus and anterior ethmoid air cells as well as the frontal sinuses. Other: None CT CERVICAL SPINE FINDINGS Alignment: Normal. Skull base and vertebrae: No acute fracture. No primary bone lesion or focal pathologic process. Soft tissues and spinal canal: No prevertebral fluid or swelling. No visible canal hematoma. Disc levels:  Within normal limits. Upper chest: Negative Other: None IMPRESSION: 1. No acute intracranial abnormalities. 2. Chronic small vessel ischemic change. 3. 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 4. No  signs of cervical spine fracture or dislocation. 5. Paranasal sinus inflammation. Electronically Signed   By: Signa Kell M.D.   On: 12/15/2020 14:13   CT Chest Wo Contrast  Result Date: 12/15/2020 CLINICAL DATA:  Status post fall. EXAM: CT CHEST, ABDOMEN AND PELVIS WITHOUT CONTRAST TECHNIQUE: Multidetector CT imaging of the chest,  abdomen and pelvis was performed following the standard protocol without IV contrast. COMPARISON:  None. FINDINGS: CT CHEST FINDINGS Cardiovascular: Heart size normal. No pericardial effusion. Coronary artery atherosclerotic calcifications noted. Mediastinum/Nodes: No enlarged mediastinal, hilar, or axillary lymph nodes. Thyroid gland, trachea, and esophagus demonstrate no significant findings. Lungs/Pleura: No pleural effusion. No airspace consolidation, atelectasis, or pneumothorax. Multiple scattered tiny lung nodules are identified throughout both lungs. These have a nonspecific appearance including: -Nodule within the left upper lobe measures 5 mm, image 39/3. -central right middle lobe lung nodule measures 5 mm, image 96/3. -Right lower lobe lung nodule measures 5 mm, image 96/3. -within the central left lower lobe there is a 5 mm lung nodule, image 99/3. Musculoskeletal: No chest wall mass or suspicious bone lesions identified. CT ABDOMEN PELVIS FINDINGS Hepatobiliary: No focal liver abnormality is seen. No gallstones, gallbladder wall thickening, or biliary dilatation. Pancreas: Unremarkable. No pancreatic ductal dilatation or surrounding inflammatory changes. Spleen: Normal in size without focal abnormality. Adrenals/Urinary Tract: Adrenal glands are unremarkable. Kidneys are normal, without renal calculi, focal lesion, or hydronephrosis. Bladder is unremarkable. Stomach/Bowel: Stomach is within normal limits. Appendix appears normal. No evidence of bowel wall thickening, distention, or inflammatory changes. Vascular/Lymphatic: Aortic atherosclerosis. No enlarged abdominal  or pelvic lymph nodes. Reproductive: Prostate is unremarkable. Other: No free fluid or fluid collections. Fat containing umbilical hernia noted. There is also a small fat containing left inguinal hernia. Musculoskeletal: Spondylosis identified within the lumbar spine. This is most advanced at L5-S1. No acute or suspicious osseous findings. IMPRESSION: 1. No acute findings within the chest, abdomen or pelvis. 2. Small nonspecific pulmonary nodules are identified bilaterally. No follow-up needed if patient is low-risk (and has no known or suspected primary neoplasm). Non-contrast chest CT can be considered in 12 months if patient is high-risk. This recommendation follows the consensus statement: Guidelines for Management of Incidental Pulmonary Nodules Detected on CT Images: From the Fleischner Society 2017; Radiology 2017; 284:228-243. 3. Aortic Atherosclerosis (ICD10-I70.0). Coronary artery calcifications. Electronically Signed   By: Signa Kell M.D.   On: 12/15/2020 14:23   CT Cervical Spine Wo Contrast  Result Date: 12/15/2020 CLINICAL DATA:  Mental status change fall. EXAM: CT HEAD WITHOUT CONTRAST CT CERVICAL SPINE WITHOUT CONTRAST TECHNIQUE: Multidetector CT imaging of the head and cervical spine was performed following the standard protocol without intravenous contrast. Multiplanar CT image reconstructions of the cervical spine were also generated. COMPARISON:  CT head 03/10/15 FINDINGS: CT HEAD FINDINGS Brain: No evidence of acute infarction, hemorrhage, hydrocephalus, extra-axial collection or mass lesion/mass effect. There is mild diffuse low-attenuation within the subcortical and periventricular white matter compatible with chronic microvascular disease. Right frontal lobe encephalomalacia identified compatible with remote infarct. Vascular: No hyperdense vessel or unexpected calcification. Status post right MCA territory aneurysm clipping Skull: Changes from remote right frontal craniotomy noted.  Sinuses/Orbits: There is the coastal thickening involving the sphenoid sinus and anterior ethmoid air cells as well as the frontal sinuses. Other: None CT CERVICAL SPINE FINDINGS Alignment: Normal. Skull base and vertebrae: No acute fracture. No primary bone lesion or focal pathologic process. Soft tissues and spinal canal: No prevertebral fluid or swelling. No visible canal hematoma. Disc levels:  Within normal limits. Upper chest: Negative Other: None IMPRESSION: 1. No acute intracranial abnormalities. 2. Chronic small vessel ischemic change. 3. 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 4. No signs of cervical spine fracture or dislocation. 5. Paranasal sinus inflammation. Electronically Signed   By: Veronda Prude.D.  On: 12/15/2020 14:13   DG Pelvis Portable  Result Date: 12/15/2020 CLINICAL DATA:  Level 1 trauma, confusion, fall EXAM: PORTABLE PELVIS 1-2 VIEWS COMPARISON:  Portable exam 1328 hours without priors for comparison FINDINGS: Osseous mineralization normal for technique. Hip and SI joint spaces symmetric. No fracture, dislocation, or bone destruction. IMPRESSION: No acute abnormalities. Electronically Signed   By: Ulyses SouthwardMark  Boles M.D.   On: 12/15/2020 13:56   DG Chest Port 1 View  Result Date: 12/15/2020 CLINICAL DATA:  Acute respiratory distress. EXAM: PORTABLE CHEST 1 VIEW COMPARISON:  Earlier film, same date. FINDINGS: Stable cardiac enlargement. No acute pulmonary findings. No pleural effusions pulmonary lesions. The bony thorax is intact. IMPRESSION: Cardiac enlargement but no acute pulmonary findings. Electronically Signed   By: Rudie MeyerP.  Gallerani M.D.   On: 12/15/2020 21:49   DG Chest Port 1 View  Result Date: 12/15/2020 CLINICAL DATA:  Level 1 trauma, confusion, fall, RIGHT-side chest pain EXAM: PORTABLE CHEST 1 VIEW COMPARISON:  Portable exam 1326 hours compared to 03/03/2015 FINDINGS: Minimal enlargement of cardiac silhouette. Mediastinal contours and  pulmonary vascularity normal. Lungs clear. No pulmonary infiltrate, pleural effusion or pneumothorax. No acute osseous findings. IMPRESSION: Minimal enlargement of cardiac silhouette. No acute abnormalities. Electronically Signed   By: Ulyses SouthwardMark  Boles M.D.   On: 12/15/2020 13:55     LOS: 2 days   Susa RaringPrashant  Paone, MD  [facility]  12/17/2020, 8:28 AM

## 2020-12-17 NOTE — Progress Notes (Signed)
Subjective:  Denies SSCP, palpitations or Dyspnea  Objective:  Vitals:   12/16/20 1901 12/16/20 2330 12/17/20 0336 12/17/20 0756  BP: 113/87 118/84 125/87 (!) 125/98  Pulse: (!) 108 (!) 107 (!) 105 71  Resp: (!) 22 (!) 21 (!) 21   Temp: 99 F (37.2 C) 98.4 F (36.9 C) 98.6 F (37 C)   TempSrc: Axillary Oral Oral   SpO2: 98% 98% 99%   Weight:   131.8 kg   Height:        Intake/Output from previous day:  Intake/Output Summary (Last 24 hours) at 12/17/2020 0816 Last data filed at 12/17/2020 0500 Gross per 24 hour  Intake 560.06 ml  Output 1000 ml  Net -439.94 ml    Physical Exam: Affect appropriate Healthy:  appears stated age HEENT: normal Neck supple with no adenopathy JVP normal no bruits no thyromegaly Lungs clear with no wheezing and good diaphragmatic motion Heart:  S1/S2 no murmur, no rub, gallop or click PMI normal Abdomen: benighn, BS positve, no tenderness, no AAA no bruit.  No HSM or HJR Distal pulses intact with no bruits No edema Neuro non-focal Skin warm and dry No muscular weakness   Lab Results: Basic Metabolic Panel: Recent Labs    12/15/20 1552 12/16/20 0136 12/17/20 0112  NA  --  147* 140  K  --  4.6 3.9  CL  --  115* 107  CO2  --  19* 26  GLUCOSE  --  90 98  BUN  --  18 17  CREATININE  --  1.75* 1.66*  CALCIUM  --  8.9 8.3*  MG 2.2  --   --   PHOS 3.3  --   --    Liver Function Tests: Recent Labs    12/16/20 0136 12/17/20 0112  AST 57* 35  ALT 24 21  ALKPHOS 27* 28*  BILITOT 1.1 1.3*  PROT 6.3* 5.7*  ALBUMIN 3.4* 3.1*   No results for input(s): LIPASE, AMYLASE in the last 72 hours. CBC: Recent Labs    12/15/20 1334 12/16/20 0136 12/17/20 0112  WBC 6.6 7.2 8.6  NEUTROABS 3.8  --   --   HGB 14.0 15.4 14.3  HCT 42.9 47.5 42.9  MCV 111.1* 109.7* 108.1*  PLT 232 227 196   Cardiac Enzymes: Recent Labs    12/15/20 1552  CKTOTAL 377   BNP: Invalid input(s): POCBNP D-Dimer: Recent Labs    12/15/20 1334   DDIMER 0.61*   Thyroid Function Tests: Recent Labs    12/15/20 1557  TSH 2.377   Anemia Panel: No results for input(s): VITAMINB12, FOLATE, FERRITIN, TIBC, IRON, RETICCTPCT in the last 72 hours.  Imaging: CT ABDOMEN PELVIS WO CONTRAST  Result Date: 12/15/2020 CLINICAL DATA:  Status post fall. EXAM: CT CHEST, ABDOMEN AND PELVIS WITHOUT CONTRAST TECHNIQUE: Multidetector CT imaging of the chest, abdomen and pelvis was performed following the standard protocol without IV contrast. COMPARISON:  None. FINDINGS: CT CHEST FINDINGS Cardiovascular: Heart size normal. No pericardial effusion. Coronary artery atherosclerotic calcifications noted. Mediastinum/Nodes: No enlarged mediastinal, hilar, or axillary lymph nodes. Thyroid gland, trachea, and esophagus demonstrate no significant findings. Lungs/Pleura: No pleural effusion. No airspace consolidation, atelectasis, or pneumothorax. Multiple scattered tiny lung nodules are identified throughout both lungs. These have a nonspecific appearance including: -Nodule within the left upper lobe measures 5 mm, image 39/3. -central right middle lobe lung nodule measures 5 mm, image 96/3. -Right lower lobe lung nodule measures 5 mm, image 96/3. -within the  central left lower lobe there is a 5 mm lung nodule, image 99/3. Musculoskeletal: No chest wall mass or suspicious bone lesions identified. CT ABDOMEN PELVIS FINDINGS Hepatobiliary: No focal liver abnormality is seen. No gallstones, gallbladder wall thickening, or biliary dilatation. Pancreas: Unremarkable. No pancreatic ductal dilatation or surrounding inflammatory changes. Spleen: Normal in size without focal abnormality. Adrenals/Urinary Tract: Adrenal glands are unremarkable. Kidneys are normal, without renal calculi, focal lesion, or hydronephrosis. Bladder is unremarkable. Stomach/Bowel: Stomach is within normal limits. Appendix appears normal. No evidence of bowel wall thickening, distention, or inflammatory  changes. Vascular/Lymphatic: Aortic atherosclerosis. No enlarged abdominal or pelvic lymph nodes. Reproductive: Prostate is unremarkable. Other: No free fluid or fluid collections. Fat containing umbilical hernia noted. There is also a small fat containing left inguinal hernia. Musculoskeletal: Spondylosis identified within the lumbar spine. This is most advanced at L5-S1. No acute or suspicious osseous findings. IMPRESSION: 1. No acute findings within the chest, abdomen or pelvis. 2. Small nonspecific pulmonary nodules are identified bilaterally. No follow-up needed if patient is low-risk (and has no known or suspected primary neoplasm). Non-contrast chest CT can be considered in 12 months if patient is high-risk. This recommendation follows the consensus statement: Guidelines for Management of Incidental Pulmonary Nodules Detected on CT Images: From the Fleischner Society 2017; Radiology 2017; [id number]. 3. Aortic Atherosclerosis (ICD10-I70.0). Coronary artery calcifications. Electronically Signed   By: Signa Kell M.D.   On: 12/15/2020 14:23   CT Head Wo Contrast  Result Date: 12/15/2020 CLINICAL DATA:  Mental status change fall. EXAM: CT HEAD WITHOUT CONTRAST CT CERVICAL SPINE WITHOUT CONTRAST TECHNIQUE: Multidetector CT imaging of the head and cervical spine was performed following the standard protocol without intravenous contrast. Multiplanar CT image reconstructions of the cervical spine were also generated. COMPARISON:  CT head 03/10/15 FINDINGS: CT HEAD FINDINGS Brain: No evidence of acute infarction, hemorrhage, hydrocephalus, extra-axial collection or mass lesion/mass effect. There is mild diffuse low-attenuation within the subcortical and periventricular white matter compatible with chronic microvascular disease. Right frontal lobe encephalomalacia identified compatible with remote infarct. Vascular: No hyperdense vessel or unexpected calcification. Status post right MCA territory aneurysm  clipping Skull: Changes from remote right frontal craniotomy noted. Sinuses/Orbits: There is the coastal thickening involving the sphenoid sinus and anterior ethmoid air cells as well as the frontal sinuses. Other: None CT CERVICAL SPINE FINDINGS Alignment: Normal. Skull base and vertebrae: No acute fracture. No primary bone lesion or focal pathologic process. Soft tissues and spinal canal: No prevertebral fluid or swelling. No visible canal hematoma. Disc levels:  Within normal limits. Upper chest: Negative Other: None IMPRESSION: 1. No acute intracranial abnormalities. 2. Chronic small vessel ischemic change. 3. 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 4. No signs of cervical spine fracture or dislocation. 5. Paranasal sinus inflammation. Electronically Signed   By: Signa Kell M.D.   On: 12/15/2020 14:13   CT Chest Wo Contrast  Result Date: 12/15/2020 CLINICAL DATA:  Status post fall. EXAM: CT CHEST, ABDOMEN AND PELVIS WITHOUT CONTRAST TECHNIQUE: Multidetector CT imaging of the chest, abdomen and pelvis was performed following the standard protocol without IV contrast. COMPARISON:  None. FINDINGS: CT CHEST FINDINGS Cardiovascular: Heart size normal. No pericardial effusion. Coronary artery atherosclerotic calcifications noted. Mediastinum/Nodes: No enlarged mediastinal, hilar, or axillary lymph nodes. Thyroid gland, trachea, and esophagus demonstrate no significant findings. Lungs/Pleura: No pleural effusion. No airspace consolidation, atelectasis, or pneumothorax. Multiple scattered tiny lung nodules are identified throughout both lungs. These have a nonspecific appearance  including: -Nodule within the left upper lobe measures 5 mm, image 39/3. -central right middle lobe lung nodule measures 5 mm, image 96/3. -Right lower lobe lung nodule measures 5 mm, image 96/3. -within the central left lower lobe there is a 5 mm lung nodule, image 99/3. Musculoskeletal: No chest  wall mass or suspicious bone lesions identified. CT ABDOMEN PELVIS FINDINGS Hepatobiliary: No focal liver abnormality is seen. No gallstones, gallbladder wall thickening, or biliary dilatation. Pancreas: Unremarkable. No pancreatic ductal dilatation or surrounding inflammatory changes. Spleen: Normal in size without focal abnormality. Adrenals/Urinary Tract: Adrenal glands are unremarkable. Kidneys are normal, without renal calculi, focal lesion, or hydronephrosis. Bladder is unremarkable. Stomach/Bowel: Stomach is within normal limits. Appendix appears normal. No evidence of bowel wall thickening, distention, or inflammatory changes. Vascular/Lymphatic: Aortic atherosclerosis. No enlarged abdominal or pelvic lymph nodes. Reproductive: Prostate is unremarkable. Other: No free fluid or fluid collections. Fat containing umbilical hernia noted. There is also a small fat containing left inguinal hernia. Musculoskeletal: Spondylosis identified within the lumbar spine. This is most advanced at L5-S1. No acute or suspicious osseous findings. IMPRESSION: 1. No acute findings within the chest, abdomen or pelvis. 2. Small nonspecific pulmonary nodules are identified bilaterally. No follow-up needed if patient is low-risk (and has no known or suspected primary neoplasm). Non-contrast chest CT can be considered in 12 months if patient is high-risk. This recommendation follows the consensus statement: Guidelines for Management of Incidental Pulmonary Nodules Detected on CT Images: From the Fleischner Society 2017; Radiology 2017; [id number]. 3. Aortic Atherosclerosis (ICD10-I70.0). Coronary artery calcifications. Electronically Signed   By: Signa Kell M.D.   On: 12/15/2020 14:23   CT Cervical Spine Wo Contrast  Result Date: 12/15/2020 CLINICAL DATA:  Mental status change fall. EXAM: CT HEAD WITHOUT CONTRAST CT CERVICAL SPINE WITHOUT CONTRAST TECHNIQUE: Multidetector CT imaging of the head and cervical spine was performed  following the standard protocol without intravenous contrast. Multiplanar CT image reconstructions of the cervical spine were also generated. COMPARISON:  CT head 03/10/15 FINDINGS: CT HEAD FINDINGS Brain: No evidence of acute infarction, hemorrhage, hydrocephalus, extra-axial collection or mass lesion/mass effect. There is mild diffuse low-attenuation within the subcortical and periventricular white matter compatible with chronic microvascular disease. Right frontal lobe encephalomalacia identified compatible with remote infarct. Vascular: No hyperdense vessel or unexpected calcification. Status post right MCA territory aneurysm clipping Skull: Changes from remote right frontal craniotomy noted. Sinuses/Orbits: There is the coastal thickening involving the sphenoid sinus and anterior ethmoid air cells as well as the frontal sinuses. Other: None CT CERVICAL SPINE FINDINGS Alignment: Normal. Skull base and vertebrae: No acute fracture. No primary bone lesion or focal pathologic process. Soft tissues and spinal canal: No prevertebral fluid or swelling. No visible canal hematoma. Disc levels:  Within normal limits. Upper chest: Negative Other: None IMPRESSION: 1. No acute intracranial abnormalities. 2. Chronic small vessel ischemic change. 3. 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 4. No signs of cervical spine fracture or dislocation. 5. Paranasal sinus inflammation. Electronically Signed   By: Signa Kell M.D.   On: 12/15/2020 14:13   DG Pelvis Portable  Result Date: 12/15/2020 CLINICAL DATA:  Level 1 trauma, confusion, fall EXAM: PORTABLE PELVIS 1-2 VIEWS COMPARISON:  Portable exam 1328 hours without priors for comparison FINDINGS: Osseous mineralization normal for technique. Hip and SI joint spaces symmetric. No fracture, dislocation, or bone destruction. IMPRESSION: No acute abnormalities. Electronically Signed   By: Ulyses Southward M.D.   On: 12/15/2020 13:56  DG Chest  Port 1 View  Result Date: 12/15/2020 CLINICAL DATA:  Acute respiratory distress. EXAM: PORTABLE CHEST 1 VIEW COMPARISON:  Earlier film, same date. FINDINGS: Stable cardiac enlargement. No acute pulmonary findings. No pleural effusions pulmonary lesions. The bony thorax is intact. IMPRESSION: Cardiac enlargement but no acute pulmonary findings. Electronically Signed   By: Rudie Meyer M.D.   On: 12/15/2020 21:49   DG Chest Port 1 View  Result Date: 12/15/2020 CLINICAL DATA:  Level 1 trauma, confusion, fall, RIGHT-side chest pain EXAM: PORTABLE CHEST 1 VIEW COMPARISON:  Portable exam 1326 hours compared to 03/03/2015 FINDINGS: Minimal enlargement of cardiac silhouette. Mediastinal contours and pulmonary vascularity normal. Lungs clear. No pulmonary infiltrate, pleural effusion or pneumothorax. No acute osseous findings. IMPRESSION: Minimal enlargement of cardiac silhouette. No acute abnormalities. Electronically Signed   By: Ulyses Southward M.D.   On: 12/15/2020 13:55    Cardiac Studies:  ECG: afib rate 135 tall R wave in V1   Telemetry:afib rates 100-120 bpm  Echo: pending   Medications:   . carvedilol  3.125 mg Oral BID WC  . chlordiazePOXIDE  10 mg Oral TID  . enoxaparin (LOVENOX) injection  60 mg Subcutaneous Q24H  . fenofibrate  160 mg Oral Daily  . folic acid  1 mg Oral Daily  . icosapent Ethyl  2 g Oral BID  . LORazepam  0-4 mg Intravenous Q6H   Followed by  . LORazepam  0-4 mg Intravenous Q12H  . multivitamin with minerals  1 tablet Oral Daily  . rosuvastatin  40 mg Oral Daily  . thiamine  100 mg Oral Daily   Or  . thiamine  100 mg Intravenous Daily     . amiodarone 30 mg/hr (12/16/20 2144)    Assessment/Plan:   1. Afib:  In setting of ETOH abuse ? Holiday heart syndrome Not a candidate for anticoagulation with history of SAH and falls Increase coreg for rate control  TTE pending for today  2. HLD:  On statin   3. ETOH:  On librium and folic acid   Daniel Butler 8/33/8250, 8:16 AM

## 2020-12-17 NOTE — Progress Notes (Signed)
12/17/2020 Patient IV that was running Amiodarone in right AC was leaking and infiltrated. Right arm was warm to touch and patient report arm was sore. Dr Thedore Mins was notified at [phone]. Verbal order was given by MD to let pharmacy and cardiology to be aware of the incident. Fredonia Highland, (pharmacist) and Nada Boozer, (NP) for cardiololgy was notified. Patient arm was elevated and heating pad was apply. IV team consult was order for a new IV to be placed. [facility] RN.

## 2020-12-17 NOTE — Progress Notes (Signed)
  Echocardiogram 2D Echocardiogram has been performed.  Delcie Roch 12/17/2020, 4:08 PM

## 2020-12-17 NOTE — Plan of Care (Signed)
  Problem: Activity: Goal: Capacity to carry out activities will improve Outcome: Progressing   Problem: Education: Goal: Knowledge of General Education information will improve Description: Including pain rating scale, medication(s)/side effects and non-pharmacologic comfort measures Outcome: Progressing   Problem: Health Behavior/Discharge Planning: Goal: Ability to manage health-related needs will improve Outcome: Progressing   Problem: Clinical Measurements: Goal: Ability to maintain clinical measurements within normal limits will improve Outcome: Progressing Goal: Will remain free from infection Outcome: Progressing Goal: Diagnostic test results will improve Outcome: Progressing Goal: Respiratory complications will improve Outcome: Progressing Goal: Cardiovascular complication will be avoided Outcome: Progressing   Problem: Activity: Goal: Risk for activity intolerance will decrease Outcome: Progressing   Problem: Nutrition: Goal: Adequate nutrition will be maintained Outcome: Progressing   Problem: Coping: Goal: Level of anxiety will decrease Outcome: Progressing   Problem: Elimination: Goal: Will not experience complications related to bowel motility Outcome: Progressing Goal: Will not experience complications related to urinary retention Outcome: Progressing   Problem: Pain Managment: Goal: General experience of comfort will improve Outcome: Progressing   Problem: Safety: Goal: Ability to remain free from injury will improve Outcome: Progressing   Problem: Skin Integrity: Goal: Risk for impaired skin integrity will decrease Outcome: Progressing   Problem: Education: Goal: Ability to demonstrate management of disease process will improve Outcome: Progressing Goal: Ability to verbalize understanding of medication therapies will improve Outcome: Progressing Goal: Individualized Educational Video(s) Outcome: Progressing   Problem: Cardiac: Goal:  Ability to achieve and maintain adequate cardiopulmonary perfusion will improve Outcome: Progressing   

## 2020-12-18 ENCOUNTER — Ambulatory Visit: Payer: Self-pay | Admitting: Family

## 2020-12-18 DIAGNOSIS — N179 Acute kidney failure, unspecified: Secondary | ICD-10-CM

## 2020-12-18 DIAGNOSIS — I5041 Acute combined systolic (congestive) and diastolic (congestive) heart failure: Secondary | ICD-10-CM

## 2020-12-18 LAB — CBC WITH DIFFERENTIAL/PLATELET
Abs Immature Granulocytes: 0.05 10*3/uL (ref 0.00–0.07)
Basophils Absolute: 0 10*3/uL (ref 0.0–0.1)
Basophils Relative: 0 %
Eosinophils Absolute: 0.1 10*3/uL (ref 0.0–0.5)
Eosinophils Relative: 1 %
HCT: 42.4 % (ref 39.0–52.0)
Hemoglobin: 14.3 g/dL (ref 13.0–17.0)
Immature Granulocytes: 1 %
Lymphocytes Relative: 16 %
Lymphs Abs: 1.6 10*3/uL (ref 0.7–4.0)
MCH: 36 pg — ABNORMAL HIGH (ref 26.0–34.0)
MCHC: 33.7 g/dL (ref 30.0–36.0)
MCV: 106.8 fL — ABNORMAL HIGH (ref 80.0–100.0)
Monocytes Absolute: 0.7 10*3/uL (ref 0.1–1.0)
Monocytes Relative: 7 %
Neutro Abs: 7.6 10*3/uL (ref 1.7–7.7)
Neutrophils Relative %: 75 %
Platelets: 166 10*3/uL (ref 150–400)
RBC: 3.97 MIL/uL — ABNORMAL LOW (ref 4.22–5.81)
RDW: 14.6 % (ref 11.5–15.5)
WBC: 10.1 10*3/uL (ref 4.0–10.5)
nRBC: 0 % (ref 0.0–0.2)

## 2020-12-18 LAB — COMPREHENSIVE METABOLIC PANEL
ALT: 16 U/L (ref 0–44)
AST: 26 U/L (ref 15–41)
Albumin: 2.7 g/dL — ABNORMAL LOW (ref 3.5–5.0)
Alkaline Phosphatase: 24 U/L — ABNORMAL LOW (ref 38–126)
Anion gap: 10 (ref 5–15)
BUN: 13 mg/dL (ref 6–20)
CO2: 25 mmol/L (ref 22–32)
Calcium: 8.4 mg/dL — ABNORMAL LOW (ref 8.9–10.3)
Chloride: 102 mmol/L (ref 98–111)
Creatinine, Ser: 1.5 mg/dL — ABNORMAL HIGH (ref 0.61–1.24)
GFR, Estimated: 55 mL/min — ABNORMAL LOW (ref >60)
Glucose, Bld: 107 mg/dL — ABNORMAL HIGH (ref 70–99)
Potassium: 3.7 mmol/L (ref 3.5–5.1)
Sodium: 137 mmol/L (ref 135–145)
Total Bilirubin: 1 mg/dL (ref 0.3–1.2)
Total Protein: 5.4 g/dL — ABNORMAL LOW (ref 6.5–8.1)

## 2020-12-18 LAB — BRAIN NATRIURETIC PEPTIDE: B Natriuretic Peptide: 277.6 pg/mL — ABNORMAL HIGH (ref 0.0–100.0)

## 2020-12-18 LAB — MAGNESIUM: Magnesium: 1.7 mg/dL (ref 1.7–2.4)

## 2020-12-18 MED ORDER — ALLOPURINOL 100 MG PO TABS
100.0000 mg | ORAL_TABLET | Freq: Every day | ORAL | Status: DC
  Administered 2020-12-18 – 2020-12-23 (×6): 100 mg via ORAL
  Filled 2020-12-18 (×6): qty 1

## 2020-12-18 MED ORDER — METOPROLOL TARTRATE 50 MG PO TABS
50.0000 mg | ORAL_TABLET | Freq: Two times a day (BID) | ORAL | Status: DC
  Administered 2020-12-18 – 2020-12-19 (×4): 50 mg via ORAL
  Filled 2020-12-18 (×4): qty 1

## 2020-12-18 MED ORDER — ASPIRIN 81 MG PO CHEW
81.0000 mg | CHEWABLE_TABLET | ORAL | Status: AC
  Administered 2020-12-19: 81 mg via ORAL
  Filled 2020-12-18: qty 1

## 2020-12-18 MED ORDER — SODIUM CHLORIDE 0.9 % IV SOLN: 250.0000 mL | INTRAVENOUS | Status: DC | PRN

## 2020-12-18 MED ORDER — SODIUM CHLORIDE 0.9% FLUSH
3.0000 mL | Freq: Two times a day (BID) | INTRAVENOUS | Status: DC
  Administered 2020-12-18 (×2): 3 mL via INTRAVENOUS

## 2020-12-18 MED ORDER — METOPROLOL TARTRATE 25 MG PO TABS: 25.0000 mg | ORAL_TABLET | Freq: Two times a day (BID) | ORAL | Status: DC

## 2020-12-18 MED ORDER — FUROSEMIDE 20 MG PO TABS
20.0000 mg | ORAL_TABLET | Freq: Once | ORAL | Status: AC
  Administered 2020-12-18: 20 mg via ORAL
  Filled 2020-12-18: qty 1

## 2020-12-18 MED ORDER — SODIUM CHLORIDE 0.9 % IV SOLN: INTRAVENOUS | Status: DC

## 2020-12-18 MED ORDER — SODIUM CHLORIDE 0.9% FLUSH: 3.0000 mL | INTRAVENOUS | Status: DC | PRN

## 2020-12-18 MED ORDER — HYDROCODONE-ACETAMINOPHEN 5-325 MG PO TABS: 1.0000 | ORAL_TABLET | Freq: Four times a day (QID) | ORAL | Status: DC | PRN

## 2020-12-18 MED ORDER — POTASSIUM CHLORIDE CRYS ER 20 MEQ PO TBCR
40.0000 meq | EXTENDED_RELEASE_TABLET | Freq: Once | ORAL | Status: AC
  Administered 2020-12-18: 40 meq via ORAL
  Filled 2020-12-18: qty 2

## 2020-12-18 NOTE — Progress Notes (Signed)
PROGRESS NOTE        PATIENT DETAILS Name: Daniel Butler Age: 55 y.o. Sex: male Date of Birth: [birth year] Admit Date: 12/15/2020 Admitting Physician Clydie Braun, MD [id number], Allyne Gee, FNP  Brief Narrative: Patient is a 55 y.o. male significant past medical history of alcohol abuse, SAH-s/p craniotomy with clipping in 2016, HTN, HLD, depression/anxiety-who presented to the hospital in the setting of a mechanical fall due to alcohol intoxication.  In the ED-patient was evaluated by trauma services-and found to have no major injuries.  However he was found to have A. fib with RVR, significant lower extremity edema, AKI-started on amiodarone infusion and admitted to the hospitalist service.  See below for further details.  Significant events: 4/8>> fall-alcohol intoxication-brought to ED-found to be in RVR/AKI/lower extremity edema.  Admit to [facility]  Significant studies: 4/8>> CT head: No intracranial abnormalities 4/8>> CT C-spine: No signs of cervical fracture or dislocation 4/8>> CT chest: No acute findings in the chest-nonspecific pulmonary nodules bilaterally 4/8>> CT abdomen/pelvis: No acute injury/acute findings 4/8>> EtOH level: 237 4/8>> acute hepatitis serology: Negative 4/8>> TSH: 2.37 4/10> TTE - 1. Left ventricular ejection fraction, by estimation, is 30 to 35%. The left ventricle has moderately decreased function. The left ventricle demonstrates global hypokinesis. Left ventricular diastolic function could not be evaluated.  2. Right ventricular systolic function is normal. The right ventricular size is normal. There is normal pulmonary artery systolic pressure.  3. Right atrial size was mildly dilated.  4. The mitral valve is normal in structure. No evidence of mitral valve regurgitation.  5. The aortic valve is normal in structure. Aortic valve regurgitation is not visualized  Antimicrobial therapy: None  Microbiology data: 4/8>>  blood culture: Pending 4/8>> urine culture: Pending  Procedures : None  Consults: Trauma, cardiology  DVT Prophylaxis : Place TED hose Start: 12/17/20 0831 SCDs Start: 12/15/20 1557  Prophylactic Lovenox   Subjective:  Patient in bed, appears comfortable, denies any headache, no fever, no chest pain or pressure, no shortness of breath , no abdominal pain. No new focal weakness.   Assessment/Plan:  Mechanical fall: Likely due to alcohol intoxication and use of Xanax-await  PT/OT eval.  Imaging negative for fractures/acute injuries.  Per spouse-has had multiple falls over the past 2 weeks.  A. fib with RVR: Remains in RVR-continue amiodarone infusion-await echocardiogram.  TSH within normal limits.  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  [place] vas 2 score of at least 1, not a good long-term candidate for anticoagulation given history of alcohol use/falls-and prior SAH.  Echo noted EF 35%, cardiology following.  Rate in better control with combination of amiodarone and low-dose beta-blocker.  AKI: Likely hemodynamically mediated-patient had transient hypotension in the emergency room-furthermore he also appears to be on losartan.  UA negative for proteinuria-CT abdomen negative for hydronephrosis-creatinine improving with supportive care.  Newly diagnosed chronic systolic heart failure EF 30% with hypokinesis.  Will let cardiology opine, currently on beta-blocker, ACE/ARB if renal function improves and blood pressure can tolerate it.  Currently appears to have mild fluid overload, gentle diuresis as tolerated    Lower extremity edema: Probably related to possible underlying CHF-echo pending.  UA negative for proteinuria-although has history of alcohol use-liver does not appear nodular on CT.  Furthermore-INR normal and albumin levels only borderline low.  Gentle diuretics as tolerated  by blood pressure and renal function, TED stockings ordered.  Acute toxic metabolic  encephalopathy: Due to alcohol intoxication-AKI-mentation has improved.  Answering most of my questions appropriately.  ?  Dysphagia: Choking spell this a.m.-keep n.p.o.-await SLP eval.  Lactic acidosis: Due to hypoperfusion in the setting of hypotension-no indication of sepsis.  Mild transaminitis: Probably mild alcoholic hepatitis-acute hepatitis serology negative.  No signs of cirrhosis on imaging.  HLD: Remains on fenofibrate, Vascepa, Crestor-watch LFTs closely.  HTN: BP stable-losartan on hold due to AKI.  Anxiety: On benzodiazepine per CIWA protocol.  Per spouse-takes Xanax multiple times a day-we will go ahead and place him on scheduled benzodiazepines-we will use Librium-and taper slowly over the next few days.  Alcohol abuse: Has somewhat of a flushed appearance-but no tremors-is awake/alert-on Ativan per CIWA protocol.  Remains at risk for progression to DTs.  Last drink was 4/8.  Drinks ATLEAST 6 pack of beer daily + 2-3 shots of liquor+daily Xanax use, has been counseled to quit alcohol.  Bilateral pulmonary nodules: Per radiology-repeat CT chest in 12 months  Debility/deconditioning/multiple falls: Await PT/OT eval  Morbid Obesity: Estimated body mass index is 37.31 kg/m as calculated from the following:   Height as of this encounter: 6\' 2"  (1.88 m).   Weight as of this encounter: 131.8 kg.    Diet: Diet Order            DIET SOFT Room service appropriate? Yes; Fluid consistency: Thin  Diet effective now                  Code Status:  Full code   Family Communication:  Spouse-[name]- [phone]- over the phone 12/17/20  Disposition Plan:  Status is: Inpatient  Remains inpatient appropriate because:Inpatient level of care appropriate due to severity of illness   Dispo: The patient is from: Home              Anticipated d/c is to: Home              Patient currently is not medically stable to d/c.   Difficult to place patient No    Barriers to  Discharge:   Afib RVR-on amiodarone infusion  Antimicrobial agents:  Anti-infectives (From admission, onward)   Start     Dose/Rate Route Frequency Ordered Stop   12/15/20 1400  vancomycin (VANCOREADY) IVPB 2000 mg/400 mL        2,000 mg 200 mL/hr over 120 Minutes Intravenous  Once 12/15/20 1358 12/15/20 1621   12/15/20 1400  ceFEPIme (MAXIPIME) 2 g in sodium chloride 0.9 % 100 mL IVPB        2 g 200 mL/hr over 30 Minutes Intravenous  Once 12/15/20 1358 12/15/20 1447       Time spent: 35 minutes-Greater than 50% of this time was spent in counseling, explanation of diagnosis, planning of further management, and coordination of care.  MEDICATIONS: Scheduled Meds: . chlordiazePOXIDE  10 mg Oral TID  . enoxaparin (LOVENOX) injection  60 mg Subcutaneous Q24H  . fenofibrate  160 mg Oral Daily  . folic acid  1 mg Oral Daily  . furosemide  20 mg Oral Once  . icosapent Ethyl  2 g Oral BID  . LORazepam  0-4 mg Intravenous Q12H  . metoprolol tartrate  25 mg Oral BID  . multivitamin with minerals  1 tablet Oral Daily  . potassium chloride  40 mEq Oral Once  . rosuvastatin  40 mg Oral Daily  . thiamine  100 mg  Intravenous Daily   Continuous Infusions: . amiodarone 30 mg/hr (12/18/20 0744)   PRN Meds:.albuterol, HYDROcodone-acetaminophen, levalbuterol, LORazepam **OR** LORazepam, metoprolol tartrate   PHYSICAL EXAM: Vital signs: Vitals:   12/17/20 2333 12/18/20 0400 12/18/20 0517 12/18/20 0746  BP: 108/60  101/67 107/73  Pulse: 83 60 62 91  Resp: 19  18 20   Temp: 98.5 F (36.9 C)  98.6 F (37 C) 97.8 F (36.6 C)  TempSrc: Oral  Oral Oral  SpO2: 97%  97% 95%  Weight:      Height:       Filed Weights   12/15/20 1307 12/16/20 0322 12/17/20 0336  Weight: 120.2 kg 128.2 kg 131.8 kg   Body mass index is 37.31 kg/m.   Gen Exam:  Awake Alert, No new F.N deficits, Normal affect [place].AT,PERRAL Supple Neck,No JVD, No cervical lymphadenopathy appriciated.  Symmetrical Chest  wall movement, Good air movement bilaterally, CTAB RRR,No Gallops, Rubs or new Murmurs, No Parasternal Heave +ve B.Sounds, Abd Soft, No tenderness, No organomegaly appriciated, No rebound - guarding or rigidity. No Cyanosis, 1+ leg edema   I have personally reviewed following labs and imaging studies  LABORATORY DATA: CBC: Recent Labs  Lab 12/15/20 1334 12/16/20 0136 12/17/20 0112 12/18/20 0147  WBC 6.6 7.2 8.6 10.1  NEUTROABS 3.8  --   --  7.6  HGB 14.0 15.4 14.3 14.3  HCT 42.9 47.5 42.9 42.4  MCV 111.1* 109.7* 108.1* 106.8*  PLT 232 227 196 166    Basic Metabolic Panel: Recent Labs  Lab 12/15/20 1334 12/15/20 1552 12/16/20 0136 12/17/20 0112 12/18/20 0147  NA 142  --  147* 140 137  K 3.7  --  4.6 3.9 3.7  CL 110  --  115* 107 102  CO2 24  --  19* 26 25  GLUCOSE 108*  --  90 98 107*  BUN 23*  --  18 17 13   CREATININE 2.08*  --  1.75* 1.66* 1.50*  CALCIUM 8.3*  --  8.9 8.3* 8.4*  MG  --  2.2  --   --  1.7  PHOS  --  3.3  --   --   --     GFR: Estimated Creatinine Clearance: 80.3 mL/min (A) (by C-G formula based on SCr of 1.5 mg/dL (H)).  Liver Function Tests: Recent Labs  Lab 12/15/20 1334 12/16/20 0136 12/17/20 0112 12/18/20 0147  AST 49* 57* 35 26  ALT 23 24 21 16   ALKPHOS 26* 27* 28* 24*  BILITOT 0.8 1.1 1.3* 1.0  PROT 5.9* 6.3* 5.7* 5.4*  ALBUMIN 3.4* 3.4* 3.1* 2.7*   No results for input(s): LIPASE, AMYLASE in the last 168 hours. Recent Labs  Lab 12/15/20 1334  AMMONIA 33    Coagulation Profile: Recent Labs  Lab 12/15/20 1334  INR 1.2    Cardiac Enzymes: Recent Labs  Lab 12/15/20 1552  CKTOTAL 377    BNP (last 3 results) No results for input(s): PROBNP in the last 8760 hours.  Lipid Profile: No results for input(s): CHOL, HDL, LDLCALC, TRIG, CHOLHDL, LDLDIRECT in the last 72 hours.  Thyroid Function Tests: Recent Labs    12/15/20 1557  TSH 2.377    Anemia Panel: No results for input(s): VITAMINB12, FOLATE, FERRITIN,  TIBC, IRON, RETICCTPCT in the last 72 hours.  Urine analysis:    Component Value Date/Time   COLORURINE YELLOW 12/15/2020 1422   APPEARANCEUR CLEAR 12/15/2020 1422   LABSPEC 1.006 12/15/2020 1422   PHURINE 6.0 12/15/2020 1422  GLUCOSEU NEGATIVE 12/15/2020 1422   HGBUR NEGATIVE 12/15/2020 1422   BILIRUBINUR NEGATIVE 12/15/2020 1422   KETONESUR NEGATIVE 12/15/2020 1422   PROTEINUR NEGATIVE 12/15/2020 1422   UROBILINOGEN 0.2 03/02/2015 1337   NITRITE NEGATIVE 12/15/2020 1422   LEUKOCYTESUR NEGATIVE 12/15/2020 1422    Sepsis Labs: Lactic Acid, Venous    Component Value Date/Time   LATICACIDVEN 3.1 (HH) 12/15/2020 1552    MICROBIOLOGY: Recent Results (from the past 240 hour(s))  Blood culture (routine single)     Status: None (Preliminary result)   Collection Time: 12/15/20  1:20 PM   Specimen: BLOOD  Result Value Ref Range Status   Specimen Description BLOOD SITE NOT SPECIFIED  Final   Special Requests   Final    BOTTLES DRAWN AEROBIC AND ANAEROBIC Blood Culture results may not be optimal due to an inadequate volume of blood received in culture bottles   Culture   Final    NO GROWTH 3 DAYS Performed at [facility], [address], [place], [place] [address]    Report Status PENDING  Incomplete  Urine culture     Status: None   Collection Time: 12/15/20  1:20 PM   Specimen: In/Out Cath Urine  Result Value Ref Range Status   Specimen Description IN/OUT CATH URINE  Final   Special Requests NONE  Final   Culture   Final    NO GROWTH Performed at [facility], [address] [place], [place] [address]    Report Status 12/16/2020 FINAL  Final  Resp Panel by RT-PCR (Flu A&B, Covid) Nasopharyngeal Swab     Status: None   Collection Time: 12/15/20  2:17 PM   Specimen: Nasopharyngeal Swab; Nasopharyngeal(NP) swabs in vial transport medium  Result Value Ref Range Status   SARS Coronavirus 2 by RT PCR NEGATIVE NEGATIVE Final    Comment: (NOTE) SARS-CoV-2 target  nucleic acids are NOT DETECTED.  The SARS-CoV-2 RNA is generally detectable in upper respiratory specimens during the acute phase of infection. The lowest concentration of SARS-CoV-2 viral copies this assay can detect is 138 copies/mL. A negative result does not preclude SARS-Cov-2 infection and should not be used as the sole basis for treatment or other patient management decisions. A negative result may occur with  improper specimen collection/handling, submission of specimen other than nasopharyngeal swab, presence of viral mutation(s) within the areas targeted by this assay, and inadequate number of viral copies(<138 copies/mL). A negative result must be combined with clinical observations, patient history, and epidemiological information. The expected result is Negative.  Fact Sheet for Patients:  BloggerCourse.com  Fact Sheet for Healthcare Providers:  SeriousBroker.it  This test is no t yet approved or cleared by the [place] FDA and  has been authorized for detection and/or diagnosis of SARS-CoV-2 by FDA under an Emergency Use Authorization (EUA). This EUA will remain  in effect (meaning this test can be used) for the duration of the COVID-19 declaration under Section 564(b)(1) of the Act, 21 U.S.C.section 360bbb-3(b)(1), unless the authorization is terminated  or revoked sooner.       Influenza A by PCR NEGATIVE NEGATIVE Final   Influenza B by PCR NEGATIVE NEGATIVE Final    Comment: (NOTE) The Xpert Xpress SARS-CoV-2/FLU/RSV plus assay is intended as an aid in the diagnosis of influenza from Nasopharyngeal swab specimens and should not be used as a sole basis for treatment. Nasal washings and aspirates are unacceptable for Xpert Xpress SARS-CoV-2/FLU/RSV testing.  Fact Sheet for Patients: BloggerCourse.com  Fact Sheet for Healthcare  Providers: SeriousBroker.it  This test is not yet approved or cleared by the [place] FDA and has been authorized for detection and/or diagnosis of SARS-CoV-2 by FDA under an Emergency Use Authorization (EUA). This EUA will remain in effect (meaning this test can be used) for the duration of the COVID-19 declaration under Section 564(b)(1) of the Act, 21 U.S.C. section 360bbb-3(b)(1), unless the authorization is terminated or revoked.  Performed at [facility], [address], [place], [place] [address]     RADIOLOGY STUDIES/RESULTS: ECHOCARDIOGRAM COMPLETE  Result Date: 12/17/2020    ECHOCARDIOGRAM REPORT   Patient Name:   Daniel Butler Date of Exam: 12/17/2020 Medical Rec #:  [id number]           Height:       74.0 in Accession #:    [id number]          Weight:       290.6 lb Date of Birth:  1966-06-26           BSA:          2.547 m Patient Age:    54 years            BP:           115/79 mmHg Patient Gender: M                   HR:           87 bpm. Exam Location:  Inpatient Procedure: 2D Echo Indications:    atrial fibrillation  History:        Patient has no prior history of Echocardiogram examinations.                 Signs/Symptoms:leg edema; Risk Factors:Hypertension, Current                 Smoker and ETOH abuse.  Sonographer:    Delcie Roch Referring Phys: [id number] RONDELL A SMITH IMPRESSIONS  1. Left ventricular ejection fraction, by estimation, is 30 to 35%. The left ventricle has moderately decreased function. The left ventricle demonstrates global hypokinesis. Left ventricular diastolic function could not be evaluated.  2. Right ventricular systolic function is normal. The right ventricular size is normal. There is normal pulmonary artery systolic pressure.  3. Right atrial size was mildly dilated.  4. The mitral valve is normal in structure. No evidence of mitral valve regurgitation.  5. The aortic valve is normal in structure. Aortic  valve regurgitation is not visualized. FINDINGS  Left Ventricle: Left ventricular ejection fraction, by estimation, is 30 to 35%. The left ventricle has moderately decreased function. The left ventricle demonstrates global hypokinesis. The left ventricular internal cavity size was normal in size. There is no left ventricular hypertrophy. Left ventricular diastolic function could not be evaluated due to atrial fibrillation. Left ventricular diastolic function could not be evaluated. Right Ventricle: The right ventricular size is normal. No increase in right ventricular wall thickness. Right ventricular systolic function is normal. There is normal pulmonary artery systolic pressure. The tricuspid regurgitant velocity is 1.94 m/s, and  with an assumed right atrial pressure of 8 mmHg, the estimated right ventricular systolic pressure is 23.1 mmHg. Left Atrium: Left atrial size was normal in size. Right Atrium: Right atrial size was mildly dilated. Pericardium: There is no evidence of pericardial effusion. Mitral Valve: The mitral valve is normal in structure. No evidence of mitral valve regurgitation. Tricuspid Valve: The tricuspid valve  is grossly normal. Tricuspid valve regurgitation is trivial. Aortic Valve: The aortic valve is normal in structure. Aortic valve regurgitation is not visualized. Pulmonic Valve: The pulmonic valve was normal in structure. Pulmonic valve regurgitation is not visualized. Aorta: The aortic root and ascending aorta are structurally normal, with no evidence of dilitation. IAS/Shunts: The atrial septum is grossly normal.  LEFT VENTRICLE PLAX 2D LVIDd:         5.30 cm LVIDs:         3.90 cm LV PW:         1.00 cm LV IVS:        1.10 cm LVOT diam:     2.40 cm LVOT Area:     4.52 cm  IVC IVC diam: 2.30 cm LEFT ATRIUM           Index       RIGHT ATRIUM           Index LA diam:      4.20 cm 1.65 cm/m  RA Area:     18.20 cm LA Vol (A2C): 74.7 ml 29.32 ml/m RA Volume:   50.90 ml  19.98 ml/m    AORTA Ao Root diam: 4.30 cm Ao Asc diam:  3.60 cm TRICUSPID VALVE TR Peak grad:   15.1 mmHg TR Vmax:        194.00 cm/s  SHUNTS Systemic Diam: 2.40 cm Kristeen Miss MD Electronically signed by Kristeen Miss MD Signature Date/Time: 12/17/2020/4:17:22 PM    Final      LOS: 3 days   Susa Raring, MD  [facility]  12/18/2020, 8:22 AM

## 2020-12-18 NOTE — Progress Notes (Signed)
Heart Failure Stewardship Pharmacist Progress Note   PCP: Olive Bass, FNP PCP-Cardiologist: Chrystie Nose, MD    HPI:  55 yo M with PMH of HLD, HTN, brain aneurysm, tobacco use, alcohol use, anxiety, depression and gout. He presented to the ED on 12/15/20 after he was found down, intoxicated, in afib with RVR, and hypotensive. Cardiology was consulted and ECHO obtained on 12/17/20. LVEF was reduced to 30-35%.  Current HF Medications: Metoprolol tartrate 50 mg BID  Prior to admission HF Medications: Losartan 100 mg daily  Pertinent Lab Values: . Serum creatinine 1.50, BUN 13, Potassium 3.7, Sodium 137, BNP 277.6, Magnesium 1.7   Vital Signs: . Weight: 290 lbs (admission weight: 282 lbs) . Blood pressure: 110-120/70s  . Heart rate: 70-80s  Medication Assistance / Insurance Benefits Check: Does the patient have prescription insurance?  No   Does the patient qualify for medication assistance through manufacturers or grants?   Yes . Eligible grants and/or patient assistance programs: [employer] . Medication assistance applications in progress: none  . Medication assistance applications approved: none Approved medication assistance renewals will be completed by: pending  Outpatient Pharmacy:  Prior to admission outpatient [facility] Is the patient willing to use [facility] TOC pharmacy at discharge? Yes Is the patient willing to transition their outpatient pharmacy to utilize a [facility] outpatient pharmacy?   Pending    Assessment: 1. Acute on chronic systolic CHF (EF 51-70%), unknown etiology - pending R/LHC on 12/19/20. NYHA class II symptoms. - Continue metoprolol 50 mg BID - consider transitioning to XL prior to discharge - Consider starting Entresto 24/26 mg BID for HFrEF (may need to start post-cath to preserve renal function / contrast requirement) - Consider starting spironolactone 12.5 mg daily - Consider starting Farxiga 10 mg daily prior to discharge  for HFrEF   Plan: 1) Medication changes recommended at this time: - Continue current regimen pending cath tomorrow  2) Patient assistance: - Patient is uninsured - can qualify for patient assistance applications for [employer] and Farxiga  - Will consult HF CSW   3)  Education  - To be completed prior to discharge  Sharen Hones, PharmD, BCPS Heart Failure Engineer, building services Phone [phone]

## 2020-12-18 NOTE — TOC Initial Note (Signed)
Transition of Care [facility]) - Initial/Assessment Note    Patient Details  Name: Daniel Butler MRN: [id number] Date of Birth: 1965-11-03  Transition of Care [facility]) CM/SW Contact:    Joanne Chars, LCSW Phone Number: 12/18/2020, 3:52 PM  Clinical Narrative:  CSW met with pt and wife [name] regarding Cage Aide (see separate note) and discharge recommendations.  Permission given to speak with [name] present.  Pt agreeable to [facility] recommendation.  Pt is uninsured, discussed charity HH agency, one agency available.  Current equipment in home: rollator, bedside commode.   Pt is not vaccinated for covid.                  Expected Discharge Plan: [place] Barriers to Discharge: Continued Medical Work up,Inadequate or no insurance   Patient Goals and CMS Choice Patient states their goals for this hospitalization and ongoing recovery are:: "get back to work" [employer] Compare Post Acute Care list provided to::  (NA-uninsured--only one [place] agency option)    Expected Discharge Plan and Services Expected Discharge Plan: [place] In-house Referral: Clinical Social Work   Post Acute Care Choice: [place] arrangements for the past 2 months: [place]                                      Prior Living Arrangements/Services Living arrangements for the past 2 months: Single Family Home Lives with:: Spouse Patient language and need for interpreter reviewed:: Yes Do you feel safe going back to the place where you live?: Yes      Need for Family Participation in Patient Care: No (Comment) Care giver support system in place?: Yes (comment) Current home services: Other (comment) (none) Criminal Activity/Legal Involvement Pertinent to Current Situation/Hospitalization: No - Comment as needed  Activities of Daily Living Home Assistive Devices/Equipment: Walker (specify type) ADL Screening (condition at time of  admission) Patient's cognitive ability adequate to safely complete daily activities?: No Is the patient deaf or have difficulty hearing?: No Does the patient have difficulty seeing, even when wearing glasses/contacts?: No Does the patient have difficulty concentrating, remembering, or making decisions?: No Patient able to express need for assistance with ADLs?: Yes Does the patient have difficulty dressing or bathing?: Yes Independently performs ADLs?: No Communication: Independent Dressing (OT): Independent Grooming: Needs assistance Is this a change from baseline?: Pre-admission baseline Feeding: Independent Bathing: Needs assistance Is this a change from baseline?: Pre-admission baseline Toileting: Independent In/Out Bed: Independent Walks in Home: Needs assistance Is this a change from baseline?: Pre-admission baseline Does the patient have difficulty walking or climbing stairs?: Yes Weakness of Legs: Both Weakness of Arms/Hands: Both  Permission Sought/Granted Permission sought to share information with : Family Supports Permission granted to share information with : Yes, Verbal Permission Granted  Share Information with NAME: wife [name]           Emotional Assessment Appearance:: Appears stated age Attitude/Demeanor/Rapport: Engaged Affect (typically observed): Appropriate,Pleasant Orientation: : Oriented to Self,Oriented to Place,Oriented to  Time,Oriented to Situation Alcohol / Substance Use: Alcohol Use Psych Involvement: No (comment)  Admission diagnosis:  Alcohol abuse [F10.10] New onset atrial fibrillation ([place]) [I48.91] Alcohol abuse with intoxication ([place]) [F10.129] AKI (acute kidney injury) ([place]) [N17.9] Fall, initial encounter [W19.XXXA] Hypotension, unspecified hypotension type [I95.9] Altered mental status, unspecified altered mental status type [R41.82] Patient Active Problem List   Diagnosis  Date Noted  . Acute combined systolic and diastolic heart  failure ([place])   . Alcohol abuse with intoxication ([place]) 12/15/2020  . Transient hypotension 12/15/2020  . Acute metabolic encephalopathy 35/43/0148  . AKI (acute kidney injury) ([place]) 12/15/2020  . History of subarachnoid hemorrhage 12/15/2020  . Hypoalbuminemia 12/15/2020  . Atrial fibrillation ([place]) 12/15/2020  . Bilateral lower extremity edema 12/15/2020  . Fall at home, initial encounter 12/15/2020  . Alcohol abuse 06/02/2018  . Tobacco abuse 06/02/2018  . Hypertriglyceridemia 01/19/2018  . Essential hypertension 10/02/2017  . Sciatica of right side 04/11/2017  . Sleep disturbance 05/19/2015  . Anxiety state 05/19/2015  . Generalized headaches 04/18/2015  . Gout 04/18/2015  . Encounter for orogastric (OG) tube placement   . Subarachnoid hemorrhage ([place]) 03/02/2015  . Acute respiratory failure with hypoxia ([place])   . SAH (subarachnoid hemorrhage) ([place])   . Subarachnoid bleed ([place])   . Malignant hypertension    PCP:  Marrian Salvage, [place] Pharmacy:   [id number] [place], [place]Phone: [phone]: [address]- [address] [address] [address]Phone: [phone]: [phone] - [place]Phone: [phone]: [address]Phone: [phone]: [phone]    Social Determinants of Health (SDOH) Interventions    Readmission Risk Interventions No flowsheet data found.

## 2020-12-18 NOTE — Evaluation (Signed)
Occupational Therapy Evaluation Patient Details Name: Daniel Butler MRN: [id number] DOB: July 03, 1966 Today's Date: 12/18/2020    History of Present Illness Daniel Butler is a 55 y.o. 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  His wife is present at bedside and helps provide additional history along with the patient.  He had reportedly been drinking alcohol today and possibly had a beer and 2 shots prior to the fall.  His wife had left to go to the store and was only gone for less than an hour, but when she returned found him laying on the floor up from the couch.   Clinical Impression   Pt PTA: Pt living with spouse and recently requiring rollator for mobility; multiple falls reported possible relation to alcohol intake. Pt reports near independence with sink bath and other ADL. Spouse worls part time. Pt currently, limited due to decreased strength, decreased activity tolerance and decreased ability to perform ADL. Swelling in RUE inhibiting comfort level to use RUE for ADL.  Pt set-upA to modA for ADL; minA +2 for mobility and standing. BPs: supine 109/75, 95% O2, 96 BPM; BP in sitting 114/81, 92 BPM 98% O2 on RA; stand: 125/91 Pt would benefit from continued OT skilled services for ADL, mobility and endurance. OT following acutely.    Follow Up Recommendations  Home health OT;Supervision/Assistance - 24 hour (initially)    Equipment Recommendations  3 in 1 bedside commode;Wheelchair (measurements OT);Wheelchair cushion (measurements OT)    Recommendations for Other Services       Precautions / Restrictions Precautions Precautions: Fall Precaution Comments: Dizziness with upright activity, no appreciable BP drop Restrictions Weight Bearing Restrictions: No      Mobility Bed Mobility Overal bed  mobility: Needs Assistance Bed Mobility: Supine to Sit     Supine to sit: Supervision     General bed mobility comments: Cues for sequencing    Transfers Overall transfer level: Needs assistance Equipment used: Rolling walker (2 wheeled) Transfers: Sit to/from Stand Sit to Stand: Min assist;From elevated surface         General transfer comment: Cues for hand placement; exaggerated anterior shift for momentum to stand    Balance Overall balance assessment: Needs assistance Sitting-balance support: Feet supported Sitting balance-Leahy Scale: Fair     Standing balance support: Bilateral upper extremity supported;During functional activity Standing balance-Leahy Scale: Poor Standing balance comment: reliant on B UE support, min assist, legs weak in standing. Cues for standing erect, wanting to lean over                           ADL either performed or assessed with clinical judgement   ADL Overall ADL's : Needs assistance/impaired Eating/Feeding: Set up;Sitting   Grooming: Minimal assistance;Sitting   Upper Body Bathing: Minimal assistance;Sitting   Lower Body Bathing: Moderate assistance;Cueing for safety;Sitting/lateral leans;Sit to/from stand   Upper Body Dressing : Minimal assistance;Sitting   Lower Body Dressing: Moderate assistance;Sitting/lateral leans;Sit to/from stand;Cueing for safety   Toilet Transfer: Minimal assistance;+2 for physical assistance;Ambulation;RW   Toileting- Clothing Manipulation and Hygiene: Moderate assistance;Cueing for safety;Sitting/lateral lean;Sit to/from stand;+2 for physical assistance       Functional mobility during ADLs: Minimal assistance;+2 for physical assistance;Rolling walker;Cueing for safety General ADL Comments: Pt limited due to decreased strength, decreased activity tolerance and decreased abilityto perform ADL. Swelling in  RUE inhibiting comfort level to use RUE for ADL.     Vision Baseline  Vision/History: No visual deficits Patient Visual Report: No change from baseline Vision Assessment?: No apparent visual deficits     Perception     Praxis      Pertinent Vitals/Pain Pain Assessment: 0-10 Pain Score: 2  Pain Location: Grimace with movement, but also R meniscus injury Pain Descriptors / Indicators: Discomfort;Grimacing Pain Intervention(s): Monitored during session;Repositioned     Hand Dominance Right   Extremity/Trunk Assessment Upper Extremity Assessment Upper Extremity Assessment: Generalized weakness;RUE deficits/detail RUE Deficits / Details: R UE swelling elbow through digits;   Lower Extremity Assessment Lower Extremity Assessment: Generalized weakness   Cervical / Trunk Assessment Cervical / Trunk Assessment: Normal   Communication Communication Communication: No difficulties   Cognition Arousal/Alertness: Awake/alert Behavior During Therapy: WFL for tasks assessed/performed;Flat affect Overall Cognitive Status: Impaired/Different from baseline Area of Impairment: Safety/judgement                         Safety/Judgement: Decreased awareness of safety;Decreased awareness of deficits     General Comments: Pt stating that he was nervous to stand; kept walking eventhough he was dizzy.   General Comments  Spouse, [name] in room  BPs: supine 109/75, 95% O2, 96 BPM; BP in sitting 114/81, 92 BPM 98% O2 on RA; stand: 125/91.    Exercises     Shoulder Instructions      Home Living Family/patient expects to be discharged to:: Private residence Living Arrangements: Spouse/significant other Available Help at Discharge: Family;Available 24 hours/day Type of Home: House Home Access: Stairs to enter [employer] of Steps: 2 Entrance Stairs-Rails: Right Home Layout: Two level;Able to live on main level with bedroom/bathroom Alternate Level Stairs-Number of Steps: 16 Alternate Level Stairs-Rails: Right Bathroom Shower/Tub:  Chief Strategy Officer: Standard Bathroom Accessibility: Yes   Home Equipment: Walker - 4 wheels;Shower seat;Cane - single point          Prior Functioning/Environment Level of Independence: Independent with assistive device(s)        Comments: wife states patient was having trouble getting around recently using rollator        OT Problem List: Decreased strength;Decreased activity tolerance;Impaired balance (sitting and/or standing);Decreased cognition;Decreased safety awareness;Cardiopulmonary status limiting activity;Pain;Impaired UE functional use;Increased edema      OT Treatment/Interventions: Self-care/ADL training;Therapeutic exercise;Energy conservation;DME and/or AE instruction;Therapeutic activities;Patient/family education;Balance training    OT Goals(Current goals can be found in the care plan section) Acute Rehab OT Goals Patient Stated Goal: Be able to get home and back to independence OT Goal Formulation: With patient/family Time For Goal Achievement: 01/01/21 Potential to Achieve Goals: Good ADL Goals Pt Will Perform Grooming: with min guard assist;standing Pt Will Perform Lower Body Dressing: with min guard assist;sit to/from stand;sitting/lateral leans Pt Will Transfer to Toilet: with min guard assist;ambulating;regular height toilet Pt Will Perform Toileting - Clothing Manipulation and hygiene: with min guard assist;sitting/lateral leans;sit to/from stand Pt/caregiver will Perform Home Exercise Program: Increased strength;Increased ROM;Right Upper extremity;With theraband;With written HEP provided Additional ADL Goal #1: Pt will increase to x5 mins of standing OOB ADL tasks with 1 seated rest break and O2 >90%.  OT Frequency: Min 2X/week   Barriers to D/C:            Co-evaluation PT/OT/SLP Co-Evaluation/Treatment: Yes Reason for Co-Treatment: For patient/therapist safety   OT goals addressed during session: ADL's and self-care  AM-PAC OT "6 Clicks" Daily Activity     Outcome Measure Help from another person eating meals?: A Little Help from another person taking care of personal grooming?: A Little Help from another person toileting, which includes using toliet, bedpan, or urinal?: A Lot Help from another person bathing (including washing, rinsing, drying)?: A Lot Help from another person to put on and taking off regular upper body clothing?: A Little Help from another person to put on and taking off regular lower body clothing?: A Lot 6 Click Score: 15   End of Session Equipment Utilized During Treatment: Oxygen Nurse Communication: Mobility status  Activity Tolerance: Patient limited by fatigue Patient left: in chair;with call bell/phone within reach;with chair alarm set;with family/visitor present  OT Visit Diagnosis: Unsteadiness on feet (R26.81);Muscle weakness (generalized) (M62.81);Pain;Other symptoms and signs involving cognitive function                Time: 7106-2694 OT Time Calculation (min): 67 min Charges:  OT General Charges $OT Visit: 1 Visit OT Evaluation $OT Eval Moderate Complexity: 1 Mod OT Treatments $Self Care/Home Management : 8-22 mins  Flora Lipps, OTR/L Acute Rehabilitation Services Pager: [phone] Office: [phone]   Vonita Calloway C 12/18/2020, 6:19 PM

## 2020-12-18 NOTE — TOC CAGE-AID Note (Signed)
Transition of Care [facility]) - CAGE-AID Screening   Patient Details  Name: Daniel Butler MRN: [id number] Date of Birth: 21-Mar-1966  [facility]) CM/SW Contact:    Joanne Chars, LCSW Phone Number: 12/18/2020, 3:45 PM   Clinical Narrative: CSW met with pt and wife [name] to complete Cage aid.  Permission given to speak with wife.  Pt reports daily alcohol use of 6 beers and 6 shots for over a year.  Pt has many year history of alcohol use.  Pt denies drug use currently.  Pt had treatment when he was teen, but no substance abuse treatment since he has been an adult.  Pt indicating that he knows he needs to quit drinking and knows he needs treatment to do so.  Pt is [facility] resident, mostly interested in outpt treatment, uninsured.  Discussed [place] and CSW provided contact sheet with [facility] phone number to identify outpt provider.  Pt does appear motivated to quit.     CAGE-AID Screening:    Have You Ever Felt You Ought to Cut Down on Your Drinking or Drug Use?: Yes Have People Annoyed You By Critizing Your Drinking Or Drug Use?: No Have You Felt Bad Or Guilty About Your Drinking Or Drug Use?: Yes Have You Ever Had a Drink or Used Drugs First Thing In The Morning to Steady Your Nerves or to Get Rid of a Hangover?: No CAGE-AID Score: 2  Substance Abuse Education Offered: Yes  Substance abuse interventions: Patient Counseling,Other (must comment) (treatment provider contact list)

## 2020-12-18 NOTE — Progress Notes (Signed)
Physical Therapy Treatment Patient Details Name: Daniel Butler MRN: [id number] DOB: 1966-05-14 Today's Date: 12/18/2020    History of Present Illness Daniel Butler is a 55 y.o. 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  His wife is present at bedside and helps provide additional history along with the patient.  He had reportedly been drinking alcohol today and possibly had a beer and 2 shots prior to the fall.  His wife had left to go to the store and was only gone for less than an hour, but when she returned found him laying on the floor up from the couch.    PT Comments    Continuing work on functional mobility and activity tolerance;  Session focused on gentle progression OOB to transfers and walking;  Session conducted on Room Air and O2 sats remained at or above 90%, end of session, O2 stat 96%; HR Max observed 133 bpm (occurred first few minutes after standing up); BP stable throughout session; Pt reported vertigo/dizziness with upright activity; serial BPs did not indicate orthostasis; will consider getting a PT vestibular specialist to work with him while inpatient;   Able to walk in the hallway with min assist; Occasional cues for posture and deep breathing; had the recliner pushed behind pt for safety, and to increase his confidence with progressive amb;   Consider SW consult for options for support/services for alcohol and smoking cessation  Follow Up Recommendations  Home health PT;Supervision - Intermittent     Equipment Recommendations  None recommended by PT (Has a rollator RW)    Recommendations for Other Services       Precautions / Restrictions Precautions Precautions: Fall Precaution Comments: Dizziness with upright activity, no appreciable BP drop Restrictions Weight Bearing  Restrictions: No    Mobility  Bed Mobility Overal bed mobility: Needs Assistance Bed Mobility: Supine to Sit     Supine to sit: Supervision     General bed mobility comments: Gentle encouragement to participate; increased time and effort, used bedrails, but no physical assist provided    Transfers Overall transfer level: Needs assistance Equipment used: Rolling walker (2 wheeled) Transfers: Sit to/from Stand Sit to Stand: Min assist;From elevated surface         General transfer comment: Cues for hand placement and safety; slow rise with heavy dependence on anterior weight shift and UEs pushing through RW to get to fully upright standing  Ambulation/Gait Ambulation/Gait assistance: Min assist;+2 safety/equipment Gait Distance (Feet): 30 Feet Assistive device: Rolling walker (2 wheeled) Gait Pattern/deviations: Decreased step length - right;Decreased step length - left;Trunk flexed     General Gait Details: Gentle cues and encouragement to incr amb distance; upper trunk notably flexed; Walked on room air and O2 sats stayed at safe, acceptable levels   Stairs             Wheelchair Mobility    Modified Rankin (Stroke Patients Only)       Balance     Sitting balance-Leahy Scale: Fair                                      Cognition Arousal/Alertness: Awake/alert Behavior During Therapy: WFL for tasks assessed/performed;Flat affect Overall Cognitive Status: Within Functional Limits for tasks assessed (for simple mobility tasks)  General Comments: Pt stated he was nervous to get up and walk      Exercises      General Comments General comments (skin integrity, edema, etc.): Session conducted on Room Air and O2 sats remained at or above 90%, end of session, O2 stat 96%; HR Max observed 133 bpm (occurred first few minutes after standing up); BP stable throughout session      Pertinent  Vitals/Pain Pain Assessment: Faces Faces Pain Scale: Hurts a little bit Pain Location: Grimace with movement, but also R meniscus injury Pain Intervention(s): Monitored during session    Home Living Family/patient expects to be discharged to:: Private residence Living Arrangements: Spouse/significant other Available Help at Discharge: Family;Available 24 hours/day Type of Home: House Home Access: Stairs to enter Entrance Stairs-Rails: Right Home Layout: Two level;Able to live on main level with bedroom/bathroom Home Equipment: Walker - 4 wheels;Shower seat;Cane - single point      Prior Function Level of Independence: Independent with assistive device(s)      Comments: wife states patient was having trouble getting around recently using rollator   PT Goals (current goals can now be found in the care plan section) Acute Rehab PT Goals Patient Stated Goal: Be able to get home and back to independence PT Goal Formulation: With patient/family Time For Goal Achievement: 12/30/20 Potential to Achieve Goals: Good Progress towards PT goals: Progressing toward goals    Frequency    Min 3X/week      PT Plan Current plan remains appropriate    Co-evaluation PT/OT/SLP Co-Evaluation/Treatment: Yes Reason for Co-Treatment: For patient/therapist safety;To address functional/ADL transfers PT goals addressed during session: Mobility/safety with mobility        AM-PAC PT "6 Clicks" Mobility   Outcome Measure  Help needed turning from your back to your side while in a flat bed without using bedrails?: None Help needed moving from lying on your back to sitting on the side of a flat bed without using bedrails?: None Help needed moving to and from a bed to a chair (including a wheelchair)?: A Lot Help needed standing up from a chair using your arms (e.g., wheelchair or bedside chair)?: A Little Help needed to walk in hospital room?: A Lot Help needed climbing 3-5 steps with a railing?  : A Lot 6 Click Score: 17    End of Session Equipment Utilized During Treatment: Gait belt Activity Tolerance: Patient tolerated treatment well;Other (comment) (though nervous about walking) Patient left: in chair;with call bell/phone within reach;with chair alarm set;with family/visitor present Nurse Communication: Mobility status PT Visit Diagnosis: Unsteadiness on feet (R26.81);Other abnormalities of gait and mobility (R26.89);Difficulty in walking, not elsewhere classified (R26.2);Muscle weakness (generalized) (M62.81);History of falling (Z91.81)     Time: 2376-2831 PT Time Calculation (min) (ACUTE ONLY): 45 min  Charges:  $Gait Training: 23-37 mins                     Van Clines, [place]  Acute Rehabilitation Services Pager [phone] Office [phone]    Levi Aland 12/18/2020, 11:29 AM

## 2020-12-18 NOTE — Plan of Care (Signed)
  Problem: Activity: Goal: Capacity to carry out activities will improve Outcome: Progressing   Problem: Education: Goal: Knowledge of General Education information will improve Description: Including pain rating scale, medication(s)/side effects and non-pharmacologic comfort measures Outcome: Progressing   Problem: Health Behavior/Discharge Planning: Goal: Ability to manage health-related needs will improve Outcome: Progressing   Problem: Clinical Measurements: Goal: Ability to maintain clinical measurements within normal limits will improve Outcome: Progressing Goal: Will remain free from infection Outcome: Progressing Goal: Diagnostic test results will improve Outcome: Progressing Goal: Respiratory complications will improve Outcome: Progressing Goal: Cardiovascular complication will be avoided Outcome: Progressing   Problem: Activity: Goal: Risk for activity intolerance will decrease Outcome: Progressing   Problem: Nutrition: Goal: Adequate nutrition will be maintained Outcome: Progressing   Problem: Coping: Goal: Level of anxiety will decrease Outcome: Progressing   Problem: Elimination: Goal: Will not experience complications related to bowel motility Outcome: Progressing Goal: Will not experience complications related to urinary retention Outcome: Progressing   Problem: Pain Managment: Goal: General experience of comfort will improve Outcome: Progressing   Problem: Safety: Goal: Ability to remain free from injury will improve Outcome: Progressing   Problem: Skin Integrity: Goal: Risk for impaired skin integrity will decrease Outcome: Progressing   Problem: Education: Goal: Ability to demonstrate management of disease process will improve Outcome: Progressing Goal: Ability to verbalize understanding of medication therapies will improve Outcome: Progressing Goal: Individualized Educational Video(s) Outcome: Progressing   Problem: Cardiac: Goal:  Ability to achieve and maintain adequate cardiopulmonary perfusion will improve Outcome: Progressing   

## 2020-12-18 NOTE — Progress Notes (Addendum)
Progress Note  Patient Name: Daniel Butler Date of Encounter: 12/18/2020  [facility] Cardiologist: Chrystie Nose, MD   Subjective   Reports feeling improved, on room air  Inpatient Medications    Scheduled Meds: . chlordiazePOXIDE  10 mg Oral TID  . enoxaparin (LOVENOX) injection  60 mg Subcutaneous Q24H  . fenofibrate  160 mg Oral Daily  . folic acid  1 mg Oral Daily  . furosemide  20 mg Oral Once  . icosapent Ethyl  2 g Oral BID  . LORazepam  0-4 mg Intravenous Q12H  . metoprolol tartrate  25 mg Oral BID  . multivitamin with minerals  1 tablet Oral Daily  . potassium chloride  40 mEq Oral Once  . rosuvastatin  40 mg Oral Daily  . thiamine  100 mg Intravenous Daily   Continuous Infusions: . amiodarone 30 mg/hr (12/18/20 0744)   PRN Meds: albuterol, HYDROcodone-acetaminophen, levalbuterol, LORazepam **OR** LORazepam, metoprolol tartrate   Vital Signs    Vitals:   12/17/20 2333 12/18/20 0400 12/18/20 0517 12/18/20 0746  BP: 108/60  101/67 107/73  Pulse: 83 60 62 91  Resp: 19  18 20   Temp: 98.5 F (36.9 C)  98.6 F (37 C) 97.8 F (36.6 C)  TempSrc: Oral  Oral Oral  SpO2: 97%  97% 95%  Weight:      Height:        Intake/Output Summary (Last 24 hours) at 12/18/2020 0842 Last data filed at 12/18/2020 0744 Gross per 24 hour  Intake 561.57 ml  Output 1950 ml  Net -1388.43 ml   Last 3 Weights 12/17/2020 12/16/2020 12/15/2020  Weight (lbs) 290 lb 9.1 oz 282 lb 10.1 oz 265 lb  Weight (kg) 131.8 kg 128.2 kg 120.203 kg      Telemetry    AF 80-90s- Personally Reviewed  ECG    No new EKG - Personally Reviewed  Physical Exam   GEN: No acute distress.   Neck: No JVD Cardiac: irregular, normal rate, no murmurs, rubs, or gallops.  Respiratory: Expiratory wheezing GI: Soft, nontender MS: 1+ BLE edema Neuro:  Nonfocal  Psych: Normal affect   Labs    High Sensitivity Troponin:  No results for input(s): TROPONINIHS in the last 720 hours.     Chemistry Recent Labs  Lab 12/16/20 0136 12/17/20 0112 12/18/20 0147  NA 147* 140 137  K 4.6 3.9 3.7  CL 115* 107 102  CO2 19* 26 25  GLUCOSE 90 98 107*  BUN 18 17 13   CREATININE 1.75* 1.66* 1.50*  CALCIUM 8.9 8.3* 8.4*  PROT 6.3* 5.7* 5.4*  ALBUMIN 3.4* 3.1* 2.7*  AST 57* 35 26  ALT 24 21 16   ALKPHOS 27* 28* 24*  BILITOT 1.1 1.3* 1.0  GFRNONAA 45* 48* 55*  ANIONGAP 13 7 10      Hematology Recent Labs  Lab 12/16/20 0136 12/17/20 0112 12/18/20 0147  WBC 7.2 8.6 10.1  RBC 4.33 3.97* 3.97*  HGB 15.4 14.3 14.3  HCT 47.5 42.9 42.4  MCV 109.7* 108.1* 106.8*  MCH 35.6* 36.0* 36.0*  MCHC 32.4 33.3 33.7  RDW 15.7* 15.0 14.6  PLT 227 196 166    BNP Recent Labs  Lab 12/15/20 1334 12/15/20 2221 12/18/20 0147  BNP 107.5* 129.6* 277.6*     DDimer  Recent Labs  Lab 12/15/20 1334  DDIMER 0.61*     Radiology    ECHOCARDIOGRAM COMPLETE  Result Date: 12/17/2020    ECHOCARDIOGRAM REPORT   Patient Name:  Daniel Butler Date of Exam: 12/17/2020 Medical Rec #:  [id number]           Height:       74.0 in Accession #:    [id number]          Weight:       290.6 lb Date of Birth:  12/20/1965           BSA:          2.547 m Patient Age:    54 years            BP:           115/79 mmHg Patient Gender: M                   HR:           87 bpm. Exam Location:  Inpatient Procedure: 2D Echo Indications:    atrial fibrillation  History:        Patient has no prior history of Echocardiogram examinations.                 Signs/Symptoms:leg edema; Risk Factors:Hypertension, Current                 Smoker and ETOH abuse.  Sonographer:    Delcie Roch Referring Phys: [id number] RONDELL A SMITH IMPRESSIONS  1. Left ventricular ejection fraction, by estimation, is 30 to 35%. The left ventricle has moderately decreased function. The left ventricle demonstrates global hypokinesis. Left ventricular diastolic function could not be evaluated.  2. Right ventricular systolic function is normal.  The right ventricular size is normal. There is normal pulmonary artery systolic pressure.  3. Right atrial size was mildly dilated.  4. The mitral valve is normal in structure. No evidence of mitral valve regurgitation.  5. The aortic valve is normal in structure. Aortic valve regurgitation is not visualized. FINDINGS  Left Ventricle: Left ventricular ejection fraction, by estimation, is 30 to 35%. The left ventricle has moderately decreased function. The left ventricle demonstrates global hypokinesis. The left ventricular internal cavity size was normal in size. There is no left ventricular hypertrophy. Left ventricular diastolic function could not be evaluated due to atrial fibrillation. Left ventricular diastolic function could not be evaluated. Right Ventricle: The right ventricular size is normal. No increase in right ventricular wall thickness. Right ventricular systolic function is normal. There is normal pulmonary artery systolic pressure. The tricuspid regurgitant velocity is 1.94 m/s, and  with an assumed right atrial pressure of 8 mmHg, the estimated right ventricular systolic pressure is 23.1 mmHg. Left Atrium: Left atrial size was normal in size. Right Atrium: Right atrial size was mildly dilated. Pericardium: There is no evidence of pericardial effusion. Mitral Valve: The mitral valve is normal in structure. No evidence of mitral valve regurgitation. Tricuspid Valve: The tricuspid valve is grossly normal. Tricuspid valve regurgitation is trivial. Aortic Valve: The aortic valve is normal in structure. Aortic valve regurgitation is not visualized. Pulmonic Valve: The pulmonic valve was normal in structure. Pulmonic valve regurgitation is not visualized. Aorta: The aortic root and ascending aorta are structurally normal, with no evidence of dilitation. IAS/Shunts: The atrial septum is grossly normal.  LEFT VENTRICLE PLAX 2D LVIDd:         5.30 cm LVIDs:         3.90 cm LV PW:         1.00 cm LV IVS:         1.10 cm LVOT  diam:     2.40 cm LVOT Area:     4.52 cm  IVC IVC diam: 2.30 cm LEFT ATRIUM           Index       RIGHT ATRIUM           Index LA diam:      4.20 cm 1.65 cm/m  RA Area:     18.20 cm LA Vol (A2C): 74.7 ml 29.32 ml/m RA Volume:   50.90 ml  19.98 ml/m   AORTA Ao Root diam: 4.30 cm Ao Asc diam:  3.60 cm TRICUSPID VALVE TR Peak grad:   15.1 mmHg TR Vmax:        194.00 cm/s  SHUNTS Systemic Diam: 2.40 cm Kristeen Miss MD Electronically signed by Kristeen Miss MD Signature Date/Time: 12/17/2020/4:17:22 PM    Final     Cardiac Studies   Echo 12/18/19: 1. Left ventricular ejection fraction, by estimation, is 30 to 35%. The  left ventricle has moderately decreased function. The left ventricle  demonstrates global hypokinesis. Left ventricular diastolic function could  not be evaluated.  2. Right ventricular systolic function is normal. The right ventricular  size is normal. There is normal pulmonary artery systolic pressure.  3. Right atrial size was mildly dilated.  4. The mitral valve is normal in structure. No evidence of mitral valve  regurgitation.  5. The aortic valve is normal in structure. Aortic valve regurgitation is  not visualized.   Patient Profile     55 y.o. male  with a hx of hyperlipidemia followed by 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  New diagnosis.  Differential includes tachycardia induced, EtOH cardiomyopathy, and ischemic CM (pLAD coronary calcifications on CT) -Counseled on risk of alcohol use, recommend cessation -Plan RHC/LHC.  Risks and benefits of cardiac catheterization have been discussed with the patient and his wife.  These include bleeding, infection, kidney damage, stroke, heart attack, death.  The patient understands these risks and is willing to  proceed.  Atrial fib with RVR: new diagnosis.  Started on metoprolol and IV amiodarone -Unfortunately not a good anticoagulation candidate given issues with falls related to alcohol use, and also h/o SAH with MCA aneurysm s/p clipping in 2016.  Rate control strategy recommended -Continue metoprolol 50 mg BID, titrate as needed for rate control -Recommend discontinuing amiodarone given risk of chemical cardioversion as has not been on anticoagulation  AKI: Cr 2.1 on admission, has improved to 1.5 today.  Continue to monitor   For questions or updates, please contact [facility] Please consult [email or web address] for contact info under        Signed, Little Ishikawa, MD  12/18/2020, 8:42 AM

## 2020-12-18 NOTE — Progress Notes (Signed)
Pt stated that his back hurt. Ordered med and pt is sleeping. Will reassess.

## 2020-12-19 ENCOUNTER — Inpatient Hospital Stay (HOSPITAL_COMMUNITY)
Admission: EM | Disposition: A | Discharge status: Home / Self Care | Payer: Self-pay | Source: Home / Self Care | Attending: Internal Medicine

## 2020-12-19 ENCOUNTER — Encounter (HOSPITAL_COMMUNITY): Payer: Self-pay | Admitting: Cardiology

## 2020-12-19 DIAGNOSIS — I5043 Acute on chronic combined systolic (congestive) and diastolic (congestive) heart failure: Secondary | ICD-10-CM

## 2020-12-19 DIAGNOSIS — I4819 Other persistent atrial fibrillation: Secondary | ICD-10-CM

## 2020-12-19 HISTORY — PX: RIGHT/LEFT HEART CATH AND CORONARY ANGIOGRAPHY: CATH118266

## 2020-12-19 LAB — POCT I-STAT EG7
Acid-Base Excess: 3 mmol/L — ABNORMAL HIGH (ref 0.0–2.0)
Bicarbonate: 28.7 mmol/L — ABNORMAL HIGH (ref 20.0–28.0)
Calcium, Ion: 1.18 mmol/L (ref 1.15–1.40)
HCT: 44 % (ref 39.0–52.0)
Hemoglobin: 15 g/dL (ref 13.0–17.0)
O2 Saturation: 63 %
Potassium: 4.1 mmol/L (ref 3.5–5.1)
Sodium: 139 mmol/L (ref 135–145)
TCO2: 30 mmol/L (ref 22–32)
pCO2, Ven: 47.5 mmHg (ref 44.0–60.0)
pH, Ven: 7.39 (ref 7.250–7.430)
pO2, Ven: 34 mmHg (ref 32.0–45.0)

## 2020-12-19 LAB — CBC WITH DIFFERENTIAL/PLATELET
Abs Immature Granulocytes: 0.05 10*3/uL (ref 0.00–0.07)
Basophils Absolute: 0 10*3/uL (ref 0.0–0.1)
Basophils Relative: 0 %
Eosinophils Absolute: 0.2 10*3/uL (ref 0.0–0.5)
Eosinophils Relative: 2 %
HCT: 45.6 % (ref 39.0–52.0)
Hemoglobin: 15.2 g/dL (ref 13.0–17.0)
Immature Granulocytes: 1 %
Lymphocytes Relative: 18 %
Lymphs Abs: 1.7 10*3/uL (ref 0.7–4.0)
MCH: 36 pg — ABNORMAL HIGH (ref 26.0–34.0)
MCHC: 33.3 g/dL (ref 30.0–36.0)
MCV: 108.1 fL — ABNORMAL HIGH (ref 80.0–100.0)
Monocytes Absolute: 0.6 10*3/uL (ref 0.1–1.0)
Monocytes Relative: 6 %
Neutro Abs: 6.8 10*3/uL (ref 1.7–7.7)
Neutrophils Relative %: 73 %
Platelets: 179 10*3/uL (ref 150–400)
RBC: 4.22 MIL/uL (ref 4.22–5.81)
RDW: 14.6 % (ref 11.5–15.5)
WBC: 9.3 10*3/uL (ref 4.0–10.5)
nRBC: 0 % (ref 0.0–0.2)

## 2020-12-19 LAB — COMPREHENSIVE METABOLIC PANEL
ALT: 24 U/L (ref 0–44)
AST: 53 U/L — ABNORMAL HIGH (ref 15–41)
Albumin: 2.9 g/dL — ABNORMAL LOW (ref 3.5–5.0)
Alkaline Phosphatase: 29 U/L — ABNORMAL LOW (ref 38–126)
Anion gap: 8 (ref 5–15)
BUN: 15 mg/dL (ref 6–20)
CO2: 27 mmol/L (ref 22–32)
Calcium: 8.5 mg/dL — ABNORMAL LOW (ref 8.9–10.3)
Chloride: 102 mmol/L (ref 98–111)
Creatinine, Ser: 1.47 mg/dL — ABNORMAL HIGH (ref 0.61–1.24)
GFR, Estimated: 56 mL/min — ABNORMAL LOW (ref >60)
Glucose, Bld: 101 mg/dL — ABNORMAL HIGH (ref 70–99)
Potassium: 3.9 mmol/L (ref 3.5–5.1)
Sodium: 137 mmol/L (ref 135–145)
Total Bilirubin: 1.2 mg/dL (ref 0.3–1.2)
Total Protein: 5.7 g/dL — ABNORMAL LOW (ref 6.5–8.1)

## 2020-12-19 LAB — BRAIN NATRIURETIC PEPTIDE: B Natriuretic Peptide: 845.8 pg/mL — ABNORMAL HIGH (ref 0.0–100.0)

## 2020-12-19 LAB — MAGNESIUM: Magnesium: 1.9 mg/dL (ref 1.7–2.4)

## 2020-12-19 SURGERY — RIGHT/LEFT HEART CATH AND CORONARY ANGIOGRAPHY
Anesthesia: LOCAL

## 2020-12-19 MED ORDER — FENTANYL CITRATE (PF) 100 MCG/2ML IJ SOLN
INTRAMUSCULAR | Status: DC | PRN
  Administered 2020-12-19: 25 ug via INTRAVENOUS

## 2020-12-19 MED ORDER — HEPARIN (PORCINE) IN NACL 1000-0.9 UT/500ML-% IV SOLN
INTRAVENOUS | Status: DC | PRN
  Administered 2020-12-19 (×2): 500 mL

## 2020-12-19 MED ORDER — HEPARIN SODIUM (PORCINE) 1000 UNIT/ML IJ SOLN
INTRAMUSCULAR | Status: AC
  Filled 2020-12-19: qty 1

## 2020-12-19 MED ORDER — HEPARIN SODIUM (PORCINE) 1000 UNIT/ML IJ SOLN
INTRAMUSCULAR | Status: DC | PRN
  Administered 2020-12-19: 6000 [IU] via INTRAVENOUS

## 2020-12-19 MED ORDER — VERAPAMIL HCL 2.5 MG/ML IV SOLN
INTRAVENOUS | Status: AC
  Filled 2020-12-19: qty 2

## 2020-12-19 MED ORDER — MIDAZOLAM HCL 2 MG/2ML IJ SOLN
INTRAMUSCULAR | Status: DC | PRN
  Administered 2020-12-19: 1 mg via INTRAVENOUS

## 2020-12-19 MED ORDER — HEPARIN (PORCINE) IN NACL 1000-0.9 UT/500ML-% IV SOLN
INTRAVENOUS | Status: AC
  Filled 2020-12-19: qty 1000

## 2020-12-19 MED ORDER — THIAMINE HCL 100 MG PO TABS
100.0000 mg | ORAL_TABLET | Freq: Every day | ORAL | Status: DC
  Administered 2020-12-20 – 2020-12-23 (×4): 100 mg via ORAL
  Filled 2020-12-19 (×4): qty 1

## 2020-12-19 MED ORDER — IOHEXOL 350 MG/ML SOLN
INTRAVENOUS | Status: DC | PRN
  Administered 2020-12-19: 75 mL

## 2020-12-19 MED ORDER — CHLORDIAZEPOXIDE HCL 5 MG PO CAPS
5.0000 mg | ORAL_CAPSULE | Freq: Two times a day (BID) | ORAL | Status: DC
  Administered 2020-12-19 – 2020-12-20 (×4): 5 mg via ORAL
  Filled 2020-12-19 (×4): qty 1

## 2020-12-19 MED ORDER — ACETAMINOPHEN 325 MG PO TABS
650.0000 mg | ORAL_TABLET | Freq: Four times a day (QID) | ORAL | Status: DC | PRN
  Administered 2020-12-19: 650 mg via ORAL
  Filled 2020-12-19: qty 2

## 2020-12-19 MED ORDER — MIDAZOLAM HCL 2 MG/2ML IJ SOLN
INTRAMUSCULAR | Status: AC
  Filled 2020-12-19: qty 2

## 2020-12-19 MED ORDER — FENTANYL CITRATE (PF) 100 MCG/2ML IJ SOLN
INTRAMUSCULAR | Status: AC
  Filled 2020-12-19: qty 2

## 2020-12-19 MED ORDER — LORAZEPAM 2 MG/ML IJ SOLN
0.0000 mg | Freq: Two times a day (BID) | INTRAMUSCULAR | Status: DC
  Administered 2020-12-20: 0.5 mg via INTRAVENOUS
  Administered 2020-12-20: 0.25 mg via INTRAVENOUS
  Administered 2020-12-21: 1 mg via INTRAVENOUS
  Filled 2020-12-19 (×3): qty 1

## 2020-12-19 MED ORDER — LIDOCAINE HCL (PF) 1 % IJ SOLN
INTRAMUSCULAR | Status: DC | PRN
  Administered 2020-12-19: 2 mL

## 2020-12-19 MED ORDER — POTASSIUM CHLORIDE 2 MEQ/ML IV SOLN
INTRAVENOUS | Status: AC
  Filled 2020-12-19: qty 1000

## 2020-12-19 MED ORDER — LIDOCAINE HCL (PF) 1 % IJ SOLN
INTRAMUSCULAR | Status: AC
  Filled 2020-12-19: qty 30

## 2020-12-19 MED ORDER — TRAZODONE HCL 50 MG PO TABS
50.0000 mg | ORAL_TABLET | Freq: Every evening | ORAL | Status: DC | PRN
  Administered 2020-12-20 – 2020-12-22 (×4): 50 mg via ORAL
  Filled 2020-12-19 (×4): qty 1

## 2020-12-19 SURGICAL SUPPLY — 12 items
CATH 5FR JL3.5 JR4 ANG PIG MP (CATHETERS) ×2 IMPLANT
CATH SWAN GANZ 7F STRAIGHT (CATHETERS) ×2 IMPLANT
DEVICE RAD COMP TR BAND LRG (VASCULAR PRODUCTS) ×2 IMPLANT
GLIDESHEATH SLENDER 7FR .021G (SHEATH) ×2 IMPLANT
GUIDEWIRE INQWIRE 1.5J.035X260 (WIRE) ×1 IMPLANT
INQWIRE 1.5J .035X260CM (WIRE) ×2 IMPLANT
KIT HEART LEFT (KITS) ×2 IMPLANT
PACK CARDIAC CATHETERIZATION (CUSTOM PROCEDURE TRAY) ×2 IMPLANT
SHEATH RAIN RADIAL 21G 6FR (SHEATH) ×2 IMPLANT
TRANSDUCER W/STOPCOCK (MISCELLANEOUS) ×2 IMPLANT
TUBING CIL FLEX 10 FLL-RA (TUBING) ×2 IMPLANT
WIRE EMERALD 3MM-J .025X260CM (WIRE) ×2 IMPLANT

## 2020-12-19 NOTE — TOC Progression Note (Addendum)
Transition of Care [facility]) - Progression Note    Patient Details  Name: Daniel Butler MRN: [id number] Date of Birth: [birth year]  Transition of Care [facility]) CM/SW Contact  Erin Sons, [place] Phone Number: 12/19/2020, 8:53 AM  Clinical Narrative:     Made referral to [facility]; they will review pt.  1135: [facility] is unable to accept pt. OPPT will need to be arranged.     Expected Discharge Plan: Home w Home Health Services Barriers to Discharge: Continued Medical Work up,Inadequate or no insurance  Expected Discharge Plan and Services Expected Discharge Plan: Home w Home Health Services In-house Referral: Clinical Social Work   Post Acute Care Choice: Home Health Living arrangements for the past 2 months: Single Family Home                                       Social Determinants of Health (SDOH) Interventions    Readmission Risk Interventions No flowsheet data found.

## 2020-12-19 NOTE — Progress Notes (Signed)
PROGRESS NOTE        PATIENT DETAILS Name: Daniel Butler Age: 55 y.o. Sex: male Date of Birth: 20-Aug-1966 Admit Date: 12/15/2020 Admitting Physician Clydie Braun, MD [id number], Allyne Gee, FNP  Brief Narrative: Patient is a 55 y.o. male significant past medical history of alcohol abuse, SAH-s/p craniotomy with clipping in 2016, HTN, HLD, depression/anxiety-who presented to the hospital in the setting of a mechanical fall due to alcohol intoxication.  In the ED-patient was evaluated by trauma services-and found to have no major injuries.  However he was found to have A. fib with RVR, significant lower extremity edema, AKI-started on amiodarone infusion and admitted to the hospitalist service.  See below for further details.  Significant events: 4/8>> fall-alcohol intoxication-brought to ED-found to be in RVR/AKI/lower extremity edema.  Admit to [facility]  Significant studies: 4/8>> CT head: No intracranial abnormalities 4/8>> CT C-spine: No signs of cervical fracture or dislocation 4/8>> CT chest: No acute findings in the chest-nonspecific pulmonary nodules bilaterally 4/8>> CT abdomen/pelvis: No acute injury/acute findings 4/8>> EtOH level: 237 4/8>> acute hepatitis serology: Negative 4/8>> TSH: 2.37 4/10> TTE - 1. Left ventricular ejection fraction, by estimation, is 30 to 35%. The left ventricle has moderately decreased function. The left ventricle demonstrates global hypokinesis. Left ventricular diastolic function could not be evaluated.  2. Right ventricular systolic function is normal. The right ventricular size is normal. There is normal pulmonary artery systolic pressure.  3. Right atrial size was mildly dilated.  4. The mitral valve is normal in structure. No evidence of mitral valve regurgitation.  5. The aortic valve is normal in structure. Aortic valve regurgitation is not visualized  Antimicrobial therapy: None  Microbiology data: 4/8>>  blood culture: Pending 4/8>> urine culture: Pending  Procedures : None  Consults: Trauma, cardiology  DVT Prophylaxis : Place TED hose Start: 12/17/20 0831 SCDs Start: 12/15/20 1557  Prophylactic Lovenox   Subjective:  Patient in bed, appears comfortable but mildly confused, denies any headache, no fever, no chest pain or pressure, no shortness of breath , no abdominal pain. No new focal weakness.   Assessment/Plan:  Mechanical fall: Likely due to alcohol intoxication and use of Xanax-await  PT/OT eval.  Imaging negative for fractures/acute injuries.  Per spouse-has had multiple falls over the past 2 weeks.  A. fib with RVR: Stable TSH, echo noted, cardiology on board.  [place] vas 2 score of at least 1, not a good long-term candidate for anticoagulation given history of alcohol use/falls-and prior SAH.  He was initially on amiodarone now better rate controlled on low-dose beta-blocker.  Newly diagnosed Acute on chronic systolic heart failure EF 30% with hypokinesis.  Will let cardiology opine, currently on beta-blocker, ACE/ARB if renal function improves and blood pressure can tolerate it.  Has been adequately diuresed, cardiology on board and due to undergo left heart cath on 12/19/2020.  Acute toxic metabolic encephalopathy: Due to alcohol intoxication-and now mild DTs, overall stable.  No focal deficits.  AKI: Likely hemodynamically mediated-patient had transient hypotension in the emergency room-furthermore he also appears to be on losartan.  Improving with optimization of fluid status.  ?  Dysphagia: Due to encephalopathy, speech on board.  Lactic acidosis: Due to hypoperfusion in the setting of hypotension-no indication of sepsis.  Mild transaminitis: Probably mild alcoholic hepatitis-acute hepatitis serology negative.  No signs of cirrhosis on imaging.  HLD: Remains on fenofibrate, Vascepa, Crestor-watch LFTs closely.  HTN: BP stable-B Blocker.  Alcohol abuse: Has somewhat  of a flushed appearance-but no tremors-is awake/alert-on Ativan per CIWA protocol.  Remains at risk for progression to DTs.  Last drink was 4/8.  Drinks ATLEAST 6 pack of beer daily + 2-3 shots of liquor+daily Xanax use, has been counseled to quit alcohol.  Will gently titrate down Librium.  Bilateral pulmonary nodules: Per radiology-repeat CT chest in 12 months  Debility/deconditioning/multiple falls: Await PT/OT eval  Morbid Obesity: Estimated body mass index is 37.31 kg/m as calculated from the following:   Height as of this encounter: 6\' 2"  (1.88 m).   Weight as of this encounter: 131.8 kg.    Diet: Diet Order            Diet NPO time specified Except for: Sips with Meds  Diet effective midnight                  Code Status:  Full code   Family Communication:  Spouse-[name]- [phone]- over the phone 12/17/20  Disposition Plan:  Status is: Inpatient  Remains inpatient appropriate because:Inpatient level of care appropriate due to severity of illness   Dispo: The patient is from: Home              Anticipated d/c is to: Home              Patient currently is not medically stable to d/c.   Difficult to place patient No    Barriers to Discharge:   Afib RVR-on amiodarone infusion  Antimicrobial agents:  Anti-infectives (From admission, onward)   Start     Dose/Rate Route Frequency Ordered Stop   12/15/20 1400  vancomycin (VANCOREADY) IVPB 2000 mg/400 mL        2,000 mg 200 mL/hr over 120 Minutes Intravenous  Once 12/15/20 1358 12/15/20 1621   12/15/20 1400  ceFEPIme (MAXIPIME) 2 g in sodium chloride 0.9 % 100 mL IVPB        2 g 200 mL/hr over 30 Minutes Intravenous  Once 12/15/20 1358 12/15/20 1447       Time spent: 35 minutes-Greater than 50% of this time was spent in counseling, explanation of diagnosis, planning of further management, and coordination of care.  MEDICATIONS: Scheduled Meds: . allopurinol  100 mg Oral Daily  . chlordiazePOXIDE  5 mg Oral  BID  . enoxaparin (LOVENOX) injection  60 mg Subcutaneous Q24H  . fenofibrate  160 mg Oral Daily  . folic acid  1 mg Oral Daily  . icosapent Ethyl  2 g Oral BID  . LORazepam  0-4 mg Intravenous Q12H  . metoprolol tartrate  50 mg Oral BID  . multivitamin with minerals  1 tablet Oral Daily  . rosuvastatin  40 mg Oral Daily  . thiamine  100 mg Intravenous Daily   Continuous Infusions: . lactated ringers with kcl     PRN Meds:.albuterol, HYDROcodone-acetaminophen, levalbuterol, metoprolol tartrate   PHYSICAL EXAM: Vital signs: Vitals:   12/18/20 2003 12/18/20 2121 12/19/20 0053 12/19/20 0354  BP: 107/66 115/86 115/77 127/72  Pulse: 77 99 79 86  Resp: 17  18 16   Temp: 98.5 F (36.9 C)  97.9 F (36.6 C) 98 F (36.7 C)  TempSrc: Oral  Axillary Oral  SpO2: 93%  98% 98%  Weight:      Height:       Filed Weights   12/15/20 1307 12/16/20 0322 12/17/20 0336  Weight: 120.2 kg  128.2 kg 131.8 kg   Body mass index is 37.31 kg/m.   Gen Exam:  Awake but minimally confused, No new F.N deficits,   [place].AT,PERRAL Supple Neck,No JVD, No cervical lymphadenopathy appriciated.  Symmetrical Chest wall movement, Good air movement bilaterally, CTAB RRR,No Gallops, Rubs or new Murmurs, No Parasternal Heave +ve B.Sounds, Abd Soft, No tenderness, No organomegaly appriciated, No rebound - guarding or rigidity. No Cyanosis, TEDs with trace leg edema  I have personally reviewed following labs and imaging studies  LABORATORY DATA: CBC: Recent Labs  Lab 12/15/20 1334 12/16/20 0136 12/17/20 0112 12/18/20 0147 12/19/20 0413  WBC 6.6 7.2 8.6 10.1 9.3  NEUTROABS 3.8  --   --  7.6 6.8  HGB 14.0 15.4 14.3 14.3 15.2  HCT 42.9 47.5 42.9 42.4 45.6  MCV 111.1* 109.7* 108.1* 106.8* 108.1*  PLT 232 227 196 166 179    Basic Metabolic Panel: Recent Labs  Lab 12/15/20 1334 12/15/20 1552 12/16/20 0136 12/17/20 0112 12/18/20 0147 12/19/20 0413  NA 142  --  147* 140 137 137  K 3.7  --  4.6 3.9  3.7 3.9  CL 110  --  115* 107 102 102  CO2 24  --  19* 26 25 27   GLUCOSE 108*  --  90 98 107* 101*  BUN 23*  --  18 17 13 15   CREATININE 2.08*  --  1.75* 1.66* 1.50* 1.47*  CALCIUM 8.3*  --  8.9 8.3* 8.4* 8.5*  MG  --  2.2  --   --  1.7 1.9  PHOS  --  3.3  --   --   --   --     GFR: Estimated Creatinine Clearance: 81.9 mL/min (A) (by C-G formula based on SCr of 1.47 mg/dL (H)).  Liver Function Tests: Recent Labs  Lab 12/15/20 1334 12/16/20 0136 12/17/20 0112 12/18/20 0147 12/19/20 0413  AST 49* 57* 35 26 53*  ALT 23 24 21 16 24   ALKPHOS 26* 27* 28* 24* 29*  BILITOT 0.8 1.1 1.3* 1.0 1.2  PROT 5.9* 6.3* 5.7* 5.4* 5.7*  ALBUMIN 3.4* 3.4* 3.1* 2.7* 2.9*   No results for input(s): LIPASE, AMYLASE in the last 168 hours. Recent Labs  Lab 12/15/20 1334  AMMONIA 33    Coagulation Profile: Recent Labs  Lab 12/15/20 1334  INR 1.2    Cardiac Enzymes: Recent Labs  Lab 12/15/20 1552  CKTOTAL 377    BNP (last 3 results) No results for input(s): PROBNP in the last 8760 hours.  Lipid Profile: No results for input(s): CHOL, HDL, LDLCALC, TRIG, CHOLHDL, LDLDIRECT in the last 72 hours.  Thyroid Function Tests: No results for input(s): TSH, T4TOTAL, FREET4, T3FREE, THYROIDAB in the last 72 hours.  Anemia Panel: No results for input(s): VITAMINB12, FOLATE, FERRITIN, TIBC, IRON, RETICCTPCT in the last 72 hours.  Urine analysis:    Component Value Date/Time   COLORURINE YELLOW 12/15/2020 1422   APPEARANCEUR CLEAR 12/15/2020 1422   LABSPEC 1.006 12/15/2020 1422   PHURINE 6.0 12/15/2020 1422   GLUCOSEU NEGATIVE 12/15/2020 1422   HGBUR NEGATIVE 12/15/2020 1422   BILIRUBINUR NEGATIVE 12/15/2020 1422   KETONESUR NEGATIVE 12/15/2020 1422   PROTEINUR NEGATIVE 12/15/2020 1422   UROBILINOGEN 0.2 03/02/2015 1337   NITRITE NEGATIVE 12/15/2020 1422   LEUKOCYTESUR NEGATIVE 12/15/2020 1422    Sepsis Labs: Lactic Acid, Venous    Component Value Date/Time   LATICACIDVEN 3.1  (HH) 12/15/2020 1552    MICROBIOLOGY: Recent Results (from the past 240 hour(s))  Blood culture (routine single)     Status: None (Preliminary result)   Collection Time: 12/15/20  1:20 PM   Specimen: BLOOD  Result Value Ref Range Status   Specimen Description BLOOD SITE NOT SPECIFIED  Final   Special Requests   Final    BOTTLES DRAWN AEROBIC AND ANAEROBIC Blood Culture results may not be optimal due to an inadequate volume of blood received in culture bottles   Culture   Final    NO GROWTH 4 DAYS Performed at [facility], [address], [place], [place] [address]    Report Status PENDING  Incomplete  Urine culture     Status: None   Collection Time: 12/15/20  1:20 PM   Specimen: In/Out Cath Urine  Result Value Ref Range Status   Specimen Description IN/OUT CATH URINE  Final   Special Requests NONE  Final   Culture   Final    NO GROWTH Performed at [facility], [address], [place], [place] [address]    Report Status 12/16/2020 FINAL  Final  Resp Panel by RT-PCR (Flu A&B, Covid) Nasopharyngeal Swab     Status: None   Collection Time: 12/15/20  2:17 PM   Specimen: Nasopharyngeal Swab; Nasopharyngeal(NP) swabs in vial transport medium  Result Value Ref Range Status   SARS Coronavirus 2 by RT PCR NEGATIVE NEGATIVE Final    Comment: (NOTE) SARS-CoV-2 target nucleic acids are NOT DETECTED.  The SARS-CoV-2 RNA is generally detectable in upper respiratory specimens during the acute phase of infection. The lowest concentration of SARS-CoV-2 viral copies this assay can detect is 138 copies/mL. A negative result does not preclude SARS-Cov-2 infection and should not be used as the sole basis for treatment or other patient management decisions. A negative result may occur with  improper specimen collection/handling, submission of specimen other than nasopharyngeal swab, presence of viral mutation(s) within the areas targeted by this assay, and inadequate number of  viral copies(<138 copies/mL). A negative result must be combined with clinical observations, patient history, and epidemiological information. The expected result is Negative.  Fact Sheet for Patients:  BloggerCourse.com  Fact Sheet for Healthcare Providers:  SeriousBroker.it  This test is no t yet approved or cleared by the [place] FDA and  has been authorized for detection and/or diagnosis of SARS-CoV-2 by FDA under an Emergency Use Authorization (EUA). This EUA will remain  in effect (meaning this test can be used) for the duration of the COVID-19 declaration under Section 564(b)(1) of the Act, 21 U.S.C.section 360bbb-3(b)(1), unless the authorization is terminated  or revoked sooner.       Influenza A by PCR NEGATIVE NEGATIVE Final   Influenza B by PCR NEGATIVE NEGATIVE Final    Comment: (NOTE) The Xpert Xpress SARS-CoV-2/FLU/RSV plus assay is intended as an aid in the diagnosis of influenza from Nasopharyngeal swab specimens and should not be used as a sole basis for treatment. Nasal washings and aspirates are unacceptable for Xpert Xpress SARS-CoV-2/FLU/RSV testing.  Fact Sheet for Patients: BloggerCourse.com  Fact Sheet for Healthcare Providers: SeriousBroker.it  This test is not yet approved or cleared by the [place] FDA and has been authorized for detection and/or diagnosis of SARS-CoV-2 by FDA under an Emergency Use Authorization (EUA). This EUA will remain in effect (meaning this test can be used) for the duration of the COVID-19 declaration under Section 564(b)(1) of the Act, 21 U.S.C. section 360bbb-3(b)(1), unless the authorization is terminated or revoked.  Performed at [facility]  [facility], [address] [place], [place] [address]     RADIOLOGY STUDIES/RESULTS: ECHOCARDIOGRAM COMPLETE  Result Date: 12/17/2020    ECHOCARDIOGRAM REPORT    Patient Name:   Denese KillingsMICHAEL G Slevin Date of Exam: 12/17/2020 Medical Rec #:  [id number]           Height:       74.0 in Accession #:    [id number]          Weight:       290.6 lb Date of Birth:  1966/03/22           BSA:          2.547 m Patient Age:    54 years            BP:           115/79 mmHg Patient Gender: M                   HR:           87 bpm. Exam Location:  Inpatient Procedure: 2D Echo Indications:    atrial fibrillation  History:        Patient has no prior history of Echocardiogram examinations.                 Signs/Symptoms:leg edema; Risk Factors:Hypertension, Current                 Smoker and ETOH abuse.  Sonographer:    Delcie RochLauren Pennington Referring Phys: [id number] RONDELL A SMITH IMPRESSIONS  1. Left ventricular ejection fraction, by estimation, is 30 to 35%. The left ventricle has moderately decreased function. The left ventricle demonstrates global hypokinesis. Left ventricular diastolic function could not be evaluated.  2. Right ventricular systolic function is normal. The right ventricular size is normal. There is normal pulmonary artery systolic pressure.  3. Right atrial size was mildly dilated.  4. The mitral valve is normal in structure. No evidence of mitral valve regurgitation.  5. The aortic valve is normal in structure. Aortic valve regurgitation is not visualized. FINDINGS  Left Ventricle: Left ventricular ejection fraction, by estimation, is 30 to 35%. The left ventricle has moderately decreased function. The left ventricle demonstrates global hypokinesis. The left ventricular internal cavity size was normal in size. There is no left ventricular hypertrophy. Left ventricular diastolic function could not be evaluated due to atrial fibrillation. Left ventricular diastolic function could not be evaluated. Right Ventricle: The right ventricular size is normal. No increase in right ventricular wall thickness. Right ventricular systolic function is normal. There is normal pulmonary artery  systolic pressure. The tricuspid regurgitant velocity is 1.94 m/s, and  with an assumed right atrial pressure of 8 mmHg, the estimated right ventricular systolic pressure is 23.1 mmHg. Left Atrium: Left atrial size was normal in size. Right Atrium: Right atrial size was mildly dilated. Pericardium: There is no evidence of pericardial effusion. Mitral Valve: The mitral valve is normal in structure. No evidence of mitral valve regurgitation. Tricuspid Valve: The tricuspid valve is grossly normal. Tricuspid valve regurgitation is trivial. Aortic Valve: The aortic valve is normal in structure. Aortic valve regurgitation is not visualized. Pulmonic Valve: The pulmonic valve was normal in structure. Pulmonic valve regurgitation is not visualized. Aorta: The aortic root and ascending aorta are structurally normal, with no evidence of dilitation. IAS/Shunts: The atrial septum is grossly normal.  LEFT VENTRICLE PLAX 2D LVIDd:         5.30 cm LVIDs:  3.90 cm LV PW:         1.00 cm LV IVS:        1.10 cm LVOT diam:     2.40 cm LVOT Area:     4.52 cm  IVC IVC diam: 2.30 cm LEFT ATRIUM           Index       RIGHT ATRIUM           Index LA diam:      4.20 cm 1.65 cm/m  RA Area:     18.20 cm LA Vol (A2C): 74.7 ml 29.32 ml/m RA Volume:   50.90 ml  19.98 ml/m   AORTA Ao Root diam: 4.30 cm Ao Asc diam:  3.60 cm TRICUSPID VALVE TR Peak grad:   15.1 mmHg TR Vmax:        194.00 cm/s  SHUNTS Systemic Diam: 2.40 cm Kristeen Miss MD Electronically signed by Kristeen Miss MD Signature Date/Time: 12/17/2020/4:17:22 PM    Final      LOS: 4 days   Susa Raring, MD  [facility]  12/19/2020, 8:36 AM

## 2020-12-19 NOTE — Progress Notes (Signed)
Pt doesn't understand procedure. Will wait for CARDS team to come speak w/ pt.

## 2020-12-19 NOTE — Progress Notes (Signed)
Progress Note  Patient Name: Daniel Butler Date of Encounter: 12/19/2020  [facility] Cardiologist: Chrystie Nose, MD   Subjective   Denies any dyspnea or chest pain.  Able to lie flat without dyspnea  Inpatient Medications    Scheduled Meds: . allopurinol  100 mg Oral Daily  . chlordiazePOXIDE  5 mg Oral BID  . enoxaparin (LOVENOX) injection  60 mg Subcutaneous Q24H  . fenofibrate  160 mg Oral Daily  . folic acid  1 mg Oral Daily  . icosapent Ethyl  2 g Oral BID  . LORazepam  0-4 mg Intravenous Q12H  . metoprolol tartrate  50 mg Oral BID  . multivitamin with minerals  1 tablet Oral Daily  . rosuvastatin  40 mg Oral Daily  . thiamine  100 mg Intravenous Daily   Continuous Infusions: . lactated ringers with kcl     PRN Meds: albuterol, HYDROcodone-acetaminophen, levalbuterol, metoprolol tartrate   Vital Signs    Vitals:   12/18/20 2003 12/18/20 2121 12/19/20 0053 12/19/20 0354  BP: 107/66 115/86 115/77 127/72  Pulse: 77 99 79 86  Resp: 17  18 16   Temp: 98.5 F (36.9 C)  97.9 F (36.6 C) 98 F (36.7 C)  TempSrc: Oral  Axillary Oral  SpO2: 93%  98% 98%  Weight:      Height:        Intake/Output Summary (Last 24 hours) at 12/19/2020 0824 Last data filed at 12/19/2020 0354 Gross per 24 hour  Intake 156.12 ml  Output 1550 ml  Net -1393.88 ml   Last 3 Weights 12/17/2020 12/16/2020 12/15/2020  Weight (lbs) 290 lb 9.1 oz 282 lb 10.1 oz 265 lb  Weight (kg) 131.8 kg 128.2 kg 120.203 kg      Telemetry    AF 80-90s- Personally Reviewed  ECG    No new EKG - Personally Reviewed  Physical Exam   GEN: No acute distress.   Neck: No JVD Cardiac: irregular, normal rate, no murmurs, rubs, or gallops.  Respiratory: Expiratory wheezing GI: Soft, nontender MS: trace BLE edema Neuro:  Nonfocal  Psych: Normal affect   Labs    High Sensitivity Troponin:  No results for input(s): TROPONINIHS in the last 720 hours.    Chemistry Recent Labs  Lab  12/17/20 0112 12/18/20 0147 12/19/20 0413  NA 140 137 137  K 3.9 3.7 3.9  CL 107 102 102  CO2 26 25 27   GLUCOSE 98 107* 101*  BUN 17 13 15   CREATININE 1.66* 1.50* 1.47*  CALCIUM 8.3* 8.4* 8.5*  PROT 5.7* 5.4* 5.7*  ALBUMIN 3.1* 2.7* 2.9*  AST 35 26 53*  ALT 21 16 24   ALKPHOS 28* 24* 29*  BILITOT 1.3* 1.0 1.2  GFRNONAA 48* 55* 56*  ANIONGAP 7 10 8      Hematology Recent Labs  Lab 12/17/20 0112 12/18/20 0147 12/19/20 0413  WBC 8.6 10.1 9.3  RBC 3.97* 3.97* 4.22  HGB 14.3 14.3 15.2  HCT 42.9 42.4 45.6  MCV 108.1* 106.8* 108.1*  MCH 36.0* 36.0* 36.0*  MCHC 33.3 33.7 33.3  RDW 15.0 14.6 14.6  PLT 196 166 179    BNP Recent Labs  Lab 12/15/20 2221 12/18/20 0147 12/19/20 0413  BNP 129.6* 277.6* 845.8*     DDimer  Recent Labs  Lab 12/15/20 1334  DDIMER 0.61*     Radiology    ECHOCARDIOGRAM COMPLETE  Result Date: 12/17/2020    ECHOCARDIOGRAM REPORT   Patient Name:   MILLAN LEGAN  Jakob Date of Exam: 12/17/2020 Medical Rec #:  [id number]           Height:       74.0 in Accession #:    [id number]          Weight:       290.6 lb Date of Birth:  12-06-1965           BSA:          2.547 m Patient Age:    55 years            BP:           115/79 mmHg Patient Gender: M                   HR:           87 bpm. Exam Location:  Inpatient Procedure: 2D Echo Indications:    atrial fibrillation  History:        Patient has no prior history of Echocardiogram examinations.                 Signs/Symptoms:leg edema; Risk Factors:Hypertension, Current                 Smoker and ETOH abuse.  Sonographer:    Delcie Roch Referring Phys: [id number] RONDELL A SMITH IMPRESSIONS  1. Left ventricular ejection fraction, by estimation, is 30 to 35%. The left ventricle has moderately decreased function. The left ventricle demonstrates global hypokinesis. Left ventricular diastolic function could not be evaluated.  2. Right ventricular systolic function is normal. The right ventricular size is  normal. There is normal pulmonary artery systolic pressure.  3. Right atrial size was mildly dilated.  4. The mitral valve is normal in structure. No evidence of mitral valve regurgitation.  5. The aortic valve is normal in structure. Aortic valve regurgitation is not visualized. FINDINGS  Left Ventricle: Left ventricular ejection fraction, by estimation, is 30 to 35%. The left ventricle has moderately decreased function. The left ventricle demonstrates global hypokinesis. The left ventricular internal cavity size was normal in size. There is no left ventricular hypertrophy. Left ventricular diastolic function could not be evaluated due to atrial fibrillation. Left ventricular diastolic function could not be evaluated. Right Ventricle: The right ventricular size is normal. No increase in right ventricular wall thickness. Right ventricular systolic function is normal. There is normal pulmonary artery systolic pressure. The tricuspid regurgitant velocity is 1.94 m/s, and  with an assumed right atrial pressure of 8 mmHg, the estimated right ventricular systolic pressure is 23.1 mmHg. Left Atrium: Left atrial size was normal in size. Right Atrium: Right atrial size was mildly dilated. Pericardium: There is no evidence of pericardial effusion. Mitral Valve: The mitral valve is normal in structure. No evidence of mitral valve regurgitation. Tricuspid Valve: The tricuspid valve is grossly normal. Tricuspid valve regurgitation is trivial. Aortic Valve: The aortic valve is normal in structure. Aortic valve regurgitation is not visualized. Pulmonic Valve: The pulmonic valve was normal in structure. Pulmonic valve regurgitation is not visualized. Aorta: The aortic root and ascending aorta are structurally normal, with no evidence of dilitation. IAS/Shunts: The atrial septum is grossly normal.  LEFT VENTRICLE PLAX 2D LVIDd:         5.30 cm LVIDs:         3.90 cm LV PW:         1.00 cm LV IVS:        1.10 cm LVOT diam:  2.40  cm LVOT Area:     4.52 cm  IVC IVC diam: 2.30 cm LEFT ATRIUM           Index       RIGHT ATRIUM           Index LA diam:      4.20 cm 1.65 cm/m  RA Area:     18.20 cm LA Vol (A2C): 74.7 ml 29.32 ml/m RA Volume:   50.90 ml  19.98 ml/m   AORTA Ao Root diam: 4.30 cm Ao Asc diam:  3.60 cm TRICUSPID VALVE TR Peak grad:   15.1 mmHg TR Vmax:        194.00 cm/s  SHUNTS Systemic Diam: 2.40 cm Philip Nahser MD Electronically signed by Philip Nahser MD Signature Date/Time: 12/17/2020/4:17:22 PM    Final     Cardiac Studies   Echo 12/18/19: 1. Left ventricular ejection fraction, by estimation, is 30 to 35%. The  left ventricle has moderately decreased function. The left ventricle  demonstrates global hypokinesis. Left ventricular diastolic function could  not be evaluated.  2. Right ventricular systolic function is normal. The right ventricular  size is normal. There is normal pulmonary artery systolic pressure.  3. Right atrial size was mildly dilated.  4. The mitral valve is normal in structure. No evidence of mitral valve  regurgitation.  5. The aortic valve is normal in structure. Aortic valve regurgitation is  not visualized.   Patient Profile     54 y.o. male  with a hx of hyperlipidemia followed by Dr. Hilty, HTN, brain aneurysm, tobacco use, ETOH use, anxiety/depression and gout with presents with AF with RVR and new heart failure  Assessment & Plan    Acute combined systolic and diastolic heart failure: EF 30-35% on echo 4/10.  New diagnosis.  Differential includes tachycardia induced, EtOH cardiomyopathy, and ischemic CM (pLAD coronary calcifications on CT) -Counseled on risk of alcohol use, recommend cessation -Plan RHC/LHC today.  Risks and benefits of cardiac catheterization have been discussed with the patient and his wife.  These include bleeding, infection, kidney damage, stroke, heart attack, death.  The patient understands these risks and is willing to proceed. -Continue  metoprolol, will consolidate to toprol XL on discharge -Plan to add entresto if stable renal function after cath -Appears mildly hypervolemic, will f/u results of RHC to guide diuresis  Atrial fib with RVR: new diagnosis.  CHADSVASc score 2 (CHF, HTN).  Started on metoprolol and IV amiodarone -Unfortunately not a good anticoagulation candidate given issues with falls related to alcohol use, and also h/o SAH with MCA aneurysm s/p clipping in 2016.  Rate control strategy recommended -Continue metoprolol 50 mg BID, titrate as needed for rate control -Discontinued amiodarone given risk of chemical cardioversion as has not been on anticoagulation  AKI: Cr 2.1 on admission, now appears stable at 1.5.  Continue to monitor   For questions or updates, please contact [facility] Please consult [email or web address] for contact info under        Signed, Bernadette Gores L Quanta Roher, MD  12/19/2020, 8:24 AM    

## 2020-12-19 NOTE — H&P (View-Only) (Signed)
Progress Note  Patient Name: Daniel Butler Date of Encounter: 12/19/2020  [facility] Cardiologist: Chrystie Nose, MD   Subjective   Denies any dyspnea or chest pain.  Able to lie flat without dyspnea  Inpatient Medications    Scheduled Meds: . allopurinol  100 mg Oral Daily  . chlordiazePOXIDE  5 mg Oral BID  . enoxaparin (LOVENOX) injection  60 mg Subcutaneous Q24H  . fenofibrate  160 mg Oral Daily  . folic acid  1 mg Oral Daily  . icosapent Ethyl  2 g Oral BID  . LORazepam  0-4 mg Intravenous Q12H  . metoprolol tartrate  50 mg Oral BID  . multivitamin with minerals  1 tablet Oral Daily  . rosuvastatin  40 mg Oral Daily  . thiamine  100 mg Intravenous Daily   Continuous Infusions: . lactated ringers with kcl     PRN Meds: albuterol, HYDROcodone-acetaminophen, levalbuterol, metoprolol tartrate   Vital Signs    Vitals:   12/18/20 2003 12/18/20 2121 12/19/20 0053 12/19/20 0354  BP: 107/66 115/86 115/77 127/72  Pulse: 77 99 79 86  Resp: 17  18 16   Temp: 98.5 F (36.9 C)  97.9 F (36.6 C) 98 F (36.7 C)  TempSrc: Oral  Axillary Oral  SpO2: 93%  98% 98%  Weight:      Height:        Intake/Output Summary (Last 24 hours) at 12/19/2020 0824 Last data filed at 12/19/2020 0354 Gross per 24 hour  Intake 156.12 ml  Output 1550 ml  Net -1393.88 ml   Last 3 Weights 12/17/2020 12/16/2020 12/15/2020  Weight (lbs) 290 lb 9.1 oz 282 lb 10.1 oz 265 lb  Weight (kg) 131.8 kg 128.2 kg 120.203 kg      Telemetry    AF 80-90s- Personally Reviewed  ECG    No new EKG - Personally Reviewed  Physical Exam   GEN: No acute distress.   Neck: No JVD Cardiac: irregular, normal rate, no murmurs, rubs, or gallops.  Respiratory: Expiratory wheezing GI: Soft, nontender MS: trace BLE edema Neuro:  Nonfocal  Psych: Normal affect   Labs    High Sensitivity Troponin:  No results for input(s): TROPONINIHS in the last 720 hours.    Chemistry Recent Labs  Lab  12/17/20 0112 12/18/20 0147 12/19/20 0413  NA 140 137 137  K 3.9 3.7 3.9  CL 107 102 102  CO2 26 25 27   GLUCOSE 98 107* 101*  BUN 17 13 15   CREATININE 1.66* 1.50* 1.47*  CALCIUM 8.3* 8.4* 8.5*  PROT 5.7* 5.4* 5.7*  ALBUMIN 3.1* 2.7* 2.9*  AST 35 26 53*  ALT 21 16 24   ALKPHOS 28* 24* 29*  BILITOT 1.3* 1.0 1.2  GFRNONAA 48* 55* 56*  ANIONGAP 7 10 8      Hematology Recent Labs  Lab 12/17/20 0112 12/18/20 0147 12/19/20 0413  WBC 8.6 10.1 9.3  RBC 3.97* 3.97* 4.22  HGB 14.3 14.3 15.2  HCT 42.9 42.4 45.6  MCV 108.1* 106.8* 108.1*  MCH 36.0* 36.0* 36.0*  MCHC 33.3 33.7 33.3  RDW 15.0 14.6 14.6  PLT 196 166 179    BNP Recent Labs  Lab 12/15/20 2221 12/18/20 0147 12/19/20 0413  BNP 129.6* 277.6* 845.8*     DDimer  Recent Labs  Lab 12/15/20 1334  DDIMER 0.61*     Radiology    ECHOCARDIOGRAM COMPLETE  Result Date: 12/17/2020    ECHOCARDIOGRAM REPORT   Patient Name:   Daniel Butler  Daniel Butler Date of Exam: 12/17/2020 Medical Rec #:  [id number]           Height:       74.0 in Accession #:    [id number]          Weight:       290.6 lb Date of Birth:  12-06-1965           BSA:          2.547 m Patient Age:    54 years            BP:           115/79 mmHg Patient Gender: M                   HR:           87 bpm. Exam Location:  Inpatient Procedure: 2D Echo Indications:    atrial fibrillation  History:        Patient has no prior history of Echocardiogram examinations.                 Signs/Symptoms:leg edema; Risk Factors:Hypertension, Current                 Smoker and ETOH abuse.  Sonographer:    Delcie Roch Referring Phys: [id number] RONDELL A SMITH IMPRESSIONS  1. Left ventricular ejection fraction, by estimation, is 30 to 35%. The left ventricle has moderately decreased function. The left ventricle demonstrates global hypokinesis. Left ventricular diastolic function could not be evaluated.  2. Right ventricular systolic function is normal. The right ventricular size is  normal. There is normal pulmonary artery systolic pressure.  3. Right atrial size was mildly dilated.  4. The mitral valve is normal in structure. No evidence of mitral valve regurgitation.  5. The aortic valve is normal in structure. Aortic valve regurgitation is not visualized. FINDINGS  Left Ventricle: Left ventricular ejection fraction, by estimation, is 30 to 35%. The left ventricle has moderately decreased function. The left ventricle demonstrates global hypokinesis. The left ventricular internal cavity size was normal in size. There is no left ventricular hypertrophy. Left ventricular diastolic function could not be evaluated due to atrial fibrillation. Left ventricular diastolic function could not be evaluated. Right Ventricle: The right ventricular size is normal. No increase in right ventricular wall thickness. Right ventricular systolic function is normal. There is normal pulmonary artery systolic pressure. The tricuspid regurgitant velocity is 1.94 m/s, and  with an assumed right atrial pressure of 8 mmHg, the estimated right ventricular systolic pressure is 23.1 mmHg. Left Atrium: Left atrial size was normal in size. Right Atrium: Right atrial size was mildly dilated. Pericardium: There is no evidence of pericardial effusion. Mitral Valve: The mitral valve is normal in structure. No evidence of mitral valve regurgitation. Tricuspid Valve: The tricuspid valve is grossly normal. Tricuspid valve regurgitation is trivial. Aortic Valve: The aortic valve is normal in structure. Aortic valve regurgitation is not visualized. Pulmonic Valve: The pulmonic valve was normal in structure. Pulmonic valve regurgitation is not visualized. Aorta: The aortic root and ascending aorta are structurally normal, with no evidence of dilitation. IAS/Shunts: The atrial septum is grossly normal.  LEFT VENTRICLE PLAX 2D LVIDd:         5.30 cm LVIDs:         3.90 cm LV PW:         1.00 cm LV IVS:        1.10 cm LVOT diam:  2.40  cm LVOT Area:     4.52 cm  IVC IVC diam: 2.30 cm LEFT ATRIUM           Index       RIGHT ATRIUM           Index LA diam:      4.20 cm 1.65 cm/m  RA Area:     18.20 cm LA Vol (A2C): 74.7 ml 29.32 ml/m RA Volume:   50.90 ml  19.98 ml/m   AORTA Ao Root diam: 4.30 cm Ao Asc diam:  3.60 cm TRICUSPID VALVE TR Peak grad:   15.1 mmHg TR Vmax:        194.00 cm/s  SHUNTS Systemic Diam: 2.40 cm Kristeen Miss MD Electronically signed by Kristeen Miss MD Signature Date/Time: 12/17/2020/4:17:22 PM    Final     Cardiac Studies   Echo 12/18/19: 1. Left ventricular ejection fraction, by estimation, is 30 to 35%. The  left ventricle has moderately decreased function. The left ventricle  demonstrates global hypokinesis. Left ventricular diastolic function could  not be evaluated.  2. Right ventricular systolic function is normal. The right ventricular  size is normal. There is normal pulmonary artery systolic pressure.  3. Right atrial size was mildly dilated.  4. The mitral valve is normal in structure. No evidence of mitral valve  regurgitation.  5. The aortic valve is normal in structure. Aortic valve regurgitation is  not visualized.   Patient Profile     55 y.o. male  with a hx of hyperlipidemia followed by 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  New diagnosis.  Differential includes tachycardia induced, EtOH cardiomyopathy, and ischemic CM (pLAD coronary calcifications on CT) -Counseled on risk of alcohol use, recommend cessation -Plan RHC/LHC today.  Risks and benefits of cardiac catheterization have been discussed with the patient and his wife.  These include bleeding, infection, kidney damage, stroke, heart attack, death.  The patient understands these risks and is willing to proceed. -Continue  metoprolol, will consolidate to toprol XL on discharge -Plan to add entresto if stable renal function after cath -Appears mildly hypervolemic, will f/u results of RHC to guide diuresis  Atrial fib with RVR: new diagnosis.  CHADSVASc score 2 (CHF, HTN).  Started on metoprolol and IV amiodarone -Unfortunately not a good anticoagulation candidate given issues with falls related to alcohol use, and also h/o SAH with MCA aneurysm s/p clipping in 2016.  Rate control strategy recommended -Continue metoprolol 50 mg BID, titrate as needed for rate control -Discontinued amiodarone given risk of chemical cardioversion as has not been on anticoagulation  AKI: Cr 2.1 on admission, now appears stable at 1.5.  Continue to monitor   For questions or updates, please contact [facility] Please consult [email or web address] for contact info under        Signed, Little Ishikawa, MD  12/19/2020, 8:24 AM

## 2020-12-19 NOTE — Interval H&P Note (Signed)
History and Physical Interval Note:  12/19/2020 11:18 AM  Daniel Butler  has presented today for surgery, with the diagnosis of chf.  The various methods of treatment have been discussed with the patient and family. After consideration of risks, benefits and other options for treatment, the patient has consented to  Procedure(s): RIGHT/LEFT HEART CATH AND CORONARY ANGIOGRAPHY (N/A) as a surgical intervention.  The patient's history has been reviewed, patient examined, no change in status, stable for surgery.  I have reviewed the patient's chart and labs.  Questions were answered to the patient's satisfaction.   Cath Lab Visit (complete for each Cath Lab visit)  Clinical Evaluation Leading to the Procedure:   ACS: No.  Non-ACS:    Anginal Classification: No Symptoms  Anti-ischemic medical therapy: Minimal Therapy (1 class of medications)  Non-Invasive Test Results: No non-invasive testing performed  Prior CABG: No previous CABG        Theron Arista [facility] 12/19/2020 11:19 AM

## 2020-12-19 NOTE — Progress Notes (Signed)
Heart Failure Stewardship Pharmacist Progress Note   PCP: Olive Bass, FNP PCP-Cardiologist: Chrystie Nose, MD    HPI:  55 yo M with PMH of HLD, HTN, brain aneurysm, tobacco use, alcohol use, anxiety, depression and gout. He presented to the ED on 12/15/20 after he was found down, intoxicated, in afib with RVR, and hypotensive. Cardiology was consulted and ECHO obtained on 12/17/20. LVEF was reduced to 30-35%. Pending cath today.  Current HF Medications: Metoprolol tartrate 50 mg BID  Prior to admission HF Medications: Losartan 100 mg daily  Pertinent Lab Values: . Serum creatinine 1.47, BUN 15, Potassium 3.9, Sodium 137, BNP 277.6, Magnesium 1.9  Vital Signs: . Weight: 290 lbs (admission weight: 282 lbs) . Blood pressure: 130/70s  . Heart rate: 90s  Medication Assistance / Insurance Benefits Check: Does the patient have prescription insurance?  No   Does the patient qualify for medication assistance through manufacturers or grants?   Yes . Eligible grants and/or patient assistance programs: [employer] . Medication assistance applications in progress: none  . Medication assistance applications approved: none Approved medication assistance renewals will be completed by: pending  Outpatient Pharmacy:  Prior to admission outpatient [facility]: [facility] Is the patient willing to use [facility]OC pharmacy at discharge? Yes Is the patient willing to transition their outpatient pharmacy to utilize a [facility] outpatient pharmacy?   Pending    Assessment: 1. Acute on chronic systolic CHF (EF 00-37%), unknown etiology - pending R/LHC today. NYHA class II symptoms. - Continue metoprolol 50 mg BID - consider transitioning to XL prior to discharge - Consider starting Entresto 24/26 mg BID for HFrEF (may need to start post-cath to preserve renal function / contrast requirement) - Consider starting spironolactone 12.5 mg daily - Consider starting Farxiga 10 mg daily prior to  discharge for HFrEF   Plan: 1) Medication changes recommended at this time: - Continue current regimen pending cath today  2) Patient assistance: - Patient is uninsured - can qualify for patient assistance applications for [employer] and Farxiga  - Consulted HF CSW   3)  Education  - To be completed prior to discharge  Sharen Hones, PharmD, BCPS Heart Failure Engineer, building services Phone [phone]

## 2020-12-19 NOTE — Progress Notes (Signed)
Heart Failure Nurse Navigator Progress Note  Attempted to screen pt for HV TOC readiness. Pt off unit at cardiac cath. Spouse at bedside, explained my position as navigator and plan to interview pt once return to unit. Spouse states pt primary cardiologist is [name].   Ozella Rocks, RN, BSN Heart Failure Nurse Navigator [phone]

## 2020-12-20 LAB — CBC WITH DIFFERENTIAL/PLATELET
Abs Immature Granulocytes: 0.06 10*3/uL (ref 0.00–0.07)
Basophils Absolute: 0 10*3/uL (ref 0.0–0.1)
Basophils Relative: 1 %
Eosinophils Absolute: 0.2 10*3/uL (ref 0.0–0.5)
Eosinophils Relative: 2 %
HCT: 50.5 % (ref 39.0–52.0)
Hemoglobin: 16.8 g/dL (ref 13.0–17.0)
Immature Granulocytes: 1 %
Lymphocytes Relative: 19 %
Lymphs Abs: 1.7 10*3/uL (ref 0.7–4.0)
MCH: 35.1 pg — ABNORMAL HIGH (ref 26.0–34.0)
MCHC: 33.3 g/dL (ref 30.0–36.0)
MCV: 105.6 fL — ABNORMAL HIGH (ref 80.0–100.0)
Monocytes Absolute: 0.3 10*3/uL (ref 0.1–1.0)
Monocytes Relative: 4 %
Neutro Abs: 6.6 10*3/uL (ref 1.7–7.7)
Neutrophils Relative %: 73 %
Platelets: UNDETERMINED 10*3/uL (ref 150–400)
RBC: 4.78 MIL/uL (ref 4.22–5.81)
RDW: 14.3 % (ref 11.5–15.5)
WBC: 8.9 10*3/uL (ref 4.0–10.5)
nRBC: 0 % (ref 0.0–0.2)

## 2020-12-20 LAB — COMPREHENSIVE METABOLIC PANEL
ALT: 22 U/L (ref 0–44)
AST: 49 U/L — ABNORMAL HIGH (ref 15–41)
Albumin: 2.8 g/dL — ABNORMAL LOW (ref 3.5–5.0)
Alkaline Phosphatase: 29 U/L — ABNORMAL LOW (ref 38–126)
Anion gap: 7 (ref 5–15)
BUN: 17 mg/dL (ref 6–20)
CO2: 24 mmol/L (ref 22–32)
Calcium: 8.8 mg/dL — ABNORMAL LOW (ref 8.9–10.3)
Chloride: 107 mmol/L (ref 98–111)
Creatinine, Ser: 1.43 mg/dL — ABNORMAL HIGH (ref 0.61–1.24)
GFR, Estimated: 58 mL/min — ABNORMAL LOW (ref >60)
Glucose, Bld: 89 mg/dL (ref 70–99)
Potassium: 5.5 mmol/L — ABNORMAL HIGH (ref 3.5–5.1)
Sodium: 138 mmol/L (ref 135–145)
Total Bilirubin: 1.3 mg/dL — ABNORMAL HIGH (ref 0.3–1.2)
Total Protein: 5.5 g/dL — ABNORMAL LOW (ref 6.5–8.1)

## 2020-12-20 LAB — CULTURE, BLOOD (SINGLE): Culture: NO GROWTH

## 2020-12-20 LAB — BRAIN NATRIURETIC PEPTIDE: B Natriuretic Peptide: 511.7 pg/mL — ABNORMAL HIGH (ref 0.0–100.0)

## 2020-12-20 LAB — MAGNESIUM: Magnesium: 2.1 mg/dL (ref 1.7–2.4)

## 2020-12-20 MED ORDER — FUROSEMIDE 10 MG/ML IJ SOLN
20.0000 mg | Freq: Once | INTRAMUSCULAR | Status: AC
  Administered 2020-12-20: 20 mg via INTRAVENOUS
  Filled 2020-12-20: qty 2

## 2020-12-20 MED ORDER — APIXABAN 5 MG PO TABS
5.0000 mg | ORAL_TABLET | Freq: Two times a day (BID) | ORAL | Status: DC
  Administered 2020-12-20 – 2020-12-23 (×7): 5 mg via ORAL
  Filled 2020-12-20 (×7): qty 1

## 2020-12-20 MED ORDER — SODIUM POLYSTYRENE SULFONATE 15 GM/60ML PO SUSP
30.0000 g | Freq: Once | ORAL | Status: AC
  Administered 2020-12-20: 30 g via ORAL
  Filled 2020-12-20: qty 120

## 2020-12-20 MED ORDER — DAPAGLIFLOZIN PROPANEDIOL 10 MG PO TABS: 10.0000 mg | ORAL_TABLET | Freq: Every day | ORAL | Status: DC

## 2020-12-20 MED ORDER — METOPROLOL SUCCINATE ER 100 MG PO TB24: 100.0000 mg | ORAL_TABLET | Freq: Every day | ORAL | Status: DC

## 2020-12-20 MED ORDER — SODIUM CHLORIDE 0.9% FLUSH: 3.0000 mL | Freq: Two times a day (BID) | INTRAVENOUS | Status: DC

## 2020-12-20 MED ORDER — ACETAMINOPHEN 325 MG PO TABS: 650.0000 mg | ORAL_TABLET | ORAL | Status: DC | PRN

## 2020-12-20 MED ORDER — ONDANSETRON HCL 4 MG/2ML IJ SOLN: 4.0000 mg | Freq: Four times a day (QID) | INTRAMUSCULAR | Status: DC | PRN

## 2020-12-20 MED ORDER — DAPAGLIFLOZIN PROPANEDIOL 10 MG PO TABS
10.0000 mg | ORAL_TABLET | Freq: Every day | ORAL | Status: DC
  Administered 2020-12-20 – 2020-12-23 (×4): 10 mg via ORAL
  Filled 2020-12-20 (×4): qty 1

## 2020-12-20 MED ORDER — SODIUM CHLORIDE 0.9 % IV SOLN: 250.0000 mL | INTRAVENOUS | Status: DC | PRN

## 2020-12-20 MED ORDER — METOPROLOL SUCCINATE ER 100 MG PO TB24
100.0000 mg | ORAL_TABLET | Freq: Every day | ORAL | Status: DC
  Administered 2020-12-20 – 2020-12-23 (×4): 100 mg via ORAL
  Filled 2020-12-20 (×5): qty 1

## 2020-12-20 MED ORDER — SODIUM CHLORIDE 0.9% FLUSH: 3.0000 mL | INTRAVENOUS | Status: DC | PRN

## 2020-12-20 MED FILL — Verapamil HCl IV Soln 2.5 MG/ML: INTRAVENOUS | Qty: 2 | Status: AC

## 2020-12-20 NOTE — Plan of Care (Signed)
  Problem: Activity: Goal: Capacity to carry out activities will improve Outcome: Progressing   Problem: Education: Goal: Knowledge of General Education information will improve Description: Including pain rating scale, medication(s)/side effects and non-pharmacologic comfort measures Outcome: Progressing   Problem: Health Behavior/Discharge Planning: Goal: Ability to manage health-related needs will improve Outcome: Progressing   Problem: Clinical Measurements: Goal: Ability to maintain clinical measurements within normal limits will improve Outcome: Progressing Goal: Will remain free from infection Outcome: Progressing Goal: Diagnostic test results will improve Outcome: Progressing Goal: Respiratory complications will improve Outcome: Progressing Goal: Cardiovascular complication will be avoided Outcome: Progressing   Problem: Activity: Goal: Risk for activity intolerance will decrease Outcome: Progressing   Problem: Nutrition: Goal: Adequate nutrition will be maintained Outcome: Progressing   Problem: Coping: Goal: Level of anxiety will decrease Outcome: Progressing   Problem: Elimination: Goal: Will not experience complications related to bowel motility Outcome: Progressing Goal: Will not experience complications related to urinary retention Outcome: Progressing   Problem: Pain Managment: Goal: General experience of comfort will improve Outcome: Progressing   Problem: Safety: Goal: Ability to remain free from injury will improve Outcome: Progressing   Problem: Skin Integrity: Goal: Risk for impaired skin integrity will decrease Outcome: Progressing   Problem: Education: Goal: Ability to demonstrate management of disease process will improve Outcome: Progressing Goal: Ability to verbalize understanding of medication therapies will improve Outcome: Progressing Goal: Individualized Educational Video(s) Outcome: Progressing   Problem: Cardiac: Goal:  Ability to achieve and maintain adequate cardiopulmonary perfusion will improve Outcome: Progressing   

## 2020-12-20 NOTE — Progress Notes (Signed)
Transitions of Care Pharmacist Note  Daniel Butler is a 55 y.o. male that has been diagnosed with A Fib and will be prescribed Eliquis (apixaban) at discharge.   Patient Education: I provided the following education on apixaban to the patient: How to take the medication Described what the medication is Signs of bleeding Signs/symptoms of VTE and stroke  Answered their questions  Discharge Medications Plan: The patient wants to have their discharge medications filled by the Transitions of Care pharmacy rather than their usual pharmacy.  The discharge orders pharmacy has been changed to the Transitions of Care pharmacy, the patient will receive a phone call regarding co-pay, and their medications will be delivered by the Transitions of Care pharmacy.   Thank you,   Sanda Klein, PharmD, RPh  PGY-1 Pharmacy Resident 12/20/2020 3:28 PM  Please check [email or web address] for unit-specific pharmacy phone numbers.

## 2020-12-20 NOTE — Progress Notes (Signed)
Physical Therapy Treatment Patient Details Name: Daniel Butler MRN: [id number] DOB: 19-Oct-1965 Today's Date: 12/20/2020    History of Present Illness Daniel Butler is a 55 y.o. male presents from home after having a unwitnessed fall due to alcohol intoxication on 12/15/20. Additionally, pt found to have Afib with RVR, AKI, LE edema, and hyperkalemia.  Pt with normal cardiac cath on 12/19/20.  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  Pt reports long standing hx of vertigo (since aneurysm 2016) that has recently gotten worse.  Orthostatic BP was negative today -although pt reports has checked at home and was low at times when dizzy (pt states 60's/40's).  All BPPV testing was negative.  Pt did have some dizziness with head turns and nystagmus with horizontal EOEM.  Vertigo appears to be multifactorial likely including some vestibular hypofunction, potential ETOH induced, dehydration at times, and some reports of hypotension.   Will continue to benefit from PT to advance mobility, balance, and added habituation exercises.    Follow Up Recommendations  Supervision for mobility/OOB;Other (comment) (no coverage for HH per notes - recommend outpt PT (neuro/vestibular preferred))     Equipment Recommendations  None recommended by PT (has DME)    Recommendations for Other Services       Precautions / Restrictions Precautions Precautions: Fall    Mobility  Bed Mobility Overal bed mobility: Needs Assistance Bed Mobility: Sit to Supine       Sit to supine: Supervision        Transfers Overall transfer level: Needs assistance Equipment used: Rolling walker (2 wheeled) Transfers: Sit to/from Stand Sit to Stand: Min assist;From elevated surface         General transfer  comment: Cues for hand placement; exaggerated anterior shift for momentum to stand  Ambulation/Gait Ambulation/Gait assistance: Min assist Gait Distance (Feet): 5 Feet Assistive device: Rolling walker (2 wheeled) Gait Pattern/deviations: Step-to pattern     General Gait Details: Min A for steadying and cues for RW; only wanted to ambulate to bed due to reports of fatigue and Animal nutritionist    Modified Rankin (Stroke Patients Only)       Balance Overall balance assessment: Needs assistance Sitting-balance support: Feet supported Sitting balance-Leahy Scale: Good     Standing balance support: Bilateral upper extremity supported Standing balance-Leahy Scale: Poor Standing balance comment: Requiring RW                            Cognition Arousal/Alertness: Awake/alert Behavior During Therapy: WFL for tasks assessed/performed Overall Cognitive Status: Within Functional Limits for tasks assessed                                        Exercises      General Comments   VESTIBULAR/Dizziness TESTING:  Orthostatic BP:  Today supine 112/83, sit 106/72, stand 114/78 and no c/o dizziness with movement Prior PT note also reports unremarkable Pt does report has tested BP at home and low at times while dizzy (reports 60's/40's) but states he eats a bunch of pork rinds and retest with  BP increasing and dizziness improved.  History of Vertigo: Reports present since aneurysm in 2016 but has gradually worsened.  Described as room spinning, "like on the tilt a whirl;".  States sometimes last 30 sec, 1 hour, or all day.  Usually occurs with head turns or quick movements.  States he has seen ENT in past who offered no answers, reports he thinks they performed BPPV testing.   Also, reports feeling that "feet do not listen to brain." Discussed this is different than vertigo.  He reports this has worsened since he quit  work after aneurysm and started drinking more/less activity.    Testing: [employer]: Negative nystagmus, negative symptoms bilateral Horizontal Roll : Negative nystagmus, negative symptoms Head Turns: + dizziness Gaze Stabilization: intact , negative nystagmus negative symptoms EOEM: Intact, mild dizziness, horizontal nystagmus with lateral side gaze R/L  Educated on PT findings negative for BPPV.  Discussed vestibular hypofunction and vertigo likely multifactoral.  Discussed continued PT plan of care for habituation exercises (educated on these), balance, and mobility training.  Noted HH has been declined by agencies so recommend outpt PT with vestibular/neuro specialist.  Provided with habituation seated HEP. Also, educated on segmental turns for safety with transfers/ambulation.    HEP provide:  Access Code: [email or web address] URL: [email or web address] Date: 12/20/2020 Prepared by: Royetta Asal  Exercises Seated Horizontal Smooth Pursuit - 2 x daily - 7 x weekly - 1 sets - 10 reps Seated Vertical Smooth Pursuit - 2 x daily - 7 x weekly - 1 sets - 10 reps Seated Diagonal Smooth Pursuit - 2 x daily - 7 x weekly - 1 sets - 10 reps Seated Gaze Stabilization with Head Rotation - 2 x daily - 7 x weekly - 1 sets - 10 reps Seated Gaze Stabilization with Head Nod - 2 x daily - 7 x weekly - 1 sets - 10 reps        Pertinent Vitals/Pain Pain Assessment: No/denies pain    Home Living                      Prior Function            PT Goals (current goals can now be found in the care plan section) Acute Rehab PT Goals Patient Stated Goal: Be able to get home and back to independence PT Goal Formulation: With patient/family Time For Goal Achievement: 12/30/20 Potential to Achieve Goals: Good Progress towards PT goals: Progressing toward goals    Frequency    Min 3X/week      PT Plan Discharge plan needs to be updated (no coverage for [facility] per notes - recommend  outpt PT (neuro/vestibular preferred))    Co-evaluation              AM-PAC PT "6 Clicks" Mobility   Outcome Measure  Help needed turning from your back to your side while in a flat bed without using bedrails?: None Help needed moving from lying on your back to sitting on the side of a flat bed without using bedrails?: None Help needed moving to and from a bed to a chair (including a wheelchair)?: A Little Help needed standing up from a chair using your arms (e.g., wheelchair or bedside chair)?: A Little Help needed to walk in hospital room?: A Little Help needed climbing 3-5 steps with a railing? : A Lot 6 Click Score: 19    End of Session Equipment Utilized During Treatment: Gait belt Activity Tolerance: Patient tolerated  treatment well Patient left: in bed;with call bell/phone within reach;with bed alarm set Nurse Communication: Mobility status PT Visit Diagnosis: Unsteadiness on feet (R26.81);Other abnormalities of gait and mobility (R26.89);Difficulty in walking, not elsewhere classified (R26.2);Muscle weakness (generalized) (M62.81);History of falling (Z91.81)     Time: 2956-2130 PT Time Calculation (min) (ACUTE ONLY): 40 min  Charges:  $Therapeutic Activity: 8-22 mins $Neuromuscular Re-education: 8-22 mins $Physical Performance Test: 8-22 mins                     Anise Salvo, PT Acute Rehab Services Pager [phone] [facility] [phone]     Rayetta Humphrey 12/20/2020, 12:05 PM

## 2020-12-20 NOTE — Discharge Instructions (Addendum)
Follow with Primary MD Olive Bass, FNP in 7 days   Get CBC, CMP, 2 view Chest X ray -  checked next visit within 1 week by Primary MD   Activity: As tolerated with Full fall precautions use walker/cane & assistance as needed  Disposition Home    Diet:  Heart Healthy with 1.5 L/day total fluid restriction  Check your Weight same time everyday, if you gain over 2 pounds, or you develop in leg swelling, experience more shortness of breath or chest pain, call your Primary MD immediately. Follow Cardiac Low Salt Diet and 1.5 lit/day fluid restriction.  Special Instructions: If you have smoked or chewed Tobacco  in the last 2 yrs please stop smoking, stop any regular Alcohol  and or any Recreational drug use.  On your next visit with your primary care physician please Get Medicines reviewed and adjusted.  Please request your Prim.MD to go over all Hospital Tests and Procedure/Radiological results at the follow up, please get all Hospital records sent to your Prim MD by signing hospital release before you go home.  If you experience worsening of your admission symptoms, develop shortness of breath, life threatening emergency, suicidal or homicidal thoughts you must seek medical attention immediately by calling 911 or calling your MD immediately  if symptoms less severe.  You Must read complete instructions/literature along with all the possible adverse reactions/side effects for all the Medicines you take and that have been prescribed to you. Take any new Medicines after you have completely understood and accpet all the possible adverse reactions/side effects.     Information on my medicine - ELIQUIS (apixaban)  This medication education was reviewed with me or my healthcare representative as part of my discharge preparation.  Why was Eliquis prescribed for you? Eliquis was prescribed for you to reduce the risk of a blood clot forming that can cause a stroke if you have a medical  condition called atrial fibrillation (a type of irregular heartbeat).  What do You need to know about Eliquis ? Take your Eliquis TWICE DAILY - one tablet in the morning and one tablet in the evening with or without food. If you have difficulty swallowing the tablet whole please discuss with your pharmacist how to take the medication safely.  Take Eliquis exactly as prescribed by your doctor and DO NOT stop taking Eliquis without talking to the doctor who prescribed the medication.  Stopping may increase your risk of developing a stroke.  Refill your prescription before you run out.  After discharge, you should have regular check-up appointments with your healthcare provider that is prescribing your Eliquis.  In the future your dose may need to be changed if your kidney function or weight changes by a significant amount or as you get older.  What do you do if you miss a dose? If you miss a dose, take it as soon as you remember on the same day and resume taking twice daily.  Do not take more than one dose of ELIQUIS at the same time to make up a missed dose.  Important Safety Information A possible side effect of Eliquis is bleeding. You should call your healthcare provider right away if you experience any of the following: ? Bleeding from an injury or your nose that does not stop. ? Unusual colored urine (red or dark brown) or unusual colored stools (red or black). ? Unusual bruising for unknown reasons. ? A serious fall or if you hit your head (even if  there is no bleeding).  Some medicines may interact with Eliquis and might increase your risk of bleeding or clotting while on Eliquis. To help avoid this, consult your healthcare provider or pharmacist prior to using any new prescription or non-prescription medications, including herbals, vitamins, non-steroidal anti-inflammatory drugs (NSAIDs) and supplements.  This website has more information on Eliquis (apixaban):  http://[email or web address].com/eliquis/home

## 2020-12-20 NOTE — Progress Notes (Signed)
Progress Note  Patient Name: Daniel Butler Date of Encounter: 12/20/2020  [facility] Cardiologist: Chrystie Nose, MD   Subjective   Denies any dyspnea or chest pain  Inpatient Medications    Scheduled Meds: . allopurinol  100 mg Oral Daily  . chlordiazePOXIDE  5 mg Oral BID  . fenofibrate  160 mg Oral Daily  . folic acid  1 mg Oral Daily  . icosapent Ethyl  2 g Oral BID  . LORazepam  0-4 mg Intravenous Q12H  . metoprolol tartrate  50 mg Oral BID  . multivitamin with minerals  1 tablet Oral Daily  . rosuvastatin  40 mg Oral Daily  . sodium chloride flush  3 mL Intravenous Q12H  . thiamine  100 mg Oral Daily   Continuous Infusions: . sodium chloride     PRN Meds: sodium chloride, acetaminophen, albuterol, levalbuterol, metoprolol tartrate, ondansetron (ZOFRAN) IV, sodium chloride flush, traZODone   Vital Signs    Vitals:   12/19/20 2342 12/20/20 0341 12/20/20 0700 12/20/20 0759  BP: 120/77 118/82 112/83 112/83  Pulse: 85 82  84  Resp: 19 17  16   Temp: 98 F (36.7 C) 98.4 F (36.9 C)  98 F (36.7 C)  TempSrc: Oral Oral  Oral  SpO2: 93% 92%  93%  Weight:      Height:        Intake/Output Summary (Last 24 hours) at 12/20/2020 0851 Last data filed at 12/20/2020 0350 Gross per 24 hour  Intake --  Output 1250 ml  Net -1250 ml   Last 3 Weights 12/17/2020 12/16/2020 12/15/2020  Weight (lbs) 290 lb 9.1 oz 282 lb 10.1 oz 265 lb  Weight (kg) 131.8 kg 128.2 kg 120.203 kg      Telemetry    AF 80-100s, pause 2.1 seconds overnight- Personally Reviewed  ECG    No new EKG - Personally Reviewed  Physical Exam   GEN: No acute distress.   Neck: No JVD Cardiac: irregular, normal rate, no murmurs, rubs, or gallops.  Respiratory: Expiratory wheezing GI: Soft, nontender MS: trace BLE edema Neuro:  Nonfocal  Psych: Normal affect   Labs    High Sensitivity Troponin:  No results for input(s): TROPONINIHS in the last 720 hours.    Chemistry Recent Labs   Lab 12/18/20 0147 12/19/20 0413 12/19/20 1154 12/20/20 0449  NA 137 137 139 138  K 3.7 3.9 4.1 5.5*  CL 102 102  --  107  CO2 25 27  --  24  GLUCOSE 107* 101*  --  89  BUN 13 15  --  17  CREATININE 1.50* 1.47*  --  1.43*  CALCIUM 8.4* 8.5*  --  8.8*  PROT 5.4* 5.7*  --  5.5*  ALBUMIN 2.7* 2.9*  --  2.8*  AST 26 53*  --  49*  ALT 16 24  --  22  ALKPHOS 24* 29*  --  29*  BILITOT 1.0 1.2  --  1.3*  GFRNONAA 55* 56*  --  58*  ANIONGAP 10 8  --  7     Hematology Recent Labs  Lab 12/17/20 0112 12/18/20 0147 12/19/20 0413 12/19/20 1154  WBC 8.6 10.1 9.3  --   RBC 3.97* 3.97* 4.22  --   HGB 14.3 14.3 15.2 15.0  HCT 42.9 42.4 45.6 44.0  MCV 108.1* 106.8* 108.1*  --   MCH 36.0* 36.0* 36.0*  --   MCHC 33.3 33.7 33.3  --   RDW 15.0  14.6 14.6  --   PLT 196 166 179  --     BNP Recent Labs  Lab 12/18/20 0147 12/19/20 0413 12/20/20 0449  BNP 277.6* 845.8* 511.7*     DDimer  Recent Labs  Lab 12/15/20 1334  DDIMER 0.61*     Radiology    CARDIAC CATHETERIZATION  Result Date: 12/19/2020  There is moderate left ventricular systolic dysfunction.  LV end diastolic pressure is normal.  The left ventricular ejection fraction is 35-45% by visual estimate.  Hemodynamic findings consistent with mild pulmonary hypertension.  1. Normal coronary anatomy 2. Moderate LV dysfunction. EF estimated at 40% 3. Normal LV filling pressures 4. Mild pulmonary HTN. Mean PAP 24 mm Hg 5. Reduced cardiac output. Co ox 63%. Cardiac index 1.89 Plan: medical management. OK to start oral anticoagulation this pm.    Cardiac Studies   Echo 12/18/19: 1. Left ventricular ejection fraction, by estimation, is 30 to 35%. The  left ventricle has moderately decreased function. The left ventricle  demonstrates global hypokinesis. Left ventricular diastolic function could  not be evaluated.  2. Right ventricular systolic function is normal. The right ventricular  size is normal. There is normal  pulmonary artery systolic pressure.  3. Right atrial size was mildly dilated.  4. The mitral valve is normal in structure. No evidence of mitral valve  regurgitation.  5. The aortic valve is normal in structure. Aortic valve regurgitation is  not visualized.   Patient Profile     55 y.o. male  with a hx of hyperlipidemia followed by 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  New diagnosis.  Suspect tachycardia induced from A. fib or EtOH cardiomyopathy.  Normal coronary arteries on cath 4/12.  RHC with RA 11, RV 27/6, PA 32/11/24, PWCP 14, LVEDP 14, CI 1.9, PA sat 63% -Continue metoprolol, will consolidate to toprol XL  -Potassium 5.5 this morning, will hold off on Entresto or spironolactone for now  Atrial fib with RVR: new diagnosis.  CHADSVASc score 2 (CHF, HTN).  Started on metoprolol and IV amiodarone -Has been felt to not be good anticoagulation candidate given issues with falls related to alcohol use, and also h/o SAH with MCA aneurysm s/p clipping in 2016.  I discussed this with neurosurgery, given SAH due to the aneurysm that was clipped, risk of recurrent bleeding is low and not a contraindication to anticoagulation.  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  He and his wife are adamant that he will stay away from alcohol use.  We will start Eliquis 5 mg twice daily.  Plan outpatient follow-up in 3 weeks, if remains in A. fib and doing well with no falls, can schedule for cardioversion at that time -Continue metoprolol 50 mg BID, will consolidate to Toprol-XL 100 mg -Discontinued amiodarone given risk of  chemical cardioversion as has not been on anticoagulation  AKI: Cr 2.1 on admission, now appears stable at 1.4.  Continue to monitor  Hyperkalemia: K 5.5 this morning, treatment per primary team   For questions or updates, please contact [facility] Please consult [email or web address] for contact info under  Signed, Little Ishikawa, MD  12/20/2020, 8:51 AM

## 2020-12-20 NOTE — Progress Notes (Signed)
ANTICOAGULATION CONSULT NOTE - Initial Consult  Pharmacy Consult for apixaban Indication: atrial fibrillation  Allergies  Allergen Reactions  . Zetia [Ezetimibe] Nausea And Vomiting    Patient Measurements: Height: 6\' 2"  (188 cm) Weight: 131.8 kg (290 lb 9.1 oz) IBW/kg (Calculated) : 82.2 Heparin Dosing Weight:   Vital Signs: Temp: 98 F (36.7 C) (04/13 0759) Temp Source: Oral (04/13 0759) BP: 112/83 (04/13 0759) Pulse Rate: 84 (04/13 0759)  Labs: Recent Labs    12/18/20 0147 12/19/20 0413 12/19/20 1154 12/20/20 0449  HGB 14.3 15.2 15.0  --   HCT 42.4 45.6 44.0  --   PLT 166 179  --   --   CREATININE 1.50* 1.47*  --  1.43*    Estimated Creatinine Clearance: 84.2 mL/min (A) (by C-G formula based on SCr of 1.43 mg/dL (H)).   Medical History: Past Medical History:  Diagnosis Date  . Brain aneurysm   . Chicken pox   . Depression   . Gout   . High cholesterol   . Hypertension     Medications:  Medications Prior to Admission  Medication Sig Dispense Refill Last Dose  . allopurinol (ZYLOPRIM) 100 MG tablet Take 1 tablet (100 mg total) by mouth daily. 90 tablet 1 unk at see note  . ALPRAZolam (XANAX) 0.5 MG tablet TAKE 1 TABLET BY MOUTH THREE TIMES A DAY AS NEEDED FOR ANXIETY DUE 11-14-2020 (Patient taking differently: Take 0.5 mg by mouth 3 (three) times daily as needed for anxiety.) 90 tablet 0 unk at see note  . fenofibrate (TRICOR) 145 MG tablet Take 1 tablet (145 mg total) by mouth daily. 90 tablet 1 unk at see note  . ibuprofen (ADVIL) 200 MG tablet Take 200-400 mg by mouth every 6 (six) hours as needed for mild pain (or headaches).   unk  . icosapent Ethyl (VASCEPA) 1 g capsule Take 2 capsules (2 g total) by mouth 2 (two) times daily. 120 capsule 11 unk at see note  . losartan (COZAAR) 100 MG tablet Take 1 tablet (100 mg total) by mouth daily. 90 tablet 1 unk at see note  . rosuvastatin (CRESTOR) 40 MG tablet TAKE 1 TABLET BY MOUTH ONCE DAILY. PATIENT NEEDS OV  FOR FURTHER REFILLS (Patient taking differently: Take 40 mg by mouth daily.) 90 tablet 3 unk at see note   Scheduled:  . allopurinol  100 mg Oral Daily  . chlordiazePOXIDE  5 mg Oral BID  . fenofibrate  160 mg Oral Daily  . folic acid  1 mg Oral Daily  . furosemide  20 mg Intravenous Once  . icosapent Ethyl  2 g Oral BID  . LORazepam  0-4 mg Intravenous Q12H  . metoprolol succinate  100 mg Oral Daily  . multivitamin with minerals  1 tablet Oral Daily  . rosuvastatin  40 mg Oral Daily  . sodium polystyrene  30 g Oral Once  . thiamine  100 mg Oral Daily   Infusions:    Assessment: Pt has been here after a fall due to ETOH abuse. He was placed on moderate dose Lovenox for DVT prophylaxis due to BMI 37. He was dx with new AF. Apixaban has been ordered for anticoagulation.   Scr 1.43 Wt>60kg Age<80 yo  Goal of Therapy:  Monitor platelets by anticoagulation protocol: Yes   Plan:  Dc Lovenox Apixaban 5mg  PO BID Rx will follow peripherally  01-14-2021, PharmD, BCIDP, AAHIVP, CPP Infectious Disease Pharmacist 12/20/2020 9:15 AM

## 2020-12-20 NOTE — Progress Notes (Signed)
Heart Failure Stewardship Pharmacist Progress Note   PCP: Olive Bass, FNP PCP-Cardiologist: Chrystie Nose, MD    HPI:  55 yo M with PMH of HLD, HTN, brain aneurysm, tobacco use, alcohol use, anxiety, depression and gout. He presented to the ED on 12/15/20 after he was found down, intoxicated, in afib with RVR, and hypotensive. Cardiology was consulted and ECHO obtained on 12/17/20. LVEF was reduced to 30-35%. R/LHC completed on 12/19/20 and found to have normal coronary arteries with mildly elevated filling pressures (RA 11, PA 24, wedge 14, CO 4.8, CI 1.9)  Current HF Medications: Furosemide 20 mg IV x 1 Metoprolol XL 100 mg daily  Prior to admission HF Medications: Losartan 100 mg daily  Pertinent Lab Values: . Serum creatinine 1.43, BUN 17, Potassium 5.5 (no hemolysis reported), Sodium 138, BNP 277.6, Magnesium 2.1  Vital Signs: . Weight: 290 lbs (admission weight: 282 lbs) . Blood pressure: 110/80s  . Heart rate: 80-90s  Medication Assistance / Insurance Benefits Check: Does the patient have prescription insurance?  No   Does the patient qualify for medication assistance through manufacturers or grants? Pending - he was seen by CSW at [facility] office and states he won the lottery, may not qualify from financial standpoint  Outpatient Pharmacy:  Prior to admission [facility] Is the patient willing to use [facility] TOC pharmacy at discharge? Yes Is the patient willing to transition their outpatient pharmacy to utilize a [facility] outpatient pharmacy?   Pending    Assessment: 1. Acute on chronic systolic CHF (EF 73-22%), due to NICM. NYHA class II symptoms. Unclear why potassium was elevated today. - Furosemide 20 mg IV x 1 today - Agree with transitioning metoprolol tartrate to metoprolol XL 100 mg daily - Consider starting Entresto 24/26 mg BID for HFrEF once potassium lowers - Consider starting spironolactone 12.5 mg daily once potassium lowers -  Consider starting Farxiga 10 mg daily prior to discharge for HFrEF   Plan: 1) Medication changes recommended at this time: - Add Farxiga 10 mg daily  2) Patient assistance: - Patient is uninsured - may not be able to qualify for patient assistance given financial information. Pt reported to CSW that he prefers to pay cash price for medications rather than pay for insurance - Appreciate assistance from CSW  3)  Education  - To be completed prior to discharge  Sharen Hones, PharmD, BCPS Heart Failure Engineer, building services Phone [phone]

## 2020-12-20 NOTE — TOC Progression Note (Signed)
Transition of Care [facility]) - Progression Note    Patient Details  Name: Daniel Butler MRN: [id number] Date of Birth: [birth year]  Transition of Care [facility]) CM/SW [place], [place] Phone Number: 12/20/2020, 4:35 PM  Clinical Narrative:    CSW met with pt and pt wife bedside and informed them that [place] could not accept pt. OP PT would be next option. They prefer [facility] PT location. CSW made referral for vestibular PT with outpatient Neuro PT clinic.    Expected Discharge Plan: [place] Barriers to Discharge: Continued Medical Work up,Inadequate or no insurance  Expected Discharge Plan and Services Expected Discharge Plan: [place] In-house Referral: Clinical Social Work   Post Acute Care Choice: [place] arrangements for the past 2 months: Single Family Home                                       Social Determinants of Health (SDOH) Interventions    Readmission Risk Interventions No flowsheet data found.

## 2020-12-20 NOTE — Progress Notes (Signed)
PROGRESS NOTE        PATIENT DETAILS Name: Daniel Butler Age: 55 y.o. Sex: male Date of Birth: 1965/09/13 Admit Date: 12/15/2020 Admitting Physician Clydie Braun, MD JWJ:XBJYNW, Allyne Gee, FNP  Brief Narrative: Patient is a 55 y.o. male significant past medical history of alcohol abuse, SAH-s/p craniotomy with clipping in 2016, HTN, HLD, depression/anxiety-who presented to the hospital in the setting of a mechanical fall due to alcohol intoxication.  In the ED-patient was evaluated by trauma services-and found to have no major injuries.  However he was found to have A. fib with RVR, significant lower extremity edema, AKI-started on amiodarone infusion and admitted to the hospitalist service.  See below for further details.  Significant events: 4/8>> fall-alcohol intoxication-brought to ED-found to be in RVR/AKI/lower extremity edema.  Admit to [facility]  Significant studies: 4/8>> CT head: No intracranial abnormalities 4/8>> CT C-spine: No signs of cervical fracture or dislocation 4/8>> CT chest: No acute findings in the chest-nonspecific pulmonary nodules bilaterally 4/8>> CT abdomen/pelvis: No acute injury/acute findings 4/8>> EtOH level: 237 4/8>> acute hepatitis serology: Negative 4/8>> TSH: 2.37 4/10> TTE - 1. Left ventricular ejection fraction, by estimation, is 30 to 35%. The left ventricle has moderately decreased function. The left ventricle demonstrates global hypokinesis. Left ventricular diastolic function could not be evaluated.  2. Right ventricular systolic function is normal. The right ventricular size is normal. There is normal pulmonary artery systolic pressure.  3. Right atrial size was mildly dilated.  4. The mitral valve is normal in structure. No evidence of mitral valve regurgitation.  5. The aortic valve is normal in structure. Aortic valve regurgitation is not visualized  Antimicrobial therapy: None  Microbiology data: 4/8>>  blood culture: Pending 4/8>> urine culture: Pending  Procedures : None  Consults: Trauma, cardiology  DVT Prophylaxis : Place TED hose Start: 12/17/20 0831 SCDs Start: 12/15/20 1557  Prophylactic Lovenox   Subjective:  Patient in bed, appears comfortable, denies any headache, no fever, no chest pain or pressure, no shortness of breath , no abdominal pain. No new focal weakness.   Assessment/Plan:  Mechanical fall: Likely due to alcohol intoxication and use of Xanax-await  PT/OT eval.  Imaging negative for fractures/acute injuries.  Per spouse-has had multiple falls over the past 2 weeks.  A. fib with RVR: Stable TSH, echo noted, cardiology on board.  [place] vas 2 score of at least 1, not a good long-term candidate for anticoagulation given history of alcohol use/falls-and prior SAH.  He was initially on amiodarone now better rate controlled on low-dose beta-blocker.  Newly diagnosed Acute on chronic systolic heart failure EF 30% with hypokinesis.  Will let cardiology opine, currently on beta-blocker, ACE/ARB if renal function improves and blood pressure can tolerate it.  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  Acute toxic metabolic encephalopathy: Due to alcohol intoxication-and now mild DTs, overall stable.  No focal deficits.  AKI: Likely hemodynamically mediated-patient had transient hypotension in the emergency room-furthermore he also appears to be on losartan.  Improving with optimization of fluid status.  Hyperkalemia.  Treated with Kayexalate and Lasix on 12/20/2020.  ?  Dysphagia: Due to encephalopathy, speech on board.  Lactic acidosis: Due to hypoperfusion in the setting of hypotension-no indication of sepsis.  Mild transaminitis: Probably mild alcoholic hepatitis-acute hepatitis serology negative.  No signs of cirrhosis on  imaging.  HLD: Remains on fenofibrate, Vascepa, Crestor - watch LFTs closely.  HTN: BP stable-B Blocker dose adjusted.  Alcohol abuse: Has somewhat of a flushed appearance-but no tremors-is awake/alert-on Ativan per CIWA protocol.  Remains at risk for progression to DTs.  Last drink was 4/8.  Drinks ATLEAST 6 pack of beer daily + 2-3 shots of liquor+daily Xanax use, has been counseled to quit alcohol.  Will gently titrate down Librium.  Bilateral pulmonary nodules: Per radiology - repeat CT chest in 12 months  Debility/deconditioning/multiple falls: PT-OT  Morbid Obesity: Estimated body mass index is 37.31 kg/m as calculated from the following:   Height as of this encounter: 6\' 2"  (1.88 m).   Weight as of this encounter: 131.8 kg.    Diet: Diet Order            Diet Heart Room service appropriate? Yes; Fluid consistency: Thin  Diet effective now                  Code Status:  Full code   Family Communication:  Spouse-[name]- [phone]- over the phone 12/17/20, 12/20/20  Disposition Plan:  Status is: Inpatient  Remains inpatient appropriate because:Inpatient level of care appropriate due to severity of illness   Dispo: The patient is from: Home              Anticipated d/c is to: Home              Patient currently is not medically stable to d/c.   Difficult to place patient No    Barriers to Discharge:   Afib RVR-on amiodarone infusion  Antimicrobial agents:  Anti-infectives (From admission, onward)   Start     Dose/Rate Route Frequency Ordered Stop   12/15/20 1400  vancomycin (VANCOREADY) IVPB 2000 mg/400 mL        2,000 mg 200 mL/hr over 120 Minutes Intravenous  Once 12/15/20 1358 12/15/20 1621   12/15/20 1400  ceFEPIme (MAXIPIME) 2 g in sodium chloride 0.9 % 100 mL IVPB        2 g 200 mL/hr over 30 Minutes Intravenous  Once 12/15/20 1358 12/15/20 1447       Time spent: 35 minutes-Greater than 50% of this time was spent in counseling, explanation of  diagnosis, planning of further management, and coordination of care.  MEDICATIONS: Scheduled Meds: . allopurinol  100 mg Oral Daily  . chlordiazePOXIDE  5 mg Oral BID  . fenofibrate  160 mg Oral Daily  . folic acid  1 mg Oral Daily  . furosemide  20 mg Intravenous Once  . icosapent Ethyl  2 g Oral BID  . LORazepam  0-4 mg Intravenous Q12H  . metoprolol succinate  100 mg Oral Daily  . multivitamin with minerals  1 tablet Oral Daily  . rosuvastatin  40 mg Oral Daily  . sodium polystyrene  30 g Oral Once  . thiamine  100 mg Oral Daily   Continuous Infusions:  PRN Meds:.acetaminophen, albuterol, levalbuterol, metoprolol tartrate, ondansetron (ZOFRAN) IV, traZODone   PHYSICAL EXAM: Vital signs: Vitals:   12/19/20 2342 12/20/20 0341 12/20/20 0700 12/20/20 0759  BP: 120/77 118/82 112/83 112/83  Pulse: 85 82  84  Resp: 19 17  16   Temp: 98 F (36.7 C) 98.4 F (36.9 C)  98 F (36.7 C)  TempSrc: Oral Oral  Oral  SpO2: 93% 92%  93%  Weight:  Height:       Filed Weights   12/15/20 1307 12/16/20 0322 12/17/20 0336  Weight: 120.2 kg 128.2 kg 131.8 kg   Body mass index is 37.31 kg/m.   Gen Exam:  Awake Alert, No new F.N deficits,   [place].AT,PERRAL Supple Neck,No JVD, No cervical lymphadenopathy appriciated.  Symmetrical Chest wall movement, Good air movement bilaterally, few rales RRR,No Gallops, Rubs or new Murmurs, No Parasternal Heave +ve B.Sounds, Abd Soft, No tenderness, No organomegaly appriciated, No rebound - guarding or rigidity. No Cyanosis, TEDs   I have personally reviewed following labs and imaging studies  LABORATORY DATA: CBC: Recent Labs  Lab 12/15/20 1334 12/16/20 0136 12/17/20 0112 12/18/20 0147 12/19/20 0413 12/19/20 1154  WBC 6.6 7.2 8.6 10.1 9.3  --   NEUTROABS 3.8  --   --  7.6 6.8  --   HGB 14.0 15.4 14.3 14.3 15.2 15.0  HCT 42.9 47.5 42.9 42.4 45.6 44.0  MCV 111.1* 109.7* 108.1* 106.8* 108.1*  --   PLT 232 227 196 166 179  --      Basic Metabolic Panel: Recent Labs  Lab 12/15/20 1552 12/16/20 0136 12/17/20 0112 12/18/20 0147 12/19/20 0413 12/19/20 1154 12/20/20 0449  NA  --  147* 140 137 137 139 138  K  --  4.6 3.9 3.7 3.9 4.1 5.5*  CL  --  115* 107 102 102  --  107  CO2  --  19* 26 25 27   --  24  GLUCOSE  --  90 98 107* 101*  --  89  BUN  --  18 17 13 15   --  17  CREATININE  --  1.75* 1.66* 1.50* 1.47*  --  1.43*  CALCIUM  --  8.9 8.3* 8.4* 8.5*  --  8.8*  MG 2.2  --   --  1.7 1.9  --  2.1  PHOS 3.3  --   --   --   --   --   --     GFR: Estimated Creatinine Clearance: 84.2 mL/min (A) (by C-G formula based on SCr of 1.43 mg/dL (H)).  Liver Function Tests: Recent Labs  Lab 12/16/20 0136 12/17/20 0112 12/18/20 0147 12/19/20 0413 12/20/20 0449  AST 57* 35 26 53* 49*  ALT 24 21 16 24 22   ALKPHOS 27* 28* 24* 29* 29*  BILITOT 1.1 1.3* 1.0 1.2 1.3*  PROT 6.3* 5.7* 5.4* 5.7* 5.5*  ALBUMIN 3.4* 3.1* 2.7* 2.9* 2.8*   No results for input(s): LIPASE, AMYLASE in the last 168 hours. Recent Labs  Lab 12/15/20 1334  AMMONIA 33    Coagulation Profile: Recent Labs  Lab 12/15/20 1334  INR 1.2    Cardiac Enzymes: Recent Labs  Lab 12/15/20 1552  CKTOTAL 377    BNP (last 3 results) No results for input(s): PROBNP in the last 8760 hours.  Lipid Profile: No results for input(s): CHOL, HDL, LDLCALC, TRIG, CHOLHDL, LDLDIRECT in the last 72 hours.  Thyroid Function Tests: No results for input(s): TSH, T4TOTAL, FREET4, T3FREE, THYROIDAB in the last 72 hours.  Anemia Panel: No results for input(s): VITAMINB12, FOLATE, FERRITIN, TIBC, IRON, RETICCTPCT in the last 72 hours.  Urine analysis:    Component Value Date/Time   COLORURINE YELLOW 12/15/2020 1422   APPEARANCEUR CLEAR 12/15/2020 1422   LABSPEC 1.006 12/15/2020 1422   PHURINE 6.0 12/15/2020 1422   GLUCOSEU NEGATIVE 12/15/2020 1422   HGBUR NEGATIVE 12/15/2020 1422   BILIRUBINUR NEGATIVE 12/15/2020 1422   KETONESUR NEGATIVE  12/15/2020 1422   PROTEINUR NEGATIVE 12/15/2020 1422   UROBILINOGEN 0.2 03/02/2015 1337   NITRITE NEGATIVE 12/15/2020 1422   LEUKOCYTESUR NEGATIVE 12/15/2020 1422    Sepsis Labs: Lactic Acid, Venous    Component Value Date/Time   LATICACIDVEN 3.1 (HH) 12/15/2020 1552    MICROBIOLOGY: Recent Results (from the past 240 hour(s))  Blood culture (routine single)     Status: None   Collection Time: 12/15/20  1:20 PM   Specimen: BLOOD  Result Value Ref Range Status   Specimen Description BLOOD SITE NOT SPECIFIED  Final   Special Requests   Final    BOTTLES DRAWN AEROBIC AND ANAEROBIC Blood Culture results may not be optimal due to an inadequate volume of blood received in culture bottles   Culture   Final    NO GROWTH 5 DAYS Performed at [facility], [address] [place], [place] [address]    Report Status 12/20/2020 FINAL  Final  Urine culture     Status: None   Collection Time: 12/15/20  1:20 PM   Specimen: In/Out Cath Urine  Result Value Ref Range Status   Specimen Description IN/OUT CATH URINE  Final   Special Requests NONE  Final   Culture   Final    NO GROWTH Performed at [facility], [address] [place], [place] [address]    Report Status 12/16/2020 FINAL  Final  Resp Panel by RT-PCR (Flu A&B, Covid) Nasopharyngeal Swab     Status: None   Collection Time: 12/15/20  2:17 PM   Specimen: Nasopharyngeal Swab; Nasopharyngeal(NP) swabs in vial transport medium  Result Value Ref Range Status   SARS Coronavirus 2 by RT PCR NEGATIVE NEGATIVE Final    Comment: (NOTE) SARS-CoV-2 target nucleic acids are NOT DETECTED.  The SARS-CoV-2 RNA is generally detectable in upper respiratory specimens during the acute phase of infection. The lowest concentration of SARS-CoV-2 viral copies this assay can detect is 138 copies/mL. A negative result does not preclude SARS-Cov-2 infection and should not be used as the sole basis for treatment or other patient management  decisions. A negative result may occur with  improper specimen collection/handling, submission of specimen other than nasopharyngeal swab, presence of viral mutation(s) within the areas targeted by this assay, and inadequate number of viral copies(<138 copies/mL). A negative result must be combined with clinical observations, patient history, and epidemiological information. The expected result is Negative.  Fact Sheet for Patients:  BloggerCourse.comhttps://www.fda.gov/media/152166/download  Fact Sheet for Healthcare Providers:  SeriousBroker.ithttps://www.fda.gov/media/152162/download  This test is no t yet approved or cleared by the [place] FDA and  has been authorized for detection and/or diagnosis of SARS-CoV-2 by FDA under an Emergency Use Authorization (EUA). This EUA will remain  in effect (meaning this test can be used) for the duration of the COVID-19 declaration under Section 564(b)(1) of the Act, 21 U.S.C.section 360bbb-3(b)(1), unless the authorization is terminated  or revoked sooner.       Influenza A by PCR NEGATIVE NEGATIVE Final   Influenza B by PCR NEGATIVE NEGATIVE Final    Comment: (NOTE) The Xpert Xpress SARS-CoV-2/FLU/RSV plus assay is intended as an aid in the diagnosis of influenza from Nasopharyngeal swab specimens and should not be used as a sole basis for treatment. Nasal washings and aspirates are unacceptable for Xpert Xpress SARS-CoV-2/FLU/RSV testing.  Fact Sheet for Patients: BloggerCourse.comhttps://www.fda.gov/media/152166/download  Fact Sheet for Healthcare Providers: SeriousBroker.ithttps://www.fda.gov/media/152162/download  This test is not yet approved or cleared by the [place] FDA  and has been authorized for detection and/or diagnosis of SARS-CoV-2 by FDA under an Emergency Use Authorization (EUA). This EUA will remain in effect (meaning this test can be used) for the duration of the COVID-19 declaration under Section 564(b)(1) of the Act, 21 U.S.C. section 360bbb-3(b)(1), unless the  authorization is terminated or revoked.  Performed at [facility], [address] [place], [place] [address]     RADIOLOGY STUDIES/RESULTS: CARDIAC CATHETERIZATION  Result Date: 12/19/2020  There is moderate left ventricular systolic dysfunction.  LV end diastolic pressure is normal.  The left ventricular ejection fraction is 35-45% by visual estimate.  Hemodynamic findings consistent with mild pulmonary hypertension.  1. Normal coronary anatomy 2. Moderate LV dysfunction. EF estimated at 40% 3. Normal LV filling pressures 4. Mild pulmonary HTN. Mean PAP 24 mm Hg 5. Reduced cardiac output. Co ox 63%. Cardiac index 1.89 Plan: medical management. OK to start oral anticoagulation this pm.     LOS: 5 days   Susa Raring, MD  [facility]  12/20/2020, 9:03 AM

## 2020-12-21 ENCOUNTER — Encounter (HOSPITAL_COMMUNITY): Payer: Self-pay | Admitting: Internal Medicine

## 2020-12-21 ENCOUNTER — Inpatient Hospital Stay (HOSPITAL_COMMUNITY): Payer: Self-pay

## 2020-12-21 LAB — COMPREHENSIVE METABOLIC PANEL
ALT: 26 U/L (ref 0–44)
AST: 41 U/L (ref 15–41)
Albumin: 2.8 g/dL — ABNORMAL LOW (ref 3.5–5.0)
Alkaline Phosphatase: 27 U/L — ABNORMAL LOW (ref 38–126)
Anion gap: 8 (ref 5–15)
BUN: 17 mg/dL (ref 6–20)
CO2: 27 mmol/L (ref 22–32)
Calcium: 8.5 mg/dL — ABNORMAL LOW (ref 8.9–10.3)
Chloride: 101 mmol/L (ref 98–111)
Creatinine, Ser: 1.52 mg/dL — ABNORMAL HIGH (ref 0.61–1.24)
GFR, Estimated: 54 mL/min — ABNORMAL LOW (ref >60)
Glucose, Bld: 104 mg/dL — ABNORMAL HIGH (ref 70–99)
Potassium: 3.4 mmol/L — ABNORMAL LOW (ref 3.5–5.1)
Sodium: 136 mmol/L (ref 135–145)
Total Bilirubin: 0.8 mg/dL (ref 0.3–1.2)
Total Protein: 5.6 g/dL — ABNORMAL LOW (ref 6.5–8.1)

## 2020-12-21 LAB — CBC WITH DIFFERENTIAL/PLATELET
Abs Immature Granulocytes: 0.04 10*3/uL (ref 0.00–0.07)
Basophils Absolute: 0 10*3/uL (ref 0.0–0.1)
Basophils Relative: 1 %
Eosinophils Absolute: 0.2 10*3/uL (ref 0.0–0.5)
Eosinophils Relative: 2 %
HCT: 43.6 % (ref 39.0–52.0)
Hemoglobin: 14.7 g/dL (ref 13.0–17.0)
Immature Granulocytes: 1 %
Lymphocytes Relative: 23 %
Lymphs Abs: 1.8 10*3/uL (ref 0.7–4.0)
MCH: 35.7 pg — ABNORMAL HIGH (ref 26.0–34.0)
MCHC: 33.7 g/dL (ref 30.0–36.0)
MCV: 105.8 fL — ABNORMAL HIGH (ref 80.0–100.0)
Monocytes Absolute: 0.5 10*3/uL (ref 0.1–1.0)
Monocytes Relative: 7 %
Neutro Abs: 5.3 10*3/uL (ref 1.7–7.7)
Neutrophils Relative %: 66 %
Platelets: 233 10*3/uL (ref 150–400)
RBC: 4.12 MIL/uL — ABNORMAL LOW (ref 4.22–5.81)
RDW: 14.3 % (ref 11.5–15.5)
WBC: 7.9 10*3/uL (ref 4.0–10.5)
nRBC: 0 % (ref 0.0–0.2)

## 2020-12-21 LAB — MAGNESIUM: Magnesium: 2 mg/dL (ref 1.7–2.4)

## 2020-12-21 LAB — BRAIN NATRIURETIC PEPTIDE: B Natriuretic Peptide: 411.2 pg/mL — ABNORMAL HIGH (ref 0.0–100.0)

## 2020-12-21 MED ORDER — POTASSIUM CHLORIDE CRYS ER 20 MEQ PO TBCR
40.0000 meq | EXTENDED_RELEASE_TABLET | Freq: Once | ORAL | Status: AC
  Administered 2020-12-21: 40 meq via ORAL
  Filled 2020-12-21: qty 2

## 2020-12-21 MED ORDER — HALOPERIDOL LACTATE 5 MG/ML IJ SOLN: 5.0000 mg | Freq: Four times a day (QID) | INTRAMUSCULAR | Status: DC | PRN

## 2020-12-21 MED ORDER — CHLORDIAZEPOXIDE HCL 5 MG PO CAPS
5.0000 mg | ORAL_CAPSULE | Freq: Every day | ORAL | Status: DC
  Administered 2020-12-22 – 2020-12-23 (×2): 5 mg via ORAL
  Filled 2020-12-21 (×2): qty 1

## 2020-12-21 MED ORDER — SACUBITRIL-VALSARTAN 24-26 MG PO TABS
1.0000 | ORAL_TABLET | Freq: Two times a day (BID) | ORAL | Status: DC
  Administered 2020-12-21 – 2020-12-23 (×5): 1 via ORAL
  Filled 2020-12-21 (×5): qty 1

## 2020-12-21 MED ORDER — LORAZEPAM 2 MG/ML IJ SOLN: 1.0000 mg | INTRAMUSCULAR | Status: DC | PRN

## 2020-12-21 MED ORDER — LORAZEPAM 2 MG/ML IJ SOLN
0.0000 mg | Freq: Two times a day (BID) | INTRAMUSCULAR | Status: DC
Start: 2020-12-23 — End: 2020-12-23

## 2020-12-21 MED ORDER — LORAZEPAM 1 MG PO TABS
1.0000 mg | ORAL_TABLET | ORAL | Status: DC | PRN
Start: 2020-12-21 — End: 2020-12-23

## 2020-12-21 MED ORDER — LORAZEPAM 2 MG/ML IJ SOLN
0.0000 mg | Freq: Four times a day (QID) | INTRAMUSCULAR | Status: AC
Start: 2020-12-21 — End: 2020-12-23

## 2020-12-21 NOTE — Plan of Care (Signed)
  Problem: Activity: Goal: Capacity to carry out activities will improve Outcome: Progressing   Problem: Education: Goal: Knowledge of General Education information will improve Description: Including pain rating scale, medication(s)/side effects and non-pharmacologic comfort measures Outcome: Progressing   Problem: Health Behavior/Discharge Planning: Goal: Ability to manage health-related needs will improve Outcome: Progressing   Problem: Clinical Measurements: Goal: Ability to maintain clinical measurements within normal limits will improve Outcome: Progressing Goal: Will remain free from infection Outcome: Progressing Goal: Diagnostic test results will improve Outcome: Progressing Goal: Respiratory complications will improve Outcome: Progressing Goal: Cardiovascular complication will be avoided Outcome: Progressing   Problem: Activity: Goal: Risk for activity intolerance will decrease Outcome: Progressing   Problem: Nutrition: Goal: Adequate nutrition will be maintained Outcome: Progressing   Problem: Coping: Goal: Level of anxiety will decrease Outcome: Progressing   Problem: Elimination: Goal: Will not experience complications related to bowel motility Outcome: Progressing Goal: Will not experience complications related to urinary retention Outcome: Progressing   Problem: Pain Managment: Goal: General experience of comfort will improve Outcome: Progressing   Problem: Safety: Goal: Ability to remain free from injury will improve Outcome: Progressing   Problem: Skin Integrity: Goal: Risk for impaired skin integrity will decrease Outcome: Progressing   Problem: Education: Goal: Ability to demonstrate management of disease process will improve Outcome: Progressing Goal: Ability to verbalize understanding of medication therapies will improve Outcome: Progressing Goal: Individualized Educational Video(s) Outcome: Progressing   Problem: Cardiac: Goal:  Ability to achieve and maintain adequate cardiopulmonary perfusion will improve Outcome: Progressing   

## 2020-12-21 NOTE — Progress Notes (Signed)
Occupational Therapy Treatment Patient Details Name: Daniel Butler MRN: [id number] DOB: 10-11-1965 Today's Date: 12/21/2020    History of present illness Daniel Butler is a 55 y.o. male presents from home after having a unwitnessed fall due to alcohol intoxication on 12/15/20. Additionally, pt found to have Afib with RVR, AKI, LE edema, and hyperkalemia.  Pt with normal cardiac cath on 12/19/20.  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 Pt received in bed with his wife present. He currently requires minimal assistance to stand from an elevated surface, minA for mobility in the room and minA for grooming while standing at sink level. Pt limited secondary to decreased activity tolerance, cardiopulmonary limitations, generalized weakness and pain in right knee. HR 122bpm with standing at sink level, SpO2 >90% on RA throughout ADL and mobility. Educated pt on energy conservation strategies, importance of mobility and importance of activity progression. Pt will continue to benefit from skilled OT services to maximize safety and independence with ADL/IADL and functional mobility. Will continue to follow acutely and progress as tolerated.    Follow Up Recommendations  Home health OT;Supervision/Assistance - 24 hour (initially)    Equipment Recommendations  3 in 1 bedside commode;Wheelchair (measurements OT);Wheelchair cushion (measurements OT)    Recommendations for Other Services      Precautions / Restrictions Precautions Precautions: Fall Precaution Comments: Dizziness with upright activity, no appreciable BP drop Restrictions Weight Bearing Restrictions: No       Mobility Bed Mobility Overal bed mobility: Needs Assistance Bed Mobility: Supine to Sit     Supine to sit: Supervision     General bed  mobility comments: supervision due to dizziness    Transfers Overall transfer level: Needs assistance Equipment used: Rolling walker (2 wheeled) Transfers: Sit to/from Stand Sit to Stand: Min assist;From elevated surface         General transfer comment: Cues for hand placement; exaggerated anterior shift for momentum to stand    Balance Overall balance assessment: Needs assistance Sitting-balance support: Feet supported Sitting balance-Leahy Scale: Good Sitting balance - Comments: reports dizziness with initial sitting up, got better as he sat longer.   Standing balance support: Bilateral upper extremity supported Standing balance-Leahy Scale: Poor Standing balance comment: Requiring RW                           ADL either performed or assessed with clinical judgement   ADL Overall ADL's : Needs assistance/impaired Eating/Feeding: Set up;Sitting   Grooming: Minimal assistance;Standing;Oral care Grooming Details (indicate cue type and reason): at sink level         Upper Body Dressing : Minimal assistance;Sitting   Lower Body Dressing: Moderate assistance;Sitting/lateral leans;Sit to/from stand;Cueing for safety   Toilet Transfer: Minimal assistance;Ambulation;RW Toilet Transfer Details (indicate cue type and reason): simulated in room         Functional mobility during ADLs: Minimal assistance;Rolling walker;Cueing for safety General ADL Comments: Pt limited due to decreased strength, decreased activity tolerance and decreased abilityto perform ADL. HR 122bpm with ADL at sink level. educated pt and wife on energy conservation strategies and activity progression     Vision   Vision Assessment?: No apparent visual deficits   Perception     Praxis      Cognition Arousal/Alertness: Awake/alert Behavior During Therapy: WFL for tasks assessed/performed  Overall Cognitive Status: Within Functional Limits for tasks assessed Area of Impairment:  Safety/judgement                         Safety/Judgement: Decreased awareness of safety;Decreased awareness of deficits     General Comments: decreased awareness of significance of dizziness and safety, cues for safe hand placement. pt with short attention span        Exercises Exercises: Other exercises Other Exercises Other Exercises: pt completed 5x incentive spirometer pulling 1500 consistently   Shoulder Instructions       General Comments pt's spouse in the room throughout the session, very supportive and verbalized understanding of all education done this session    Pertinent Vitals/ Pain       Pain Assessment: Faces Faces Pain Scale: Hurts a little bit Pain Location: Grimace with movement, but also R meniscus injury Pain Descriptors / Indicators: Discomfort;Grimacing Pain Intervention(s): Limited activity within patient's tolerance;Monitored during session  Home Living                                          Prior Functioning/Environment              Frequency  Min 2X/week        Progress Toward Goals  OT Goals(current goals can now be found in the care plan section)  Progress towards OT goals: Progressing toward goals  Acute Rehab OT Goals Patient Stated Goal: Be able to get home and back to independence OT Goal Formulation: With patient/family Time For Goal Achievement: 01/01/21 Potential to Achieve Goals: Good ADL Goals Pt Will Perform Grooming: with min guard assist;standing Pt Will Perform Lower Body Dressing: with min guard assist;sit to/from stand;sitting/lateral leans Pt Will Transfer to Toilet: with min guard assist;ambulating;regular height toilet Pt Will Perform Toileting - Clothing Manipulation and hygiene: with min guard assist;sitting/lateral leans;sit to/from stand Pt/caregiver will Perform Home Exercise Program: Increased strength;Increased ROM;Right Upper extremity;With theraband;With written HEP  provided Additional ADL Goal #1: Pt will increase to x5 mins of standing OOB ADL tasks with 1 seated rest break and O2 >90%.  Plan Discharge plan remains appropriate    Co-evaluation                 AM-PAC OT "6 Clicks" Daily Activity     Outcome Measure   Help from another person eating meals?: A Little Help from another person taking care of personal grooming?: A Little Help from another person toileting, which includes using toliet, bedpan, or urinal?: A Little Help from another person bathing (including washing, rinsing, drying)?: A Lot Help from another person to put on and taking off regular upper body clothing?: A Little Help from another person to put on and taking off regular lower body clothing?: A Lot 6 Click Score: 16    End of Session Equipment Utilized During Treatment: Rolling walker;Gait belt  OT Visit Diagnosis: Unsteadiness on feet (R26.81);Muscle weakness (generalized) (M62.81);Pain;Other symptoms and signs involving cognitive function Pain - Right/Left: Right Pain - part of body: Knee   Activity Tolerance Patient limited by fatigue;Patient tolerated treatment well   Patient Left in chair;with call bell/phone within reach;with chair alarm set;with family/visitor present   Nurse Communication Mobility status        Time: 3825-0539 OT Time Calculation (min): 33 min  Charges: OT General Charges $OT Visit: 1  Visit OT Treatments $Self Care/Home Management : 23-37 mins  Rosey Bath OTR/L Acute Rehabilitation Services Office: [phone]    Rebeca Alert 12/21/2020, 12:40 PM

## 2020-12-21 NOTE — Progress Notes (Signed)
Heart Failure Nurse Navigator Progress Note  After completing screening, pt qualifies for HV TOC follow up. Explained how the short term clinic works. Pt declined, stated he would follow up with Edd Fabian, NP 5/9.   Navigator reiterated importance of alcohol cessation and smoking cessation. Pt states he smokes 2PPD and drinks upwards of 35 beers per week and 35 shots of hard liquor ([place] and [place]). Pt states he is "independently wealthy" and declined assistance with insurance as he "knows I do not qualify for medicaid due to his income". Pt states he does NOT drink and drive, but does have 2 vehicles and is able to get to next appointments.   Pt verbalized understanding for alcohol and smoking cessation.   Education Assessment and Provision:  Detailed education and instructions provided on heart failure disease management including the following:  Signs and symptoms of Heart Failure When to call the physician Importance of daily weights Low sodium diet Fluid restriction Medication management Anticipated future follow-up appointments  Patient education given on each of the above topics.  Patient acknowledges understanding via teach back method and acceptance of all instructions.  Education Materials:  "Living Better With Heart Failure" Booklet, HF zone tool, & Daily Weight Tracker Tool.  Patient has scale at home: yes Patient has pill box at home: declined  Ozella Rocks, RN, BSN Heart Failure Nurse Navigator [phone]

## 2020-12-21 NOTE — Progress Notes (Signed)
   12/21/20 1010  Clinical Encounter Type  Visited With Patient and family together  Visit Type Initial  Referral From Nurse  Consult/Referral To Chaplain  Chaplain responded to [facility] request. The patient's wife was at bedside.  This chaplain prayed with them together. Chaplain engaged in active listening as the patient spoke about hoping to regain his strength and provided emotional and spiritual to Mrs. Colston as well. This note was prepared by Deneen Harts, M.Div..  For questions please contact by phone [phone].

## 2020-12-21 NOTE — Progress Notes (Signed)
Progress Note  Patient Name: Daniel Butler Date of Encounter: 12/21/2020  [facility] Cardiologist: Chrystie Nose, MD   Subjective   Denies any dyspnea or chest pain  Inpatient Medications    Scheduled Meds: . allopurinol  100 mg Oral Daily  . apixaban  5 mg Oral BID  . [START ON 12/22/2020] chlordiazePOXIDE  5 mg Oral Q0600  . dapagliflozin propanediol  10 mg Oral Daily  . fenofibrate  160 mg Oral Daily  . folic acid  1 mg Oral Daily  . icosapent Ethyl  2 g Oral BID  . metoprolol succinate  100 mg Oral Daily  . multivitamin with minerals  1 tablet Oral Daily  . rosuvastatin  40 mg Oral Daily  . sacubitril-valsartan  1 tablet Oral BID  . thiamine  100 mg Oral Daily   Continuous Infusions:  PRN Meds: acetaminophen, albuterol, levalbuterol, metoprolol tartrate, ondansetron (ZOFRAN) IV, traZODone   Vital Signs    Vitals:   12/20/20 2000 12/21/20 0008 12/21/20 0552 12/21/20 0802  BP: 108/81 110/72 118/65 125/73  Pulse: 86 70 94 83  Resp: 17 17 15 16   Temp: (!) 97.5 F (36.4 C) 98.4 F (36.9 C) 98.2 F (36.8 C) 97.9 F (36.6 C)  TempSrc: Oral Oral Oral Oral  SpO2: 98% 96% 95% 95%  Weight:   131.2 kg   Height:        Intake/Output Summary (Last 24 hours) at 12/21/2020 0828 Last data filed at 12/21/2020 0554 Gross per 24 hour  Intake 780 ml  Output 3276 ml  Net -2496 ml   Last 3 Weights 12/21/2020 12/17/2020 12/16/2020  Weight (lbs) 289 lb 3.9 oz 290 lb 9.1 oz 282 lb 10.1 oz  Weight (kg) 131.2 kg 131.8 kg 128.2 kg      Telemetry    AF 80-100s, no pauses- Personally Reviewed  ECG    No new EKG - Personally Reviewed  Physical Exam   GEN: No acute distress.   Neck: No JVD Cardiac: irregular, normal rate, no murmurs, rubs, or gallops.  Respiratory: CTAB GI: Soft, nontender MS: trace BLE edema Neuro:  Nonfocal  Psych: Normal affect   Labs    High Sensitivity Troponin:  No results for input(s): TROPONINIHS in the last 720 hours.     Chemistry Recent Labs  Lab 12/19/20 0413 12/19/20 1154 12/20/20 0449 12/21/20 0206  NA 137 139 138 136  K 3.9 4.1 5.5* 3.4*  CL 102  --  107 101  CO2 27  --  24 27  GLUCOSE 101*  --  89 104*  BUN 15  --  17 17  CREATININE 1.47*  --  1.43* 1.52*  CALCIUM 8.5*  --  8.8* 8.5*  PROT 5.7*  --  5.5* 5.6*  ALBUMIN 2.9*  --  2.8* 2.8*  AST 53*  --  49* 41  ALT 24  --  22 26  ALKPHOS 29*  --  29* 27*  BILITOT 1.2  --  1.3* 0.8  GFRNONAA 56*  --  58* 54*  ANIONGAP 8  --  7 8     Hematology Recent Labs  Lab 12/19/20 0413 12/19/20 1154 12/20/20 1029 12/21/20 0206  WBC 9.3  --  8.9 7.9  RBC 4.22  --  4.78 4.12*  HGB 15.2 15.0 16.8 14.7  HCT 45.6 44.0 50.5 43.6  MCV 108.1*  --  105.6* 105.8*  MCH 36.0*  --  35.1* 35.7*  MCHC 33.3  --  33.3  33.7  RDW 14.6  --  14.3 14.3  PLT 179  --  PLATELET CLUMPS NOTED ON SMEAR, UNABLE TO ESTIMATE 233    BNP Recent Labs  Lab 12/19/20 0413 12/20/20 0449 12/21/20 0206  BNP 845.8* 511.7* 411.2*     DDimer  Recent Labs  Lab 12/15/20 1334  DDIMER 0.61*     Radiology    CARDIAC CATHETERIZATION  Result Date: 12/19/2020  There is moderate left ventricular systolic dysfunction.  LV end diastolic pressure is normal.  The left ventricular ejection fraction is 35-45% by visual estimate.  Hemodynamic findings consistent with mild pulmonary hypertension.  1. Normal coronary anatomy 2. Moderate LV dysfunction. EF estimated at 40% 3. Normal LV filling pressures 4. Mild pulmonary HTN. Mean PAP 24 mm Hg 5. Reduced cardiac output. Co ox 63%. Cardiac index 1.89 Plan: medical management. OK to start oral anticoagulation this pm.   DG Chest Port 1 View  Result Date: 12/21/2020 CLINICAL DATA:  Dyspnea EXAM: PORTABLE CHEST 1 VIEW COMPARISON:  12/15/2020 FINDINGS: Lungs are clear. No pneumothorax or pleural effusion. Mild to moderate cardiomegaly is stable. Pulmonary vascularity is normal. No acute bone abnormality. IMPRESSION: Stable  cardiomegaly. Electronically Signed   By: Helyn Numbers MD   On: 12/21/2020 07:35    Cardiac Studies   Echo 12/18/19: 1. Left ventricular ejection fraction, by estimation, is 30 to 35%. The  left ventricle has moderately decreased function. The left ventricle  demonstrates global hypokinesis. Left ventricular diastolic function could  not be evaluated.  2. Right ventricular systolic function is normal. The right ventricular  size is normal. There is normal pulmonary artery systolic pressure.  3. Right atrial size was mildly dilated.  4. The mitral valve is normal in structure. No evidence of mitral valve  regurgitation.  5. The aortic valve is normal in structure. Aortic valve regurgitation is  not visualized.   Patient Profile     55 y.o. male  with a hx of hyperlipidemia followed by 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  New diagnosis.  Suspect tachycardia induced from A. fib or EtOH cardiomyopathy.  Normal coronary arteries on cath 4/12.  RHC with RA 11, RV 27/6, PA 32/11/24, PWCP 14, LVEDP 14, CI 1.9, PA sat 63% -Continue metoprolol, consolidate today to toprol XL 100 mg daily -Continue Farxiga 10 mg daily -Start entresto 24-26 mg BID -Hold off on spironolactone for now given hyperkalemia yesterday.  Will monitor K+ with starting entresto, can plan to add spironolactone as outpatient.  Atrial fib with RVR: new diagnosis.  CHADSVASc score 2 (CHF, HTN).  Started on metoprolol and IV amiodarone -Has been felt to not be good anticoagulation candidate given issues with falls related to alcohol use, and also h/o SAH with MCA aneurysm s/p clipping in 2016.  I discussed this with neurosurgery, given SAH due to the aneurysm that was clipped, risk of recurrent bleeding is low and not a  contraindication to anticoagulation.  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  He and his wife are adamant that he will stay away from alcohol use.  We will start Eliquis 5  mg twice daily.  Plan outpatient follow-up in 3 weeks, if remains in A. fib and doing well with no falls, can schedule for cardioversion at that time -Continue Toprol-XL 100 mg  AKI: Cr 2.1 on admission, now appears stable at 1.4-1.5.  Continue to monitor    For questions or updates, please contact [facility] Please consult [email or web address] for contact info under        Signed, Little Ishikawa, MD  12/21/2020, 8:28 AM

## 2020-12-21 NOTE — Progress Notes (Signed)
PROGRESS NOTE        PATIENT DETAILS Name: Daniel Butler Age: 55 y.o. Sex: male Date of Birth: 1966-03-17 Admit Date: 12/15/2020 Admitting Physician Clydie Braun, MD KPT:WSFKCL, Allyne Gee, FNP  Brief Narrative: Patient is a 54 y.o. male significant past medical history of alcohol abuse, SAH-s/p craniotomy with clipping in 2016, HTN, HLD, depression/anxiety-who presented to the hospital in the setting of a mechanical fall due to alcohol intoxication.  In the ED-patient was evaluated by trauma services-and found to have no major injuries.  However he was found to have A. fib with RVR, significant lower extremity edema, AKI-started on amiodarone infusion and admitted to the hospitalist service.  See below for further details.  Significant events: 4/8>> fall-alcohol intoxication-brought to ED-found to be in RVR/AKI/lower extremity edema.  Admit to [facility]  Significant studies: 4/8>> CT head: No intracranial abnormalities 4/8>> CT C-spine: No signs of cervical fracture or dislocation 4/8>> CT chest: No acute findings in the chest-nonspecific pulmonary nodules bilaterally 4/8>> CT abdomen/pelvis: No acute injury/acute findings 4/8>> EtOH level: 237 4/8>> acute hepatitis serology: Negative 4/8>> TSH: 2.37 4/10> TTE - 1. Left ventricular ejection fraction, by estimation, is 30 to 35%. The left ventricle has moderately decreased function. The left ventricle demonstrates global hypokinesis. Left ventricular diastolic function could not be evaluated.  2. Right ventricular systolic function is normal. The right ventricular size is normal. There is normal pulmonary artery systolic pressure.  3. Right atrial size was mildly dilated.  4. The mitral valve is normal in structure. No evidence of mitral valve regurgitation.  5. The aortic valve is normal in structure. Aortic valve regurgitation is not visualized  Antimicrobial therapy: None  Microbiology data: 4/8>>  blood culture: Pending 4/8>> urine culture: Pending  Procedures : None  Consults: Trauma, cardiology  DVT Prophylaxis : Place TED hose Start: 12/17/20 0831 SCDs Start: 12/15/20 1557  Prophylactic Lovenox   Subjective: Patient in bed, mildly confused today but denies any headache chest or abdominal pain, no shortness of breath.   Assessment/Plan:  Mechanical fall: Likely due to alcohol intoxication and use of Xanax-await  PT/OT eval.  Imaging negative for fractures/acute injuries.  Per spouse-has had multiple falls over the past 2 weeks.  A. fib with RVR: Stable TSH, echo noted, cardiology on board.  [place] vas 2 score of at least 1, not a good long-term candidate for anticoagulation given history of alcohol use/falls-and prior SAH.  He was initially on amiodarone now better rate controlled on low-dose beta-blocker.  Newly diagnosed Acute on chronic systolic heart failure EF 30% with hypokinesis.  Will let cardiology opine, currently on beta-blocker, ACE/ARB if renal function improves and blood pressure can tolerate it.  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  Acute toxic metabolic encephalopathy/ now ? DTs: Due to alcohol intoxication-and now mild DTs, on Librium + CIWA protocol.   Last drink was 4/8.  Drinks ATLEAST 6 pack of beer daily + 2-3 shots of liquor+daily Xanax use, has been counseled to quit alcohol.  AKI: Question if he has developed underlying CKD, creatinine plateauing around 1.5 will closely monitor, continue to hold ARB, as needed IV fluids.  Hypokalemia.  Replaced.  ?  Dysphagia: Due to encephalopathy, speech on board.  Lactic acidosis:  Due to hypoperfusion in the setting of hypotension-no indication of sepsis.  Mild transaminitis: Probably mild alcoholic hepatitis-acute hepatitis serology negative.  No signs of cirrhosis on  imaging.  HLD: Remains on fenofibrate, Vascepa, Crestor - watch LFTs closely.  HTN: BP stable-B Blocker dose adjusted.  Bilateral pulmonary nodules: Per radiology - repeat CT chest in 12 months  Debility/deconditioning/multiple falls: PT-OT  Morbid Obesity: Estimated body mass index is 37.14 kg/m as calculated from the following:   Height as of this encounter: 6\' 2"  (1.88 m).   Weight as of this encounter: 131.2 kg.    Diet: Diet Order            Diet Heart Room service appropriate? Yes; Fluid consistency: Thin  Diet effective now                  Code Status:  Full code   Family Communication:  Spouse-[name]- [phone]- over the phone 12/17/20, 12/20/20  Disposition Plan:  Status is: Inpatient  Remains inpatient appropriate because:Inpatient level of care appropriate due to severity of illness   Dispo: The patient is from: Home              Anticipated d/c is to: Home              Patient currently is not medically stable to d/c.   Difficult to place patient No    Barriers to Discharge:   Afib RVR-on amiodarone infusion  Antimicrobial agents:  Anti-infectives (From admission, onward)   Start     Dose/Rate Route Frequency Ordered Stop   12/15/20 1400  vancomycin (VANCOREADY) IVPB 2000 mg/400 mL        2,000 mg 200 mL/hr over 120 Minutes Intravenous  Once 12/15/20 1358 12/15/20 1621   12/15/20 1400  ceFEPIme (MAXIPIME) 2 g in sodium chloride 0.9 % 100 mL IVPB        2 g 200 mL/hr over 30 Minutes Intravenous  Once 12/15/20 1358 12/15/20 1447       Time spent: 35 minutes-Greater than 50% of this time was spent in counseling, explanation of diagnosis, planning of further management, and coordination of care.  MEDICATIONS: Scheduled Meds: . allopurinol  100 mg Oral Daily  . apixaban  5 mg Oral BID  . [START ON 12/22/2020] chlordiazePOXIDE  5 mg Oral Q0600  . dapagliflozin propanediol  10 mg Oral Daily  . fenofibrate  160 mg Oral Daily  . folic acid  1 mg  Oral Daily  . icosapent Ethyl  2 g Oral BID  . LORazepam  0-4 mg Intravenous Q6H   Followed by  . [START ON 12/23/2020] LORazepam  0-4 mg Intravenous Q12H  . metoprolol succinate  100 mg Oral Daily  . multivitamin with minerals  1 tablet Oral Daily  . rosuvastatin  40 mg Oral Daily  . sacubitril-valsartan  1 tablet Oral BID  . thiamine  100 mg Oral Daily   Continuous Infusions:  PRN Meds:.acetaminophen, albuterol, haloperidol lactate, levalbuterol, LORazepam **OR** LORazepam, metoprolol tartrate, ondansetron (ZOFRAN) IV, traZODone   PHYSICAL EXAM: Vital signs: Vitals:   12/21/20 0008 12/21/20 0552 12/21/20 0802 12/21/20 0919  BP: 110/72 118/65 125/73   Pulse: 70 94 83 84  Resp: 17 15 16    Temp: 98.4 F (36.9 C) 98.2 F (36.8 C) 97.9 F (36.6 C)   TempSrc: Oral Oral Oral   SpO2: 96% 95% 95%   Weight:  131.2 kg    Height:       Filed  Weights   12/16/20 0322 12/17/20 0336 12/21/20 0552  Weight: 128.2 kg 131.8 kg 131.2 kg   Body mass index is 37.14 kg/m.   Gen Exam:  Awake, mildly confused, No new F.N deficits,   [place].AT,PERRAL Supple Neck,No JVD, No cervical lymphadenopathy appriciated.  Symmetrical Chest wall movement, Good air movement bilaterally, CTAB RRR,No Gallops, Rubs or new Murmurs, No Parasternal Heave +ve B.Sounds, Abd Soft, No tenderness, No organomegaly appriciated, No rebound - guarding or rigidity. No Cyanosis, Clubbing or edema, No new Rash or bruise    I have personally reviewed following labs and imaging studies  LABORATORY DATA: CBC: Recent Labs  Lab 12/15/20 1334 12/16/20 0136 12/17/20 0112 12/18/20 0147 12/19/20 0413 12/19/20 1154 12/20/20 1029 12/21/20 0206  WBC 6.6   < > 8.6 10.1 9.3  --  8.9 7.9  NEUTROABS 3.8  --   --  7.6 6.8  --  6.6 5.3  HGB 14.0   < > 14.3 14.3 15.2 15.0 16.8 14.7  HCT 42.9   < > 42.9 42.4 45.6 44.0 50.5 43.6  MCV 111.1*   < > 108.1* 106.8* 108.1*  --  105.6* 105.8*  PLT 232   < > 196 166 179  --  PLATELET  CLUMPS NOTED ON SMEAR, UNABLE TO ESTIMATE 233   < > = values in this interval not displayed.    Basic Metabolic Panel: Recent Labs  Lab 12/15/20 1552 12/16/20 0136 12/17/20 0112 12/18/20 0147 12/19/20 0413 12/19/20 1154 12/20/20 0449 12/21/20 0206  NA  --    < > 140 137 137 139 138 136  K  --    < > 3.9 3.7 3.9 4.1 5.5* 3.4*  CL  --    < > 107 102 102  --  107 101  CO2  --    < > 26 25 27   --  24 27  GLUCOSE  --    < > 98 107* 101*  --  89 104*  BUN  --    < > 17 13 15   --  17 17  CREATININE  --    < > 1.66* 1.50* 1.47*  --  1.43* 1.52*  CALCIUM  --    < > 8.3* 8.4* 8.5*  --  8.8* 8.5*  MG 2.2  --   --  1.7 1.9  --  2.1 2.0  PHOS 3.3  --   --   --   --   --   --   --    < > = values in this interval not displayed.    GFR: Estimated Creatinine Clearance: 79.1 mL/min (A) (by C-G formula based on SCr of 1.52 mg/dL (H)).  Liver Function Tests: Recent Labs  Lab 12/17/20 0112 12/18/20 0147 12/19/20 0413 12/20/20 0449 12/21/20 0206  AST 35 26 53* 49* 41  ALT 21 16 24 22 26   ALKPHOS 28* 24* 29* 29* 27*  BILITOT 1.3* 1.0 1.2 1.3* 0.8  PROT 5.7* 5.4* 5.7* 5.5* 5.6*  ALBUMIN 3.1* 2.7* 2.9* 2.8* 2.8*   No results for input(s): LIPASE, AMYLASE in the last 168 hours. Recent Labs  Lab 12/15/20 1334  AMMONIA 33    Coagulation Profile: Recent Labs  Lab 12/15/20 1334  INR 1.2    Cardiac Enzymes: Recent Labs  Lab 12/15/20 1552  CKTOTAL 377    BNP (last 3 results) No results for input(s): PROBNP in the last 8760 hours.  Lipid Profile: No results for input(s): CHOL, HDL, LDLCALC, TRIG,  CHOLHDL, LDLDIRECT in the last 72 hours.  Thyroid Function Tests: No results for input(s): TSH, T4TOTAL, FREET4, T3FREE, THYROIDAB in the last 72 hours.  Anemia Panel: No results for input(s): VITAMINB12, FOLATE, FERRITIN, TIBC, IRON, RETICCTPCT in the last 72 hours.  Urine analysis:    Component Value Date/Time   COLORURINE YELLOW 12/15/2020 1422   APPEARANCEUR CLEAR  12/15/2020 1422   LABSPEC 1.006 12/15/2020 1422   PHURINE 6.0 12/15/2020 1422   GLUCOSEU NEGATIVE 12/15/2020 1422   HGBUR NEGATIVE 12/15/2020 1422   BILIRUBINUR NEGATIVE 12/15/2020 1422   KETONESUR NEGATIVE 12/15/2020 1422   PROTEINUR NEGATIVE 12/15/2020 1422   UROBILINOGEN 0.2 03/02/2015 1337   NITRITE NEGATIVE 12/15/2020 1422   LEUKOCYTESUR NEGATIVE 12/15/2020 1422    Sepsis Labs: Lactic Acid, Venous    Component Value Date/Time   LATICACIDVEN 3.1 (HH) 12/15/2020 1552    MICROBIOLOGY: Recent Results (from the past 240 hour(s))  Blood culture (routine single)     Status: None   Collection Time: 12/15/20  1:20 PM   Specimen: BLOOD  Result Value Ref Range Status   Specimen Description BLOOD SITE NOT SPECIFIED  Final   Special Requests   Final    BOTTLES DRAWN AEROBIC AND ANAEROBIC Blood Culture results may not be optimal due to an inadequate volume of blood received in culture bottles   Culture   Final    NO GROWTH 5 DAYS Performed at [facility], [address] [place], [place] [address]    Report Status 12/20/2020 FINAL  Final  Urine culture     Status: None   Collection Time: 12/15/20  1:20 PM   Specimen: In/Out Cath Urine  Result Value Ref Range Status   Specimen Description IN/OUT CATH URINE  Final   Special Requests NONE  Final   Culture   Final    NO GROWTH Performed at [facility], [address], [place], [place] [address]    Report Status 12/16/2020 FINAL  Final  Resp Panel by RT-PCR (Flu A&B, Covid) Nasopharyngeal Swab     Status: None   Collection Time: 12/15/20  2:17 PM   Specimen: Nasopharyngeal Swab; Nasopharyngeal(NP) swabs in vial transport medium  Result Value Ref Range Status   SARS Coronavirus 2 by RT PCR NEGATIVE NEGATIVE Final    Comment: (NOTE) SARS-CoV-2 target nucleic acids are NOT DETECTED.  The SARS-CoV-2 RNA is generally detectable in upper respiratory specimens during the acute phase of infection. The  lowest concentration of SARS-CoV-2 viral copies this assay can detect is 138 copies/mL. A negative result does not preclude SARS-Cov-2 infection and should not be used as the sole basis for treatment or other patient management decisions. A negative result may occur with  improper specimen collection/handling, submission of specimen other than nasopharyngeal swab, presence of viral mutation(s) within the areas targeted by this assay, and inadequate number of viral copies(<138 copies/mL). A negative result must be combined with clinical observations, patient history, and epidemiological information. The expected result is Negative.  Fact Sheet for Patients:  BloggerCourse.com  Fact Sheet for Healthcare Providers:  SeriousBroker.it  This test is no t yet approved or cleared by the [place] FDA and  has been authorized for detection and/or diagnosis of SARS-CoV-2 by FDA under an Emergency Use Authorization (EUA). This EUA will remain  in effect (meaning this test can be used) for the duration of the COVID-19 declaration under Section 564(b)(1) of the Act, 21 U.S.C.section 360bbb-3(b)(1), unless the authorization is terminated  or revoked sooner.       Influenza A by PCR NEGATIVE NEGATIVE Final   Influenza B by PCR NEGATIVE NEGATIVE Final    Comment: (NOTE) The Xpert Xpress SARS-CoV-2/FLU/RSV plus assay is intended as an aid in the diagnosis of influenza from Nasopharyngeal swab specimens and should not be used as a sole basis for treatment. Nasal washings and aspirates are unacceptable for Xpert Xpress SARS-CoV-2/FLU/RSV testing.  Fact Sheet for Patients: BloggerCourse.comhttps://www.fda.gov/media/152166/download  Fact Sheet for Healthcare Providers: SeriousBroker.ithttps://www.fda.gov/media/152162/download  This test is not yet approved or cleared by the [place] FDA and has been authorized for detection and/or diagnosis of SARS-CoV-2 by FDA under  an Emergency Use Authorization (EUA). This EUA will remain in effect (meaning this test can be used) for the duration of the COVID-19 declaration under Section 564(b)(1) of the Act, 21 U.S.C. section 360bbb-3(b)(1), unless the authorization is terminated or revoked.  Performed at [facility], [address] [place], [place] [address]     RADIOLOGY STUDIES/RESULTS: CARDIAC CATHETERIZATION  Result Date: 12/19/2020  There is moderate left ventricular systolic dysfunction.  LV end diastolic pressure is normal.  The left ventricular ejection fraction is 35-45% by visual estimate.  Hemodynamic findings consistent with mild pulmonary hypertension.  1. Normal coronary anatomy 2. Moderate LV dysfunction. EF estimated at 40% 3. Normal LV filling pressures 4. Mild pulmonary HTN. Mean PAP 24 mm Hg 5. Reduced cardiac output. Co ox 63%. Cardiac index 1.89 Plan: medical management. OK to start oral anticoagulation this pm.   DG Chest Port 1 View  Result Date: 12/21/2020 CLINICAL DATA:  Dyspnea EXAM: PORTABLE CHEST 1 VIEW COMPARISON:  12/15/2020 FINDINGS: Lungs are clear. No pneumothorax or pleural effusion. Mild to moderate cardiomegaly is stable. Pulmonary vascularity is normal. No acute bone abnormality. IMPRESSION: Stable cardiomegaly. Electronically Signed   By: Helyn NumbersAshesh  Parikh MD   On: 12/21/2020 07:35     LOS: 6 days   Susa RaringPrashant Coraima Tibbs, MD  [facility]  12/21/2020, 9:31 AM

## 2020-12-21 NOTE — Progress Notes (Signed)
Heart Failure Stewardship Pharmacist Progress Note   PCP: Olive Bass, FNP PCP-Cardiologist: Chrystie Nose, MD    HPI:  55 yo M with PMH of HLD, HTN, brain aneurysm, tobacco use, alcohol use, anxiety, depression and gout. He presented to the ED on 12/15/20 after he was found down, intoxicated, in afib with RVR, and hypotensive. Cardiology was consulted and ECHO obtained on 12/17/20. LVEF was reduced to 30-35%. R/LHC completed on 12/19/20 and found to have normal coronary arteries with mildly elevated filling pressures (RA 11, PA 24, wedge 14, CO 4.8, CI 1.9)  Current HF Medications: Metoprolol XL 100 mg daily Entresto 24/26 mg BID Farxiga 10 mg daily  Prior to admission HF Medications: Losartan 100 mg daily  Pertinent Lab Values: . Serum creatinine 1.52, BUN 17, Potassium 3.4, Sodium 136, BNP 411.2, Magnesium 2.0  Vital Signs: . Weight: 289 lbs (admission weight: 282 lbs) . Blood pressure: 110/70s  . Heart rate: 80-90s  Medication Assistance / Insurance Benefits Check: Does the patient have prescription insurance?  No   Does the patient qualify for medication assistance through manufacturers or grants? Pending - he was seen by CSW at [facility] office and states he won the lottery, may not qualify from financial standpoint  Outpatient Pharmacy:  Prior to admission [facility] Is the patient willing to use [facility] TOC pharmacy at discharge? Yes Is the patient willing to transition their outpatient pharmacy to utilize a [facility] outpatient pharmacy?   Pending    Assessment: 1. Acute on chronic systolic CHF (EF 95-28%), due to NICM. NYHA class II symptoms. - Continue metoprolol XL 100 mg daily - Agree with starting Entresto 24/26 mg BID  - Consider starting spironolactone 12.5 mg daily as outpatient. K up to 5.5 yesterday but down to 3.4 today. Question if this was a lab error. Potassium replaced today. - Continue Farxiga 10 mg daily   Plan: 1) Medication  changes recommended at this time: - Agree with changes as above  2) Patient assistance: - Patient is uninsured - may not be able to qualify for patient assistance given financial information. Pt reported to CSW that he prefers to pay cash price for medications rather than pay for insurance - Appreciate assistance from CSW  3)  Education  - To be completed prior to discharge  Sharen Hones, PharmD, BCPS Heart Failure Engineer, building services Phone [phone]

## 2020-12-22 LAB — BASIC METABOLIC PANEL
Anion gap: 6 (ref 5–15)
BUN: 16 mg/dL (ref 6–20)
CO2: 22 mmol/L (ref 22–32)
Calcium: 8.7 mg/dL — ABNORMAL LOW (ref 8.9–10.3)
Chloride: 108 mmol/L (ref 98–111)
Creatinine, Ser: 1.41 mg/dL — ABNORMAL HIGH (ref 0.61–1.24)
GFR, Estimated: 59 mL/min — ABNORMAL LOW (ref >60)
Glucose, Bld: 110 mg/dL — ABNORMAL HIGH (ref 70–99)
Potassium: 4 mmol/L (ref 3.5–5.1)
Sodium: 136 mmol/L (ref 135–145)

## 2020-12-22 NOTE — Progress Notes (Signed)
Heart Failure Stewardship Pharmacist Progress Note   PCP: Olive Bass, FNP PCP-Cardiologist: Chrystie Nose, MD    HPI:  55 yo M with PMH of HLD, HTN, brain aneurysm, tobacco use, alcohol use, anxiety, depression and gout. He presented to the ED on 12/15/20 after he was found down, intoxicated, in afib with RVR, and hypotensive. Cardiology was consulted and ECHO obtained on 12/17/20. LVEF was reduced to 30-35%. R/LHC completed on 12/19/20 and found to have normal coronary arteries with mildly elevated filling pressures (RA 11, PA 24, wedge 14, CO 4.8, CI 1.9).  Current HF Medications: Metoprolol XL 100 mg daily Entresto 24/26 mg BID Farxiga 10 mg daily  Prior to admission HF Medications: Losartan 100 mg daily  Pertinent Lab Values: . Serum creatinine 1.41, BUN 16, Potassium 4.0, Sodium 136, BNP 411.2, Magnesium 2.0  Vital Signs: . Weight: 289 lbs (admission weight: 282 lbs) . Blood pressure: 110/70s (most recently 92/70) . Heart rate: 80-90s  Medication Assistance / Insurance Benefits Check: Does the patient have prescription insurance?  No   Does the patient qualify for medication assistance through manufacturers or grants? Pending - he was seen by CSW at [facility] office and states he won the lottery, may not qualify from financial standpoint  Outpatient Pharmacy:  Prior to admission [facility] Is the patient willing to use [facility] pharmacy at discharge? Yes Is the patient willing to transition their outpatient pharmacy to utilize a [facility] outpatient pharmacy?   Pending    Assessment: 1. Acute on chronic systolic CHF (EF 80-99%), due to NICM. NYHA class II symptoms. - Continue metoprolol XL 100 mg daily - Continue Entresto 24/26 mg BID  - Consider starting spironolactone 12.5 mg daily if BP allows - Continue Farxiga 10 mg daily   Plan: 1) Medication changes recommended at this time: - Continue current regimen  2) Patient assistance: -  Patient is uninsured - may not be able to qualify for patient assistance given financial information. Pt reported to CSW that he prefers to pay cash price for medications rather than pay for insurance - Appreciate assistance from CSW  3)  Education  - To be completed prior to discharge  Sharen Hones, PharmD, BCPS Heart Failure Engineer, building services Phone [phone]

## 2020-12-22 NOTE — Progress Notes (Signed)
Occupational Therapy Treatment Patient Details Name: Daniel Butler MRN: [id number] DOB: 1965/10/09 Today's Date: 12/22/2020    History of present illness Daniel Butler is a 55 y.o. male presents from home after having a unwitnessed fall due to alcohol intoxication on 12/15/20. Additionally, pt found to have Afib with RVR, AKI, LE edema, and hyperkalemia.  Pt with normal cardiac cath on 12/19/20.  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 Continued to reinforce education regarding energy conservation strategies during ADL and functional mobility. Pt requires minA to stand from an elevated surface, minA for functional mobility at RW level and minguard for grooming while standing at sink level. He demonstrates improvement with activity progression this date. Pt's wife demonstrated ability to provide safe and appropriate level of assistance during session. Pt will continue to benefit from skilled OT services to maximize safety and independence with ADL/IADL and functional mobility. Will continue to follow acutely and progress as tolerated.   Pt in afib during session HR 102bpm-132bpm  BP 126/75 supine BP 106/68 sitting BP 108/75 standing (dizziness improved after 1 min) BP 104/70 standing 3 min   Follow Up Recommendations  Home health OT;Supervision/Assistance - 24 hour (initially)    Equipment Recommendations  3 in 1 bedside commode;Wheelchair (measurements OT);Wheelchair cushion (measurements OT)    Recommendations for Other Services      Precautions / Restrictions Precautions Precautions: Fall Precaution Comments: Dizziness with upright activity, no appreciable BP drop Restrictions Weight Bearing Restrictions: No       Mobility Bed Mobility Overal bed  mobility: Modified Independent                  Transfers Overall transfer level: Needs assistance Equipment used: Rolling walker (2 wheeled) Transfers: Sit to/from Stand Sit to Stand: Min assist;From elevated surface         General transfer comment: minA to powerup into standing from elevated surface    Balance Overall balance assessment: Needs assistance Sitting-balance support: Feet supported Sitting balance-Leahy Scale: Good Sitting balance - Comments: reports dizziness with initial sitting up, got better as he sat longer.   Standing balance support: No upper extremity supported;During functional activity Standing balance-Leahy Scale: Fair Standing balance comment: tolerated static standing without UE support for short duration, improved stability with use of UE support                           ADL either performed or assessed with clinical judgement   ADL Overall ADL's : Needs assistance/impaired Eating/Feeding: Set up;Sitting   Grooming: Min guard;Standing Grooming Details (indicate cue type and reason): at sink level washed face and completed oral care             Lower Body Dressing: Moderate assistance;Sitting/lateral leans;Sit to/from stand;Cueing for safety   Toilet Transfer: Minimal assistance;Ambulation;RW Toilet Transfer Details (indicate cue type and reason): simulated in room         Functional mobility during ADLs: Minimal assistance;Rolling walker;Cueing for safety General ADL Comments: limited by decreased activity tolerance;continued to reinforce energy conservation strategies     Vision   Vision Assessment?: No apparent visual deficits   Perception     Praxis      Cognition Arousal/Alertness: Awake/alert Behavior During Therapy: WFL for tasks assessed/performed Overall Cognitive  Status: Within Functional Limits for tasks assessed Area of Impairment: Safety/judgement                          Safety/Judgement: Decreased awareness of safety;Decreased awareness of deficits     General Comments: continues to demonstrate decreased awareness of significance of dizziness and of safety,        Exercises Exercises: Other exercises Other Exercises Other Exercises: demonstrated and educated pt and wife on safe progression of how to get up after a fall   Shoulder Instructions       General Comments pt's spouse in the room during session, provided hands-on assistance for supporting pt throughout sessionm, demonstrated safe and appropriate assistance of pt    Pertinent Vitals/ Pain       Pain Assessment: Faces Faces Pain Scale: Hurts a little bit Pain Location: Grimace with movement, but also R meniscus injury Pain Descriptors / Indicators: Discomfort;Grimacing Pain Intervention(s): Limited activity within patient's tolerance;Monitored during session  Home Living                                          Prior Functioning/Environment              Frequency  Min 2X/week        Progress Toward Goals  OT Goals(current goals can now be found in the care plan section)  Progress towards OT goals: Progressing toward goals  Acute Rehab OT Goals Patient Stated Goal: Be able to get home and back to independence OT Goal Formulation: With patient/family Time For Goal Achievement: 01/01/21 Potential to Achieve Goals: Good ADL Goals Pt Will Perform Grooming: with min guard assist;standing Pt Will Perform Lower Body Dressing: with min guard assist;sit to/from stand;sitting/lateral leans Pt Will Transfer to Toilet: with min guard assist;ambulating;regular height toilet Pt Will Perform Toileting - Clothing Manipulation and hygiene: with min guard assist;sitting/lateral leans;sit to/from stand Pt/caregiver will Perform Home Exercise Program: Increased strength;Increased ROM;Right Upper extremity;With theraband;With written HEP provided Additional ADL Goal #1:  Pt will increase to x5 mins of standing OOB ADL tasks with 1 seated rest break and O2 >90%.  Plan Discharge plan remains appropriate    Co-evaluation                 AM-PAC OT "6 Clicks" Daily Activity     Outcome Measure   Help from another person eating meals?: A Little Help from another person taking care of personal grooming?: A Little Help from another person toileting, which includes using toliet, bedpan, or urinal?: A Little Help from another person bathing (including washing, rinsing, drying)?: A Lot Help from another person to put on and taking off regular upper body clothing?: A Little Help from another person to put on and taking off regular lower body clothing?: A Lot 6 Click Score: 16    End of Session Equipment Utilized During Treatment: Rolling walker;Gait belt  OT Visit Diagnosis: Unsteadiness on feet (R26.81);Muscle weakness (generalized) (M62.81);Pain;Other symptoms and signs involving cognitive function Pain - Right/Left: Right Pain - part of body: Knee   Activity Tolerance Patient limited by fatigue;Patient tolerated treatment well   Patient Left in chair;with call bell/phone within reach;with chair alarm set;with family/visitor present   Nurse Communication Mobility status        Time: 4967-5916 OT Time Calculation (min): 27 min  Charges: OT General  Charges $OT Visit: 1 Visit OT Treatments $Self Care/Home Management : 23-37 mins  Rosey Bath OTR/L Acute Rehabilitation Services Office: [phone]    Rebeca Alert 12/22/2020, 10:50 AM

## 2020-12-22 NOTE — Progress Notes (Signed)
Progress Note  Patient Name: Daniel Butler Date of Encounter: 12/22/2020  [facility] Cardiologist: Chrystie Nose, MD   Subjective   Denies any dyspnea or chest pain  Inpatient Medications    Scheduled Meds: . allopurinol  100 mg Oral Daily  . apixaban  5 mg Oral BID  . chlordiazePOXIDE  5 mg Oral Q0600  . dapagliflozin propanediol  10 mg Oral Daily  . fenofibrate  160 mg Oral Daily  . folic acid  1 mg Oral Daily  . icosapent Ethyl  2 g Oral BID  . LORazepam  0-4 mg Intravenous Q6H   Followed by  . [START ON 12/23/2020] LORazepam  0-4 mg Intravenous Q12H  . metoprolol succinate  100 mg Oral Daily  . multivitamin with minerals  1 tablet Oral Daily  . rosuvastatin  40 mg Oral Daily  . sacubitril-valsartan  1 tablet Oral BID  . thiamine  100 mg Oral Daily   Continuous Infusions:  PRN Meds: acetaminophen, albuterol, haloperidol lactate, levalbuterol, LORazepam **OR** LORazepam, metoprolol tartrate, ondansetron (ZOFRAN) IV, traZODone   Vital Signs    Vitals:   12/21/20 1923 12/21/20 2302 12/22/20 0332 12/22/20 0700  BP: 103/70 108/76 113/79 114/75  Pulse: 87 81 100 99  Resp: 16 19 20 20   Temp: 98.3 F (36.8 C) 97.7 F (36.5 C) 98.2 F (36.8 C) 98 F (36.7 C)  TempSrc: Oral Oral Oral Oral  SpO2: 98% 95% 95% 97%  Weight:   131.2 kg   Height:        Intake/Output Summary (Last 24 hours) at 12/22/2020 0821 Last data filed at 12/21/2020 1927 Gross per 24 hour  Intake 120 ml  Output 800 ml  Net -680 ml   Last 3 Weights 12/22/2020 12/21/2020 12/17/2020  Weight (lbs) 289 lb 3.9 oz 289 lb 3.9 oz 290 lb 9.1 oz  Weight (kg) 131.2 kg 131.2 kg 131.8 kg      Telemetry    AF 80-90s,- Personally Reviewed  ECG    No new EKG - Personally Reviewed  Physical Exam   GEN: No acute distress.   Neck: No JVD Cardiac: irregular, normal rate, no murmurs, rubs, or gallops.  Respiratory: CTAB GI: Soft, nontender MS: trace BLE edema Neuro:  Nonfocal  Psych:  Normal affect   Labs    High Sensitivity Troponin:  No results for input(s): TROPONINIHS in the last 720 hours.    Chemistry Recent Labs  Lab 12/19/20 0413 12/19/20 1154 12/20/20 0449 12/21/20 0206  NA 137 139 138 136  K 3.9 4.1 5.5* 3.4*  CL 102  --  107 101  CO2 27  --  24 27  GLUCOSE 101*  --  89 104*  BUN 15  --  17 17  CREATININE 1.47*  --  1.43* 1.52*  CALCIUM 8.5*  --  8.8* 8.5*  PROT 5.7*  --  5.5* 5.6*  ALBUMIN 2.9*  --  2.8* 2.8*  AST 53*  --  49* 41  ALT 24  --  22 26  ALKPHOS 29*  --  29* 27*  BILITOT 1.2  --  1.3* 0.8  GFRNONAA 56*  --  58* 54*  ANIONGAP 8  --  7 8     Hematology Recent Labs  Lab 12/19/20 0413 12/19/20 1154 12/20/20 1029 12/21/20 0206  WBC 9.3  --  8.9 7.9  RBC 4.22  --  4.78 4.12*  HGB 15.2 15.0 16.8 14.7  HCT 45.6 44.0 50.5 43.6  MCV 108.1*  --  105.6* 105.8*  MCH 36.0*  --  35.1* 35.7*  MCHC 33.3  --  33.3 33.7  RDW 14.6  --  14.3 14.3  PLT 179  --  PLATELET CLUMPS NOTED ON SMEAR, UNABLE TO ESTIMATE 233    BNP Recent Labs  Lab 12/19/20 0413 12/20/20 0449 12/21/20 0206  BNP 845.8* 511.7* 411.2*     DDimer  Recent Labs  Lab 12/15/20 1334  DDIMER 0.61*     Radiology    DG Chest Port 1 View  Result Date: 12/21/2020 CLINICAL DATA:  Dyspnea EXAM: PORTABLE CHEST 1 VIEW COMPARISON:  12/15/2020 FINDINGS: Lungs are clear. No pneumothorax or pleural effusion. Mild to moderate cardiomegaly is stable. Pulmonary vascularity is normal. No acute bone abnormality. IMPRESSION: Stable cardiomegaly. Electronically Signed   By: Helyn Numbers MD   On: 12/21/2020 07:35    Cardiac Studies   Echo 12/18/19: 1. Left ventricular ejection fraction, by estimation, is 30 to 35%. The  left ventricle has moderately decreased function. The left ventricle  demonstrates global hypokinesis. Left ventricular diastolic function could  not be evaluated.  2. Right ventricular systolic function is normal. The right ventricular  size is normal.  There is normal pulmonary artery systolic pressure.  3. Right atrial size was mildly dilated.  4. The mitral valve is normal in structure. No evidence of mitral valve  regurgitation.  5. The aortic valve is normal in structure. Aortic valve regurgitation is  not visualized.   Patient Profile     55 y.o. male  with a hx of hyperlipidemia followed by 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  New diagnosis.  Suspect tachycardia induced from A. fib or EtOH cardiomyopathy.  Normal coronary arteries on cath 4/12.  RHC with RA 11, RV 27/6, PA 32/11/24, PWCP 14, LVEDP 14, CI 1.9, PA sat 63% -Continue metoprolol, consolidate today to toprol XL 100 mg daily -Continue Farxiga 10 mg daily -Continue entresto 24-26 mg BID -Hold off on spironolactone for now given hyperkalemia 4/13.  Will monitor K+ with starting entresto, can plan to add spironolactone as outpatient.  Atrial fib with RVR: new diagnosis.  CHADSVASc score 2 (CHF, HTN).  Started on metoprolol and IV amiodarone -Has been felt to not be good anticoagulation candidate given issues with falls related to alcohol use, and also h/o SAH with MCA aneurysm s/p clipping in 2016.  I discussed this with neurosurgery, given SAH due to the aneurysm that was clipped, risk of recurrent bleeding is low and not a contraindication to anticoagulation.  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  He and his wife are adamant that he will stay away from alcohol use.  We will start Eliquis 5 mg twice daily.  Plan outpatient follow-up in 3 weeks, if remains in A. fib and  doing well with no falls, can schedule for cardioversion at that time -Continue Toprol-XL 100 mg  AKI: Cr 2.1 on admission, now appears stable at 1.4-1.5.  Continue to monitor   [facility] will sign off.   Medication Recommendations:  Eliquis 5 mg BID, farxiga 10 mg daily,  toprol XL 100 mg daily, entresto 24-26 mg BID.  Plan to start spironolactone as outpatient if stable K+ Other recommendations (labs, testing, etc):  BMET in 1 week Follow up as an outpatient:  Scheduled with Edd Fabian on 5/9.  If still in Afib at that time and tolerating anticoagulation and not having falls, recommend cardioversion.    For questions or updates, please contact [facility] Please consult [email or web address] for contact info under        Signed, Little Ishikawa, MD  12/22/2020, 8:21 AM

## 2020-12-22 NOTE — Plan of Care (Signed)
  Problem: Activity: Goal: Capacity to carry out activities will improve Outcome: Progressing   Problem: Education: Goal: Knowledge of General Education information will improve Description: Including pain rating scale, medication(s)/side effects and non-pharmacologic comfort measures Outcome: Progressing   Problem: Health Behavior/Discharge Planning: Goal: Ability to manage health-related needs will improve Outcome: Progressing   Problem: Clinical Measurements: Goal: Ability to maintain clinical measurements within normal limits will improve Outcome: Progressing Goal: Will remain free from infection Outcome: Progressing Goal: Diagnostic test results will improve Outcome: Progressing Goal: Respiratory complications will improve Outcome: Progressing Goal: Cardiovascular complication will be avoided Outcome: Progressing   Problem: Activity: Goal: Risk for activity intolerance will decrease Outcome: Progressing   Problem: Nutrition: Goal: Adequate nutrition will be maintained Outcome: Progressing   Problem: Coping: Goal: Level of anxiety will decrease Outcome: Progressing   Problem: Elimination: Goal: Will not experience complications related to bowel motility Outcome: Progressing Goal: Will not experience complications related to urinary retention Outcome: Progressing   Problem: Pain Managment: Goal: General experience of comfort will improve Outcome: Progressing   Problem: Safety: Goal: Ability to remain free from injury will improve Outcome: Progressing   Problem: Skin Integrity: Goal: Risk for impaired skin integrity will decrease Outcome: Progressing   Problem: Education: Goal: Ability to demonstrate management of disease process will improve Outcome: Progressing Goal: Ability to verbalize understanding of medication therapies will improve Outcome: Progressing Goal: Individualized Educational Video(s) Outcome: Progressing   Problem: Cardiac: Goal:  Ability to achieve and maintain adequate cardiopulmonary perfusion will improve Outcome: Progressing   

## 2020-12-22 NOTE — Plan of Care (Signed)
  Problem: Activity: Goal: Capacity to carry out activities will improve Outcome: Progressing   Problem: Education: Goal: Knowledge of General Education information will improve Description: Including pain rating scale, medication(s)/side effects and non-pharmacologic comfort measures Outcome: Progressing   Problem: Health Behavior/Discharge Planning: Goal: Ability to manage health-related needs will improve Outcome: Progressing   Problem: Clinical Measurements: Goal: Ability to maintain clinical measurements within normal limits will improve Outcome: Progressing Goal: Will remain free from infection Outcome: Progressing Goal: Diagnostic test results will improve Outcome: Progressing Goal: Respiratory complications will improve Outcome: Progressing Goal: Cardiovascular complication will be avoided Outcome: Progressing   Problem: Activity: Goal: Risk for activity intolerance will decrease Outcome: Progressing   Problem: Nutrition: Goal: Adequate nutrition will be maintained Outcome: Progressing   Problem: Coping: Goal: Level of anxiety will decrease Outcome: Progressing   Problem: Elimination: Goal: Will not experience complications related to bowel motility Outcome: Progressing Goal: Will not experience complications related to urinary retention Outcome: Progressing   Problem: Pain Managment: Goal: General experience of comfort will improve Outcome: Progressing   Problem: Safety: Goal: Ability to remain free from injury will improve Outcome: Progressing   Problem: Skin Integrity: Goal: Risk for impaired skin integrity will decrease Outcome: Progressing   Problem: Education: Goal: Ability to demonstrate management of disease process will improve Outcome: Progressing Goal: Ability to verbalize understanding of medication therapies will improve Outcome: Progressing Goal: Individualized Educational Video(s) Outcome: Progressing   Problem: Cardiac: Goal:  Ability to achieve and maintain adequate cardiopulmonary perfusion will improve Outcome: Progressing

## 2020-12-22 NOTE — Progress Notes (Signed)
PROGRESS NOTE        PATIENT DETAILS Name: Daniel Butler Age: 55 y.o. Sex: male Date of Birth: [birth year] Admit Date: 12/15/2020 Admitting Physician Clydie Braun, MD YIF:OYDXAJ, Allyne Gee, FNP  Brief Narrative: Patient is a 55 y.o. male significant past medical history of alcohol abuse, SAH-s/p craniotomy with clipping in 2016, HTN, HLD, depression/anxiety-who presented to the hospital in the setting of a mechanical fall due to alcohol intoxication.  In the ED-patient was evaluated by trauma services-and found to have no major injuries.  However he was found to have A. fib with RVR, significant lower extremity edema, AKI-started on amiodarone infusion and admitted to the hospitalist service.  See below for further details.  Significant events: 4/8>> fall-alcohol intoxication-brought to ED-found to be in RVR/AKI/lower extremity edema.  Admit to [facility]  Significant studies: 4/8>> CT head: No intracranial abnormalities 4/8>> CT C-spine: No signs of cervical fracture or dislocation 4/8>> CT chest: No acute findings in the chest-nonspecific pulmonary nodules bilaterally 4/8>> CT abdomen/pelvis: No acute injury/acute findings 4/8>> EtOH level: 237 4/8>> acute hepatitis serology: Negative 4/8>> TSH: 2.37 4/10> TTE - 1. Left ventricular ejection fraction, by estimation, is 30 to 35%. The left ventricle has moderately decreased function. The left ventricle demonstrates global hypokinesis. Left ventricular diastolic function could not be evaluated.  2. Right ventricular systolic function is normal. The right ventricular size is normal. There is normal pulmonary artery systolic pressure.  3. Right atrial size was mildly dilated.  4. The mitral valve is normal in structure. No evidence of mitral valve regurgitation.  5. The aortic valve is normal in structure. Aortic valve regurgitation is not visualized  Antimicrobial therapy: None  Microbiology data: 4/8>>  blood culture: Pending 4/8>> urine culture: Pending  Procedures : None  Consults: Trauma, cardiology  DVT Prophylaxis : Place TED hose Start: 12/17/20 0831 SCDs Start: 12/15/20 1557  Prophylactic Lovenox   Subjective: Patient in bed, appears comfortable, denies any headache, no fever, no chest pain or pressure, no shortness of breath , no abdominal pain. No new focal weakness.    Assessment/Plan:  Mechanical fall: Likely due to alcohol intoxication and use of Xanax-await  PT/OT eval.  Imaging negative for fractures/acute injuries.  Per spouse-has had multiple falls over the past 2 weeks.  A. fib with RVR: Stable TSH, echo noted, cardiology on board.  [place] vas 2 score of at least 1, per Cards placed on Eliquis.  He was initially on amiodarone now better rate controlled on low-dose beta-blocker.  Newly diagnosed Acute on chronic systolic heart failure EF 30% with hypokinesis.  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  Acute toxic metabolic encephalopathy/ now ? DTs: Due to alcohol intoxication-and now mild DTs, on Librium + CIWA protocol.   Last drink was 4/8.  Drinks ATLEAST 6 pack of beer daily + 2-3 shots of liquor+daily Xanax use, has been counseled to quit alcohol.  AKI: Question if he has developed underlying CKD, creatinine plateauing around 1.5 will closely monitor, continue to hold ARB, as needed IV fluids.  Hypokalemia.  Replaced.  ?  Dysphagia: Due to encephalopathy, speech on board.  Lactic acidosis: Due to hypoperfusion in the setting of hypotension-no indication of sepsis.  Mild  transaminitis: Probably mild alcoholic hepatitis-acute hepatitis serology negative.  No signs of cirrhosis on imaging.  HLD: Remains on fenofibrate, Vascepa, Crestor - watch LFTs closely.  HTN: BP stable-B Blocker dose  adjusted.  Bilateral pulmonary nodules: Per radiology - repeat CT chest in 12 months  Debility/deconditioning/multiple falls: PT-OT  Morbid Obesity: Estimated body mass index is 37.14 kg/m as calculated from the following:   Height as of this encounter: 6\' 2"  (1.88 m).   Weight as of this encounter: 131.2 kg.    Diet: Diet Order            Diet Heart Room service appropriate? No; Fluid consistency: Thin  Diet effective now                  Code Status:  Full code   Family Communication:  Spouse-[name]- [phone]- over the phone 12/17/20, 12/20/20, 12/21/20  Disposition Plan:  Status is: Inpatient  Remains inpatient appropriate because:Inpatient level of care appropriate due to severity of illness   Dispo: The patient is from: Home              Anticipated d/c is to: Home              Patient currently is not medically stable to d/c.   Difficult to place patient No    Barriers to Discharge:   Afib RVR-on amiodarone infusion  Antimicrobial agents:  Anti-infectives (From admission, onward)   Start     Dose/Rate Route Frequency Ordered Stop   12/15/20 1400  vancomycin (VANCOREADY) IVPB 2000 mg/400 mL        2,000 mg 200 mL/hr over 120 Minutes Intravenous  Once 12/15/20 1358 12/15/20 1621   12/15/20 1400  ceFEPIme (MAXIPIME) 2 g in sodium chloride 0.9 % 100 mL IVPB        2 g 200 mL/hr over 30 Minutes Intravenous  Once 12/15/20 1358 12/15/20 1447       Time spent: 35 minutes-Greater than 50% of this time was spent in counseling, explanation of diagnosis, planning of further management, and coordination of care.  MEDICATIONS: Scheduled Meds: . allopurinol  100 mg Oral Daily  . apixaban  5 mg Oral BID  . chlordiazePOXIDE  5 mg Oral Q0600  . dapagliflozin propanediol  10 mg Oral Daily  . fenofibrate  160 mg Oral Daily  . folic acid  1 mg Oral Daily  . icosapent Ethyl  2 g Oral BID  . LORazepam  0-4 mg Intravenous Q6H   Followed by  . [START ON 12/23/2020]  LORazepam  0-4 mg Intravenous Q12H  . metoprolol succinate  100 mg Oral Daily  . multivitamin with minerals  1 tablet Oral Daily  . rosuvastatin  40 mg Oral Daily  . sacubitril-valsartan  1 tablet Oral BID  . thiamine  100 mg Oral Daily   Continuous Infusions:  PRN Meds:.acetaminophen, albuterol, haloperidol lactate, levalbuterol, LORazepam **OR** LORazepam, metoprolol tartrate, ondansetron (ZOFRAN) IV, traZODone   PHYSICAL EXAM: Vital signs: Vitals:   12/21/20 1923 12/21/20 2302 12/22/20 0332 12/22/20 0700  BP: 103/70 108/76 113/79 114/75  Pulse: 87 81 100 99  Resp: 16 19 20 20   Temp: 98.3 F (36.8 C) 97.7 F (36.5 C) 98.2 F (36.8 C) 98 F (36.7 C)  TempSrc: Oral Oral Oral Oral  SpO2: 98% 95% 95% 97%  Weight:   131.2 kg   Height:       Filed Weights   12/17/20 0336 12/21/20 0552 12/22/20 0332  Weight: 131.8  kg 131.2 kg 131.2 kg   Body mass index is 37.14 kg/m.   Gen Exam:  Awake Alert, No new F.N deficits, Normal affect [place].AT,PERRAL Supple Neck,No JVD, No cervical lymphadenopathy appriciated.  Symmetrical Chest wall movement, Good air movement bilaterally, CTAB RRR,No Gallops, Rubs or new Murmurs, No Parasternal Heave +ve B.Sounds, Abd Soft, No tenderness, No organomegaly appriciated, No rebound - guarding or rigidity. No Cyanosis, Clubbing or edema, No new Rash or bruise  I have personally reviewed following labs and imaging studies  LABORATORY DATA: CBC: Recent Labs  Lab 12/15/20 1334 12/16/20 0136 12/17/20 0112 12/18/20 0147 12/19/20 0413 12/19/20 1154 12/20/20 1029 12/21/20 0206  WBC 6.6   < > 8.6 10.1 9.3  --  8.9 7.9  NEUTROABS 3.8  --   --  7.6 6.8  --  6.6 5.3  HGB 14.0   < > 14.3 14.3 15.2 15.0 16.8 14.7  HCT 42.9   < > 42.9 42.4 45.6 44.0 50.5 43.6  MCV 111.1*   < > 108.1* 106.8* 108.1*  --  105.6* 105.8*  PLT 232   < > 196 166 179  --  PLATELET CLUMPS NOTED ON SMEAR, UNABLE TO ESTIMATE 233   < > = values in this interval not displayed.     Basic Metabolic Panel: Recent Labs  Lab 12/15/20 1552 12/16/20 0136 12/18/20 0147 12/19/20 0413 12/19/20 1154 12/20/20 0449 12/21/20 0206 12/22/20 0721  NA  --    < > 137 137 139 138 136 136  K  --    < > 3.7 3.9 4.1 5.5* 3.4* 4.0  CL  --    < > 102 102  --  107 101 108  CO2  --    < > 25 27  --  24 27 22   GLUCOSE  --    < > 107* 101*  --  89 104* 110*  BUN  --    < > 13 15  --  17 17 16   CREATININE  --    < > 1.50* 1.47*  --  1.43* 1.52* 1.41*  CALCIUM  --    < > 8.4* 8.5*  --  8.8* 8.5* 8.7*  MG 2.2  --  1.7 1.9  --  2.1 2.0  --   PHOS 3.3  --   --   --   --   --   --   --    < > = values in this interval not displayed.    GFR: Estimated Creatinine Clearance: 85.2 mL/min (A) (by C-G formula based on SCr of 1.41 mg/dL (H)).  Liver Function Tests: Recent Labs  Lab 12/17/20 0112 12/18/20 0147 12/19/20 0413 12/20/20 0449 12/21/20 0206  AST 35 26 53* 49* 41  ALT 21 16 24 22 26   ALKPHOS 28* 24* 29* 29* 27*  BILITOT 1.3* 1.0 1.2 1.3* 0.8  PROT 5.7* 5.4* 5.7* 5.5* 5.6*  ALBUMIN 3.1* 2.7* 2.9* 2.8* 2.8*   No results for input(s): LIPASE, AMYLASE in the last 168 hours. Recent Labs  Lab 12/15/20 1334  AMMONIA 33    Coagulation Profile: Recent Labs  Lab 12/15/20 1334  INR 1.2    Cardiac Enzymes: Recent Labs  Lab 12/15/20 1552  CKTOTAL 377    BNP (last 3 results) No results for input(s): PROBNP in the last 8760 hours.  Lipid Profile: No results for input(s): CHOL, HDL, LDLCALC, TRIG, CHOLHDL, LDLDIRECT in the last 72 hours.  Thyroid Function Tests: No results for input(s):  TSH, T4TOTAL, FREET4, T3FREE, THYROIDAB in the last 72 hours.  Anemia Panel: No results for input(s): VITAMINB12, FOLATE, FERRITIN, TIBC, IRON, RETICCTPCT in the last 72 hours.  Urine analysis:    Component Value Date/Time   COLORURINE YELLOW 12/15/2020 1422   APPEARANCEUR CLEAR 12/15/2020 1422   LABSPEC 1.006 12/15/2020 1422   PHURINE 6.0 12/15/2020 1422   GLUCOSEU  NEGATIVE 12/15/2020 1422   HGBUR NEGATIVE 12/15/2020 1422   BILIRUBINUR NEGATIVE 12/15/2020 1422   KETONESUR NEGATIVE 12/15/2020 1422   PROTEINUR NEGATIVE 12/15/2020 1422   UROBILINOGEN 0.2 03/02/2015 1337   NITRITE NEGATIVE 12/15/2020 1422   LEUKOCYTESUR NEGATIVE 12/15/2020 1422    Sepsis Labs: Lactic Acid, Venous    Component Value Date/Time   LATICACIDVEN 3.1 (HH) 12/15/2020 1552    MICROBIOLOGY: Recent Results (from the past 240 hour(s))  Blood culture (routine single)     Status: None   Collection Time: 12/15/20  1:20 PM   Specimen: BLOOD  Result Value Ref Range Status   Specimen Description BLOOD SITE NOT SPECIFIED  Final   Special Requests   Final    BOTTLES DRAWN AEROBIC AND ANAEROBIC Blood Culture results may not be optimal due to an inadequate volume of blood received in culture bottles   Culture   Final    NO GROWTH 5 DAYS Performed at [facility], [address] [place], [place] [address]    Report Status 12/20/2020 FINAL  Final  Urine culture     Status: None   Collection Time: 12/15/20  1:20 PM   Specimen: In/Out Cath Urine  Result Value Ref Range Status   Specimen Description IN/OUT CATH URINE  Final   Special Requests NONE  Final   Culture   Final    NO GROWTH Performed at [facility], [address] [place], [place] [address]    Report Status 12/16/2020 FINAL  Final  Resp Panel by RT-PCR (Flu A&B, Covid) Nasopharyngeal Swab     Status: None   Collection Time: 12/15/20  2:17 PM   Specimen: Nasopharyngeal Swab; Nasopharyngeal(NP) swabs in vial transport medium  Result Value Ref Range Status   SARS Coronavirus 2 by RT PCR NEGATIVE NEGATIVE Final    Comment: (NOTE) SARS-CoV-2 target nucleic acids are NOT DETECTED.  The SARS-CoV-2 RNA is generally detectable in upper respiratory specimens during the acute phase of infection. The lowest concentration of SARS-CoV-2 viral copies this assay can detect is 138 copies/mL. A negative result  does not preclude SARS-Cov-2 infection and should not be used as the sole basis for treatment or other patient management decisions. A negative result may occur with  improper specimen collection/handling, submission of specimen other than nasopharyngeal swab, presence of viral mutation(s) within the areas targeted by this assay, and inadequate number of viral copies(<138 copies/mL). A negative result must be combined with clinical observations, patient history, and epidemiological information. The expected result is Negative.  Fact Sheet for Patients:  BloggerCourse.comhttps://www.fda.gov/media/152166/download  Fact Sheet for Healthcare Providers:  SeriousBroker.ithttps://www.fda.gov/media/152162/download  This test is no t yet approved or cleared by the [place] FDA and  has been authorized for detection and/or diagnosis of SARS-CoV-2 by FDA under an Emergency Use Authorization (EUA). This EUA will remain  in effect (meaning this test can be used) for the duration of the COVID-19 declaration under Section 564(b)(1) of the Act, 21 U.S.C.section 360bbb-3(b)(1), unless the authorization is terminated  or revoked sooner.       Influenza A by PCR NEGATIVE NEGATIVE  Final   Influenza B by PCR NEGATIVE NEGATIVE Final    Comment: (NOTE) The Xpert Xpress SARS-CoV-2/FLU/RSV plus assay is intended as an aid in the diagnosis of influenza from Nasopharyngeal swab specimens and should not be used as a sole basis for treatment. Nasal washings and aspirates are unacceptable for Xpert Xpress SARS-CoV-2/FLU/RSV testing.  Fact Sheet for Patients: BloggerCourse.com  Fact Sheet for Healthcare Providers: SeriousBroker.it  This test is not yet approved or cleared by the [place] FDA and has been authorized for detection and/or diagnosis of SARS-CoV-2 by FDA under an Emergency Use Authorization (EUA). This EUA will remain in effect (meaning this test can be used) for  the duration of the COVID-19 declaration under Section 564(b)(1) of the Act, 21 U.S.C. section 360bbb-3(b)(1), unless the authorization is terminated or revoked.  Performed at [facility], [address] [place], [place] [address]     RADIOLOGY STUDIES/RESULTS: DG Chest Port 1 View  Result Date: 12/21/2020 CLINICAL DATA:  Dyspnea EXAM: PORTABLE CHEST 1 VIEW COMPARISON:  12/15/2020 FINDINGS: Lungs are clear. No pneumothorax or pleural effusion. Mild to moderate cardiomegaly is stable. Pulmonary vascularity is normal. No acute bone abnormality. IMPRESSION: Stable cardiomegaly. Electronically Signed   By: Helyn Numbers MD   On: 12/21/2020 07:35     LOS: 7 days   Susa Raring, MD  [facility]  12/22/2020, 8:41 AM

## 2020-12-22 NOTE — Progress Notes (Signed)
Physical Therapy Treatment Patient Details Name: Daniel Butler MRN: [id number] DOB: Nov 16, 1965 Today's Date: 12/22/2020    History of Present Illness Daniel Butler is a 55 y.o. male presents from home after having a unwitnessed fall due to alcohol intoxication on 12/15/20. Additionally, pt found to have Afib with RVR, AKI, LE edema, and hyperkalemia.  Pt with normal cardiac cath on 12/19/20.  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 Today was pt's first day ambulating in hallway since admission. Pt/wife reports R knee meniscal tear. Pt reports pain with WB and attempting to keep knee as straight as possible to manage pain. Wife reports she plans to obtain a knee brace today. Pt in recliner with feet elevated at end of session.    Follow Up Recommendations  Outpatient PT;Supervision for mobility/OOB     Equipment Recommendations  None recommended by PT    Recommendations for Other Services       Precautions / Restrictions Precautions Precautions: Fall Precaution Comments: Dizziness with upright activity, no appreciable BP drop Restrictions Weight Bearing Restrictions: No    Mobility  Bed Mobility Overal bed mobility: Modified Independent             General bed mobility comments: +rail, increased time    Transfers Overall transfer level: Needs assistance Equipment used: Rolling walker (2 wheeled) Transfers: Sit to/from Stand Sit to Stand: Min assist;From elevated surface         General transfer comment: cues for hand placement, increased time to power up  Ambulation/Gait Ambulation/Gait assistance: Min assist Gait Distance (Feet): 200 Feet Assistive device: Rolling walker (2 wheeled) Gait Pattern/deviations: Step-through pattern;Antalgic;Decreased  stride length Gait velocity: decreased Gait velocity interpretation: 1.31 - 2.62 ft/sec, indicative of limited community ambulator General Gait Details: assist to maintain balance, cues for posture   Stairs Stairs:  (verbally educated on ascend/descend 3 steps at home entrance. Pt declined practice due to R knee pain. Wife plans to obtain knee brace today.)           Wheelchair Mobility    Modified Rankin (Stroke Patients Only)       Balance Overall balance assessment: Needs assistance Sitting-balance support: Feet supported;No upper extremity supported Sitting balance-Leahy Scale: Good Sitting balance - Comments: reports dizziness with initial sitting up, got better as he sat longer.   Standing balance support: Bilateral upper extremity supported;During functional activity;No upper extremity supported Standing balance-Leahy Scale: Fair Standing balance comment: static stand without UE support, RW for ambulation                            Cognition Arousal/Alertness: Awake/alert Behavior During Therapy: WFL for tasks assessed/performed Overall Cognitive Status: Within Functional Limits for tasks assessed Area of Impairment: Safety/judgement                         Safety/Judgement: Decreased awareness of safety;Decreased awareness of deficits     General Comments: continues to demonstrate decreased awareness of significance of dizziness and of safety,      Exercises Other Exercises Other Exercises: demonstrated and educated pt and wife on safe progression of how to get up after a fall    General Comments General comments (skin integrity, edema, etc.): Wife present and engaged in  session.      Pertinent Vitals/Pain Pain Assessment: Faces Faces Pain Scale: Hurts little more Pain Location: R knee due to meniscal injury Pain Descriptors / Indicators: Discomfort;Grimacing;Guarding Pain Intervention(s): Monitored during session;Repositioned     Home Living                      Prior Function            PT Goals (current goals can now be found in the care plan section) Acute Rehab PT Goals Patient Stated Goal: home Progress towards PT goals: Progressing toward goals    Frequency    Min 3X/week      PT Plan Current plan remains appropriate    Co-evaluation              AM-PAC PT "6 Clicks" Mobility   Outcome Measure  Help needed turning from your back to your side while in a flat bed without using bedrails?: None Help needed moving from lying on your back to sitting on the side of a flat bed without using bedrails?: None Help needed moving to and from a bed to a chair (including a wheelchair)?: A Little Help needed standing up from a chair using your arms (e.g., wheelchair or bedside chair)?: A Little Help needed to walk in hospital room?: A Little Help needed climbing 3-5 steps with a railing? : A Lot 6 Click Score: 19    End of Session Equipment Utilized During Treatment: Gait belt Activity Tolerance: Patient tolerated treatment well Patient left: in chair;with call bell/phone within reach;with family/visitor present Nurse Communication: Mobility status PT Visit Diagnosis: Unsteadiness on feet (R26.81);Other abnormalities of gait and mobility (R26.89);Difficulty in walking, not elsewhere classified (R26.2);Muscle weakness (generalized) (M62.81);History of falling (Z91.81)     Time: 2774-1287 PT Time Calculation (min) (ACUTE ONLY): 27 min  Charges:  $Gait Training: 23-37 mins                     Aida Raider, [place]  Office # [phone] Pager [phone]    Ilda Foil 12/22/2020, 11:58 AM

## 2020-12-23 MED ORDER — SACUBITRIL-VALSARTAN 24-26 MG PO TABS: 1.0000 | ORAL_TABLET | Freq: Two times a day (BID) | ORAL | 0 refills | Status: DC

## 2020-12-23 MED ORDER — DAPAGLIFLOZIN PROPANEDIOL 10 MG PO TABS: 10.0000 mg | ORAL_TABLET | Freq: Every day | ORAL | 0 refills | Status: DC

## 2020-12-23 MED ORDER — THIAMINE HCL 100 MG PO TABS: 100.0000 mg | ORAL_TABLET | Freq: Every day | ORAL | 0 refills | Status: DC

## 2020-12-23 MED ORDER — FOLIC ACID 1 MG PO TABS: 1.0000 mg | ORAL_TABLET | Freq: Every day | ORAL | 0 refills | Status: DC

## 2020-12-23 MED ORDER — METOPROLOL SUCCINATE ER 100 MG PO TB24: 100.0000 mg | ORAL_TABLET | Freq: Every day | ORAL | 0 refills | Status: DC

## 2020-12-23 MED ORDER — APIXABAN 5 MG PO TABS: 5.0000 mg | ORAL_TABLET | Freq: Two times a day (BID) | ORAL | 0 refills | Status: DC

## 2020-12-23 NOTE — Discharge Summary (Signed)
Daniel Butler ZXY:[id number] DOB: June 28, 1966 DOA: 12/15/2020  PCP: Olive Bass, FNP  Admit date: 12/15/2020  Discharge date: 12/23/2020  Admitted From: Home  Disposition:  Home   Recommendations for Outpatient Follow-up:   Follow up with PCP in 1-2 weeks  PCP Please obtain BMP/CBC, 2 view CXR in 1week,  (see Discharge instructions)   PCP Please follow up on the following pending results: Monitor CBC, CMP and two-view chest x-ray in 7 to 10 days.   Home Health:   PT, RN Equipment/Devices: walker  Consultations: Cards Discharge Condition: Stable    CODE STATUS: Full    Diet Recommendation: Heart Healthy with 1.5 Lit fluid/day  Diet Order            Diet Heart Room service appropriate? No; Fluid consistency: Thin  Diet effective now                  Chief Complaint  Patient presents with  . Altered Mental Status    Patient brought in by EMS from home. Patients wife called, he has been progressively getting "sick" over the last 2 weeks with leg swelling and heavy drinking. Very lethargic at this time, EMS states that they improved the patients color with some fluids.   . Alcohol Intoxication    5 beers and two shots of wild [place]   . Fall    C-collar placed at this time.      Brief history of present illness from the day of admission and additional interim summary    Patient is a 55 y.o. male significant past medical history of alcohol abuse, SAH-s/p craniotomy with clipping in 2016, HTN, HLD, depression/anxiety-who presented to the hospital in the setting of a mechanical fall due to alcohol intoxication.  In the ED-patient was evaluated by trauma services-and found to have no major injuries.  However he was found to have A. fib with RVR, significant lower extremity edema, AKI-started on  amiodarone infusion and admitted to the hospitalist service.  See below for further details.  Significant events: 4/8>> fall-alcohol intoxication-brought to ED-found to be in RVR/AKI/lower extremity edema.  Admit to [facility]  Significant studies: 4/8>> CT head: No intracranial abnormalities 4/8>> CT C-spine: No signs of cervical fracture or dislocation 4/8>> CT chest: No acute findings in the chest-nonspecific pulmonary nodules bilaterally 4/8>> CT abdomen/pelvis: No acute injury/acute findings 4/8>> EtOH level: 237 4/8>> acute hepatitis serology: Negative 4/8>> TSH: 2.37 4/10> TTE - 1. Left ventricular ejection fraction, by estimation, is 30 to 35%. The left ventricle has moderately decreased function. The left ventricle demonstrates global hypokinesis. Left ventricular diastolic function could not be evaluated.  2. Right ventricular systolic function is normal. The right ventricular size is normal. There is normal pulmonary artery systolic pressure.  3. Right atrial size was mildly dilated.  4. The mitral valve is normal in structure. No evidence of mitral valve regurgitation.  5. The aortic valve is normal in structure. Aortic valve regurgitation is not visualized  Hospital Course    Mechanical fall: Likely due to alcohol intoxication and use of Xanax-await  PT/OT eval.  Imaging negative for fractures/acute injuries.  Per spouse-has had multiple falls over the past 2 weeks.  Now much improved after alcohol intoxication and brief DTs have resolved.  Eager to go home and currently symptom-free.  A. fib with RVR: Stable TSH, echo noted, cardiology on board.  [place] vas 2 score of at least 1, per Cards placed on Eliquis.  He was initially on amiodarone now better rate controlled on low-dose beta-blocker.  Post discharge follow-up with cardiology.  Newly diagnosed Acute on chronic systolic heart failure EF 30% with hypokinesis.  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  Volume status is compensated.  Close monitoring of blood pressure, BMP and volume status by PCP and cardiology in the outpatient setting.  Acute toxic metabolic encephalopathy/ now ? DTs: Due to alcohol intoxication-and and then mild DTs was also Librium + CIWA protocol.   Last drink was 4/8.   Mild DTs completely resolved.  Extensively counseled to quit.  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  PCP to monitor CMP, fluid status and Entresto dose closely in the outpatient setting.,  Mild  Hypokalemia.  Replaced.  ?  Dysphagia: Due to encephalopathy, speech on board.  Resolved.  Lactic acidosis: Due to hypoperfusion in the setting of hypotension-no indication of sepsis.  Dehydration resolved after IV fluids.  Mild transaminitis: Probably mild alcoholic hepatitis-acute hepatitis serology negative.  No signs of cirrhosis on imaging.  HLD: Remains on fenofibrate, Vascepa, Crestor.  HTN: BP stable-B Blocker dose adjusted.  Bilateral pulmonary nodules: Per radiology - repeat CT chest in 12 months  Debility/deconditioning/multiple falls: PT-OT  Morbid Obesity: Estimated body mass index is 37.14 kg/m as calculated from the following:   Height as of this encounter: 6\' 2"  (1.88 m).   Weight as of this encounter: 131.2 kg.    Discharge diagnosis     Principal Problem:   Alcohol abuse with intoxication (HCC) Active Problems:   Transient hypotension   Acute metabolic encephalopathy   AKI (acute kidney injury) (HCC)   History of subarachnoid hemorrhage   Hypoalbuminemia   Atrial fibrillation (HCC)   Bilateral lower extremity edema   Fall  at home, initial encounter   Acute combined systolic and diastolic heart failure [facility])    Discharge instructions    Discharge Instructions    Amb referral to AFIB Clinic   Complete by: As directed    Ambulatory referral to Physical Therapy   Complete by: As directed    Vestibular PT   Discharge instructions   Complete by: As directed    Follow with Primary MD [facility], FNP in 7 days   Get CBC, CMP, 2 view Chest X ray -  checked next visit within 1 week by Primary MD   Activity: As tolerated with Full fall precautions use walker/cane & assistance as needed  Disposition Home    Diet:  Heart Healthy with 1.5 L/day total fluid restriction  Check your Weight same time everyday, if you gain over 2 pounds, or you develop in leg swelling, experience more shortness of breath or chest pain, call your Primary MD immediately. Follow  Cardiac Low Salt Diet and 1.5 lit/day fluid restriction.  Special Instructions: If you have smoked or chewed Tobacco  in the last 2 yrs please stop smoking, stop any regular Alcohol  and or any Recreational drug use.  On your next visit with your primary care physician please Get Medicines reviewed and adjusted.  Please request your Prim.MD to go over all Hospital Tests and Procedure/Radiological results at the follow up, please get all Hospital records sent to your Prim MD by signing hospital release before you go home.  If you experience worsening of your admission symptoms, develop shortness of breath, life threatening emergency, suicidal or homicidal thoughts you must seek medical attention immediately by calling 911 or calling your MD immediately  if symptoms less severe.  You Must read complete instructions/literature along with all the possible adverse reactions/side effects for all the Medicines you take and that have been prescribed to you. Take any new Medicines after you have completely understood and accpet all the possible adverse  reactions/side effects.   Increase activity slowly   Complete by: As directed    No wound care   Complete by: As directed       Discharge Medications   Allergies as of 12/23/2020      Reactions   Zetia [ezetimibe] Nausea And Vomiting      Medication List    STOP taking these medications   ibuprofen 200 MG tablet Commonly known as: ADVIL   losartan 100 MG tablet Commonly known as: COZAAR     TAKE these medications   allopurinol 100 MG tablet Commonly known as: ZYLOPRIM Take 1 tablet (100 mg total) by mouth daily.   ALPRAZolam 0.5 MG tablet Commonly known as: XANAX TAKE 1 TABLET BY MOUTH THREE TIMES A DAY AS NEEDED FOR ANXIETY DUE 11-14-2020 What changed:   how much to take  how to take this  when to take this  reasons to take this  additional instructions   apixaban 5 MG Tabs tablet Commonly known as: ELIQUIS Take 1 tablet (5 mg total) by mouth 2 (two) times daily.   dapagliflozin propanediol 10 MG Tabs tablet Commonly known as: FARXIGA Take 1 tablet (10 mg total) by mouth daily. Start taking on: December 24, 2020   fenofibrate 145 MG tablet Commonly known as: TRICOR Take 1 tablet (145 mg total) by mouth daily.   folic acid 1 MG tablet Commonly known as: FOLVITE Take 1 tablet (1 mg total) by mouth daily. Start taking on: December 24, 2020   icosapent Ethyl 1 g capsule Commonly known as: Vascepa Take 2 capsules (2 g total) by mouth 2 (two) times daily.   metoprolol succinate 100 MG 24 hr tablet Commonly known as: TOPROL-XL Take 1 tablet (100 mg total) by mouth daily. Take with or immediately following a meal. Start taking on: December 24, 2020   rosuvastatin 40 MG tablet Commonly known as: CRESTOR TAKE 1 TABLET BY MOUTH ONCE DAILY. PATIENT NEEDS OV FOR FURTHER REFILLS What changed:   how much to take  how to take this  when to take this  additional instructions   sacubitril-valsartan 24-26 MG Commonly known as: ENTRESTO Take 1 tablet by mouth 2  (two) times daily.   thiamine 100 MG tablet Take 1 tablet (100 mg total) by mouth daily. Start taking on: December 24, 2020            Durable Medical Equipment  (From admission, onward)         Start  Ordered   12/21/20 0654  For home use only DME Walker rolling  Once       Comments: 5 wheel  Question Answer Comment  Walker: With 5 Inch Wheels   Patient needs a walker to treat with the following condition Weakness      12/21/20 0654           Follow-up Information    [facility] Follow [facility].   Specialty: Rehabilitation Why: Follow up with outpatient physical therapy Contact information: [address] [id number] [address]c [place] [id number] [phone]       Ronney Astersleaver, Jesse M, NP Follow up.   Specialty: Cardiology Why: Hospital follow-up with Cardiology scheduled for 01/15/2021 at 11:15am. Please arrive 15 minutues early for check-in. If this date/time does not work for you, please call our office to reschedule. Contact information: [address] [place] [address] [phone]        Olive BassMurray, Laura Woodruff, FNP. Schedule an appointment as soon as possible for a visit in 1 week(s).   Specialty: Internal Medicine Why: Please get future supplies of Clifton CustardEntresto, Farxiga and Eliquis addressed Contact information: [address] [place] [address] [phone]        Chrystie NoseHilty, Kenneth C, MD. Schedule an appointment as soon as possible for a visit in 1 week(s).   Specialty: Cardiology Contact information: [address] [place] [address] [phone]               Major procedures and Radiology Reports - PLEASE review detailed and final reports thoroughly  -       CT ABDOMEN PELVIS WO CONTRAST  Result Date: 12/15/2020 CLINICAL DATA:  Status post fall. EXAM: CT CHEST, ABDOMEN AND PELVIS WITHOUT CONTRAST TECHNIQUE: Multidetector CT imaging of the chest,  abdomen and pelvis was performed following the standard protocol without IV contrast. COMPARISON:  None. FINDINGS: CT CHEST FINDINGS Cardiovascular: Heart size normal. No pericardial effusion. Coronary artery atherosclerotic calcifications noted. Mediastinum/Nodes: No enlarged mediastinal, hilar, or axillary lymph nodes. Thyroid gland, trachea, and esophagus demonstrate no significant findings. Lungs/Pleura: No pleural effusion. No airspace consolidation, atelectasis, or pneumothorax. Multiple scattered tiny lung nodules are identified throughout both lungs. These have a nonspecific appearance including: -Nodule within the left upper lobe measures 5 mm, image 39/3. -central right middle lobe lung nodule measures 5 mm, image 96/3. -Right lower lobe lung nodule measures 5 mm, image 96/3. -within the central left lower lobe there is a 5 mm lung nodule, image 99/3. Musculoskeletal: No chest wall mass or suspicious bone lesions identified. CT ABDOMEN PELVIS FINDINGS Hepatobiliary: No focal liver abnormality is seen. No gallstones, gallbladder wall thickening, or biliary dilatation. Pancreas: Unremarkable. No pancreatic ductal dilatation or surrounding inflammatory changes. Spleen: Normal in size without focal abnormality. Adrenals/Urinary Tract: Adrenal glands are unremarkable. Kidneys are normal, without renal calculi, focal lesion, or hydronephrosis. Bladder is unremarkable. Stomach/Bowel: Stomach is within normal limits. Appendix appears normal. No evidence of bowel wall thickening, distention, or inflammatory changes. Vascular/Lymphatic: Aortic atherosclerosis. No enlarged abdominal or pelvic lymph nodes. Reproductive: Prostate is unremarkable. Other: No free fluid or fluid collections. Fat containing umbilical hernia noted. There is also a small fat containing left inguinal hernia. Musculoskeletal: Spondylosis identified within the lumbar spine. This is most advanced at L5-S1. No acute or suspicious osseous  findings. IMPRESSION: 1. No acute findings within the chest, abdomen or pelvis. 2. Small nonspecific pulmonary nodules are identified bilaterally. No follow-up needed if patient is low-risk (  and has no known or suspected primary neoplasm). Non-contrast chest CT can be considered in 12 months if patient is high-risk. This recommendation follows the consensus statement: Guidelines for Management of Incidental Pulmonary Nodules Detected on CT Images: From the Fleischner Society 2017; Radiology 2017; 284:228-243. 3. Aortic Atherosclerosis (ICD10-I70.0). Coronary artery calcifications. Electronically Signed   By: Signa Kell M.D.   On: 12/15/2020 14:23   CT Head Wo Contrast  Result Date: 12/15/2020 CLINICAL DATA:  Mental status change fall. EXAM: CT HEAD WITHOUT CONTRAST CT CERVICAL SPINE WITHOUT CONTRAST TECHNIQUE: Multidetector CT imaging of the head and cervical spine was performed following the standard protocol without intravenous contrast. Multiplanar CT image reconstructions of the cervical spine were also generated. COMPARISON:  CT head 03/10/15 FINDINGS: CT HEAD FINDINGS Brain: No evidence of acute infarction, hemorrhage, hydrocephalus, extra-axial collection or mass lesion/mass effect. There is mild diffuse low-attenuation within the subcortical and periventricular white matter compatible with chronic microvascular disease. Right frontal lobe encephalomalacia identified compatible with remote infarct. Vascular: No hyperdense vessel or unexpected calcification. Status post right MCA territory aneurysm clipping Skull: Changes from remote right frontal craniotomy noted. Sinuses/Orbits: There is the coastal thickening involving the sphenoid sinus and anterior ethmoid air cells as well as the frontal sinuses. Other: None CT CERVICAL SPINE FINDINGS Alignment: Normal. Skull base and vertebrae: No acute fracture. No primary bone lesion or focal pathologic process. Soft tissues and spinal canal: No prevertebral  fluid or swelling. No visible canal hematoma. Disc levels:  Within normal limits. Upper chest: Negative Other: None IMPRESSION: 1. No acute intracranial abnormalities. 2. Chronic small vessel ischemic change. 3. 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 4. No signs of cervical spine fracture or dislocation. 5. Paranasal sinus inflammation. Electronically Signed   By: Signa Kell M.D.   On: 12/15/2020 14:13   CT Chest Wo Contrast  Result Date: 12/15/2020 CLINICAL DATA:  Status post fall. EXAM: CT CHEST, ABDOMEN AND PELVIS WITHOUT CONTRAST TECHNIQUE: Multidetector CT imaging of the chest, abdomen and pelvis was performed following the standard protocol without IV contrast. COMPARISON:  None. FINDINGS: CT CHEST FINDINGS Cardiovascular: Heart size normal. No pericardial effusion. Coronary artery atherosclerotic calcifications noted. Mediastinum/Nodes: No enlarged mediastinal, hilar, or axillary lymph nodes. Thyroid gland, trachea, and esophagus demonstrate no significant findings. Lungs/Pleura: No pleural effusion. No airspace consolidation, atelectasis, or pneumothorax. Multiple scattered tiny lung nodules are identified throughout both lungs. These have a nonspecific appearance including: -Nodule within the left upper lobe measures 5 mm, image 39/3. -central right middle lobe lung nodule measures 5 mm, image 96/3. -Right lower lobe lung nodule measures 5 mm, image 96/3. -within the central left lower lobe there is a 5 mm lung nodule, image 99/3. Musculoskeletal: No chest wall mass or suspicious bone lesions identified. CT ABDOMEN PELVIS FINDINGS Hepatobiliary: No focal liver abnormality is seen. No gallstones, gallbladder wall thickening, or biliary dilatation. Pancreas: Unremarkable. No pancreatic ductal dilatation or surrounding inflammatory changes. Spleen: Normal in size without focal abnormality. Adrenals/Urinary Tract: Adrenal glands are unremarkable. Kidneys are  normal, without renal calculi, focal lesion, or hydronephrosis. Bladder is unremarkable. Stomach/Bowel: Stomach is within normal limits. Appendix appears normal. No evidence of bowel wall thickening, distention, or inflammatory changes. Vascular/Lymphatic: Aortic atherosclerosis. No enlarged abdominal or pelvic lymph nodes. Reproductive: Prostate is unremarkable. Other: No free fluid or fluid collections. Fat containing umbilical hernia noted. There is also a small fat containing left inguinal hernia. Musculoskeletal: Spondylosis identified within the lumbar spine. This is most  advanced at L5-S1. No acute or suspicious osseous findings. IMPRESSION: 1. No acute findings within the chest, abdomen or pelvis. 2. Small nonspecific pulmonary nodules are identified bilaterally. No follow-up needed if patient is low-risk (and has no known or suspected primary neoplasm). Non-contrast chest CT can be considered in 12 months if patient is high-risk. This recommendation follows the consensus statement: Guidelines for Management of Incidental Pulmonary Nodules Detected on CT Images: From the Fleischner Society 2017; Radiology 2017; 284:228-243. 3. Aortic Atherosclerosis (ICD10-I70.0). Coronary artery calcifications. Electronically Signed   By: Signa Kell M.D.   On: 12/15/2020 14:23   CT Cervical Spine Wo Contrast  Result Date: 12/15/2020 CLINICAL DATA:  Mental status change fall. EXAM: CT HEAD WITHOUT CONTRAST CT CERVICAL SPINE WITHOUT CONTRAST TECHNIQUE: Multidetector CT imaging of the head and cervical spine was performed following the standard protocol without intravenous contrast. Multiplanar CT image reconstructions of the cervical spine were also generated. COMPARISON:  CT head 03/10/15 FINDINGS: CT HEAD FINDINGS Brain: No evidence of acute infarction, hemorrhage, hydrocephalus, extra-axial collection or mass lesion/mass effect. There is mild diffuse low-attenuation within the subcortical and periventricular white  matter compatible with chronic microvascular disease. Right frontal lobe encephalomalacia identified compatible with remote infarct. Vascular: No hyperdense vessel or unexpected calcification. Status post right MCA territory aneurysm clipping Skull: Changes from remote right frontal craniotomy noted. Sinuses/Orbits: There is the coastal thickening involving the sphenoid sinus and anterior ethmoid air cells as well as the frontal sinuses. Other: None CT CERVICAL SPINE FINDINGS Alignment: Normal. Skull base and vertebrae: No acute fracture. No primary bone lesion or focal pathologic process. Soft tissues and spinal canal: No prevertebral fluid or swelling. No visible canal hematoma. Disc levels:  Within normal limits. Upper chest: Negative Other: None IMPRESSION: 1. No acute intracranial abnormalities. 2. Chronic small vessel ischemic change. 3. 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 4. No signs of cervical spine fracture or dislocation. 5. Paranasal sinus inflammation. Electronically Signed   By: Signa Kell M.D.   On: 12/15/2020 14:13   CARDIAC CATHETERIZATION  Result Date: 12/19/2020  There is moderate left ventricular systolic dysfunction.  LV end diastolic pressure is normal.  The left ventricular ejection fraction is 35-45% by visual estimate.  Hemodynamic findings consistent with mild pulmonary hypertension.  1. Normal coronary anatomy 2. Moderate LV dysfunction. EF estimated at 40% 3. Normal LV filling pressures 4. Mild pulmonary HTN. Mean PAP 24 mm Hg 5. Reduced cardiac output. Co ox 63%. Cardiac index 1.89 Plan: medical management. OK to start oral anticoagulation this pm.   DG Pelvis Portable  Result Date: 12/15/2020 CLINICAL DATA:  Level 1 trauma, confusion, fall EXAM: PORTABLE PELVIS 1-2 VIEWS COMPARISON:  Portable exam 1328 hours without priors for comparison FINDINGS: Osseous mineralization normal for technique. Hip and SI joint spaces symmetric. No  fracture, dislocation, or bone destruction. IMPRESSION: No acute abnormalities. Electronically Signed   By: Ulyses Southward M.D.   On: 12/15/2020 13:56   DG Chest Port 1 View  Result Date: 12/21/2020 CLINICAL DATA:  Dyspnea EXAM: PORTABLE CHEST 1 VIEW COMPARISON:  12/15/2020 FINDINGS: Lungs are clear. No pneumothorax or pleural effusion. Mild to moderate cardiomegaly is stable. Pulmonary vascularity is normal. No acute bone abnormality. IMPRESSION: Stable cardiomegaly. Electronically Signed   By: Helyn Numbers MD   On: 12/21/2020 07:35   DG Chest Port 1 View  Result Date: 12/15/2020 CLINICAL DATA:  Acute respiratory distress. EXAM: PORTABLE CHEST 1 VIEW COMPARISON:  Earlier film, same  date. FINDINGS: Stable cardiac enlargement. No acute pulmonary findings. No pleural effusions pulmonary lesions. The bony thorax is intact. IMPRESSION: Cardiac enlargement but no acute pulmonary findings. Electronically Signed   By: Rudie Meyer M.D.   On: 12/15/2020 21:49   DG Chest Port 1 View  Result Date: 12/15/2020 CLINICAL DATA:  Level 1 trauma, confusion, fall, RIGHT-side chest pain EXAM: PORTABLE CHEST 1 VIEW COMPARISON:  Portable exam 1326 hours compared to 03/03/2015 FINDINGS: Minimal enlargement of cardiac silhouette. Mediastinal contours and pulmonary vascularity normal. Lungs clear. No pulmonary infiltrate, pleural effusion or pneumothorax. No acute osseous findings. IMPRESSION: Minimal enlargement of cardiac silhouette. No acute abnormalities. Electronically Signed   By: Ulyses Southward M.D.   On: 12/15/2020 13:55   ECHOCARDIOGRAM COMPLETE  Result Date: 12/17/2020    ECHOCARDIOGRAM REPORT   Patient Name:   OBADIAH DENNARD Date of Exam: 12/17/2020 Medical Rec #:  [id number]           Height:       74.0 in Accession #:    [id number]          Weight:       290.6 lb Date of Birth:  1965-11-22           BSA:          2.547 m Patient Age:    54 years            BP:           115/79 mmHg Patient Gender: M                    HR:           87 bpm. Exam Location:  Inpatient Procedure: 2D Echo Indications:    atrial fibrillation  History:        Patient has no prior history of Echocardiogram examinations.                 Signs/Symptoms:leg edema; Risk Factors:Hypertension, Current                 Smoker and ETOH abuse.  Sonographer:    Delcie Roch Referring Phys: [id number] RONDELL A SMITH IMPRESSIONS  1. Left ventricular ejection fraction, by estimation, is 30 to 35%. The left ventricle has moderately decreased function. The left ventricle demonstrates global hypokinesis. Left ventricular diastolic function could not be evaluated.  2. Right ventricular systolic function is normal. The right ventricular size is normal. There is normal pulmonary artery systolic pressure.  3. Right atrial size was mildly dilated.  4. The mitral valve is normal in structure. No evidence of mitral valve regurgitation.  5. The aortic valve is normal in structure. Aortic valve regurgitation is not visualized. FINDINGS  Left Ventricle: Left ventricular ejection fraction, by estimation, is 30 to 35%. The left ventricle has moderately decreased function. The left ventricle demonstrates global hypokinesis. The left ventricular internal cavity size was normal in size. There is no left ventricular hypertrophy. Left ventricular diastolic function could not be evaluated due to atrial fibrillation. Left ventricular diastolic function could not be evaluated. Right Ventricle: The right ventricular size is normal. No increase in right ventricular wall thickness. Right ventricular systolic function is normal. There is normal pulmonary artery systolic pressure. The tricuspid regurgitant velocity is 1.94 m/s, and  with an assumed right atrial pressure of 8 mmHg, the estimated right ventricular systolic pressure is 23.1 mmHg. Left Atrium: Left atrial size was normal in size. Right Atrium: Right atrial  size was mildly dilated. Pericardium: There is no evidence of  pericardial effusion. Mitral Valve: The mitral valve is normal in structure. No evidence of mitral valve regurgitation. Tricuspid Valve: The tricuspid valve is grossly normal. Tricuspid valve regurgitation is trivial. Aortic Valve: The aortic valve is normal in structure. Aortic valve regurgitation is not visualized. Pulmonic Valve: The pulmonic valve was normal in structure. Pulmonic valve regurgitation is not visualized. Aorta: The aortic root and ascending aorta are structurally normal, with no evidence of dilitation. IAS/Shunts: The atrial septum is grossly normal.  LEFT VENTRICLE PLAX 2D LVIDd:         5.30 cm LVIDs:         3.90 cm LV PW:         1.00 cm LV IVS:        1.10 cm LVOT diam:     2.40 cm LVOT Area:     4.52 cm  IVC IVC diam: 2.30 cm LEFT ATRIUM           Index       RIGHT ATRIUM           Index LA diam:      4.20 cm 1.65 cm/m  RA Area:     18.20 cm LA Vol (A2C): 74.7 ml 29.32 ml/m RA Volume:   50.90 ml  19.98 ml/m   AORTA Ao Root diam: 4.30 cm Ao Asc diam:  3.60 cm TRICUSPID VALVE TR Peak grad:   15.1 mmHg TR Vmax:        194.00 cm/s  SHUNTS Systemic Diam: 2.40 cm Kristeen Miss MD Electronically signed by Kristeen Miss MD Signature Date/Time: 12/17/2020/4:17:22 PM    Final     Micro Results     Recent Results (from the past 240 hour(s))  Blood culture (routine single)     Status: None   Collection Time: 12/15/20  1:20 PM   Specimen: BLOOD  Result Value Ref Range Status   Specimen Description BLOOD SITE NOT SPECIFIED  Final   Special Requests   Final    BOTTLES DRAWN AEROBIC AND ANAEROBIC Blood Culture results may not be optimal due to an inadequate volume of blood received in culture bottles   Culture   Final    NO GROWTH 5 DAYS Performed at [facility], [address] [place], [place] [address]    Report Status 12/20/2020 FINAL  Final  Urine culture     Status: None   Collection Time: 12/15/20  1:20 PM   Specimen: In/Out Cath Urine  Result Value Ref Range Status    Specimen Description IN/OUT CATH URINE  Final   Special Requests NONE  Final   Culture   Final    NO GROWTH Performed at [facility], [address], [place], [place] [address]    Report Status 12/16/2020 FINAL  Final  Resp Panel by RT-PCR (Flu A&B, Covid) Nasopharyngeal Swab     Status: None   Collection Time: 12/15/20  2:17 PM   Specimen: Nasopharyngeal Swab; Nasopharyngeal(NP) swabs in vial transport medium  Result Value Ref Range Status   SARS Coronavirus 2 by RT PCR NEGATIVE NEGATIVE Final    Comment: (NOTE) SARS-CoV-2 target nucleic acids are NOT DETECTED.  The SARS-CoV-2 RNA is generally detectable in upper respiratory specimens during the acute phase of infection. The lowest concentration of SARS-CoV-2 viral copies this assay can detect is 138 copies/mL. A negative result does not preclude SARS-Cov-2 infection and should not be  used as the sole basis for treatment or other patient management decisions. A negative result may occur with  improper specimen collection/handling, submission of specimen other than nasopharyngeal swab, presence of viral mutation(s) within the areas targeted by this assay, and inadequate number of viral copies(<138 copies/mL). A negative result must be combined with clinical observations, patient history, and epidemiological information. The expected result is Negative.  Fact Sheet for Patients:  BloggerCourse.com  Fact Sheet for Healthcare Providers:  SeriousBroker.it  This test is no t yet approved or cleared by the [place] FDA and  has been authorized for detection and/or diagnosis of SARS-CoV-2 by FDA under an Emergency Use Authorization (EUA). This EUA will remain  in effect (meaning this test can be used) for the duration of the COVID-19 declaration under Section 564(b)(1) of the Act, 21 U.S.C.section 360bbb-3(b)(1), unless the authorization is terminated  or revoked sooner.        Influenza A by PCR NEGATIVE NEGATIVE Final   Influenza B by PCR NEGATIVE NEGATIVE Final    Comment: (NOTE) The Xpert Xpress SARS-CoV-2/FLU/RSV plus assay is intended as an aid in the diagnosis of influenza from Nasopharyngeal swab specimens and should not be used as a sole basis for treatment. Nasal washings and aspirates are unacceptable for Xpert Xpress SARS-CoV-2/FLU/RSV testing.  Fact Sheet for Patients: BloggerCourse.com  Fact Sheet for Healthcare Providers: SeriousBroker.it  This test is not yet approved or cleared by the [place] FDA and has been authorized for detection and/or diagnosis of SARS-CoV-2 by FDA under an Emergency Use Authorization (EUA). This EUA will remain in effect (meaning this test can be used) for the duration of the COVID-19 declaration under Section 564(b)(1) of the Act, 21 U.S.C. section 360bbb-3(b)(1), unless the authorization is terminated or revoked.  Performed at [facility], [address], [place], [place] [address]     Today   Subjective    Marsha Hillman today has no headache,no chest abdominal pain,no new weakness tingling or numbness, feels much better wants to go home today.     Objective   Blood pressure 112/84, pulse 82, temperature 98.2 F (36.8 C), temperature source Oral, resp. rate 16, height 6\' 2"  (1.88 m), weight 126.2 kg, SpO2 94 %.   Intake/Output Summary (Last 24 hours) at 12/23/2020 1000 Last data filed at 12/23/2020 0531 Gross per 24 hour  Intake 300 ml  Output 1875 ml  Net -1575 ml    Exam  Awake Alert, No new F.N deficits, Normal affect [place].AT,PERRAL Supple Neck,No JVD, No cervical lymphadenopathy appriciated.  Symmetrical Chest wall movement, Good air movement bilaterally, CTAB RRR,No Gallops,Rubs or new Murmurs, No Parasternal Heave +ve B.Sounds, Abd Soft, Non tender, No organomegaly appriciated, No rebound -guarding or rigidity. No  Cyanosis, Clubbing or edema, No new Rash or bruise    Data Review   CBC w Diff:  Lab Results  Component Value Date   WBC 7.9 12/21/2020   HGB 14.7 12/21/2020   HCT 43.6 12/21/2020   PLT 233 12/21/2020   LYMPHOPCT 23 12/21/2020   MONOPCT 7 12/21/2020   EOSPCT 2 12/21/2020   BASOPCT 1 12/21/2020    CMP:  Lab Results  Component Value Date   NA 136 12/22/2020   K 4.0 12/22/2020   CL 108 12/22/2020   CO2 22 12/22/2020   BUN 16 12/22/2020   CREATININE 1.41 (H) 12/22/2020   PROT 5.6 (L) 12/21/2020   ALBUMIN 2.8 (L) 12/21/2020   BILITOT 0.8 12/21/2020   ALKPHOS  27 (L) 12/21/2020   AST 41 12/21/2020   ALT 26 12/21/2020  .   Total Time in preparing paper work, data evaluation and todays exam - 35 minutes  Susa Raring M.D on 12/23/2020 at 10:00 AM  [facility]

## 2020-12-23 NOTE — Progress Notes (Signed)
AVS provided and reviewed with pt. All questions answered. Pt discharged home with wife. Belonging sent with pt.

## 2020-12-23 NOTE — Care Management (Signed)
1151 12-23-20 Case Manager provided patient with the 30 day free Entresto and Eliquis card. Unable to provide a [place] card to the patient. Case Manager did make patient aware that if he cannot afford to call cardiology to see if they have samples or a discount card available. Wife will call the Cardiology office on Monday. No further needs identified at this time. Gala Lewandowsky, RN,BSN Case Manager

## 2020-12-23 NOTE — Progress Notes (Signed)
PT Cancellation Note  Patient Details Name: Daniel Butler MRN: [id number] DOB: 1966-05-17   Cancelled Treatment:    Reason Eval/Treat Not Completed: Other (comment)   Patient being discharged home today. Offered to take pt to practice up/down steps prior to discharge. Both patient and wife felt he did not need to practice. Wife reported a neighbor is coming to help in case pt needs it.    Jerolyn Center, PT Pager [phone]    Zena Amos 12/23/2020, 11:19 AM

## 2020-12-25 ENCOUNTER — Telehealth: Payer: Self-pay | Admitting: Internal Medicine

## 2020-12-25 NOTE — Telephone Encounter (Signed)
Patient calling the office for samples of medication:   1.  What medication and dosage are you requesting samples for? Farxiga 10 mg  2.  Are you currently out of this medication? Patient was in the hospital this past week and they told them to call concering some medication. To see if they can get a coupon as well.

## 2020-12-25 NOTE — Telephone Encounter (Signed)
Spoke with pt's wife concerning samples of farxiga. As well as a 30 day free card.   Medication samples have been provided to the patient.  Drug name: Marcelline Deist 10mg   Qty: 3 boxes  LOT: Exp.Date: 04/09/23  Samples left at front desk for patient pick-up. Patient notified.

## 2020-12-25 NOTE — Telephone Encounter (Signed)
Follow up:     Patient retuning a call to check the status. Please advise.

## 2021-01-01 ENCOUNTER — Ambulatory Visit: Payer: Self-pay | Admitting: Neurology

## 2021-01-02 ENCOUNTER — Encounter: Payer: Self-pay | Admitting: Family

## 2021-01-02 ENCOUNTER — Other Ambulatory Visit: Payer: Self-pay

## 2021-01-02 ENCOUNTER — Ambulatory Visit (INDEPENDENT_AMBULATORY_CARE_PROVIDER_SITE_OTHER): Payer: Self-pay | Admitting: Family

## 2021-01-02 ENCOUNTER — Ambulatory Visit (HOSPITAL_BASED_OUTPATIENT_CLINIC_OR_DEPARTMENT_OTHER)
Admission: RE | Admit: 2021-01-02 | Discharge: 2021-01-02 | Disposition: A | Discharge status: Home / Self Care | Payer: Self-pay | Source: Ambulatory Visit | Attending: Family | Admitting: Family

## 2021-01-02 VITALS — Ht 73.0 in | Wt 273.8 lb

## 2021-01-02 DIAGNOSIS — I509 Heart failure, unspecified: Secondary | ICD-10-CM | POA: Insufficient documentation

## 2021-01-02 DIAGNOSIS — I4819 Other persistent atrial fibrillation: Secondary | ICD-10-CM

## 2021-01-02 LAB — COMPREHENSIVE METABOLIC PANEL
ALT: 19 U/L (ref 0–53)
AST: 28 U/L (ref 0–37)
Albumin: 4.2 g/dL (ref 3.5–5.2)
Alkaline Phosphatase: 30 U/L — ABNORMAL LOW (ref 39–117)
BUN: 23 mg/dL (ref 6–23)
CO2: 20 mEq/L (ref 19–32)
Calcium: 9.8 mg/dL (ref 8.4–10.5)
Chloride: 105 mEq/L (ref 96–112)
Creatinine, Ser: 1.5 mg/dL (ref 0.40–1.50)
GFR: 52.02 mL/min — ABNORMAL LOW (ref >60.00)
Glucose, Bld: 88 mg/dL (ref 70–99)
Potassium: 4.9 mEq/L (ref 3.5–5.1)
Sodium: 135 mEq/L (ref 135–145)
Total Bilirubin: 0.8 mg/dL (ref 0.2–1.2)
Total Protein: 6.9 g/dL (ref 6.0–8.3)

## 2021-01-02 LAB — CBC WITH DIFFERENTIAL/PLATELET
Basophils Absolute: 0.1 10*3/uL (ref 0.0–0.1)
Basophils Relative: 1.1 % (ref 0.0–3.0)
Eosinophils Absolute: 0.2 10*3/uL (ref 0.0–0.7)
Eosinophils Relative: 2.3 % (ref 0.0–5.0)
HCT: 52.9 % — ABNORMAL HIGH (ref 39.0–52.0)
Hemoglobin: 17.3 g/dL — ABNORMAL HIGH (ref 13.0–17.0)
Lymphocytes Relative: 21 % (ref 12.0–46.0)
Lymphs Abs: 2 10*3/uL (ref 0.7–4.0)
MCHC: 32.8 g/dL (ref 30.0–36.0)
MCV: 104.4 fl — ABNORMAL HIGH (ref 78.0–100.0)
Monocytes Absolute: 0.6 10*3/uL (ref 0.1–1.0)
Monocytes Relative: 6.3 % (ref 3.0–12.0)
Neutro Abs: 6.5 10*3/uL (ref 1.4–7.7)
Neutrophils Relative %: 69.3 % (ref 43.0–77.0)
Platelets: 370 10*3/uL (ref 150.0–400.0)
RBC: 5.07 Mil/uL (ref 4.22–5.81)
RDW: 15 % (ref 11.5–15.5)
WBC: 9.4 10*3/uL (ref 4.0–10.5)

## 2021-01-02 MED ORDER — FOLIC ACID 1 MG PO TABS: 1.0000 mg | ORAL_TABLET | Freq: Every day | ORAL | 0 refills | Status: DC

## 2021-01-02 MED ORDER — SACUBITRIL-VALSARTAN 24-26 MG PO TABS: 1.0000 | ORAL_TABLET | Freq: Two times a day (BID) | ORAL | 3 refills | Status: DC

## 2021-01-02 MED ORDER — APIXABAN 5 MG PO TABS: 5.0000 mg | ORAL_TABLET | Freq: Two times a day (BID) | ORAL | 3 refills | Status: DC

## 2021-01-02 MED ORDER — METOPROLOL SUCCINATE ER 100 MG PO TB24: 100.0000 mg | ORAL_TABLET | Freq: Every day | ORAL | 0 refills | Status: DC

## 2021-01-02 NOTE — Progress Notes (Signed)
Daniel Butler is a 55 y.o. male with the following history as recorded in EpicCare:  Patient Active Problem List   Diagnosis Date Noted  . Acute combined systolic and diastolic heart failure ([place])   . Alcohol abuse with intoxication ([place]) 12/15/2020  . Transient hypotension 12/15/2020  . Acute metabolic encephalopathy 03/88/8280  . AKI (acute kidney injury) ([place]) 12/15/2020  . History of subarachnoid hemorrhage 12/15/2020  . Hypoalbuminemia 12/15/2020  . Atrial fibrillation ([place]) 12/15/2020  . Bilateral lower extremity edema 12/15/2020  . Fall at home, initial encounter 12/15/2020  . Alcohol abuse 06/02/2018  . Tobacco abuse 06/02/2018  . Hypertriglyceridemia 01/19/2018  . Essential hypertension 10/02/2017  . Sciatica of right side 04/11/2017  . Sleep disturbance 05/19/2015  . Anxiety state 05/19/2015  . Generalized headaches 04/18/2015  . Gout 04/18/2015  . Encounter for orogastric (OG) tube placement   . Subarachnoid hemorrhage ([place]) 03/02/2015  . Acute respiratory failure with hypoxia ([place])   . SAH (subarachnoid hemorrhage) ([place])   . Subarachnoid bleed ([place])   . Malignant hypertension     Current Outpatient Medications  Medication Sig Dispense Refill  . allopurinol (ZYLOPRIM) 100 MG tablet Take 1 tablet (100 mg total) by mouth daily. 90 tablet 1  . ALPRAZolam (XANAX) 0.5 MG tablet TAKE 1 TABLET BY MOUTH THREE TIMES A DAY AS NEEDED FOR ANXIETY DUE 11-14-2020 (Patient taking differently: Take 0.5 mg by mouth 3 (three) times daily as needed for anxiety.) 90 tablet 0  . dapagliflozin propanediol (FARXIGA) 10 MG TABS tablet Take 1 tablet (10 mg total) by mouth daily. 30 tablet 0  . fenofibrate (TRICOR) 145 MG tablet Take 1 tablet (145 mg total) by mouth daily. 90 tablet 1  . icosapent Ethyl (VASCEPA) 1 g capsule Take 2 capsules (2 g total) by mouth 2 (two) times daily. 120 capsule 11  . rosuvastatin (CRESTOR) 40 MG tablet TAKE 1 TABLET BY MOUTH ONCE DAILY. PATIENT NEEDS OV FOR  FURTHER REFILLS (Patient taking differently: Take 40 mg by mouth daily.) 90 tablet 3  . thiamine 100 MG tablet Take 1 tablet (100 mg total) by mouth daily. 30 tablet 0  . apixaban (ELIQUIS) 5 MG TABS tablet Take 1 tablet (5 mg total) by mouth 2 (two) times daily. 60 tablet 3  . folic acid (FOLVITE) 1 MG tablet Take 1 tablet (1 mg total) by mouth daily. 90 tablet 0  . metoprolol succinate (TOPROL-XL) 100 MG 24 hr tablet Take 1 tablet (100 mg total) by mouth daily. Take with or immediately following a meal. 90 tablet 0  . sacubitril-valsartan (ENTRESTO) 24-26 MG Take 1 tablet by mouth 2 (two) times daily. 60 tablet 3   No current facility-administered medications for this visit.    Allergies: Zetia [ezetimibe]  Past Medical History:  Diagnosis Date  . Brain aneurysm   . Chicken pox   . Depression   . Gout   . High cholesterol   . Hypertension     Past Surgical History:  Procedure Laterality Date  . CRANIOTOMY Right 03/02/2015   Procedure: CRANIOTOMY INTRACRANIAL  ANEURYSM FOR CLIPPING;  Surgeon: Consuella Lose, MD;  Location: [facility];  Service: Neurosurgery;  Laterality: Right;  . IR GENERIC HISTORICAL  02/16/2016   IR ANGIO VERTEBRAL SEL VERTEBRAL BILAT MOD SED 02/16/2016 Consuella Lose, MD [facility]  . IR GENERIC HISTORICAL  02/16/2016   IR ANGIO INTRA EXTRACRAN SEL INTERNAL CAROTID BILAT MOD SED 02/16/2016 Consuella Lose, MD [facility]  . RADIOLOGY WITH ANESTHESIA  N/A 03/02/2015   Procedure: RADIOLOGY WITH ANESTHESIA;  Surgeon: Consuella Lose, MD;  Location: [facility];  Service: Radiology;  Laterality: N/A;  . RIGHT/LEFT HEART CATH AND CORONARY ANGIOGRAPHY N/A 12/19/2020   Procedure: RIGHT/LEFT HEART CATH AND CORONARY ANGIOGRAPHY;  Surgeon: Martinique, Peter M, MD;  Location: [facility];  Service: Cardiovascular;  Laterality: N/A;  . WISDOM TOOTH EXTRACTION      Family History  Adopted: Yes    Social History   Tobacco Use  . Smoking status: Current Every Day  Smoker    Packs/day: 2.50    Years: 25.00    Pack years: 62.50    Types: Cigarettes  . Smokeless tobacco: Never Used  Substance Use Topics  . Alcohol use: Yes    Alcohol/week: 70.0 standard drinks    Types: 35 Cans of beer, 35 Shots of liquor per week    Subjective:   Accompanied by his wife; recently hospitalized secondary to fall from alcohol intoxication and found to be in RVR/ AKI/ CHF; Notes he has not had any alcohol since discharge;  Scheduled with cardiology in the next few weeks to discuss conversion;  Using walker for stability- having to do PT at home/ unable to get PT services in [facility]; notes that continuing to struggle with vertigo and admits that anxiety has been worsened recently;  Objective:  Vitals:   01/02/21 1137  Weight: 273 lb 12.8 oz (124.2 kg)  Height: '6\' 1"'  (1.854 m)    General: Well developed, well nourished, in no acute distress  Skin : Warm and dry.  Head: Normocephalic and atraumatic  Eyes: Sclera and conjunctiva clear; pupils round and reactive to light; extraocular movements intact  Ears: External normal; canals clear; tympanic membranes normal  Oropharynx: Pink, supple. No suspicious lesions  Neck: Supple without thyromegaly, adenopathy  Lungs: Respirations unlabored; clear to auscultation bilaterally without wheeze, rales, rhonchi  CVS exam: irregularly irregular rhythm   Extremities: No edema, cyanosis, clubbing  Vessels: Symmetric bilaterally  Neurologic: Alert and oriented; speech intact; face symmetrical; uses walker   Assessment:  1. Congestive heart failure, unspecified HF chronicity, unspecified heart failure type ([place])   2. Persistent atrial fibrillation ([place])     Plan:  Will update CBC, CMP and CXR today;  Extensive medication review done today; congratulated patient on sobriety; Keep planned follow up with cardiology; will not schedule specific follow-up here at this time- will most likely need to be seen here in 3-4  months;  This visit occurred during the SARS-CoV-2 public health emergency.  Safety protocols were in place, including screening questions prior to the visit, additional usage of staff PPE, and extensive cleaning of exam room while observing appropriate contact time as indicated for disinfecting solutions.      No follow-ups on file.  Orders Placed This Encounter  Procedures  . DG Chest 2 View    Standing Status:   Future    Standing Expiration Date:   01/02/2022    Order Specific Question:   Reason for Exam (SYMPTOM  OR DIAGNOSIS REQUIRED)    Answer:   history of chf    Order Specific Question:   Preferred imaging location?    Answer:   [facility]  . CBC with Differential/Platelet  . Comp Met (CMET)    Requested Prescriptions   Signed Prescriptions Disp Refills  . metoprolol succinate (TOPROL-XL) 100 MG 24 hr tablet 90 tablet 0    Sig: Take 1 tablet (100 mg total) by mouth  daily. Take with or immediately following a meal.  . apixaban (ELIQUIS) 5 MG TABS tablet 60 tablet 3    Sig: Take 1 tablet (5 mg total) by mouth 2 (two) times daily.  . folic acid (FOLVITE) 1 MG tablet 90 tablet 0    Sig: Take 1 tablet (1 mg total) by mouth daily.  . sacubitril-valsartan (ENTRESTO) 24-26 MG 60 tablet 3    Sig: Take 1 tablet by mouth 2 (two) times daily.

## 2021-01-11 ENCOUNTER — Other Ambulatory Visit: Payer: Self-pay | Admitting: Internal Medicine

## 2021-01-11 DIAGNOSIS — F411 Generalized anxiety disorder: Secondary | ICD-10-CM

## 2021-01-12 NOTE — Telephone Encounter (Signed)
Xanax done erx  Please to let pt know -   Needs rov with new pcp for further refills unless he plans to "follow" laura [place] [employer] to her new work site

## 2021-01-12 NOTE — H&P (View-Only) (Signed)
 Cardiology Clinic Note   Patient Name: Daniel Butler Date of Encounter: 01/15/2021  Primary Care Provider:  Murray, Laura Woodruff, FNP Primary Cardiologist:  Kenneth C Hilty, MD  Patient Profile    Daniel Butler 54-year-old male presents to the clinic for follow-up evaluation of his acute combined systolic and diastolic CHF and atrial fibrillation with RVR.  Past Medical History    Past Medical History:  Diagnosis Date  . Brain aneurysm   . Chicken pox   . Depression   . Gout   . High cholesterol   . Hypertension    Past Surgical History:  Procedure Laterality Date  . CRANIOTOMY Right 03/02/2015   Procedure: CRANIOTOMY INTRACRANIAL  ANEURYSM FOR CLIPPING;  Surgeon: Neelesh Nundkumar, MD;  Location: [facility];  Service: Neurosurgery;  Laterality: Right;  . IR GENERIC HISTORICAL  02/16/2016   IR ANGIO VERTEBRAL SEL VERTEBRAL BILAT MOD SED 02/16/2016 Neelesh Nundkumar, MD [facility]  . IR GENERIC HISTORICAL  02/16/2016   IR ANGIO INTRA EXTRACRAN SEL INTERNAL CAROTID BILAT MOD SED 02/16/2016 Neelesh Nundkumar, MD [facility]  . RADIOLOGY WITH ANESTHESIA N/A 03/02/2015   Procedure: RADIOLOGY WITH ANESTHESIA;  Surgeon: Neelesh Nundkumar, MD;  Location: [facility] OR;  Service: Radiology;  Laterality: N/A;  . RIGHT/LEFT HEART CATH AND CORONARY ANGIOGRAPHY N/A 12/19/2020   Procedure: RIGHT/LEFT HEART CATH AND CORONARY ANGIOGRAPHY;  Surgeon: Jordan, Peter M, MD;  Location: [facility];  Service: Cardiovascular;  Laterality: N/A;  . WISDOM TOOTH EXTRACTION      Allergies  Allergies  Allergen Reactions  . Zetia [Ezetimibe] Nausea And Vomiting    History of Present Illness    Daniel Butler has a PMH of hyperlipidemia, hypertension, brain aneurysm, tobacco use, EtOH use, anxiety, depression, acute combined systolic and diastolic CHF, and atrial fibrillation with RVR.  He was admitted to the hospital on 12/15/2020 and discharged on 12/23/2020.  It was felt that his new  onset combined systolic and diastolic heart failure was secondary to tachycardia induced atrial fibrillation or EtOH cardiomyopathy.  He underwent cardiac catheterization on 12/19/2020 which showed normal coronary arteries.  He was started on Entresto 24-26, spironolactone was not initiated due to hyperkalemia.  He presents the clinic today for follow-up evaluation states he feels fairly well.  He notes that he previously had dizziness which may be slightly worse.  This has been chronic for the past 2 years.  He has been monitoring his diet and reports compliance with his apixaban medication.  He has been compliant with his no lifting over 30 pounds due to his brain aneurysm.  He tried to work part-time but was unable to complete work due to fatigue and lack of fitness.  He has been fairly sedentary at home.  His EKG today shows atrial fibrillation.  It was reviewed with DOD and we will schedule him for DCCV.  Blood pressure today is 92/64.  He denies shortness of breath, chest pain, lower extremity edema, orthopnea and PND.  We will schedule him for DCCV, order BMP, have him continue to increase his physical activity as tolerated, continue to abstain from alcohol, and follow-up after DCCV.   Home Medications    Prior to Admission medications   Medication Sig Start Date End Date Taking? Authorizing Provider  allopurinol (ZYLOPRIM) 100 MG tablet Take 1 tablet (100 mg total) by mouth daily. 12/11/20   Murray, Laura Woodruff, FNP  ALPRAZolam (XANAX) 0.5 MG tablet TAKE 1 TABLET BY MOUTH THREE TIMES A DAY   AS NEEDED FOR ANXIETY DUE 11-14-2020 Patient taking differently: Take 0.5 mg by mouth 3 (three) times daily as needed for anxiety. 12/11/20   Corwin Levins, MD  apixaban (ELIQUIS) 5 MG TABS tablet Take 1 tablet (5 mg total) by mouth 2 (two) times daily. 01/02/21   Olive Bass, FNP  dapagliflozin propanediol (FARXIGA) 10 MG TABS tablet Take 1 tablet (10 mg total) by mouth daily. 12/24/20   Leroy Sea, MD  fenofibrate (TRICOR) 145 MG tablet Take 1 tablet (145 mg total) by mouth daily. 12/11/20   Olive Bass, FNP  folic acid (FOLVITE) 1 MG tablet Take 1 tablet (1 mg total) by mouth daily. 01/02/21   Olive Bass, FNP  icosapent Ethyl (VASCEPA) 1 g capsule Take 2 capsules (2 g total) by mouth 2 (two) times daily. 11/11/19   Hilty, Lisette Abu, MD  metoprolol succinate (TOPROL-XL) 100 MG 24 hr tablet Take 1 tablet (100 mg total) by mouth daily. Take with or immediately following a meal. 01/02/21   Olive Bass, FNP  rosuvastatin (CRESTOR) 40 MG tablet TAKE 1 TABLET BY MOUTH ONCE DAILY. PATIENT NEEDS OV FOR FURTHER REFILLS Patient taking differently: Take 40 mg by mouth daily. 12/11/20   Hilty, Lisette Abu, MD  sacubitril-valsartan (ENTRESTO) 24-26 MG Take 1 tablet by mouth 2 (two) times daily. 01/02/21   Olive Bass, FNP  thiamine 100 MG tablet Take 1 tablet (100 mg total) by mouth daily. 12/24/20   Leroy Sea, MD    Family History    Family History  Adopted: Yes   is adopted.   Social History    Social History   Socioeconomic History  . Marital status: Married    Spouse name: Daniel Butler  . Number of children: 0  . Years of education: 20  . Highest education level: Some college, no degree  Occupational History  . 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   Tobacco Use  . Smoking status: Current Every Day Smoker    Packs/day: 2.50    Years: 25.00    Pack years: 62.50    Types: Cigarettes  . Smokeless tobacco: Never Used  Vaping Use  . Vaping Use: Never used  Substance and Sexual Activity  . Alcohol use: Yes    Alcohol/week: 70.0 standard drinks    Types: 35 Cans of beer, 35 Shots of liquor per week  . Drug use: No  . Sexual activity: Not Currently    Partners: Female    Birth control/protection: None  Other Topics Concern  . Not on file  Social History Narrative   Denies religious beliefs  effecting health care.    Social Determinants of Health   Financial Resource Strain: Low Risk   . Difficulty of Paying Living Expenses: Not hard at all  Food Insecurity: No Food Insecurity  . Worried About Programme researcher, broadcasting/film/video in the Last Year: Never true  . Ran Out of Food in the Last Year: Never true  Transportation Needs: No Transportation Needs  . Lack of Transportation (Medical): No  . Lack of Transportation (Non-Medical): No  Physical Activity: Not on file  Stress: Not on file  Social Connections: Not on file  Intimate Partner Violence: Not on file     Review of Systems    General:  No chills, fever, night sweats or weight changes.  Cardiovascular:  No chest pain, dyspnea on exertion, edema, orthopnea, palpitations, paroxysmal nocturnal dyspnea. Dermatological: No  rash, lesions/masses Respiratory: No cough, dyspnea Urologic: No hematuria, dysuria Abdominal:   No nausea, vomiting, diarrhea, bright red blood per rectum, melena, or hematemesis Neurologic:  No visual changes, wkns, changes in mental status. All other systems reviewed and are otherwise negative except as noted above.  Physical Exam    VS:  BP 92/64   Pulse (!) 120   Ht 6\' 1"  (1.854 m)   Wt 261 lb 3.2 oz (118.5 kg)   SpO2 96%   BMI 34.46 kg/m  , BMI Body mass index is 34.46 kg/m. GEN: Well nourished, well developed, in no acute distress. HEENT: normal. Neck: Supple, no JVD, carotid bruits, or masses. Cardiac: RRR, no murmurs, rubs, or gallops. No clubbing, cyanosis, edema.  Radials/DP/PT 2+ and equal bilaterally.  Respiratory:  Respirations regular and unlabored, clear to auscultation bilaterally. GI: Soft, nontender, nondistended, BS + x 4. MS: no deformity or atrophy. Skin: warm and dry, no rash. Neuro:  Strength and sensation are intact. Psych: Normal affect.  Accessory Clinical Findings    Recent Labs: 12/15/2020: TSH 2.377 12/21/2020: B Natriuretic Peptide 411.2; Magnesium 2.0 01/02/2021: ALT  19; BUN 23; Creatinine, Ser 1.50; Hemoglobin 17.3; Platelets 370.0; Potassium 4.9; Sodium 135   Recent Lipid Panel    Component Value Date/Time   CHOL 107 11/13/2020 1041   TRIG 233 (H) 11/13/2020 1041   HDL 33 (L) 11/13/2020 1041   CHOLHDL 3.5 10/20/2019 1212   CHOLHDL 4 02/22/2019 1444   VLDL 53.8 (H) 02/22/2019 1444   LDLCALC 38 11/13/2020 1041   LDLDIRECT 48 10/20/2019 1212   LDLDIRECT 58.0 02/22/2019 1444    ECG personally reviewed by me today-atrial fibrillation with RVR incomplete right bundle branch block 120 bpm- No acute changes  Echocardiogram 12/17/20 IMPRESSIONS    1. Left ventricular ejection fraction, by estimation, is 30 to 35%. The  left ventricle has moderately decreased function. The left ventricle  demonstrates global hypokinesis. Left ventricular diastolic function could  not be evaluated.  2. Right ventricular systolic function is normal. The right ventricular  size is normal. There is normal pulmonary artery systolic pressure.  3. Right atrial size was mildly dilated.  4. The mitral valve is normal in structure. No evidence of mitral valve  regurgitation.  5. The aortic valve is normal in structure. Aortic valve regurgitation is  not visualized.  Cardiac catheterization 12/19/2020  There is moderate left ventricular systolic dysfunction.  LV end diastolic pressure is normal.  The left ventricular ejection fraction is 35-45% by visual estimate.  Hemodynamic findings consistent with mild pulmonary hypertension.   1. Normal coronary anatomy 2. Moderate LV dysfunction. EF estimated at 40% 3. Normal LV filling pressures 4. Mild pulmonary HTN. Mean PAP 24 mm Hg 5. Reduced cardiac output. Co ox 63%. Cardiac index 1.89  Plan: medical management. OK to start oral anticoagulation this pm.  Diagnostic Dominance: Co-dominant    Intervention    Assessment & Plan   1.  New acute combined systolic and diastolic CHF- euvolemic today.   Echocardiogram showed an EF of 30-35%.  It was felt that his new onset CHF was secondary to A. fib RVR or EtOH cardiomyopathy.  His cardiac catheterization showed normal coronary arteries.  He was started on Entresto however, spironolactone was not started due to hyperkalemia. BMP 01/02/2021 showed a potassium of 4.9 with a creatinine of 1.50.  We will review BMP prior to initiation of spironolactone. Continue Entresto, metoprolol, Farxiga Heart healthy low-sodium diet-salty 6 given Increase physical activity as  tolerated Daily weights Order BMP  Atrial fibrillation with RVR- EKG today shows atrial fibrillation with RVR incomplete right bundle branch block  120 bpm.  CHA2DS2-VASc score 2 for CHF and hypertension.  Previously started on apixaban and metoprolol with plan for DCCV if not converted on follow-up.  Case discussed with DOD. Continue apixaban, metoprolol Heart healthy low-sodium diet-salty 6 given Increase physical activity as tolerated Schedule for DCCV  Shared Decision Making/Informed Consent The risks (stroke, cardiac arrhythmias rarely resulting in the need for a temporary or permanent pacemaker, skin irritation or burns and complications associated with conscious sedation including aspiration, arrhythmia, respiratory failure and death), benefits (restoration of normal sinus rhythm) and alternatives of a direct current cardioversion were explained in detail to Mr. Borin and he agrees to proceed.  AKI- creatinine 1.50 on 01/02/2021.  Baseline appears to be 1.4-1.50. Follows with PCP  Disposition: Follow-up with [name] or me  after DCCV.   Thomasene Ripple. Anea Fodera NP-C    01/15/2021, 12:07 PM [facility] [address] Office [phone] Fax [phone]  Notice: This dictation was prepared with Dragon dictation along with smaller phrase technology. Any transcriptional errors that result from this process are unintentional and may not be  corrected upon review.  I spent98minutes examining this patient, reviewing medications, and using patient centered shared decision making involving her cardiac care.  Prior to her visit I spent greater than 20 minutes reviewing her past medical history,  medications, and prior cardiac tests.

## 2021-01-12 NOTE — Progress Notes (Signed)
Cardiology Clinic Note   Patient Name: Daniel Butler Date of Encounter: 01/15/2021  Primary Care Provider:  Olive Bass, FNP Primary Cardiologist:  Chrystie Nose, MD  Patient Profile    Daniel Butler 55 year old male presents to the clinic for follow-up evaluation of his acute combined systolic and diastolic CHF and atrial fibrillation with RVR.  Past Medical History    Past Medical History:  Diagnosis Date  . Brain aneurysm   . Chicken pox   . Depression   . Gout   . High cholesterol   . Hypertension    Past Surgical History:  Procedure Laterality Date  . CRANIOTOMY Right 03/02/2015   Procedure: CRANIOTOMY INTRACRANIAL  ANEURYSM FOR CLIPPING;  Surgeon: Lisbeth Renshaw, MD;  Location: [facility];  Service: Neurosurgery;  Laterality: Right;  . IR GENERIC HISTORICAL  02/16/2016   IR ANGIO VERTEBRAL SEL VERTEBRAL BILAT MOD SED 02/16/2016 Lisbeth Renshaw, MD [facility]  . IR GENERIC HISTORICAL  02/16/2016   IR ANGIO INTRA EXTRACRAN SEL INTERNAL CAROTID BILAT MOD SED 02/16/2016 Lisbeth Renshaw, MD [facility]  . RADIOLOGY WITH ANESTHESIA N/A 03/02/2015   Procedure: RADIOLOGY WITH ANESTHESIA;  Surgeon: Lisbeth Renshaw, MD;  Location: [facility] OR;  Service: Radiology;  Laterality: N/A;  . RIGHT/LEFT HEART CATH AND CORONARY ANGIOGRAPHY N/A 12/19/2020   Procedure: RIGHT/LEFT HEART CATH AND CORONARY ANGIOGRAPHY;  Surgeon: [place], Peter M, MD;  Location: [facility] INVASIVE CV LAB;  Service: Cardiovascular;  Laterality: N/A;  . WISDOM TOOTH EXTRACTION      Allergies  Allergies  Allergen Reactions  . Zetia [Ezetimibe] Nausea And Vomiting    History of Present Illness    Daniel Butler has a PMH of hyperlipidemia, hypertension, brain aneurysm, tobacco use, EtOH use, anxiety, depression, acute combined systolic and diastolic CHF, and atrial fibrillation with RVR.  He was admitted to the hospital on 12/15/2020 and discharged on 12/23/2020.  It was felt that his new  onset combined systolic and diastolic heart failure was secondary to tachycardia induced atrial fibrillation or EtOH cardiomyopathy.  He underwent cardiac catheterization on 12/19/2020 which showed normal coronary arteries.  He was started on Entresto 24-26, spironolactone was not initiated due to hyperkalemia.  He presents the clinic today for follow-up evaluation states he feels fairly well.  He notes that he previously had dizziness which may be slightly worse.  This has been chronic for the past 2 years.  He has been monitoring his diet and reports compliance with his apixaban medication.  He has been compliant with his no lifting over 30 pounds due to his brain aneurysm.  He tried to work part-time but was unable to complete work due to fatigue and lack of fitness.  He has been fairly sedentary at home.  His EKG today shows atrial fibrillation.  It was reviewed with DOD and we will schedule him for DCCV.  Blood pressure today is 92/64.  He denies shortness of breath, chest pain, lower extremity edema, orthopnea and PND.  We will schedule him for DCCV, order BMP, have him continue to increase his physical activity as tolerated, continue to abstain from alcohol, and follow-up after DCCV.   Home Medications    Prior to Admission medications   Medication Sig Start Date End Date Taking? Authorizing Provider  allopurinol (ZYLOPRIM) 100 MG tablet Take 1 tablet (100 mg total) by mouth daily. 12/11/20   Olive Bass, FNP  ALPRAZolam Prudy Feeler) 0.5 MG tablet TAKE 1 TABLET BY MOUTH THREE TIMES A DAY  AS NEEDED FOR ANXIETY DUE 11-14-2020 Patient taking differently: Take 0.5 mg by mouth 3 (three) times daily as needed for anxiety. 12/11/20   Corwin Levins, MD  apixaban (ELIQUIS) 5 MG TABS tablet Take 1 tablet (5 mg total) by mouth 2 (two) times daily. 01/02/21   Olive Bass, FNP  dapagliflozin propanediol (FARXIGA) 10 MG TABS tablet Take 1 tablet (10 mg total) by mouth daily. 12/24/20   Leroy Sea, MD  fenofibrate (TRICOR) 145 MG tablet Take 1 tablet (145 mg total) by mouth daily. 12/11/20   Olive Bass, FNP  folic acid (FOLVITE) 1 MG tablet Take 1 tablet (1 mg total) by mouth daily. 01/02/21   Olive Bass, FNP  icosapent Ethyl (VASCEPA) 1 g capsule Take 2 capsules (2 g total) by mouth 2 (two) times daily. 11/11/19   Hilty, Lisette Abu, MD  metoprolol succinate (TOPROL-XL) 100 MG 24 hr tablet Take 1 tablet (100 mg total) by mouth daily. Take with or immediately following a meal. 01/02/21   Olive Bass, FNP  rosuvastatin (CRESTOR) 40 MG tablet TAKE 1 TABLET BY MOUTH ONCE DAILY. PATIENT NEEDS OV FOR FURTHER REFILLS Patient taking differently: Take 40 mg by mouth daily. 12/11/20   Hilty, Lisette Abu, MD  sacubitril-valsartan (ENTRESTO) 24-26 MG Take 1 tablet by mouth 2 (two) times daily. 01/02/21   Olive Bass, FNP  thiamine 100 MG tablet Take 1 tablet (100 mg total) by mouth daily. 12/24/20   Leroy Sea, MD    Family History    Family History  Adopted: Yes   is adopted.   Social History    Social History   Socioeconomic History  . Marital status: Married    Spouse name: Othal Kubitz  . Number of children: 0  . Years of education: 20  . Highest education level: Some college, no degree  Occupational History  . 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   Tobacco Use  . Smoking status: Current Every Day Smoker    Packs/day: 2.50    Years: 25.00    Pack years: 62.50    Types: Cigarettes  . Smokeless tobacco: Never Used  Vaping Use  . Vaping Use: Never used  Substance and Sexual Activity  . Alcohol use: Yes    Alcohol/week: 70.0 standard drinks    Types: 35 Cans of beer, 35 Shots of liquor per week  . Drug use: No  . Sexual activity: Not Currently    Partners: Female    Birth control/protection: None  Other Topics Concern  . Not on file  Social History Narrative   Denies religious beliefs  effecting health care.    Social Determinants of Health   Financial Resource Strain: Low Risk   . Difficulty of Paying Living Expenses: Not hard at all  Food Insecurity: No Food Insecurity  . Worried About Programme researcher, broadcasting/film/video in the Last Year: Never true  . Ran Out of Food in the Last Year: Never true  Transportation Needs: No Transportation Needs  . Lack of Transportation (Medical): No  . Lack of Transportation (Non-Medical): No  Physical Activity: Not on file  Stress: Not on file  Social Connections: Not on file  Intimate Partner Violence: Not on file     Review of Systems    General:  No chills, fever, night sweats or weight changes.  Cardiovascular:  No chest pain, dyspnea on exertion, edema, orthopnea, palpitations, paroxysmal nocturnal dyspnea. Dermatological: No  rash, lesions/masses Respiratory: No cough, dyspnea Urologic: No hematuria, dysuria Abdominal:   No nausea, vomiting, diarrhea, bright red blood per rectum, melena, or hematemesis Neurologic:  No visual changes, wkns, changes in mental status. All other systems reviewed and are otherwise negative except as noted above.  Physical Exam    VS:  BP 92/64   Pulse (!) 120   Ht 6\' 1"  (1.854 m)   Wt 261 lb 3.2 oz (118.5 kg)   SpO2 96%   BMI 34.46 kg/m  , BMI Body mass index is 34.46 kg/m. GEN: Well nourished, well developed, in no acute distress. HEENT: normal. Neck: Supple, no JVD, carotid bruits, or masses. Cardiac: RRR, no murmurs, rubs, or gallops. No clubbing, cyanosis, edema.  Radials/DP/PT 2+ and equal bilaterally.  Respiratory:  Respirations regular and unlabored, clear to auscultation bilaterally. GI: Soft, nontender, nondistended, BS + x 4. MS: no deformity or atrophy. Skin: warm and dry, no rash. Neuro:  Strength and sensation are intact. Psych: Normal affect.  Accessory Clinical Findings    Recent Labs: 12/15/2020: TSH 2.377 12/21/2020: B Natriuretic Peptide 411.2; Magnesium 2.0 01/02/2021: ALT  19; BUN 23; Creatinine, Ser 1.50; Hemoglobin 17.3; Platelets 370.0; Potassium 4.9; Sodium 135   Recent Lipid Panel    Component Value Date/Time   CHOL 107 11/13/2020 1041   TRIG 233 (H) 11/13/2020 1041   HDL 33 (L) 11/13/2020 1041   CHOLHDL 3.5 10/20/2019 1212   CHOLHDL 4 02/22/2019 1444   VLDL 53.8 (H) 02/22/2019 1444   LDLCALC 38 11/13/2020 1041   LDLDIRECT 48 10/20/2019 1212   LDLDIRECT 58.0 02/22/2019 1444    ECG personally reviewed by me today-atrial fibrillation with RVR incomplete right bundle branch block 120 bpm- No acute changes  Echocardiogram 12/17/20 IMPRESSIONS    1. Left ventricular ejection fraction, by estimation, is 30 to 35%. The  left ventricle has moderately decreased function. The left ventricle  demonstrates global hypokinesis. Left ventricular diastolic function could  not be evaluated.  2. Right ventricular systolic function is normal. The right ventricular  size is normal. There is normal pulmonary artery systolic pressure.  3. Right atrial size was mildly dilated.  4. The mitral valve is normal in structure. No evidence of mitral valve  regurgitation.  5. The aortic valve is normal in structure. Aortic valve regurgitation is  not visualized.  Cardiac catheterization 12/19/2020  There is moderate left ventricular systolic dysfunction.  LV end diastolic pressure is normal.  The left ventricular ejection fraction is 35-45% by visual estimate.  Hemodynamic findings consistent with mild pulmonary hypertension.   1. Normal coronary anatomy 2. Moderate LV dysfunction. EF estimated at 40% 3. Normal LV filling pressures 4. Mild pulmonary HTN. Mean PAP 24 mm Hg 5. Reduced cardiac output. Co ox 63%. Cardiac index 1.89  Plan: medical management. OK to start oral anticoagulation this pm.  Diagnostic Dominance: Co-dominant    Intervention    Assessment & Plan   1.  New acute combined systolic and diastolic CHF- euvolemic today.   Echocardiogram showed an EF of 30-35%.  It was felt that his new onset CHF was secondary to A. fib RVR or EtOH cardiomyopathy.  His cardiac catheterization showed normal coronary arteries.  He was started on Entresto however, spironolactone was not started due to hyperkalemia. BMP 01/02/2021 showed a potassium of 4.9 with a creatinine of 1.50.  We will review BMP prior to initiation of spironolactone. Continue Entresto, metoprolol, Farxiga Heart healthy low-sodium diet-salty 6 given Increase physical activity as  tolerated Daily weights Order BMP  Atrial fibrillation with RVR- EKG today shows atrial fibrillation with RVR incomplete right bundle branch block  120 bpm.  CHA2DS2-VASc score 2 for CHF and hypertension.  Previously started on apixaban and metoprolol with plan for DCCV if not converted on follow-up.  Case discussed with DOD. Continue apixaban, metoprolol Heart healthy low-sodium diet-salty 6 given Increase physical activity as tolerated Schedule for DCCV  Shared Decision Making/Informed Consent The risks (stroke, cardiac arrhythmias rarely resulting in the need for a temporary or permanent pacemaker, skin irritation or burns and complications associated with conscious sedation including aspiration, arrhythmia, respiratory failure and death), benefits (restoration of normal sinus rhythm) and alternatives of a direct current cardioversion were explained in detail to Mr. Borin and he agrees to proceed.  AKI- creatinine 1.50 on 01/02/2021.  Baseline appears to be 1.4-1.50. Follows with PCP  Disposition: Follow-up with [name] or me  after DCCV.   Thomasene Ripple. Chelisa Hennen NP-C    01/15/2021, 12:07 PM [facility] [address] Office [phone] Fax [phone]  Notice: This dictation was prepared with Dragon dictation along with smaller phrase technology. Any transcriptional errors that result from this process are unintentional and may not be  corrected upon review.  I spent98minutes examining this patient, reviewing medications, and using patient centered shared decision making involving her cardiac care.  Prior to her visit I spent greater than 20 minutes reviewing her past medical history,  medications, and prior cardiac tests.

## 2021-01-15 ENCOUNTER — Telehealth: Payer: Self-pay

## 2021-01-15 ENCOUNTER — Encounter: Payer: Self-pay | Admitting: General Practice

## 2021-01-15 ENCOUNTER — Ambulatory Visit (INDEPENDENT_AMBULATORY_CARE_PROVIDER_SITE_OTHER): Payer: Self-pay | Admitting: General Practice

## 2021-01-15 ENCOUNTER — Other Ambulatory Visit: Payer: Self-pay

## 2021-01-15 VITALS — BP 92/64 | HR 120 | Ht 73.0 in | Wt 261.2 lb

## 2021-01-15 DIAGNOSIS — I4891 Unspecified atrial fibrillation: Secondary | ICD-10-CM

## 2021-01-15 DIAGNOSIS — N179 Acute kidney failure, unspecified: Secondary | ICD-10-CM

## 2021-01-15 DIAGNOSIS — Z79899 Other long term (current) drug therapy: Secondary | ICD-10-CM

## 2021-01-15 DIAGNOSIS — I5041 Acute combined systolic (congestive) and diastolic (congestive) heart failure: Secondary | ICD-10-CM

## 2021-01-15 NOTE — Telephone Encounter (Signed)
Patient notified and plans to follow Vernona Rieger.

## 2021-01-15 NOTE — Progress Notes (Signed)
Marcelino Duster, RN, inquired about assistance with medications. LCSW recommended that pt be provided w/ manufacturer assistance program applications as pt uninsured and medications not covered by [facility] or alternate programs.   Pt also had previously been enrolled on 11/21/20 as confirmed by transportation coordinator Ja'Mesha. Transportation services card provided to [place] to give to pt/pt wife. Pt may call and schedule rides to upcoming in network [facility] [place] appointments as needed.   Octavio Graves, MSW, LCSW [facility] Heart/Vascular Care Navigation  [phone]

## 2021-01-15 NOTE — Telephone Encounter (Signed)
Called and s/w pt wife, (DPR) informed her that [facility] transportation was arranged ans all they have to do is call ahead of time to arrange. Daniel Butler states that "luckly I have a very understanding boss" and he will let me re-arrange my schedule to fit around pt's appointments. So she will decline [place] transportation at this time. She will call back if needed

## 2021-01-15 NOTE — Patient Instructions (Addendum)
Daniel Butler; You are scheduled for a  Cardioversion on Monday 01-22-2021 with Dr. Shari Prows.  Please arrive at the [facility] (Main Entrance A) at [facility]: [address] [place], [place] [address] at 8AM  (1 hour prior to procedure unless lab work is needed; if lab work is needed arrive 1.5 hours ahead)  DIET: Nothing to eat or drink after midnight except a sip of water with medications (see medication instructions below)  Your COVID test is scheduled for  Tuesday 01-18-2021 at 11AM. After receiving the COVID test you must quarantine(at your home) yourself from after your receive the test until you leave for your procedure.  COVID testing site:  [address] [place]. [place], [place] [address] Drive thru hours M-F 9-4:[id number]  Sat 9-12:30PM.  Medication Instructions: Continue your anticoagulant: AND ALL YOUR MEDICATIONS  FOLLOW UP AFTER CARDIOVERSION: Wednesday 02-07-2021 @ 1045AM WITH Daniel Butler  Labs: BMET TODAY You must have a responsible person to drive you home and stay in the waiting area during your procedure. Failure to do so could result in cancellation.  Bring your insurance cards.  *Special Note: Every effort is made to have your procedure done on time. Occasionally there are emergencies that occur at the hospital that may cause delays. Please be patient if a delay does occur.

## 2021-01-16 LAB — BASIC METABOLIC PANEL
BUN/Creatinine Ratio: 12 (ref 9–20)
BUN: 17 mg/dL (ref 6–24)
CO2: 18 mmol/L — ABNORMAL LOW (ref 20–29)
Calcium: 10.1 mg/dL (ref 8.7–10.2)
Chloride: 108 mmol/L — ABNORMAL HIGH (ref 96–106)
Creatinine, Ser: 1.37 mg/dL — ABNORMAL HIGH (ref 0.76–1.27)
Glucose: 100 mg/dL — ABNORMAL HIGH (ref 65–99)
Potassium: 5 mmol/L (ref 3.5–5.2)
Sodium: 142 mmol/L (ref 134–144)
eGFR: 61 mL/min/{1.73_m2} (ref >59)

## 2021-01-18 ENCOUNTER — Other Ambulatory Visit (HOSPITAL_COMMUNITY)
Admission: RE | Admit: 2021-01-18 | Discharge: 2021-01-18 | Disposition: A | Discharge status: Home / Self Care | Payer: Self-pay | Source: Ambulatory Visit | Attending: Cardiology | Admitting: Cardiology

## 2021-01-18 DIAGNOSIS — Z01812 Encounter for preprocedural laboratory examination: Secondary | ICD-10-CM | POA: Insufficient documentation

## 2021-01-18 DIAGNOSIS — Z20822 Contact with and (suspected) exposure to covid-19: Secondary | ICD-10-CM | POA: Insufficient documentation

## 2021-01-18 LAB — SARS CORONAVIRUS 2 (TAT 6-24 HRS): SARS Coronavirus 2: NEGATIVE

## 2021-01-22 ENCOUNTER — Encounter (HOSPITAL_COMMUNITY): Payer: Self-pay | Admitting: Cardiology

## 2021-01-22 ENCOUNTER — Ambulatory Visit (HOSPITAL_COMMUNITY)
Admission: RE | Admit: 2021-01-22 | Discharge: 2021-01-22 | Disposition: A | Discharge status: Home / Self Care | Payer: Self-pay | Attending: Cardiology | Admitting: Cardiology

## 2021-01-22 ENCOUNTER — Ambulatory Visit (HOSPITAL_COMMUNITY): Payer: Self-pay | Admitting: Certified Registered Nurse Anesthetist

## 2021-01-22 ENCOUNTER — Other Ambulatory Visit: Payer: Self-pay

## 2021-01-22 ENCOUNTER — Encounter (HOSPITAL_COMMUNITY)
Admission: RE | Disposition: A | Discharge status: Home / Self Care | Payer: Self-pay | Source: Home / Self Care | Attending: Cardiology

## 2021-01-22 DIAGNOSIS — N179 Acute kidney failure, unspecified: Secondary | ICD-10-CM | POA: Insufficient documentation

## 2021-01-22 DIAGNOSIS — F419 Anxiety disorder, unspecified: Secondary | ICD-10-CM | POA: Insufficient documentation

## 2021-01-22 DIAGNOSIS — I5041 Acute combined systolic (congestive) and diastolic (congestive) heart failure: Secondary | ICD-10-CM | POA: Insufficient documentation

## 2021-01-22 DIAGNOSIS — Z888 Allergy status to other drugs, medicaments and biological substances status: Secondary | ICD-10-CM | POA: Insufficient documentation

## 2021-01-22 DIAGNOSIS — E785 Hyperlipidemia, unspecified: Secondary | ICD-10-CM | POA: Insufficient documentation

## 2021-01-22 DIAGNOSIS — I482 Chronic atrial fibrillation, unspecified: Secondary | ICD-10-CM | POA: Insufficient documentation

## 2021-01-22 DIAGNOSIS — F32A Depression, unspecified: Secondary | ICD-10-CM | POA: Insufficient documentation

## 2021-01-22 DIAGNOSIS — I110 Hypertensive heart disease with heart failure: Secondary | ICD-10-CM | POA: Insufficient documentation

## 2021-01-22 DIAGNOSIS — Z7984 Long term (current) use of oral hypoglycemic drugs: Secondary | ICD-10-CM | POA: Insufficient documentation

## 2021-01-22 DIAGNOSIS — Z7901 Long term (current) use of anticoagulants: Secondary | ICD-10-CM | POA: Insufficient documentation

## 2021-01-22 DIAGNOSIS — Z79899 Other long term (current) drug therapy: Secondary | ICD-10-CM | POA: Insufficient documentation

## 2021-01-22 DIAGNOSIS — F17210 Nicotine dependence, cigarettes, uncomplicated: Secondary | ICD-10-CM | POA: Insufficient documentation

## 2021-01-22 DIAGNOSIS — I4819 Other persistent atrial fibrillation: Secondary | ICD-10-CM

## 2021-01-22 HISTORY — PX: CARDIOVERSION: SHX1299

## 2021-01-22 SURGERY — CARDIOVERSION
Anesthesia: Monitor Anesthesia Care

## 2021-01-22 MED ORDER — SODIUM CHLORIDE 0.9 % IV SOLN: INTRAVENOUS | Status: DC | PRN

## 2021-01-22 MED ORDER — AMIODARONE HCL 200 MG PO TABS: 200.0000 mg | ORAL_TABLET | Freq: Two times a day (BID) | ORAL | 0 refills | Status: DC

## 2021-01-22 MED ORDER — PROPOFOL 10 MG/ML IV BOLUS
INTRAVENOUS | Status: DC | PRN
  Administered 2021-01-22: 10 mg via INTRAVENOUS
  Administered 2021-01-22: 80 mg via INTRAVENOUS

## 2021-01-22 MED ORDER — LIDOCAINE 2% (20 MG/ML) 5 ML SYRINGE
INTRAMUSCULAR | Status: DC | PRN
  Administered 2021-01-22: 80 mg via INTRAVENOUS

## 2021-01-22 NOTE — Anesthesia Postprocedure Evaluation (Signed)
Anesthesia Post Note  Patient: Daniel Butler  Procedure(s) Performed: CARDIOVERSION (N/A )     Patient location during evaluation: Endoscopy Anesthesia Type: General Level of consciousness: awake and alert Pain management: pain level controlled Vital Signs Assessment: post-procedure vital signs reviewed and stable Respiratory status: spontaneous breathing, nonlabored ventilation, respiratory function stable and patient connected to nasal cannula oxygen Cardiovascular status: blood pressure returned to baseline and stable Postop Assessment: no apparent nausea or vomiting Anesthetic complications: no   No complications documented.  Last Vitals:  Vitals:   01/22/21 0940 01/22/21 0950  BP: (!) 93/55 (!) 102/58  Pulse: (!) 103 (!) 25  Resp: 16 19  Temp:    SpO2: 95% 94%    Last Pain:  Vitals:   01/22/21 0950  TempSrc:   PainSc: 0-No pain                 Nelle Don Aelyn Stanaland

## 2021-01-22 NOTE — Interval H&P Note (Signed)
History and Physical Interval Note:  01/22/2021 8:32 AM  Daniel Butler  has presented today for surgery, with the diagnosis of AFIB W/RVR.  The various methods of treatment have been discussed with the patient and family. After consideration of risks, benefits and other options for treatment, the patient has consented to  Procedure(s): CARDIOVERSION (N/A) as a surgical intervention.  The patient's history has been reviewed, patient examined, no change in status, stable for surgery.  I have reviewed the patient's chart and labs.  Questions were answered to the patient's satisfaction.     Meriam Sprague

## 2021-01-22 NOTE — Transfer of Care (Signed)
Immediate Anesthesia Transfer of Care Note  Patient: Daniel Butler  Procedure(s) Performed: CARDIOVERSION (N/A )  Patient Location: Endoscopy Unit  Anesthesia Type:General  Level of Consciousness: awake, alert  and oriented  Airway & Oxygen Therapy: Patient Spontanous Breathing and Patient connected to nasal cannula oxygen  Post-op Assessment: Report given to RN and Post -op Vital signs reviewed and stable  Post vital signs: Reviewed and stable  Last Vitals:  Vitals Value Taken Time  BP 85/64 (70)   Temp    Pulse 99   Resp 12   SpO2 97     Last Pain:  Vitals:   01/22/21 0822  TempSrc: Temporal  PainSc: 0-No pain         Complications: No complications documented.

## 2021-01-22 NOTE — Procedures (Signed)
Procedure: Electrical Cardioversion Indications:  Atrial Fibrillation  Procedure Details:  Consent: Risks of procedure as well as the alternatives and risks of each were explained to the (patient/caregiver).  Consent for procedure obtained.  Time Out: Verified patient identification, verified procedure, site/side was marked, verified correct patient position, special equipment/implants available, medications/allergies/relevent history reviewed, required imaging and test results available. PERFORMED.  Patient placed on cardiac monitor, pulse oximetry, supplemental oxygen as necessary.  Sedation given: Lidocaine 80mg ; propofol 90mg  Pacer pads placed anterior and posterior chest.  Cardioverted 3 time(s).  Cardioversion with synchronized biphasic 200J shock.  Evaluation: Findings: Post procedure EKG shows: Atrial Fibrillation Complications: None Patient did tolerate procedure well.  Time Spent Directly with the Patient:    01/22/2021, 9:20 AM

## 2021-01-22 NOTE — Discharge Instructions (Signed)
Electrical Cardioversion Electrical cardioversion is the delivery of a jolt of electricity to restore a normal rhythm to the heart. A rhythm that is too fast or is not regular keeps the heart from pumping well. In this procedure, sticky patches or metal paddles are placed on the chest to deliver electricity to the heart from a device. This procedure may be done in an emergency if:  There is low or no blood pressure as a result of the heart rhythm.  Normal rhythm must be restored as fast as possible to protect the brain and heart from further damage.  It may save a life. This may also be a scheduled procedure for irregular or fast heart rhythms that are not immediately life-threatening. Tell a health care provider about:  Any allergies you have.  All medicines you are taking, including vitamins, herbs, eye drops, creams, and over-the-counter medicines.  Any problems you or family members have had with anesthetic medicines.  Any blood disorders you have.  Any surgeries you have had.  Any medical conditions you have.  Whether you are pregnant or may be pregnant. What are the risks? Generally, this is a safe procedure. However, problems may occur, including:  Allergic reactions to medicines.  A blood clot that breaks free and travels to other parts of your body.  The possible return of an abnormal heart rhythm within hours or days after the procedure.  Your heart stopping (cardiac arrest). This is rare. What happens before the procedure? Medicines  Your health care provider may have you start taking: ? Blood-thinning medicines (anticoagulants) so your blood does not clot as easily. ? Medicines to help stabilize your heart rate and rhythm.  Ask your health care provider about: ? Changing or stopping your regular medicines. This is especially important if you are taking diabetes medicines or blood thinners. ? Taking medicines such as aspirin and ibuprofen. These medicines can  thin your blood. Do not take these medicines unless your health care provider tells you to take them. ? Taking over-the-counter medicines, vitamins, herbs, and supplements. General instructions  Follow instructions from your health care provider about eating or drinking restrictions.  Plan to have someone take you home from the hospital or clinic.  If you will be going home right after the procedure, plan to have someone with you for 24 hours.  Ask your health care provider what steps will be taken to help prevent infection. These may include washing your skin with a germ-killing soap. What happens during the procedure?  An IV will be inserted into one of your veins.  Sticky patches (electrodes) or metal paddles may be placed on your chest.  You will be given a medicine to help you relax (sedative).  An electrical shock will be delivered. The procedure may vary among health care providers and hospitals.   What can I expect after the procedure?  Your blood pressure, heart rate, breathing rate, and blood oxygen level will be monitored until you leave the hospital or clinic.  Your heart rhythm will be watched to make sure it does not change.  You may have some redness on the skin where the shocks were given. Follow these instructions at home:  Do not drive for 24 hours if you were given a sedative during your procedure.  Take over-the-counter and prescription medicines only as told by your health care provider.  Ask your health care provider how to check your pulse. Check it often.  Rest for 48 hours after the procedure   or as told by your health care provider.  Avoid or limit your caffeine use as told by your health care provider.  Keep all follow-up visits as told by your health care provider. This is important. Contact a health care provider if:  You feel like your heart is beating too quickly or your pulse is not regular.  You have a serious muscle cramp that does not go  away. Get help right away if:  You have discomfort in your chest.  You are dizzy or you feel faint.  You have trouble breathing or you are short of breath.  Your speech is slurred.  You have trouble moving an arm or leg on one side of your body.  Your fingers or toes turn cold or blue. Summary  Electrical cardioversion is the delivery of a jolt of electricity to restore a normal rhythm to the heart.  This procedure may be done right away in an emergency or may be a scheduled procedure if the condition is not an emergency.  Generally, this is a safe procedure.  After the procedure, check your pulse often as told by your health care provider. This information is not intended to replace advice given to you by your health care provider. Make sure you discuss any questions you have with your health care provider. Document Revised: 03/29/2019 Document Reviewed: 03/29/2019 Elsevier Patient Education  2021 [employer] Inc.  

## 2021-01-22 NOTE — Anesthesia Procedure Notes (Signed)
Procedure Name: MAC Date/Time: 01/22/2021 9:11 AM Performed by: Dorthea Cove, CRNA Pre-anesthesia Checklist: Patient identified, Emergency Drugs available, Suction available, Patient being monitored and Timeout performed Patient Re-evaluated:Patient Re-evaluated prior to induction Oxygen Delivery Method: Nasal cannula Preoxygenation: Pre-oxygenation with 100% oxygen Induction Type: IV induction Placement Confirmation: positive ETCO2 and CO2 detector

## 2021-01-22 NOTE — Anesthesia Preprocedure Evaluation (Signed)
Anesthesia Evaluation  Patient identified by MRN, date of birth, ID band Patient awake    Reviewed: Allergy & Precautions, NPO status , Patient's Chart, lab work & pertinent test results  Airway Mallampati: II  TM Distance: >3 FB Neck ROM: Full    Dental  (+) Teeth Intact   Pulmonary neg pulmonary ROS, Current Smoker,    Pulmonary exam normal        Cardiovascular hypertension, Pt. on medications and Pt. on home beta blockers + dysrhythmias Atrial Fibrillation  Rhythm:Irregular Rate:Normal     Neuro/Psych  Headaches, Anxiety Depression  Neuromuscular disease    GI/Hepatic negative GI ROS, Neg liver ROS,   Endo/Other  negative endocrine ROS  Renal/GU Renal disease  negative genitourinary   Musculoskeletal negative musculoskeletal ROS (+)   Abdominal (+)  Abdomen: soft. Bowel sounds: normal.  Peds  Hematology negative hematology ROS (+)   Anesthesia Other Findings   Reproductive/Obstetrics                             Anesthesia Physical Anesthesia Plan  ASA: III  Anesthesia Plan: General   Post-op Pain Management:    Induction: Intravenous  PONV Risk Score and Plan: 1 and Propofol infusion and Treatment may vary due to age or medical condition  Airway Management Planned: Mask  Additional Equipment: None  Intra-op Plan:   Post-operative Plan:   Informed Consent: I have reviewed the patients History and Physical, chart, labs and discussed the procedure including the risks, benefits and alternatives for the proposed anesthesia with the patient or authorized representative who has indicated his/her understanding and acceptance.     Dental advisory given  Plan Discussed with: CRNA  Anesthesia Plan Comments: (Lab Results      Component                Value               Date                      WBC                      9.4                 01/02/2021                HGB                       17.3 (H)            01/02/2021                HCT                      52.9 (H)            01/02/2021                MCV                      104.4 (H)           01/02/2021                PLT                      370.0  01/02/2021           Lab Results      Component                Value               Date                      NA                       142                 01/15/2021                K                        5.0                 01/15/2021                CO2                      18 (L)              01/15/2021                GLUCOSE                  100 (H)             01/15/2021                BUN                      17                  01/15/2021                CREATININE               1.37 (H)            01/15/2021                CALCIUM                  10.1                01/15/2021                GFRNONAA                 59 (L)              12/22/2020                GFRAA                    >60                 02/16/2016          )        Anesthesia Quick Evaluation

## 2021-02-05 NOTE — Progress Notes (Signed)
Cardiology Clinic Note   Patient Name: Daniel Butler Date of Encounter: 02/07/2021  Primary Care Provider:  Olive Bass, FNP Primary Cardiologist:  Chrystie Nose, MD  Patient Profile    Daniel Butler 55 year old male presents to the clinic today for follow-up evaluation status post unsuccessful DCCV.  Past Medical History    Past Medical History:  Diagnosis Date  . Brain aneurysm   . Chicken pox   . Depression   . Gout   . High cholesterol   . Hypertension    Past Surgical History:  Procedure Laterality Date  . CARDIOVERSION N/A 01/22/2021   Procedure: CARDIOVERSION;  Surgeon: Meriam Sprague, MD;  Location: [facility] ENDOSCOPY;  Service: Cardiovascular;  Laterality: N/A;  . CRANIOTOMY Right 03/02/2015   Procedure: CRANIOTOMY INTRACRANIAL  ANEURYSM FOR CLIPPING;  Surgeon: Lisbeth Renshaw, MD;  Location: [facility] NEURO ORS;  Service: Neurosurgery;  Laterality: Right;  . IR GENERIC HISTORICAL  02/16/2016   IR ANGIO VERTEBRAL SEL VERTEBRAL BILAT MOD SED 02/16/2016 Lisbeth Renshaw, MD [facility]-INTERV RAD  . IR GENERIC HISTORICAL  02/16/2016   IR ANGIO INTRA EXTRACRAN SEL INTERNAL CAROTID BILAT MOD SED 02/16/2016 Lisbeth Renshaw, MD [facility]-INTER[facility] RAD  . RADIOLOGY WITH ANESTHESIA N/A 03/02/2015   Procedure: RADIOLOGY WITH ANESTHESIA;  Surgeon: Lisbeth Renshaw, MD;  Location: [facility] OR;  Service: Radiology;  Laterality: N/A;  . RIGHT/LEFT HEART CATH AND CORONARY ANGIOGRAPHY N/A 12/19/2020   Procedure: RIGHT/LEFT HEART CATH AND CORONARY ANGIOGRAPHY;  Surgeon: Swaziland, Peter M, MD;  Location: [facility] INVASIVE CV LAB;  Service: Cardiovascular;  Laterality: N/A;  . WISDOM TOOTH EXTRACTION      Allergies  Allergies  Allergen Reactions  . Zetia [Ezetimibe] Nausea And Vomiting    History of Present Illness    [name] has a PMH of hyperlipidemia, hypertension, brain aneurysm, tobacco use, EtOH use, anxiety, depression, acute combined systolic and diastolic CHF, and atrial  fibrillation with RVR.  He was admitted to the hospital on 12/15/2020 and discharged on 12/23/2020.  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  He underwent cardiac catheterization on 12/19/2020 which showed normal coronary arteries.  He was started on Entresto 24-26, spironolactone was not initiated due to hyperkalemia.  He presented to the clinic 01/15/2021 for follow-up evaluation stated he felt fairly well.  He noted that he previously had dizziness which he felt was slightly worse.  It had been chronic for the past 2 years.  He had been monitoring his diet and reported compliance with his apixaban medication.  He had been compliant with his no lifting over 30 pounds due to his brain aneurysm.  He tried to work part-time but was unable to complete work due to fatigue and lack of fitness.  He had been fairly sedentary at home.  His EKG  showed atrial fibrillation.  It was reviewed with DOD and he was scheduled for DCCV.  Blood pressure was 92/64.  He denied shortness of breath, chest pain, lower extremity edema, orthopnea and PND.  He was instructed to continue to abstain from EtOH.  He presented to the hospital 01/22/2021 for DCCV for cardioversion.  He underwent cardioversion x3 which was unsuccessful.  He presents in the clinic today for follow-up evaluation states he feels well.  We reviewed his unsuccessful cardioversion attempts.  He reports he has been compliant with his apixaban medication.  He denies bleeding issues.  He denies shortness  of breath and activity intolerance.  His heart rate today is 65.  We discussed options for treatment including referral to atrial fibrillation clinic, continue medical therapy, and repeat cardioversion.  He wishes to continue medical therapy at this time.  He reports that he is in the process of filling out patient assistance paperwork for help with his  medications.  We previously discussed that this may be difficult to provide due to his previous [employer].  We will continue his current medications, provide apixaban samples, and have him follow-up with [name] in 6 months.  Today he denies chest pain, shortness of breath, lower extremity edema, fatigue, palpitations, melena, hematuria, hemoptysis, diaphoresis, weakness, presyncope, syncope, orthopnea, and PND.   Home Medications    Prior to Admission medications   Medication Sig Start Date End Date Taking? Authorizing Provider  allopurinol (ZYLOPRIM) 100 MG tablet Take 1 tablet (100 mg total) by mouth daily. 12/11/20   Olive Bass, FNP  ALPRAZolam Prudy Feeler) 0.5 MG tablet TAKE 1 TABLET BY MOUTH THREE TIMES A DAY AS NEEDED FOR ANXIETY Patient taking differently: Take 0.5 mg by mouth 3 (three) times daily as needed for anxiety. 01/12/21   Corwin Levins, MD  amiodarone (PACERONE) 200 MG tablet Take 1 tablet (200 mg total) by mouth 2 (two) times daily. Take 1 pill twice daily for 14 days and then one pill daily thereafter. 01/22/21   Meriam Sprague, MD  apixaban (ELIQUIS) 5 MG TABS tablet Take 1 tablet (5 mg total) by mouth 2 (two) times daily. 01/02/21   Olive Bass, FNP  dapagliflozin propanediol (FARXIGA) 10 MG TABS tablet Take 1 tablet (10 mg total) by mouth daily. 12/24/20   Leroy Sea, MD  fenofibrate (TRICOR) 145 MG tablet Take 1 tablet (145 mg total) by mouth daily. 12/11/20   Olive Bass, FNP  folic acid (FOLVITE) 1 MG tablet Take 1 tablet (1 mg total) by mouth daily. 01/02/21   Olive Bass, FNP  icosapent Ethyl (VASCEPA) 1 g capsule Take 2 capsules (2 g total) by mouth 2 (two) times daily. 11/11/19   Hilty, Lisette Abu, MD  metoprolol succinate (TOPROL-XL) 100 MG 24 hr tablet Take 1 tablet (100 mg total) by mouth daily. Take with or immediately following a meal. 01/02/21   Olive Bass, FNP  rosuvastatin (CRESTOR) 40  MG tablet TAKE 1 TABLET BY MOUTH ONCE DAILY. PATIENT NEEDS OV FOR FURTHER REFILLS Patient taking differently: Take 40 mg by mouth daily. 12/11/20   Hilty, Lisette Abu, MD  sacubitril-valsartan (ENTRESTO) 24-26 MG Take 1 tablet by mouth 2 (two) times daily. 01/02/21   Olive Bass, FNP  thiamine 100 MG tablet Take 1 tablet (100 mg total) by mouth daily. 12/24/20   Leroy Sea, MD    Family History    Family History  Adopted: Yes   is adopted.   Social History    Social History   Socioeconomic History  . Marital status: Married    Spouse name: Esteven Overfelt  . Number of children: 0  . Years of education: 25  . Highest education level: Some college, no degree  Occupational History  . 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   Tobacco Use  . Smoking status: Current Every Day Smoker    Packs/day: 2.50    Years: 25.00    Pack years: 62.50    Types: Cigarettes  . Smokeless tobacco: Never Used  Vaping Use  .  Vaping Use: Never used  Substance and Sexual Activity  . Alcohol use: Yes    Alcohol/week: 70.0 standard drinks    Types: 35 Cans of beer, 35 Shots of liquor per week  . Drug use: No  . Sexual activity: Not Currently    Partners: Female    Birth control/protection: None  Other Topics Concern  . Not on file  Social History Narrative   Denies religious beliefs effecting health care.    Social Determinants of Health   Financial Resource Strain: Low Risk   . Difficulty of Paying Living Expenses: Not hard at all  Food Insecurity: No Food Insecurity  . Worried About Programme researcher, broadcasting/film/video in the Last Year: Never true  . Ran Out of Food in the Last Year: Never true  Transportation Needs: No Transportation Needs  . Lack of Transportation (Medical): No  . Lack of Transportation (Non-Medical): No  Physical Activity: Not on file  Stress: Not on file  Social Connections: Not on file  Intimate Partner Violence: Not on file     Review  of Systems    General:  No chills, fever, night sweats or weight changes.  Cardiovascular:  No chest pain, dyspnea on exertion, edema, orthopnea, palpitations, paroxysmal nocturnal dyspnea. Dermatological: No rash, lesions/masses Respiratory: No cough, dyspnea Urologic: No hematuria, dysuria Abdominal:   No nausea, vomiting, diarrhea, bright red blood per rectum, melena, or hematemesis Neurologic:  No visual changes, wkns, changes in mental status. All other systems reviewed and are otherwise negative except as noted above.  Physical Exam    VS:  BP 90/70   Pulse 65   Ht 6\' 1"  (1.854 m)   Wt 269 lb 6.4 oz (122.2 kg)   SpO2 97%   BMI 35.54 kg/m  , BMI Body mass index is 35.54 kg/m. GEN: Well nourished, well developed, in no acute distress. HEENT: normal. Neck: Supple, no JVD, carotid bruits, or masses. Cardiac: RRR, no murmurs, rubs, or gallops. No clubbing, cyanosis, generalized bilateral lower extremity nonpitting edema.  Radials/DP/PT 2+ and equal bilaterally.  Respiratory:  Respirations regular and unlabored, clear to auscultation bilaterally. GI: Soft, nontender, nondistended, BS + x 4. MS: no deformity or atrophy. Skin: warm and dry, no rash. Neuro:  Strength and sensation are intact. Psych: Normal affect.  Accessory Clinical Findings    Recent Labs: 12/15/2020: TSH 2.377 12/21/2020: B Natriuretic Peptide 411.2; Magnesium 2.0 01/02/2021: ALT 19; Hemoglobin 17.3; Platelets 370.0 01/15/2021: BUN 17; Creatinine, Ser 1.37; Potassium 5.0; Sodium 142   Recent Lipid Panel    Component Value Date/Time   CHOL 107 11/13/2020 1041   TRIG 233 (H) 11/13/2020 1041   HDL 33 (L) 11/13/2020 1041   CHOLHDL 3.5 10/20/2019 1212   CHOLHDL 4 02/22/2019 1444   VLDL 53.8 (H) 02/22/2019 1444   LDLCALC 38 11/13/2020 1041   LDLDIRECT 48 10/20/2019 1212   LDLDIRECT 58.0 02/22/2019 1444    ECG personally reviewed by me today-atrial fibrillation incomplete right bundle branch block 95 bpm- No  acute changes  EKG 01/15/2021 atrial fibrillation with RVR incomplete right bundle branch block 120 bpm- No acute changes  Echocardiogram 12/17/20 IMPRESSIONS    1. Left ventricular ejection fraction, by estimation, is 30 to 35%. The  left ventricle has moderately decreased function. The left ventricle  demonstrates global hypokinesis. Left ventricular diastolic function could  not be evaluated.  2. Right ventricular systolic function is normal. The right ventricular  size is normal. There is normal pulmonary artery systolic pressure.  3. Right atrial size was mildly dilated.  4. The mitral valve is normal in structure. No evidence of mitral valve  regurgitation.  5. The aortic valve is normal in structure. Aortic valve regurgitation is  not visualized.  Cardiac catheterization 12/19/2020  There is moderate left ventricular systolic dysfunction.  LV end diastolic pressure is normal.  The left ventricular ejection fraction is 35-45% by visual estimate.  Hemodynamic findings consistent with mild pulmonary hypertension.  1. Normal coronary anatomy 2. Moderate LV dysfunction. EF estimated at 40% 3. Normal LV filling pressures 4. Mild pulmonary HTN. Mean PAP 24 mm Hg 5. Reduced cardiac output. Co ox 63%. Cardiac index 1.89  Plan: medical management. OK to start oral anticoagulation this pm.  Diagnostic Dominance: Co-dominant     Assessment & Plan   1.  Atrial fibrillation with RVR- EKG today shows atrial fibrillation incomplete right bundle branch block 95 bpm.  CHA2DS2-VASc score 2 for CHF and hypertension.  Reports compliance with apixaban.  Cardiac unaware.  Underwent DCCV 01/15/2021 with 3 shocks and was unsuccessful.   Wishes to defer A. fib clinic and future DCCV at this time. Stop amiodarone Start metoprolol 50 mg p.m. and continue 100 mg a.m. Continue apixaban, metoprolol Heart healthy low-sodium diet-salty 6 given Increase physical activity as  tolerated   New acute combined systolic and diastolic CHF-  no lower extremity edema.  Breathing stable at baseline.  Echocardiogram showed an EF of 30-35%.  It was felt that his new onset CHF was secondary to A. fib RVR or EtOH cardiomyopathy.  His cardiac catheterization showed normal coronary arteries.  He was started on Entresto however, spironolactone was not started due to hyperkalemia.  BMP continued to have potassium in the 5.0 range with a creatinine of 1.37. Continue Entresto, metoprolol, Farxiga Heart healthy low-sodium diet-salty 6 given Increase physical activity as tolerated Daily weights  AKI- creatinine 1. 37 on 01/15/2021.  Baseline appears to be 1.4-1.50. Follows with PCP  Disposition: Follow-up with [name] in 4-6 months.   Thomasene Ripple. Kanai Berrios NP-C    02/07/2021, 10:56 AM Procedure [facility] [address] Office [phone] Fax [phone]  Notice: This dictation was prepared with Dragon dictation along with smaller phrase technology. Any transcriptional errors that result from this process are unintentional and may not be corrected upon review.  I spent 15 minutes examining this patient, reviewing medications, and using patient centered shared decision making involving her cardiac care.  Prior to her visit I spent greater than 20 minutes reviewing her past medical history,  medications, and prior cardiac tests.

## 2021-02-07 ENCOUNTER — Encounter: Payer: Self-pay | Admitting: General Practice

## 2021-02-07 ENCOUNTER — Other Ambulatory Visit: Payer: Self-pay

## 2021-02-07 ENCOUNTER — Ambulatory Visit (INDEPENDENT_AMBULATORY_CARE_PROVIDER_SITE_OTHER): Payer: Self-pay | Admitting: General Practice

## 2021-02-07 VITALS — BP 90/70 | HR 65 | Ht 73.0 in | Wt 269.4 lb

## 2021-02-07 DIAGNOSIS — I4891 Unspecified atrial fibrillation: Secondary | ICD-10-CM

## 2021-02-07 DIAGNOSIS — I5041 Acute combined systolic (congestive) and diastolic (congestive) heart failure: Secondary | ICD-10-CM

## 2021-02-07 DIAGNOSIS — N179 Acute kidney failure, unspecified: Secondary | ICD-10-CM

## 2021-02-07 MED ORDER — METOPROLOL SUCCINATE ER 100 MG PO TB24: ORAL_TABLET | ORAL | 3 refills | Status: DC

## 2021-02-07 NOTE — Patient Instructions (Addendum)
Medication Instructions:  STOP taking amiodarone. Take metoprolol succinate 100mg  (1 tablet) in the morning, and 50mg  (one half tablet) in the afternoon.  *If you need a refill on your cardiac medications before your next appointment, please call your pharmacy*   Lab Work: None ordered.    Testing/Procedures: None ordered.    Follow-Up: At [facility], you and your health needs are our priority.  As part of our continuing mission to provide you with exceptional heart care, we have created designated Provider Care Teams.  These Care Teams include your primary Cardiologist (physician) and Advanced Practice Providers (APPs -  Physician Assistants and Nurse Practitioners) who all work together to provide you with the care you need, when you need it.  We recommend signing up for the patient portal called "MyChart".  Sign up information is provided on this After Visit Summary.  MyChart is used to connect with patients for Virtual Visits (Telemedicine).  Patients are able to view lab/test results, encounter notes, upcoming appointments, etc.  Non-urgent messages can be sent to your provider as well.   To learn more about what you can do with MyChart, go to .    Your next appointment:   4-6 month(s)  The format for your next appointment:   In Person  Provider:   [facility] Hilty, MD   Other Instructions Samples of Eliquis 5mg  were given to the patient, quantity 2 boxes, Lot Number [id number]

## 2021-02-12 ENCOUNTER — Other Ambulatory Visit: Payer: Self-pay | Admitting: Internal Medicine

## 2021-02-12 DIAGNOSIS — F411 Generalized anxiety disorder: Secondary | ICD-10-CM

## 2021-02-12 NOTE — Telephone Encounter (Signed)
Ok to contact pt  Please to make ROV for further refills

## 2021-02-14 ENCOUNTER — Encounter: Payer: Self-pay | Admitting: Internal Medicine

## 2021-02-15 ENCOUNTER — Telehealth: Payer: Self-pay | Admitting: General Practice

## 2021-02-15 NOTE — Telephone Encounter (Signed)
*  STAT* If patient is at the pharmacy, call can be transferred to refill team.   1. Which medications need to be refilled? (please list name of each medication and dose if known) *new prescription for Farxiga 10 mg  2. Which pharmacy/location (including street and city if local pharmacy) is medication to be sent to?[facility] [place]  3. Do they need a 30 day or 90 day supply?  30 days and refills

## 2021-02-15 NOTE — Telephone Encounter (Signed)
Patient called to follow up on the refill request. Patient only has 6 days left and wants to make sure he does not run out of medication

## 2021-02-16 ENCOUNTER — Other Ambulatory Visit: Payer: Self-pay

## 2021-02-16 MED ORDER — DAPAGLIFLOZIN PROPANEDIOL 10 MG PO TABS: 10.0000 mg | ORAL_TABLET | Freq: Every day | ORAL | 3 refills | Status: DC

## 2021-02-16 NOTE — Telephone Encounter (Signed)
Refilled medication- Marcelline Deist used to help with heart failure- on visit 06/01 with Edd Fabian, NP he suggested to continue- refill sent to pharmacy.  Thank you!

## 2021-02-16 NOTE — Telephone Encounter (Signed)
Patient called back to say the when he called the hospital they told him the resident phys couldn't write him a refill prescription. They told him that physician had to. Patient also says states that he only has 4 pills left. Please advise

## 2021-02-16 NOTE — Telephone Encounter (Signed)
Called patient, notified that we had sent it to the pharmacy.  Patient verbalized understanding.

## 2021-02-21 ENCOUNTER — Ambulatory Visit: Payer: Self-pay | Admitting: Neurology

## 2021-03-07 ENCOUNTER — Telehealth: Payer: Self-pay

## 2021-03-07 ENCOUNTER — Telehealth: Payer: Self-pay | Admitting: Family

## 2021-03-07 ENCOUNTER — Other Ambulatory Visit: Payer: Self-pay

## 2021-03-07 DIAGNOSIS — F411 Generalized anxiety disorder: Secondary | ICD-10-CM

## 2021-03-07 MED ORDER — ALPRAZOLAM 0.5 MG PO TABS
0.5000 mg | ORAL_TABLET | Freq: Three times a day (TID) | ORAL | 0 refills | Status: DC | PRN
Start: 2021-03-07 — End: 2021-04-13

## 2021-03-07 NOTE — Telephone Encounter (Signed)
Pt rx request has been addressed.

## 2021-03-07 NOTE — Telephone Encounter (Signed)
Patient called requesting refill on alprazolam. He states pharmacy is faxing request to previous office in error. He is aware last refill 02/12/21 however he states if you could refill it but note to hold until fill date.   Per last note patient needed an appointment for further refills. I have scheduled him to see you Friday per patient request for refill.   Medication has been pended.

## 2021-03-07 NOTE — Telephone Encounter (Signed)
I don't need to see him on Friday. We can refill the Xanax as requested for now. He would need an OV by September/ October however.  Okay to cancel the appt for 7/1 since he is being followed closely by cardiology at this time.

## 2021-03-07 NOTE — Telephone Encounter (Signed)
Rx has been sent request has been sent to provider.   Pt is now scheduled for 06/01/21 @ 1:20pm.

## 2021-03-07 NOTE — Telephone Encounter (Signed)
Caller states he is having trouble getting his Alprazolam. They are sending it to the wrong place

## 2021-03-09 ENCOUNTER — Telehealth: Payer: Self-pay | Admitting: Physician Assistant

## 2021-03-09 ENCOUNTER — Ambulatory Visit: Payer: Self-pay | Admitting: Family

## 2021-03-09 NOTE — Telephone Encounter (Signed)
Spoke with patient who reports his wife dropped off assistance application for Eliquis & Entresto on Wednesday 6/29. These have not been given to [name] or placed in his basket.   He also reports Eliquis app was faxed in as the company said there is info needing to be added/edited.   Advised will call [employer] to follow up on this

## 2021-03-09 NOTE — Telephone Encounter (Signed)
Spoke with [employer] and was notified collaborating MD needs to sign form - they will fax over for MD to sign. Patient also needs to verify income with [employer]  Called patient and notified him of this

## 2021-03-09 NOTE — Telephone Encounter (Signed)
Pt is calling back to f/u on the paperwork that was supposed to be faxed back to [employer], pt states their was some missing information on the paperwork. Please advise pt further

## 2021-03-14 NOTE — Telephone Encounter (Signed)
Patient assistance applications for Daniel Butler and MD portion of Eliquis application faxed to respective companies

## 2021-03-15 NOTE — Telephone Encounter (Signed)
Patient approved for eliquis free of charge from [employer] from 03/15/2021 - 03/14/2022

## 2021-03-19 NOTE — Telephone Encounter (Signed)
Patient approved for entresto assistance from [employer] until 03/16/22

## 2021-04-13 ENCOUNTER — Other Ambulatory Visit: Payer: Self-pay | Admitting: Internal Medicine

## 2021-04-13 DIAGNOSIS — F411 Generalized anxiety disorder: Secondary | ICD-10-CM

## 2021-04-13 NOTE — Telephone Encounter (Signed)
Alprazolam  Last Visit: 10/20/20 Next Visit: none Last Filled: 03/14/21  PMP done please advise

## 2021-04-20 ENCOUNTER — Other Ambulatory Visit: Payer: Self-pay | Admitting: Family

## 2021-04-23 ENCOUNTER — Telehealth: Payer: Self-pay

## 2021-04-23 NOTE — Telephone Encounter (Signed)
Pt following up on pharmacy request for folic acid refill.  Pharmacy is [facility] in [place].

## 2021-04-24 MED ORDER — FOLIC ACID 1 MG PO TABS: 1.0000 mg | ORAL_TABLET | Freq: Every day | ORAL | 0 refills | Status: DC

## 2021-04-24 NOTE — Telephone Encounter (Signed)
Rx has been sent to pharmacy

## 2021-05-12 ENCOUNTER — Other Ambulatory Visit: Payer: Self-pay | Admitting: Internal Medicine

## 2021-05-12 DIAGNOSIS — F411 Generalized anxiety disorder: Secondary | ICD-10-CM

## 2021-05-12 NOTE — Telephone Encounter (Signed)
Please to contact pt  Xanax refill done #30 only, and this is last refill  Pt needs ROV due to prior provider no longer at the practice and this is a controlled substance

## 2021-05-14 ENCOUNTER — Encounter: Payer: Self-pay | Admitting: Family

## 2021-05-14 DIAGNOSIS — F411 Generalized anxiety disorder: Secondary | ICD-10-CM

## 2021-05-15 ENCOUNTER — Other Ambulatory Visit: Payer: Self-pay

## 2021-05-15 ENCOUNTER — Other Ambulatory Visit: Payer: Self-pay | Admitting: Family

## 2021-05-15 DIAGNOSIS — F411 Generalized anxiety disorder: Secondary | ICD-10-CM

## 2021-05-15 MED ORDER — ALPRAZOLAM 0.5 MG PO TABS: 0.5000 mg | ORAL_TABLET | Freq: Three times a day (TID) | ORAL | 0 refills | Status: DC

## 2021-05-15 NOTE — Telephone Encounter (Signed)
Pt [facility] [place]. Selena Batten is calling [facility] to cancel it now.

## 2021-05-15 NOTE — Telephone Encounter (Signed)
Rx has been sent.     Pt notified.

## 2021-05-15 NOTE — Telephone Encounter (Signed)
I have called pt back and informed him that I was working on his med refill and the message has been sent to the provider. He stated that he called a few more people to verified why he only got 30 tabs vs the 90 he was expecting.   I have informed him that the rx has been sent to pharmacy and explained that the rx was sent to a different office. He said okay.   I asked if he needed any additional clarification and he then asked what I was wearing. I said bye and disconnected the call.   

## 2021-05-15 NOTE — Telephone Encounter (Signed)
Encounter Open in ERROR

## 2021-05-15 NOTE — Telephone Encounter (Addendum)
[facility]. We have [facility] it for the [facility].   Controlled.

## 2021-05-15 NOTE — Telephone Encounter (Signed)
I have called pt back and informed to gather some more information. Pt stated "I need my medication because if I do not have it 3 times a day I will go crazy", "I do have an appointment coming up and would like a call when this is fixed please". I have informed him that the rx may have been sent to the wrong office. I will pend the updated script so he can have the rest of the meds sent to his pharmacy. Pt reports that he take Alprazolam0.5mg  TID. He only has enough for 10 days and has already taken 2 out of his 30 tabs.   Rx has been pended for pt.

## 2021-05-15 NOTE — Telephone Encounter (Signed)
Requesting: alprazolam Last Visit: 01/02/21 Next Visit: 06/01/21 Last Refill: 05/12/21 by Dr. Jonny Ruiz (not sure if they filled with directions)  Please Advise

## 2021-05-15 NOTE — Telephone Encounter (Signed)
Concern has been address on another encounter.

## 2021-05-15 NOTE — Telephone Encounter (Signed)
Pts concern has been addressed in another encounter.

## 2021-05-15 NOTE — Telephone Encounter (Signed)
I have called pt back and informed him that I was working on his med refill and the message has been sent to the provider. He stated that he called a few more people to verified why he only got 30 tabs vs the 90 he was expecting.   I have informed him that the rx has been sent to pharmacy and explained that the rx was sent to a different office. He said okay.   I asked if he needed any additional clarification and he then asked what I was wearing. I said bye and disconnected the call.

## 2021-05-15 NOTE — Telephone Encounter (Signed)
Pt states Dr Jonny Ruiz called in a 10 day supply. He did pick up the 10 day supply so he needs 60 add'l tabs called in. Pt states he is nervous and would like a call back once its handled.

## 2021-06-01 ENCOUNTER — Ambulatory Visit (INDEPENDENT_AMBULATORY_CARE_PROVIDER_SITE_OTHER): Payer: Self-pay | Admitting: Family

## 2021-06-01 ENCOUNTER — Other Ambulatory Visit: Payer: Self-pay

## 2021-06-01 ENCOUNTER — Encounter: Payer: Self-pay | Admitting: Family

## 2021-06-01 VITALS — BP 112/84 | HR 81 | Temp 98.1°F | Ht 73.0 in | Wt 295.4 lb

## 2021-06-01 DIAGNOSIS — I10 Essential (primary) hypertension: Secondary | ICD-10-CM

## 2021-06-01 DIAGNOSIS — M1000 Idiopathic gout, unspecified site: Secondary | ICD-10-CM

## 2021-06-01 DIAGNOSIS — E785 Hyperlipidemia, unspecified: Secondary | ICD-10-CM

## 2021-06-01 DIAGNOSIS — F411 Generalized anxiety disorder: Secondary | ICD-10-CM

## 2021-06-01 MED ORDER — FOLIC ACID 1 MG PO TABS: 1.0000 mg | ORAL_TABLET | Freq: Every day | ORAL | 3 refills | Status: DC

## 2021-06-01 MED ORDER — ALLOPURINOL 100 MG PO TABS: 100.0000 mg | ORAL_TABLET | Freq: Every day | ORAL | 3 refills | Status: DC

## 2021-06-01 MED ORDER — FENOFIBRATE 145 MG PO TABS: 145.0000 mg | ORAL_TABLET | Freq: Every day | ORAL | 3 refills | Status: DC

## 2021-06-01 NOTE — Progress Notes (Signed)
Daniel Butler is a 55 y.o. male with the following history as recorded in Epic[employer]:  Patient Active Problem List   Diagnosis Date Noted   Acute combined systolic and diastolic heart failure (HCC)    Alcohol abuse with intoxication (HCC) 12/15/2020   Transient hypotension 12/15/2020   Acute metabolic encephalopathy 12/15/2020   AKI (acute kidney injury) (HCC) 12/15/2020   History of subarachnoid hemorrhage 12/15/2020   Hypoalbuminemia 12/15/2020   Atrial fibrillation (HCC) 12/15/2020   Bilateral lower extremity edema 12/15/2020   Fall at home, initial encounter 12/15/2020   Alcohol abuse 06/02/2018   Tobacco abuse 06/02/2018   Hypertriglyceridemia 01/19/2018   Essential hypertension 10/02/2017   Sciatica of right side 04/11/2017   Sleep disturbance 05/19/2015   Anxiety state 05/19/2015   Generalized headaches 04/18/2015   Gout 04/18/2015   Encounter for orogastric (OG) tube placement    Subarachnoid hemorrhage (HCC) 03/02/2015   Acute respiratory failure with hypoxia (HCC)    SAH (subarachnoid hemorrhage) (HCC)    Subarachnoid bleed (HCC)    Malignant hypertension     Current Outpatient Medications  Medication Sig Dispense Refill   ALPRAZolam (XANAX) 0.5 MG tablet Take 1 tablet (0.5 mg total) by mouth 3 (three) times daily. To supplement the 30 tabs he has already picked up. 60 tablet 0   apixaban (ELIQUIS) 5 MG TABS tablet Take 1 tablet (5 mg total) by mouth 2 (two) times daily. 60 tablet 3   dapagliflozin propanediol (FARXIGA) 10 MG TABS tablet Take 1 tablet (10 mg total) by mouth daily. 30 tablet 3   icosapent Ethyl (VASCEPA) 1 g capsule Take 2 capsules (2 g total) by mouth 2 (two) times daily. 120 capsule 11   metoprolol succinate (TOPROL-XL) 100 MG 24 hr tablet Take 100mg  (1 tablet) in the morning and 50mg  (one half tablet) in the afternoon. 135 tablet 3   rosuvastatin (CRESTOR) 40 MG tablet TAKE 1 TABLET BY MOUTH ONCE DAILY. PATIENT NEEDS OV FOR FURTHER REFILLS  (Patient taking differently: Take 40 mg by mouth daily.) 90 tablet 3   sacubitril-valsartan (ENTRESTO) 24-26 MG Take 1 tablet by mouth 2 (two) times daily. 60 tablet 3   thiamine 100 MG tablet Take 1 tablet (100 mg total) by mouth daily. 30 tablet 0   allopurinol (ZYLOPRIM) 100 MG tablet Take 1 tablet (100 mg total) by mouth daily. 90 tablet 3   fenofibrate (TRICOR) 145 MG tablet Take 1 tablet (145 mg total) by mouth daily. 90 tablet 3   folic acid (FOLVITE) 1 MG tablet Take 1 tablet (1 mg total) by mouth daily. 90 tablet 3   No current facility-administered medications for this visit.    Allergies: Zetia [ezetimibe]  Past Medical History:  Diagnosis Date   Brain aneurysm    Chicken pox    Depression    Gout    High cholesterol    Hypertension     Past Surgical History:  Procedure Laterality Date   CARDIOVERSION N/A 01/22/2021   Procedure: CARDIOVERSION;  Surgeon: , MD;  Location: [facility] ENDOSCOPY;  Service: Cardiovascular;  Laterality: N/A;   CRANIOTOMY Right 03/02/2015   Procedure: CRANIOTOMY INTRACRANIAL  ANEURYSM FOR CLIPPING;  Surgeon: [facility], MD;  Location: [facility] NEURO ORS;  Service: Neurosurgery;  Laterality: Right;   IR GENERIC HISTORICAL  02/16/2016   IR ANGIO VERTEBRAL SEL VERTEBRAL BILAT MOD SED 02/16/2016 04/17/2016, MD [facility]-INTERV RAD   IR GENERIC HISTORICAL  02/16/2016   IR ANGIO INTRA EXTRACRAN SEL INTERNAL  CAROTID BILAT MOD SED 02/16/2016 Lisbeth Renshaw, MD [facility]-INTER[facility] RAD   RADIOLOGY WITH ANESTHESIA N/A 03/02/2015   Procedure: RADIOLOGY WITH ANESTHESIA;  Surgeon: Lisbeth Renshaw, MD;  Location: [facility] OR;  Service: Radiology;  Laterality: N/A;   RIGHT/LEFT HEART CATH AND CORONARY ANGIOGRAPHY N/A 12/19/2020   Procedure: RIGHT/LEFT HEART CATH AND CORONARY ANGIOGRAPHY;  Surgeon: Swaz[place], Peter M, MD;  Location: [facility] INVASIVE CV LAB;  Service: Cardiovascular;  Laterality: N/A;   WISDOM TOOTH EXTRACTION      Family History  Adopted: Yes    Social History    Tobacco Use   Smoking status: Every Day    Packs/day: 2.50    Years: 25.00    Pack years: 62.50    Types: Cigarettes   Smokeless tobacco: Never  Substance Use Topics   Alcohol use: Yes    Alcohol/week: 70.0 standard drinks    Types: 35 Cans of beer, 35 Shots of liquor per week    Subjective:  Accompanied by wife; 6 month follow up on Xanax; also requesting refills on Allopurinol, Tricor and Folic Acid; Planning to see cardiology in December 2022;  Admits he is drinking 3-4 beers per day/ denies use of hard liquor; not ready to quit smoking;    Objective:  Vitals:   06/01/21 1321  BP: 112/84  Pulse: 81  Temp: 98.1 F (36.7 C)  TempSrc: Oral  SpO2: 99%  Weight: 295 lb 6.4 oz (134 kg)  Height: 6\' 1"  (1.854 m)    General: Well developed, well nourished, in no acute distress  Skin : Warm and dry.  Head: Normocephalic and atraumatic  Lungs: Respirations unlabored; clear to auscultation bilaterally without wheeze, rales, rhonchi  CVS exam: irregularly irregular rhythm  Neurologic: Alert and oriented; speech intact; face symmetrical; moves all extremities well; CNII-XII intact without focal deficit   Assessment:  1. Hyperlipidemia, unspecified hyperlipidemia type   2. Idiopathic gout, unspecified chronicity, unspecified site     Plan:  Refills updated; plan for OV in 6 months; stressed need for follow up with cardiology in December 2022;  This visit occurred during the SARS-CoV-2 public health emergency.  Safety protocols were in place, including screening questions prior to the visit, additional usage of staff PPE, and extensive cleaning of exam room while observing appropriate contact time as indicated for disinfecting solutions.    No follow-ups on file.  No orders of the defined types were placed in this encounter.   Requested Prescriptions   Signed Prescriptions Disp Refills   folic acid (FOLVITE) 1 MG tablet 90 tablet 3    Sig: Take 1 tablet (1 mg total) by  mouth daily.   fenofibrate (TRICOR) 145 MG tablet 90 tablet 3    Sig: Take 1 tablet (145 mg total) by mouth daily.   allopurinol (ZYLOPRIM) 100 MG tablet 90 tablet 3    Sig: Take 1 tablet (100 mg total) by mouth daily.

## 2021-06-03 ENCOUNTER — Other Ambulatory Visit: Payer: Self-pay

## 2021-06-03 ENCOUNTER — Emergency Department (HOSPITAL_COMMUNITY)
Admission: EM | Admit: 2021-06-03 | Discharge: 2021-06-03 | Disposition: A | Discharge status: Home / Self Care | Payer: Self-pay | Attending: Emergency Medicine | Admitting: Emergency Medicine

## 2021-06-03 ENCOUNTER — Encounter (HOSPITAL_COMMUNITY): Payer: Self-pay | Admitting: Emergency Medicine

## 2021-06-03 DIAGNOSIS — Z7901 Long term (current) use of anticoagulants: Secondary | ICD-10-CM | POA: Insufficient documentation

## 2021-06-03 DIAGNOSIS — X58XXXA Exposure to other specified factors, initial encounter: Secondary | ICD-10-CM | POA: Insufficient documentation

## 2021-06-03 DIAGNOSIS — I110 Hypertensive heart disease with heart failure: Secondary | ICD-10-CM | POA: Insufficient documentation

## 2021-06-03 DIAGNOSIS — S01512A Laceration without foreign body of oral cavity, initial encounter: Secondary | ICD-10-CM | POA: Insufficient documentation

## 2021-06-03 DIAGNOSIS — I5041 Acute combined systolic (congestive) and diastolic (congestive) heart failure: Secondary | ICD-10-CM | POA: Insufficient documentation

## 2021-06-03 DIAGNOSIS — Z955 Presence of coronary angioplasty implant and graft: Secondary | ICD-10-CM | POA: Insufficient documentation

## 2021-06-03 DIAGNOSIS — Z87891 Personal history of nicotine dependence: Secondary | ICD-10-CM | POA: Insufficient documentation

## 2021-06-03 DIAGNOSIS — Z79899 Other long term (current) drug therapy: Secondary | ICD-10-CM | POA: Insufficient documentation

## 2021-06-03 NOTE — ED Notes (Signed)
Pt refusing labs and further treatment. Wants to leave. Has called wife for pick-up. PA Keller notified.

## 2021-06-03 NOTE — Discharge Instructions (Addendum)
You were seen here today for a tongue laceration. Attached to this document are dental resources for you to call and continue your care. You can place dental or orthodontic wax over the sharp tooth to prevent further cuts. If you have any concern, new or worsening symptoms, please present to the ED.

## 2021-06-03 NOTE — ED Provider Notes (Signed)
Emergency Medicine Provider Triage Evaluation Note  Daniel Butler , a 55 y.o. male  was evaluated in triage.  Pt complains of bleeding, on Eliquis.  He states that he was using an oven cleaner yesterday at 3 PM when he felt something pop in his mouth and has been spitting up blood ever since.  States that he can blow his nose there is no blood coming out of his nose however if he spits there is blood in his saliva..  Review of Systems  Positive: Mouth bleeding Negative: Injury or pain  Physical Exam  BP (!) 134/99 (BP Location: Right Arm)   Pulse 99   Temp 98.6 F (37 C) (Oral)   Resp 20   SpO2 99%  Gen:   Awake, no distress   Resp:  Normal effort  MSK:   Moves extremities without difficulty  Other:  Mouth was rinsed, poor dental hygiene with broken teeth on the right, unclear if bleeding is coming from these teeth versus oropharynx.  Teabag placed on right lower molars  Medical Decision Making  Medically screening exam initiated at 6:43 AM.  Appropriate orders placed.  DICK HARK was informed that the remainder of the evaluation will be completed by another provider, this initial triage assessment does not replace that evaluation, and the importance of remaining in the ED until their evaluation is complete.     Jeannie Fend, PA-C 06/03/21 1916    Wynetta Fines, MD 06/04/21 [phone]

## 2021-06-03 NOTE — ED Triage Notes (Addendum)
Patient was cleaning an oven with oven cleaner yesterday at 3pm.  Patient now with bleeding from mouth since.  No pain, patient able to spit blood out into receptacle.  Patient vitals signs are stable with EMS.  CAOx4.  Patient on Eliquis.

## 2021-06-04 NOTE — ED Provider Notes (Signed)
[facility] EMERGENCY DEPARTMENT Provider Note   CSN: [id number] Arrival date & time: 06/03/21  6378     History Chief Complaint  Patient presents with   Bleeding/Bruising    VIN Daniel Butler is a 55 y.o. male on Eliquis presenting to the ED for evaluation of right-sided tongue laceration.  Patient reports it has been mildly bleeding off and on since the night before around 1900.  The patient reports that he thinks he may have cut his tongue with his tooth.  The patient denies any nausea, vomiting, sore throat, cough, dizziness, lightheadedness, or weakness.  Denies any dental pain.  HPI     Past Medical History:  Diagnosis Date   Brain aneurysm    Chicken pox    Depression    Gout    High cholesterol    Hypertension     Patient Active Problem List   Diagnosis Date Noted   Acute combined systolic and diastolic heart failure (HCC)    Alcohol abuse with intoxication (HCC) 12/15/2020   Transient hypotension 12/15/2020   Acute metabolic encephalopathy 12/15/2020   AKI (acute kidney injury) (HCC) 12/15/2020   History of subarachnoid hemorrhage 12/15/2020   Hypoalbuminemia 12/15/2020   Atrial fibrillation (HCC) 12/15/2020   Bilateral lower extremity edema 12/15/2020   Fall at home, initial encounter 12/15/2020   Alcohol abuse 06/02/2018   Tobacco abuse 06/02/2018   Hypertriglyceridemia 01/19/2018   Essential hypertension 10/02/2017   Sciatica of right side 04/11/2017   Sleep disturbance 05/19/2015   Anxiety state 05/19/2015   Generalized headaches 04/18/2015   Gout 04/18/2015   Encounter for orogastric (OG) tube placement    Subarachnoid hemorrhage (HCC) 03/02/2015   Acute respiratory failure with hypoxia (HCC)    SAH (subarachnoid hemorrhage) (HCC)    Subarachnoid bleed (HCC)    Malignant hypertension     Past Surgical History:  Procedure Laterality Date   CARDIOVERSION N/A 01/22/2021   Procedure: CARDIOVERSION;  Surgeon: Meriam Sprague, MD;  Location: [facility] ENDOSCOPY;  Service: Cardiovascular;  Laterality: N/A;   CRANIOTOMY Right 03/02/2015   Procedure: CRANIOTOMY INTRACRANIAL  ANEURYSM FOR CLIPPING;  Surgeon: Lisbeth Renshaw, MD;  Location: [facility] NEURO ORS;  Service: Neurosurgery;  Laterality: Right;   IR GENERIC HISTORICAL  02/16/2016   IR ANGIO VERTEBRAL SEL VERTEBRAL BILAT MOD SED 02/16/2016 Lisbeth Renshaw, MD [facility]   IR GENERIC HISTORICAL  02/16/2016   IR ANGIO INTRA EXTRACRAN SEL INTERNAL CAROTID BILAT MOD SED 02/16/2016 Lisbeth Renshaw, MD [facility]   RADIOLOGY WITH ANESTHESIA N/A 03/02/2015   Procedure: RADIOLOGY WITH ANESTHESIA;  Surgeon: Lisbeth Renshaw, MD;  Location: [facility] OR;  Service: Radiology;  Laterality: N/A;   RIGHT/LEFT HEART CATH AND CORONARY ANGIOGRAPHY N/A 12/19/2020   Procedure: RIGHT/LEFT HEART CATH AND CORONARY ANGIOGRAPHY;  Surgeon: [place], Peter M, MD;  Location: [facility] INVASIVE CV LAB;  Service: Cardiovascular;  Laterality: N/A;   WISDOM TOOTH EXTRACTION         Family History  Adopted: Yes    Social History   Tobacco Use   Smoking status: Every Day    Packs/day: 2.50    Years: 25.00    Pack years: 62.50    Types: Cigarettes   Smokeless tobacco: Never  Vaping Use   Vaping Use: Never used  Substance Use Topics   Alcohol use: Yes    Alcohol/week: 70.0 standard drinks    Types: 35 Cans of beer, 35 Shots of liquor per week   Drug use: No  Home Medications Prior to Admission medications   Medication Sig Start Date End Date Taking? Authorizing Provider  allopurinol (ZYLOPRIM) 100 MG tablet Take 1 tablet (100 mg total) by mouth daily. 06/01/21   Olive Bass, FNP  ALPRAZolam Prudy Feeler) 0.5 MG tablet Take 1 tablet (0.5 mg total) by mouth 3 (three) times daily. To supplement the 30 tabs he has already picked up. 05/15/21   Olive Bass, FNP  apixaban (ELIQUIS) 5 MG TABS tablet Take 1 tablet (5 mg total) by mouth 2 (two) times daily. 01/02/21   Olive Bass, FNP   dapagliflozin propanediol (FARXIGA) 10 MG TABS tablet Take 1 tablet (10 mg total) by mouth daily. 02/16/21   Hilty, Lisette Abu, MD  fenofibrate (TRICOR) 145 MG tablet Take 1 tablet (145 mg total) by mouth daily. 06/01/21   Olive Bass, FNP  folic acid (FOLVITE) 1 MG tablet Take 1 tablet (1 mg total) by mouth daily. 06/01/21   Olive Bass, FNP  icosapent Ethyl (VASCEPA) 1 g capsule Take 2 capsules (2 g total) by mouth 2 (two) times daily. 11/11/19   Hilty, Lisette Abu, MD  metoprolol succinate (TOPROL-XL) 100 MG 24 hr tablet Take 100mg  (1 tablet) in the morning and 50mg  (one half tablet) in the afternoon. 02/07/21   , NP  rosuvastatin (CRESTOR) 40 MG tablet TAKE 1 TABLET BY MOUTH ONCE DAILY. PATIENT NEEDS OV FOR FURTHER REFILLS Patient taking differently: Take 40 mg by mouth daily. 12/11/20   Hilty, Ronney Asters, MD  sacubitril-valsartan (ENTRESTO) 24-26 MG Take 1 tablet by mouth 2 (two) times daily. 01/02/21   Lisette Abu, FNP  thiamine 100 MG tablet Take 1 tablet (100 mg total) by mouth daily. 12/24/20   Olive Bass, MD    Allergies    Zetia [ezetimibe]  Review of Systems   Review of Systems  Constitutional:  Negative for chills and fever.  HENT:  Negative for dental problem, ear pain and sore throat.   Eyes:  Negative for pain and visual disturbance.  Respiratory:  Negative for cough and shortness of breath.   Cardiovascular:  Negative for chest pain and palpitations.  Gastrointestinal:  Negative for abdominal pain and vomiting.  Genitourinary:  Negative for dysuria and hematuria.  Musculoskeletal:  Negative for arthralgias and back pain.  Skin:  Negative for color change and rash.  Neurological:  Negative for seizures and syncope.  Hematological:  Bruises/bleeds easily.  All other systems reviewed and are negative.  Physical Exam Updated Vital Signs BP 129/88   Pulse (!) 102   Temp 98.7 F (37.1 C) (Oral)   Resp 18   SpO2 96%   Physical  Exam Vitals and nursing note reviewed.  Constitutional:      General: He is not in acute distress.    Appearance: Normal appearance. He is not ill-appearing or toxic-appearing.  HENT:     Head: Normocephalic and atraumatic.     Mouth/Throat:     Mouth: Mucous membranes are moist.     Comments: Extremely poor dentition.  Patient has multiple cracked and broken teeth with gum recession.  No abscess palpated.  On the patient's right lateral tongue that there is a thickened area with a small superficial laceration.  The laceration is no longer bleeding. Eyes:     General: No scleral icterus. Cardiovascular:     Rate and Rhythm: Normal rate and regular rhythm.  Pulmonary:     Effort: Pulmonary effort is normal.  Breath sounds: Normal breath sounds.  Abdominal:     General: Abdomen is flat. Bowel sounds are normal.     Palpations: Abdomen is soft.  Musculoskeletal:        General: No deformity.     Cervical back: Normal range of motion.  Skin:    General: Skin is warm and dry.  Neurological:     General: No focal deficit present.     Mental Status: He is alert. Mental status is at baseline.    ED Results / Procedures / Treatments   Labs (all labs ordered are listed, but only abnormal results are displayed) Labs Reviewed - No data to display  EKG None  Radiology No results found.  Procedures Procedures   Medications Ordered in ED Medications - No data to display  ED Course  I have reviewed the triage vital signs and the nursing notes.  Pertinent labs & imaging results that were available during my care of the patient were reviewed by me and considered in my medical decision making (see chart for details  ASHTAN GIRTMAN is a 55 year old man on Eliquis presenting for right lateral tongue laceration.  Bleeding has been under control with teabag.  Ordered CBC and CMP to check on patient's hemoglobin level.  Patient declined labs and reported he would like to go home.   Discussed the importance of these labs personally, but patient still refused.  Recommended Ortho Donnatal or dental wax to cover broken teeth to prevent further lacerations to the right side of tongue.  Dental resources given.  Return precautions given including not limited to bleeding and or fever.  Patient agrees with plan.  Patient is stable being discharged home in good condition.    MDM Rules/Calculators/A&P  Final Clinical Impression(s) / ED Diagnoses Final diagnoses:  Chronic anticoagulation  Laceration of tongue, initial encounter    Rx / DC Orders ED Discharge Orders     None        Achille Rich, PA-C 06/04/21 2310    Jacalyn Lefevre, MD 06/06/21 [phone]

## 2021-06-12 ENCOUNTER — Other Ambulatory Visit: Payer: Self-pay

## 2021-06-12 DIAGNOSIS — F411 Generalized anxiety disorder: Secondary | ICD-10-CM

## 2021-06-12 MED ORDER — ALPRAZOLAM 0.5 MG PO TABS: 0.5000 mg | ORAL_TABLET | Freq: Three times a day (TID) | ORAL | 0 refills | Status: DC

## 2021-06-12 NOTE — Telephone Encounter (Signed)
I have called pt back and informed him that we will send the rx and it will be ready at [facility] on Thursday. It will be 90 tabs.   Pt reports understanding and picked up the rx on Thursday.

## 2021-06-12 NOTE — Telephone Encounter (Signed)
Pt called stating he is needing a refill on lorazepam sent to [facility] in the [facility].  Pt was persistent about refill stating he needs qty of 90 and stated Dr. Jonny Ruiz had filled script in the past but had not given him a full qty of 75 and Vernona Rieger had filled it for qty 60 and she was going to fix it to qty 90 this time.  Pt continued to be persistent about when he would get the refill and I advised refill is not due until 10/6 and if he had not heard anything from his pharmacy or mychart, he could check back with office then.

## 2021-06-19 ENCOUNTER — Telehealth: Payer: Self-pay | Admitting: Internal Medicine

## 2021-06-19 NOTE — Telephone Encounter (Signed)
Pt c/o medication issue:  1. Name of Medication: dapagliflozin propanediol (FARXIGA) 10 MG TABS tablet  2. How are you currently taking this medication (dosage and times per day)? 1 tablet daily  3. Are you having a reaction (difficulty breathing--STAT)? no  4. What is your medication issue? Patient states he has been given meds free from [facility], but this time while trying to contact them he states he has sat on hold for 2 hours and no one answers the phone. He would like to know if there is another contact number to be able to speak with someone.

## 2021-06-19 NOTE — Telephone Encounter (Signed)
Returned the call to the patient. He stated that he has been placed on hold with [facility] for a refill. He wanted to know if there was another number he could try. He was advised that he would need to call the number listed and that the website stated that they were having high numbers of call right now so unfortunately he may have to wait on hold.

## 2021-07-16 ENCOUNTER — Other Ambulatory Visit: Payer: Self-pay | Admitting: Family

## 2021-07-16 DIAGNOSIS — F411 Generalized anxiety disorder: Secondary | ICD-10-CM

## 2021-08-13 ENCOUNTER — Other Ambulatory Visit: Payer: Self-pay | Admitting: Family

## 2021-08-13 DIAGNOSIS — F411 Generalized anxiety disorder: Secondary | ICD-10-CM

## 2021-08-13 NOTE — Telephone Encounter (Signed)
Requesting: alprazolam 0.5mg  Contract: None UDS: None Last Visit: 06/01/2021 Next Visit: 11/30/2021 Last Refill: 07/16/2021 #90 and 0RF  Please Advise

## 2021-08-22 NOTE — Progress Notes (Deleted)
Cardiology Clinic Note   Patient Name: Daniel Butler Date of Encounter: 08/22/2021  Primary Care Provider:  Marrian Salvage, [place] Primary Cardiologist:  Pixie Casino, MD  Patient Profile    Daniel Butler 55 year old male presents to the clinic today for follow-up evaluation of his atrial fibrillation.  He is status post unsuccessful DCCV 5/22.  Past Medical History    Past Medical History:  Diagnosis Date   Brain aneurysm    Chicken pox    Depression    Gout    High cholesterol    Hypertension    Past Surgical History:  Procedure Laterality Date   CARDIOVERSION N/A 01/22/2021   Procedure: CARDIOVERSION;  Surgeon: Freada Bergeron, MD;  Location: [facility] ENDOSCOPY;  Service: Cardiovascular;  Laterality: N/A;   CRANIOTOMY Right 03/02/2015   Procedure: CRANIOTOMY INTRACRANIAL  ANEURYSM FOR CLIPPING;  Surgeon: Consuella Lose, MD;  Location: [place] NEURO ORS;  Service: Neurosurgery;  Laterality: Right;   IR GENERIC HISTORICAL  02/16/2016   IR ANGIO VERTEBRAL SEL VERTEBRAL BILAT MOD SED 02/16/2016 Consuella Lose, MD [facility]INTER[facility] RAD   IR GENERIC HISTORICAL  02/16/2016   IR ANGIO INTRA EXTRACRAN SEL INTERNAL CAROTID BILAT MOD SED 02/16/2016 Consuella Lose, MD [facility]   RADIOLOGY WITH ANESTHESIA N/A 03/02/2015   Procedure: RADIOLOGY WITH ANESTHESIA;  Surgeon: Consuella Lose, MD;  Location: [place];  Service: Radiology;  Laterality: N/A;   RIGHT/LEFT HEART CATH AND CORONARY ANGIOGRAPHY N/A 12/19/2020   Procedure: RIGHT/LEFT HEART CATH AND CORONARY ANGIOGRAPHY;  Surgeon: Martinique, Peter M, MD;  Location: [place] CV LAB;  Service: Cardiovascular;  Laterality: N/A;   WISDOM TOOTH EXTRACTION      Allergies  Allergies  Allergen Reactions   Zetia [Ezetimibe] Nausea And Vomiting    History of Present Illness    Daniel Butler has a PMH of hyperlipidemia, hypertension, brain aneurysm, tobacco use, EtOH use, anxiety, depression, acute combined systolic and  diastolic CHF, and atrial fibrillation with RVR.   He was admitted to the hospital on 12/15/2020 and discharged on 12/23/2020.  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  He underwent cardiac catheterization on 12/19/2020 which showed normal coronary arteries.  He was started on Entresto 24-26, spironolactone was not initiated due to hyperkalemia.   He presented to the clinic 01/15/2021 for follow-up evaluation stated he felt fairly well.  He noted that he previously had dizziness which he felt was slightly worse.  It had been chronic for the past 2 years.  He had been monitoring his diet and reported compliance with his apixaban medication.  He had been compliant with his no lifting over 30 pounds due to his brain aneurysm.  He tried to work part-time but was unable to complete work due to fatigue and lack of fitness.  He had been fairly sedentary at home.  His EKG  showed atrial fibrillation.  It was reviewed with DOD and he was scheduled for DCCV.  Blood pressure was 92/64.  He denied shortness of breath, chest pain, lower extremity edema, orthopnea and PND.  He was instructed to continue to abstain from EtOH.  He presented to the hospital 01/22/2021 for DCCV for cardioversion.  He underwent cardioversion x3 which was unsuccessful.  He presented to the clinic 02/07/2021 for follow-up evaluation stated he felt well.  We reviewed his unsuccessful cardioversion attempts.  He reported he had been compliant with his apixaban  medication.  He denied bleeding issues.  He denies shortness of breath and activity intolerance.  His heart rate was 65.  We discussed options for treatment including referral to atrial fibrillation clinic, continue medical therapy, and repeat cardioversion.  He wished to continue medical therapy at this time.  He reported that he was in the process of filling out patient assistance  paperwork for help with his medications.  We previously discussed that this may be difficult to provide due to his previous [employer].  We  continued his current medications, provided apixaban samples, and planned follow-up with [name] in 6 months.  He presents to the clinic today for follow-up evaluation and states***  Today he denies chest pain, shortness of breath, lower extremity edema, fatigue, palpitations, melena, hematuria, hemoptysis, diaphoresis, weakness, presyncope, syncope, orthopnea, and PND.   Home Medications    Prior to Admission medications   Medication Sig Start Date End Date Taking? Authorizing Provider  allopurinol (ZYLOPRIM) 100 MG tablet Take 1 tablet (100 mg total) by mouth daily. 12/11/20   Olive Bass, FNP  ALPRAZolam Prudy Feeler) 0.5 MG tablet TAKE 1 TABLET BY MOUTH THREE TIMES A DAY AS NEEDED FOR ANXIETY Patient taking differently: Take 0.5 mg by mouth 3 (three) times daily as needed for anxiety. 01/12/21   Corwin Levins, MD  amiodarone (PACERONE) 200 MG tablet Take 1 tablet (200 mg total) by mouth 2 (two) times daily. Take 1 pill twice daily for 14 days and then one pill daily thereafter. 01/22/21   Meriam Sprague, MD  apixaban (ELIQUIS) 5 MG TABS tablet Take 1 tablet (5 mg total) by mouth 2 (two) times daily. 01/02/21   Olive Bass, FNP  dapagliflozin propanediol (FARXIGA) 10 MG TABS tablet Take 1 tablet (10 mg total) by mouth daily. 12/24/20   Leroy Sea, MD  fenofibrate (TRICOR) 145 MG tablet Take 1 tablet (145 mg total) by mouth daily. 12/11/20   Olive Bass, FNP  folic acid (FOLVITE) 1 MG tablet Take 1 tablet (1 mg total) by mouth daily. 01/02/21   Olive Bass, FNP  icosapent Ethyl (VASCEPA) 1 g capsule Take 2 capsules (2 g total) by mouth 2 (two) times daily. 11/11/19   Hilty, Lisette Abu, MD  metoprolol succinate (TOPROL-XL) 100 MG 24 hr tablet Take 1 tablet (100 mg total) by mouth daily. Take with  or immediately following a meal. 01/02/21   Olive Bass, FNP  rosuvastatin (CRESTOR) 40 MG tablet TAKE 1 TABLET BY MOUTH ONCE DAILY. PATIENT NEEDS OV FOR FURTHER REFILLS Patient taking differently: Take 40 mg by mouth daily. 12/11/20   Hilty, Lisette Abu, MD  sacubitril-valsartan (ENTRESTO) 24-26 MG Take 1 tablet by mouth 2 (two) times daily. 01/02/21   Olive Bass, FNP  thiamine 100 MG tablet Take 1 tablet (100 mg total) by mouth daily. 12/24/20   Leroy Sea, MD    Family History    Family History  Adopted: Yes   is adopted.   Social History    Social History   Socioeconomic History   Marital status: Married    Spouse name: Tiandre Teall   Number of children: 0   Years of education: 12   Highest education level: Some college, no degree  Occupational History   Occupation: unemployed    Comment: former [employer] worker.   Tobacco Use   Smoking status: Every Day    Packs/day: 2.50    Years: 25.00  Pack years: 62.50    Types: Cigarettes   Smokeless tobacco: Never  Vaping Use   Vaping Use: Never used  Substance and Sexual Activity   Alcohol use: Yes    Alcohol/week: 70.0 standard drinks    Types: 35 Cans of beer, 35 Shots of liquor per week   Drug use: No   Sexual activity: Not Currently    Partners: Female    Birth control/protection: None  Other Topics Concern   Not on file  Social History Narrative   Denies religious beliefs effecting health care.    Social Determinants of Health   Financial Resource Strain: Low Risk    Difficulty of Paying Living Expenses: Not hard at all  Food Insecurity: No Food Insecurity   Worried About Charity fundraiser in the Last Year: Never true   [place] in the Last Year: Never true  Transportation Needs: No Transportation Needs   Lack of Transportation (Medical): No   Lack of Transportation (Non-Medical): No  Physical Activity: Not on file  Stress: Not on file  Social Connections: Not on  file  Intimate Partner Violence: Not on file     Review of Systems    General:  No chills, fever, night sweats or weight changes.  Cardiovascular:  No chest pain, dyspnea on exertion, edema, orthopnea, palpitations, paroxysmal nocturnal dyspnea. Dermatological: No rash, lesions/masses Respiratory: No cough, dyspnea Urologic: No hematuria, dysuria Abdominal:   No nausea, vomiting, diarrhea, bright red blood per rectum, melena, or hematemesis Neurologic:  No visual changes, wkns, changes in mental status. All other systems reviewed and are otherwise negative except as noted above.  Physical Exam    VS:  There were no vitals taken for this visit. , BMI There is no height or weight on file to calculate BMI. GEN: Well nourished, well developed, in no acute distress. HEENT: normal. Neck: Supple, no JVD, carotid bruits, or masses. Cardiac: RRR, no murmurs, rubs, or gallops. No clubbing, cyanosis, generalized bilateral lower extremity nonpitting edema.  Radials/DP/PT 2+ and equal bilaterally.  Respiratory:  Respirations regular and unlabored, clear to auscultation bilaterally. GI: Soft, nontender, nondistended, BS + x 4. MS: no deformity or atrophy. Skin: warm and dry, no rash. Neuro:  Strength and sensation are intact. Psych: Normal affect.  Accessory Clinical Findings    Recent Labs: 12/15/2020: TSH 2.377 12/21/2020: B Natriuretic Peptide 411.2; Magnesium 2.0 01/02/2021: ALT 19; Hemoglobin 17.3; Platelets 370.0 01/15/2021: BUN 17; Creatinine, Ser 1.37; Potassium 5.0; Sodium 142   Recent Lipid Panel    Component Value Date/Time   CHOL 107 11/13/2020 1041   TRIG 233 (H) 11/13/2020 1041   HDL 33 (L) 11/13/2020 1041   CHOLHDL 3.5 10/20/2019 1212   CHOLHDL 4 02/22/2019 1444   VLDL 53.8 (H) 02/22/2019 1444   LDLCALC 38 11/13/2020 1041   LDLDIRECT 48 10/20/2019 1212   LDLDIRECT 58.0 02/22/2019 1444    ECG personally reviewed by me today-  EKG 02/07/2021 atrial fibrillation incomplete  right bundle branch block 95 bpm- No acute changes  EKG 01/15/2021 atrial fibrillation with RVR incomplete right bundle branch block 120 bpm- No acute changes   Echocardiogram 12/17/20 IMPRESSIONS     1. Left ventricular ejection fraction, by estimation, is 30 to 35%. The  left ventricle has moderately decreased function. The left ventricle  demonstrates global hypokinesis. Left ventricular diastolic function could  not be evaluated.   2. Right ventricular systolic function is normal. The right ventricular  size is normal. There  is normal pulmonary artery systolic pressure.   3. Right atrial size was mildly dilated.   4. The mitral valve is normal in structure. No evidence of mitral valve  regurgitation.   5. The aortic valve is normal in structure. Aortic valve regurgitation is  not visualized.   Cardiac catheterization 12/19/2020 There is moderate left ventricular systolic dysfunction. LV end diastolic pressure is normal. The left ventricular ejection fraction is 35-45% by visual estimate. Hemodynamic findings consistent with mild pulmonary hypertension.   1. Normal coronary anatomy 2. Moderate LV dysfunction. EF estimated at 40% 3. Normal LV filling pressures 4. Mild pulmonary HTN. Mean PAP 24 mm Hg 5. Reduced cardiac output. Co ox 63%. Cardiac index 1.89   Plan: medical management. OK to start oral anticoagulation this pm.  Diagnostic Dominance: Co-dominant       Assessment & Plan   1.  Atrial fibrillation with RVR-heart rate today ***.  CHA2DS2-VASc score 2 for CHF and hypertension.  He reports continued compliance with apixaban.  Continues to be cardiac unaware.  Underwent DCCV 01/15/2021 with 3 shocks which was unsuccessful.   He wishes to defer A. fib clinic and future DCCV, again reviewed.   Continue  metoprolol 50 mg p.m. and continue 100 mg a.m. Continue apixaban, metoprolol Heart healthy low-sodium diet-salty 6 given Increase physical activity as  tolerated  Combined systolic and diastolic CHF-euvolemic today.  Weight stable.  Breathing stable at baseline.  Echocardiogram showed an EF of 30-35%.  It was felt that his  CHF was secondary to A. fib RVR or EtOH cardiomyopathy. Cardiac catheterization showed normal coronary arteries.  He was started on Entresto however, spironolactone was not started due to hyperkalemia.   Continue Entresto, metoprolol, Farxiga Heart healthy low-sodium diet-salty 6 given Increase physical activity as tolerated Daily weights   AKI- creatinine 1. 37 on 01/15/2021.  Baseline appears to be 1.4-1.50. Follows with PCP   Disposition: Follow-up with Dr. Debara Pickett in 6 months.   Jossie Ng. Diamonds Lippard NP-C    08/22/2021, 12:52 PM [place] [address] Office [phone] Fax [phone]  Notice: This dictation was prepared with Dragon dictation along with smaller phrase technology. Any transcriptional errors that result from this process are unintentional and may not be corrected upon review.  I spent 15 minutes examining this patient, reviewing medications, and using patient centered shared decision making involving her cardiac care.  Prior to her visit I spent greater than 20 minutes reviewing her past medical history,  medications, and prior cardiac tests.

## 2021-08-24 ENCOUNTER — Ambulatory Visit: Payer: Self-pay | Admitting: General Practice

## 2021-09-14 ENCOUNTER — Other Ambulatory Visit: Payer: Self-pay | Admitting: Family

## 2021-09-14 DIAGNOSIS — F411 Generalized anxiety disorder: Secondary | ICD-10-CM

## 2021-10-15 ENCOUNTER — Other Ambulatory Visit: Payer: Self-pay | Admitting: Family

## 2021-10-15 DIAGNOSIS — F411 Generalized anxiety disorder: Secondary | ICD-10-CM

## 2021-10-15 NOTE — Telephone Encounter (Signed)
Requesting: alprazolam 0.5mg   Contract: None UDS: None Last Visit: 06/01/2021 Next Visit: 11/30/2021 Last Refill: 09/14/2021 #90 and 0RF  Please Advise

## 2021-10-15 NOTE — Telephone Encounter (Signed)
Medication:  ALPRAZolam (XANAX) 0.5 MG tablet [[id number]]    Has the patient contacted their pharmacy? No. (If no, request that the patient contact the pharmacy for the refill.) (If yes, when and what did the pharmacy advise?)     Preferred Pharmacy (with phone number or street name):  [id number] - [place], [place] - [address] AT [facility]  [address], [place] [address]  Phone:  [phone]  Fax:  [phone]     Agent: Please be advised that RX refills may take up to 3 business days. We ask that you follow-up with your pharmacy.

## 2021-10-16 MED ORDER — ALPRAZOLAM 0.5 MG PO TABS: 0.5000 mg | ORAL_TABLET | Freq: Three times a day (TID) | ORAL | 0 refills | Status: DC

## 2021-10-18 NOTE — Progress Notes (Signed)
Cardiology Clinic Note   Patient Name: Daniel Butler Date of Encounter: 10/22/2021  Primary Care Provider:  Marrian Salvage, [place] Primary Cardiologist:  Pixie Casino, MD  Patient Profile    Daniel Butler 55 year old male presents to the clinic today for follow-up evaluation of his atrial fibrillation.    Past Medical History    Past Medical History:  Diagnosis Date   Brain aneurysm    Chicken pox    Depression    Gout    High cholesterol    Hypertension    Past Surgical History:  Procedure Laterality Date   CARDIOVERSION N/A 01/22/2021   Procedure: CARDIOVERSION;  Surgeon: Freada Bergeron, MD;  Location: [facility] ENDOSCOPY;  Service: Cardiovascular;  Laterality: N/A;   CRANIOTOMY Right 03/02/2015   Procedure: CRANIOTOMY INTRACRANIAL  ANEURYSM FOR CLIPPING;  Surgeon: Consuella Lose, MD;  Location: [place] NEURO ORS;  Service: Neurosurgery;  Laterality: Right;   IR GENERIC HISTORICAL  02/16/2016   IR ANGIO VERTEBRAL SEL VERTEBRAL BILAT MOD SED 02/16/2016 Consuella Lose, MD [facility]-INTERV RAD   IR GENERIC HISTORICAL  02/16/2016   IR ANGIO INTRA EXTRACRAN SEL INTERNAL CAROTID BILAT MOD SED 02/16/2016 Consuella Lose, MD [facility]   RADIOLOGY WITH ANESTHESIA N/A 03/02/2015   Procedure: RADIOLOGY WITH ANESTHESIA;  Surgeon: Consuella Lose, MD;  Location: [place];  Service: Radiology;  Laterality: N/A;   RIGHT/LEFT HEART CATH AND CORONARY ANGIOGRAPHY N/A 12/19/2020   Procedure: RIGHT/LEFT HEART CATH AND CORONARY ANGIOGRAPHY;  Surgeon: Martinique, Peter M, MD;  Location: [place] CV LAB;  Service: Cardiovascular;  Laterality: N/A;   WISDOM TOOTH EXTRACTION      Allergies  Allergies  Allergen Reactions   Zetia [Ezetimibe] Nausea And Vomiting    History of Present Illness    Daniel Butler has a PMH of hyperlipidemia, hypertension, brain aneurysm, tobacco use, EtOH use, anxiety, depression, acute combined systolic and diastolic CHF, and atrial fibrillation with  RVR.   He was admitted to the hospital on 12/15/2020 and discharged on 12/23/2020.  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  He underwent cardiac catheterization on 12/19/2020 which showed normal coronary arteries.  He was started on Entresto 24-26, spironolactone was not initiated due to hyperkalemia.   He presented to the clinic 01/15/2021 for follow-up evaluation stated he felt fairly well.  He noted that he previously had dizziness which he felt was slightly worse.  It had been chronic for the past 2 years.  He had been monitoring his diet and reported compliance with his apixaban medication.  He had been compliant with his no lifting over 30 pounds due to his brain aneurysm.  He tried to work part-time but was unable to complete work due to fatigue and lack of fitness.  He had been fairly sedentary at home.  His EKG  showed atrial fibrillation.  It was reviewed with DOD and he was scheduled for DCCV.  Blood pressure was 92/64.  He denied shortness of breath, chest pain, lower extremity edema, orthopnea and PND.  He was instructed to continue to abstain from EtOH.  He presented to the hospital 01/22/2021 for DCCV for cardioversion.  He underwent cardioversion x3 which was unsuccessful.  He presented to the clinic 02/07/2021 for follow-up evaluation stated he felt well.  We reviewed his unsuccessful cardioversion attempts.  He reported he had been compliant with his apixaban medication.  He denied bleeding issues.  He denied shortness of breath and activity intolerance.  His heart rate was 65.  We discussed options for treatment including referral to atrial fibrillation clinic, continued medical therapy, and repeat cardioversion.  He wished to continue medical therapy at the time.  He reported that he was in the process of filling out patient assistance paperwork for help with his medications.  We  previously discussed that it would be difficult to provide due to his previous [employer].  His medications were continued, we provided apixaban samples, and planned follow-up with Dr. Debara Pickett in 6 months.  He presents to the clinic today for follow-up evaluation and states he feels that he has less energy.  He reports that he has been fairly sedentary and does not exercise.  He continues to drink about 4 beers per day.  He reports that he also enjoys fruit juice.  He does note some dizziness with increased physical activity.  His blood pressure today is 102/62.  His pulse initially in the office is 56 bpm.  EKG shows atrial fibrillation with RVR 110 bpm.  With checking his pulse at home he notes it is routinely in the high 50s low 60s.  I have asked him to avoid triggers for atrial fibrillation, will repeat echocardiogram, have him increase his physical activity, and have him follow-up with Dr. Debara Pickett in 3-4 months.  Today he denies chest pain, shortness of breath, lower extremity edema, fatigue, palpitations, melena, hematuria, hemoptysis, diaphoresis, weakness, presyncope, syncope, orthopnea, and PND.   Home Medications    Prior to Admission medications   Medication Sig Start Date End Date Taking? Authorizing Provider  allopurinol (ZYLOPRIM) 100 MG tablet Take 1 tablet (100 mg total) by mouth daily. 12/11/20   Marrian Salvage, FNP  ALPRAZolam Duanne Moron) 0.5 MG tablet TAKE 1 TABLET BY MOUTH THREE TIMES A DAY AS NEEDED FOR ANXIETY Patient taking differently: Take 0.5 mg by mouth 3 (three) times daily as needed for anxiety. 01/12/21   Biagio Borg, MD  amiodarone (PACERONE) 200 MG tablet Take 1 tablet (200 mg total) by mouth 2 (two) times daily. Take 1 pill twice daily for 14 days and then one pill daily thereafter. 01/22/21   Freada Bergeron, MD  apixaban (ELIQUIS) 5 MG TABS tablet Take 1 tablet (5 mg total) by mouth 2 (two) times daily. 01/02/21   Marrian Salvage, FNP   dapagliflozin propanediol (FARXIGA) 10 MG TABS tablet Take 1 tablet (10 mg total) by mouth daily. 12/24/20   Thurnell Lose, MD  fenofibrate (TRICOR) 145 MG tablet Take 1 tablet (145 mg total) by mouth daily. 12/11/20   Marrian Salvage, FNP  folic acid (FOLVITE) 1 MG tablet Take 1 tablet (1 mg total) by mouth daily. 01/02/21   Marrian Salvage, FNP  icosapent Ethyl (VASCEPA) 1 g capsule Take 2 capsules (2 g total) by mouth 2 (two) times daily. 11/11/19   Hilty, Nadean Corwin, MD  metoprolol succinate (TOPROL-XL) 100 MG 24 hr tablet Take 1 tablet (100 mg total) by mouth daily. Take with or immediately following a meal. 01/02/21   Marrian Salvage, FNP  rosuvastatin (CRESTOR) 40 MG tablet TAKE 1 TABLET BY MOUTH ONCE DAILY. PATIENT NEEDS OV FOR FURTHER REFILLS Patient taking differently: Take 40 mg by mouth daily. 12/11/20   Hilty, Nadean Corwin, MD  sacubitril-valsartan (ENTRESTO) 24-26 MG Take 1 tablet by mouth 2 (two) times daily. 01/02/21   Marrian Salvage, FNP  thiamine 100 MG tablet Take  1 tablet (100 mg total) by mouth daily. 12/24/20   Thurnell Lose, MD    Family History    Family History  Adopted: Yes   is adopted.   Social History    Social History   Socioeconomic History   Marital status: Married    Spouse name: Egor Kershner   Number of children: 0   Years of education: 12   Highest education level: Some college, no degree  Occupational History   Occupation: unemployed    Comment: former Manufacturing systems engineer.   Tobacco Use   Smoking status: Every Day    Packs/day: 2.50    Years: 25.00    Pack years: 62.50    Types: Cigarettes   Smokeless tobacco: Never  Vaping Use   Vaping Use: Never used  Substance and Sexual Activity   Alcohol use: Yes    Alcohol/week: 70.0 standard drinks    Types: 35 Cans of beer, 35 Shots of liquor per week   Drug use: No   Sexual activity: Not Currently    Partners: Female    Birth control/protection: None  Other  Topics Concern   Not on file  Social History Narrative   Denies religious beliefs effecting health care.    Social Determinants of Health   Financial Resource Strain: Low Risk    Difficulty of Paying Living Expenses: Not hard at all  Food Insecurity: No Food Insecurity   Worried About Charity fundraiser in the Last Year: Never true   [place] in the Last Year: Never true  Transportation Needs: No Transportation Needs   Lack of Transportation (Medical): No   Lack of Transportation (Non-Medical): No  Physical Activity: Not on file  Stress: Not on file  Social Connections: Not on file  Intimate Partner Violence: Not on file     Review of Systems    General:  No chills, fever, night sweats or weight changes.  Cardiovascular:  No chest pain, dyspnea on exertion, edema, orthopnea, palpitations, paroxysmal nocturnal dyspnea. Dermatological: No rash, lesions/masses Respiratory: No cough, dyspnea Urologic: No hematuria, dysuria Abdominal:   No nausea, vomiting, diarrhea, bright red blood per rectum, melena, or hematemesis Neurologic:  No visual changes, wkns, changes in mental status. All other systems reviewed and are otherwise negative except as noted above.  Physical Exam    VS:  BP 102/62    Pulse (!) 56    Ht 6\' 1"  (1.854 m)    Wt 267 lb 3.2 oz (121.2 kg)    SpO2 97%    BMI 35.25 kg/m  , BMI Body mass index is 35.25 kg/m. GEN: Well nourished, well developed, in no acute distress. HEENT: normal. Neck: Supple, no JVD, carotid bruits, or masses. Cardiac: Irregularly irregular, no murmurs, rubs, or gallops. No clubbing, cyanosis, generalized bilateral lower extremity nonpitting edema.  Radials/DP/PT 2+ and equal bilaterally.  Respiratory:  Respirations regular and unlabored, clear to auscultation bilaterally. GI: Soft, nontender, nondistended, BS + x 4. MS: no deformity or atrophy. Skin: warm and dry, no rash. Neuro:  Strength and sensation are intact. Psych: Normal  affect.  Accessory Clinical Findings    Recent Labs: 12/15/2020: TSH 2.377 12/21/2020: B Natriuretic Peptide 411.2; Magnesium 2.0 01/02/2021: ALT 19; Hemoglobin 17.3; Platelets 370.0 01/15/2021: BUN 17; Creatinine, Ser 1.37; Potassium 5.0; Sodium 142   Recent Lipid Panel    Component Value Date/Time   CHOL 107 11/13/2020 1041   TRIG 233 (H) 11/13/2020 1041   HDL 33 (  L) 11/13/2020 1041   CHOLHDL 3.5 10/20/2019 1212   CHOLHDL 4 02/22/2019 1444   VLDL 53.8 (H) 02/22/2019 1444   LDLCALC 38 11/13/2020 1041   LDLDIRECT 48 10/20/2019 1212   LDLDIRECT 58.0 02/22/2019 1444    ECG personally reviewed by me today-atrial fibrillation with RVR 110 bpm  EKG 02/07/2021  atrial fibrillation incomplete right bundle branch block 95 bpm- No acute changes  EKG 01/15/2021 atrial fibrillation with RVR incomplete right bundle branch block 120 bpm- No acute changes   Echocardiogram 12/17/20 IMPRESSIONS     1. Left ventricular ejection fraction, by estimation, is 30 to 35%. The  left ventricle has moderately decreased function. The left ventricle  demonstrates global hypokinesis. Left ventricular diastolic function could  not be evaluated.   2. Right ventricular systolic function is normal. The right ventricular  size is normal. There is normal pulmonary artery systolic pressure.   3. Right atrial size was mildly dilated.   4. The mitral valve is normal in structure. No evidence of mitral valve  regurgitation.   5. The aortic valve is normal in structure. Aortic valve regurgitation is  not visualized.   Cardiac catheterization 12/19/2020 There is moderate left ventricular systolic dysfunction. LV end diastolic pressure is normal. The left ventricular ejection fraction is 35-45% by visual estimate. Hemodynamic findings consistent with mild pulmonary hypertension.   1. Normal coronary anatomy 2. Moderate LV dysfunction. EF estimated at 40% 3. Normal LV filling pressures 4. Mild pulmonary HTN. Mean  PAP 24 mm Hg 5. Reduced cardiac output. Co ox 63%. Cardiac index 1.89   Plan: medical management. OK to start oral anticoagulation this pm.  Diagnostic Dominance: Co-dominant       Assessment & Plan   1.  Atrial fibrillation with RVR- EKG today shows atrial fibrillation with RVR 110 bpm.  Pulse initially 56 bpm CHA2DS2-VASc score 2 for CHF and hypertension.  Compliant with apixaban.  Denies bleeding issues.  Cardiac unaware.  Underwent DCCV 01/15/2021 with 3 shocks and was unsuccessful.  Will order echocardiogram to reassess EF.  Reports increased fatigue and activity intolerance which appears to be related to A-fib versus deconditioning.  Reports drinking about 4 beers per day.  Alcohol cessation recommended. Start metoprolol 50 mg p.m. and continue 100 mg a.m. Continue apixaban, metoprolol Heart healthy low-sodium diet-salty 6 given Increase physical activity as tolerated Avoid triggers caffeine, chocolate, EtOH, dehydration etc.  Combined systolic and diastolic CHF-euvolemic today.    Breathing  at baseline.  Echocardiogram showed an EF of 30-35%.  CHF secondary to A. fib RVR or EtOH cardiomyopathy.  Cardiac catheterization showed normal coronary arteries.  He was started on Entresto however, spironolactone was not started due to hyperkalemia.  BMP continued to have potassium in the 5.0 range with a creatinine of 1.37. Continue Entresto, metoprolol, Farxiga Heart healthy low-sodium diet-salty 6 given Increase physical activity as tolerated Daily weights Repeat Echocardiogram-plan to consult electrophysiology if no improvement in LVEF  AKI- creatinine 1. 37 on 01/15/2021.  Baseline appears to be 1.4-1.50. Follows with PCP   Disposition: Follow-up with [name] in 3-4 months.   Thomasene Ripple. Idella Lamontagne NP-C    10/22/2021, 1:56 PM [facility] [address] Office [phone] Fax [phone]  Notice: This dictation was prepared with Dragon dictation  along with smaller phrase technology. Any transcriptional errors that result from this process are unintentional and may not be corrected upon review.  I spent 14 minutes examining this patient, reviewing medications,  and using patient centered shared decision making involving her cardiac care.  Prior to her visit I spent greater than 20 minutes reviewing her past medical history,  medications, and prior cardiac tests.

## 2021-10-22 ENCOUNTER — Other Ambulatory Visit: Payer: Self-pay

## 2021-10-22 ENCOUNTER — Ambulatory Visit (INDEPENDENT_AMBULATORY_CARE_PROVIDER_SITE_OTHER): Payer: Self-pay | Admitting: General Practice

## 2021-10-22 ENCOUNTER — Encounter: Payer: Self-pay | Admitting: General Practice

## 2021-10-22 VITALS — BP 102/62 | HR 56 | Ht 73.0 in | Wt 267.2 lb

## 2021-10-22 DIAGNOSIS — I5042 Chronic combined systolic (congestive) and diastolic (congestive) heart failure: Secondary | ICD-10-CM

## 2021-10-22 DIAGNOSIS — I4891 Unspecified atrial fibrillation: Secondary | ICD-10-CM

## 2021-10-22 DIAGNOSIS — N179 Acute kidney failure, unspecified: Secondary | ICD-10-CM

## 2021-10-22 NOTE — Patient Instructions (Signed)
Medication Instructions:  The current medical regimen is effective;  continue present plan and medications as directed. Please refer to the Current Medication list given to you today.   *If you need a refill on your cardiac medications before your next appointment, please call your pharmacy*  Lab Work:    NONE      Testing/Procedures:  Echocardiogram - Your physician has requested that you have an echocardiogram. Echocardiography is a painless test that uses sound waves to create images of your heart. It provides your doctor with information about the size and shape of your heart and how well your hearts chambers and valves are working. This procedure takes approximately one hour. There are no restrictions for this procedure. This will be performed at either our [facility] location - [address] -or- [facility] location [facility] 2nd floor.  Special Instructions PLEASE AVOID ALCOHOL  INCREASE HYDRATION 48-64 OUNCES DAILY  PLEASE INCREASE PHYSICAL ACTIVITY AS TOLERATED 5 MINUTES DAILY  Please try to avoid these triggers: Do not use any products that have nicotine or tobacco in them. These include cigarettes, e-cigarettes, and chewing tobacco. If you need help quitting, ask your doctor. Eat heart-healthy foods. Talk with your doctor about the right eating plan for you. Exercise regularly as told by your doctor. Stay hydrated Do not drink alcohol, Caffeine or chocolate. Lose weight if you are overweight. Do not use drugs, including cannabis   Follow-Up: Your next appointment:  3-4 month(s) In Person with Chrystie Nose, MD ONLY.  Please call IN 1 MONTH TO SCHEDULE AN APPOINTMENT.  At [facility], you and your health needs are our priority.  As part of our continuing mission to provide you with exceptional heart care, we have created designated Provider Care Teams.  These Care Teams include your primary Cardiologist (physician) and Advanced Practice  Providers (APPs -  Physician Assistants and Nurse Practitioners) who all work together to provide you with the care you need, when you need it.

## 2021-10-23 ENCOUNTER — Telehealth: Payer: Self-pay | Admitting: Internal Medicine

## 2021-10-23 DIAGNOSIS — E781 Pure hyperglyceridemia: Secondary | ICD-10-CM

## 2021-10-23 MED ORDER — ICOSAPENT ETHYL 1 G PO CAPS: 2.0000 g | ORAL_CAPSULE | Freq: Two times a day (BID) | ORAL | 2 refills | Status: DC

## 2021-10-23 NOTE — Telephone Encounter (Signed)
°*  STAT* If patient is at the pharmacy, call can be transferred to refill team.   1. Which medications need to be refilled? (please list name of each medication and dose if known) icosapent Ethyl (VASCEPA) 1 g capsule  2. Which pharmacy/location (including street and city if local pharmacy) is medication to be sent to? [facility] - [place], [place] - [place]  3. Do they need a 30 day or 90 day supply? 90 day

## 2021-10-23 NOTE — Addendum Note (Signed)
Addended by: Clementeen Graham E on: 10/23/2021 02:36 PM   Modules accepted: Orders

## 2021-11-10 ENCOUNTER — Other Ambulatory Visit: Payer: Self-pay | Admitting: Family

## 2021-11-10 DIAGNOSIS — F411 Generalized anxiety disorder: Secondary | ICD-10-CM

## 2021-11-12 ENCOUNTER — Telehealth: Payer: Self-pay | Admitting: Internal Medicine

## 2021-11-12 ENCOUNTER — Telehealth: Payer: Self-pay | Admitting: Family

## 2021-11-12 ENCOUNTER — Ambulatory Visit (HOSPITAL_COMMUNITY): Payer: Self-pay

## 2021-11-12 DIAGNOSIS — E781 Pure hyperglyceridemia: Secondary | ICD-10-CM

## 2021-11-12 NOTE — Telephone Encounter (Signed)
Pt c/o medication issue: ? ?1. Name of Medication: apixaban (ELIQUIS) 5 MG TABS tablet ? ?2. How are you currently taking this medication (dosage and times per day)? As prescribed  ? ?3. Are you having a reaction (difficulty breathing--STAT)? No  ? ?4. What is your medication issue? Patient's wife is calling stating the patient is needing to be resubmitted for the Eliquis program. Please advise.   ?

## 2021-11-12 NOTE — Telephone Encounter (Signed)
Pt stated pharmacy sent over two requests, and is hoping to get rx tomorrow. Please advise.  ? ?Medication: ALPRAZolam (XANAX) 0.5 MG tablet ? ?Has the patient contacted their pharmacy? Yes.   ? ?[facility] [address] [place] - [address] AT [facility]  ?[address] [place] [address]  ?Phone:  [phone]  Fax:  [phone]  ? ?

## 2021-11-12 NOTE — Telephone Encounter (Signed)
LM2CB-Will need to fill out pt assistance form and provide proof of income and pharmacy cost of medcations spent. Will put the forms at the front desk for pt/wife to fill out ?

## 2021-11-12 NOTE — Telephone Encounter (Signed)
Wife is calling back in regards to this due to the patient running out at the end of this month.  ?

## 2021-11-12 NOTE — Telephone Encounter (Signed)
Requesting: xanax 0.5 MG ?Contract: none   ?UDS: none ?Last Visit: 06/01/21 ?Next Visit: 11/30/21 ?Last Refill: 10/16/21, #90, 0 refills ? ?Please Advise  ?

## 2021-11-15 ENCOUNTER — Other Ambulatory Visit: Payer: Self-pay

## 2021-11-15 MED ORDER — ICOSAPENT ETHYL 1 G PO CAPS: 2.0000 g | ORAL_CAPSULE | Freq: Two times a day (BID) | ORAL | 2 refills | Status: DC

## 2021-11-15 NOTE — Telephone Encounter (Signed)
Patient's wife is calling back stating that the PA for this medication has still not been received.  ?

## 2021-11-15 NOTE — Telephone Encounter (Signed)
Called pt's wife back to update her. She is made aware they will reach out to them when they have any answer back about the medication being refilled. She verbalized understanding.  ?

## 2021-11-15 NOTE — Telephone Encounter (Addendum)
Called pt's wife. She states they called the pharmacy and they said they are still waiting for a PA for Vascepa. She states the medications are sent to [facility] and in turn are filled by [employer] . Will call the pharmacy to see what is going on with this medication. Will call them back after information is gathered.  ?

## 2021-11-15 NOTE — Telephone Encounter (Signed)
Contacted patient back, advised that the paperwork for the Eliquis patient assistance was placed up front- and if they needed them to come get them and have them filled out with information, bring them back and we could fill out our portion.  ? ?Left call back number if needed.  ? ?

## 2021-11-15 NOTE — Telephone Encounter (Signed)
Patient returning call.

## 2021-11-15 NOTE — Telephone Encounter (Signed)
Spoke with patient wife- she is aware to come get the paperwork here at our office.  ?Patient verbalized understanding. ? ?She also requested we send RX for Vascepa- made aware we sent this on 10/23/2021 was confirmed by pharmacy.  ? ?

## 2021-11-15 NOTE — Telephone Encounter (Signed)
Called [facility]. Was told the issue was not a PA, but the prescription they had ran out. I new prescription was sent the [facility] the preferred pharmacy. Will call pt's wife back with the update.  ?

## 2021-11-15 NOTE — Addendum Note (Signed)
Addended by: Orvan July on: 11/15/2021 04:08 PM ? ? Modules accepted: Orders ? ?

## 2021-11-19 ENCOUNTER — Other Ambulatory Visit (HOSPITAL_COMMUNITY): Payer: Self-pay

## 2021-11-30 ENCOUNTER — Ambulatory Visit: Payer: Self-pay | Admitting: Family

## 2021-12-03 ENCOUNTER — Other Ambulatory Visit: Payer: Self-pay

## 2021-12-03 ENCOUNTER — Ambulatory Visit (HOSPITAL_COMMUNITY): Payer: Self-pay | Attending: Cardiology

## 2021-12-03 DIAGNOSIS — I5042 Chronic combined systolic (congestive) and diastolic (congestive) heart failure: Secondary | ICD-10-CM | POA: Insufficient documentation

## 2021-12-03 DIAGNOSIS — I4891 Unspecified atrial fibrillation: Secondary | ICD-10-CM | POA: Insufficient documentation

## 2021-12-03 DIAGNOSIS — N179 Acute kidney failure, unspecified: Secondary | ICD-10-CM | POA: Insufficient documentation

## 2021-12-03 LAB — ECHOCARDIOGRAM COMPLETE: S' Lateral: 3.7 cm

## 2021-12-03 MED ORDER — PERFLUTREN LIPID MICROSPHERE
1.0000 mL | INTRAVENOUS | Status: AC | PRN
  Administered 2021-12-03: 2 mL via INTRAVENOUS

## 2021-12-04 ENCOUNTER — Other Ambulatory Visit: Payer: Self-pay

## 2021-12-04 DIAGNOSIS — I5042 Chronic combined systolic (congestive) and diastolic (congestive) heart failure: Secondary | ICD-10-CM

## 2021-12-04 DIAGNOSIS — I4891 Unspecified atrial fibrillation: Secondary | ICD-10-CM

## 2021-12-04 DIAGNOSIS — N179 Acute kidney failure, unspecified: Secondary | ICD-10-CM

## 2021-12-04 DIAGNOSIS — I5041 Acute combined systolic (congestive) and diastolic (congestive) heart failure: Secondary | ICD-10-CM

## 2021-12-12 ENCOUNTER — Other Ambulatory Visit: Payer: Self-pay | Admitting: Family

## 2021-12-12 DIAGNOSIS — F411 Generalized anxiety disorder: Secondary | ICD-10-CM

## 2021-12-12 NOTE — Telephone Encounter (Signed)
Requesting: alprazolam 0.5mg   ?Contract: None ?UDS: None ?Last Visit:06/01/21 ?Next Visit:12/28/21 ?Last Refill: 11/13/2021 #90 and 0RF ?Pt sig: 1 tab tid prn ? ?Please Advise ? ?

## 2021-12-13 ENCOUNTER — Telehealth: Payer: Self-pay | Admitting: Internal Medicine

## 2021-12-13 NOTE — Telephone Encounter (Signed)
Faxed application for Eliquis patient assistance to [employer] at [phone] ?

## 2021-12-21 ENCOUNTER — Other Ambulatory Visit: Payer: Self-pay | Admitting: Internal Medicine

## 2021-12-24 NOTE — Telephone Encounter (Addendum)
Patient is approved for assistance for Eliquis from [place] until 02/09/2022 -- 02/12/2023 ?

## 2021-12-28 ENCOUNTER — Ambulatory Visit: Payer: Self-pay | Admitting: Family

## 2022-01-04 ENCOUNTER — Ambulatory Visit (INDEPENDENT_AMBULATORY_CARE_PROVIDER_SITE_OTHER): Payer: Self-pay | Admitting: Family

## 2022-01-04 ENCOUNTER — Encounter: Payer: Self-pay | Admitting: Family

## 2022-01-04 VITALS — BP 90/58 | HR 84 | Temp 97.5°F | Ht 72.0 in | Wt 261.6 lb

## 2022-01-04 DIAGNOSIS — R42 Dizziness and giddiness: Secondary | ICD-10-CM

## 2022-01-04 DIAGNOSIS — M1A9XX0 Chronic gout, unspecified, without tophus (tophi): Secondary | ICD-10-CM

## 2022-01-04 DIAGNOSIS — I4891 Unspecified atrial fibrillation: Secondary | ICD-10-CM

## 2022-01-04 DIAGNOSIS — F411 Generalized anxiety disorder: Secondary | ICD-10-CM

## 2022-01-04 DIAGNOSIS — I5041 Acute combined systolic (congestive) and diastolic (congestive) heart failure: Secondary | ICD-10-CM

## 2022-01-04 DIAGNOSIS — E785 Hyperlipidemia, unspecified: Secondary | ICD-10-CM

## 2022-01-04 NOTE — Progress Notes (Signed)
?RAEDYN WENKE is a 56 y.o. male with the following history as recorded in EpicCare:  ?Patient Active Problem List  ? Diagnosis Date Noted  ? Acute combined systolic and diastolic heart failure ([place])   ? Alcohol abuse with intoxication ([place]) 12/15/2020  ? Transient hypotension 12/15/2020  ? Acute metabolic encephalopathy 25/00/3704  ? AKI (acute kidney injury) ([place]) 12/15/2020  ? History of subarachnoid hemorrhage 12/15/2020  ? Hypoalbuminemia 12/15/2020  ? Atrial fibrillation ([place]) 12/15/2020  ? Bilateral lower extremity edema 12/15/2020  ? Fall at home, initial encounter 12/15/2020  ? Alcohol abuse 06/02/2018  ? Tobacco abuse 06/02/2018  ? Hypertriglyceridemia 01/19/2018  ? Essential hypertension 10/02/2017  ? Sciatica of right side 04/11/2017  ? Sleep disturbance 05/19/2015  ? Anxiety state 05/19/2015  ? Generalized headaches 04/18/2015  ? Gout 04/18/2015  ? Encounter for orogastric (OG) tube placement   ? Subarachnoid hemorrhage ([place]) 03/02/2015  ? Acute respiratory failure with hypoxia ([place])   ? SAH (subarachnoid hemorrhage) ([place])   ? Subarachnoid bleed ([place])   ? Malignant hypertension   ?  ?Current Outpatient Medications  ?Medication Sig Dispense Refill  ? allopurinol (ZYLOPRIM) 100 MG tablet Take 1 tablet (100 mg total) by mouth daily. 90 tablet 3  ? ALPRAZolam (XANAX) 0.5 MG tablet TAKE 1 TABLET BY MOUTH THREE TIMES A DAY 90 tablet 0  ? apixaban (ELIQUIS) 5 MG TABS tablet Take 1 tablet (5 mg total) by mouth 2 (two) times daily. 60 tablet 3  ? dapagliflozin propanediol (FARXIGA) 10 MG TABS tablet Take 1 tablet (10 mg total) by mouth daily. 30 tablet 3  ? fenofibrate (TRICOR) 145 MG tablet Take 1 tablet (145 mg total) by mouth daily. 90 tablet 3  ? folic acid (FOLVITE) 1 MG tablet Take 1 tablet (1 mg total) by mouth daily. 90 tablet 3  ? icosapent Ethyl (VASCEPA) 1 g capsule Take 2 capsules (2 g total) by mouth 2 (two) times daily. 360 capsule 2  ? metoprolol succinate (TOPROL-XL) 100 MG 24 hr tablet  Take 141m (1 tablet) in the morning and 532m(one half tablet) in the afternoon. 135 tablet 3  ? rosuvastatin (CRESTOR) 40 MG tablet TAKE 1 TABLET BY MOUTH ONCE DAILY . APPOINTMENT REQUIRED FOR FUTURE REFILLS 90 tablet 0  ? sacubitril-valsartan (ENTRESTO) 24-26 MG Take 1 tablet by mouth 2 (two) times daily. 60 tablet 3  ? thiamine (VITAMIN B-1) 100 MG tablet Take 100 mg by mouth daily.    ? thiamine 100 MG tablet Take 1 tablet (100 mg total) by mouth daily. 30 tablet 0  ? ?No current facility-administered medications for this visit.  ?  ?Allergies: Zetia [ezetimibe]  ?Past Medical History:  ?Diagnosis Date  ? Brain aneurysm   ? Chicken pox   ? Depression   ? Gout   ? High cholesterol   ? Hypertension   ?  ?Past Surgical History:  ?Procedure Laterality Date  ? CARDIOVERSION N/A 01/22/2021  ? Procedure: CARDIOVERSION;  Surgeon: PeFreada BergeronMD;  [facility]: [facility];  Laterality: N/A;  ? CRANIOTOMY Right 03/02/2015  ? Procedure: CRANIOTOMY INTRACRANIAL  ANEURYSM FOR CLIPPING;  Surgeon: NeConsuella LoseMD;  [facility];  Service: Neurosurgery;  Laterality: Right;  ? IR GENERIC HISTORICAL  02/16/2016  ? IR ANGIO VERTEBRAL SEL VERTEBRAL BILAT MOD SED 02/16/2016 NeConsuella LoseMD [facility]  ? IR GENERIC HISTORICAL  02/16/2016  ? IR ANGIO INTRA EXTRACRAN SEL INTERNAL CAROTID BILAT MOD SED 02/16/2016 NeNena Polio  Kathyrn Sheriff, MD [facility]  ? RADIOLOGY WITH ANESTHESIA N/A 03/02/2015  ? Procedure: RADIOLOGY WITH ANESTHESIA;  Surgeon: Consuella Lose, MD;  Location: [facility];  Service: Radiology;  Laterality: N/A;  ? RIGHT/LEFT HEART CATH AND CORONARY ANGIOGRAPHY N/A 12/19/2020  ? Procedure: RIGHT/LEFT HEART CATH AND CORONARY ANGIOGRAPHY;  Surgeon: Martinique, Peter M, MD;  Location: [facility];  Service: Cardiovascular;  Laterality: N/A;  ? WISDOM TOOTH EXTRACTION    ?  ?Family History  ?Adopted: Yes  ?  ?Social History  ? ?Tobacco Use  ? Smoking status: Every Day  ?  Packs/day:  2.50  ?  Years: 25.00  ?  Pack years: 62.50  ?  Types: Cigarettes  ? Smokeless tobacco: Never  ?Substance Use Topics  ? Alcohol use: Yes  ?  Alcohol/week: 70.0 standard drinks  ?  Types: 35 Cans of beer, 35 Shots of liquor per week  ?  ?Subjective:  ? ?Accompanied by his wife today; discussed chronic/ recurrent dizziness; blood pressure is very low today at 90/58- patient is on combination of Entresto, Farxiga and Metoprolol; does feel that he has to move slowly when changing positions in order to "get my balance."  ?Agreeable to basic yearly labs; does not want to check uric acid due to cost- no concerns for gout flare; stable on Allopurinol; ? ? ? ?Objective:  ?Vitals:  ? 01/04/22 1338  ?BP: (!) 90/58  ?Pulse: 84  ?Temp: (!) 97.5 ?F (36.4 ?C)  ?TempSrc: Oral  ?SpO2: 96%  ?Weight: 261 lb 9.6 oz (118.7 kg)  ?Height: 6' (1.829 m)  ?  ?General: Well developed, well nourished, in no acute distress  ?Skin : Warm and dry.  ?Head: Normocephalic and atraumatic  ?Eyes: Sclera and conjunctiva clear; pupils round and reactive to light; extraocular movements intact  ?Ears: External normal; canals clear; tympanic membranes normal  ?Oropharynx: Pink, supple. No suspicious lesions  ?Neck: Supple without thyromegaly, adenopathy  ?Lungs: Respirations unlabored; clear to auscultation bilaterally without wheeze, rales, rhonchi  ?CVS exam: irregularly irregular rhythm with rate 84  ?Neurologic: Alert and oriented; speech intact; face symmetrical; uses rolling walker; ? ?Assessment:  ?1. Dizziness   ?2. Atrial fibrillation, unspecified type ([place])   ?3. Acute combined systolic and diastolic heart failure ([place])   ?4. Chronic gout without tophus, unspecified cause, unspecified site   ?5. Hyperlipidemia, unspecified hyperlipidemia type   ?6. Anxiety state   ?  ?Plan:  ?Concern for hypotension- have reached out to cardiology to ask for their input on adjusting medication; ? If dosages of Entresto or [place] could be lowered; check CBC, CMP  today; ?Hold checking uric acid per patient request; ?Will continue to refill Xanax monthly;  ? ?No follow-ups on file.  ?Orders Placed This Encounter  ?Procedures  ? CBC with Differential/Platelet  ? Comp Met (CMET)  ?  ?Requested Prescriptions  ? ? No prescriptions requested or ordered in this encounter  ?  ? ?

## 2022-01-05 LAB — CBC WITH DIFFERENTIAL/PLATELET
Absolute Monocytes: 637 cells/uL (ref 200–950)
Basophils Absolute: 88 cells/uL (ref 0–200)
Basophils Relative: 0.9 %
Eosinophils Absolute: 529 cells/uL — ABNORMAL HIGH (ref 15–500)
Eosinophils Relative: 5.4 %
HCT: 44.6 % (ref 38.5–50.0)
Hemoglobin: 15.1 g/dL (ref 13.2–17.1)
Lymphs Abs: 2813 cells/uL (ref 850–3900)
MCH: 33.8 pg — ABNORMAL HIGH (ref 27.0–33.0)
MCHC: 33.9 g/dL (ref 32.0–36.0)
MCV: 99.8 fL (ref 80.0–100.0)
MPV: 10.4 fL (ref 7.5–12.5)
Monocytes Relative: 6.5 %
Neutro Abs: 5733 cells/uL (ref 1500–7800)
Neutrophils Relative %: 58.5 %
Platelets: 264 10*3/uL (ref 140–400)
RBC: 4.47 10*6/uL (ref 4.20–5.80)
RDW: 13.4 % (ref 11.0–15.0)
Total Lymphocyte: 28.7 %
WBC: 9.8 10*3/uL (ref 3.8–10.8)

## 2022-01-05 LAB — COMPREHENSIVE METABOLIC PANEL
AG Ratio: 1.5 (calc) (ref 1.0–2.5)
ALT: 82 U/L — ABNORMAL HIGH (ref 9–46)
AST: 109 U/L — ABNORMAL HIGH (ref 10–35)
Albumin: 3.9 g/dL (ref 3.6–5.1)
Alkaline phosphatase (APISO): 38 U/L (ref 35–144)
BUN/Creatinine Ratio: 15 (calc) (ref 6–22)
BUN: 31 mg/dL — ABNORMAL HIGH (ref 7–25)
CO2: 16 mmol/L — ABNORMAL LOW (ref 20–32)
Calcium: 9.2 mg/dL (ref 8.6–10.3)
Chloride: 105 mmol/L (ref 98–110)
Creat: 2.08 mg/dL — ABNORMAL HIGH (ref 0.70–1.30)
Globulin: 2.6 g/dL (calc) (ref 1.9–3.7)
Glucose, Bld: 101 mg/dL — ABNORMAL HIGH (ref 65–99)
Potassium: 5 mmol/L (ref 3.5–5.3)
Sodium: 136 mmol/L (ref 135–146)
Total Bilirubin: 0.8 mg/dL (ref 0.2–1.2)
Total Protein: 6.5 g/dL (ref 6.1–8.1)

## 2022-01-07 ENCOUNTER — Other Ambulatory Visit: Payer: Self-pay | Admitting: Family

## 2022-01-07 DIAGNOSIS — R7989 Other specified abnormal findings of blood chemistry: Secondary | ICD-10-CM

## 2022-01-11 ENCOUNTER — Other Ambulatory Visit: Payer: Self-pay

## 2022-01-11 ENCOUNTER — Other Ambulatory Visit: Payer: Self-pay | Admitting: Family

## 2022-01-11 DIAGNOSIS — F411 Generalized anxiety disorder: Secondary | ICD-10-CM

## 2022-01-11 NOTE — Progress Notes (Signed)
? ?Cardiology Clinic Note  ? ?Patient Name: Daniel Butler ?Date of Encounter: 01/14/2022 ? ?Primary Care Provider:  Marrian Salvage, [place] ?Primary Cardiologist:  Pixie Casino, MD ? ?Patient Profile  ?  ?Daniel Butler 56 year old male presents to the clinic today for follow-up evaluation of his atrial fibrillation.   ? ?Past Medical History  ?  ?Past Medical History:  ?Diagnosis Date  ? Brain aneurysm   ? Chicken pox   ? Depression   ? Gout   ? High cholesterol   ? Hypertension   ? ?Past Surgical History:  ?Procedure Laterality Date  ? CARDIOVERSION N/A 01/22/2021  ? Procedure: CARDIOVERSION;  Surgeon: Freada Bergeron, MD;  Location: [facility];  Service: Cardiovascular;  Laterality: N/A;  ? CRANIOTOMY Right 03/02/2015  ? Procedure: CRANIOTOMY INTRACRANIAL  ANEURYSM FOR CLIPPING;  Surgeon: Consuella Lose, MD;  Location: [facility];  Service: Neurosurgery;  Laterality: Right;  ? IR GENERIC HISTORICAL  02/16/2016  ? IR ANGIO VERTEBRAL SEL VERTEBRAL BILAT MOD SED 02/16/2016 Consuella Lose, MD [facility]  ? IR GENERIC HISTORICAL  02/16/2016  ? IR ANGIO INTRA EXTRACRAN SEL INTERNAL CAROTID BILAT MOD SED 02/16/2016 Consuella Lose, MD [facility]  ? RADIOLOGY WITH ANESTHESIA N/A 03/02/2015  ? Procedure: RADIOLOGY WITH ANESTHESIA;  Surgeon: Consuella Lose, MD;  Location: [facility];  Service: Radiology;  Laterality: N/A;  ? RIGHT/LEFT HEART CATH AND CORONARY ANGIOGRAPHY N/A 12/19/2020  ? Procedure: RIGHT/LEFT HEART CATH AND CORONARY ANGIOGRAPHY;  Surgeon: Martinique, Peter M, MD;  Location: [facility];  Service: Cardiovascular;  Laterality: N/A;  ? WISDOM TOOTH EXTRACTION    ? ? ?Allergies ? ?Allergies  ?Allergen Reactions  ? Zetia [Ezetimibe] Nausea And Vomiting  ? ? ?History of Present Illness  ?  ?Daniel Butler has a PMH of hyperlipidemia, hypertension, brain aneurysm, tobacco use, EtOH use, anxiety, depression, acute combined systolic and diastolic CHF, and atrial fibrillation with RVR. ?   ?He was admitted to the hospital on 12/15/2020 and discharged on 12/23/2020.  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  He underwent cardiac catheterization on 12/19/2020 which showed normal coronary arteries.  He was started on Entresto 24-26, spironolactone was not initiated due to hyperkalemia. ?  ?He presented to the clinic 01/15/2021 for follow-up evaluation stated he felt fairly well.  He noted that he previously had dizziness which he felt was slightly worse.  It had been chronic for the past 2 years.  He had been monitoring his diet and reported compliance with his apixaban medication.  He had been compliant with his no lifting over 30 pounds due to his brain aneurysm.  He tried to work part-time but was unable to complete work due to fatigue and lack of fitness.  He had been fairly sedentary at home.  His EKG  showed atrial fibrillation.  It was reviewed with DOD and he was scheduled for DCCV.  Blood pressure was 92/64.  He denied shortness of breath, chest pain, lower extremity edema, orthopnea and PND.  He was instructed to continue to abstain from EtOH. ? ?He presented to the hospital 01/22/2021 for DCCV for cardioversion.  He underwent cardioversion x3 which was unsuccessful. ? ?He presented to the clinic 02/07/2021 for follow-up evaluation stated he felt well.  We reviewed his unsuccessful cardioversion attempts.  He reported he had been compliant with his apixaban medication.  He denied bleeding issues.  He denied shortness of breath and activity intolerance.  His heart rate was 65.  We discussed options for treatment including referral to atrial fibrillation clinic, continued medical therapy, and repeat cardioversion.  He wished to continue medical therapy at the time.  He reported that he was in the process of filling out patient assistance paperwork for help with his medications.  We  previously discussed that it would be difficult to provide due to his previous [employer].  His medications were continued, we provided apixaban samples, and planned follow-up with Dr. Debara Pickett in 6 months. ? ?He presented to the clinic 10/22/2021 for follow-up evaluation and stated he felt that he had less energy.  He reported that he had been fairly sedentary and did not exercise.  He continued to drink about 4 beers per day.  He reported that he also enjoyed fruit juice.  He did note some dizziness with increased physical activity.  His blood pressure was 102/62.  His pulse was 56 bpm.  EKG showed atrial fibrillation with RVR 110 bpm.  With checking his pulse at home he noted it was routinely in the high 50s low 60s.  I  asked him to avoid triggers for atrial fibrillation, planned repeat echocardiogram, asked him to increase his physical activity, and planned follow-up with Dr. Debara Pickett in 3-4 months.  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 ? ?He presents to the clinic today for follow-up evaluation states he has noticed increased dizziness since being on [place].  He presented to his PCP which discontinued [place].  She is monitoring his kidney function.  We will stop Entresto and start valsartan 20 mg daily.  We will not restart Farxiga at this time.  We reviewed the importance of continuing to take Eliquis.  He and his wife expressed understanding.  We again reviewed the importance of stopping EtOH.  I will have him increase his physical activity as tolerated, plan BMP in 2 weeks, and plan follow-up for 1 to 2 months with Dr. Debara Pickett. ? ?Today he denies chest pain, increased shortness of breath, lower extremity edema, fatigue, palpitations, melena, hematuria, hemoptysis, diaphoresis, weakness, presyncope, syncope, orthopnea, and PND. ? ? ?Home Medications  ?  ?Prior to  Admission medications   ?Medication Sig Start Date End Date Taking? Authorizing Provider  ?allopurinol (ZYLOPRIM) 100 MG tablet Take 1 tablet (100 mg total) by mouth daily. 12/11/20   Marrian Salvage, [place]  ?ALPRAZolam (XANAX) 0.5 MG tablet TAKE 1 TABLET BY MOUTH THREE TIMES A DAY AS NEEDED FOR ANXIETY ?Patient taking differently: Take 0.5 mg by mouth 3 (three) times daily as needed for anxiety. 01/12/21   Biagio Borg, MD  ?amiodarone (PACERONE) 200 MG tablet Take 1 tablet (200 mg total) by mouth 2 (two) times daily. Take 1 pill twice daily for 14 days and then one pill daily thereafter. 01/22/21   Freada Bergeron, MD  ?apixaban (ELIQUIS) 5 MG TABS tablet Take 1 tablet (5 mg total) by mouth 2 (two) times daily. 01/02/21   Marrian Salvage, FNP  ?dapagliflozin propanediol (FARXIGA) 10 MG TABS tablet Take 1 tablet (10 mg total) by mouth daily. 12/24/20   Thurnell Lose, MD  ?fenofibrate (TRICOR) 145 MG tablet Take 1 tablet (145 mg total) by mouth daily. 12/11/20   Marrian Salvage, [place]  ?folic acid (FOLVITE) 1 MG tablet Take 1 tablet (1 mg total)  by mouth daily. 01/02/21   Marrian Salvage, [place]  ?icosapent Ethyl (VASCEPA) 1 g capsule Take 2 capsules (2 g total) by mouth 2 (two) times daily. 11/11/19   Hilty, Nadean Corwin, MD  ?metoprolol succinate (TOPROL-XL) 100 MG 24 hr tablet Take 1 tablet (100 mg total) by mouth daily. Take with or immediately following a meal. 01/02/21   Marrian Salvage, [place]  ?rosuvastatin (CRESTOR) 40 MG tablet TAKE 1 TABLET BY MOUTH ONCE DAILY. PATIENT NEEDS OV FOR FURTHER REFILLS ?Patient taking differently: Take 40 mg by mouth daily. 12/11/20   Hilty, Nadean Corwin, MD  ?sacubitril-valsartan (ENTRESTO) 24-26 MG Take 1 tablet by mouth 2 (two) times daily. 01/02/21   Marrian Salvage, [place]  ?thiamine 100 MG tablet Take 1 tablet (100 mg total) by mouth daily. 12/24/20   Thurnell Lose, MD  ? ? ?Family History  ?  ?Family History  ?Adopted: Yes  ? ?is adopted.  ? ?Social  History  ?  ?Social History  ? ?Socioeconomic History  ? Marital status: Married  ?  Spouse name: Bhargav Goyette  ? Number of children: 0  ? Years of education: 75  ? Highest education level: Some college, no d

## 2022-01-14 ENCOUNTER — Ambulatory Visit (INDEPENDENT_AMBULATORY_CARE_PROVIDER_SITE_OTHER): Payer: Self-pay | Admitting: General Practice

## 2022-01-14 ENCOUNTER — Encounter: Payer: Self-pay | Admitting: General Practice

## 2022-01-14 ENCOUNTER — Other Ambulatory Visit (INDEPENDENT_AMBULATORY_CARE_PROVIDER_SITE_OTHER): Payer: Self-pay

## 2022-01-14 VITALS — BP 96/50 | HR 55 | Ht 73.0 in | Wt 259.0 lb

## 2022-01-14 DIAGNOSIS — I5042 Chronic combined systolic (congestive) and diastolic (congestive) heart failure: Secondary | ICD-10-CM

## 2022-01-14 DIAGNOSIS — R7989 Other specified abnormal findings of blood chemistry: Secondary | ICD-10-CM

## 2022-01-14 DIAGNOSIS — N179 Acute kidney failure, unspecified: Secondary | ICD-10-CM

## 2022-01-14 DIAGNOSIS — I4891 Unspecified atrial fibrillation: Secondary | ICD-10-CM

## 2022-01-14 MED ORDER — ROSUVASTATIN CALCIUM 40 MG PO TABS: ORAL_TABLET | ORAL | 3 refills | Status: DC

## 2022-01-14 MED ORDER — VALSARTAN 40 MG PO TABS: 20.0000 mg | ORAL_TABLET | Freq: Every day | ORAL | 3 refills | Status: DC

## 2022-01-14 MED ORDER — METOPROLOL SUCCINATE ER 50 MG PO TB24: 50.0000 mg | ORAL_TABLET | Freq: Two times a day (BID) | ORAL | 3 refills | Status: DC

## 2022-01-14 NOTE — Patient Instructions (Addendum)
Medication Instructions:  ?CHANGE  Metoprolol to 50mg  Twice a day  ?DISCONTINUE Entresto ?START Valsartan 20mg  Take 1 tablet once a day  ?*If you need a refill on your cardiac medications before your next appointment, please call your pharmacy* ? ? ?Lab Work: ?Your physician recommends that you return for lab work in: 2 weeks BMET (Jan 28, 2022) ?If you have labs (blood work) drawn today and your tests are completely normal, you will receive your results only by: ?MyChart Message (if you have MyChart) OR ?A paper copy in the mail ?If you have any lab test that is abnormal or we need to change your treatment, we will call you to review the results. ? ? ?Testing/Procedures: ?None Ordered ? ? ?Follow-Up: ?At [facility], you and your health needs are our priority.  As part of our continuing mission to provide you with exceptional heart care, we have created designated Provider Care Teams.  These Care Teams include your primary Cardiologist (physician) and Advanced Practice Providers (APPs -  Physician Assistants and Nurse Practitioners) who all work together to provide you with the care you need, when you need it. ? ?We recommend signing up for the patient portal called "MyChart".  Sign up information is provided on this After Visit Summary.  MyChart is used to connect with patients for Virtual Visits (Telemedicine).  Patients are able to view lab/test results, encounter notes, upcoming appointments, etc.  Non-urgent messages can be sent to your provider as well.   ?To learn more about what you can do with MyChart, go to Jan 30, 2022.   ? ?Your next appointment:   ?FIRST AVAILABLE     ? ?The format for your next appointment:   ?In Person ? ?Provider:   ?[facility] ONLY ? ? ?Other Instructions ?CUT BACK ON BEER ?INCREASE PHYSICAL ACTIVITY AS TOLERATED ? ?Important Information About Sugar ? ? ? ? ?  ?

## 2022-01-15 ENCOUNTER — Other Ambulatory Visit: Payer: Self-pay | Admitting: Family

## 2022-01-15 DIAGNOSIS — R7989 Other specified abnormal findings of blood chemistry: Secondary | ICD-10-CM

## 2022-01-15 LAB — COMPREHENSIVE METABOLIC PANEL
ALT: 84 U/L — ABNORMAL HIGH (ref 0–53)
AST: 119 U/L — ABNORMAL HIGH (ref 0–37)
Albumin: 3.9 g/dL (ref 3.5–5.2)
Alkaline Phosphatase: 36 U/L — ABNORMAL LOW (ref 39–117)
BUN: 18 mg/dL (ref 6–23)
CO2: 21 mEq/L (ref 19–32)
Calcium: 9.1 mg/dL (ref 8.4–10.5)
Chloride: 108 mEq/L (ref 96–112)
Creatinine, Ser: 1.67 mg/dL — ABNORMAL HIGH (ref 0.40–1.50)
GFR: 45.4 mL/min — ABNORMAL LOW (ref >60.00)
Glucose, Bld: 86 mg/dL (ref 70–99)
Potassium: 4.5 mEq/L (ref 3.5–5.1)
Sodium: 140 mEq/L (ref 135–145)
Total Bilirubin: 1.1 mg/dL (ref 0.2–1.2)
Total Protein: 6.5 g/dL (ref 6.0–8.3)

## 2022-01-21 ENCOUNTER — Other Ambulatory Visit: Payer: Self-pay | Admitting: *Deleted

## 2022-01-21 MED ORDER — METOPROLOL SUCCINATE ER 50 MG PO TB24: 50.0000 mg | ORAL_TABLET | Freq: Two times a day (BID) | ORAL | 2 refills | Status: DC

## 2022-01-25 ENCOUNTER — Ambulatory Visit
Admission: RE | Admit: 2022-01-25 | Discharge: 2022-01-25 | Disposition: A | Discharge status: Home / Self Care | Payer: No Typology Code available for payment source | Source: Ambulatory Visit | Attending: Family | Admitting: Family

## 2022-01-25 ENCOUNTER — Other Ambulatory Visit: Payer: Self-pay | Admitting: Family

## 2022-01-25 DIAGNOSIS — R7989 Other specified abnormal findings of blood chemistry: Secondary | ICD-10-CM

## 2022-02-05 ENCOUNTER — Telehealth: Payer: Self-pay

## 2022-02-06 MED ORDER — APIXABAN 5 MG PO TABS: 5.0000 mg | ORAL_TABLET | Freq: Two times a day (BID) | ORAL | 1 refills | Status: DC

## 2022-02-06 MED ORDER — APIXABAN 5 MG PO TABS: 5.0000 mg | ORAL_TABLET | Freq: Two times a day (BID) | ORAL | 3 refills | Status: DC

## 2022-02-06 NOTE — Telephone Encounter (Signed)
55 M 117.5 kg, SCr 1.67, LOV Hilty 5/23

## 2022-02-08 IMAGING — DX DG CHEST 2V
2 series · 2 of 2 positions shown · non-contrast
Comparison: Chest radiograph dated 12/21/2020

CLINICAL DATA: Atrial fibrillation and congestive heart failure.

EXAM:
CHEST - 2 VIEW

[chest pa]
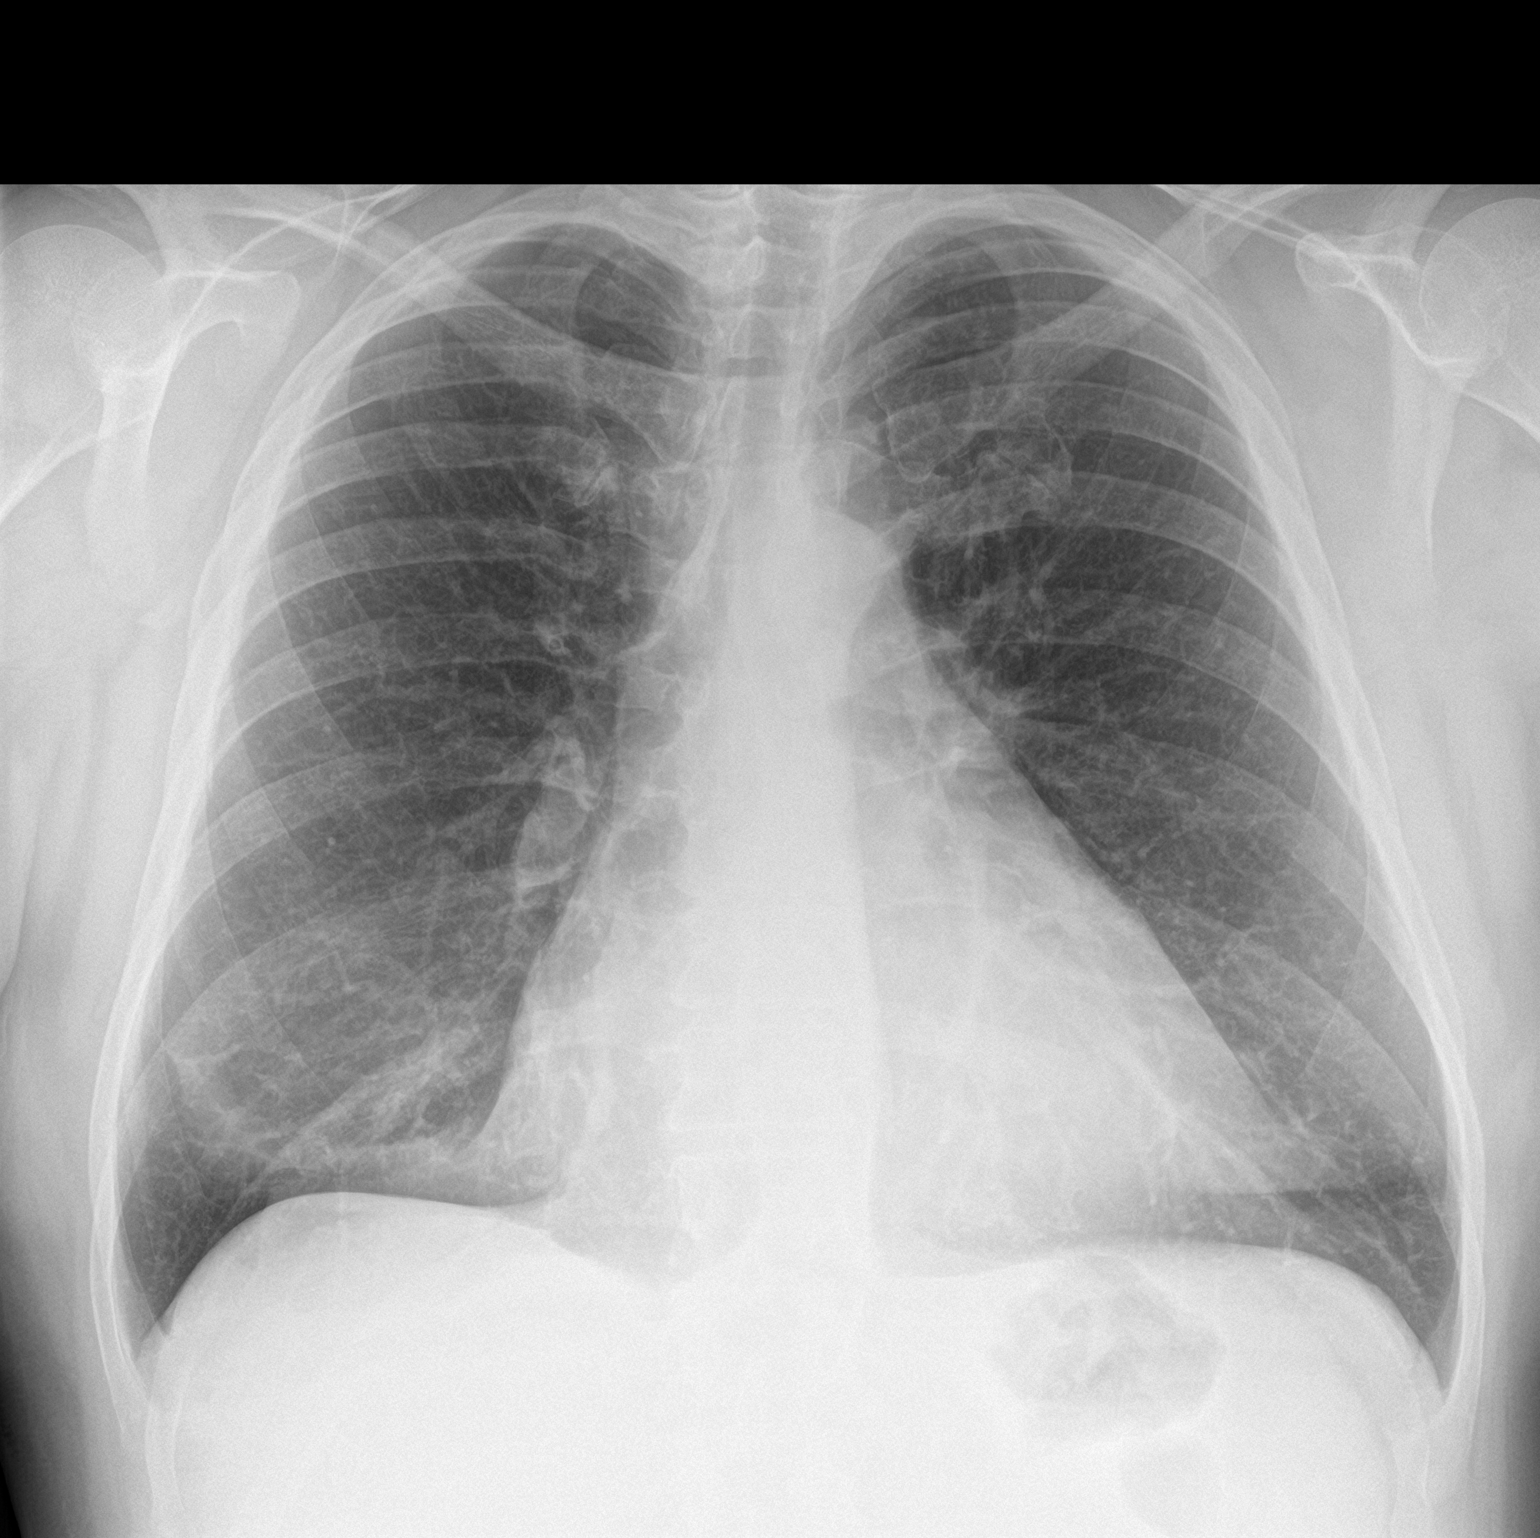

[chest lat]
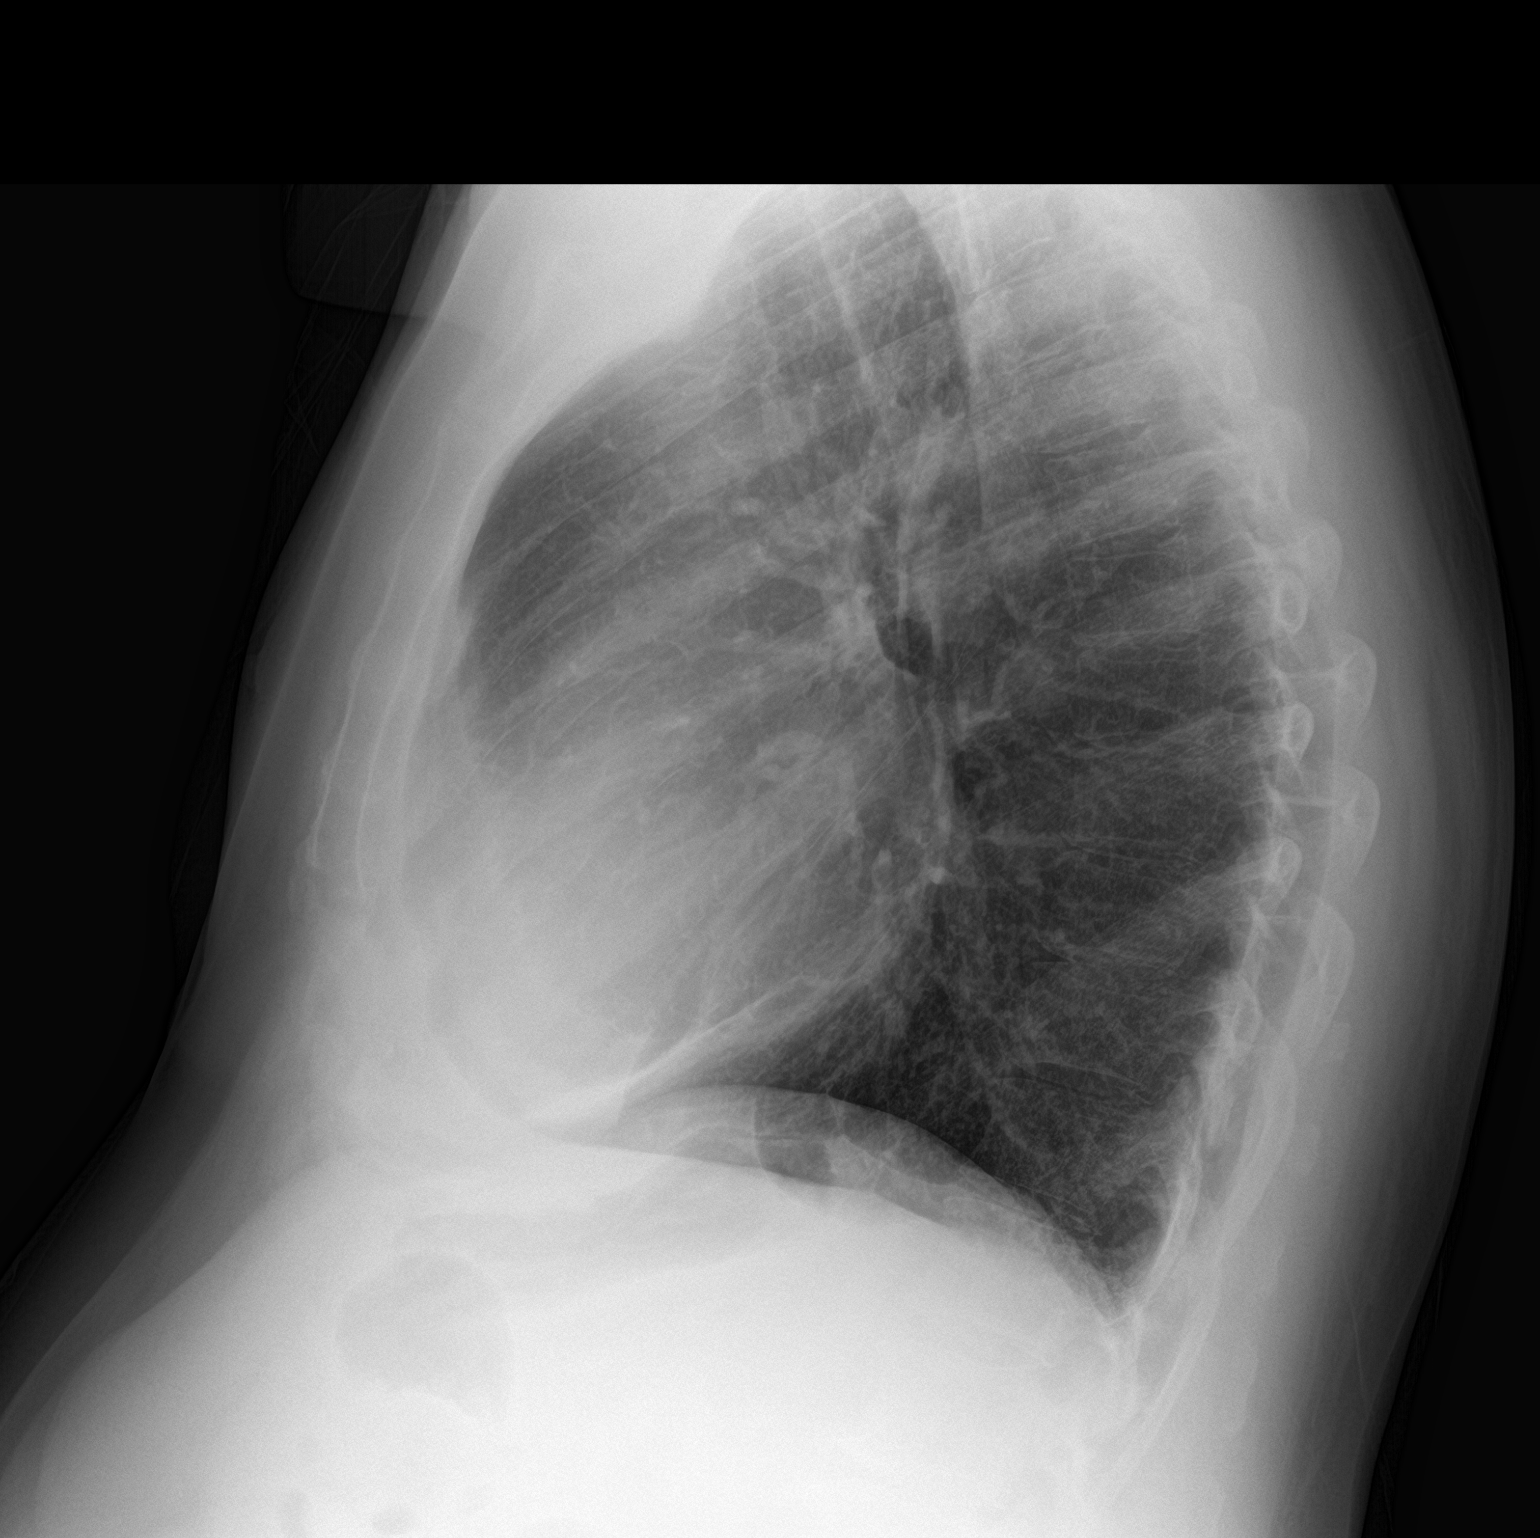

[2 of 2 positions shown; findings below may reference images not displayed]

FINDINGS: The heart remains mildly enlarged. Both lungs are clear. The
visualized skeletal structures are unremarkable.
IMPRESSION: Mild cardiomegaly.  No acute pulmonary process.

## 2022-02-11 ENCOUNTER — Other Ambulatory Visit: Payer: Self-pay | Admitting: Family

## 2022-02-11 DIAGNOSIS — F411 Generalized anxiety disorder: Secondary | ICD-10-CM

## 2022-02-13 ENCOUNTER — Telehealth: Payer: Self-pay | Admitting: Internal Medicine

## 2022-02-13 NOTE — Telephone Encounter (Signed)
Farxiga patient assistance application was left for patient to pick up on 12/10/21. Patient never picked up. Mailed to his home on 02/13/22

## 2022-02-15 ENCOUNTER — Other Ambulatory Visit: Payer: Self-pay | Admitting: *Deleted

## 2022-02-15 DIAGNOSIS — I4891 Unspecified atrial fibrillation: Secondary | ICD-10-CM

## 2022-02-15 MED ORDER — APIXABAN 5 MG PO TABS: 5.0000 mg | ORAL_TABLET | Freq: Two times a day (BID) | ORAL | 1 refills | Status: DC

## 2022-02-15 NOTE — Telephone Encounter (Signed)
Eliquis 5mg  paper refill request received. Patient is 56 years old, weight-117.5kg, Crea-1.67 on 01/14/2022, Diagnosis-Afib, and last seen by 03/16/2022 on 01/14/2022. Dose is appropriate based on dosing criteria. Will send in refill to requested pharmacy.

## 2022-02-18 ENCOUNTER — Other Ambulatory Visit: Payer: Self-pay

## 2022-02-18 ENCOUNTER — Other Ambulatory Visit: Payer: Self-pay | Admitting: General Practice

## 2022-02-18 DIAGNOSIS — N179 Acute kidney failure, unspecified: Secondary | ICD-10-CM

## 2022-02-18 DIAGNOSIS — I5042 Chronic combined systolic (congestive) and diastolic (congestive) heart failure: Secondary | ICD-10-CM

## 2022-02-18 DIAGNOSIS — I4891 Unspecified atrial fibrillation: Secondary | ICD-10-CM

## 2022-02-18 LAB — BASIC METABOLIC PANEL WITH GFR
BUN/Creatinine Ratio: 9 (ref 9–20)
BUN: 17 mg/dL (ref 6–24)
CO2: 23 mmol/L (ref 20–29)
Calcium: 9.4 mg/dL (ref 8.7–10.2)
Chloride: 104 mmol/L (ref 96–106)
Creatinine, Ser: 1.86 mg/dL — ABNORMAL HIGH (ref 0.76–1.27)
Glucose: 92 mg/dL (ref 70–99)
Potassium: 4.3 mmol/L (ref 3.5–5.2)
Sodium: 138 mmol/L (ref 134–144)
eGFR: 42 mL/min/1.73 — ABNORMAL LOW

## 2022-02-19 ENCOUNTER — Other Ambulatory Visit: Payer: Self-pay

## 2022-02-19 ENCOUNTER — Telehealth: Payer: Self-pay | Admitting: Physician Assistant

## 2022-02-19 ENCOUNTER — Telehealth: Payer: Self-pay | Admitting: General Practice

## 2022-02-19 DIAGNOSIS — Z79899 Other long term (current) drug therapy: Secondary | ICD-10-CM

## 2022-02-19 DIAGNOSIS — I10 Essential (primary) hypertension: Secondary | ICD-10-CM

## 2022-02-19 MED ORDER — LOSARTAN POTASSIUM 25 MG PO TABS: 25.0000 mg | ORAL_TABLET | Freq: Every day | ORAL | 3 refills | Status: DC

## 2022-02-19 NOTE — Telephone Encounter (Signed)
Patient given lab results per J. Molli Hazard, FNP "Please contact Mr. Kanner and let him know that his lab work from yesterday has been reviewed.  His creatinine is elevated at 1.86.  We will discontinue his valsartan.  In 2 weeks we will start losartan 25 mg daily.  1 week after initiation of his losartan we will repeat a BMP.  Thank you." Patient voiced understanding to stop taking valsartan. He will begin taking losartan 25 mg daily beginning on 6/28 and will get BMP on July 5th. Orders placed. Lab slip mailed to patient.

## 2022-02-19 NOTE — Telephone Encounter (Signed)
Patient wants clarification on if he is supposed to take farxiga. According to visit notes on 5/8, the PCP wanted patient to stop taking farxiga and entresto. There was an order to stop entresto but not farxiga. Do you want patient to discontinue taking farxiga?

## 2022-02-19 NOTE — Telephone Encounter (Signed)
Patient is calling to talk with Coletta Memos or nurse in regards to results

## 2022-02-19 NOTE — Telephone Encounter (Signed)
Pt returning nurse's call. Please advise

## 2022-02-20 ENCOUNTER — Other Ambulatory Visit: Payer: Self-pay

## 2022-02-20 NOTE — Telephone Encounter (Signed)
Patient informed he can stop taking farxiga as well as the entresto. He voiced understanding.

## 2022-02-22 ENCOUNTER — Ambulatory Visit: Payer: Self-pay | Admitting: Nurse Practitioner

## 2022-03-04 ENCOUNTER — Telehealth: Payer: Self-pay | Admitting: General Practice

## 2022-03-04 DIAGNOSIS — I10 Essential (primary) hypertension: Secondary | ICD-10-CM

## 2022-03-04 MED ORDER — LOSARTAN POTASSIUM 25 MG PO TABS: 25.0000 mg | ORAL_TABLET | Freq: Every day | ORAL | 3 refills | Status: DC

## 2022-03-04 NOTE — Telephone Encounter (Signed)
*  STAT* If patient is at the pharmacy, call can be transferred to refill team.   1. Which medications need to be refilled? (please list name of each medication and dose if known) losartan (COZAAR) 25 MG tablet  2. Which pharmacy/location (including street and city if local pharmacy) is medication to be sent to? [id number] - [place], [place] - [address]  3. Do they need a 30 day or 90 day supply? 90 day supply   Patient states pharmacy advised him today they have not received prescription.

## 2022-03-05 ENCOUNTER — Telehealth: Payer: Self-pay | Admitting: Internal Medicine

## 2022-03-05 DIAGNOSIS — I10 Essential (primary) hypertension: Secondary | ICD-10-CM

## 2022-03-05 MED ORDER — LOSARTAN POTASSIUM 25 MG PO TABS: 25.0000 mg | ORAL_TABLET | Freq: Every day | ORAL | 3 refills | Status: DC

## 2022-03-05 NOTE — Telephone Encounter (Signed)
*  STAT* If patient is at the pharmacy, call can be transferred to refill team.   1. Which medications need to be refilled? (please list name of each medication and dose if known)   losartan (COZAAR) 25 MG tablet    2. Which pharmacy/location (including street and city if local pharmacy) is medication to be sent to? [facility] [address] [place], [place] - [address]  3. Do they need a 30 day or 90 day supply?  90 day   Spouse states that pt is due to start medication tomorrow and needs today. 2nd attempt. Please advise

## 2022-03-13 ENCOUNTER — Other Ambulatory Visit: Payer: Self-pay | Admitting: Family

## 2022-03-13 DIAGNOSIS — F411 Generalized anxiety disorder: Secondary | ICD-10-CM

## 2022-03-13 NOTE — Telephone Encounter (Signed)
Requesting: Xanax 0.5 MG Contract: None UDS: None Last Visit: 01/04/22 Next Visit: Not scheduled Last Refill: 02/12/22  Please Advise

## 2022-03-22 LAB — BASIC METABOLIC PANEL
BUN/Creatinine Ratio: 11 (ref 9–20)
BUN: 15 mg/dL (ref 6–24)
CO2: 22 mmol/L (ref 20–29)
Calcium: 9.3 mg/dL (ref 8.7–10.2)
Chloride: 106 mmol/L (ref 96–106)
Creatinine, Ser: 1.37 mg/dL — ABNORMAL HIGH (ref 0.76–1.27)
Glucose: 107 mg/dL — ABNORMAL HIGH (ref 70–99)
Potassium: 4.1 mmol/L (ref 3.5–5.2)
Sodium: 141 mmol/L (ref 134–144)
eGFR: 61 mL/min/{1.73_m2} (ref >59)

## 2022-03-29 ENCOUNTER — Ambulatory Visit: Payer: Self-pay | Admitting: Nurse Practitioner

## 2022-04-13 ENCOUNTER — Other Ambulatory Visit: Payer: Self-pay | Admitting: Family

## 2022-04-13 DIAGNOSIS — F411 Generalized anxiety disorder: Secondary | ICD-10-CM

## 2022-04-15 NOTE — Telephone Encounter (Signed)
Requesting: alprazolam 0.5mg   Contract: None UDS: None Last Visit: 01/04/22 Next Visit: None Last Refill: 03/14/22 #90 and 0RF  Please Advise

## 2022-04-18 ENCOUNTER — Encounter: Payer: Self-pay | Admitting: Family

## 2022-04-22 ENCOUNTER — Ambulatory Visit (INDEPENDENT_AMBULATORY_CARE_PROVIDER_SITE_OTHER): Payer: Self-pay | Admitting: Internal Medicine

## 2022-04-22 ENCOUNTER — Encounter: Payer: Self-pay | Admitting: Internal Medicine

## 2022-04-22 VITALS — BP 138/80 | HR 115 | Ht 73.0 in | Wt 267.8 lb

## 2022-04-22 DIAGNOSIS — F1010 Alcohol abuse, uncomplicated: Secondary | ICD-10-CM

## 2022-04-22 DIAGNOSIS — Z9189 Other specified personal risk factors, not elsewhere classified: Secondary | ICD-10-CM

## 2022-04-22 DIAGNOSIS — E781 Pure hyperglyceridemia: Secondary | ICD-10-CM

## 2022-04-22 DIAGNOSIS — I5043 Acute on chronic combined systolic (congestive) and diastolic (congestive) heart failure: Secondary | ICD-10-CM

## 2022-04-22 MED ORDER — FUROSEMIDE 40 MG PO TABS: 40.0000 mg | ORAL_TABLET | Freq: Every day | ORAL | 3 refills | Status: DC

## 2022-04-22 NOTE — Patient Instructions (Signed)
Medication Instructions:  START Lasix 40 mg daily   *If you need a refill on your cardiac medications before your next appointment, please call your pharmacy*   Lab Work: LIPID, CMET, BNP today   If you have labs (blood work) drawn today and your tests are completely normal, you will receive your results only by: MyChart Message (if you have MyChart) OR A paper copy in the mail If you have any lab test that is abnormal or we need to change your treatment, we will call you to review the results.   Follow-Up: At [facility], you and your health needs are our priority.  As part of our continuing mission to provide you with exceptional heart care, we have created designated Provider Care Teams.  These Care Teams include your primary Cardiologist (physician) and Advanced Practice Providers (APPs -  Physician Assistants and Nurse Practitioners) who all work together to provide you with the care you need, when you need it.  We recommend signing up for the patient portal called "MyChart".  Sign up information is provided on this After Visit Summary.  MyChart is used to connect with patients for Virtual Visits (Telemedicine).  Patients are able to view lab/test results, encounter notes, upcoming appointments, etc.  Non-urgent messages can be sent to your provider as well.   To learn more about what you can do with MyChart, go to [email or web address].    Your next appointment:   3 month(s)  The format for your next appointment:   In Person  Provider:   Chrystie Nose, MD or APP

## 2022-04-22 NOTE — Progress Notes (Signed)
OFFICE CONSULT NOTE  Chief Complaint:  Follow-up  Primary Care Physician: Olive Bass, FNP  HPI:  Daniel Butler is a 56 y.o. male who is being seen today for the evaluation of high triglycerides at the request of Carley Hammed, NP. This is a pleasant 56 year old male was kindly referred for evaluation of high triglycerides.  He has a long history of elevated cholesterol and primarily elevated triglycerides.  Unfortunately suffered a brain aneurysm several years ago.  He did survive however he struggled with depression and anxiety.  He tells me he has not worked since then but fortunately he says just prior to that he claims he won the lottery and says that he still has over $1 million.  Recently he said his wife left him because of heavy alcohol use.  He had previously drank more than 30 beers a week and typically drinks 6-12 beers per night.  Generally eats very little and seems to have poor nutrition.  He also seems to be somewhat unkempt today in the office.  He reports significant symptoms concerning for PTSD and shies away from driving or getting out of his house much.  He did say that his wife would be returning next month and he said that may help him "get on track".  Any chest pain or worsening shortness of breath.  He has no history of known coronary artery disease.  He reports being adopted and does not know his family history.  09/30/2018  Daniel Butler returns to follow-up on his dyslipidemia.  Now seems to be tolerating Vascepa and has had a reduction in triglycerides from 585-378.  We did a direct LDL which is markedly elevated 148.  Unfortunately his alcohol use continues.  We discussed how important would be to significantly reduce that and seek treatment and provided treatment information for him.  He is accompanied by his spouse today.  04/22/2022  Daniel Butler is seen today in follow-up.  He is recently been followed by our nurse practitioners.  In April  2022 he underwent right and left heart catheterization for heart failure.  LVEF at that time was 35 to 40%.  He was started on guideline directed medical therapy including Entresto and Jardiance but had issues with dizziness, hypotension and dehydration.  He was then taken off of the medications but has not been on a diuretic.  He was using alcohol more heavily but has recently decreased that.  He says he is now down to about 1-2 beers a day.  He reports medication compliance.  He has not had any recent labs.  He did have an echo in March of this year which showed his EF had improved slightly to 40 to 45%.  He was also noted to have a dilated ascending aorta, measuring 43 mm.  A repeat echo was recommended in 1 year.  Over the past several weeks he says he has become more short of breath.  He is gained about 8 pounds.  He feels more tightness in his abdomen.  He has had some lower extremity swelling.  He reports he is actually eating less than he has previously.  He has also reported recent shortness of breath at night and awakening gasping for breath.  He has had noted snoring as well as nonrestorative sleep.  This is concerning for obstructive sleep apnea.  PMHx:  Past Medical History:  Diagnosis Date   Brain aneurysm    Chicken pox    Depression  Gout    High cholesterol    Hypertension     Past Surgical History:  Procedure Laterality Date   CARDIOVERSION N/A 01/22/2021   Procedure: CARDIOVERSION;  Surgeon: Meriam Sprague, MD;  Location: [facility] ENDOSCOPY;  Service: Cardiovascular;  Laterality: N/A;   CRANIOTOMY Right 03/02/2015   Procedure: CRANIOTOMY INTRACRANIAL  ANEURYSM FOR CLIPPING;  Surgeon: Lisbeth Renshaw, MD;  Location: [facility] NEURO ORS;  Service: Neurosurgery;  Laterality: Right;   IR GENERIC HISTORICAL  02/16/2016   IR ANGIO VERTEBRAL SEL VERTEBRAL BILAT MOD SED 02/16/2016 Lisbeth Renshaw, MD [facility]-INTERV RAD   IR GENERIC HISTORICAL  02/16/2016   IR ANGIO INTRA EXTRACRAN SEL INTERNAL  CAROTID BILAT MOD SED 02/16/2016 Lisbeth Renshaw, MD [facility]-INTER[facility] RAD   RADIOLOGY WITH ANESTHESIA N/A 03/02/2015   Procedure: RADIOLOGY WITH ANESTHESIA;  Surgeon: Lisbeth Renshaw, MD;  Location: [facility] OR;  Service: Radiology;  Laterality: N/A;   RIGHT/LEFT HEART CATH AND CORONARY ANGIOGRAPHY N/A 12/19/2020   Procedure: RIGHT/LEFT HEART CATH AND CORONARY ANGIOGRAPHY;  Surgeon: [place], Peter M, MD;  Location: [facility] INVASIVE CV LAB;  Service: Cardiovascular;  Laterality: N/A;   WISDOM TOOTH EXTRACTION      FAMHx:  Family History  Adopted: Yes    SOCHx:   reports that he has been smoking cigarettes. He has a 62.50 pack-year smoking history. He has never used smokeless tobacco. He reports current alcohol use of about 70.0 standard drinks of alcohol per week. He reports that he does not use drugs.  ALLERGIES:  Allergies  Allergen Reactions   Zetia [Ezetimibe] Nausea And Vomiting    ROS: Pertinent items noted in HPI and remainder of comprehensive ROS otherwise negative.  HOME MEDS: Current Outpatient Medications on File Prior to Visit  Medication Sig Dispense Refill   allopurinol (ZYLOPRIM) 100 MG tablet Take 1 tablet (100 mg total) by mouth daily. 90 tablet 3   ALPRAZolam (XANAX) 0.5 MG tablet TAKE 1 TABLET BY MOUTH THREE TIMES A DAY 90 tablet 0   apixaban (ELIQUIS) 5 MG TABS tablet Take 1 tablet (5 mg total) by mouth 2 (two) times daily. 180 tablet 1   fenofibrate (TRICOR) 145 MG tablet Take 1 tablet (145 mg total) by mouth daily. 90 tablet 3   folic acid (FOLVITE) 1 MG tablet Take 1 tablet (1 mg total) by mouth daily. 90 tablet 3   icosapent Ethyl (VASCEPA) 1 g capsule Take 2 capsules (2 g total) by mouth 2 (two) times daily. 360 capsule 2   losartan (COZAAR) 25 MG tablet Take 1 tablet (25 mg total) by mouth daily. 90 tablet 3   metoprolol succinate (TOPROL-XL) 50 MG 24 hr tablet Take 1 tablet (50 mg total) by mouth 2 (two) times daily. 90 tablet 2   rosuvastatin (CRESTOR) 40 MG tablet TAKE 1  TABLET BY MOUTH ONCE DAILY 90 tablet 3   thiamine (VITAMIN B-1) 100 MG tablet Take 100 mg by mouth daily.     No current facility-administered medications on file prior to visit.    LABS/IMAGING: No results found for this or any previous visit (from the past 48 hour(s)). No results found.  LIPID PANEL:    Component Value Date/Time   CHOL 107 11/13/2020 1041   TRIG 233 (H) 11/13/2020 1041   HDL 33 (L) 11/13/2020 1041   CHOLHDL 3.5 10/20/2019 1212   CHOLHDL 4 02/22/2019 1444   VLDL 53.8 (H) 02/22/2019 1444   LDLCALC 38 11/13/2020 1041   LDLDIRECT 48 10/20/2019 1212   LDLDIRECT 58.0 02/22/2019 1444  WEIGHTS: Wt Readings from Last 3 Encounters:  04/22/22 267 lb 12.8 oz (121.5 kg)  01/14/22 259 lb (117.5 kg)  01/04/22 261 lb 9.6 oz (118.7 kg)    VITALS: BP 138/80   Pulse (!) 115   Ht 6\' 1"  (1.854 m)   Wt 267 lb 12.8 oz (121.5 kg)   SpO2 96%   BMI 35.33 kg/m   EXAM: General appearance: alert, no distress, and moderately obese Neck: JVD - 5 cm above sternal notch, no carotid bruit, and thyroid not enlarged, symmetric, no tenderness/mass/nodules Lungs: diminished breath sounds bibasilar Heart: irregularly irregular rhythm and tachycardic Abdomen: Obese, protuberant, no fluid wave Extremities: edema 1+ bilateral pitting Pulses: 2+ and symmetric Skin: Skin color, texture, turgor normal. No rashes or lesions Neurologic: Grossly normal Deferred  EKG: A-fib with rapid ventricular response at 124-personally reviewed  ASSESSMENT: Acute on chronic systolic congestive heart failure, LVEF 40 to 45%, NYHA class II-III symptoms Nonischemic cardiomyopathy Secondary hypertriglyceridemia History of heavy etoh abuse Tobacco abuse Anxiety/depression - ?PTSD At risk for obstructive sleep apnea -stop-bang score of 8  PLAN: 1.   Daniel Butler is complaining of worsening shortness of breath, weight gain and increased abdominal girth concerning for acute on chronic heart failure.   He was previously on Joycelyn Man but was taken off due to low blood pressure and dizziness.  He was not placed on a diuretic and unfortunately appears to be gaining fluid weight.  We will plan on starting Lasix 40 mg daily today.  Check a metabolic profile, repeat lipid profile and BNP.  He is in A-fib with RVR but reports heart rates controlled at home better.  He has not taken his medicines this morning.  He is fasting.  He has reported some shortness of breath and is awakened from sleep gasping.  He is noted to snore and is fatigued throughout the day.  This is concerning for obstructive sleep apnea.  I would like him to have a home sleep study.  He is self-pay and will look into the finances of that.  Plan follow-up in about 3 months.  [place] MD, [facility], FACP  [place]  [facility]  Medical Director of the [facility]  Cardiovascular Risk Reduction Clinic Diplomate of the American Board of Clinical Lipidology Attending Cardiologist  Direct Dial: [phone]  Fax: [phone]  Website:  [email or web address] 04/22/2022, 9:31 AM

## 2022-04-23 ENCOUNTER — Encounter: Payer: Self-pay | Admitting: Internal Medicine

## 2022-04-23 LAB — COMPREHENSIVE METABOLIC PANEL
ALT: 27 IU/L (ref 0–44)
AST: 53 IU/L — ABNORMAL HIGH (ref 0–40)
Albumin/Globulin Ratio: 1.5 (ref 1.2–2.2)
Albumin: 3.7 g/dL — ABNORMAL LOW (ref 3.8–4.9)
Alkaline Phosphatase: 31 IU/L — ABNORMAL LOW (ref 44–121)
BUN/Creatinine Ratio: 10 (ref 9–20)
BUN: 13 mg/dL (ref 6–24)
Bilirubin Total: 0.9 mg/dL (ref 0.0–1.2)
CO2: 22 mmol/L (ref 20–29)
Calcium: 8.9 mg/dL (ref 8.7–10.2)
Chloride: 104 mmol/L (ref 96–106)
Creatinine, Ser: 1.27 mg/dL (ref 0.76–1.27)
Globulin, Total: 2.5 g/dL (ref 1.5–4.5)
Glucose: 96 mg/dL (ref 70–99)
Potassium: 3.6 mmol/L (ref 3.5–5.2)
Sodium: 139 mmol/L (ref 134–144)
Total Protein: 6.2 g/dL (ref 6.0–8.5)
eGFR: 66 mL/min/{1.73_m2} (ref >59)

## 2022-04-23 LAB — LIPID PANEL
Chol/HDL Ratio: 3 ratio (ref 0.0–5.0)
Cholesterol, Total: 93 mg/dL — ABNORMAL LOW (ref 100–199)
HDL: 31 mg/dL — ABNORMAL LOW (ref >39)
LDL Chol Calc (NIH): 40 mg/dL (ref 0–99)
Triglycerides: 118 mg/dL (ref 0–149)
VLDL Cholesterol Cal: 22 mg/dL (ref 5–40)

## 2022-04-23 LAB — BRAIN NATRIURETIC PEPTIDE: BNP: 138.8 pg/mL — ABNORMAL HIGH (ref 0.0–100.0)

## 2022-04-24 ENCOUNTER — Encounter: Payer: Self-pay | Admitting: Internal Medicine

## 2022-04-26 ENCOUNTER — Telehealth: Payer: Self-pay | Admitting: *Deleted

## 2022-04-26 ENCOUNTER — Telehealth: Payer: Self-pay

## 2022-04-26 NOTE — Telephone Encounter (Signed)
Called and made the patient aware that He may proceed with the Itamar Home Sleep Study. PIN # provided to the patient. Patient made aware that He will be contacted after the test has been read with the results and any recommendations. Patient verbalized understanding and thanked me for the call.   

## 2022-04-26 NOTE — Telephone Encounter (Signed)
Staff message sent to Daniel Butler ok to activate itamar. Per chart patient as no insurance.

## 2022-04-29 ENCOUNTER — Encounter: Payer: Self-pay | Admitting: *Deleted

## 2022-05-03 ENCOUNTER — Ambulatory Visit: Payer: Self-pay | Admitting: Nurse Practitioner

## 2022-05-06 ENCOUNTER — Encounter: Payer: Self-pay | Admitting: Family

## 2022-05-07 ENCOUNTER — Other Ambulatory Visit: Payer: Self-pay | Admitting: Family

## 2022-05-07 DIAGNOSIS — K5909 Other constipation: Secondary | ICD-10-CM

## 2022-05-14 ENCOUNTER — Other Ambulatory Visit: Payer: Self-pay | Admitting: Family

## 2022-05-14 ENCOUNTER — Encounter: Payer: Self-pay | Admitting: Family

## 2022-05-14 DIAGNOSIS — F411 Generalized anxiety disorder: Secondary | ICD-10-CM

## 2022-05-14 NOTE — Telephone Encounter (Signed)
Patient is requesting a refill of the following medications: Requested Prescriptions   Pending Prescriptions Disp Refills   ALPRAZolam (XANAX) 0.5 MG tablet [Pharmacy Med Name: ALPRAZOLAM 0.5 MG TABLET] 90 tablet 0    Sig: TAKE 1 TABLET BY MOUTH THREE TIMES A DAY    Requesting: 05/14/22 Last Refill: 04/15/22  Please Advise

## 2022-06-04 ENCOUNTER — Telehealth: Payer: Self-pay

## 2022-06-04 NOTE — Telephone Encounter (Signed)
Patient returned Itamar device on 06/03/22. Per patient states he doesn't want to wear the device at this time.

## 2022-06-10 ENCOUNTER — Ambulatory Visit (INDEPENDENT_AMBULATORY_CARE_PROVIDER_SITE_OTHER): Payer: Self-pay | Admitting: Nurse Practitioner

## 2022-06-10 ENCOUNTER — Other Ambulatory Visit (INDEPENDENT_AMBULATORY_CARE_PROVIDER_SITE_OTHER): Payer: Self-pay

## 2022-06-10 ENCOUNTER — Encounter: Payer: Self-pay | Admitting: Nurse Practitioner

## 2022-06-10 VITALS — BP 70/40 | HR 43 | Ht 73.0 in | Wt 258.0 lb

## 2022-06-10 DIAGNOSIS — R7989 Other specified abnormal findings of blood chemistry: Secondary | ICD-10-CM

## 2022-06-10 LAB — HEPATIC FUNCTION PANEL
ALT: 39 U/L (ref 0–53)
AST: 75 U/L — ABNORMAL HIGH (ref 0–37)
Albumin: 3.6 g/dL (ref 3.5–5.2)
Alkaline Phosphatase: 30 U/L — ABNORMAL LOW (ref 39–117)
Bilirubin, Direct: 0.4 mg/dL — ABNORMAL HIGH (ref 0.0–0.3)
Total Bilirubin: 0.9 mg/dL (ref 0.2–1.2)
Total Protein: 7 g/dL (ref 6.0–8.3)

## 2022-06-10 NOTE — Progress Notes (Signed)
Assessment and plan noted ?

## 2022-06-10 NOTE — Progress Notes (Signed)
Chief Complaint:  reschedule from earlier appt for evaluation of abnormal liver tests   Assessment &  Plan   # 56 yo male with multiple medical problems here for evaluation of elevated LFTs in setting of Etoh abuse.  Originally referred back in May but cancelled / rescheduled a couple of times. At that time his AST / ALT were mildly elevated in pattern not suggestive of Etoh. However, since then liver enzymes have returned to baseline. Most recent labs show AST of 53 / normal ALT 24 . AST to ALT ratio suggesting Etoh related.  Update LFTs today Encouraged him to discontinue Etoh HCV ab , HBsAg negative in 2022 No liver / gallbladder findings on [place] in May 2023    # Bloating in setting of constipation.  Need to treat constipation first and see if bloating improves. Takes Dulcolax 1-2 times a week. Recommend trial of daily Miralax.   # Colon cancer screening. Originally scheduled for a colonoscopy last year but he didn't have it done. Though he still doesn't want a colonoscopy he is now at high risk for one.  Since we saw him in 2022 he has been diagnosed with non-ischemic cardiomyopathy , heart failure and AFIB for which is is on  Eliquis.  Additionally he has been struggling with dizziness, hypotension and falls at home. No blood in stool, Hgb normal. [place] is unknown - adopted   # Hypotension / dizziness with standing. BP 70/40 sitting in office. He explains that this has been an intermittent problem and Cardiology has been adjusting his medications I am concerned that he may have another fall at home but HE DOES NOT WANT TO GO TO ED. I advised him and wife to at least notify Cardiology's office ASAP about symptoms and today's BP   HPI   Daniel Butler is a 56 y.o. male known to Dr. Henrene Pastor with a multiple medical problems not limited to right MCA aneurysm and subarchnoid hemorrhage in 2016, PTSD, anxiety, depression, non-ischemic cardiomyopathy, chronic systolic heart failure, Afib  with RVR, Etoh abuse, tobacco abuse, hyperlipidemia, HTN,  vertigo, gout. See [place] /PSH for additional history   GI History with [place]:  Jarod was seen in March 2022 as a new patient.  He came for evaluation of rectal discharge and constipation.  He was scheduled for screening colonoscopy which was not done. He is self-pay, couldn't afford the procedure.   Interval History :  A lot has a happened with Daniel Butler since our March 2022 visit. He has developed AFib , chronic systolic heart failure, non-ischemic cardiomyopathy ( ? From Etoh use). He is on chronic eliquis. He has been struggling with dizziness / falls at home. Unfortunately he continues to smoke tobacco and drink Etoh.   Patient was referred here by PCP several months ago for elevated LFTs.  He cancelled / no showed in June and again in July.   Labs:  April 2023  - AST 109, ALT 82.   May 2023  -  AST 119 , ALT of 84.   August 2023 liver tests improved. AST 53 / ALT normal.  Remainder of hepatic function panel is,  and has historically been normal.  April 2022 - HB surface ag negative, HCV ab negative.   Abdominal ultrasound in May 2023 did not show any abnormalities of the liver,  normal gallbladder.  CBD 2 mm  Patient doesn't think he started any new meds around the time liver enzymes rose.  A year ago he  cut way back on Etoh intake. Now drinking 1-2 shots / day plus 1-2 beers / day.  Council frequently feels bloated. He takes Dulcolax about 1-2 times a week for constipation. Still has clear rectal discharge sometimes. No blood in stool.   Previous GI Evaluation  none    Labs:     Latest Ref Rng & Units 01/04/2022    2:17 PM 01/02/2021   12:15 PM 12/21/2020    2:06 AM  CBC  WBC 3.8 - 10.8 Thousand/uL 9.8  9.4  7.9   Hemoglobin 13.2 - 17.1 g/dL 15.1  17.3  14.7   Hematocrit 38.5 - 50.0 % 44.6  52.9  43.6   Platelets 140 - 400 Thousand/uL 264  370.0  233        Latest Ref Rng & Units 04/22/2022    9:58 AM 01/14/2022    3:29  PM 01/04/2022    2:17 PM  Hepatic Function  Total Protein 6.0 - 8.5 g/dL 6.2  6.5  6.5   Albumin 3.8 - 4.9 g/dL 3.7  3.9    AST 0 - 40 IU/L 53  119  109   ALT 0 - 44 IU/L 27  84  82   Alk Phosphatase 44 - 121 IU/L 31  36    Total Bilirubin 0.0 - 1.2 mg/dL 0.9  1.1  0.8      Past Medical History:  Diagnosis Date   Brain aneurysm    Chicken pox    Depression    Elevated LFTs    Gout    Hernia of abdominal cavity 11/2020   High cholesterol    Hypertension     Past Surgical History:  Procedure Laterality Date   CARDIOVERSION N/A 01/22/2021   Procedure: CARDIOVERSION;  Surgeon: Freada Bergeron, MD;  Location: [facility] ENDOSCOPY;  Service: Cardiovascular;  Laterality: N/A;   CRANIOTOMY Right 03/02/2015   Procedure: CRANIOTOMY INTRACRANIAL  ANEURYSM FOR CLIPPING;  Surgeon: Consuella Lose, MD;  Location: [place] NEURO ORS;  Service: Neurosurgery;  Laterality: Right;   IR GENERIC HISTORICAL  02/16/2016   IR ANGIO VERTEBRAL SEL VERTEBRAL BILAT MOD SED 02/16/2016 Consuella Lose, MD [facility]-INTERV RAD   IR GENERIC HISTORICAL  02/16/2016   IR ANGIO INTRA EXTRACRAN SEL INTERNAL CAROTID BILAT MOD SED 02/16/2016 Consuella Lose, MD [facility]-INTER[facility] RAD   RADIOLOGY WITH ANESTHESIA N/A 03/02/2015   Procedure: RADIOLOGY WITH ANESTHESIA;  Surgeon: Consuella Lose, MD;  Location: [place];  Service: Radiology;  Laterality: N/A;   RIGHT/LEFT HEART CATH AND CORONARY ANGIOGRAPHY N/A 12/19/2020   Procedure: RIGHT/LEFT HEART CATH AND CORONARY ANGIOGRAPHY;  Surgeon: [place], Peter M, MD;  Location: [place] CV LAB;  Service: Cardiovascular;  Laterality: N/A;   WISDOM TOOTH EXTRACTION      Current Medications, Allergies, Family History and Social History were reviewed in [employer] record.     Current Outpatient Medications  Medication Sig Dispense Refill   allopurinol (ZYLOPRIM) 100 MG tablet Take 1 tablet (100 mg total) by mouth daily. 90 tablet 3   ALPRAZolam (XANAX) 0.5 MG tablet TAKE 1  TABLET BY MOUTH THREE TIMES A DAY 90 tablet 0   apixaban (ELIQUIS) 5 MG TABS tablet Take 1 tablet (5 mg total) by mouth 2 (two) times daily. 180 tablet 1   fenofibrate (TRICOR) 145 MG tablet Take 1 tablet (145 mg total) by mouth daily. 90 tablet 3   folic acid (FOLVITE) 1 MG tablet Take 1 tablet (1 mg total) by mouth daily. 90 tablet  3   furosemide (LASIX) 40 MG tablet Take 1 tablet (40 mg total) by mouth daily. 90 tablet 3   icosapent Ethyl (VASCEPA) 1 g capsule Take 2 capsules (2 g total) by mouth 2 (two) times daily. 360 capsule 2   losartan (COZAAR) 25 MG tablet Take 1 tablet (25 mg total) by mouth daily. 90 tablet 3   metoprolol succinate (TOPROL-XL) 50 MG 24 hr tablet Take 1 tablet (50 mg total) by mouth 2 (two) times daily. 90 tablet 2   rosuvastatin (CRESTOR) 40 MG tablet TAKE 1 TABLET BY MOUTH ONCE DAILY 90 tablet 3   thiamine (VITAMIN B-1) 100 MG tablet Take 100 mg by mouth daily.     No current facility-administered medications for this visit.    Review of Systems: No chest pain. No shortness of breath. No urinary complaints.    Physical Exam  Wt Readings from Last 3 Encounters:  06/10/22 258 lb (117 kg)  04/22/22 267 lb 12.8 oz (121.5 kg)  01/14/22 259 lb (117.5 kg)    Ht '6\' 1"'  (1.854 m)   Wt 258 lb (117 kg)   BMI 34.04 kg/m   BP 70/40 sitting Constitutional:  Obese male in wheelchair in no acute distress. Psychiatric: Pleasant.  Behavior is normal. EENT: Pupils normal.  Conjunctivae are normal. No scleral icterus. Neck supple.  Cardiovascular: Normal rate. Hypotensive.  Pulmonary/chest: Effort normal and breath sounds normal. No wheezing, rales or rhonchi. Abdominal: Limited exam in wheelchair. ( too dizzy to stand). Abdomen is soft, obese, bowel sounds active throughout. There are no masses palpable. No hepatomegaly. Neurological: Alert and oriented to person place and time. Skin: Skin is warm and dry. No rashes noted.  Tye Savoy, NP  06/10/2022, 11:38  AM  Cc:  Marrian Salvage,*

## 2022-06-10 NOTE — Patient Instructions (Addendum)
If you are age 57 or older, your body mass index should be between 23-30. Your Body mass index is 34.04 kg/m. If this is out of the aforementioned range listed, please consider follow up with your Primary Care Provider.  If you are age 19 or younger, your body mass index should be between 19-25. Your Body mass index is 34.04 kg/m. If this is out of the aformentioned range listed, please consider follow up with your Primary Care Provider.   ________________________________________________________  The [place] [facility] providers would like to encourage you to use [facility] to communicate with providers for non-urgent requests or questions.  Due to long hold times on the telephone, sending your provider a message by [facility] may be a faster and more efficient way to get a response.  Please allow 48 business hours for a response.  Please remember that this is for non-urgent requests.  _______________________________________________________   Your provider has requested that you go to the basement level for lab work before leaving today. Press "B" on the elevator. The lab is located at the first door on the left as you exit the elevator.   Due to recent changes in healthcare laws, you may see the results of your imaging and laboratory studies on MyChart before your provider has had a chance to review them.  We understand that in some cases there may be results that are confusing or concerning to you. Not all laboratory results come back in the same time frame and the provider may be waiting for multiple results in order to interpret others.  Please give [place] 48 hours in order for your provider to thoroughly review all the results before contacting the office for clarification of your results.    It was a pleasure to see you today!  Thank you for trusting me with your gastrointestinal care!

## 2022-06-11 ENCOUNTER — Other Ambulatory Visit: Payer: Self-pay

## 2022-06-11 DIAGNOSIS — R7989 Other specified abnormal findings of blood chemistry: Secondary | ICD-10-CM

## 2022-06-12 ENCOUNTER — Encounter: Payer: Self-pay | Admitting: Internal Medicine

## 2022-06-13 ENCOUNTER — Encounter: Payer: Self-pay | Admitting: Internal Medicine

## 2022-06-13 ENCOUNTER — Other Ambulatory Visit: Payer: Self-pay

## 2022-06-13 ENCOUNTER — Other Ambulatory Visit: Payer: Self-pay | Admitting: Family

## 2022-06-13 DIAGNOSIS — K921 Melena: Secondary | ICD-10-CM

## 2022-06-13 DIAGNOSIS — F411 Generalized anxiety disorder: Secondary | ICD-10-CM

## 2022-06-13 NOTE — Telephone Encounter (Signed)
Requesting: alprazolam 0.5mg   Contract: None UDS: None Last Visit:01/04/22  Next Visit: None Last Refill: 05/14/22 #90 and 0RF   Please Advise

## 2022-06-14 ENCOUNTER — Other Ambulatory Visit (INDEPENDENT_AMBULATORY_CARE_PROVIDER_SITE_OTHER): Payer: Self-pay

## 2022-06-14 ENCOUNTER — Other Ambulatory Visit: Payer: Self-pay | Admitting: Family

## 2022-06-14 DIAGNOSIS — R7989 Other specified abnormal findings of blood chemistry: Secondary | ICD-10-CM

## 2022-06-14 DIAGNOSIS — K921 Melena: Secondary | ICD-10-CM

## 2022-06-14 LAB — CBC WITH DIFFERENTIAL/PLATELET
Basophils Absolute: 0.1 10*3/uL (ref 0.0–0.1)
Basophils Relative: 0.9 % (ref 0.0–3.0)
Eosinophils Absolute: 0.2 10*3/uL (ref 0.0–0.7)
Eosinophils Relative: 1.7 % (ref 0.0–5.0)
HCT: 32.9 % — ABNORMAL LOW (ref 39.0–52.0)
Hemoglobin: 11.1 g/dL — ABNORMAL LOW (ref 13.0–17.0)
Lymphocytes Relative: 29.3 % (ref 12.0–46.0)
Lymphs Abs: 3.1 10*3/uL (ref 0.7–4.0)
MCHC: 33.8 g/dL (ref 30.0–36.0)
MCV: 104.4 fl — ABNORMAL HIGH (ref 78.0–100.0)
Monocytes Absolute: 0.5 10*3/uL (ref 0.1–1.0)
Monocytes Relative: 4.5 % (ref 3.0–12.0)
Neutro Abs: 6.8 10*3/uL (ref 1.4–7.7)
Neutrophils Relative %: 63.6 % (ref 43.0–77.0)
Platelets: 255 10*3/uL (ref 150.0–400.0)
RBC: 3.15 Mil/uL — ABNORMAL LOW (ref 4.22–5.81)
RDW: 14.8 % (ref 11.5–15.5)
WBC: 10.7 10*3/uL — ABNORMAL HIGH (ref 4.0–10.5)

## 2022-06-14 LAB — HEPATIC FUNCTION PANEL
ALT: 35 U/L (ref 0–53)
AST: 63 U/L — ABNORMAL HIGH (ref 0–37)
Albumin: 3.4 g/dL — ABNORMAL LOW (ref 3.5–5.2)
Alkaline Phosphatase: 30 U/L — ABNORMAL LOW (ref 39–117)
Bilirubin, Direct: 0.3 mg/dL (ref 0.0–0.3)
Total Bilirubin: 0.9 mg/dL (ref 0.2–1.2)
Total Protein: 6.4 g/dL (ref 6.0–8.3)

## 2022-06-17 ENCOUNTER — Encounter: Payer: Self-pay | Admitting: Family

## 2022-06-17 ENCOUNTER — Other Ambulatory Visit: Payer: Self-pay

## 2022-06-17 DIAGNOSIS — K921 Melena: Secondary | ICD-10-CM

## 2022-06-17 MED ORDER — PANTOPRAZOLE SODIUM 40 MG PO TBEC: 40.0000 mg | DELAYED_RELEASE_TABLET | Freq: Every day | ORAL | 3 refills | Status: DC

## 2022-06-21 ENCOUNTER — Encounter: Payer: Self-pay | Admitting: Family

## 2022-06-21 ENCOUNTER — Ambulatory Visit (INDEPENDENT_AMBULATORY_CARE_PROVIDER_SITE_OTHER): Payer: Self-pay | Admitting: Family

## 2022-06-21 VITALS — BP 114/70 | HR 73 | Temp 97.8°F | Ht 73.0 in | Wt 257.4 lb

## 2022-06-21 DIAGNOSIS — I4891 Unspecified atrial fibrillation: Secondary | ICD-10-CM

## 2022-06-21 DIAGNOSIS — I509 Heart failure, unspecified: Secondary | ICD-10-CM

## 2022-06-21 DIAGNOSIS — G479 Sleep disorder, unspecified: Secondary | ICD-10-CM

## 2022-06-21 DIAGNOSIS — K219 Gastro-esophageal reflux disease without esophagitis: Secondary | ICD-10-CM

## 2022-06-21 DIAGNOSIS — M1000 Idiopathic gout, unspecified site: Secondary | ICD-10-CM

## 2022-06-21 DIAGNOSIS — E785 Hyperlipidemia, unspecified: Secondary | ICD-10-CM

## 2022-06-21 DIAGNOSIS — R1310 Dysphagia, unspecified: Secondary | ICD-10-CM

## 2022-06-21 MED ORDER — FOLIC ACID 1 MG PO TABS: 1.0000 mg | ORAL_TABLET | Freq: Every day | ORAL | 3 refills | Status: DC

## 2022-06-21 MED ORDER — FENOFIBRATE 145 MG PO TABS: 145.0000 mg | ORAL_TABLET | Freq: Every day | ORAL | 3 refills | Status: DC

## 2022-06-21 MED ORDER — ALLOPURINOL 100 MG PO TABS: 100.0000 mg | ORAL_TABLET | Freq: Every day | ORAL | 3 refills | Status: DC

## 2022-06-21 NOTE — Progress Notes (Signed)
Daniel Butler is a 56 y.o. male with the following history as recorded in Epic[facility]:  Patient Active Problem List   Diagnosis Date Noted   Acute combined systolic and diastolic heart failure (HCC)    Alcohol abuse with intoxication ([place]) 12/15/2020   Transient hypotension 123456   Acute metabolic encephalopathy 123456   AKI (acute kidney injury) ([place]) 12/15/2020   History of subarachnoid hemorrhage 12/15/2020   Hypoalbuminemia 12/15/2020   Atrial fibrillation ([place]) 12/15/2020   Bilateral lower extremity edema 12/15/2020   Fall at home, initial encounter 12/15/2020   Alcohol abuse 06/02/2018   Tobacco abuse 06/02/2018   Hypertriglyceridemia 01/19/2018   Essential hypertension 10/02/2017   Sciatica of right side 04/11/2017   Sleep disturbance 05/19/2015   Anxiety state 05/19/2015   Generalized headaches 04/18/2015   Gout 04/18/2015   Encounter for orogastric (OG) tube placement    Subarachnoid hemorrhage ([place]) 03/02/2015   Acute respiratory failure with hypoxia (HCC)    SAH (subarachnoid hemorrhage) (HCC)    Subarachnoid bleed (HCC)    Malignant hypertension     Current Outpatient Medications  Medication Sig Dispense Refill   ALPRAZolam (XANAX) 0.5 MG tablet TAKE 1 TABLET BY MOUTH THREE TIMES A DAY DNF 0907 90 tablet 0   apixaban (ELIQUIS) 5 MG TABS tablet Take 1 tablet (5 mg total) by mouth 2 (two) times daily. 180 tablet 1   furosemide (LASIX) 40 MG tablet Take 1 tablet (40 mg total) by mouth daily. 90 tablet 3   icosapent Ethyl (VASCEPA) 1 g capsule Take 2 capsules (2 g total) by mouth 2 (two) times daily. 360 capsule 2   losartan (COZAAR) 25 MG tablet Take 1 tablet (25 mg total) by mouth daily. 90 tablet 3   metoprolol succinate (TOPROL-XL) 50 MG 24 hr tablet Take 1 tablet (50 mg total) by mouth 2 (two) times daily. 90 tablet 2   pantoprazole (PROTONIX) 40 MG tablet Take 1 tablet (40 mg total) by mouth daily. 90 tablet 3   rosuvastatin (CRESTOR) 40 MG tablet  TAKE 1 TABLET BY MOUTH ONCE DAILY 90 tablet 3   thiamine (VITAMIN B-1) 100 MG tablet Take 100 mg by mouth daily.     allopurinol (ZYLOPRIM) 100 MG tablet Take 1 tablet (100 mg total) by mouth daily. 90 tablet 3   fenofibrate (TRICOR) 145 MG tablet Take 1 tablet (145 mg total) by mouth daily. 90 tablet 3   folic acid (FOLVITE) 1 MG tablet Take 1 tablet (1 mg total) by mouth daily. 90 tablet 3   No current facility-administered medications for this visit.    Allergies: Zetia [ezetimibe]  Past Medical History:  Diagnosis Date   Brain aneurysm    Chicken pox    Depression    Elevated LFTs    Gout    Hernia of abdominal cavity 11/2020   High cholesterol    Hypertension     Past Surgical History:  Procedure Laterality Date   CARDIOVERSION N/A 01/22/2021   Procedure: CARDIOVERSION;  Surgeon: Freada Bergeron, MD;  Location: [facility];  Service: Cardiovascular;  Laterality: N/A;   CRANIOTOMY Right 03/02/2015   Procedure: CRANIOTOMY INTRACRANIAL  ANEURYSM FOR CLIPPING;  Surgeon: Consuella Lose, MD;  Location: [facility];  Service: Neurosurgery;  Laterality: Right;   IR GENERIC HISTORICAL  02/16/2016   IR ANGIO VERTEBRAL SEL VERTEBRAL BILAT MOD SED 02/16/2016 Consuella Lose, MD [facility]   IR GENERIC HISTORICAL  02/16/2016   IR ANGIO INTRA EXTRACRAN SEL INTERNAL CAROTID  BILAT MOD SED 02/16/2016 Consuella Lose, MD [facility]   RADIOLOGY WITH ANESTHESIA N/A 03/02/2015   Procedure: RADIOLOGY WITH ANESTHESIA;  Surgeon: Consuella Lose, MD;  Location: [facility];  Service: Radiology;  Laterality: N/A;   RIGHT/LEFT HEART CATH AND CORONARY ANGIOGRAPHY N/A 12/19/2020   Procedure: RIGHT/LEFT HEART CATH AND CORONARY ANGIOGRAPHY;  Surgeon: Martinique, Peter M, MD;  Location: [facility];  Service: Cardiovascular;  Laterality: N/A;   WISDOM TOOTH EXTRACTION      Family History  Adopted: Yes    Social History   Tobacco Use   Smoking status: Every Day    Packs/day: 2.50    Years:  25.00    Total pack years: 62.50    Types: Cigarettes   Smokeless tobacco: Never  Substance Use Topics   Alcohol use: Yes    Alcohol/week: 70.0 standard drinks of alcohol    Types: 35 Cans of beer, 35 Shots of liquor per week    Subjective:   6 month follow up on chronic care needs; since last seen here, has been able to establish with GI and has follow up scheduled there for next month; cardiology has adjusted his blood pressure medications and improved;  Needs refills on Allopurinol, Fenofibrate and Folic Acid today;  Continues to use Xanax 0.5 mg tid- has been stable on this regimen x 5+ years and concern that could cause more complications to his health if attempt to change;   Does not want a flu shot today;     Objective:  Vitals:   06/21/22 1256  BP: 114/70  Pulse: 73  Temp: 97.8 F (36.6 C)  TempSrc: Oral  SpO2: 96%  Weight: 257 lb 6.4 oz (116.8 kg)  Height: 6\' 1"  (1.854 m)    General: Well developed, well nourished, in no acute distress  Skin : Warm and dry.  Head: Normocephalic and atraumatic  Lungs: Respirations unlabored; clear to auscultation bilaterally without wheeze, rales, rhonchi  CVS exam: normal rate and regular rhythm.  Vessels: Symmetric bilaterally  Neurologic: Alert and oriented; speech intact; face symmetrical; moves all extremities well; CNII-XII intact without focal deficit  Assessment:  1. Idiopathic gout, unspecified chronicity, unspecified site   2. Hyperlipidemia, unspecified hyperlipidemia type   3. Atrial fibrillation, unspecified type ([place])   4. Congestive heart failure, unspecified HF chronicity, unspecified heart failure type ([place])   5. Sleep disturbance   6. Gastroesophageal reflux disease, unspecified whether esophagitis present   7. Dysphagia, unspecified type     Plan:  Stable; refill updated; Stable; refill updated- does have follow up scheduled with his cardiologist next month; Continue follow up with cardiology; Improved  with adjustments made to medications- keep planned follow up with cardiology; Is planning to discuss home sleep study with cardiology at next [place]- apparently this has been recommended previously and agree this would be very beneficial for his health;  6 & 7. Keep planned follow up with GI- encouraged to use Protonix as prescribed/ may need dilatation;   No follow-ups on file.  No orders of the defined types were placed in this encounter.   Requested Prescriptions   Signed Prescriptions Disp Refills   allopurinol (ZYLOPRIM) 100 MG tablet 90 tablet 3    Sig: Take 1 tablet (100 mg total) by mouth daily.   fenofibrate (TRICOR) 145 MG tablet 90 tablet 3    Sig: Take 1 tablet (145 mg total) by mouth daily.   folic acid (FOLVITE) 1 MG tablet 90 tablet 3  Sig: Take 1 tablet (1 mg total) by mouth daily.

## 2022-07-01 ENCOUNTER — Other Ambulatory Visit (INDEPENDENT_AMBULATORY_CARE_PROVIDER_SITE_OTHER): Payer: Self-pay

## 2022-07-01 DIAGNOSIS — K921 Melena: Secondary | ICD-10-CM

## 2022-07-01 LAB — CBC WITH DIFFERENTIAL/PLATELET
Basophils Absolute: 0.1 10*3/uL (ref 0.0–0.1)
Basophils Relative: 1.4 % (ref 0.0–3.0)
Eosinophils Absolute: 0.2 10*3/uL (ref 0.0–0.7)
Eosinophils Relative: 3.5 % (ref 0.0–5.0)
HCT: 35.7 % — ABNORMAL LOW (ref 39.0–52.0)
Hemoglobin: 12 g/dL — ABNORMAL LOW (ref 13.0–17.0)
Lymphocytes Relative: 27.2 % (ref 12.0–46.0)
Lymphs Abs: 1.7 10*3/uL (ref 0.7–4.0)
MCHC: 33.5 g/dL (ref 30.0–36.0)
MCV: 103.4 fl — ABNORMAL HIGH (ref 78.0–100.0)
Monocytes Absolute: 0.3 10*3/uL (ref 0.1–1.0)
Monocytes Relative: 5.5 % (ref 3.0–12.0)
Neutro Abs: 3.8 10*3/uL (ref 1.4–7.7)
Neutrophils Relative %: 62.4 % (ref 43.0–77.0)
Platelets: 241 10*3/uL (ref 150.0–400.0)
RBC: 3.45 Mil/uL — ABNORMAL LOW (ref 4.22–5.81)
RDW: 16.5 % — ABNORMAL HIGH (ref 11.5–15.5)
WBC: 6.1 10*3/uL (ref 4.0–10.5)

## 2022-07-14 ENCOUNTER — Other Ambulatory Visit: Payer: Self-pay | Admitting: Family

## 2022-07-14 DIAGNOSIS — F411 Generalized anxiety disorder: Secondary | ICD-10-CM

## 2022-07-15 NOTE — Telephone Encounter (Signed)
Patient is requesting a refill of the following medications: Requested Prescriptions   Pending Prescriptions Disp Refills   ALPRAZolam (XANAX) 0.5 MG tablet [Pharmacy Med Name: ALPRAZOLAM 0.5 MG TABLET] 90 tablet 0    Sig: TAKE 1 TABLET BY MOUTH THREE TIMES A DAY    Last filled on 06/14/22 Last seen in office 06/21/22  Please advise on controlled rx refill

## 2022-07-19 ENCOUNTER — Other Ambulatory Visit (INDEPENDENT_AMBULATORY_CARE_PROVIDER_SITE_OTHER): Payer: Self-pay

## 2022-07-19 ENCOUNTER — Ambulatory Visit (INDEPENDENT_AMBULATORY_CARE_PROVIDER_SITE_OTHER): Payer: Self-pay | Admitting: Nurse Practitioner

## 2022-07-19 ENCOUNTER — Encounter: Payer: Self-pay | Admitting: Nurse Practitioner

## 2022-07-19 VITALS — BP 112/66 | HR 47 | Ht 73.0 in | Wt 252.2 lb

## 2022-07-19 DIAGNOSIS — K5909 Other constipation: Secondary | ICD-10-CM

## 2022-07-19 DIAGNOSIS — R7989 Other specified abnormal findings of blood chemistry: Secondary | ICD-10-CM

## 2022-07-19 DIAGNOSIS — D539 Nutritional anemia, unspecified: Secondary | ICD-10-CM

## 2022-07-19 LAB — VITAMIN B12: Vitamin B-12: 599 pg/mL (ref 211–911)

## 2022-07-19 LAB — CBC
HCT: 43.8 % (ref 39.0–52.0)
Hemoglobin: 14.8 g/dL (ref 13.0–17.0)
MCHC: 33.7 g/dL (ref 30.0–36.0)
MCV: 103.9 fl — ABNORMAL HIGH (ref 78.0–100.0)
Platelets: 261 10*3/uL (ref 150.0–400.0)
RBC: 4.21 Mil/uL — ABNORMAL LOW (ref 4.22–5.81)
RDW: 15.3 % (ref 11.5–15.5)
WBC: 9.6 10*3/uL (ref 4.0–10.5)

## 2022-07-19 LAB — FOLATE: Folate: >23.8 ng/mL (ref >5.9)

## 2022-07-19 NOTE — Progress Notes (Signed)
Chief Complaint:  follow up on constipation, elevated liver tests   Assessment &  Plan   # 56 yo male with multiple medical problems and new macrocytic anemia with black stools on Eliquis + Asa. Hgb declined from 15 >>11. Black stools resolved a month ago after discontinuation of asa and addition of PPI. Hgb has improved to 12.  He may have had NSAID related erosions or PUD which is resolving.  We discussed EGD. He is at increased risk for procedures, wants to hold off for now. Plan is to obtain iron studies, B12, folate and follow up CBC. If iron deficient then EGD and colonoscopy become more pressing.  Continue PPI, no NSAIDS     # Colon cancer screening.  He doesn't want a screening colonoscopy right now due to being self-pay. May need a diagnostic colonoscopy if iron deficient   # Chronic constipation. He hasn't started the Miralax recommended at our last visit. Currently taking Dulcolax about twice a week. Advised to start Miralax one capful in 8 oz water. Can continue to take Dulcolax a couple of times a week if needed.   # Elevated LFTs.  Pattern of AST / ALT elevation suggests Etoh related. We discussed that the typical evaluation of elevated liver enzymes includes labs to evaluate for autoimmune / genetic / metabolic and other etiologies. However, he is self-pay and would like to hold off for now.  Plan is to avoid alcohol for the next 8 weeks then repeat LFTs .  If still abnormal in the absence of alcohol then a complete hepatic serologic evaluation is strongly recommended.  HCV ab, HBsAg negative. No liver abnormalities on [place] in May 2023   HPI   Daniel Butler is a 56 y.o. male known to Dr. Henrene Pastor. He has multiple medical problems not limited to right MCA aneurysm and subarchnoid hemorrhage in 2016, PTSD, anxiety, depression, non-ischemic cardiomyopathy, chronic systolic heart failure, Afib with RVR, Etoh abuse, tobacco abuse, hyperlipidemia, HTN,  vertigo, gout. See PMH  /PSH for additional history   Daniel Butler was seen here on 06/10/2022 for evaluation of elevated LFTs.   His liver enzymes were elevated in a pattern suggesting Etoh. He was having problems with bloating and constipation. I recommended a trial of MiraLAX.  We talked again about a screening colonoscopy. He previously declined due to cost and still didn't want to proceed.  Furthermore he was at increased risk with multiple co-morbidities and had been having dizziness, hypotension and falls at home. In the office his sitting BP was only 70/40. His LFTs were repeated and again suggestive of Etoh . Advised to stop drinking and have repeat LFTs in 3 months.  In the interim he reported black stools.  He was on Eliquis and had recently started taking aspirin. CBC  on 10/6 showed a drop in hemoglobin from 15 to 11.  He was advised to stop NSAIDs, start pantoprazole, come for repeat labs later in the weeks and was given a follow-up appointment.  Hemoglobin on 07/01/2022 had improved to 12. MCV 103.   Interval History:  Constipation: He hasn't yet started the miralax. Trying to use up supply of dulcolax liquid.  He is taking 1 dose of Dulcolax about twice a week and averaging about 2 bowel movements a week. No blood in stool.  Elevated LFTs: Pattern of AST / ALT suggests this is Etoh related. Discussed other possibilities such as s. HCV ab and HBsAg negative.   Anemia, black stool:  No  black stools in about a month ( since stopping asa and starting PPI). Still taking daily Pantoprazole.    Previous GI Evaluation   Abdominal ultrasound in May 2023 did not show any abnormalities of the liver,  normal gallbladder.  CBD 2 mm  HCV ab , HBsAg negative in 2022    Labs:     Latest Ref Rng & Units 07/01/2022   12:13 PM 06/14/2022    9:56 AM 01/04/2022    2:17 PM  CBC  WBC 4.0 - 10.5 K/uL 6.1  10.7  9.8   Hemoglobin 13.0 - 17.0 g/dL 12.0  11.1  15.1   Hematocrit 39.0 - 52.0 % 35.7  32.9  44.6   Platelets 150.0  - 400.0 K/uL 241.0  255.0  264        Latest Ref Rng & Units 06/14/2022    9:56 AM 06/10/2022   12:20 PM 04/22/2022    9:58 AM  Hepatic Function  Total Protein 6.0 - 8.3 g/dL 6.4  7.0  6.2   Albumin 3.5 - 5.2 g/dL 3.4  3.6  3.7   AST 0 - 37 U/L 63  75  53   ALT 0 - 53 U/L 35  39  27   Alk Phosphatase 39 - 117 U/L _0 Total Bilirubin 0.2 - 1.2 mg/dL 0.9  0.9  0.9   Bilirubin, Direct 0.0 - 0.3 mg/dL 0.3  0.4       Past Medical History:  Diagnosis Date   Brain aneurysm    Chicken pox    Depression    Elevated LFTs    Gout    Hernia of abdominal cavity 11/2020   High cholesterol    Hypertension     Past Surgical History:  Procedure Laterality Date   CARDIOVERSION N/A 01/22/2021   Procedure: CARDIOVERSION;  Surgeon: Freada Bergeron, MD;  Location: [facility];  Service: Cardiovascular;  Laterality: N/A;   CRANIOTOMY Right 03/02/2015   Procedure: CRANIOTOMY INTRACRANIAL  ANEURYSM FOR CLIPPING;  Surgeon: Consuella Lose, MD;  [facility];  Service: Neurosurgery;  Laterality: Right;   IR GENERIC HISTORICAL  02/16/2016   IR ANGIO VERTEBRAL SEL VERTEBRAL BILAT MOD SED 02/16/2016 Consuella Lose, MD [facility]   IR GENERIC HISTORICAL  02/16/2016   IR ANGIO INTRA EXTRACRAN SEL INTERNAL CAROTID BILAT MOD SED 02/16/2016 Consuella Lose, MD [facility]   RADIOLOGY WITH ANESTHESIA N/A 03/02/2015   Procedure: RADIOLOGY WITH ANESTHESIA;  Surgeon: Consuella Lose, MD;  Location: [facility];  Service: Radiology;  Laterality: N/A;   RIGHT/LEFT HEART CATH AND CORONARY ANGIOGRAPHY N/A 12/19/2020   Procedure: RIGHT/LEFT HEART CATH AND CORONARY ANGIOGRAPHY;  Surgeon: Martinique, Peter M, MD;  Location: [facility];  Service: Cardiovascular;  Laterality: N/A;   WISDOM TOOTH EXTRACTION      Current Medications, Allergies, Family History and Social History were reviewed in [employer] record.     Current Outpatient Medications  Medication Sig  Dispense Refill   allopurinol (ZYLOPRIM) 100 MG tablet Take 1 tablet (100 mg total) by mouth daily. 90 tablet 3   ALPRAZolam (XANAX) 0.5 MG tablet TAKE 1 TABLET BY MOUTH THREE TIMES A DAY 90 tablet 0   apixaban (ELIQUIS) 5 MG TABS tablet Take 1 tablet (5 mg total) by mouth 2 (two) times daily. 180 tablet 1   fenofibrate (TRICOR) 145 MG tablet Take 1 tablet (145 mg total) by mouth daily. 90 tablet 3  folic acid (FOLVITE) 1 MG tablet Take 1 tablet (1 mg total) by mouth daily. 90 tablet 3   furosemide (LASIX) 40 MG tablet Take 1 tablet (40 mg total) by mouth daily. 90 tablet 3   icosapent Ethyl (VASCEPA) 1 g capsule Take 2 capsules (2 g total) by mouth 2 (two) times daily. 360 capsule 2   losartan (COZAAR) 25 MG tablet Take 1 tablet (25 mg total) by mouth daily. 90 tablet 3   metoprolol succinate (TOPROL-XL) 50 MG 24 hr tablet Take 1 tablet (50 mg total) by mouth 2 (two) times daily. 90 tablet 2   pantoprazole (PROTONIX) 40 MG tablet Take 1 tablet (40 mg total) by mouth daily. 90 tablet 3   rosuvastatin (CRESTOR) 40 MG tablet TAKE 1 TABLET BY MOUTH ONCE DAILY 90 tablet 3   thiamine (VITAMIN B-1) 100 MG tablet Take 100 mg by mouth daily.     No current facility-administered medications for this visit.    Review of Systems: No chest pain. No shortness of breath. No urinary complaints.    Physical Exam  Wt Readings from Last 3 Encounters:  07/19/22 252 lb 3.2 oz (114.4 kg)  06/21/22 257 lb 6.4 oz (116.8 kg)  06/10/22 258 lb (117 kg)    Ht _0  (1.854 m)   Wt 252 lb 3.2 oz (114.4 kg)   BMI 33.27 kg/m  Constitutional:  Generally well appearing male in no acute distress. In a wheelchair.  Psychiatric: Pleasant. Normal mood and affect. Behavior is normal. EENT: Pupils normal.  Conjunctivae are normal. No scleral icterus. Neck supple.  Cardiovascular: Normal rate, regular rhythm. No edema Pulmonary/chest: Effort normal and breath sounds normal. No wheezing, rales or rhonchi. Abdominal:  Limited exam in wheelchair. Abdomen soft, nondistended, nontender. Bowel sounds active throughout. There are no masses palpable. No hepatomegaly. Neurological: Alert and oriented to person place and time. Skin: Skin is warm and dry. No rashes noted.  Tye Savoy, NP  07/19/2022, 3:16 PM  Cc:  Marrian Salvage,*

## 2022-07-19 NOTE — Progress Notes (Signed)
Assessment and plan noted ?

## 2022-07-19 NOTE — Patient Instructions (Signed)
.  _______________________________________________________  If you are age 57 or older, your body mass index should be between 23-30. Your Body mass index is 33.27 kg/m. If this is out of the aforementioned range listed, please consider follow up with your Primary Care Provider.  If you are age 65 or younger, your body mass index should be between 19-25. Your Body mass index is 33.27 kg/m. If this is out of the aformentioned range listed, please consider follow up with your Primary Care Provider.   ________________________________________________________  The [facility] providers would like to encourage you to use [facility] to communicate with providers for non-urgent requests or questions.  Due to long hold times on the telephone, sending your provider a message by [facility] may be a faster and more efficient way to get a response.  Please allow 48 business hours for a response.  Please remember that this is for non-urgent requests.  _______________________________________________________   .Your provider has requested that you go to the basement level for lab work before leaving today. Press "B" on the elevator. The lab is located at the first door on the left as you exit the elevator.   Please come back  to lab in 8 weeks to have your liver function check.  Take miralax daily   Continue dulcolax as needed  Continue PPI  STOP DRINKING

## 2022-07-22 ENCOUNTER — Ambulatory Visit: Payer: Self-pay | Admitting: Internal Medicine

## 2022-07-24 ENCOUNTER — Encounter: Payer: Self-pay | Admitting: Internal Medicine

## 2022-07-24 DIAGNOSIS — E781 Pure hyperglyceridemia: Secondary | ICD-10-CM

## 2022-07-25 MED ORDER — ICOSAPENT ETHYL 1 G PO CAPS: 2.0000 g | ORAL_CAPSULE | Freq: Two times a day (BID) | ORAL | 2 refills | Status: DC

## 2022-07-25 NOTE — Telephone Encounter (Signed)
Called pt and gave pt recent lab results. See 11/10 lab result note.

## 2022-08-13 ENCOUNTER — Other Ambulatory Visit: Payer: Self-pay | Admitting: Family

## 2022-08-13 DIAGNOSIS — F411 Generalized anxiety disorder: Secondary | ICD-10-CM

## 2022-08-14 NOTE — Telephone Encounter (Signed)
Requesting: alprazolam 0.5mg   Contract: None UDS: None Last Visit: 06/21/22 Next Visit: None Last Refill: 07/16/22 #90 and 0RF   Please Advise

## 2022-09-11 ENCOUNTER — Telehealth: Payer: Self-pay

## 2022-09-11 NOTE — Telephone Encounter (Signed)
-----   Message from Marice Potter, RN sent at 06/11/2022  4:18 PM EDT ----- Pt needs repeat hepatic function panel. Lab order placed.

## 2022-09-11 NOTE — Telephone Encounter (Signed)
Spoke with pt and let him know about hepatic function panel. Pt verbalized understanding.

## 2022-09-15 ENCOUNTER — Other Ambulatory Visit: Payer: Self-pay | Admitting: Family

## 2022-09-15 DIAGNOSIS — F411 Generalized anxiety disorder: Secondary | ICD-10-CM

## 2022-09-16 NOTE — Telephone Encounter (Signed)
Requesting: Xanax Contract: N/A UDS: N/A Last Visit: 06/21/2022 Next Visit: N/A Last Refill:08/15/2022  Please Advise

## 2022-09-27 ENCOUNTER — Telehealth: Payer: Self-pay | Admitting: Internal Medicine

## 2022-09-27 NOTE — Telephone Encounter (Signed)
Pt c/o medication issue:  1. Name of Medication:   apixaban (ELIQUIS) 5 MG TABS tablet   2. How are you currently taking this medication (dosage and times per day)? As prescribed  3. Are you having a reaction (difficulty breathing--STAT)?   No  4. What is your medication issue?   Patient stated he has 30 days worth of this medication left.  Patient stated he has been in the [facility] Patient Assistance program is following up renewing his assistance with the program.

## 2022-09-27 NOTE — Telephone Encounter (Signed)
Patient stated he has 30 days left of eliquis 5mg  twice daily. He stated he will call [employer] to see about assistance and refills. Informed patient he has been approved for 1 sample box per PharmD. Lot #: [id number]; Exp: 6/25.

## 2022-10-14 ENCOUNTER — Other Ambulatory Visit: Payer: Self-pay | Admitting: Family

## 2022-10-14 DIAGNOSIS — F411 Generalized anxiety disorder: Secondary | ICD-10-CM

## 2022-11-01 ENCOUNTER — Ambulatory Visit: Payer: Self-pay | Admitting: Internal Medicine

## 2022-11-13 ENCOUNTER — Other Ambulatory Visit: Payer: Self-pay | Admitting: Family

## 2022-11-13 ENCOUNTER — Encounter: Payer: Self-pay | Admitting: Internal Medicine

## 2022-11-13 ENCOUNTER — Encounter: Payer: Self-pay | Admitting: Family

## 2022-11-13 DIAGNOSIS — F411 Generalized anxiety disorder: Secondary | ICD-10-CM

## 2022-11-14 ENCOUNTER — Telehealth: Payer: Self-pay | Admitting: Internal Medicine

## 2022-11-14 NOTE — Telephone Encounter (Signed)
Faxed Eliquis PAP to [employer] at [phone]

## 2022-11-14 NOTE — Telephone Encounter (Signed)
Requesting: alprazolam 0.'5mg'$   Contract: None UDS: None Last Visit: 06/21/22 Next Visit: None Last Refill: 10/15/22 #90 and 0RF   Please Advise

## 2022-11-29 ENCOUNTER — Telehealth: Payer: Self-pay | Admitting: Internal Medicine

## 2022-11-29 ENCOUNTER — Ambulatory Visit (HOSPITAL_COMMUNITY): Payer: Self-pay

## 2022-11-29 ENCOUNTER — Encounter (HOSPITAL_COMMUNITY): Payer: Self-pay

## 2022-11-29 NOTE — Telephone Encounter (Signed)
Per pt's wife message has been addressed and echo is scheduled for 4/19 ./cy

## 2022-11-29 NOTE — Telephone Encounter (Signed)
Wife is calling in to get patient echo reschedule, patient is sick. Echo expires 3/28 but was trying to get patient schedule from 4/19. Please advise

## 2022-12-13 ENCOUNTER — Encounter: Payer: Self-pay | Admitting: Family

## 2022-12-13 ENCOUNTER — Other Ambulatory Visit: Payer: Self-pay | Admitting: Family

## 2022-12-13 DIAGNOSIS — F411 Generalized anxiety disorder: Secondary | ICD-10-CM

## 2022-12-13 NOTE — Telephone Encounter (Signed)
Requesting: alprazolam 0.5mg   Contract: None UDS: None Last Visit: 06/21/22 Next Visit: None Last Refill: 11/14/22 #90 and 0RF   Please Advise

## 2022-12-20 ENCOUNTER — Ambulatory Visit (INDEPENDENT_AMBULATORY_CARE_PROVIDER_SITE_OTHER): Payer: Self-pay | Admitting: Family

## 2022-12-20 VITALS — BP 102/62 | HR 60 | Resp 19 | Ht 73.0 in | Wt 246.0 lb

## 2022-12-20 DIAGNOSIS — Z720 Tobacco use: Secondary | ICD-10-CM

## 2022-12-20 DIAGNOSIS — Z5181 Encounter for therapeutic drug level monitoring: Secondary | ICD-10-CM

## 2022-12-20 DIAGNOSIS — F411 Generalized anxiety disorder: Secondary | ICD-10-CM

## 2022-12-20 DIAGNOSIS — G4700 Insomnia, unspecified: Secondary | ICD-10-CM

## 2022-12-20 MED ORDER — TRAZODONE HCL 50 MG PO TABS: 25.0000 mg | ORAL_TABLET | Freq: Every evening | ORAL | 1 refills | Status: DC | PRN

## 2022-12-20 NOTE — Progress Notes (Signed)
Daniel Butler is a 57 y.o. male with the following history as recorded in EpicCare:  Patient Active Problem List   Diagnosis Date Noted   Acute combined systolic and diastolic heart failure    Alcohol abuse with intoxication 12/15/2020   Transient hypotension 12/15/2020   Acute metabolic encephalopathy 12/15/2020   AKI (acute kidney injury) 12/15/2020   History of subarachnoid hemorrhage 12/15/2020   Hypoalbuminemia 12/15/2020   Atrial fibrillation 12/15/2020   Bilateral lower extremity edema 12/15/2020   Fall at home, initial encounter 12/15/2020   Alcohol abuse 06/02/2018   Tobacco abuse 06/02/2018   Hypertriglyceridemia 01/19/2018   Essential hypertension 10/02/2017   Sciatica of right side 04/11/2017   Sleep disturbance 05/19/2015   Anxiety state 05/19/2015   Generalized headaches 04/18/2015   Gout 04/18/2015   Encounter for orogastric (OG) tube placement    Subarachnoid hemorrhage 03/02/2015   Acute respiratory failure with hypoxia    SAH (subarachnoid hemorrhage)    Subarachnoid bleed    Malignant hypertension     Current Outpatient Medications  Medication Sig Dispense Refill   allopurinol (ZYLOPRIM) 100 MG tablet Take 1 tablet (100 mg total) by mouth daily. 90 tablet 3   ALPRAZolam (XANAX) 0.5 MG tablet Take 1 tablet (0.5 mg total) by mouth 3 (three) times daily as needed for anxiety. Do not fill before 12/16/2022 90 tablet 0   apixaban (ELIQUIS) 5 MG TABS tablet Take 1 tablet (5 mg total) by mouth 2 (two) times daily. 180 tablet 1   fenofibrate (TRICOR) 145 MG tablet Take 1 tablet (145 mg total) by mouth daily. 90 tablet 3   folic acid (FOLVITE) 1 MG tablet Take 1 tablet (1 mg total) by mouth daily. 90 tablet 3   furosemide (LASIX) 40 MG tablet Take 1 tablet (40 mg total) by mouth daily. 90 tablet 3   icosapent Ethyl (VASCEPA) 1 g capsule Take 2 capsules (2 g total) by mouth 2 (two) times daily. 360 capsule 2   losartan (COZAAR) 25 MG tablet Take 1 tablet (25 mg  total) by mouth daily. 90 tablet 3   metoprolol succinate (TOPROL-XL) 50 MG 24 hr tablet Take 1 tablet (50 mg total) by mouth 2 (two) times daily. 90 tablet 2   pantoprazole (PROTONIX) 40 MG tablet Take 1 tablet (40 mg total) by mouth daily. 90 tablet 3   rosuvastatin (CRESTOR) 40 MG tablet TAKE 1 TABLET BY MOUTH ONCE DAILY 90 tablet 3   thiamine (VITAMIN B-1) 100 MG tablet Take 100 mg by mouth daily.     traZODone (DESYREL) 50 MG tablet Take 0.5-1 tablets (25-50 mg total) by mouth at bedtime as needed for sleep. 30 tablet 1   No current facility-administered medications for this visit.    Allergies: Zetia [ezetimibe]  Past Medical History:  Diagnosis Date   Brain aneurysm    Chicken pox    Depression    Elevated LFTs    Gout    Hernia of abdominal cavity 11/2020   High cholesterol    Hypertension     Past Surgical History:  Procedure Laterality Date   CARDIOVERSION N/A 01/22/2021   Procedure: CARDIOVERSION;  Surgeon: Meriam Sprague, MD;  Location: [facility] ENDOSCOPY;  Service: Cardiovascular;  Laterality: N/A;   CRANIOTOMY Right 03/02/2015   Procedure: CRANIOTOMY INTRACRANIAL  ANEURYSM FOR CLIPPING;  Surgeon: Lisbeth Renshaw, MD;  Location: [facility] NEURO ORS;  Service: Neurosurgery;  Laterality: Right;   IR GENERIC HISTORICAL  02/16/2016   IR ANGIO VERTEBRAL SEL  VERTEBRAL BILAT MOD SED 02/16/2016 Lisbeth Renshaw, MD [facility]   IR GENERIC HISTORICAL  02/16/2016   IR ANGIO INTRA EXTRACRAN SEL INTERNAL CAROTID BILAT MOD SED 02/16/2016 Lisbeth Renshaw, MD [facility]   RADIOLOGY WITH ANESTHESIA N/A 03/02/2015   Procedure: RADIOLOGY WITH ANESTHESIA;  Surgeon: Lisbeth Renshaw, MD;  Location: [facility] OR;  Service: Radiology;  Laterality: N/A;   RIGHT/LEFT HEART CATH AND CORONARY ANGIOGRAPHY N/A 12/19/2020   Procedure: RIGHT/LEFT HEART CATH AND CORONARY ANGIOGRAPHY;  Surgeon: Swaziland, Peter M, MD;  Location: [facility] INVASIVE CV LAB;  Service: Cardiovascular;  Laterality: N/A;   WISDOM TOOTH EXTRACTION       Family History  Adopted: Yes    Social History   Tobacco Use   Smoking status: Every Day    Packs/day: 2.50    Years: 25.00    Additional pack years: 0.00    Total pack years: 62.50    Types: Cigarettes   Smokeless tobacco: Never  Substance Use Topics   Alcohol use: Yes    Alcohol/week: 70.0 standard drinks of alcohol    Types: 35 Cans of beer, 35 Shots of liquor per week    Subjective:   6 month follow up on medications; does need to get contract and drug screen up to date for continued use of Xanax; wife is present for office visit today;  Would like to try option for chronic insomnia-  Objective:  Vitals:   12/20/22 1439  BP: 102/62  Pulse: 60  Resp: 19  SpO2: 99%  Weight: 246 lb (111.6 kg)  Height: 6\' 1"  (1.854 m)    General: Well developed, well nourished, in no acute distress  Skin : Warm and dry.  Head: Normocephalic and atraumatic  Eyes: Sclera and conjunctiva clear; pupils round and reactive to light; extraocular movements intact  Ears: External normal; canals clear; tympanic membranes normal  Oropharynx: Pink, supple. No suspicious lesions  Neck: Supple without thyromegaly, adenopathy  Lungs: Respirations unlabored; clear to auscultation bilaterally without wheeze, rales, rhonchi  CVS exam: normal rate and regular rhythm.  Neurologic: Alert and oriented; speech intact; face symmetrical; uses rolling walker;  Assessment:  1. Tobacco abuse   2. Therapeutic drug monitoring   3. Anxiety state   4. Insomnia, unspecified type     Plan:  Refer for lung cancer screening; Drug contract and urine drug screen updated today;  Stable; continue Xanax 0.5 mg tid prn;  Rx for Trazodone 50 mg daily;   No follow-ups on file.  Orders Placed This Encounter  Procedures   Drug Monitoring Panel [phone], Urine   Ambulatory Referral Lung Cancer Screening [facility]    Referral Priority:   Routine    Referral Type:   Consultation    Referral Reason:    Specialty Services Required    Number of Visits Requested:   1    Requested Prescriptions   Signed Prescriptions Disp Refills   traZODone (DESYREL) 50 MG tablet 30 tablet 1    Sig: Take 0.5-1 tablets (25-50 mg total) by mouth at bedtime as needed for sleep.

## 2022-12-22 ENCOUNTER — Encounter: Payer: Self-pay | Admitting: Family

## 2022-12-22 LAB — DRUG MONITORING PANEL 375977 , URINE
Alcohol Metabolites: POSITIVE ng/mL — AB (ref <500)
Alphahydroxyalprazolam: 1256 ng/mL — ABNORMAL HIGH (ref <25)
Alphahydroxymidazolam: NEGATIVE ng/mL (ref <50)
Alphahydroxytriazolam: NEGATIVE ng/mL (ref <50)
Aminoclonazepam: NEGATIVE ng/mL (ref <25)
Amphetamines: NEGATIVE ng/mL (ref <500)
Barbiturates: NEGATIVE ng/mL (ref <300)
Benzodiazepines: POSITIVE ng/mL — AB (ref <100)
Cocaine Metabolite: NEGATIVE ng/mL (ref <150)
Desmethyltramadol: NEGATIVE ng/mL (ref <100)
Ethyl Glucuronide (ETG): >10000 ng/mL — ABNORMAL HIGH (ref <500)
Ethyl Sulfate (ETS): >10000 ng/mL — ABNORMAL HIGH (ref <100)
Hydroxyethylflurazepam: NEGATIVE ng/mL (ref <50)
Lorazepam: NEGATIVE ng/mL (ref <50)
Marijuana Metabolite: NEGATIVE ng/mL (ref <20)
Nordiazepam: NEGATIVE ng/mL (ref <50)
Opiates: NEGATIVE ng/mL (ref <100)
Oxazepam: NEGATIVE ng/mL (ref <50)
Oxycodone: NEGATIVE ng/mL (ref <100)
Temazepam: NEGATIVE ng/mL (ref <50)
Tramadol: NEGATIVE ng/mL (ref <100)

## 2022-12-22 LAB — DM TEMPLATE

## 2022-12-27 ENCOUNTER — Ambulatory Visit (HOSPITAL_COMMUNITY): Payer: Self-pay

## 2023-01-06 ENCOUNTER — Ambulatory Visit (HOSPITAL_COMMUNITY)
Admission: RE | Admit: 2023-01-06 | Discharge: 2023-01-06 | Disposition: A | Discharge status: Home / Self Care | Payer: Self-pay | Source: Ambulatory Visit | Attending: General Practice | Admitting: General Practice

## 2023-01-06 ENCOUNTER — Other Ambulatory Visit: Payer: Self-pay

## 2023-01-06 DIAGNOSIS — I4891 Unspecified atrial fibrillation: Secondary | ICD-10-CM | POA: Insufficient documentation

## 2023-01-06 DIAGNOSIS — I110 Hypertensive heart disease with heart failure: Secondary | ICD-10-CM | POA: Insufficient documentation

## 2023-01-06 DIAGNOSIS — I5042 Chronic combined systolic (congestive) and diastolic (congestive) heart failure: Secondary | ICD-10-CM | POA: Insufficient documentation

## 2023-01-06 DIAGNOSIS — I5041 Acute combined systolic (congestive) and diastolic (congestive) heart failure: Secondary | ICD-10-CM

## 2023-01-06 DIAGNOSIS — N179 Acute kidney failure, unspecified: Secondary | ICD-10-CM | POA: Insufficient documentation

## 2023-01-06 LAB — ECHOCARDIOGRAM COMPLETE
Calc EF: 59.1 %
S' Lateral: 3.6 cm
Single Plane A2C EF: 57 %
Single Plane A4C EF: 60.1 %

## 2023-01-06 MED ORDER — APIXABAN 5 MG PO TABS
5.0000 mg | ORAL_TABLET | Freq: Two times a day (BID) | ORAL | 1 refills | Status: DC
Start: 2023-01-06 — End: 2023-01-10

## 2023-01-06 NOTE — Progress Notes (Signed)
Echocardiogram 2D Echocardiogram has been performed.  Augustine Radar 01/06/2023, 2:41 PM

## 2023-01-06 NOTE — Telephone Encounter (Signed)
Prescription refill request for Eliquis received. Indication: a fib Last office visit: 04/22/22 Scr: 1.27 04/22/22 epic Age: 57 Weight: 246

## 2023-01-08 ENCOUNTER — Other Ambulatory Visit: Payer: Self-pay

## 2023-01-08 DIAGNOSIS — I4891 Unspecified atrial fibrillation: Secondary | ICD-10-CM

## 2023-01-08 NOTE — Telephone Encounter (Signed)
Eliquis 5mg  refill request received. Patient is 57 years old, weight-111.6kg, Crea-1.27 on 04/22/22, Diagnosis-Afib, and last seen by [name] on 04/22/22. Dose is appropriate based on dosing criteria.  Last refill sent on 01/06/23 and verified with Annice Pih at the Pharmacy it had been received. Will deny duplicate request

## 2023-01-10 ENCOUNTER — Other Ambulatory Visit: Payer: Self-pay

## 2023-01-10 DIAGNOSIS — I4891 Unspecified atrial fibrillation: Secondary | ICD-10-CM

## 2023-01-10 MED ORDER — APIXABAN 5 MG PO TABS
5.0000 mg | ORAL_TABLET | Freq: Two times a day (BID) | ORAL | 1 refills | Status: DC
Start: 2023-01-10 — End: 2023-11-11

## 2023-01-10 NOTE — Telephone Encounter (Signed)
Prescription refill request for Eliquis received. Indication: Afib  Last office visit: 04/22/22 ([place])  Scr: 1.27 (04/22/22)  Age: 57 Weight: 111.6kg  Appropriate dose. Refill sent.

## 2023-01-13 ENCOUNTER — Other Ambulatory Visit: Payer: Self-pay | Admitting: Family

## 2023-01-13 DIAGNOSIS — F411 Generalized anxiety disorder: Secondary | ICD-10-CM

## 2023-01-15 NOTE — Telephone Encounter (Signed)
Patient approved 02/13/23 -- 02/12/24

## 2023-02-05 ENCOUNTER — Other Ambulatory Visit: Payer: Self-pay | Admitting: Internal Medicine

## 2023-02-12 ENCOUNTER — Other Ambulatory Visit: Payer: Self-pay | Admitting: Family

## 2023-02-12 ENCOUNTER — Other Ambulatory Visit: Payer: Self-pay | Admitting: General Practice

## 2023-02-13 ENCOUNTER — Other Ambulatory Visit: Payer: Self-pay | Admitting: Family

## 2023-02-13 DIAGNOSIS — F411 Generalized anxiety disorder: Secondary | ICD-10-CM

## 2023-02-28 ENCOUNTER — Ambulatory Visit: Payer: Self-pay | Admitting: Internal Medicine

## 2023-03-16 ENCOUNTER — Other Ambulatory Visit: Payer: Self-pay | Admitting: Family

## 2023-03-16 DIAGNOSIS — F411 Generalized anxiety disorder: Secondary | ICD-10-CM

## 2023-03-17 NOTE — Telephone Encounter (Signed)
Requesting: Xanax Contract: 12/20/2022 UDS: 12/20/2022 Last Visit: 12/20/2022 Next Visit: N/A Last Refill: 02/13/2023  Please Advise

## 2023-03-20 ENCOUNTER — Other Ambulatory Visit: Payer: Self-pay | Admitting: General Practice

## 2023-03-24 ENCOUNTER — Other Ambulatory Visit: Payer: Self-pay

## 2023-03-24 DIAGNOSIS — E781 Pure hyperglyceridemia: Secondary | ICD-10-CM

## 2023-03-24 MED ORDER — ICOSAPENT ETHYL 1 G PO CAPS
2.0000 g | ORAL_CAPSULE | Freq: Two times a day (BID) | ORAL | 0 refills | Status: DC
Start: 2023-03-24 — End: 2023-03-25

## 2023-03-25 ENCOUNTER — Other Ambulatory Visit: Payer: Self-pay | Admitting: Internal Medicine

## 2023-03-25 ENCOUNTER — Other Ambulatory Visit: Payer: Self-pay | Admitting: *Deleted

## 2023-03-25 DIAGNOSIS — E781 Pure hyperglyceridemia: Secondary | ICD-10-CM

## 2023-03-25 MED ORDER — ICOSAPENT ETHYL 1 G PO CAPS
2.0000 g | ORAL_CAPSULE | Freq: Two times a day (BID) | ORAL | 1 refills | Status: DC
Start: 2023-03-25 — End: 2023-11-11

## 2023-03-31 ENCOUNTER — Other Ambulatory Visit: Payer: Self-pay

## 2023-04-11 ENCOUNTER — Encounter: Payer: Self-pay | Admitting: Internal Medicine

## 2023-04-11 ENCOUNTER — Encounter: Payer: Self-pay | Admitting: Family

## 2023-04-12 ENCOUNTER — Other Ambulatory Visit: Payer: Self-pay | Admitting: Family

## 2023-04-14 ENCOUNTER — Telehealth: Payer: Self-pay | Admitting: *Deleted

## 2023-04-15 ENCOUNTER — Encounter: Payer: Self-pay | Admitting: *Deleted

## 2023-04-15 ENCOUNTER — Other Ambulatory Visit: Payer: Self-pay | Admitting: Family

## 2023-04-15 DIAGNOSIS — F411 Generalized anxiety disorder: Secondary | ICD-10-CM

## 2023-04-16 NOTE — Telephone Encounter (Signed)
Requesting: Xanax  Contract: 12/20/2022 UDS: 12/20/2022 Last Visit: 12/20/2022 Next Visit: N/A Last Refill: 03/18/2023   Please Advise

## 2023-05-10 ENCOUNTER — Other Ambulatory Visit: Payer: Self-pay | Admitting: Internal Medicine

## 2023-05-12 ENCOUNTER — Other Ambulatory Visit: Payer: Self-pay | Admitting: Family

## 2023-05-15 ENCOUNTER — Other Ambulatory Visit: Payer: Self-pay | Admitting: Family

## 2023-05-15 DIAGNOSIS — F411 Generalized anxiety disorder: Secondary | ICD-10-CM

## 2023-06-15 ENCOUNTER — Other Ambulatory Visit: Payer: Self-pay | Admitting: Family

## 2023-06-15 ENCOUNTER — Other Ambulatory Visit: Payer: Self-pay | Admitting: Nurse Practitioner

## 2023-06-15 DIAGNOSIS — M1000 Idiopathic gout, unspecified site: Secondary | ICD-10-CM

## 2023-06-15 DIAGNOSIS — F411 Generalized anxiety disorder: Secondary | ICD-10-CM

## 2023-06-16 NOTE — Telephone Encounter (Signed)
Requesting: alprazolam 0.5mg   Contract:  12/25/22 UDS: 12/20/22 Last Visit: 12/20/22 Next Visit: None Last Refill: 05/16/23 #90 and 0RF   Please Advise

## 2023-06-19 ENCOUNTER — Other Ambulatory Visit: Payer: Self-pay | Admitting: Internal Medicine

## 2023-07-15 ENCOUNTER — Other Ambulatory Visit: Payer: Self-pay | Admitting: Family

## 2023-07-15 ENCOUNTER — Other Ambulatory Visit: Payer: Self-pay | Admitting: Nurse Practitioner

## 2023-07-15 DIAGNOSIS — F411 Generalized anxiety disorder: Secondary | ICD-10-CM

## 2023-07-16 ENCOUNTER — Telehealth: Payer: Self-pay | Admitting: Family

## 2023-07-16 NOTE — Telephone Encounter (Signed)
Prescription Request  07/16/2023  Is this a "Controlled Substance" medicine? No  LOV: Visit date not found  What is the name of the medication or equipment? ALPRAZolam (XANAX) 0.5 MG tablet   Have you contacted your pharmacy to request a refill? Yes    [id number] [place], [place] - [address] AT [facility] [address] [place] [address] Phone: [phone] Fax: [phone]   Patient notified that their request is being sent to the clinical staff for review and that they should receive a response within 2 business days.   Please advise at [facility] [phone]

## 2023-07-18 ENCOUNTER — Ambulatory Visit: Payer: Self-pay | Attending: Internal Medicine | Admitting: Internal Medicine

## 2023-07-18 ENCOUNTER — Encounter: Payer: Self-pay | Admitting: Internal Medicine

## 2023-07-18 VITALS — BP 68/48 | HR 101

## 2023-07-18 DIAGNOSIS — I959 Hypotension, unspecified: Secondary | ICD-10-CM

## 2023-07-18 DIAGNOSIS — I4891 Unspecified atrial fibrillation: Secondary | ICD-10-CM

## 2023-07-18 DIAGNOSIS — I10 Essential (primary) hypertension: Secondary | ICD-10-CM

## 2023-07-18 DIAGNOSIS — I5041 Acute combined systolic (congestive) and diastolic (congestive) heart failure: Secondary | ICD-10-CM

## 2023-07-18 DIAGNOSIS — F1010 Alcohol abuse, uncomplicated: Secondary | ICD-10-CM

## 2023-07-18 NOTE — Patient Instructions (Signed)
Medication Instructions:  NO CHANGES  *If you need a refill on your cardiac medications before your next appointment, please call your pharmacy*  Follow-Up: At [facility], you and your health needs are our priority.  As part of our continuing mission to provide you with exceptional heart care, we have created designated Provider Care Teams.  These Care Teams include your primary Cardiologist (physician) and Advanced Practice Providers (APPs -  Physician Assistants and Nurse Practitioners) who all work together to provide you with the care you need, when you need it.  We recommend signing up for the patient portal called "MyChart".  Sign up information is provided on this After Visit Summary.  MyChart is used to connect with patients for Virtual Visits (Telemedicine).  Patients are able to view lab/test results, encounter notes, upcoming appointments, etc.  Non-urgent messages can be sent to your provider as well.   To learn more about what you can do with MyChart, go to [email or web address].    Your next appointment:   3 months with Edd Fabian NP

## 2023-07-18 NOTE — Progress Notes (Signed)
OFFICE CONSULT NOTE  Chief Complaint:  Falls, weakness  Primary Care Physician: Olive Bass, FNP  HPI:  Daniel Butler is a 57 y.o. male who is being seen today for the evaluation of high triglycerides at the request of Carley Hammed, NP. This is a pleasant 57 year old male was kindly referred for evaluation of high triglycerides.  He has a long history of elevated cholesterol and primarily elevated triglycerides.  Unfortunately suffered a brain aneurysm several years ago.  He did survive however he struggled with depression and anxiety.  He tells me he has not worked since then but fortunately he says just prior to that he claims he won the lottery and says that he still has over $1 million.  Recently he said his wife left him because of heavy alcohol use.  He had previously drank more than 30 beers a week and typically drinks 6-12 beers per night.  Generally eats very little and seems to have poor nutrition.  He also seems to be somewhat unkempt today in the office.  He reports significant symptoms concerning for PTSD and shies away from driving or getting out of his house much.  He did say that his wife would be returning next month and he said that may help him "get on track".  Any chest pain or worsening shortness of breath.  He has no history of known coronary artery disease.  He reports being adopted and does not know his family history.  09/30/2018  Daniel Butler returns to follow-up on his dyslipidemia.  Now seems to be tolerating Vascepa and has had a reduction in triglycerides from 585-378.  We did a direct LDL which is markedly elevated 148.  Unfortunately his alcohol use continues.  We discussed how important would be to significantly reduce that and seek treatment and provided treatment information for him.  He is accompanied by his spouse today.  04/22/2022  Daniel Butler is seen today in follow-up.  He is recently been followed by our nurse practitioners.   In April 2022 he underwent right and left heart catheterization for heart failure.  LVEF at that time was 35 to 40%.  He was started on guideline directed medical therapy including Entresto and Jardiance but had issues with dizziness, hypotension and dehydration.  He was then taken off of the medications but has not been on a diuretic.  He was using alcohol more heavily but has recently decreased that.  He says he is now down to about 1-2 beers a day.  He reports medication compliance.  He has not had any recent labs.  He did have an echo in March of this year which showed his EF had improved slightly to 40 to 45%.  He was also noted to have a dilated ascending aorta, measuring 43 mm.  A repeat echo was recommended in 1 year.  Over the past several weeks he says he has become more short of breath.  He is gained about 8 pounds.  He feels more tightness in his abdomen.  He has had some lower extremity swelling.  He reports he is actually eating less than he has previously.  He has also reported recent shortness of breath at night and awakening gasping for breath.  He has had noted snoring as well as nonrestorative sleep.  This is concerning for obstructive sleep apnea.  07/18/2023  Daniel Butler returns today for follow-up.  He has been having falls over the past week, including earlier this morning.  He has also noted black stools.  Blood pressure today was very low in the office at 68/48.  He was difficult to obtain.  He has felt dizziness and weakness.  He had subsequently cut back a lot of his heart failure medications.  He is currently only on metoprolol succinate 50 mg daily.  EKG today shows he is in A-fib with RVR.  He appears pale and ashen and I am quite concerned about his health.  I had a long discussion about this today with him and his significant other.  She feels that he should be in the hospital and I would agree with that.  However he has declined that evaluation.  Additionally, I was asked to  provide some disability paperwork.  PMHx:  Past Medical History:  Diagnosis Date   Brain aneurysm    Chicken pox    Depression    Elevated LFTs    Gout    Hernia of abdominal cavity 11/2020   High cholesterol    Hypertension     Past Surgical History:  Procedure Laterality Date   CARDIOVERSION N/A 01/22/2021   Procedure: CARDIOVERSION;  Surgeon: Meriam Sprague, MD;  Location: [facility] ENDOSCOPY;  Service: Cardiovascular;  Laterality: N/A;   CRANIOTOMY Right 03/02/2015   Procedure: CRANIOTOMY INTRACRANIAL  ANEURYSM FOR CLIPPING;  Surgeon: Lisbeth Renshaw, MD;  Location: [facility]S;  Service: Neurosurgery;  Laterality: Right;   IR GENERIC HISTORICAL  02/16/2016   IR ANGIO VERTEBRAL SEL VERTEBRAL BILAT MOD SED 02/16/2016 Lisbeth Renshaw, MD [facility]   IR GENERIC HISTORICAL  02/16/2016   IR ANGIO INTRA EXTRACRAN SEL INTERNAL CAROTID BILAT MOD SED 02/16/2016 Lisbeth Renshaw, MD [facility]   RADIOLOGY WITH ANESTHESIA N/A 03/02/2015   Procedure: RADIOLOGY WITH ANESTHESIA;  Surgeon: Lisbeth Renshaw, MD;  Location: [facility] OR;  Service: Radiology;  Laterality: N/A;   RIGHT/LEFT HEART CATH AND CORONARY ANGIOGRAPHY N/A 12/19/2020   Procedure: RIGHT/LEFT HEART CATH AND CORONARY ANGIOGRAPHY;  Surgeon: Swaziland, Peter M, MD;  Location: [facility] INVASIVE CV LAB;  Service: Cardiovascular;  Laterality: N/A;   WISDOM TOOTH EXTRACTION      FAMHx:  Family History  Adopted: Yes    SOCHx:   reports that he has been smoking cigarettes. He has a 62.5 pack-year smoking history. He has never used smokeless tobacco. He reports current alcohol use of about 70.0 standard drinks of alcohol per week. He reports that he does not use drugs.  ALLERGIES:  Allergies  Allergen Reactions   Zetia [Ezetimibe] Nausea And Vomiting    ROS: Pertinent items noted in HPI and remainder of comprehensive ROS otherwise negative.  HOME MEDS: Current Outpatient Medications on File Prior to Visit  Medication Sig Dispense  Refill   allopurinol (ZYLOPRIM) 100 MG tablet Take 1 tablet (100 mg total) by mouth daily. 90 tablet 0   ALPRAZolam (XANAX) 0.5 MG tablet TAKE 1 TABLET BY MOUTH THREE TIMES A DAY AS NEEDED FOR ANXIETY 90 tablet 0   apixaban (ELIQUIS) 5 MG TABS tablet Take 1 tablet (5 mg total) by mouth 2 (two) times daily. 180 tablet 1   fenofibrate (TRICOR) 145 MG tablet Take 1 tablet (145 mg total) by mouth daily. 90 tablet 3   folic acid (FOLVITE) 1 MG tablet Take 1 tablet (1 mg total) by mouth daily. 90 tablet 3   furosemide (LASIX) 40 MG tablet Take 1 tablet by mouth once daily 90 tablet 0   icosapent Ethyl (VASCEPA) 1 g capsule Take 2 capsules (2 g  total) by mouth 2 (two) times daily. 360 capsule 1   losartan (COZAAR) 25 MG tablet Take 1 tablet (25 mg total) by mouth daily. 90 tablet 3   metoprolol succinate (TOPROL-XL) 50 MG 24 hr tablet Take 1 tablet by mouth twice daily 60 tablet 0   pantoprazole (PROTONIX) 40 MG tablet Take 1 tablet (40 mg total) by mouth daily. Patient needs follow up appointment for future refills. Please call [phone] to schedule an appointment. 30 tablet 0   rosuvastatin (CRESTOR) 40 MG tablet Take 1 tablet (40 mg total) by mouth daily. Please keep scheduled appointment for future refills. Thank you. 30 tablet 0   thiamine (VITAMIN B-1) 100 MG tablet Take 100 mg by mouth daily.     traZODone (DESYREL) 50 MG tablet TAKE 1/2 TO 1 (ONE-HALF TO ONE) TABLET BY MOUTH AT BEDTIME AS NEEDED FOR SLEEP 30 tablet 0   No current facility-administered medications on file prior to visit.    LABS/IMAGING: No results found for this or any previous visit (from the past 48 hour(s)). No results found.  LIPID PANEL:    Component Value Date/Time   CHOL 93 (L) 04/22/2022 0958   TRIG 118 04/22/2022 0958   HDL 31 (L) 04/22/2022 0958   CHOLHDL 3.0 04/22/2022 0958   CHOLHDL 4 02/22/2019 1444   VLDL 53.8 (H) 02/22/2019 1444   LDLCALC 40 04/22/2022 0958   LDLDIRECT 48 10/20/2019 1212    LDLDIRECT 58.0 02/22/2019 1444    WEIGHTS: Wt Readings from Last 3 Encounters:  12/20/22 246 lb (111.6 kg)  07/19/22 252 lb 3.2 oz (114.4 kg)  06/21/22 257 lb 6.4 oz (116.8 kg)    VITALS: BP (!) 68/48 (BP Location: Right Arm, Patient Position: Sitting, Cuff Size: Normal)   Pulse (!) 101   SpO2 98%   EXAM: General appearance: alert, no distress, and moderately obese Neck: JVD - 7 cm above sternal notch, no carotid bruit, and thyroid not enlarged, symmetric, no tenderness/mass/nodules Lungs: diminished breath sounds bibasilar Heart: irregularly irregular rhythm and tachycardic Abdomen: Obese, protuberant, no fluid wave Extremities: edema 1+ bilateral pitting Pulses: 2+ and symmetric Skin: Skin color, texture, turgor normal. No rashes or lesions Neurologic: Grossly normal Deferred  EKG: EKG Interpretation Date/Time:  Friday July 18 2023 15:18:41 EST Ventricular Rate:  115 PR Interval:    QRS Duration:  78 QT Interval:  330 QTC Calculation: 456 R Axis:   39  Text Interpretation: Atrial fibrillation with rapid ventricular response ST & T wave abnormality, consider inferior ischemia ST & T wave abnormality, consider anterolateral ischemia When compared with ECG of 22-Jan-2021 09:37, Non-specific change in ST segment in Inferior leads ST now depressed in Anterolateral leads T wave inversion more evident in Inferior leads T wave inversion now evident in Anterolateral leads Confirmed by Zoila Shutter [phone]) on 07/18/2023 3:35:26 PM    ASSESSMENT: Severe hypotension Black stools-?  GI bleed Acute on chronic systolic congestive heart failure, LVEF 40 to 45%, NYHA class II-III symptoms Nonischemic cardiomyopathy Secondary hypertriglyceridemia History of heavy etoh abuse Tobacco abuse Anxiety/depression - ?PTSD At risk for obstructive sleep apnea -stop-bang score of 8  PLAN: 1.   Daniel Butler is severely hypotensive today.  He reports he is continues to alcohol but he  says it is 1/10 of what he had been drinking last year.  He notes that he has been having some black stools but no overt bleeding.  He appears pale and likely anemic on exam.  Blood pressure was difficult  to obtain today.  I suspect he is dehydrated and would benefit from emergency department evaluation.  I had a long discussion with him and his significant other about this however he has declined EMS or personal vehicle transportation to the emergency department.  He understands that he may be at risk for further decompensation or death with this decision.  We will arrange for close follow-up in 3 months.  Chrystie Nose, MD, [facility], FACP    [facility]  Medical Director of the Advanced Lipid Disorders &  Cardiovascular Risk Reduction Clinic Diplomate of the American Board of Clinical Lipidology Attending Cardiologist  Direct Dial: [phone]  Fax: [phone]  Website:  [email or web address] Nicely Kourtni Stineman 07/18/2023, 3:20 PM

## 2023-07-19 ENCOUNTER — Other Ambulatory Visit: Payer: Self-pay | Admitting: Internal Medicine

## 2023-07-22 ENCOUNTER — Other Ambulatory Visit: Payer: Self-pay | Admitting: Internal Medicine

## 2023-07-22 ENCOUNTER — Telehealth: Payer: Self-pay | Admitting: Internal Medicine

## 2023-07-22 ENCOUNTER — Other Ambulatory Visit: Payer: Self-pay | Admitting: Family

## 2023-07-22 DIAGNOSIS — E785 Hyperlipidemia, unspecified: Secondary | ICD-10-CM

## 2023-07-22 MED ORDER — METOPROLOL SUCCINATE ER 50 MG PO TB24: 50.0000 mg | ORAL_TABLET | Freq: Every day | ORAL | 3 refills | Status: DC

## 2023-07-22 MED ORDER — ROSUVASTATIN CALCIUM 40 MG PO TABS: 40.0000 mg | ORAL_TABLET | Freq: Every day | ORAL | 3 refills | Status: DC

## 2023-07-22 NOTE — Telephone Encounter (Signed)
Pt's medications were sent to pt's pharmacy as requested. Confirmation received.  

## 2023-07-22 NOTE — Telephone Encounter (Signed)
Called the pt's pharmacy and the pharmacy stated that they received both medications. I called the pt's wife and informed her as well. Wife verbalized understanding.

## 2023-07-22 NOTE — Telephone Encounter (Signed)
*  STAT* If patient is at the pharmacy, call can be transferred to refill team.   1. Which medications need to be refilled? (please list name of each medication and dose if known)  metoprolol succinate (TOPROL-XL) 50 MG 24 hr tablet   rosuvastatin (CRESTOR) 40 MG tablet   2. Which pharmacy/location (including street and city if local pharmacy) is medication to be sent to? [facility] [address] [place], [place] - [address]   3. Do they need a 30 day or 90 day supply? 90

## 2023-07-22 NOTE — Telephone Encounter (Signed)
Patient's wife is following up. She states she just ended a call with the pharmacy and they informed her that the prescription for Metoprolol was received, but Rosuvastatin was denied by the provider. Please clarify.

## 2023-07-24 ENCOUNTER — Telehealth: Payer: Self-pay | Admitting: Internal Medicine

## 2023-07-24 NOTE — Telephone Encounter (Signed)
Faxed paperwork for disability to [employer]. [place] at [phone]

## 2023-08-12 ENCOUNTER — Telehealth: Payer: Self-pay | Admitting: Family

## 2023-08-12 NOTE — Telephone Encounter (Signed)
Form has been placed in PCP folder.

## 2023-08-12 NOTE — Telephone Encounter (Signed)
Please let him know that I do not do disability determination forms. He can speak to his attorney who can help him find an attorney to help complete this for him. Does he want come back and pick up these forms? Have [place] mail to him?

## 2023-08-12 NOTE — Telephone Encounter (Signed)
Pt dropped off paperwork to be completed. Pt stated instructions are on form.

## 2023-08-13 ENCOUNTER — Other Ambulatory Visit: Payer: Self-pay | Admitting: Family

## 2023-08-14 ENCOUNTER — Other Ambulatory Visit: Payer: Self-pay | Admitting: Family

## 2023-08-14 DIAGNOSIS — F411 Generalized anxiety disorder: Secondary | ICD-10-CM

## 2023-08-15 NOTE — Telephone Encounter (Signed)
Spoke with pt, pt is aware and expressed understanding. Pt states he would like forms mailed back to him. Form will be placed upfront to mail out to pt.

## 2023-08-31 ENCOUNTER — Other Ambulatory Visit: Payer: Self-pay

## 2023-08-31 ENCOUNTER — Inpatient Hospital Stay (HOSPITAL_COMMUNITY)
Admission: EM | Admit: 2023-08-31 | Discharge: 2023-09-03 | DRG: 811 | Disposition: A | Discharge status: Home / Self Care | Payer: Medicaid Other | Attending: Family Medicine | Admitting: Family Medicine

## 2023-08-31 ENCOUNTER — Encounter (HOSPITAL_COMMUNITY): Payer: Self-pay

## 2023-08-31 ENCOUNTER — Emergency Department (HOSPITAL_COMMUNITY): Payer: Medicaid Other

## 2023-08-31 DIAGNOSIS — D122 Benign neoplasm of ascending colon: Secondary | ICD-10-CM | POA: Diagnosis present

## 2023-08-31 DIAGNOSIS — K921 Melena: Secondary | ICD-10-CM

## 2023-08-31 DIAGNOSIS — D126 Benign neoplasm of colon, unspecified: Secondary | ICD-10-CM

## 2023-08-31 DIAGNOSIS — E8809 Other disorders of plasma-protein metabolism, not elsewhere classified: Secondary | ICD-10-CM | POA: Diagnosis not present

## 2023-08-31 DIAGNOSIS — Z720 Tobacco use: Secondary | ICD-10-CM

## 2023-08-31 DIAGNOSIS — Z8719 Personal history of other diseases of the digestive system: Secondary | ICD-10-CM

## 2023-08-31 DIAGNOSIS — I4891 Unspecified atrial fibrillation: Secondary | ICD-10-CM

## 2023-08-31 DIAGNOSIS — R600 Localized edema: Secondary | ICD-10-CM | POA: Diagnosis not present

## 2023-08-31 DIAGNOSIS — E876 Hypokalemia: Secondary | ICD-10-CM | POA: Diagnosis present

## 2023-08-31 DIAGNOSIS — Y92091 Bathroom in other non-institutional residence as the place of occurrence of the external cause: Secondary | ICD-10-CM

## 2023-08-31 DIAGNOSIS — I4819 Other persistent atrial fibrillation: Secondary | ICD-10-CM | POA: Diagnosis present

## 2023-08-31 DIAGNOSIS — W1812XA Fall from or off toilet with subsequent striking against object, initial encounter: Secondary | ICD-10-CM | POA: Diagnosis present

## 2023-08-31 DIAGNOSIS — K429 Umbilical hernia without obstruction or gangrene: Secondary | ICD-10-CM

## 2023-08-31 DIAGNOSIS — R9431 Abnormal electrocardiogram [ECG] [EKG]: Secondary | ICD-10-CM | POA: Diagnosis not present

## 2023-08-31 DIAGNOSIS — K2270 Barrett's esophagus without dysplasia: Secondary | ICD-10-CM | POA: Diagnosis present

## 2023-08-31 DIAGNOSIS — F17210 Nicotine dependence, cigarettes, uncomplicated: Secondary | ICD-10-CM | POA: Diagnosis present

## 2023-08-31 DIAGNOSIS — E7800 Pure hypercholesterolemia, unspecified: Secondary | ICD-10-CM | POA: Diagnosis present

## 2023-08-31 DIAGNOSIS — Z8679 Personal history of other diseases of the circulatory system: Secondary | ICD-10-CM | POA: Diagnosis not present

## 2023-08-31 DIAGNOSIS — I5020 Unspecified systolic (congestive) heart failure: Secondary | ICD-10-CM | POA: Diagnosis not present

## 2023-08-31 DIAGNOSIS — I69254 Hemiplegia and hemiparesis following other nontraumatic intracranial hemorrhage affecting left non-dominant side: Secondary | ICD-10-CM

## 2023-08-31 DIAGNOSIS — Y92009 Unspecified place in unspecified non-institutional (private) residence as the place of occurrence of the external cause: Secondary | ICD-10-CM

## 2023-08-31 DIAGNOSIS — M109 Gout, unspecified: Secondary | ICD-10-CM | POA: Diagnosis present

## 2023-08-31 DIAGNOSIS — G4700 Insomnia, unspecified: Secondary | ICD-10-CM | POA: Diagnosis present

## 2023-08-31 DIAGNOSIS — E781 Pure hyperglyceridemia: Secondary | ICD-10-CM | POA: Diagnosis present

## 2023-08-31 DIAGNOSIS — K0889 Other specified disorders of teeth and supporting structures: Secondary | ICD-10-CM | POA: Diagnosis present

## 2023-08-31 DIAGNOSIS — F4310 Post-traumatic stress disorder, unspecified: Secondary | ICD-10-CM | POA: Diagnosis present

## 2023-08-31 DIAGNOSIS — Z888 Allergy status to other drugs, medicaments and biological substances status: Secondary | ICD-10-CM

## 2023-08-31 DIAGNOSIS — K5900 Constipation, unspecified: Secondary | ICD-10-CM | POA: Diagnosis present

## 2023-08-31 DIAGNOSIS — R1310 Dysphagia, unspecified: Secondary | ICD-10-CM

## 2023-08-31 DIAGNOSIS — D124 Benign neoplasm of descending colon: Secondary | ICD-10-CM | POA: Diagnosis present

## 2023-08-31 DIAGNOSIS — Z79899 Other long term (current) drug therapy: Secondary | ICD-10-CM

## 2023-08-31 DIAGNOSIS — Z8619 Personal history of other infectious and parasitic diseases: Secondary | ICD-10-CM

## 2023-08-31 DIAGNOSIS — W19XXXA Unspecified fall, initial encounter: Secondary | ICD-10-CM | POA: Diagnosis not present

## 2023-08-31 DIAGNOSIS — Z9889 Other specified postprocedural states: Secondary | ICD-10-CM

## 2023-08-31 DIAGNOSIS — K2289 Other specified disease of esophagus: Secondary | ICD-10-CM | POA: Diagnosis present

## 2023-08-31 DIAGNOSIS — F419 Anxiety disorder, unspecified: Secondary | ICD-10-CM | POA: Diagnosis present

## 2023-08-31 DIAGNOSIS — D509 Iron deficiency anemia, unspecified: Principal | ICD-10-CM | POA: Diagnosis present

## 2023-08-31 DIAGNOSIS — F32A Depression, unspecified: Secondary | ICD-10-CM | POA: Diagnosis present

## 2023-08-31 DIAGNOSIS — Z7901 Long term (current) use of anticoagulants: Secondary | ICD-10-CM

## 2023-08-31 DIAGNOSIS — D649 Anemia, unspecified: Secondary | ICD-10-CM

## 2023-08-31 DIAGNOSIS — K3189 Other diseases of stomach and duodenum: Secondary | ICD-10-CM | POA: Diagnosis present

## 2023-08-31 DIAGNOSIS — I110 Hypertensive heart disease with heart failure: Secondary | ICD-10-CM | POA: Diagnosis present

## 2023-08-31 DIAGNOSIS — K317 Polyp of stomach and duodenum: Secondary | ICD-10-CM | POA: Diagnosis present

## 2023-08-31 DIAGNOSIS — N179 Acute kidney failure, unspecified: Secondary | ICD-10-CM | POA: Diagnosis present

## 2023-08-31 DIAGNOSIS — Y908 Blood alcohol level of 240 mg/100 ml or more: Secondary | ICD-10-CM | POA: Diagnosis present

## 2023-08-31 DIAGNOSIS — F10129 Alcohol abuse with intoxication, unspecified: Secondary | ICD-10-CM | POA: Diagnosis present

## 2023-08-31 DIAGNOSIS — K219 Gastro-esophageal reflux disease without esophagitis: Secondary | ICD-10-CM | POA: Diagnosis present

## 2023-08-31 DIAGNOSIS — I428 Other cardiomyopathies: Secondary | ICD-10-CM | POA: Diagnosis present

## 2023-08-31 DIAGNOSIS — I5023 Acute on chronic systolic (congestive) heart failure: Secondary | ICD-10-CM | POA: Diagnosis not present

## 2023-08-31 DIAGNOSIS — Z560 Unemployment, unspecified: Secondary | ICD-10-CM

## 2023-08-31 DIAGNOSIS — D123 Benign neoplasm of transverse colon: Secondary | ICD-10-CM | POA: Diagnosis present

## 2023-08-31 DIAGNOSIS — K5730 Diverticulosis of large intestine without perforation or abscess without bleeding: Secondary | ICD-10-CM | POA: Diagnosis present

## 2023-08-31 LAB — URINALYSIS, ROUTINE W REFLEX MICROSCOPIC
Bilirubin Urine: NEGATIVE
Glucose, UA: NEGATIVE mg/dL
Hgb urine dipstick: NEGATIVE
Ketones, ur: NEGATIVE mg/dL
Leukocytes,Ua: NEGATIVE
Nitrite: NEGATIVE
Protein, ur: NEGATIVE mg/dL
Specific Gravity, Urine: 1.026 (ref 1.005–1.030)
pH: 5 (ref 5.0–8.0)

## 2023-08-31 LAB — HEMOGLOBIN AND HEMATOCRIT, BLOOD
HCT: 26.5 % — ABNORMAL LOW (ref 39.0–52.0)
Hemoglobin: 8 g/dL — ABNORMAL LOW (ref 13.0–17.0)

## 2023-08-31 LAB — RETICULOCYTES
Immature Retic Fract: 29.6 % — ABNORMAL HIGH (ref 2.3–15.9)
RBC.: 2.64 MIL/uL — ABNORMAL LOW (ref 4.22–5.81)
Retic Count, Absolute: 75.2 10*3/uL (ref 19.0–186.0)
Retic Ct Pct: 2.9 % (ref 0.4–3.1)

## 2023-08-31 LAB — CBC WITH DIFFERENTIAL/PLATELET
Abs Immature Granulocytes: 0.03 10*3/uL (ref 0.00–0.07)
Basophils Absolute: 0 10*3/uL (ref 0.0–0.1)
Basophils Relative: 1 %
Eosinophils Absolute: 0.2 10*3/uL (ref 0.0–0.5)
Eosinophils Relative: 3 %
HCT: 21.2 % — ABNORMAL LOW (ref 39.0–52.0)
Hemoglobin: 5.6 g/dL — CL (ref 13.0–17.0)
Immature Granulocytes: 0 %
Lymphocytes Relative: 31 %
Lymphs Abs: 2.2 10*3/uL (ref 0.7–4.0)
MCH: 21.2 pg — ABNORMAL LOW (ref 26.0–34.0)
MCHC: 26.4 g/dL — ABNORMAL LOW (ref 30.0–36.0)
MCV: 80.3 fL (ref 80.0–100.0)
Monocytes Absolute: 0.5 10*3/uL (ref 0.1–1.0)
Monocytes Relative: 6 %
Neutro Abs: 4.1 10*3/uL (ref 1.7–7.7)
Neutrophils Relative %: 59 %
Platelets: 225 10*3/uL (ref 150–400)
RBC: 2.64 MIL/uL — ABNORMAL LOW (ref 4.22–5.81)
RDW: 19.3 % — ABNORMAL HIGH (ref 11.5–15.5)
WBC: 7 10*3/uL (ref 4.0–10.5)
nRBC: 0.4 % — ABNORMAL HIGH (ref 0.0–0.2)

## 2023-08-31 LAB — POC OCCULT BLOOD, ED: Fecal Occult Bld: NEGATIVE

## 2023-08-31 LAB — BRAIN NATRIURETIC PEPTIDE: B Natriuretic Peptide: 200.6 pg/mL — ABNORMAL HIGH (ref 0.0–100.0)

## 2023-08-31 LAB — CK: Total CK: 45 U/L — ABNORMAL LOW (ref 49–397)

## 2023-08-31 LAB — SODIUM, URINE, RANDOM: Sodium, Ur: 22 mmol/L

## 2023-08-31 LAB — HIV ANTIBODY (ROUTINE TESTING W REFLEX): HIV Screen 4th Generation wRfx: NONREACTIVE

## 2023-08-31 LAB — PROCALCITONIN: Procalcitonin: <0.1 ng/mL

## 2023-08-31 LAB — COMPREHENSIVE METABOLIC PANEL
ALT: 16 U/L (ref 0–44)
AST: 29 U/L (ref 15–41)
Albumin: 2.5 g/dL — ABNORMAL LOW (ref 3.5–5.0)
Alkaline Phosphatase: 57 U/L (ref 38–126)
Anion gap: 12 (ref 5–15)
BUN: 11 mg/dL (ref 6–20)
CO2: 20 mmol/L — ABNORMAL LOW (ref 22–32)
Calcium: 8 mg/dL — ABNORMAL LOW (ref 8.9–10.3)
Chloride: 108 mmol/L (ref 98–111)
Creatinine, Ser: 1.8 mg/dL — ABNORMAL HIGH (ref 0.61–1.24)
GFR, Estimated: 43 mL/min — ABNORMAL LOW (ref >60)
Glucose, Bld: 111 mg/dL — ABNORMAL HIGH (ref 70–99)
Potassium: 3.2 mmol/L — ABNORMAL LOW (ref 3.5–5.1)
Sodium: 140 mmol/L (ref 135–145)
Total Bilirubin: 0.4 mg/dL (ref <1.2)
Total Protein: 5.6 g/dL — ABNORMAL LOW (ref 6.5–8.1)

## 2023-08-31 LAB — MAGNESIUM: Magnesium: 1.9 mg/dL (ref 1.7–2.4)

## 2023-08-31 LAB — PHOSPHORUS: Phosphorus: 3.5 mg/dL (ref 2.5–4.6)

## 2023-08-31 LAB — IRON AND TIBC
Iron: 13 ug/dL — ABNORMAL LOW (ref 45–182)
Saturation Ratios: 3 % — ABNORMAL LOW (ref 17.9–39.5)
TIBC: 526 ug/dL — ABNORMAL HIGH (ref 250–450)
UIBC: 513 ug/dL

## 2023-08-31 LAB — PREPARE RBC (CROSSMATCH)

## 2023-08-31 LAB — CREATININE, URINE, RANDOM: Creatinine, Urine: 137 mg/dL

## 2023-08-31 LAB — FERRITIN: Ferritin: 2 ng/mL — ABNORMAL LOW (ref 24–336)

## 2023-08-31 LAB — VITAMIN B12: Vitamin B-12: 226 pg/mL (ref 180–914)

## 2023-08-31 LAB — PROTIME-INR
INR: 1.2 (ref 0.8–1.2)
Prothrombin Time: 15.1 s (ref 11.4–15.2)

## 2023-08-31 LAB — FOLATE: Folate: 18.3 ng/mL (ref >5.9)

## 2023-08-31 LAB — ETHANOL: Alcohol, Ethyl (B): 287 mg/dL — ABNORMAL HIGH (ref <10)

## 2023-08-31 LAB — APTT: aPTT: 28 s (ref 24–36)

## 2023-08-31 LAB — AMMONIA: Ammonia: 11 umol/L (ref 9–35)

## 2023-08-31 LAB — PREALBUMIN: Prealbumin: 19 mg/dL (ref 18–38)

## 2023-08-31 MED ORDER — ALBUTEROL SULFATE (2.5 MG/3ML) 0.083% IN NEBU: 2.5000 mg | INHALATION_SOLUTION | Freq: Four times a day (QID) | RESPIRATORY_TRACT | Status: DC | PRN

## 2023-08-31 MED ORDER — PANTOPRAZOLE SODIUM 40 MG IV SOLR
40.0000 mg | Freq: Two times a day (BID) | INTRAVENOUS | Status: DC
  Administered 2023-08-31 – 2023-09-03 (×6): 40 mg via INTRAVENOUS
  Filled 2023-08-31 (×6): qty 10

## 2023-08-31 MED ORDER — IOHEXOL 350 MG/ML SOLN
75.0000 mL | Freq: Once | INTRAVENOUS | Status: AC | PRN
  Administered 2023-08-31: 75 mL via INTRAVENOUS

## 2023-08-31 MED ORDER — ONDANSETRON HCL 4 MG/2ML IJ SOLN: 4.0000 mg | Freq: Four times a day (QID) | INTRAMUSCULAR | Status: DC | PRN

## 2023-08-31 MED ORDER — FOLIC ACID 1 MG PO TABS
1.0000 mg | ORAL_TABLET | Freq: Every day | ORAL | Status: DC
  Administered 2023-08-31 – 2023-09-03 (×4): 1 mg via ORAL
  Filled 2023-08-31 (×5): qty 1

## 2023-08-31 MED ORDER — SODIUM CHLORIDE 0.9% IV SOLUTION: Freq: Once | INTRAVENOUS | Status: AC

## 2023-08-31 MED ORDER — POTASSIUM CHLORIDE CRYS ER 20 MEQ PO TBCR
40.0000 meq | EXTENDED_RELEASE_TABLET | ORAL | Status: AC
  Administered 2023-08-31: 40 meq via ORAL
  Filled 2023-08-31: qty 2

## 2023-08-31 MED ORDER — LORAZEPAM 1 MG PO TABS
0.0000 mg | ORAL_TABLET | Freq: Four times a day (QID) | ORAL | Status: DC
  Filled 2023-08-31: qty 1

## 2023-08-31 MED ORDER — LORAZEPAM 1 MG PO TABS
1.0000 mg | ORAL_TABLET | ORAL | Status: AC | PRN
Start: 2023-08-31 — End: 2023-09-03
  Administered 2023-08-31: 2 mg via ORAL
  Filled 2023-08-31: qty 2

## 2023-08-31 MED ORDER — LORAZEPAM 2 MG/ML IJ SOLN
0.0000 mg | Freq: Four times a day (QID) | INTRAMUSCULAR | Status: DC
Start: 2023-08-31 — End: 2023-09-02

## 2023-08-31 MED ORDER — FENOFIBRATE 160 MG PO TABS
160.0000 mg | ORAL_TABLET | Freq: Every day | ORAL | Status: DC
  Administered 2023-09-01 – 2023-09-03 (×3): 160 mg via ORAL
  Filled 2023-08-31 (×4): qty 1

## 2023-08-31 MED ORDER — LORAZEPAM 2 MG/ML IJ SOLN: 1.0000 mg | INTRAMUSCULAR | Status: AC | PRN

## 2023-08-31 MED ORDER — ALPRAZOLAM 0.25 MG PO TABS: 0.5000 mg | ORAL_TABLET | Freq: Every day | ORAL | Status: DC | PRN

## 2023-08-31 MED ORDER — ORAL CARE MOUTH RINSE: 15.0000 mL | OROMUCOSAL | Status: DC | PRN

## 2023-08-31 MED ORDER — THIAMINE HCL 100 MG/ML IJ SOLN
100.0000 mg | Freq: Every day | INTRAMUSCULAR | Status: DC
  Filled 2023-08-31 (×2): qty 2

## 2023-08-31 MED ORDER — ONDANSETRON HCL 4 MG PO TABS: 4.0000 mg | ORAL_TABLET | Freq: Four times a day (QID) | ORAL | Status: DC | PRN

## 2023-08-31 MED ORDER — PANTOPRAZOLE SODIUM 40 MG PO TBEC
40.0000 mg | DELAYED_RELEASE_TABLET | Freq: Every day | ORAL | Status: DC
  Administered 2023-08-31: 40 mg via ORAL
  Filled 2023-08-31 (×2): qty 1

## 2023-08-31 MED ORDER — THIAMINE MONONITRATE 100 MG PO TABS
100.0000 mg | ORAL_TABLET | Freq: Every day | ORAL | Status: DC
  Administered 2023-08-31 – 2023-09-03 (×4): 100 mg via ORAL
  Filled 2023-08-31 (×4): qty 1

## 2023-08-31 MED ORDER — SODIUM CHLORIDE 0.9% FLUSH
3.0000 mL | Freq: Two times a day (BID) | INTRAVENOUS | Status: DC
  Administered 2023-08-31 – 2023-09-02 (×5): 3 mL via INTRAVENOUS

## 2023-08-31 MED ORDER — ROSUVASTATIN CALCIUM 20 MG PO TABS
40.0000 mg | ORAL_TABLET | Freq: Every day | ORAL | Status: DC
  Administered 2023-08-31 – 2023-09-03 (×4): 40 mg via ORAL
  Filled 2023-08-31 (×5): qty 2

## 2023-08-31 MED ORDER — ALLOPURINOL 100 MG PO TABS
100.0000 mg | ORAL_TABLET | Freq: Every day | ORAL | Status: DC
  Administered 2023-08-31 – 2023-09-03 (×4): 100 mg via ORAL
  Filled 2023-08-31 (×4): qty 1

## 2023-08-31 MED ORDER — TRAZODONE HCL 50 MG PO TABS
50.0000 mg | ORAL_TABLET | Freq: Every evening | ORAL | Status: DC | PRN
  Administered 2023-08-31 – 2023-09-02 (×2): 50 mg via ORAL
  Filled 2023-08-31 (×2): qty 1

## 2023-08-31 MED ORDER — LORAZEPAM 2 MG/ML IJ SOLN: 0.0000 mg | Freq: Two times a day (BID) | INTRAMUSCULAR | Status: DC

## 2023-08-31 MED ORDER — FUROSEMIDE 10 MG/ML IJ SOLN: 40.0000 mg | Freq: Two times a day (BID) | INTRAMUSCULAR | Status: DC

## 2023-08-31 MED ORDER — LORAZEPAM 1 MG PO TABS
0.0000 mg | ORAL_TABLET | Freq: Two times a day (BID) | ORAL | Status: DC
Start: 2023-09-02 — End: 2023-09-04

## 2023-08-31 MED ORDER — POTASSIUM CHLORIDE 10 MEQ/100ML IV SOLN
10.0000 meq | INTRAVENOUS | Status: AC
  Administered 2023-08-31 (×3): 10 meq via INTRAVENOUS
  Filled 2023-08-31 (×3): qty 100

## 2023-08-31 MED ORDER — ADULT MULTIVITAMIN W/MINERALS CH
1.0000 | ORAL_TABLET | Freq: Every day | ORAL | Status: DC
Start: 2023-08-31 — End: 2023-09-03
  Administered 2023-08-31 – 2023-09-03 (×4): 1 via ORAL
  Filled 2023-08-31 (×5): qty 1

## 2023-08-31 MED ORDER — FUROSEMIDE 10 MG/ML IJ SOLN
40.0000 mg | Freq: Once | INTRAMUSCULAR | Status: AC
Start: 2023-08-31 — End: 2023-08-31
  Administered 2023-08-31: 40 mg via INTRAVENOUS
  Filled 2023-08-31: qty 4

## 2023-08-31 MED ORDER — SODIUM CHLORIDE 0.9 % IV SOLN: INTRAVENOUS | Status: DC

## 2023-08-31 MED ORDER — POTASSIUM CHLORIDE 10 MEQ/100ML IV SOLN
10.0000 meq | INTRAVENOUS | Status: AC
  Administered 2023-08-31: 10 meq via INTRAVENOUS
  Filled 2023-08-31: qty 100

## 2023-08-31 NOTE — ED Notes (Signed)
ED TO INPATIENT HANDOFF REPORT  ED Nurse Name and Phone #: [phone]  S Name/Age/Gender Daniel Butler 57 y.o. male Room/Bed: 003C/003C  Code Status   Code Status: Full Code  Home/SNF/Other Home Patient oriented to: self, place, time, and situation Is this baseline? Yes   Triage Complete: Triage complete  Chief Complaint Iron deficiency anemia [D50.9]  Triage Note Pt to ED via [facility] from home. Pt was getting up from toilet and fell backward hitting back of head on sink. Pt takes eliquis. Denies LOC. Pt c/o dizziness.  EMS VS 97% 114 Cbg 211 146/84 97.4   Allergies Allergies  Allergen Reactions   Zetia [Ezetimibe] Nausea And Vomiting    Level of Care/Admitting Diagnosis ED Disposition     ED Disposition  Admit   Condition  --   Comment  Hospital Area: [facility] [[id number]]  Level of Care: Telemetry Medical [104]  May place patient in observation at [facility] or [facility] if equivalent level of care is available:: No  Covid Evaluation: Asymptomatic - no recent exposure (last 10 days) testing not required  Diagnosis: Iron deficiency anemia [213315]  Admitting Physician: Clydie Braun [[id number]]  Attending Physician: Clydie Braun [[id number]]          B Medical/Surgery History Past Medical History:  Diagnosis Date   Brain aneurysm    Chicken pox    Depression    Elevated LFTs    Gout    Hernia of abdominal cavity 11/2020   High cholesterol    Hypertension    Past Surgical History:  Procedure Laterality Date   CARDIOVERSION N/A 01/22/2021   Procedure: CARDIOVERSION;  Surgeon: Meriam Sprague, MD;  Location: [facility] ENDOSCOPY;  Service: Cardiovascular;  Laterality: N/A;   CRANIOTOMY Right 03/02/2015   Procedure: CRANIOTOMY INTRACRANIAL  ANEURYSM FOR CLIPPING;  Surgeon: Lisbeth Renshaw, MD;  Location: [facility] NEURO ORS;  Service: Neurosurgery;  Laterality: Right;   IR GENERIC HISTORICAL  02/16/2016   IR ANGIO VERTEBRAL SEL  VERTEBRAL BILAT MOD SED 02/16/2016 Lisbeth Renshaw, MD [facility]-INTERV RAD   IR GENERIC HISTORICAL  02/16/2016   IR ANGIO INTRA EXTRACRAN SEL INTERNAL CAROTID BILAT MOD SED 02/16/2016 Lisbeth Renshaw, MD [facility]   RADIOLOGY WITH ANESTHESIA N/A 03/02/2015   Procedure: RADIOLOGY WITH ANESTHESIA;  Surgeon: Lisbeth Renshaw, MD;  Location: [facility] OR;  Service: Radiology;  Laterality: N/A;   RIGHT/LEFT HEART CATH AND CORONARY ANGIOGRAPHY N/A 12/19/2020   Procedure: RIGHT/LEFT HEART CATH AND CORONARY ANGIOGRAPHY;  Surgeon: Swaziland, Peter M, MD;  Location: [facility] INVASIVE CV LAB;  Service: Cardiovascular;  Laterality: N/A;   WISDOM TOOTH EXTRACTION       A IV Location/Drains/Wounds Patient Lines/Drains/Airways Status     Active Line/Drains/Airways     Name Placement date Placement time Site Days   Peripheral IV 08/31/23 20 G Left Antecubital 08/31/23  0458  Antecubital  less than 1   Peripheral IV 08/31/23 20 G Anterior;Proximal;Right Forearm 08/31/23  1238  Forearm  less than 1   Wound / Incision (Open or Dehisced) 12/15/20 Toe (Comment  which one) Anterior;Left Skin scracked and red 12/15/20  0500  Toe (Comment  which one)  989   Wound / Incision (Open or Dehisced) 12/15/20 Toe (Comment  which one) Anterior;Right skin dry and cracked, red 12/15/20  0500  Toe (Comment  which one)  989            Intake/Output Last 24 hours  Intake/Output Summary (Last 24 hours) at  08/31/2023 1340 Last data filed at 08/31/2023 1126 Gross per 24 hour  Intake 715 ml  Output --  Net 715 ml    Labs/Imaging Results for orders placed or performed during the hospital encounter of 08/31/23 (from the past 48 hours)  CBC with Differential     Status: Abnormal   Collection Time: 08/31/23  5:04 AM  Result Value Ref Range   WBC 7.0 4.0 - 10.5 K/uL   RBC 2.64 (L) 4.22 - 5.81 MIL/uL   Hemoglobin 5.6 (LL) 13.0 - 17.0 g/dL    Comment: REPEATED TO VERIFY THIS CRITICAL RESULT HAS VERIFIED AND BEEN CALLED TO LOU MARERICKS RN  BY SHERRY GALLOWAY ON 12 22 2024 AT 0525, AND HAS BEEN READ BACK.     HCT 21.2 (L) 39.0 - 52.0 %   MCV 80.3 80.0 - 100.0 fL   MCH 21.2 (L) 26.0 - 34.0 pg   MCHC 26.4 (L) 30.0 - 36.0 g/dL   RDW 16.1 (H) 09.6 - 04.5 %   Platelets 225 150 - 400 K/uL   nRBC 0.4 (H) 0.0 - 0.2 %   Neutrophils Relative % 59 %   Neutro Abs 4.1 1.7 - 7.7 K/uL   Lymphocytes Relative 31 %   Lymphs Abs 2.2 0.7 - 4.0 K/uL   Monocytes Relative 6 %   Monocytes Absolute 0.5 0.1 - 1.0 K/uL   Eosinophils Relative 3 %   Eosinophils Absolute 0.2 0.0 - 0.5 K/uL   Basophils Relative 1 %   Basophils Absolute 0.0 0.0 - 0.1 K/uL   Immature Granulocytes 0 %   Abs Immature Granulocytes 0.03 0.00 - 0.07 K/uL    Comment: Performed at [facility] Lab, [address] [place], [place] [address]  Ethanol     Status: Abnormal   Collection Time: 08/31/23  5:04 AM  Result Value Ref Range   Alcohol, Ethyl (B) 287 (H) <10 mg/dL    Comment: (NOTE) Lowest detectable limit for serum alcohol is 10 mg/dL.  For medical purposes only. Performed at [facility], [address] [place], [place] [address]   Comprehensive metabolic panel     Status: Abnormal   Collection Time: 08/31/23  5:04 AM  Result Value Ref Range   Sodium 140 135 - 145 mmol/L   Potassium 3.2 (L) 3.5 - 5.1 mmol/L   Chloride 108 98 - 111 mmol/L   CO2 20 (L) 22 - 32 mmol/L   Glucose, Bld 111 (H) 70 - 99 mg/dL    Comment: Glucose reference range applies only to samples taken after fasting for at least 8 hours.   BUN 11 6 - 20 mg/dL   Creatinine, Ser 8.29 (H) 0.61 - 1.24 mg/dL   Calcium 8.0 (L) 8.9 - 10.3 mg/dL   Total Protein 5.6 (L) 6.5 - 8.1 g/dL   Albumin 2.5 (L) 3.5 - 5.0 g/dL   AST 29 15 - 41 U/L   ALT 16 0 - 44 U/L   Alkaline Phosphatase 57 38 - 126 U/L   Total Bilirubin 0.4 <1.2 mg/dL   GFR, Estimated 43 (L) >60 mL/min    Comment: (NOTE) Calculated using the CKD-EPI Creatinine Equation (2021)    Anion gap 12 5 - 15    Comment: Performed at  [facility] Lab, [address], [place], [place] [address]  Folate     Status: None   Collection Time: 08/31/23  5:04 AM  Result Value Ref Range   Folate 18.3 >5.9 ng/mL  Comment: Performed at [facility], [address] [place], [place] [address]  Iron and TIBC     Status: Abnormal   Collection Time: 08/31/23  5:04 AM  Result Value Ref Range   Iron 13 (L) 45 - 182 ug/dL   TIBC 098 (H) 119 - 147 ug/dL   Saturation Ratios 3 (L) 17.9 - 39.5 %   UIBC 513 ug/dL    Comment: Performed at [facility], [address], [place], [place] [address]  Ferritin     Status: Abnormal   Collection Time: 08/31/23  5:04 AM  Result Value Ref Range   Ferritin 2 (L) 24 - 336 ng/mL    Comment: Performed at [facility] Lab, [address] [place], [place] [address]  Reticulocytes     Status: Abnormal   Collection Time: 08/31/23  5:04 AM  Result Value Ref Range   Retic Ct Pct 2.9 0.4 - 3.1 %   RBC. 2.64 (L) 4.22 - 5.81 MIL/uL   Retic Count, Absolute 75.2 19.0 - 186.0 K/uL   Immature Retic Fract 29.6 (H) 2.3 - 15.9 %    Comment: Performed at [facility] Lab, [address] [place], [place] [address]  Vitamin B12     Status: None   Collection Time: 08/31/23  5:04 AM  Result Value Ref Range   Vitamin B-12 226 180 - 914 pg/mL    Comment: (NOTE) This assay is not validated for testing neonatal or myeloproliferative syndrome specimens for Vitamin B12 levels. Performed at [facility], [address], [place], [place] [address]   Type and screen [facility]     Status: None (Preliminary result)   Collection Time: 08/31/23  5:30 AM  Result Value Ref Range   ABO/RH(D) A NEG    Antibody Screen NEG    Sample Expiration 09/03/2023,2359    Unit Number [id number]    Blood Component Type RED CELLS,LR    Unit division 00    Status of Unit ISSUED    Transfusion Status OK TO TRANSFUSE    Crossmatch Result Compatible    Unit Number [id number]    Blood  Component Type RED CELLS,LR    Unit division 00    Status of Unit ISSUED    Transfusion Status OK TO TRANSFUSE    Crossmatch Result      Compatible Performed at [facility] Lab, [address] [place], [place] [address]   Prepare RBC (crossmatch)     Status: None   Collection Time: 08/31/23  5:36 AM  Result Value Ref Range   Order Confirmation      ORDER PROCESSED BY BLOOD BANK Performed at [facility], [address], [place], [place] [address]   POC occult blood, ED     Status: None   Collection Time: 08/31/23  5:37 AM  Result Value Ref Range   Fecal Occult Bld NEGATIVE NEGATIVE  Prepare RBC (crossmatch)     Status: None   Collection Time: 08/31/23  8:51 AM  Result Value Ref Range   Order Confirmation      ORDER PROCESSED BY BLOOD BANK Performed at [facility], [address] [place], [place] [address]   Urinalysis, Routine w reflex microscopic -Urine, Clean Catch     Status: None   Collection Time: 08/31/23  9:11 AM  Result Value Ref Range   Color, Urine YELLOW YELLOW   APPearance CLEAR CLEAR  Specific Gravity, Urine 1.026 1.005 - 1.030   pH 5.0 5.0 - 8.0   Glucose, UA NEGATIVE NEGATIVE mg/dL   Hgb urine dipstick NEGATIVE NEGATIVE   Bilirubin Urine NEGATIVE NEGATIVE   Ketones, ur NEGATIVE NEGATIVE mg/dL   Protein, ur NEGATIVE NEGATIVE mg/dL   Nitrite NEGATIVE NEGATIVE   Leukocytes,Ua NEGATIVE NEGATIVE    Comment: Performed at [facility], [address] [place], [place] [address]  Sodium, urine, random     Status: None   Collection Time: 08/31/23  9:11 AM  Result Value Ref Range   Sodium, Ur 22 mmol/L    Comment: Performed at [facility] Lab, [address], [place], [place] [address]  Creatinine, urine, random     Status: None   Collection Time: 08/31/23  9:11 AM  Result Value Ref Range   Creatinine, Urine 137 mg/dL    Comment: Performed at [facility], [address] [place], [place] [address]  HIV Antibody (routine  testing w rflx)     Status: None   Collection Time: 08/31/23 11:15 AM  Result Value Ref Range   HIV Screen 4th Generation wRfx Non Reactive Non Reactive    Comment: Performed at [facility] Lab, [address], [place], [place] [address]  Magnesium     Status: None   Collection Time: 08/31/23 11:15 AM  Result Value Ref Range   Magnesium 1.9 1.7 - 2.4 mg/dL    Comment: Performed at [facility] Lab, [address], [place], [place] [address]  Phosphorus     Status: None   Collection Time: 08/31/23 11:15 AM  Result Value Ref Range   Phosphorus 3.5 2.5 - 4.6 mg/dL    Comment: Performed at [facility] Lab, [address], [place], [place] [address]  CK     Status: Abnormal   Collection Time: 08/31/23 11:15 AM  Result Value Ref Range   Total CK 45 (L) 49 - 397 U/L    Comment: Performed at [facility], [address], [place], [place] [address]   CT CHEST ABDOMEN PELVIS W CONTRAST Result Date: 08/31/2023 CLINICAL DATA:  Poly trauma. Status post fall striking head on sink. On anticoagulation therapy. EXAM: CT CHEST, ABDOMEN, AND PELVIS WITH CONTRAST TECHNIQUE: Multidetector CT imaging of the chest, abdomen and pelvis was performed following the standard protocol during bolus administration of intravenous contrast. RADIATION DOSE REDUCTION: This exam was performed according to the departmental dose-optimization program which includes automated exposure control, adjustment of the mA and/or kV according to patient size and/or use of iterative reconstruction technique. CONTRAST:  75mL OMNIPAQUE IOHEXOL 350 MG/ML SOLN COMPARISON:  12/15/2020 FINDINGS: CT CHEST FINDINGS Cardiovascular: Heart size is upper limits of normal. There is no pericardial effusion. Mild aortic atherosclerosis. Lad coronary artery calcifications. Mediastinum/Nodes: No enlarged mediastinal, hilar, or axillary lymph nodes. Thyroid gland, trachea, and esophagus demonstrate no significant findings. Lungs/Pleura: There is  a small right pleural effusion, image 52/3. There is ground-glass attenuation and subpleural consolidation within the right lower lobe, image 138/7. No pneumothorax identified. Stable left upper lobe lung nodule measuring 4 mm, image 51/7. Musculoskeletal: No chest wall mass or suspicious bone lesions identified. No fractures. CT ABDOMEN PELVIS FINDINGS Hepatobiliary: No hepatic injury or perihepatic hematoma. Gallbladder is unremarkable. Pancreas: Unremarkable. No pancreatic ductal dilatation or surrounding inflammatory changes. Spleen: No splenic injury or perisplenic hematoma. Adrenals/Urinary Tract: No adrenal hemorrhage or renal injury identified. Bladder is unremarkable. Upper pole left kidney cyst  measures 10 mm and 21 Hounsfield units compatible with a benign Bosniak class 2 cyst. No follow-up imaging recommended. Stomach/Bowel: Stomach is within normal limits. Appendix appears normal. No evidence of bowel wall thickening, distention, or inflammatory changes. Vascular/Lymphatic: Aortic atherosclerosis. No enlarged abdominal or pelvic lymph nodes. Reproductive: Mild prostate gland enlargement Other: No free fluid or fluid collections. Fat containing umbilical hernia. Musculoskeletal: Areas of subcutaneous soft tissue stranding are noted along bilateral flanks and overlying both hips. No acute or suspicious osseous findings. L4-5 degenerative disc disease. IMPRESSION: 1. Small right pleural effusion. 2. Ground-glass attenuation and subpleural consolidation within the right lower lobe. Differential considerations include pneumonia, aspiration, or in the acute posttraumatic setting pulmonary contusion. No associated overlying rib fractures identified. 3. Coronary artery calcifications. 4. Stable 4 mm left upper lobe lung nodule. No follow-up imaging recommended. 5. Areas of subcutaneous soft tissue stranding are noted along bilateral flanks and overlying both hips. Findings may reflect anasarca or areas of  superficial contusion. Clinical correlation advised. 6.  Aortic Atherosclerosis (ICD10-I70.0). Electronically Signed   By: Signa Kell M.D.   On: 08/31/2023 07:07   CT HEAD WO CONTRAST ( ) Result Date: 08/31/2023 CLINICAL DATA:  Blunt poly trauma.  Head trauma on Eliquis. EXAM: CT HEAD WITHOUT CONTRAST CT CERVICAL SPINE WITHOUT CONTRAST TECHNIQUE: Multidetector CT imaging of the head and cervical spine was performed following the standard protocol without intravenous contrast. Multiplanar CT image reconstructions of the cervical spine were also generated. RADIATION DOSE REDUCTION: This exam was performed according to the departmental dose-optimization program which includes automated exposure control, adjustment of the mA and/or kV according to patient size and/or use of iterative reconstruction technique. COMPARISON:  12/15/2020 FINDINGS: CT HEAD FINDINGS Brain: No evidence of acute infarction, hemorrhage, hydrocephalus, extra-axial collection or mass lesion/mass effect. 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 Encephalomalacia in the right frontal lobe beneath a burr hole for presumed ventricular drain. No new finding Vascular: Right-sided aneurysm clip as noted above. Atheromatous calcification. Skull: Unremarkable right pterional craniotomy Sinuses/Orbits: No evidence of injury CT CERVICAL SPINE FINDINGS Alignment: Normal. Skull base and vertebrae: No acute fracture. No primary bone lesion or focal pathologic process. Soft tissues and spinal canal: No prevertebral fluid or swelling. No visible canal hematoma. Disc levels:  No significant degeneration. Upper chest: Reported separately IMPRESSION: No evidence of acute intracranial or cervical spine injury. Electronically Signed   By: Tiburcio Pea M.D.   On: 08/31/2023 06:55   CT Cervical Spine Wo Contrast Result Date: 08/31/2023 CLINICAL DATA:  Blunt poly trauma.  Head trauma on Eliquis. EXAM: CT HEAD WITHOUT CONTRAST  CT CERVICAL SPINE WITHOUT CONTRAST TECHNIQUE: Multidetector CT imaging of the head and cervical spine was performed following the standard protocol without intravenous contrast. Multiplanar CT image reconstructions of the cervical spine were also generated. RADIATION DOSE REDUCTION: This exam was performed according to the departmental dose-optimization program which includes automated exposure control, adjustment of the mA and/or kV according to patient size and/or use of iterative reconstruction technique. COMPARISON:  12/15/2020 FINDINGS: CT HEAD FINDINGS Brain: No evidence of acute infarction, hemorrhage, hydrocephalus, extra-axial collection or mass lesion/mass effect. 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 Encephalomalacia in the right frontal lobe beneath a burr hole for presumed ventricular drain. No new finding Vascular: Right-sided aneurysm clip as noted above. Atheromatous calcification. Skull: Unremarkable right pterional craniotomy Sinuses/Orbits: No evidence of injury CT CERVICAL SPINE FINDINGS Alignment: Normal. Skull base and vertebrae: No acute fracture. No primary  bone lesion or focal pathologic process. Soft tissues and spinal canal: No prevertebral fluid or swelling. No visible canal hematoma. Disc levels:  No significant degeneration. Upper chest: Reported separately IMPRESSION: No evidence of acute intracranial or cervical spine injury. Electronically Signed   By: Tiburcio Pea M.D.   On: 08/31/2023 06:55    Pending Labs Unresulted Labs (From admission, onward)     Start     Ordered   09/01/23 0500  CBC  Tomorrow morning,   R        08/31/23 0837   09/01/23 0500  Comprehensive metabolic panel  Tomorrow morning,   R        08/31/23 0837   08/31/23 1158  Brain natriuretic peptide  Add-on,   AD        08/31/23 1158   08/31/23 0853  Protime-INR  Once,   R        08/31/23 0852   08/31/23 0853  APTT  Once,   R        08/31/23 0852   08/31/23 0850   Procalcitonin  Once,   R       References:    Procalcitonin Lower Respiratory Tract Infection AND Sepsis Procalcitonin Algorithm   08/31/23 0849   08/31/23 0843  Urea nitrogen, urine  Once,   R        08/31/23 0842            Vitals/Pain Today's Vitals   08/31/23 1126 08/31/23 1130 08/31/23 1141 08/31/23 1200  BP: 105/76 116/74 110/70 103/69  Pulse: (!) 110 (!) 113 (!) 119 (!) 120  Resp: (!) 25 15 14    Temp: 98.3 F (36.8 C)  97.8 F (36.6 C)   TempSrc: Oral     SpO2:  99% 98%   Weight:      Height:      PainSc:        Isolation Precautions No active isolations  Medications Medications  LORazepam (ATIVAN) injection 0-4 mg ( Intravenous Not Given 08/31/23 1309)    Or  LORazepam (ATIVAN) tablet 0-4 mg ( Oral See Alternative 08/31/23 1309)  LORazepam (ATIVAN) injection 0-4 mg (has no administration in time range)    Or  LORazepam (ATIVAN) tablet 0-4 mg (has no administration in time range)  thiamine (VITAMIN B1) tablet 100 mg (100 mg Oral Given 08/31/23 0926)    Or  thiamine (VITAMIN B1) injection 100 mg ( Intravenous See Alternative 08/31/23 0926)  allopurinol (ZYLOPRIM) tablet 100 mg (100 mg Oral Given 08/31/23 1318)  fenofibrate tablet 160 mg (160 mg Oral Not Given 08/31/23 1323)  rosuvastatin (CRESTOR) tablet 40 mg (40 mg Oral Given 08/31/23 0922)  traZODone (DESYREL) tablet 50 mg (has no administration in time range)  sodium chloride flush (NS) 0.9 % injection 3 mL (3 mLs Intravenous Given 08/31/23 1203)  ondansetron (ZOFRAN) tablet 4 mg (has no administration in time range)    Or  ondansetron (ZOFRAN) injection 4 mg (has no administration in time range)  albuterol (PROVENTIL) (2.5 MG/3ML) 0.083% nebulizer solution 2.5 mg (has no administration in time range)  LORazepam (ATIVAN) tablet 1-4 mg (has no administration in time range)    Or  LORazepam (ATIVAN) injection 1-4 mg (has no administration in time range)  folic acid (FOLVITE) tablet 1 mg (1 mg Oral Given  08/31/23 0922)  multivitamin with minerals tablet 1 tablet (1 tablet Oral Given 08/31/23 1319)  potassium chloride 10 mEq in 100 mL IVPB (10 mEq Intravenous New Bag/Given  08/31/23 1321)  pantoprazole (PROTONIX) injection 40 mg (has no administration in time range)  0.9 %  sodium chloride infusion (Manually program via Guardrails IV Fluids) (0 mLs Intravenous Stopped 08/31/23 1204)  iohexol (OMNIPAQUE) 350 MG/ML injection 75 mL (75 mLs Intravenous Contrast Given 08/31/23 0637)  potassium chloride SA (KLOR-CON M) CR tablet 40 mEq (40 mEq Oral Given 08/31/23 0921)  0.9 %  sodium chloride infusion (Manually program via Guardrails IV Fluids) ( Intravenous New Bag/Given 08/31/23 1204)  furosemide (LASIX) injection 40 mg (40 mg Intravenous Given 08/31/23 1318)    Mobility walks with device     Focused Assessments Neuro Assessment Handoff:  Swallow screen pass?  Meds whole in apple sauce  Cardiac Rhythm: Atrial fibrillation       Neuro Assessment:   Neuro Checks:      Has TPA been given? No If patient is a Neuro Trauma and patient is going to OR before floor call report to 4N Charge nurse: [phone] or [phone]  ,    R Recommendations: See Admitting Provider Note  Report given to:   Additional Notes:

## 2023-08-31 NOTE — Consult Note (Addendum)
Consultation  Referring Provider:    Primary Care Physician:  Olive Bass, FNP Primary Gastroenterologist:  Dr. Marina Goodell       Reason for Consultation:      DOA: 08/31/2023         Hospital Day: 1         HPI:   Daniel Butler is a 57 y.o. male with past medical history significant for right MCA aneurysm and subarachnoid hemorrhage in 2016 status post craniotomy, PTSD, anxiety, depression, nonischemic cardiomyopathy EF 45-50%, chronic systolic heart failure, A-fib with RVR on Eliquis, alcohol abuse, tobacco use, hyperlipidemia, hypertension, vertigo, gout, elevated liver function Previously negative hepatitis C and B in 2022  Presents to the ER with after fall due to intoxication, ethanol 287.  Patient found to have iron deficiency anemia with Hgb 5.6 compared to 14 a year prior and iron 13, saturation 3, ferritin 2 Platelets 225, no leukocytosis FOBT negative in the ER negative HIV B12 low normal at 226, folate 18, retake count appropriate Albumin 2.5, AST 29, ALT 16, alk phos 57, total bilirubin 0.5 Potassium 3.2, BUN 11 not elevated, creatinine 1.8  Due to profound anemia negative FOBT with recent fall patient had CT head, chest abdomen pelvis without contrast showed possible pulmonary contusion but no evidence of hematoma.  CT head with no acute intercranial injury liver with no injury, pancreas unremarkable, spleen no hematoma.  Stomach and bowel without inflammation or abnormality. Patient never had EGD or colonoscopy, discussed in 2023 due to anemia/melena however patient was self-pay and was never pursued.  No family was present at the time of my evaluation. Patient states he presented to the ER  for fall backwards, no LOC. States about 1 to 2 months ago he had several episodes of black coffee-ground appearing stool but has not had any lately.  Has had some looser stools but no overt diarrhea, constipation. He has had GERD with some dysphagia specifically to  dry foods and pills feels more in upper esophagus.  Describes drinking fruit juice recently with feel like it goes down the wrong pipe and occasionally having to vomit up the fruit juice.  If he slows down drinking and eating he states he does better. He has lost about 70 pounds over the last 2 years and has decreased appetite. He was prescribed pantoprazole outpatient but has been out of it for the last 2 months due to not returning for office follow-up. He also states he supposed to be on a fluid pill outpatient but has not had that refilled as well as had some increasing shortness of breath but denies chest pain, has mild cough but no mucus. Patient denies NSAIDs, drinks daily beer has at least 1-2 beers or 2 shots of cognac for sleeping purposes.  He has been smoking cigarettes for over 40 years was smoking 3 packs daily but since his aneurysm has gone on to 12 cigarettes daily. Patient is on Eliquis for A-fib last dose was yesterday evening.  Abnormal ED labs: Abnormal Labs Reviewed  CBC WITH DIFFERENTIAL/PLATELET - Abnormal; Notable for the following components:      Result Value   RBC 2.64 (*)    Hemoglobin 5.6 (*)    HCT 21.2 (*)    MCH 21.2 (*)    MCHC 26.4 (*)    RDW 19.3 (*)    nRBC 0.4 (*)    All other components within normal limits  ETHANOL - Abnormal; Notable for the  following components:   Alcohol, Ethyl (B) 287 (*)    All other components within normal limits  COMPREHENSIVE METABOLIC PANEL - Abnormal; Notable for the following components:   Potassium 3.2 (*)    CO2 20 (*)    Glucose, Bld 111 (*)    Creatinine, Ser 1.80 (*)    Calcium 8.0 (*)    Total Protein 5.6 (*)    Albumin 2.5 (*)    GFR, Estimated 43 (*)    All other components within normal limits  IRON AND TIBC - Abnormal; Notable for the following components:   Iron 13 (*)    TIBC 526 (*)    Saturation Ratios 3 (*)    All other components within normal limits  FERRITIN - Abnormal; Notable for the  following components:   Ferritin 2 (*)    All other components within normal limits  RETICULOCYTES - Abnormal; Notable for the following components:   RBC. 2.64 (*)    Immature Retic Fract 29.6 (*)    All other components within normal limits  CK - Abnormal; Notable for the following components:   Total CK 45 (*)    All other components within normal limits    Past Medical History:  Diagnosis Date   Brain aneurysm    Chicken pox    Depression    Elevated LFTs    Gout    Hernia of abdominal cavity 11/2020   High cholesterol    Hypertension     Surgical History:  He  has a past surgical history that includes Wisdom tooth extraction; Radiology with anesthesia (N/A, 03/02/2015); Craniotomy (Right, 03/02/2015); ir generic historical (02/16/2016); ir generic historical (02/16/2016); RIGHT/LEFT HEART CATH AND CORONARY ANGIOGRAPHY (N/A, 12/19/2020); and Cardioversion (N/A, 01/22/2021). Family History:  His family history is not on file. He was adopted. Social History:   reports that he has been smoking cigarettes. He has a 62.5 pack-year smoking history. He has never used smokeless tobacco. He reports current alcohol use of about 70.0 standard drinks of alcohol per week. He reports that he does not use drugs.  Prior to Admission medications   Medication Sig Start Date End Date Taking? Authorizing Provider  allopurinol (ZYLOPRIM) 100 MG tablet Take 1 tablet (100 mg total) by mouth daily. 06/16/23  Yes Olive Bass, FNP  ALPRAZolam Prudy Feeler) 0.5 MG tablet TAKE 1 TABLET BY MOUTH THREE TIMES A DAY AS NEEDED FOR ANXIETY 08/14/23  Yes Olive Bass, FNP  apixaban (ELIQUIS) 5 MG TABS tablet Take 1 tablet (5 mg total) by mouth 2 (two) times daily. 01/10/23  Yes Chrystie Nose, MD  fenofibrate (TRICOR) 145 MG tablet Take 1 tablet by mouth once daily 07/22/23  Yes Olive Bass, FNP  folic acid (FOLVITE) 1 MG tablet Take 1 tablet (1 mg total) by mouth daily. 06/16/23  Yes Olive Bass, FNP  furosemide (LASIX) 40 MG tablet Take 1 tablet by mouth once daily 07/23/23  Yes Hilty, Lisette Abu, MD  icosapent Ethyl (VASCEPA) 1 g capsule Take 2 capsules (2 g total) by mouth 2 (two) times daily. 03/25/23  Yes Hilty, Lisette Abu, MD  metoprolol succinate (TOPROL-XL) 50 MG 24 hr tablet Take 1 tablet (50 mg total) by mouth daily. Take with or immediately following a meal. 07/22/23  Yes Hilty, Lisette Abu, MD  rosuvastatin (CRESTOR) 40 MG tablet Take 1 tablet (40 mg total) by mouth daily. 07/22/23  Yes Hilty, Lisette Abu, MD  thiamine (VITAMIN B-1) 100 MG tablet  Take 100 mg by mouth daily.   Yes [provider]  traZODone (DESYREL) 50 MG tablet TAKE 1/2 TO 1 (ONE-HALF TO ONE) TABLET BY MOUTH AT BEDTIME AS NEEDED FOR SLEEP 08/14/23  Yes Olive Bass, FNP  pantoprazole (PROTONIX) 40 MG tablet Take 1 tablet (40 mg total) by mouth daily. Patient needs follow up appointment for future refills. Please call [phone] to schedule an appointment. Patient not taking: Reported on 08/31/2023 06/16/23   Meredith Pel, NP    Current Facility-Administered Medications  Medication Dose Route Frequency Provider Last Rate Last Admin   albuterol (PROVENTIL) (2.5 MG/3ML) 0.083% nebulizer solution 2.5 mg  2.5 mg Nebulization Q6H PRN Clydie Braun, MD       allopurinol (ZYLOPRIM) tablet 100 mg  100 mg Oral Daily Katrinka Blazing, Rondell A, MD       fenofibrate tablet 160 mg  160 mg Oral Daily Smith, Rondell A, MD       folic acid (FOLVITE) tablet 1 mg  1 mg Oral Daily Katrinka Blazing, Rondell A, MD   1 mg at 08/31/23 6578   furosemide (LASIX) injection 40 mg  40 mg Intravenous BID Smith, Rondell A, MD       LORazepam (ATIVAN) injection 0-4 mg  0-4 mg Intravenous Q6H Schuman, James T, PA-C       Or   LORazepam (ATIVAN) tablet 0-4 mg  0-4 mg Oral Q6H Schuman, James T, PA-C       [START ON 09/02/2023] LORazepam (ATIVAN) injection 0-4 mg  0-4 mg Intravenous Q12H Schuman, James T, PA-C       Or    [START ON 09/02/2023] LORazepam (ATIVAN) tablet 0-4 mg  0-4 mg Oral Q12H Schuman, James T, PA-C       LORazepam (ATIVAN) tablet 1-4 mg  1-4 mg Oral Q1H PRN Clydie Braun, MD       Or   LORazepam (ATIVAN) injection 1-4 mg  1-4 mg Intravenous Q1H PRN Clydie Braun, MD       multivitamin with minerals tablet 1 tablet  1 tablet Oral Daily Smith, Rondell A, MD       ondansetron (ZOFRAN) tablet 4 mg  4 mg Oral Q6H PRN Madelyn Flavors A, MD       Or   ondansetron (ZOFRAN) injection 4 mg  4 mg Intravenous Q6H PRN Smith, Rondell A, MD       pantoprazole (PROTONIX) EC tablet 40 mg  40 mg Oral Daily Smith, Rondell A, MD   40 mg at 08/31/23 0921   potassium chloride 10 mEq in 100 mL IVPB  10 mEq Intravenous Q1 Hr x 4 Smith, Rondell A, MD       rosuvastatin (CRESTOR) tablet 40 mg  40 mg Oral Daily Smith, Rondell A, MD   40 mg at 08/31/23 4696   sodium chloride flush (NS) 0.9 % injection 3 mL  3 mL Intravenous Q12H Smith, Rondell A, MD   3 mL at 08/31/23 1203   thiamine (VITAMIN B1) tablet 100 mg  100 mg Oral Daily Evlyn Kanner T, PA-C   100 mg at 08/31/23 2952   Or   thiamine (VITAMIN B1) injection 100 mg  100 mg Intravenous Daily Schuman, James T, PA-C       traZODone (DESYREL) tablet 50 mg  50 mg Oral QHS PRN Clydie Braun, MD       Current Outpatient Medications  Medication Sig Dispense Refill   allopurinol (ZYLOPRIM) 100 MG tablet Take 1  tablet (100 mg total) by mouth daily. 90 tablet 0   ALPRAZolam (XANAX) 0.5 MG tablet TAKE 1 TABLET BY MOUTH THREE TIMES A DAY AS NEEDED FOR ANXIETY 90 tablet 0   apixaban (ELIQUIS) 5 MG TABS tablet Take 1 tablet (5 mg total) by mouth 2 (two) times daily. 180 tablet 1   fenofibrate (TRICOR) 145 MG tablet Take 1 tablet by mouth once daily 90 tablet 0   folic acid (FOLVITE) 1 MG tablet Take 1 tablet (1 mg total) by mouth daily. 90 tablet 3   furosemide (LASIX) 40 MG tablet Take 1 tablet by mouth once daily 90 tablet 3   icosapent Ethyl (VASCEPA) 1 g capsule  Take 2 capsules (2 g total) by mouth 2 (two) times daily. 360 capsule 1   metoprolol succinate (TOPROL-XL) 50 MG 24 hr tablet Take 1 tablet (50 mg total) by mouth daily. Take with or immediately following a meal. 90 tablet 3   rosuvastatin (CRESTOR) 40 MG tablet Take 1 tablet (40 mg total) by mouth daily. 90 tablet 3   thiamine (VITAMIN B-1) 100 MG tablet Take 100 mg by mouth daily.     traZODone (DESYREL) 50 MG tablet TAKE 1/2 TO 1 (ONE-HALF TO ONE) TABLET BY MOUTH AT BEDTIME AS NEEDED FOR SLEEP 30 tablet 0   pantoprazole (PROTONIX) 40 MG tablet Take 1 tablet (40 mg total) by mouth daily. Patient needs follow up appointment for future refills. Please call [phone] to schedule an appointment. (Patient not taking: Reported on 08/31/2023) 30 tablet 0    Allergies as of 08/31/2023 - Review Complete 08/31/2023  Allergen Reaction Noted   Zetia [ezetimibe] Nausea And Vomiting 01/19/2018    Review of Systems:    Constitutional: No weight loss, fever, chills, weakness or fatigue HEENT: Eyes: No change in vision               Ears, Nose, Throat:  No change in hearing or congestion Skin: No rash or itching Cardiovascular: No chest pain, chest pressure or palpitations   Respiratory: No SOB or cough Gastrointestinal: See HPI and otherwise negative Genitourinary: No dysuria or change in urinary frequency Neurological: No headache, dizziness or syncope Musculoskeletal: No new muscle or joint pain Hematologic: No bleeding or bruising Psychiatric: No history of depression or anxiety     Physical Exam:  Vital signs in last 24 hours: Temp:  [97.8 F (36.6 C)-98.3 F (36.8 C)] 97.8 F (36.6 C) (12/22 1141) Pulse Rate:  [91-131] 120 (12/22 1200) Resp:  [14-39] 14 (12/22 1141) BP: (86-116)/(62-76) 103/69 (12/22 1200) SpO2:  [94 %-100 %] 98 % (12/22 1141) Weight:  [295 kg] 112 kg (12/22 0455)   Last BM recorded by nurses in past 5 days No data recorded  General:   Thin, chronically  ill-appearing, intoxicated male in no acute distress Head:  Normocephalic and atraumatic.  Poor dentition, dry mucosa Eyes: sclerae anicteric,conjunctive pale  Heart: Tachycardic, regular rhythm, systolic murmur Pulm: Clear anteriorly; no wheezing Abdomen:  Soft, Obese AB, Active bowel sounds. No tenderness, small periumbilical hernia. Without guarding and Without rebound, No organomegaly appreciated. Extremities:  With 1+ edema. Msk:  Symmetrical without gross deformities. Peripheral pulses intact.  Neurologic:  Alert and  oriented x4;  No focal deficits.  Skin:   Ecchymoses, no spider angiomas. Psychiatric:  Cooperative. Normal mood and affect.  LAB RESULTS: Recent Labs    08/31/23 0504  WBC 7.0  HGB 5.6*  HCT 21.2*  PLT 225   BMET Recent Labs  08/31/23 0504  NA 140  K 3.2*  CL 108  CO2 20*  GLUCOSE 111*  BUN 11  CREATININE 1.80*  CALCIUM 8.0*   LFT Recent Labs    08/31/23 0504  PROT 5.6*  ALBUMIN 2.5*  AST 29  ALT 16  ALKPHOS 57  BILITOT 0.4   PT/INR No results for input(s): "LABPROT", "INR" in the last 72 hours.  STUDIES: CT CHEST ABDOMEN PELVIS W CONTRAST Result Date: 08/31/2023 CLINICAL DATA:  Poly trauma. Status post fall striking head on sink. On anticoagulation therapy. EXAM: CT CHEST, ABDOMEN, AND PELVIS WITH CONTRAST TECHNIQUE: Multidetector CT imaging of the chest, abdomen and pelvis was performed following the standard protocol during bolus administration of intravenous contrast. RADIATION DOSE REDUCTION: This exam was performed according to the departmental dose-optimization program which includes automated exposure control, adjustment of the mA and/or kV according to patient size and/or use of iterative reconstruction technique. CONTRAST:  75mL OMNIPAQUE IOHEXOL 350 MG/ML SOLN COMPARISON:  12/15/2020 FINDINGS: CT CHEST FINDINGS Cardiovascular: Heart size is upper limits of normal. There is no pericardial effusion. Mild aortic atherosclerosis. Lad  coronary artery calcifications. Mediastinum/Nodes: No enlarged mediastinal, hilar, or axillary lymph nodes. Thyroid gland, trachea, and esophagus demonstrate no significant findings. Lungs/Pleura: There is a small right pleural effusion, image 52/3. There is ground-glass attenuation and subpleural consolidation within the right lower lobe, image 138/7. No pneumothorax identified. Stable left upper lobe lung nodule measuring 4 mm, image 51/7. Musculoskeletal: No chest wall mass or suspicious bone lesions identified. No fractures. CT ABDOMEN PELVIS FINDINGS Hepatobiliary: No hepatic injury or perihepatic hematoma. Gallbladder is unremarkable. Pancreas: Unremarkable. No pancreatic ductal dilatation or surrounding inflammatory changes. Spleen: No splenic injury or perisplenic hematoma. Adrenals/Urinary Tract: No adrenal hemorrhage or renal injury identified. Bladder is unremarkable. Upper pole left kidney cyst measures 10 mm and 21 Hounsfield units compatible with a benign Bosniak class 2 cyst. No follow-up imaging recommended. Stomach/Bowel: Stomach is within normal limits. Appendix appears normal. No evidence of bowel wall thickening, distention, or inflammatory changes. Vascular/Lymphatic: Aortic atherosclerosis. No enlarged abdominal or pelvic lymph nodes. Reproductive: Mild prostate gland enlargement Other: No free fluid or fluid collections. Fat containing umbilical hernia. Musculoskeletal: Areas of subcutaneous soft tissue stranding are noted along bilateral flanks and overlying both hips. No acute or suspicious osseous findings. L4-5 degenerative disc disease. IMPRESSION: 1. Small right pleural effusion. 2. Ground-glass attenuation and subpleural consolidation within the right lower lobe. Differential considerations include pneumonia, aspiration, or in the acute posttraumatic setting pulmonary contusion. No associated overlying rib fractures identified. 3. Coronary artery calcifications. 4. Stable 4 mm left  upper lobe lung nodule. No follow-up imaging recommended. 5. Areas of subcutaneous soft tissue stranding are noted along bilateral flanks and overlying both hips. Findings may reflect anasarca or areas of superficial contusion. Clinical correlation advised. 6.  Aortic Atherosclerosis (ICD10-I70.0). Electronically Signed   By: Signa Kell M.D.   On: 08/31/2023 07:07   CT HEAD WO CONTRAST ( ) Result Date: 08/31/2023 CLINICAL DATA:  Blunt poly trauma.  Head trauma on Eliquis. EXAM: CT HEAD WITHOUT CONTRAST CT CERVICAL SPINE WITHOUT CONTRAST TECHNIQUE: Multidetector CT imaging of the head and cervical spine was performed following the standard protocol without intravenous contrast. Multiplanar CT image reconstructions of the cervical spine were also generated. RADIATION DOSE REDUCTION: This exam was performed according to the departmental dose-optimization program which includes automated exposure control, adjustment of the mA and/or kV according to patient size and/or use of iterative reconstruction technique. COMPARISON:  12/15/2020 FINDINGS:  CT HEAD FINDINGS Brain: No evidence of acute infarction, hemorrhage, hydrocephalus, extra-axial collection or mass lesion/mass effect. 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 Encephalomalacia in the right frontal lobe beneath a burr hole for presumed ventricular drain. No new finding Vascular: Right-sided aneurysm clip as noted above. Atheromatous calcification. Skull: Unremarkable right pterional craniotomy Sinuses/Orbits: No evidence of injury CT CERVICAL SPINE FINDINGS Alignment: Normal. Skull base and vertebrae: No acute fracture. No primary bone lesion or focal pathologic process. Soft tissues and spinal canal: No prevertebral fluid or swelling. No visible canal hematoma. Disc levels:  No significant degeneration. Upper chest: Reported separately IMPRESSION: No evidence of acute intracranial or cervical spine injury. Electronically  Signed   By: Tiburcio Pea M.D.   On: 08/31/2023 06:55   CT Cervical Spine Wo Contrast Result Date: 08/31/2023 CLINICAL DATA:  Blunt poly trauma.  Head trauma on Eliquis. EXAM: CT HEAD WITHOUT CONTRAST CT CERVICAL SPINE WITHOUT CONTRAST TECHNIQUE: Multidetector CT imaging of the head and cervical spine was performed following the standard protocol without intravenous contrast. Multiplanar CT image reconstructions of the cervical spine were also generated. RADIATION DOSE REDUCTION: This exam was performed according to the departmental dose-optimization program which includes automated exposure control, adjustment of the mA and/or kV according to patient size and/or use of iterative reconstruction technique. COMPARISON:  12/15/2020 FINDINGS: CT HEAD FINDINGS Brain: No evidence of acute infarction, hemorrhage, hydrocephalus, extra-axial collection or mass lesion/mass effect. 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 Encephalomalacia in the right frontal lobe beneath a burr hole for presumed ventricular drain. No new finding Vascular: Right-sided aneurysm clip as noted above. Atheromatous calcification. Skull: Unremarkable right pterional craniotomy Sinuses/Orbits: No evidence of injury CT CERVICAL SPINE FINDINGS Alignment: Normal. Skull base and vertebrae: No acute fracture. No primary bone lesion or focal pathologic process. Soft tissues and spinal canal: No prevertebral fluid or swelling. No visible canal hematoma. Disc levels:  No significant degeneration. Upper chest: Reported separately IMPRESSION: No evidence of acute intracranial or cervical spine injury. Electronically Signed   By: Tiburcio Pea M.D.   On: 08/31/2023 06:55      Impression    Iron deficiency anemia Hemoglobin 5.6, a year ago was 14 Reports of melena in last 1 to 2 months, has been off pantoprazole, FOBT negative in the ER On Eliquis last dose yesterday afternoon Also reporting dysphagia possible  oropharyngeal dysphagia, has never had EGD or colonoscopy In setting of alcohol abuse  Alcohol abuse with intoxication Ethanol level 200  A-fib on Eliquis Hold eliquis, last dose 12/21 in the afternoon  History of elevated LFTs Likely secondary to alcohol use previous negative hepatitis panel  Fall due to intoxication Negative CT head, chest abdomen pelvis no signs of hematoma CPK low   Principal Problem:   Iron deficiency anemia    LOS: 0 days     Plan   With melena, alcohol use, dysphagia with profound anemia we will plan to proceed with EGD tomorrow with Dr. Tomasa Rand to rule out alcoholic gastritis/peptic ulcer disease, esophagitis, malignancy I do not think patient would be able to prep at this time, if EGD is unremarkable may need to proceed with colonoscopy in setting of IDA. -Continue to hold Eliquis, primary team discussed placing patient on heparin due to history of A-fib aneurysms, if patient is certain heparin would need to be held 4 hours prior to EGD. -Protonix 40 mg IV BID. -Clear liquid diet, NPO at midnight. --Continue to monitor H&H  with transfusion as needed to maintain hemoglobin greater than 7. - I thoroughly discussed the procedure to include nature, alternatives, benefits, and risks including but not limited to bleeding, perforation, infection, anesthesia/cardiac and pulmonary complications. Patient provides understanding and gave verbal consent to proceed.  Currently patient is consentable but may also be starting withdrawals with tachycardia and diaphoresis. CIWA protocol Thiamine, multivitamin folic acid Alcohol cessation discussion with the patient Patient also has some symptoms of possible oropharyngeal dysphagia consider modified barium swallow  Thank you for your kind consultation, we will continue to follow.   Doree Albee  08/31/2023, 12:10 PM  Gastroenterology attending:  I have also seen and evaluated this patient in person.  I agree  with Ms. Collier's note and assessment and plan.  I have performed the majority of the medical decision making which is of high complexity.  This man is an alcoholic with iron deficiency anemia and heme-negative stool.  There has been some recent suggestion of upper GI bleeding with melena.  Nutritional component of anemia certainly possible as well in this setting.  We are starting with an EGD.  It could explain the iron deficiency anemia completely though he is most likely going to need a colonoscopy.  The question of that would be timing.  We did not think he be ready to prep and wanted to initiate the workup.  Iva Boop, MD, [facility] See Loretha Stapler on call - gastroenterology for best contact person 08/31/2023 3:29 PM

## 2023-08-31 NOTE — ED Provider Notes (Signed)
Patient signed out to [place], PA-C.  Please review their note for the continuation of patient's care.  The plan at this point is anticipate admission pending scans due to anemia of unknown source.  Labs show suspected iron deficiency anemia.  Still pending CT imaging but do feel patient would benefit from admission and so we will consult hospitalist.  CT shows possible pulmonary contusion but otherwise no source of bleeding.  Again do feel this is iron deficiency anemia and hospitalist be consulted.  Patient stable for admission.  Consults: Arlyss Queen, MD Hospitalist  Spoke to the hospitalist and patient accepted for admission.  Patient does have significant history of alcohol abuse and does drink daily and so we will start CIWA on him.  Patient stable at this time for admission.    Netta Corrigan, PA-C 08/31/23 0744    Glendora Score, MD 09/02/23 [phone]

## 2023-08-31 NOTE — ED Notes (Signed)
Trauma Event Note    Rounded on pt- pt's wife at bedside-- pt admits to drinking ETOH daily  "2-3 beers and a few shots- but they are 40 proof" when asked if he has ever had any withdrawal symptoms from alcohol- he said he does get the shakes and has in the past seen "lizards coming out of the walls"-- denies any AH/VH at this time.   EDP notified to place CIWA orders and precautions.   Last imported Vital Signs BP 106/73   Pulse (!) 113   Temp 97.9 F (36.6 C) (Oral)   Resp 18   Ht 6\' 1"  (1.854 m)   Wt 246 lb 14.6 oz (112 kg)   SpO2 100%   BMI 32.58 kg/m   Trending CBC Recent Labs    08/31/23 0504  WBC 7.0  HGB 5.6*  HCT 21.2*  PLT 225    Trending Coag's No results for input(s): "APTT", "INR" in the last 72 hours.  Trending BMET Recent Labs    08/31/23 0504  NA 140  K 3.2*  CL 108  CO2 20*  BUN 11  CREATININE 1.80*  GLUCOSE 111*      Daniel Butler M [name]  Trauma Response RN  Please call TRN at [phone] for further assistance.

## 2023-08-31 NOTE — ED Notes (Signed)
CCMD called. Pt on monitor

## 2023-08-31 NOTE — ED Notes (Signed)
Pt has difficulty swallowing meds and food , informed MD.

## 2023-08-31 NOTE — Plan of Care (Signed)

## 2023-08-31 NOTE — Progress Notes (Signed)
Transition of Care [facility]) - CAGE-AID Screening   Patient Details  Name: Daniel Butler MRN: [id number] Date of Birth: 08/23/1966   Hewitt Shorts, RN Trauma Response Nurse Phone Number: [phone] 08/31/2023, 8:33 AM   CAGE-AID Screening:    Have You Ever Felt You Ought to Cut Down on Your Drinking or Drug Use?: Yes Have People Annoyed You By Critizing Your Drinking Or Drug Use?: No Have You Felt Bad Or Guilty About Your Drinking Or Drug Use?: No Have You Ever Had a Drink or Used Drugs First Thing In The Morning to Steady Your Nerves or to Get Rid of a Hangover?: Yes CAGE-AID Score: 2  Substance Abuse Education Offered: (S) Yes (Needs information on discharge please -)

## 2023-08-31 NOTE — H&P (Addendum)
History and Physical    Patient: Daniel Butler:[id number] DOB: 09-19-1965 DOA: 08/31/2023 DOS: the patient was seen and examined on 08/31/2023 PCP: Olive Bass, FNP  Patient coming from: Home via EMS  Chief Complaint:  Chief Complaint  Patient presents with   Fall   HPI: Daniel Butler is a 57 y.o. 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  His wife makes note that she was present and kept him from injuring himself.  He notes that he has been experiencing weakness, predominantly on the left side, and dizzy spells since his brain bleed.  Dizzy spells and weakness had recently gotten worse.  The patient also reported a history of edema in the legs, which he attributed to his GI not refilling his diuretic prescription.  His wife later clarifies that he has been on Lasix 40 mg daily, but still having lower extremity swelling.  He had been advised by [name] to be hospitalized due to concern for anemia during his last office visit back in November, but he had declined.  The patient has been experiencing issues with his stools including black stools, although last occurrence was possibly 1-2 months.  Patient reports that he is not taking any NSAIDs or aspirin in over a year.  He has been unable to secure an appointment with his GI for consultation until February.  His wife points out that he was in need of a colonoscopy and due to lack of insurance have been unable to arrange having one due to cost.  The patient is on Eliquis, which he has been taking regularly with last dose yesterday afternoon.  The patient also reported a history of difficulty swallowing usually with food.  Reports of  feeling like food and large pills goes down slowly and sometimes comes back up.  His wife makes note that he also gets choked up when drinking liquids too quickly as well.  He also reports having issues constipation which he treats by consuming an Ensure with protein every morning.  The patient consumes alcohol moderately, with an average of three to four beers and one to two shots of orange cognac daily. He also reported that he has been experiencing chills, which he attributes to his medication, Eliquis and metoprolol.  The patient has never had a colonoscopy or an upper or lower GI scope. He expressed a desire to be home for Christmas.  In the emergency department patient was noted to be afebrile with heart rates 105-127, respirations 16-39, blood pressures 86/63 - 102/62, and O2 saturations maintained.  Labs significant for hemoglobin 5.6, iron 13, TIBC 526, ferritin 3 potassium 3.2, BUN 11, creatinine 1.8, and alcohol level 287.  CT imaging of the head and cervical spine did not note any acute abnormality.  CT scan of the chest abdomen pelvis with contrast noted small right pleural effusion, groundglass attenuation and subpleural consolidation within the right lower lobe concerning for pneumonia or acute posterior traumatic pulmonary contusion, and increased subcutaneous soft tissue stranding noted along the bilateral flanks and overlying both hips concerning for anasarca or superficial contusion.  Fecal occult testing was negative.  Patient was typed and screened and ordered 1 units of packed red blood cells.  Patient had been placed on  CIWA protocols.  Review of Systems: As mentioned in the history of present illness. All other systems reviewed and are negative. Past Medical History:  Diagnosis Date   Brain aneurysm    Chicken pox    Depression    Elevated LFTs    Gout    Hernia of abdominal cavity 11/2020   High cholesterol    Hypertension    Past Surgical History:  Procedure Laterality  Date   CARDIOVERSION N/A 01/22/2021   Procedure: CARDIOVERSION;  Surgeon: Meriam Sprague, MD;  Location: [facility] ENDOSCOPY;  Service: Cardiovascular;  Laterality: N/A;   CRANIOTOMY Right 03/02/2015   Procedure: CRANIOTOMY INTRACRANIAL  ANEURYSM FOR CLIPPING;  Surgeon: Lisbeth Renshaw, MD;  Location: [facility] NEURO ORS;  Service: Neurosurgery;  Laterality: Right;   IR GENERIC HISTORICAL  02/16/2016   IR ANGIO VERTEBRAL SEL VERTEBRAL BILAT MOD SED 02/16/2016 Lisbeth Renshaw, MD [facility]-INTERV RAD   IR GENERIC HISTORICAL  02/16/2016   IR ANGIO INTRA EXTRACRAN SEL INTERNAL CAROTID BILAT MOD SED 02/16/2016 Lisbeth Renshaw, MD [facility]   RADIOLOGY WITH ANESTHESIA N/A 03/02/2015   Procedure: RADIOLOGY WITH ANESTHESIA;  Surgeon: Lisbeth Renshaw, MD;  Location: [facility] OR;  Service: Radiology;  Laterality: N/A;   RIGHT/LEFT HEART CATH AND CORONARY ANGIOGRAPHY N/A 12/19/2020   Procedure: RIGHT/LEFT HEART CATH AND CORONARY ANGIOGRAPHY;  Surgeon: [place], Peter M, MD;  Location: [facility] INVASIVE CV LAB;  Service: Cardiovascular;  Laterality: N/A;   WISDOM TOOTH EXTRACTION     Social History:  reports that he has been smoking cigarettes. He has a 62.5 pack-year smoking history. He has never used smokeless tobacco. He reports current alcohol use of about 70.0 standard drinks of alcohol per week. He reports that he does not use drugs.  Allergies  Allergen Reactions   Zetia [Ezetimibe] Nausea And Vomiting    Family History  Adopted: Yes    Prior to Admission medications   Medication Sig Start Date End Date Taking? Authorizing Provider  allopurinol (ZYLOPRIM) 100 MG tablet Take 1 tablet (100 mg total) by mouth daily. 06/16/23  Yes Olive Bass, FNP  ALPRAZolam Prudy Feeler) 0.5 MG tablet TAKE 1 TABLET BY MOUTH THREE TIMES A DAY AS NEEDED FOR ANXIETY 08/14/23  Yes Olive Bass, FNP  apixaban (ELIQUIS) 5 MG TABS tablet Take 1 tablet (5 mg total) by mouth 2 (two) times daily. 01/10/23  Yes Chrystie Nose, MD   fenofibrate (TRICOR) 145 MG tablet Take 1 tablet by mouth once daily 07/22/23  Yes Olive Bass, FNP  folic acid (FOLVITE) 1 MG tablet Take 1 tablet (1 mg total) by mouth daily. 06/16/23  Yes Olive Bass, FNP  furosemide (LASIX) 40 MG tablet Take 1 tablet by mouth once daily 07/23/23  Yes Hilty, Lisette Abu, MD  icosapent Ethyl (VASCEPA) 1 g capsule Take 2 capsules (2 g total) by mouth 2 (two) times daily. 03/25/23  Yes Hilty, Lisette Abu, MD  metoprolol succinate (TOPROL-XL) 50 MG 24 hr tablet Take 1 tablet (50 mg total) by mouth daily. Take with or immediately following a meal. 07/22/23  Yes Hilty, Lisette Abu, MD  rosuvastatin (CRESTOR) 40 MG tablet Take 1 tablet (40 mg total) by mouth daily. 07/22/23  Yes Hilty, Lisette Abu, MD  thiamine (VITAMIN B-1) 100 MG tablet Take 100 mg by mouth daily.   Yes [provider]  traZODone (DESYREL) 50 MG tablet TAKE 1/2 TO 1 (ONE-HALF TO ONE) TABLET BY MOUTH AT BEDTIME AS NEEDED FOR SLEEP 08/14/23  Yes Ria Clock  Margarita Grizzle, FNP  pantoprazole (PROTONIX) 40 MG tablet Take 1 tablet (40 mg total) by mouth daily. Patient needs follow up appointment for future refills. Please call [phone] to schedule an appointment. Patient not taking: Reported on 08/31/2023 06/16/23   Meredith Pel, NP    Physical Exam: Vitals:   08/31/23 0530 08/31/23 0600 08/31/23 0645 08/31/23 0701  BP: 97/66 (!) 86/63 102/62 106/73  Pulse: (!) 105 (!) 127 (!) 112 (!) 113  Resp: 16 (!) 23 18 18   Temp:   98.1 F (36.7 C) 97.9 F (36.6 C)  TempSrc:   Oral Oral  SpO2: 100% 100% 100% 100%  Weight:      Height:         Constitutional: Middle-age male currently in NAD, calm, comfortable Eyes: PERRL, lids and conjunctivae normal ENMT: Mucous membranes are moist.  Poor dentition with multiple dental caries and missing teeth Neck: normal, supple  Respiratory: clear to auscultation bilaterally, no wheezing, no crackles. Normal respiratory effort. No accessory  muscle use.  Cardiovascular: Irregular with at least 3+ pitting bilateral lower extremity edema. Abdomen: no tenderness, no masses palpated.  Umbilical hernia present that is easily reducible.  Bowel sounds positive.  Musculoskeletal: no clubbing / cyanosis. No joint deformity upper and lower extremities. Good ROM, no contractures. Normal muscle tone.  Skin: no rashes, lesions, ulcers.  Pallor present. Neurologic: CN 2-12 grossly intact.  Patient able to move all extremities, but weak side of his weaker. Psychiatric:.  Judgment and insight.  Alert and oriented x 3. Normal mood.   Data Reviewed:  EKG reveals atrial fibrillation at 109 bpm.  Reviewed labs, imaging, and pertinent records  Assessment and Plan:  Fall at home Acute.  Patient presents after having fall at home.  CT imaging of the head, cervical spine, chest, abdomen, pelvis without acute fracture. Thought secondary to patient being acutely intoxicated, but drop in blood pressure also possible given low hemoglobin. -Admit to a telemetry bed -Bed alarm -Up with assistance -Check CK -PT to evaluate and treat  Iron deficiency anemia Acute.  On admission patient noted to have a hemoglobin of 5.6 with MCV 80.3 at lower end of normal, MCH 21.2, and RDW elevated at 19.3.  Iron noted to be low at 13, TIBC elevated at 526, and ferritin low at 3 confirming iron deficiency.  Stool guaiacs were noted to be negative.  Patient had been typed and screened and ordered to be transfused 1 unit of packed red blood cells. -Transfuse additional 1 unit of PRBCs goal hemoglobin should be 8 given heart history for total of 2 units of PRBCs to be given -[facility] consulted, will follow-up for any further recommendations  Dysphagia Patient reports having difficulty swallowing for which feels like certain foods and pills go down his esophagus slowly. -Aspiration precautions with elevation head of bed -Speech therapy consult  Possible acute kidney  injury Creatinine noted to be 1.8.  Baseline creatinine previously noted to be around 1.3 when checked back in 2023.  Possibly secondary to hypoperfusion -Monitor intake and output -Avoid possible nephrotoxic agents -Check urinalysis(without signs for infection) and CK given fall -Check urine creatinine, sodium, and urea -Continue to monitor kidney function  Bilateral lower extremity edema Heart failure with reduced ejection fraction Hypoalbuminemia Acute on chronic.  Patient presents with  3+ pitting bilateral lower extremity edema.  Last echocardiogram noted 45 to 50% with indeterminate diastolic parameters in 01/06/2023.  Question heart failure versus hypoalbuminemia. -Strict I&Os and daily weights -Check BNP  and prealbumin -Elevate legs -Lasix 40 mg IV x 1 dose.  Reassess in a.m. and continue diuretics if felt medically appropriate  Hypokalemia Acute.  Potassium noted to be low at 3.2. -Initially gave potassium chloride 40 mEq p.o. but patient had episode of vomiting and difficulty swallowing which potassium chloride switched to IV -Continue to monitor and replace as needed  Persistent atrial fibrillation on chronic anticoagulation Patient noted to be in atrial fibrillation. -Goal potassium at least 4 and magnesium at least 2 -Hold Eliquis.  Plan to place on heparin drip if needing to be off Eliquis for any prolonged period of time.  Alcohol abuse with acute intoxication Patient reports drinking 2-4 beers as well as shots of orange cognac regularly.  Alcohol level elevated at 287. 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  Patient has residual left-sided weakness as a result.  Tobacco Abuse Patient currently continues to smoke cigarettes. - I have counseled the patient on the need of cessation of smoking  - A nicotine  patch was offered  Umbilical hernia Patient has a reducible umbilical hernia present.  Bowel sounds present in all 4 quadrants. -Recommend outpatient follow-up with general surgery   GI prophylaxis: Protonix DVT prophylaxis: SCDs Advance Care Planning:   Code Status: Full Code    Consults: [facility]  Family Communication: Patient's wife updated over the phone  Severity of Illness: The appropriate patient status for this patient is INPATIENT. Inpatient status is judged to be reasonable and necessary in order to provide the required intensity of service to ensure the patient's safety. The patient's presenting symptoms, physical exam findings, and initial radiographic and laboratory data in the context of their chronic comorbidities is felt to place them at high risk for further clinical deterioration. Furthermore, it is not anticipated that the patient will be medically stable for discharge from the hospital within 2 midnights of admission.   * I certify that at the point of admission it is my clinical judgment that the patient will require inpatient hospital care spanning beyond 2 midnights from the point of admission due to high intensity of service, high risk for further deterioration and high frequency of surveillance required.*  Author: Clydie Braun, MD 08/31/2023 7:37 AM  For on call review [email or web address].

## 2023-08-31 NOTE — Progress Notes (Signed)
 CSW received consult for patient for substance abuse resources - resources added to AVS.  Edwin Dada, MSW, LCSW Transitions of Care  Clinical Social Worker II [phone]

## 2023-08-31 NOTE — Discharge Instructions (Signed)

## 2023-08-31 NOTE — H&P (View-Only) (Signed)
Consultation  Referring Provider:    Primary Care Physician:  Olive Bass, FNP Primary Gastroenterologist:  Dr. Marina Goodell       Reason for Consultation:      DOA: 08/31/2023         Hospital Day: 1         HPI:   Daniel Butler is a 57 y.o. male with past medical history significant for right MCA aneurysm and subarachnoid hemorrhage in 2016 status post craniotomy, PTSD, anxiety, depression, nonischemic cardiomyopathy EF 45-50%, chronic systolic heart failure, A-fib with RVR on Eliquis, alcohol abuse, tobacco use, hyperlipidemia, hypertension, vertigo, gout, elevated liver function Previously negative hepatitis C and B in 2022  Presents to the ER with after fall due to intoxication, ethanol 287.  Patient found to have iron deficiency anemia with Hgb 5.6 compared to 14 a year prior and iron 13, saturation 3, ferritin 2 Platelets 225, no leukocytosis FOBT negative in the ER negative HIV B12 low normal at 226, folate 18, retake count appropriate Albumin 2.5, AST 29, ALT 16, alk phos 57, total bilirubin 0.5 Potassium 3.2, BUN 11 not elevated, creatinine 1.8  Due to profound anemia negative FOBT with recent fall patient had CT head, chest abdomen pelvis without contrast showed possible pulmonary contusion but no evidence of hematoma.  CT head with no acute intercranial injury liver with no injury, pancreas unremarkable, spleen no hematoma.  Stomach and bowel without inflammation or abnormality. Patient never had EGD or colonoscopy, discussed in 2023 due to anemia/melena however patient was self-pay and was never pursued.  No family was present at the time of my evaluation. Patient states he presented to the ER  for fall backwards, no LOC. States about 1 to 2 months ago he had several episodes of black coffee-ground appearing stool but has not had any lately.  Has had some looser stools but no overt diarrhea, constipation. He has had GERD with some dysphagia specifically to  dry foods and pills feels more in upper esophagus.  Describes drinking fruit juice recently with feel like it goes down the wrong pipe and occasionally having to vomit up the fruit juice.  If he slows down drinking and eating he states he does better. He has lost about 70 pounds over the last 2 years and has decreased appetite. He was prescribed pantoprazole outpatient but has been out of it for the last 2 months due to not returning for office follow-up. He also states he supposed to be on a fluid pill outpatient but has not had that refilled as well as had some increasing shortness of breath but denies chest pain, has mild cough but no mucus. Patient denies NSAIDs, drinks daily beer has at least 1-2 beers or 2 shots of cognac for sleeping purposes.  He has been smoking cigarettes for over 40 years was smoking 3 packs daily but since his aneurysm has gone on to 12 cigarettes daily. Patient is on Eliquis for A-fib last dose was yesterday evening.  Abnormal ED labs: Abnormal Labs Reviewed  CBC WITH DIFFERENTIAL/PLATELET - Abnormal; Notable for the following components:      Result Value   RBC 2.64 (*)    Hemoglobin 5.6 (*)    HCT 21.2 (*)    MCH 21.2 (*)    MCHC 26.4 (*)    RDW 19.3 (*)    nRBC 0.4 (*)    All other components within normal limits  ETHANOL - Abnormal; Notable for the  following components:   Alcohol, Ethyl (B) 287 (*)    All other components within normal limits  COMPREHENSIVE METABOLIC PANEL - Abnormal; Notable for the following components:   Potassium 3.2 (*)    CO2 20 (*)    Glucose, Bld 111 (*)    Creatinine, Ser 1.80 (*)    Calcium 8.0 (*)    Total Protein 5.6 (*)    Albumin 2.5 (*)    GFR, Estimated 43 (*)    All other components within normal limits  IRON AND TIBC - Abnormal; Notable for the following components:   Iron 13 (*)    TIBC 526 (*)    Saturation Ratios 3 (*)    All other components within normal limits  FERRITIN - Abnormal; Notable for the  following components:   Ferritin 2 (*)    All other components within normal limits  RETICULOCYTES - Abnormal; Notable for the following components:   RBC. 2.64 (*)    Immature Retic Fract 29.6 (*)    All other components within normal limits  CK - Abnormal; Notable for the following components:   Total CK 45 (*)    All other components within normal limits    Past Medical History:  Diagnosis Date   Brain aneurysm    Chicken pox    Depression    Elevated LFTs    Gout    Hernia of abdominal cavity 11/2020   High cholesterol    Hypertension     Surgical History:  He  has a past surgical history that includes Wisdom tooth extraction; Radiology with anesthesia (N/A, 03/02/2015); Craniotomy (Right, 03/02/2015); ir generic historical (02/16/2016); ir generic historical (02/16/2016); RIGHT/LEFT HEART CATH AND CORONARY ANGIOGRAPHY (N/A, 12/19/2020); and Cardioversion (N/A, 01/22/2021). Family History:  His family history is not on file. He was adopted. Social History:   reports that he has been smoking cigarettes. He has a 62.5 pack-year smoking history. He has never used smokeless tobacco. He reports current alcohol use of about 70.0 standard drinks of alcohol per week. He reports that he does not use drugs.  Prior to Admission medications   Medication Sig Start Date End Date Taking? Authorizing Provider  allopurinol (ZYLOPRIM) 100 MG tablet Take 1 tablet (100 mg total) by mouth daily. 06/16/23  Yes Olive Bass, FNP  ALPRAZolam Prudy Feeler) 0.5 MG tablet TAKE 1 TABLET BY MOUTH THREE TIMES A DAY AS NEEDED FOR ANXIETY 08/14/23  Yes Olive Bass, FNP  apixaban (ELIQUIS) 5 MG TABS tablet Take 1 tablet (5 mg total) by mouth 2 (two) times daily. 01/10/23  Yes Chrystie Nose, MD  fenofibrate (TRICOR) 145 MG tablet Take 1 tablet by mouth once daily 07/22/23  Yes Olive Bass, FNP  folic acid (FOLVITE) 1 MG tablet Take 1 tablet (1 mg total) by mouth daily. 06/16/23  Yes Olive Bass, FNP  furosemide (LASIX) 40 MG tablet Take 1 tablet by mouth once daily 07/23/23  Yes Hilty, Lisette Abu, MD  icosapent Ethyl (VASCEPA) 1 g capsule Take 2 capsules (2 g total) by mouth 2 (two) times daily. 03/25/23  Yes Hilty, Lisette Abu, MD  metoprolol succinate (TOPROL-XL) 50 MG 24 hr tablet Take 1 tablet (50 mg total) by mouth daily. Take with or immediately following a meal. 07/22/23  Yes Hilty, Lisette Abu, MD  rosuvastatin (CRESTOR) 40 MG tablet Take 1 tablet (40 mg total) by mouth daily. 07/22/23  Yes Hilty, Lisette Abu, MD  thiamine (VITAMIN B-1) 100 MG tablet  Take 100 mg by mouth daily.   Yes [provider]  traZODone (DESYREL) 50 MG tablet TAKE 1/2 TO 1 (ONE-HALF TO ONE) TABLET BY MOUTH AT BEDTIME AS NEEDED FOR SLEEP 08/14/23  Yes Olive Bass, FNP  pantoprazole (PROTONIX) 40 MG tablet Take 1 tablet (40 mg total) by mouth daily. Patient needs follow up appointment for future refills. Please call [phone] to schedule an appointment. Patient not taking: Reported on 08/31/2023 06/16/23   Meredith Pel, NP    Current Facility-Administered Medications  Medication Dose Route Frequency Provider Last Rate Last Admin   albuterol (PROVENTIL) (2.5 MG/3ML) 0.083% nebulizer solution 2.5 mg  2.5 mg Nebulization Q6H PRN Clydie Braun, MD       allopurinol (ZYLOPRIM) tablet 100 mg  100 mg Oral Daily Katrinka Blazing, Rondell A, MD       fenofibrate tablet 160 mg  160 mg Oral Daily Smith, Rondell A, MD       folic acid (FOLVITE) tablet 1 mg  1 mg Oral Daily Katrinka Blazing, Rondell A, MD   1 mg at 08/31/23 6578   furosemide (LASIX) injection 40 mg  40 mg Intravenous BID Smith, Rondell A, MD       LORazepam (ATIVAN) injection 0-4 mg  0-4 mg Intravenous Q6H Schuman, James T, PA-C       Or   LORazepam (ATIVAN) tablet 0-4 mg  0-4 mg Oral Q6H Schuman, James T, PA-C       [START ON 09/02/2023] LORazepam (ATIVAN) injection 0-4 mg  0-4 mg Intravenous Q12H Schuman, James T, PA-C       Or    [START ON 09/02/2023] LORazepam (ATIVAN) tablet 0-4 mg  0-4 mg Oral Q12H Schuman, James T, PA-C       LORazepam (ATIVAN) tablet 1-4 mg  1-4 mg Oral Q1H PRN Clydie Braun, MD       Or   LORazepam (ATIVAN) injection 1-4 mg  1-4 mg Intravenous Q1H PRN Clydie Braun, MD       multivitamin with minerals tablet 1 tablet  1 tablet Oral Daily Smith, Rondell A, MD       ondansetron (ZOFRAN) tablet 4 mg  4 mg Oral Q6H PRN Madelyn Flavors A, MD       Or   ondansetron (ZOFRAN) injection 4 mg  4 mg Intravenous Q6H PRN Smith, Rondell A, MD       pantoprazole (PROTONIX) EC tablet 40 mg  40 mg Oral Daily Smith, Rondell A, MD   40 mg at 08/31/23 0921   potassium chloride 10 mEq in 100 mL IVPB  10 mEq Intravenous Q1 Hr x 4 Smith, Rondell A, MD       rosuvastatin (CRESTOR) tablet 40 mg  40 mg Oral Daily Smith, Rondell A, MD   40 mg at 08/31/23 4696   sodium chloride flush (NS) 0.9 % injection 3 mL  3 mL Intravenous Q12H Smith, Rondell A, MD   3 mL at 08/31/23 1203   thiamine (VITAMIN B1) tablet 100 mg  100 mg Oral Daily Evlyn Kanner T, PA-C   100 mg at 08/31/23 2952   Or   thiamine (VITAMIN B1) injection 100 mg  100 mg Intravenous Daily Schuman, James T, PA-C       traZODone (DESYREL) tablet 50 mg  50 mg Oral QHS PRN Clydie Braun, MD       Current Outpatient Medications  Medication Sig Dispense Refill   allopurinol (ZYLOPRIM) 100 MG tablet Take 1  tablet (100 mg total) by mouth daily. 90 tablet 0   ALPRAZolam (XANAX) 0.5 MG tablet TAKE 1 TABLET BY MOUTH THREE TIMES A DAY AS NEEDED FOR ANXIETY 90 tablet 0   apixaban (ELIQUIS) 5 MG TABS tablet Take 1 tablet (5 mg total) by mouth 2 (two) times daily. 180 tablet 1   fenofibrate (TRICOR) 145 MG tablet Take 1 tablet by mouth once daily 90 tablet 0   folic acid (FOLVITE) 1 MG tablet Take 1 tablet (1 mg total) by mouth daily. 90 tablet 3   furosemide (LASIX) 40 MG tablet Take 1 tablet by mouth once daily 90 tablet 3   icosapent Ethyl (VASCEPA) 1 g capsule  Take 2 capsules (2 g total) by mouth 2 (two) times daily. 360 capsule 1   metoprolol succinate (TOPROL-XL) 50 MG 24 hr tablet Take 1 tablet (50 mg total) by mouth daily. Take with or immediately following a meal. 90 tablet 3   rosuvastatin (CRESTOR) 40 MG tablet Take 1 tablet (40 mg total) by mouth daily. 90 tablet 3   thiamine (VITAMIN B-1) 100 MG tablet Take 100 mg by mouth daily.     traZODone (DESYREL) 50 MG tablet TAKE 1/2 TO 1 (ONE-HALF TO ONE) TABLET BY MOUTH AT BEDTIME AS NEEDED FOR SLEEP 30 tablet 0   pantoprazole (PROTONIX) 40 MG tablet Take 1 tablet (40 mg total) by mouth daily. Patient needs follow up appointment for future refills. Please call [phone] to schedule an appointment. (Patient not taking: Reported on 08/31/2023) 30 tablet 0    Allergies as of 08/31/2023 - Review Complete 08/31/2023  Allergen Reaction Noted   Zetia [ezetimibe] Nausea And Vomiting 01/19/2018    Review of Systems:    Constitutional: No weight loss, fever, chills, weakness or fatigue HEENT: Eyes: No change in vision               Ears, Nose, Throat:  No change in hearing or congestion Skin: No rash or itching Cardiovascular: No chest pain, chest pressure or palpitations   Respiratory: No SOB or cough Gastrointestinal: See HPI and otherwise negative Genitourinary: No dysuria or change in urinary frequency Neurological: No headache, dizziness or syncope Musculoskeletal: No new muscle or joint pain Hematologic: No bleeding or bruising Psychiatric: No history of depression or anxiety     Physical Exam:  Vital signs in last 24 hours: Temp:  [97.8 F (36.6 C)-98.3 F (36.8 C)] 97.8 F (36.6 C) (12/22 1141) Pulse Rate:  [91-131] 120 (12/22 1200) Resp:  [14-39] 14 (12/22 1141) BP: (86-116)/(62-76) 103/69 (12/22 1200) SpO2:  [94 %-100 %] 98 % (12/22 1141) Weight:  [295 kg] 112 kg (12/22 0455)   Last BM recorded by nurses in past 5 days No data recorded  General:   Thin, chronically  ill-appearing, intoxicated male in no acute distress Head:  Normocephalic and atraumatic.  Poor dentition, dry mucosa Eyes: sclerae anicteric,conjunctive pale  Heart: Tachycardic, regular rhythm, systolic murmur Pulm: Clear anteriorly; no wheezing Abdomen:  Soft, Obese AB, Active bowel sounds. No tenderness, small periumbilical hernia. Without guarding and Without rebound, No organomegaly appreciated. Extremities:  With 1+ edema. Msk:  Symmetrical without gross deformities. Peripheral pulses intact.  Neurologic:  Alert and  oriented x4;  No focal deficits.  Skin:   Ecchymoses, no spider angiomas. Psychiatric:  Cooperative. Normal mood and affect.  LAB RESULTS: Recent Labs    08/31/23 0504  WBC 7.0  HGB 5.6*  HCT 21.2*  PLT 225   BMET Recent Labs  08/31/23 0504  NA 140  K 3.2*  CL 108  CO2 20*  GLUCOSE 111*  BUN 11  CREATININE 1.80*  CALCIUM 8.0*   LFT Recent Labs    08/31/23 0504  PROT 5.6*  ALBUMIN 2.5*  AST 29  ALT 16  ALKPHOS 57  BILITOT 0.4   PT/INR No results for input(s): "LABPROT", "INR" in the last 72 hours.  STUDIES: CT CHEST ABDOMEN PELVIS W CONTRAST Result Date: 08/31/2023 CLINICAL DATA:  Poly trauma. Status post fall striking head on sink. On anticoagulation therapy. EXAM: CT CHEST, ABDOMEN, AND PELVIS WITH CONTRAST TECHNIQUE: Multidetector CT imaging of the chest, abdomen and pelvis was performed following the standard protocol during bolus administration of intravenous contrast. RADIATION DOSE REDUCTION: This exam was performed according to the departmental dose-optimization program which includes automated exposure control, adjustment of the mA and/or kV according to patient size and/or use of iterative reconstruction technique. CONTRAST:  75mL OMNIPAQUE IOHEXOL 350 MG/ML SOLN COMPARISON:  12/15/2020 FINDINGS: CT CHEST FINDINGS Cardiovascular: Heart size is upper limits of normal. There is no pericardial effusion. Mild aortic atherosclerosis. Lad  coronary artery calcifications. Mediastinum/Nodes: No enlarged mediastinal, hilar, or axillary lymph nodes. Thyroid gland, trachea, and esophagus demonstrate no significant findings. Lungs/Pleura: There is a small right pleural effusion, image 52/3. There is ground-glass attenuation and subpleural consolidation within the right lower lobe, image 138/7. No pneumothorax identified. Stable left upper lobe lung nodule measuring 4 mm, image 51/7. Musculoskeletal: No chest wall mass or suspicious bone lesions identified. No fractures. CT ABDOMEN PELVIS FINDINGS Hepatobiliary: No hepatic injury or perihepatic hematoma. Gallbladder is unremarkable. Pancreas: Unremarkable. No pancreatic ductal dilatation or surrounding inflammatory changes. Spleen: No splenic injury or perisplenic hematoma. Adrenals/Urinary Tract: No adrenal hemorrhage or renal injury identified. Bladder is unremarkable. Upper pole left kidney cyst measures 10 mm and 21 Hounsfield units compatible with a benign Bosniak class 2 cyst. No follow-up imaging recommended. Stomach/Bowel: Stomach is within normal limits. Appendix appears normal. No evidence of bowel wall thickening, distention, or inflammatory changes. Vascular/Lymphatic: Aortic atherosclerosis. No enlarged abdominal or pelvic lymph nodes. Reproductive: Mild prostate gland enlargement Other: No free fluid or fluid collections. Fat containing umbilical hernia. Musculoskeletal: Areas of subcutaneous soft tissue stranding are noted along bilateral flanks and overlying both hips. No acute or suspicious osseous findings. L4-5 degenerative disc disease. IMPRESSION: 1. Small right pleural effusion. 2. Ground-glass attenuation and subpleural consolidation within the right lower lobe. Differential considerations include pneumonia, aspiration, or in the acute posttraumatic setting pulmonary contusion. No associated overlying rib fractures identified. 3. Coronary artery calcifications. 4. Stable 4 mm left  upper lobe lung nodule. No follow-up imaging recommended. 5. Areas of subcutaneous soft tissue stranding are noted along bilateral flanks and overlying both hips. Findings may reflect anasarca or areas of superficial contusion. Clinical correlation advised. 6.  Aortic Atherosclerosis (ICD10-I70.0). Electronically Signed   By: Signa Kell M.D.   On: 08/31/2023 07:07   CT HEAD WO CONTRAST ( ) Result Date: 08/31/2023 CLINICAL DATA:  Blunt poly trauma.  Head trauma on Eliquis. EXAM: CT HEAD WITHOUT CONTRAST CT CERVICAL SPINE WITHOUT CONTRAST TECHNIQUE: Multidetector CT imaging of the head and cervical spine was performed following the standard protocol without intravenous contrast. Multiplanar CT image reconstructions of the cervical spine were also generated. RADIATION DOSE REDUCTION: This exam was performed according to the departmental dose-optimization program which includes automated exposure control, adjustment of the mA and/or kV according to patient size and/or use of iterative reconstruction technique. COMPARISON:  12/15/2020 FINDINGS:  CT HEAD FINDINGS Brain: No evidence of acute infarction, hemorrhage, hydrocephalus, extra-axial collection or mass lesion/mass effect. 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 Encephalomalacia in the right frontal lobe beneath a burr hole for presumed ventricular drain. No new finding Vascular: Right-sided aneurysm clip as noted above. Atheromatous calcification. Skull: Unremarkable right pterional craniotomy Sinuses/Orbits: No evidence of injury CT CERVICAL SPINE FINDINGS Alignment: Normal. Skull base and vertebrae: No acute fracture. No primary bone lesion or focal pathologic process. Soft tissues and spinal canal: No prevertebral fluid or swelling. No visible canal hematoma. Disc levels:  No significant degeneration. Upper chest: Reported separately IMPRESSION: No evidence of acute intracranial or cervical spine injury. Electronically  Signed   By: Tiburcio Pea M.D.   On: 08/31/2023 06:55   CT Cervical Spine Wo Contrast Result Date: 08/31/2023 CLINICAL DATA:  Blunt poly trauma.  Head trauma on Eliquis. EXAM: CT HEAD WITHOUT CONTRAST CT CERVICAL SPINE WITHOUT CONTRAST TECHNIQUE: Multidetector CT imaging of the head and cervical spine was performed following the standard protocol without intravenous contrast. Multiplanar CT image reconstructions of the cervical spine were also generated. RADIATION DOSE REDUCTION: This exam was performed according to the departmental dose-optimization program which includes automated exposure control, adjustment of the mA and/or kV according to patient size and/or use of iterative reconstruction technique. COMPARISON:  12/15/2020 FINDINGS: CT HEAD FINDINGS Brain: No evidence of acute infarction, hemorrhage, hydrocephalus, extra-axial collection or mass lesion/mass effect. 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 Encephalomalacia in the right frontal lobe beneath a burr hole for presumed ventricular drain. No new finding Vascular: Right-sided aneurysm clip as noted above. Atheromatous calcification. Skull: Unremarkable right pterional craniotomy Sinuses/Orbits: No evidence of injury CT CERVICAL SPINE FINDINGS Alignment: Normal. Skull base and vertebrae: No acute fracture. No primary bone lesion or focal pathologic process. Soft tissues and spinal canal: No prevertebral fluid or swelling. No visible canal hematoma. Disc levels:  No significant degeneration. Upper chest: Reported separately IMPRESSION: No evidence of acute intracranial or cervical spine injury. Electronically Signed   By: Tiburcio Pea M.D.   On: 08/31/2023 06:55      Impression    Iron deficiency anemia Hemoglobin 5.6, a year ago was 14 Reports of melena in last 1 to 2 months, has been off pantoprazole, FOBT negative in the ER On Eliquis last dose yesterday afternoon Also reporting dysphagia possible  oropharyngeal dysphagia, has never had EGD or colonoscopy In setting of alcohol abuse  Alcohol abuse with intoxication Ethanol level 200  A-fib on Eliquis Hold eliquis, last dose 12/21 in the afternoon  History of elevated LFTs Likely secondary to alcohol use previous negative hepatitis panel  Fall due to intoxication Negative CT head, chest abdomen pelvis no signs of hematoma CPK low   Principal Problem:   Iron deficiency anemia    LOS: 0 days     Plan   With melena, alcohol use, dysphagia with profound anemia we will plan to proceed with EGD tomorrow with Dr. Tomasa Rand to rule out alcoholic gastritis/peptic ulcer disease, esophagitis, malignancy I do not think patient would be able to prep at this time, if EGD is unremarkable may need to proceed with colonoscopy in setting of IDA. -Continue to hold Eliquis, primary team discussed placing patient on heparin due to history of A-fib aneurysms, if patient is certain heparin would need to be held 4 hours prior to EGD. -Protonix 40 mg IV BID. -Clear liquid diet, NPO at midnight. --Continue to monitor H&H  with transfusion as needed to maintain hemoglobin greater than 7. - I thoroughly discussed the procedure to include nature, alternatives, benefits, and risks including but not limited to bleeding, perforation, infection, anesthesia/cardiac and pulmonary complications. Patient provides understanding and gave verbal consent to proceed.  Currently patient is consentable but may also be starting withdrawals with tachycardia and diaphoresis. CIWA protocol Thiamine, multivitamin folic acid Alcohol cessation discussion with the patient Patient also has some symptoms of possible oropharyngeal dysphagia consider modified barium swallow  Thank you for your kind consultation, we will continue to follow.   Doree Albee  08/31/2023, 12:10 PM  Gastroenterology attending:  I have also seen and evaluated this patient in person.  I agree  with Ms. Collier's note and assessment and plan.  I have performed the majority of the medical decision making which is of high complexity.  This man is an alcoholic with iron deficiency anemia and heme-negative stool.  There has been some recent suggestion of upper GI bleeding with melena.  Nutritional component of anemia certainly possible as well in this setting.  We are starting with an EGD.  It could explain the iron deficiency anemia completely though he is most likely going to need a colonoscopy.  The question of that would be timing.  We did not think he be ready to prep and wanted to initiate the workup.  Iva Boop, MD, [facility] See Loretha Stapler on call - gastroenterology for best contact person 08/31/2023 3:29 PM

## 2023-08-31 NOTE — ED Triage Notes (Signed)
Pt to ED via [facility] from home. Pt was getting up from toilet and fell backward hitting back of head on sink. Pt takes eliquis. Denies LOC. Pt c/o dizziness.  EMS VS 97% 114 Cbg 211 146/84 97.4

## 2023-08-31 NOTE — ED Provider Notes (Signed)
[place] EMERGENCY DEPARTMENT AT [facility] Provider Note   CSN: [id number] Arrival date & time: 08/31/23  0453     History  Chief Complaint  Patient presents with   Daniel Butler is a 57 y.o. male.  The history is provided by the patient and medical records.  Fall   57 year old male with history of CHF, alcohol abuse, anxiety, A-fib on Eliquis, hypertension, presenting to the ED after a fall.  Patient had been drinking alcohol tonight (3 beers, 2 shots, etc) and when he stood up from the toilet he fell backwards and struck his head on the sink.  There was no loss of consciousness.  No significant pain voiced.  Home Medications Prior to Admission medications   Medication Sig Start Date End Date Taking? Authorizing Provider  allopurinol (ZYLOPRIM) 100 MG tablet Take 1 tablet (100 mg total) by mouth daily. 06/16/23   Olive Bass, FNP  ALPRAZolam Prudy Feeler) 0.5 MG tablet TAKE 1 TABLET BY MOUTH THREE TIMES A DAY AS NEEDED FOR ANXIETY 08/14/23   Olive Bass, FNP  apixaban (ELIQUIS) 5 MG TABS tablet Take 1 tablet (5 mg total) by mouth 2 (two) times daily. 01/10/23   Chrystie Nose, MD  fenofibrate (TRICOR) 145 MG tablet Take 1 tablet by mouth once daily 07/22/23   Olive Bass, FNP  folic acid (FOLVITE) 1 MG tablet Take 1 tablet (1 mg total) by mouth daily. 06/16/23   Olive Bass, FNP  furosemide (LASIX) 40 MG tablet Take 1 tablet by mouth once daily 07/23/23   Hilty, Lisette Abu, MD  icosapent Ethyl (VASCEPA) 1 g capsule Take 2 capsules (2 g total) by mouth 2 (two) times daily. 03/25/23   Hilty, Lisette Abu, MD  metoprolol succinate (TOPROL-XL) 50 MG 24 hr tablet Take 1 tablet (50 mg total) by mouth daily. Take with or immediately following a meal. 07/22/23   Hilty, Lisette Abu, MD  pantoprazole (PROTONIX) 40 MG tablet Take 1 tablet (40 mg total) by mouth daily. Patient needs follow up appointment for future refills. Please call  [phone] to schedule an appointment. 06/16/23   Meredith Pel, NP  rosuvastatin (CRESTOR) 40 MG tablet Take 1 tablet (40 mg total) by mouth daily. 07/22/23   Chrystie Nose, MD  thiamine (VITAMIN B-1) 100 MG tablet Take 100 mg by mouth daily.    [provider]  traZODone (DESYREL) 50 MG tablet TAKE 1/2 TO 1 (ONE-HALF TO ONE) TABLET BY MOUTH AT BEDTIME AS NEEDED FOR SLEEP 08/14/23   Olive Bass, FNP      Allergies    Zetia [ezetimibe]    Review of Systems   Review of Systems  Constitutional:        Fall on thinners, EtOH  All other systems reviewed and are negative.   Physical Exam Updated Vital Signs BP 97/66   Pulse (!) 105   Temp 98.2 F (36.8 C)   Resp 16   Ht 6\' 1"  (1.854 m)   Wt 112 kg   SpO2 100%   BMI 32.58 kg/m   Physical Exam Vitals and nursing note reviewed.  Constitutional:      Appearance: He is well-developed.     Comments: Pale  appearing  HENT:     Head: Normocephalic and atraumatic.     Comments: No visible scalp laceration    Mouth/Throat:     Comments: Breath smells of EtOH Eyes:     Conjunctiva/sclera:  Conjunctivae normal.     Pupils: Pupils are equal, round, and reactive to light.  Cardiovascular:     Rate and Rhythm: Normal rate and regular rhythm.     Heart sounds: Normal heart sounds.  Pulmonary:     Effort: Pulmonary effort is normal. No respiratory distress.     Breath sounds: Normal breath sounds. No rhonchi.  Abdominal:     General: Bowel sounds are normal.     Palpations: Abdomen is soft.     Tenderness: There is no rebound.     Comments: Non-tender, no bruising noted to abdomen/flank on initial exam  Genitourinary:    Comments: Exam chaperoned by RN No blood on DRE, hemoccult negative Musculoskeletal:        General: Normal range of motion.     Cervical back: Normal range of motion.  Skin:    General: Skin is warm and dry.  Neurological:     Mental Status: He is alert and oriented to person, place,  and time.     Comments: Intoxicated but able to answer questions and follow commands, no focal deficits     ED Results / Procedures / Treatments   Labs (all labs ordered are listed, but only abnormal results are displayed) Labs Reviewed  CBC WITH DIFFERENTIAL/PLATELET - Abnormal; Notable for the following components:      Result Value   RBC 2.64 (*)    Hemoglobin 5.6 (*)    HCT 21.2 (*)    MCH 21.2 (*)    MCHC 26.4 (*)    RDW 19.3 (*)    nRBC 0.4 (*)    All other components within normal limits  ETHANOL - Abnormal; Notable for the following components:   Alcohol, Ethyl (B) 287 (*)    All other components within normal limits  COMPREHENSIVE METABOLIC PANEL - Abnormal; Notable for the following components:   Potassium 3.2 (*)    CO2 20 (*)    Glucose, Bld 111 (*)    Creatinine, Ser 1.80 (*)    Calcium 8.0 (*)    Total Protein 5.6 (*)    Albumin 2.5 (*)    GFR, Estimated 43 (*)    All other components within normal limits  IRON AND TIBC - Abnormal; Notable for the following components:   Iron 13 (*)    TIBC 526 (*)    Saturation Ratios 3 (*)    All other components within normal limits  FERRITIN - Abnormal; Notable for the following components:   Ferritin 2 (*)    All other components within normal limits  RETICULOCYTES - Abnormal; Notable for the following components:   RBC. 2.64 (*)    Immature Retic Fract 29.6 (*)    All other components within normal limits  VITAMIN B12  FOLATE  POC OCCULT BLOOD, ED  TYPE AND SCREEN  PREPARE RBC (CROSSMATCH)    EKG None  Radiology No results found.  Procedures Procedures    Medications Ordered in ED Medications - No data to display  ED Course/ Medical Decision Making/ A&P                                 Medical Decision Making Amount and/or Complexity of Data Reviewed Labs: ordered. Radiology: ordered and independent interpretation performed. ECG/medicine tests: ordered and independent interpretation  performed.  Risk Prescription drug management.   57 year old male presenting to the ED after a fall.  Had been  drinking alcohol tonight when he fell getting up from the toilet, struck his head on the sink.  No reported loss of consciousness.  He is quite pale in appearance, breath smells of alcohol.  He is able to answer questions and follow commands.  No focal deficits.  He does not have any external wounds.  Will check labs, CT head/neck.    5:42 AM Family at bedside, health overall declining recently.  Had some abnormal labs recently at cardiologist office, unsure exactly what values.  However refused admission at that time.  Has been having issues with lower extremity edema.  He denies any recent GI bleeding but did have some a few months ago and had a period of time with black stools but has not noticed this recently.  Hemoccult today is negative.  His indices are quite low, may indicate iron deficiency.  Will send anemia panel, type and cross 1 unit.   6:15 AM Patient re-checked-- BP a little soft but remains stable.  Hemoglobin today 5.6.  last on record was 14.8.  Wife reports abnormal labs recently but unable to give specific values.  Will get trauma scans to evaluate for potential internal bleeding given fall and alcohol intoxication.  Ethanol 287.  Anticipate he will need admission once CT's complete.  6:33 AM Care signed out to oncoming provider.  Anticipate admission.  Final Clinical Impression(s) / ED Diagnoses Final diagnoses:  Fall, initial encounter  Anemia, unspecified type    Rx / DC Orders ED Discharge Orders     None         Garlon Hatchet, PA-C 08/31/23 5009    Glendora Score, MD 09/02/23 [id number]

## 2023-08-31 NOTE — ED Notes (Signed)
Trauma Event Note   Non activated trauma pt, 57 yo male admitting to ED with a fall with head injury, on thinners. Escorted pt to CT and back. Hemoglobin low this morning, 5.6 - blood administration began upon return to treatment room. Pending admission.    Amarra Sawyer O Ronnetta Currington  Trauma Response RN  Please call TRN at [phone] for further assistance.

## 2023-09-01 ENCOUNTER — Encounter (HOSPITAL_COMMUNITY): Payer: Self-pay | Admitting: Anesthesiology

## 2023-09-01 ENCOUNTER — Encounter (HOSPITAL_COMMUNITY)
Admission: EM | Disposition: A | Discharge status: Home / Self Care | Payer: Self-pay | Source: Home / Self Care | Attending: Family Medicine

## 2023-09-01 ENCOUNTER — Encounter (HOSPITAL_COMMUNITY): Payer: Self-pay | Admitting: Internal Medicine

## 2023-09-01 DIAGNOSIS — I5023 Acute on chronic systolic (congestive) heart failure: Secondary | ICD-10-CM

## 2023-09-01 DIAGNOSIS — F17210 Nicotine dependence, cigarettes, uncomplicated: Secondary | ICD-10-CM | POA: Diagnosis present

## 2023-09-01 DIAGNOSIS — W1812XA Fall from or off toilet with subsequent striking against object, initial encounter: Secondary | ICD-10-CM | POA: Diagnosis present

## 2023-09-01 DIAGNOSIS — K2289 Other specified disease of esophagus: Secondary | ICD-10-CM | POA: Diagnosis not present

## 2023-09-01 DIAGNOSIS — N179 Acute kidney failure, unspecified: Secondary | ICD-10-CM | POA: Diagnosis not present

## 2023-09-01 DIAGNOSIS — F32A Depression, unspecified: Secondary | ICD-10-CM | POA: Diagnosis present

## 2023-09-01 DIAGNOSIS — R9431 Abnormal electrocardiogram [ECG] [EKG]: Secondary | ICD-10-CM | POA: Diagnosis not present

## 2023-09-01 DIAGNOSIS — E8809 Other disorders of plasma-protein metabolism, not elsewhere classified: Secondary | ICD-10-CM | POA: Diagnosis present

## 2023-09-01 DIAGNOSIS — R1310 Dysphagia, unspecified: Secondary | ICD-10-CM | POA: Diagnosis not present

## 2023-09-01 DIAGNOSIS — F10129 Alcohol abuse with intoxication, unspecified: Secondary | ICD-10-CM | POA: Diagnosis not present

## 2023-09-01 DIAGNOSIS — W19XXXA Unspecified fall, initial encounter: Secondary | ICD-10-CM | POA: Diagnosis not present

## 2023-09-01 DIAGNOSIS — Y908 Blood alcohol level of 240 mg/100 ml or more: Secondary | ICD-10-CM | POA: Diagnosis present

## 2023-09-01 DIAGNOSIS — Y92009 Unspecified place in unspecified non-institutional (private) residence as the place of occurrence of the external cause: Secondary | ICD-10-CM | POA: Diagnosis not present

## 2023-09-01 DIAGNOSIS — D132 Benign neoplasm of duodenum: Secondary | ICD-10-CM | POA: Diagnosis not present

## 2023-09-01 DIAGNOSIS — E876 Hypokalemia: Secondary | ICD-10-CM | POA: Diagnosis not present

## 2023-09-01 DIAGNOSIS — I110 Hypertensive heart disease with heart failure: Secondary | ICD-10-CM | POA: Diagnosis present

## 2023-09-01 DIAGNOSIS — E7800 Pure hypercholesterolemia, unspecified: Secondary | ICD-10-CM | POA: Diagnosis present

## 2023-09-01 DIAGNOSIS — E781 Pure hyperglyceridemia: Secondary | ICD-10-CM | POA: Diagnosis present

## 2023-09-01 DIAGNOSIS — I4891 Unspecified atrial fibrillation: Secondary | ICD-10-CM | POA: Diagnosis not present

## 2023-09-01 DIAGNOSIS — K3189 Other diseases of stomach and duodenum: Secondary | ICD-10-CM | POA: Diagnosis not present

## 2023-09-01 DIAGNOSIS — D649 Anemia, unspecified: Secondary | ICD-10-CM | POA: Diagnosis not present

## 2023-09-01 DIAGNOSIS — K921 Melena: Secondary | ICD-10-CM | POA: Diagnosis not present

## 2023-09-01 DIAGNOSIS — Y92091 Bathroom in other non-institutional residence as the place of occurrence of the external cause: Secondary | ICD-10-CM | POA: Diagnosis not present

## 2023-09-01 DIAGNOSIS — D124 Benign neoplasm of descending colon: Secondary | ICD-10-CM | POA: Diagnosis not present

## 2023-09-01 DIAGNOSIS — K429 Umbilical hernia without obstruction or gangrene: Secondary | ICD-10-CM | POA: Diagnosis present

## 2023-09-01 DIAGNOSIS — G4700 Insomnia, unspecified: Secondary | ICD-10-CM | POA: Diagnosis present

## 2023-09-01 DIAGNOSIS — K317 Polyp of stomach and duodenum: Secondary | ICD-10-CM | POA: Diagnosis not present

## 2023-09-01 DIAGNOSIS — D509 Iron deficiency anemia, unspecified: Secondary | ICD-10-CM | POA: Diagnosis not present

## 2023-09-01 DIAGNOSIS — I4819 Other persistent atrial fibrillation: Secondary | ICD-10-CM | POA: Diagnosis present

## 2023-09-01 DIAGNOSIS — K5730 Diverticulosis of large intestine without perforation or abscess without bleeding: Secondary | ICD-10-CM | POA: Diagnosis not present

## 2023-09-01 DIAGNOSIS — D122 Benign neoplasm of ascending colon: Secondary | ICD-10-CM | POA: Diagnosis not present

## 2023-09-01 DIAGNOSIS — I69254 Hemiplegia and hemiparesis following other nontraumatic intracranial hemorrhage affecting left non-dominant side: Secondary | ICD-10-CM | POA: Diagnosis not present

## 2023-09-01 DIAGNOSIS — I428 Other cardiomyopathies: Secondary | ICD-10-CM | POA: Diagnosis present

## 2023-09-01 DIAGNOSIS — F4310 Post-traumatic stress disorder, unspecified: Secondary | ICD-10-CM | POA: Diagnosis present

## 2023-09-01 DIAGNOSIS — D123 Benign neoplasm of transverse colon: Secondary | ICD-10-CM | POA: Diagnosis not present

## 2023-09-01 DIAGNOSIS — Z7901 Long term (current) use of anticoagulants: Secondary | ICD-10-CM | POA: Diagnosis not present

## 2023-09-01 LAB — COMPREHENSIVE METABOLIC PANEL
ALT: 12 U/L (ref 0–44)
AST: 18 U/L (ref 15–41)
Albumin: 2 g/dL — ABNORMAL LOW (ref 3.5–5.0)
Alkaline Phosphatase: 58 U/L (ref 38–126)
Anion gap: 6 (ref 5–15)
BUN: 10 mg/dL (ref 6–20)
CO2: 22 mmol/L (ref 22–32)
Calcium: 7.3 mg/dL — ABNORMAL LOW (ref 8.9–10.3)
Chloride: 111 mmol/L (ref 98–111)
Creatinine, Ser: 1.69 mg/dL — ABNORMAL HIGH (ref 0.61–1.24)
GFR, Estimated: 46 mL/min — ABNORMAL LOW (ref >60)
Glucose, Bld: 93 mg/dL (ref 70–99)
Potassium: 3.4 mmol/L — ABNORMAL LOW (ref 3.5–5.1)
Sodium: 139 mmol/L (ref 135–145)
Total Bilirubin: 1 mg/dL (ref <1.2)
Total Protein: 4.6 g/dL — ABNORMAL LOW (ref 6.5–8.1)

## 2023-09-01 LAB — CBC
HCT: 24.4 % — ABNORMAL LOW (ref 39.0–52.0)
Hemoglobin: 7.2 g/dL — ABNORMAL LOW (ref 13.0–17.0)
MCH: 23.2 pg — ABNORMAL LOW (ref 26.0–34.0)
MCHC: 29.5 g/dL — ABNORMAL LOW (ref 30.0–36.0)
MCV: 78.7 fL — ABNORMAL LOW (ref 80.0–100.0)
Platelets: 165 10*3/uL (ref 150–400)
RBC: 3.1 MIL/uL — ABNORMAL LOW (ref 4.22–5.81)
RDW: 18.4 % — ABNORMAL HIGH (ref 11.5–15.5)
WBC: 8 10*3/uL (ref 4.0–10.5)
nRBC: 0.5 % — ABNORMAL HIGH (ref 0.0–0.2)

## 2023-09-01 LAB — PREPARE RBC (CROSSMATCH)

## 2023-09-01 LAB — HEMOGLOBIN AND HEMATOCRIT, BLOOD
HCT: 23.6 % — ABNORMAL LOW (ref 39.0–52.0)
Hemoglobin: 6.8 g/dL — CL (ref 13.0–17.0)

## 2023-09-01 SURGERY — CANCELLED PROCEDURE

## 2023-09-01 MED ORDER — MAGNESIUM SULFATE IN D5W 1-5 GM/100ML-% IV SOLN
1.0000 g | Freq: Once | INTRAVENOUS | Status: AC
  Administered 2023-09-01: 1 g via INTRAVENOUS
  Filled 2023-09-01: qty 100

## 2023-09-01 MED ORDER — SODIUM CHLORIDE 0.9 % IV SOLN: INTRAVENOUS | Status: AC

## 2023-09-01 MED ORDER — METOPROLOL TARTRATE 5 MG/5ML IV SOLN
5.0000 mg | Freq: Once | INTRAVENOUS | Status: AC
  Administered 2023-09-01: 5 mg via INTRAVENOUS
  Filled 2023-09-01: qty 5

## 2023-09-01 MED ORDER — PEG-KCL-NACL-NASULF-NA ASC-C 100 G PO SOLR
0.5000 | Freq: Once | ORAL | Status: AC
  Administered 2023-09-01: 100 g via ORAL
  Filled 2023-09-01: qty 1

## 2023-09-01 MED ORDER — SODIUM CHLORIDE 0.9% IV SOLUTION: Freq: Once | INTRAVENOUS | Status: AC

## 2023-09-01 MED ORDER — CHLORDIAZEPOXIDE HCL 25 MG PO CAPS
25.0000 mg | ORAL_CAPSULE | Freq: Three times a day (TID) | ORAL | Status: AC
  Administered 2023-09-02 – 2023-09-03 (×3): 25 mg via ORAL
  Filled 2023-09-01 (×3): qty 1

## 2023-09-01 MED ORDER — PEG-KCL-NACL-NASULF-NA ASC-C 100 G PO SOLR
1.0000 | Freq: Once | ORAL | Status: DC
Start: 2023-09-01 — End: 2023-09-01

## 2023-09-01 MED ORDER — CHLORDIAZEPOXIDE HCL 25 MG PO CAPS: 25.0000 mg | ORAL_CAPSULE | ORAL | Status: DC

## 2023-09-01 MED ORDER — PEG-KCL-NACL-NASULF-NA ASC-C 100 G PO SOLR
0.5000 | Freq: Once | ORAL | Status: AC
  Administered 2023-09-01: 100 g via ORAL

## 2023-09-01 MED ORDER — CHLORDIAZEPOXIDE HCL 25 MG PO CAPS: 25.0000 mg | ORAL_CAPSULE | Freq: Every day | ORAL | Status: DC

## 2023-09-01 MED ORDER — METOPROLOL SUCCINATE ER 50 MG PO TB24
50.0000 mg | ORAL_TABLET | Freq: Every day | ORAL | Status: DC
  Administered 2023-09-01 – 2023-09-03 (×2): 50 mg via ORAL
  Filled 2023-09-01 (×3): qty 1

## 2023-09-01 MED ORDER — METOPROLOL TARTRATE 5 MG/5ML IV SOLN
5.0000 mg | Freq: Four times a day (QID) | INTRAVENOUS | Status: DC | PRN
  Administered 2023-09-01: 5 mg via INTRAVENOUS
  Filled 2023-09-01 (×3): qty 5

## 2023-09-01 MED ORDER — CHLORDIAZEPOXIDE HCL 25 MG PO CAPS
25.0000 mg | ORAL_CAPSULE | Freq: Four times a day (QID) | ORAL | Status: AC
  Administered 2023-09-01 (×3): 25 mg via ORAL
  Filled 2023-09-01 (×3): qty 5

## 2023-09-01 MED ORDER — POTASSIUM CHLORIDE CRYS ER 20 MEQ PO TBCR
40.0000 meq | EXTENDED_RELEASE_TABLET | Freq: Once | ORAL | Status: AC
  Administered 2023-09-01: 40 meq via ORAL
  Filled 2023-09-01: qty 2

## 2023-09-01 SURGICAL SUPPLY — 14 items

## 2023-09-01 NOTE — Progress Notes (Signed)
PROGRESS NOTE    Daniel Butler  FAO:[id number] DOB: Feb 01, 1966 DOA: 08/31/2023 PCP: Olive Bass, FNP   Brief Narrative: Daniel Butler is a 57 y.o. 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  Patient presented secondary to after sudden loss of strength in his legs.  Patient found to have evidence of acute alcohol intoxication but also found to have profound anemia of unknown etiology complicated by alcohol use, history of melena, Eliquis use.  Gastroenterology consulted for endoscopic evaluation.  Patient transfused 1 unit of PRBC on admission.   Assessment and Plan:  Fall Patient experienced a fall at home in setting of acute alcohol intoxication complicated by associated anemia.  Patient underwent CT of the head, cervical spine, chest, abdomen/pelvis without evidence of acute fracture or hematoma.  PT ordered for evaluation.  Symptomatic anemia Acute anemia Iron deficiency anemia Unclear etiology but patient is high risk for GI bleeding secondary to melena history, alcohol use complicated by Eliquis use.  Imaging negative for hematoma in setting of fall.  Hemoglobin of 5.6 g/dL on admission requiring transfusion of 2 units of PRBC.  Posttransfusion hemoglobin of 8.0 g/dL.  Hemoglobin trended down to 7.2 g/dL this morning.  GI consulted with plan for upper and lower GI endoscopy. -Repeat hemoglobin this afternoon  Persistent atrial fibrillation with RVR Patient is managed on metoprolol as an outpatient and is also on Eliquis for stroke prevention.  Patient developed RVR while in the hospital likely secondary to missed metoprolol dosing.  Endoscopy procedure postponed secondary to RVR. -Resume home metoprolol succinate 50 mg daily -Continue telemetry  Dysphagia Patient with concerns for food and pills getting  stuck. Speech therapy consulted and GI consulted. Plan for upper endoscopy by GI to evaluate for etiology.  AKI Baseline creatinine appears to be around 1.2-1.4 from metabolic panel one year prior. Creatinine of 1.8 on admission with improvement to 1.69. Possibly related to hypervolemia -Repeat metabolic panel in AM  Acute on chronic HFrEF LVEF of 45-50%. Present on admission with evidence of lower extremity edema. Patient given Lasix IV on admission. BNP elevated at 200. Currently on room air.  Hypokalemia Mild. -Potassium supplementation  Alcohol abuse with acute intoxication Alcohol level of 287 on admission. Patient reports frequent alcohol intake and has been cutting down. Denies history of alcohol withdrawal but notes history of "shakes." He has been managed on Ativan while admitted in the past, in addition to Librium. 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 Noted.  Tobacco use Counseled on admission.  Umbilical hernia Reducible. No symptoms.   DVT prophylaxis: SCDs Code Status:   Code Status: Full Code Family Communication: Wife at bedside Disposition Plan: Discharge home likely in 2-3 days pending GI recommendations/management and if patient does not develop worsening alcohol withdrawal symptoms   Consultants:  Iron Post Gastroenterology  Procedures:  None  Antimicrobials: None    Subjective: Patient without specific medical concerns this morning.  Objective: BP 108/65   Pulse 85   Temp 98.9 F (37.2 C) (Oral)   Resp 18   Ht 6\' 1"  (1.854 m)   Wt 118.9 kg   SpO2 99%   BMI 34.58 kg/m   Examination:  General exam: Appears calm and comfortable Respiratory system: Clear to auscultation. Respiratory effort normal. Cardiovascular system: S1 & S2 heard, RRR. No murmurs. Gastrointestinal system: Abdomen  is nondistended, soft and nontender. Normal bowel sounds heard. Central  nervous system: Alert and oriented. No focal neurological deficits. Musculoskeletal: No calf tenderness Skin: No cyanosis. No rashes Psychiatry: Judgement and insight appear normal. Mood & affect appropriate.    Data Reviewed: I have personally reviewed following labs and imaging studies  CBC Lab Results  Component Value Date   WBC 8.0 09/01/2023   RBC 3.10 (L) 09/01/2023   HGB 7.2 (L) 09/01/2023   HCT 24.4 (L) 09/01/2023   MCV 78.7 (L) 09/01/2023   MCH 23.2 (L) 09/01/2023   PLT 165 09/01/2023   MCHC 29.5 (L) 09/01/2023   RDW 18.4 (H) 09/01/2023   LYMPHSABS 2.2 08/31/2023   MONOABS 0.5 08/31/2023   EOSABS 0.2 08/31/2023   BASOSABS 0.0 08/31/2023     Last metabolic panel Lab Results  Component Value Date   NA 139 09/01/2023   K 3.4 (L) 09/01/2023   CL 111 09/01/2023   CO2 22 09/01/2023   BUN 10 09/01/2023   CREATININE 1.69 (H) 09/01/2023   GLUCOSE 93 09/01/2023   GFRNONAA 46 (L) 09/01/2023   GFRAA >60 02/16/2016   CALCIUM 7.3 (L) 09/01/2023   PHOS 3.5 08/31/2023   PROT 4.6 (L) 09/01/2023   ALBUMIN 2.0 (L) 09/01/2023   LABGLOB 2.5 04/22/2022   AGRATIO 1.5 04/22/2022   BILITOT 1.0 09/01/2023   ALKPHOS 58 09/01/2023   AST 18 09/01/2023   ALT 12 09/01/2023   ANIONGAP 6 09/01/2023    GFR: Estimated Creatinine Clearance: 64.4 mL/min (A) (by C-G formula based on SCr of 1.69 mg/dL (H)).  No results found for this or any previous visit (from the past 240 hours).    Radiology Studies: CT CHEST ABDOMEN PELVIS W CONTRAST Result Date: 08/31/2023 CLINICAL DATA:  Poly trauma. Status post fall striking head on sink. On anticoagulation therapy. EXAM: CT CHEST, ABDOMEN, AND PELVIS WITH CONTRAST TECHNIQUE: Multidetector CT imaging of the chest, abdomen and pelvis was performed following the standard protocol during bolus administration of intravenous contrast. RADIATION DOSE REDUCTION: This exam was performed according to the departmental dose-optimization program which  includes automated exposure control, adjustment of the mA and/or kV according to patient size and/or use of iterative reconstruction technique. CONTRAST:  75mL OMNIPAQUE IOHEXOL 350 MG/ML SOLN COMPARISON:  12/15/2020 FINDINGS: CT CHEST FINDINGS Cardiovascular: Heart size is upper limits of normal. There is no pericardial effusion. Mild aortic atherosclerosis. Lad coronary artery calcifications. Mediastinum/Nodes: No enlarged mediastinal, hilar, or axillary lymph nodes. Thyroid gland, trachea, and esophagus demonstrate no significant findings. Lungs/Pleura: There is a small right pleural effusion, image 52/3. There is ground-glass attenuation and subpleural consolidation within the right lower lobe, image 138/7. No pneumothorax identified. Stable left upper lobe lung nodule measuring 4 mm, image 51/7. Musculoskeletal: No chest wall mass or suspicious bone lesions identified. No fractures. CT ABDOMEN PELVIS FINDINGS Hepatobiliary: No hepatic injury or perihepatic hematoma. Gallbladder is unremarkable. Pancreas: Unremarkable. No pancreatic ductal dilatation or surrounding inflammatory changes. Spleen: No splenic injury or perisplenic hematoma. Adrenals/Urinary Tract: No adrenal hemorrhage or renal injury identified. Bladder is unremarkable. Upper pole left kidney cyst measures 10 mm and 21 Hounsfield units compatible with a benign Bosniak class 2 cyst. No follow-up imaging recommended. Stomach/Bowel: Stomach is within normal limits. Appendix appears normal. No evidence of bowel wall thickening, distention, or inflammatory changes. Vascular/Lymphatic: Aortic atherosclerosis. No enlarged abdominal or pelvic lymph nodes. Reproductive: Mild prostate gland enlargement Other: No free fluid or fluid collections. Fat containing umbilical hernia. Musculoskeletal: Areas of subcutaneous  soft tissue stranding are noted along bilateral flanks and overlying both hips. No acute or suspicious osseous findings. L4-5 degenerative disc  disease. IMPRESSION: 1. Small right pleural effusion. 2. Ground-glass attenuation and subpleural consolidation within the right lower lobe. Differential considerations include pneumonia, aspiration, or in the acute posttraumatic setting pulmonary contusion. No associated overlying rib fractures identified. 3. Coronary artery calcifications. 4. Stable 4 mm left upper lobe lung nodule. No follow-up imaging recommended. 5. Areas of subcutaneous soft tissue stranding are noted along bilateral flanks and overlying both hips. Findings may reflect anasarca or areas of superficial contusion. Clinical correlation advised. 6.  Aortic Atherosclerosis (ICD10-I70.0). Electronically Signed   By: Signa Kell M.D.   On: 08/31/2023 07:07   CT HEAD WO CONTRAST ( ) Result Date: 08/31/2023 CLINICAL DATA:  Blunt poly trauma.  Head trauma on Eliquis. EXAM: CT HEAD WITHOUT CONTRAST CT CERVICAL SPINE WITHOUT CONTRAST TECHNIQUE: Multidetector CT imaging of the head and cervical spine was performed following the standard protocol without intravenous contrast. Multiplanar CT image reconstructions of the cervical spine were also generated. RADIATION DOSE REDUCTION: This exam was performed according to the departmental dose-optimization program which includes automated exposure control, adjustment of the mA and/or kV according to patient size and/or use of iterative reconstruction technique. COMPARISON:  12/15/2020 FINDINGS: CT HEAD FINDINGS Brain: No evidence of acute infarction, hemorrhage, hydrocephalus, extra-axial collection or mass lesion/mass effect. 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 Encephalomalacia in the right frontal lobe beneath a burr hole for presumed ventricular drain. No new finding Vascular: Right-sided aneurysm clip as noted above. Atheromatous calcification. Skull: Unremarkable right pterional craniotomy Sinuses/Orbits: No evidence of injury CT CERVICAL SPINE FINDINGS Alignment:  Normal. Skull base and vertebrae: No acute fracture. No primary bone lesion or focal pathologic process. Soft tissues and spinal canal: No prevertebral fluid or swelling. No visible canal hematoma. Disc levels:  No significant degeneration. Upper chest: Reported separately IMPRESSION: No evidence of acute intracranial or cervical spine injury. Electronically Signed   By: Tiburcio Pea M.D.   On: 08/31/2023 06:55   CT Cervical Spine Wo Contrast Result Date: 08/31/2023 CLINICAL DATA:  Blunt poly trauma.  Head trauma on Eliquis. EXAM: CT HEAD WITHOUT CONTRAST CT CERVICAL SPINE WITHOUT CONTRAST TECHNIQUE: Multidetector CT imaging of the head and cervical spine was performed following the standard protocol without intravenous contrast. Multiplanar CT image reconstructions of the cervical spine were also generated. RADIATION DOSE REDUCTION: This exam was performed according to the departmental dose-optimization program which includes automated exposure control, adjustment of the mA and/or kV according to patient size and/or use of iterative reconstruction technique. COMPARISON:  12/15/2020 FINDINGS: CT HEAD FINDINGS Brain: No evidence of acute infarction, hemorrhage, hydrocephalus, extra-axial collection or mass lesion/mass effect. 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 Encephalomalacia in the right frontal lobe beneath a burr hole for presumed ventricular drain. No new finding Vascular: Right-sided aneurysm clip as noted above. Atheromatous calcification. Skull: Unremarkable right pterional craniotomy Sinuses/Orbits: No evidence of injury CT CERVICAL SPINE FINDINGS Alignment: Normal. Skull base and vertebrae: No acute fracture. No primary bone lesion or focal pathologic process. Soft tissues and spinal canal: No prevertebral fluid or swelling. No visible canal hematoma. Disc levels:  No significant degeneration. Upper chest: Reported separately IMPRESSION: No evidence of acute  intracranial or cervical spine injury. Electronically Signed   By: Tiburcio Pea M.D.   On: 08/31/2023 06:55      LOS: 0 days    Jacquelin Hawking,  MD [facility] 09/01/2023, 1:50 PM   If 7PM-7AM, please contact night-coverage [email or web address]

## 2023-09-01 NOTE — Evaluation (Signed)
Physical Therapy Evaluation Patient Details Name: LOYS Butler MRN: [id number] DOB: [birth year] Today's Date: 09/01/2023  History of Present Illness  The pt is a 57 yo male presenting 12/22 after episode of weakness and being lowered to ground at home. Work up revealed anemia with Hgb to 5.6, CT head neg for acute injury. Attempted EGD 12/23 but canceled due to afib with RVR. PMH includes: CHF, alcohol abuse, anxiety, A-fib on Eliquis, HTN, ruptured brain aneurysm s/p craniotomy and coiling in 2016, gout, tobacco abuse.   Clinical Impression  Pt in bed upon arrival of PT, agreeable to evaluation at this time. Prior to admission the pt was ambulating with use of rollator in the home, reports not often needing physical assistance for transfers or gait, but wife usually present to supervise mobility and wife completing all ADLs and IADLs. The pt was extremely fatigued today, slow to respond but giving accurate answers according to his wife. The pt required minA to complete bed mobility, then reports significant dizziness with sitting and was unable to tolerate more than 8 min static sitting EOB. He required minA to scoot laterally along EOB with minimal clearance of hips, declined further gait/transfers due to dizziness and fatigue. Given pt's poor activity tolerance, need for assist with all mobility, and having 3 stairs to enter his home, I would typically recommend continued inpatient rehab <3hours/day to regain strength and endurance to allow the pt greater independence and safety with mobility in the home. However, pt and family expressed preference for therapy at home, will need continued acute PT to progress strength, endurance, and stability as pt is at great risk for continued falls at this time.      If plan is discharge home, recommend the following: Two people to help with walking and/or transfers;A lot of help with bathing/dressing/bathroom;Assistance with cooking/housework;Direct  supervision/assist for medications management;Assistance with feeding;Direct supervision/assist for financial management;Assist for transportation;Help with stairs or ramp for entrance   Can travel by private vehicle   No    Equipment Recommendations  (tub bench, possible WC depending on ambulation endurance)  Recommendations for Other Services  OT consult    Functional Status Assessment Patient has had a recent decline in their functional status and demonstrates the ability to make significant improvements in function in a reasonable and predictable amount of time.     Precautions / Restrictions Precautions Precautions: Fall Precaution Comments: orthostatics Restrictions Weight Bearing Restrictions Per Provider Order: No      Mobility  Bed Mobility Overal bed mobility: Needs Assistance Bed Mobility: Rolling, Sidelying to Sit, Sit to Supine Rolling: Min assist Sidelying to sit: Min assist   Sit to supine: Contact guard assist   General bed mobility comments: minA to elevate trunk and steady, able to scoot to EOB and along EOB with minA. reports significant dizziness, limited tolerance to ~8 min    Transfers                   General transfer comment: pt declined standing, able to scoot along EOB with minimal hip clearance with miNA        Balance Overall balance assessment: Needs assistance Sitting-balance support: Bilateral upper extremity supported, Feet supported Sitting balance-Leahy Scale: Fair Sitting balance - Comments: limited endurance, slight sway with movement of LE or UE Postural control: Posterior lean  Pertinent Vitals/Pain Pain Assessment Pain Assessment: No/denies pain    Home Living Family/patient expects to be discharged to:: Private residence Living Arrangements: Spouse/significant other Available Help at Discharge: Family;Available PRN/intermittently (wife works 9-12, pt usually still  in bed until she returns home) Type of Home: House Home Access: Stairs to enter Entrance Stairs-Rails: Right;Left;Can reach both Secretary/administrator of Steps: 3   Home Layout: Multi-level;Laundry or work area in basement;Able to live on main level with bedroom/bathroom Home Equipment: Rollator (4 wheels);Shower seat      Prior Function Prior Level of Function : Needs assist       Physical Assist : Mobility (physical);ADLs (physical) Mobility (physical): Gait;Stairs ADLs (physical): Bathing;Dressing;Toileting;IADLs Mobility Comments: uses rollator in the home, can stand without wifes assist, but usually she is there for supervision. assists him to sit on the floor when he is weak. reports controlled lowering, not falls. not walking more than household distances, largely sitting in couch during day ADLs Comments: wife bathes in bed, does dressing, pt is able to feed himself, wife does all IADLs     Extremity/Trunk Assessment   Upper Extremity Assessment Upper Extremity Assessment: Right hand dominant;LUE deficits/detail LUE Deficits / Details: weak grip compared to R, pt reports intermittent L hand numbness. decreased strength for wrist flexion, extension, and finger abduction. reports sensation intact today LUE Sensation: WNL LUE Coordination: decreased fine motor    Lower Extremity Assessment Lower Extremity Assessment: Generalized weakness (grossly 4-/5 to MMT, poor endurance. decreased sensation on bottoms of feet)    Cervical / Trunk Assessment Cervical / Trunk Assessment: Other exceptions Cervical / Trunk Exceptions: large body habitus  Communication   Communication Communication: No apparent difficulties Cueing Techniques: Verbal cues;Gestural cues;Visual cues  Cognition Arousal: Alert Behavior During Therapy: Flat affect Overall Cognitive Status: Within Functional Limits for tasks assessed                                 General Comments: pt with  slowed responses, but answers correct per wife        General Comments General comments (skin integrity, edema, etc.): pt c/o dizziness with transition to sit, did not have dynamap this session and pt too fatigued to maitain sitting for testing        Assessment/Plan    PT Assessment Patient needs continued PT services  PT Problem List Decreased range of motion;Decreased strength;Decreased activity tolerance;Decreased balance;Decreased mobility;Decreased coordination       PT Treatment Interventions DME instruction;Gait training;Stair training;Functional mobility training;Therapeutic activities;Therapeutic exercise;Balance training;Neuromuscular re-education;Patient/family education    PT Goals (Current goals can be found in the Care Plan section)  Acute Rehab PT Goals Patient Stated Goal: return home PT Goal Formulation: With patient/family Time For Goal Achievement: 09/15/23 Potential to Achieve Goals: Fair    Frequency Min 1X/week        AM-PAC PT "6 Clicks" Mobility  Outcome Measure Help needed turning from your back to your side while in a flat bed without using bedrails?: A Little Help needed moving from lying on your back to sitting on the side of a flat bed without using bedrails?: A Little Help needed moving to and from a bed to a chair (including a wheelchair)?: A Lot Help needed standing up from a chair using your arms (e.g., wheelchair or bedside chair)?: A Lot Help needed to walk in hospital room?: Total Help needed climbing 3-5 steps with a railing? : Total 6  Click Score: 12    End of Session   Activity Tolerance: Patient limited by fatigue;Other (comment) (dizziness) Patient left: in bed;with call bell/phone within reach;with bed alarm set;with family/visitor present Nurse Communication: Mobility status PT Visit Diagnosis: Other abnormalities of gait and mobility (R26.89);Repeated falls (R29.6);Muscle weakness (generalized) (M62.81)    Time:  8657-8469 PT Time Calculation (min) (ACUTE ONLY): 28 min   Charges:   PT Evaluation $PT Eval Moderate Complexity: 1 Mod PT Treatments $Therapeutic Activity: 8-22 mins PT General Charges $$ ACUTE PT VISIT: 1 Visit         Vickki Muff, PT, DPT   Acute Rehabilitation Department Office [phone] Secure Chat Communication Preferred  Ronnie Derby 09/01/2023, 3:39 PM

## 2023-09-01 NOTE — Hospital Course (Addendum)
Daniel Butler is a 57 y.o. 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  Patient presented secondary to after sudden loss of strength in his legs.  Patient found to have evidence of acute alcohol intoxication but also found to have profound anemia of unknown etiology complicated by alcohol use, history of melena, Eliquis use.  Gastroenterology consulted for endoscopic evaluation.  Patient transfused 1 unit of PRBC on admission.

## 2023-09-01 NOTE — Progress Notes (Signed)
RBC is being transfused. RN is at bed side. No reaction noted. Increased the rate after 15 min. Will continue to monitor

## 2023-09-01 NOTE — Interval H&P Note (Addendum)
History and Physical Interval Note:  09/01/2023 7:48 AM  Daniel Butler  has presented today for surgery, with the diagnosis of anemia, dysphagia, melena.  The various methods of treatment have been discussed with the patient and family. After consideration of risks, benefits and other options for treatment, the patient has consented to  Procedure(s): ESOPHAGOGASTRODUODENOSCOPY (EGD) WITH PROPOFOL (N/A) as a surgical intervention.  The patient's history has been reviewed, patient examined, no change in status, stable for surgery.  I have reviewed the patient's chart and labs.  Questions were answered to the patient's satisfaction.    Patient currently in A-fib with ventricular rates in the 100s-130s.  Blood pressure with systolics 100s.  Asymptomatic.  Patient not actively bleeding.  Will discuss with anesthesia about safety of proceeding with procedure   Jenel Lucks    Will plan to repeat EGD tomorrow.  Patient thinks that he would be able to do a bowel prep this evening.  Given the absence of any overt bleeding, I do think an EGD and colonoscopy are warranted to evaluate his anemia.  Hopefully, his tachycardia was just secondary to with holding beta-blocker, and this will improve once his metoprolol is restarted.  Plan for EGD and colonoscopy tomorrow.  Clear liquid diet today, with bowel prep tonight. Continue to hold Eliquis.

## 2023-09-01 NOTE — Progress Notes (Signed)
Pt in afib with RVR (HR 100s-140s) in pre-procedure for EGD. Procedure canceled and patient transferred back to the floor.    Roselie Awkward, RN

## 2023-09-01 NOTE — Progress Notes (Addendum)
Transition of Care [facility]) - Inpatient Brief Assessment   Patient Details  Name: Daniel Butler MRN: [id number] Date of Birth: 13-Aug-1966  Transition of [facility]) CM/SW Contact:    Janae Bridgeman, RN Phone Number: 09/01/2023, 4:24 PM   Clinical Narrative: CM met with the patient at the bedside and he states that he was "tired and just wants to sleep".  Patient requested that I call and speak with the patient's wife by phone.  The patient states that patient has a Museum/gallery exhibitions officer, Cane and shower chair in the home.  I advised that she purchase a wheelchair or tub bench from [place] and she states that she did not believe the patient will need a wheelchair.  Wife was updated that she could order a wheelchair and/or tub bench from [employer] in [place], [place] as well.  Patient's wife states that patient has cut-down on his drinking ETOH but usually has 1-2 beers per day and occasionally a shot.  Resources provided in the AVS.  Patient's wife was agreeable to referral for OUtpatient therapy at [facility].  Referral was placed and MD asked to co-sign.  Patient states that he has a Clinical research associate that is assisting with his disability process to obtain disability and healthcare coverage pending disability approval.  Resources for social services placed in the AVS.  No other TOC needs at this time.  Wife and patient plan to have patient return home when medically stable and follow up for OUtpatient therapies.   Transition of Care Asessment: Insurance and Status: (P) Insurance coverage has been reviewed Patient has primary care physician: (P) Yes Home environment has been reviewed: (P) from home with wife Prior level of function:: (P) wife assists at the home Prior/Current Home Services: (P) No current home services Social Drivers of Health Review: (P) SDOH reviewed interventions complete Readmission risk has been reviewed: (P) Yes Transition of care  needs: (P) transition of care needs identified, TOC will continue to follow

## 2023-09-01 NOTE — Progress Notes (Signed)
Patient was taken for the endoscopy at 0715 AM via bed

## 2023-09-01 NOTE — Anesthesia Preprocedure Evaluation (Signed)
Anesthesia Evaluation    Airway        Dental   Pulmonary Current Smoker and Patient abstained from smoking.          Cardiovascular hypertension,      Neuro/Psych    GI/Hepatic   Endo/Other    Renal/GU      Musculoskeletal   Abdominal   Peds  Hematology   Anesthesia Other Findings   Reproductive/Obstetrics                             Anesthesia Physical Anesthesia Plan  ASA:   Anesthesia Plan:    Post-op Pain Management:    Induction:   PONV Risk Score and Plan:   Airway Management Planned:   Additional Equipment:   Intra-op Plan:   Post-operative Plan:   Informed Consent:   Plan Discussed with:   Anesthesia Plan Comments: (Not actively bleeding per GI. Will delay case for better cardiac optimization (rate control). Current HR Afib w/ RVR (130's - 140's) and inconsistent blood pressures. )       Anesthesia Quick Evaluation

## 2023-09-01 NOTE — Progress Notes (Signed)
01 unit of RBC transfusion started at 26ml/hour, 1822 PM. RN at bed side. No reaction noted. Patient is alert and oriented.  Will continue to monitor.

## 2023-09-01 NOTE — Evaluation (Addendum)
Clinical/Bedside Swallow Evaluation Patient Details  Name: Daniel Butler MRN: [id number] Date of Birth: 25-Dec-1965  Today's Date: 09/01/2023 Time: SLP Start Time (ACUTE ONLY): 1306 SLP Stop Time (ACUTE ONLY): 1322 SLP Time Calculation (min) (ACUTE ONLY): 16 min  Past Medical History:  Past Medical History:  Diagnosis Date   Brain aneurysm    Chicken pox    Depression    Elevated LFTs    Gout    Hernia of abdominal cavity 11/2020   High cholesterol    Hypertension    Past Surgical History:  Past Surgical History:  Procedure Laterality Date   CARDIOVERSION N/A 01/22/2021   Procedure: CARDIOVERSION;  Surgeon: Meriam Sprague, MD;  Location: [facility] ENDOSCOPY;  Service: Cardiovascular;  Laterality: N/A;   CRANIOTOMY Right 03/02/2015   Procedure: CRANIOTOMY INTRACRANIAL  ANEURYSM FOR CLIPPING;  Surgeon: Lisbeth Renshaw, MD;  Location: [facility] NEURO ORS;  Service: Neurosurgery;  Laterality: Right;   IR GENERIC HISTORICAL  02/16/2016   IR ANGIO VERTEBRAL SEL VERTEBRAL BILAT MOD SED 02/16/2016 Lisbeth Renshaw, MD [facility]-INTERV RAD   IR GENERIC HISTORICAL  02/16/2016   IR ANGIO INTRA EXTRACRAN SEL INTERNAL CAROTID BILAT MOD SED 02/16/2016 Lisbeth Renshaw, MD [facility]-INTER[facility] RAD   RADIOLOGY WITH ANESTHESIA N/A 03/02/2015   Procedure: RADIOLOGY WITH ANESTHESIA;  Surgeon: Lisbeth Renshaw, MD;  Location: [facility] OR;  Service: Radiology;  Laterality: N/A;   RIGHT/LEFT HEART CATH AND CORONARY ANGIOGRAPHY N/A 12/19/2020   Procedure: RIGHT/LEFT HEART CATH AND CORONARY ANGIOGRAPHY;  Surgeon: [place], Peter M, MD;  Location: [facility] INVASIVE CV LAB;  Service: Cardiovascular;  Laterality: N/A;   WISDOM TOOTH EXTRACTION     HPI:  Pt is a 57 yo male presenting after fall. Pt has been experiencing anemia and intermittent melena but had deferred recommended hospitalization and colonoscopy. He has been having trouble swallowing large pills and food (goes down slowly, sometimes comes back up), but his wife has also noticed  coughing with thin liquids if he drinks them too quickly. EGD was attempted 12/23 but deferred as pt was found to be in afib with RVR. PMH includes: GERD, HTN, HLD, afib, HFrEF, ruptured brain aneurysm s/p craniotomy and coiling (2016), gout, tobacco abuse, alcohol abuse. Most recent clinical swallow eval in April 2022 raised suspicion for LPR but oropharyngeal swallow appeared to be functional.    Assessment / Plan / Recommendation  Clinical Impression  Pt says he primarily has trouble with solids when they are too large and/or too dry. He feels like they get stuck, but that they go down if he uses a liquid wash. Clinical observation was performed today with liquids only as he is only on a clear liquid diet. He coughed on the initial sip, saying that this happens if he drinks too quickly. He was encouraged to slow down, and there were no further signs concerning for aspiration or dysphagia with water. Will leave on current, CLD for now, with use of precautions like slower rate. Pt denies any h/o PNA. We did discuss MBS as an option while he is inpatient, especially since OP f/u overall has been a challenge for him, but would like to f/u with him after he has been cleared for more solid POs first.  SLP Visit Diagnosis: Dysphagia, unspecified (R13.10)    Aspiration Risk  Mild aspiration risk    Diet Recommendation Thin liquid (on clear liquid diet per MD - will advance per MD discretion)    Liquid Administration via: Cup;Straw Medication Administration: Whole meds with liquid Supervision: Patient  able to self feed;Intermittent supervision to cue for compensatory strategies Compensations: Slow rate Postural Changes: Seated upright at 90 degrees;Remain upright for at least 30 minutes after po intake    Other  Recommendations Oral Care Recommendations: Oral care BID    Recommendations for follow up therapy are one component of a multi-disciplinary discharge planning process, led by the attending  physician.  Recommendations may be updated based on patient status, additional functional criteria and insurance authorization.  Follow up Recommendations  (tba - none anticipated)      Assistance Recommended at Discharge    Functional Status Assessment Patient has had a recent decline in their functional status and demonstrates the ability to make significant improvements in function in a reasonable and predictable amount of time.  Frequency and Duration min 2x/week  2 weeks       Prognosis Prognosis for improved oropharyngeal function: Good      Swallow Study   General HPI: Pt is a 57 yo male presenting after fall. Pt has been experiencing anemia and intermittent melena but had deferred recommended hospitalization and colonoscopy. He has been having trouble swallowing large pills and food (goes down slowly, sometimes comes back up), but his wife has also noticed coughing with thin liquids if he drinks them too quickly. EGD was attempted 12/23 but deferred as pt was found to be in afib with RVR. PMH includes: GERD, HTN, HLD, afib, HFrEF, ruptured brain aneurysm s/p craniotomy and coiling (2016), gout, tobacco abuse, alcohol abuse. Most recent clinical swallow eval in April 2022 raised suspicion for LPR but oropharyngeal swallow appeared to be functional. Type of Study: Bedside Swallow Evaluation Previous Swallow Assessment: see HPI Diet Prior to this Study: Clear liquid diet;Thin liquids (Level 0) Temperature Spikes Noted: No Respiratory Status: Room air History of Recent Intubation: No Behavior/Cognition: Alert;Cooperative;Pleasant mood Oral Cavity Assessment: Within Functional Limits Oral Care Completed by SLP: No Oral Cavity - Dentition: Poor condition;Missing dentition Vision: Functional for self-feeding Self-Feeding Abilities: Able to feed self Patient Positioning: Upright in bed Baseline Vocal Quality: Normal Volitional Cough: Strong Volitional Swallow: Able to elicit     Oral/Motor/Sensory Function Overall Oral Motor/Sensory Function: Within functional limits   Ice Chips Ice chips: Within functional limits Presentation: Spoon   Thin Liquid Thin Liquid: Impaired Presentation: Self Fed;Straw Pharyngeal  Phase Impairments: Cough - Immediate    Nectar Thick Nectar Thick Liquid: Not tested   Honey Thick Honey Thick Liquid: Not tested   Puree Puree: Not tested   Solid     Solid: Not tested      Mahala Menghini., M.A. CCC-SLP Acute Rehabilitation Services Office [phone]  Secure chat preferred  09/01/2023,2:10 PM

## 2023-09-02 ENCOUNTER — Inpatient Hospital Stay (HOSPITAL_COMMUNITY): Payer: Medicaid Other | Admitting: Certified Registered Nurse Anesthetist

## 2023-09-02 ENCOUNTER — Encounter (HOSPITAL_COMMUNITY)
Admission: EM | Disposition: A | Discharge status: Home / Self Care | Payer: Self-pay | Source: Home / Self Care | Attending: Family Medicine

## 2023-09-02 DIAGNOSIS — D124 Benign neoplasm of descending colon: Secondary | ICD-10-CM

## 2023-09-02 DIAGNOSIS — K3189 Other diseases of stomach and duodenum: Secondary | ICD-10-CM

## 2023-09-02 DIAGNOSIS — K317 Polyp of stomach and duodenum: Secondary | ICD-10-CM

## 2023-09-02 DIAGNOSIS — K2289 Other specified disease of esophagus: Secondary | ICD-10-CM

## 2023-09-02 DIAGNOSIS — K5730 Diverticulosis of large intestine without perforation or abscess without bleeding: Secondary | ICD-10-CM

## 2023-09-02 DIAGNOSIS — D122 Benign neoplasm of ascending colon: Secondary | ICD-10-CM

## 2023-09-02 DIAGNOSIS — D123 Benign neoplasm of transverse colon: Secondary | ICD-10-CM

## 2023-09-02 DIAGNOSIS — D126 Benign neoplasm of colon, unspecified: Secondary | ICD-10-CM

## 2023-09-02 DIAGNOSIS — D509 Iron deficiency anemia, unspecified: Principal | ICD-10-CM

## 2023-09-02 DIAGNOSIS — D132 Benign neoplasm of duodenum: Secondary | ICD-10-CM

## 2023-09-02 HISTORY — PX: POLYPECTOMY: SHX5525

## 2023-09-02 HISTORY — PX: ESOPHAGOGASTRODUODENOSCOPY: SHX5428

## 2023-09-02 HISTORY — PX: BIOPSY: SHX5522

## 2023-09-02 HISTORY — PX: COLONOSCOPY: SHX5424

## 2023-09-02 HISTORY — PX: HEMOSTASIS CLIP PLACEMENT: SHX6857

## 2023-09-02 LAB — BPAM RBC
Blood Product Expiration Date: 202412252359
Blood Product Expiration Date: 202501072359
Blood Product Expiration Date: 202501272359
Blood Product Expiration Date: 202501292359
ISSUE DATE / TIME: 202412220632
ISSUE DATE / TIME: 202412221057
ISSUE DATE / TIME: 202412231818
ISSUE DATE / TIME: 202412232125
Unit Type and Rh: 600
Unit Type and Rh: 600
Unit Type and Rh: 600
Unit Type and Rh: 9500

## 2023-09-02 LAB — TYPE AND SCREEN
ABO/RH(D): A NEG
Antibody Screen: NEGATIVE
Unit division: 0
Unit division: 0
Unit division: 0
Unit division: 0

## 2023-09-02 LAB — BASIC METABOLIC PANEL
Anion gap: 10 (ref 5–15)
BUN: 10 mg/dL (ref 6–20)
CO2: 19 mmol/L — ABNORMAL LOW (ref 22–32)
Calcium: 8.1 mg/dL — ABNORMAL LOW (ref 8.9–10.3)
Chloride: 114 mmol/L — ABNORMAL HIGH (ref 98–111)
Creatinine, Ser: 1.51 mg/dL — ABNORMAL HIGH (ref 0.61–1.24)
GFR, Estimated: 53 mL/min — ABNORMAL LOW (ref >60)
Glucose, Bld: 96 mg/dL (ref 70–99)
Potassium: 3.8 mmol/L (ref 3.5–5.1)
Sodium: 143 mmol/L (ref 135–145)

## 2023-09-02 LAB — HEMOGLOBIN AND HEMATOCRIT, BLOOD
HCT: 28.9 % — ABNORMAL LOW (ref 39.0–52.0)
Hemoglobin: 8.5 g/dL — ABNORMAL LOW (ref 13.0–17.0)

## 2023-09-02 LAB — CBC
HCT: 30.9 % — ABNORMAL LOW (ref 39.0–52.0)
Hemoglobin: 9.2 g/dL — ABNORMAL LOW (ref 13.0–17.0)
MCH: 24.4 pg — ABNORMAL LOW (ref 26.0–34.0)
MCHC: 29.8 g/dL — ABNORMAL LOW (ref 30.0–36.0)
MCV: 82 fL (ref 80.0–100.0)
Platelets: 173 10*3/uL (ref 150–400)
RBC: 3.77 MIL/uL — ABNORMAL LOW (ref 4.22–5.81)
RDW: 18.1 % — ABNORMAL HIGH (ref 11.5–15.5)
WBC: 9.1 10*3/uL (ref 4.0–10.5)
nRBC: 0 % (ref 0.0–0.2)

## 2023-09-02 LAB — UREA NITROGEN, URINE: Urea Nitrogen, Ur: 382 mg/dL

## 2023-09-02 SURGERY — EGD (ESOPHAGOGASTRODUODENOSCOPY)
Anesthesia: Monitor Anesthesia Care

## 2023-09-02 MED ORDER — PHENYLEPHRINE 80 MCG/ML (10ML) SYRINGE FOR IV PUSH (FOR BLOOD PRESSURE SUPPORT)
PREFILLED_SYRINGE | INTRAVENOUS | Status: DC | PRN
  Administered 2023-09-02: 80 ug via INTRAVENOUS
  Administered 2023-09-02 (×2): 160 ug via INTRAVENOUS
  Administered 2023-09-02: 80 ug via INTRAVENOUS

## 2023-09-02 MED ORDER — ALBUMIN HUMAN 5 % IV SOLN: INTRAVENOUS | Status: DC | PRN

## 2023-09-02 MED ORDER — PROPOFOL 500 MG/50ML IV EMUL
INTRAVENOUS | Status: DC | PRN
  Administered 2023-09-02: 125 ug/kg/min via INTRAVENOUS

## 2023-09-02 MED ORDER — DILTIAZEM HCL-DEXTROSE 125-5 MG/125ML-% IV SOLN (PREMIX)
5.0000 mg/h | INTRAVENOUS | Status: DC
  Administered 2023-09-02: 10 mg/h via INTRAVENOUS
  Administered 2023-09-02 – 2023-09-03 (×2): 5 mg/h via INTRAVENOUS
  Filled 2023-09-02 (×3): qty 125

## 2023-09-02 MED ORDER — PROPOFOL 10 MG/ML IV BOLUS
INTRAVENOUS | Status: DC | PRN
  Administered 2023-09-02: 60 mg via INTRAVENOUS

## 2023-09-02 MED ORDER — PHENYLEPHRINE HCL-NACL 20-0.9 MG/250ML-% IV SOLN
INTRAVENOUS | Status: DC | PRN
  Administered 2023-09-02: 30 ug/min via INTRAVENOUS

## 2023-09-02 MED ORDER — LIDOCAINE HCL (CARDIAC) PF 100 MG/5ML IV SOSY
PREFILLED_SYRINGE | INTRAVENOUS | Status: DC | PRN
Start: 2023-09-02 — End: 2023-09-02
  Administered 2023-09-02: 100 mg via INTRATRACHEAL

## 2023-09-02 NOTE — Progress Notes (Signed)
12:00PM: CSW spoke with pts spouse, [name], about PT recs for SNF. [name] states that pt does not need SNF at this time and would like outpt PT instead.   TOC will continue to follow.   Johnnette Gourd, MSW, LCSWA Transitions of Care [phone]

## 2023-09-02 NOTE — Progress Notes (Signed)
PROGRESS NOTE    Daniel Butler  JOA:[id number] DOB: 07/17/1966 DOA: 08/31/2023 PCP: Olive Bass, FNP   Brief Narrative: Daniel Butler is a 57 y.o. 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  Patient presented secondary to after sudden loss of strength in his legs.  Patient found to have evidence of acute alcohol intoxication but also found to have profound anemia of unknown etiology complicated by alcohol use, history of melena, Eliquis use.  Gastroenterology consulted for endoscopic evaluation.  Patient transfused 1 unit of PRBC on admission. Upper and lower endoscopic evaluation did not reveal obvious source for bleeding. Hospitalization complicated by development of atrial fibrillation with RVR requiring diltiazem IV infusion.   Assessment and Plan:  Fall Patient experienced a fall at home in setting of acute alcohol intoxication complicated by associated anemia.  Patient underwent CT of the head, cervical spine, chest, abdomen/pelvis without evidence of acute fracture or hematoma.  PT ordered for evaluation.  Symptomatic anemia Acute anemia Iron deficiency anemia Unclear etiology but patient is high risk for GI bleeding secondary to melena history, alcohol use complicated by Eliquis use.  Imaging negative for hematoma in setting of fall.  Hemoglobin of 5.6 g/dL on admission requiring transfusion of 2 units of PRBC.  Posttransfusion hemoglobin of 8.0 g/dL.  Hemoglobin trended down to 6.8 g/dL necessitating a repeat transfusion of 2 units of PRBC on 12/23 with post-transfusion hemoglobin of 9.2. Upper and lower endoscopies without obvious etiology for iron deficiency anemia. -Repeat H&H today and tomorrow  Possible Barrett's esophagus Noted on EGD. Biopsied.  Duodenal polyp Noted on EGD. Biopsied. GI  recommendation to await pathology and possibly refer for ampullectomy  Colonic polyps Eight polyps noted on colonoscopy which were removed and retrieved.   Moderate diverticulosis Noted in sigmoid colon and descending colon. Noted on colonoscopy.  Persistent atrial fibrillation with RVR Patient is managed on metoprolol as an outpatient and is also on Eliquis for stroke prevention.  Patient developed RVR while in the hospital likely secondary to missed metoprolol dosing.  Endoscopy procedure postponed secondary to RVR. Patient required initiation of diltiazem drip for management -Continue metoprolol succinate 50 mg daily -Wean off diltiazem drip if able versus transition to diltiazem PO if unable to wean off -Continue telemetry  Dysphagia Patient with concerns for food and pills getting stuck. Speech therapy consulted and GI consulted. Plan for upper endoscopy by GI to evaluate for etiology.  AKI Baseline creatinine appears to be around 1.2-1.4 from metabolic panel one year prior. Creatinine of 1.8 on admission with improvement to 1.51. Possibly related to hypervolemia  Acute on chronic HFrEF LVEF of 45-50%. Present on admission with evidence of lower extremity edema. Patient given Lasix IV on admission. BNP elevated at 200. Currently on room air.  Hypokalemia Mild. Resolved with potassium supplementation.  Alcohol abuse with acute intoxication Alcohol level of 287 on admission. Patient reports frequent alcohol intake and has been cutting down. Denies history of alcohol withdrawal but notes history of "shakes." He has been managed on Ativan while admitted in the past, in addition to Librium. 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 Noted.  Tobacco use Counseled on admission.  Umbilical hernia Reducible. No symptoms.   DVT prophylaxis: SCDs Code Status:   Code Status: Full Code Family Communication:  None at bedside. Wife on telephone  Disposition Plan: Discharge home likely in 1-2 days pending improvement of atrial fibrillation with RVR   Consultants:   Gastroenterology  Procedures:  None  Antimicrobials: None    Subjective: No issues this afternoon. Feels well. No bowel movement.  Objective: BP 118/85   Pulse 87   Temp 98.1 F (36.7 C) (Oral)   Resp 20   Ht 6\' 1"  (1.854 m)   Wt 115.4 kg   SpO2 98%   BMI 33.57 kg/m   Examination:  General exam: Appears calm and comfortable Respiratory system: Clear to auscultation. Respiratory effort normal. Cardiovascular system: S1 & S2 heard, irregular rhythm, normal rate Gastrointestinal system: Abdomen is nondistended, soft and nontender. No organomegaly or masses felt. Normal bowel sounds heard. Central nervous system: Alert and oriented. No focal neurological deficits. Musculoskeletal: No edema. No calf tenderness Psychiatry: Judgement and insight appear normal. Mood & affect appropriate.    Data Reviewed: I have personally reviewed following labs and imaging studies  CBC Lab Results  Component Value Date   WBC 9.1 09/02/2023   RBC 3.77 (L) 09/02/2023   HGB 9.2 (L) 09/02/2023   HCT 30.9 (L) 09/02/2023   MCV 82.0 09/02/2023   MCH 24.4 (L) 09/02/2023   PLT 173 09/02/2023   MCHC 29.8 (L) 09/02/2023   RDW 18.1 (H) 09/02/2023   LYMPHSABS 2.2 08/31/2023   MONOABS 0.5 08/31/2023   EOSABS 0.2 08/31/2023   BASOSABS 0.0 08/31/2023     Last metabolic panel Lab Results  Component Value Date   NA 143 09/02/2023   K 3.8 09/02/2023   CL 114 (H) 09/02/2023   CO2 19 (L) 09/02/2023   BUN 10 09/02/2023   CREATININE 1.51 (H) 09/02/2023   GLUCOSE 96 09/02/2023   GFRNONAA 53 (L) 09/02/2023   GFRAA >60 02/16/2016   CALCIUM 8.1 (L) 09/02/2023   PHOS 3.5 08/31/2023   PROT 4.6 (L) 09/01/2023   ALBUMIN 2.0 (L) 09/01/2023   LABGLOB 2.5 04/22/2022   AGRATIO 1.5 04/22/2022   BILITOT 1.0 09/01/2023   ALKPHOS 58  09/01/2023   AST 18 09/01/2023   ALT 12 09/01/2023   ANIONGAP 10 09/02/2023    GFR: Estimated Creatinine Clearance: 71 mL/min (A) (by C-G formula based on SCr of 1.51 mg/dL (H)).  No results found for this or any previous visit (from the past 240 hours).    Radiology Studies: No results found.     LOS: 1 day    Jacquelin Hawking, MD [facility] 09/02/2023, 8:33 AM   If 7PM-7AM, please contact night-coverage [email or web address]

## 2023-09-02 NOTE — Transfer of Care (Signed)
Immediate Anesthesia Transfer of Care Note  Patient: Daniel Butler  Procedure(s) Performed: ESOPHAGOGASTRODUODENOSCOPY (EGD) COLONOSCOPY BIOPSY POLYPECTOMY HEMOSTASIS CLIP PLACEMENT  Patient Location: PACU and Endoscopy Unit  Anesthesia Type:MAC  Level of Consciousness: awake, alert , oriented, and patient cooperative  Airway & Oxygen Therapy: Patient Spontanous Breathing  Post-op Assessment: Report given to RN and Post -op Vital signs reviewed and stable  Post vital signs: Reviewed and stable  Last Vitals:  Vitals Value Taken Time  BP 95/66 09/02/23 1007  Temp    Pulse 64 09/02/23 1007  Resp 20 09/02/23 1008  SpO2 96 % 09/02/23 1007  Vitals shown include unfiled device data.  Last Pain:  Vitals:   09/02/23 1007  TempSrc:   PainSc: 0-No pain         Complications: No notable events documented.

## 2023-09-02 NOTE — Evaluation (Signed)
Occupational Therapy Evaluation Patient Details Name: Daniel Butler MRN: [id number] DOB: 1965-09-21 Today's Date: 09/02/2023   History of Present Illness The pt is a 57 yo male presenting 12/22 after episode of weakness and being lowered to ground at home. Work up revealed anemia with Hgb to 5.6, CT head neg for acute injury. Attempted EGD 12/23 but canceled due to afib with RVR. PMH includes: CHF, alcohol abuse, anxiety, A-fib on Eliquis, HTN, ruptured brain aneurysm s/p craniotomy and coiling in 2016, gout, tobacco abuse.   Clinical Impression   Patient admitted for the diagnosis above.  Left comfortable supine in bed with spouse present.  Spouse and patient endorses that he is at his baseline for ADL and simple transfers.  No OT needs in the acute setting and no post acute OT recommended.  Patient and spouse not interested in SNF level rehab, Patient willing to do outpatient PT if needed.         If plan is discharge home, recommend the following: Assist for transportation;Assistance with cooking/housework;A lot of help with bathing/dressing/bathroom;A little help with walking and/or transfers    Functional Status Assessment  Patient has not had a recent decline in their functional status  Equipment Recommendations  None recommended by OT    Recommendations for Other Services       Precautions / Restrictions Precautions Precautions: Fall Restrictions Weight Bearing Restrictions Per Provider Order: No      Mobility Bed Mobility Overal bed mobility: Needs Assistance Bed Mobility: Sit to Supine       Sit to supine: Contact guard assist, Min assist        Transfers Overall transfer level: Needs assistance Equipment used: Rolling walker (2 wheels) Transfers: Sit to/from Stand, Bed to chair/wheelchair/BSC Sit to Stand: Contact guard assist     Step pivot transfers: Contact guard assist            Balance Overall balance assessment: Needs  assistance Sitting-balance support: Feet supported, No upper extremity supported Sitting balance-Leahy Scale: Good     Standing balance support: Reliant on assistive device for balance Standing balance-Leahy Scale: Fair                             ADL either performed or assessed with clinical judgement   ADL Overall ADL's : At baseline                                       General ADL Comments: Spouse in reports patient is close to baseline.     Vision Patient Visual Report: No change from baseline       Perception Perception: Not tested       Praxis Praxis: Not tested       Pertinent Vitals/Pain Pain Assessment Pain Assessment: No/denies pain     Extremity/Trunk Assessment Upper Extremity Assessment LUE Deficits / Details: weak grip compared to R, pt reports intermittent L hand numbness. decreased strength for wrist flexion, extension, and finger abduction. reports sensation intact today LUE Coordination: decreased fine motor   Lower Extremity Assessment Lower Extremity Assessment: Defer to PT evaluation       Communication Communication Communication: No apparent difficulties   Cognition Arousal: Alert Behavior During Therapy: WFL for tasks assessed/performed Overall Cognitive Status: Within Functional Limits for tasks assessed  General Comments  VSS   Exercises     Shoulder Instructions      Home Living Family/patient expects to be discharged to:: Private residence Living Arrangements: Spouse/significant other Available Help at Discharge: Family;Available PRN/intermittently Type of Home: House Home Access: Stairs to enter [employer] of Steps: 3 Entrance Stairs-Rails: Right;Left;Can reach both Home Layout: Multi-level;Laundry or work area in basement;Able to live on main level with bedroom/bathroom     Bathroom Shower/Tub: Tub/shower unit;Sponge bathes  at baseline   [employer]: Standard Bathroom Accessibility: Yes How Accessible: Accessible via walker Home Equipment: Rollator (4 wheels);Shower seat          Prior Functioning/Environment Prior Level of Function : Needs assist       Physical Assist : Mobility (physical);ADLs (physical) Mobility (physical): Gait;Stairs ADLs (physical): Bathing;Dressing;Toileting;IADLs Mobility Comments: uses rollator in the home, can stand without wifes assist, but usually she is there for supervision. assists him to sit on the floor when he is weak. reports controlled lowering, not falls. not walking more than household distances, largely sitting in couch during day ADLs Comments: wife bathes in bed, does dressing, pt is able to feed himself, wife does all IADLs        OT Problem List: Decreased activity tolerance;Impaired balance (sitting and/or standing);Decreased strength      OT Treatment/Interventions:      OT Goals(Current goals can be found in the care plan section) Acute Rehab OT Goals Patient Stated Goal: Hoping to return home tomorrow OT Goal Formulation: With patient Time For Goal Achievement: 09/09/23 Potential to Achieve Goals: Good  OT Frequency:      Co-evaluation              AM-PAC OT "6 Clicks" Daily Activity     Outcome Measure Help from another person eating meals?: None Help from another person taking care of personal grooming?: None Help from another person toileting, which includes using toliet, bedpan, or urinal?: A Little Help from another person bathing (including washing, rinsing, drying)?: A Lot Help from another person to put on and taking off regular upper body clothing?: A Little Help from another person to put on and taking off regular lower body clothing?: A Lot 6 Click Score: 18   End of Session Equipment Utilized During Treatment: Rolling walker (2 wheels) Nurse Communication: Mobility status  Activity Tolerance: Patient tolerated  treatment well Patient left: in bed;with call bell/phone within reach;with family/visitor present  OT Visit Diagnosis: Muscle weakness (generalized) (M62.81)                Time: 1610-9604 OT Time Calculation (min): 22 min Charges:  OT General Charges $OT Visit: 1 Visit OT Evaluation $OT Eval Moderate Complexity: 1 Mod  09/02/2023  RP, OTR/L  Acute Rehabilitation Services  Office:  [phone]   Suzanna Obey 09/02/2023, 3:30 PM

## 2023-09-02 NOTE — Op Note (Signed)
[facility] Patient Name: Daniel Butler Procedure Date : 09/02/2023 MRN: [id number] Attending MD: Dub Amis. Tomasa Rand , MD, [id number] Date of Birth: [birth year] CSN: [id number] Age: 57 Admit Type: Inpatient Procedure:                Colonoscopy Indications:              Iron deficiency anemia Providers:                Lorin Picket E. Tomasa Rand, MD, Stephens Shire RN, RN, Alan Ripper, Technician Referring MD:              Medicines:                Monitored Anesthesia Care Complications:            No immediate complications. Estimated Blood Loss:     Estimated blood loss was minimal. Procedure:                Pre-Anesthesia Assessment:                           - Prior to the procedure, a History and Physical                            was performed, and patient medications and                            allergies were reviewed. The patient's tolerance of                            previous anesthesia was also reviewed. The risks                            and benefits of the procedure and the sedation                            options and risks were discussed with the patient.                            All questions were answered, and informed consent                            was obtained. Prior Anticoagulants: The patient has                            taken Eliquis (apixaban), last dose was 3 days                            prior to procedure. ASA Grade Assessment: III - A                            patient with severe systemic disease. After  reviewing the risks and benefits, the patient was                            deemed in satisfactory condition to undergo the                            procedure.                           - Prior to the procedure, a History and Physical                            was performed, and patient medications and                            allergies were reviewed. The patient's  tolerance of                            previous anesthesia was also reviewed. The risks                            and benefits of the procedure and the sedation                            options and risks were discussed with the patient.                            All questions were answered, and informed consent                            was obtained. Prior Anticoagulants: The patient has                            taken Eliquis (apixaban), last dose was 3 days                            prior to procedure. ASA Grade Assessment: III - A                            patient with severe systemic disease. After                            reviewing the risks and benefits, the patient was                            deemed in satisfactory condition to undergo the                            procedure.                           After obtaining informed consent, the colonoscope  was passed under direct vision. Throughout the                            procedure, the patient's blood pressure, pulse, and                            oxygen saturations were monitored continuously. The                            CF-HQ190L ([id number] Olympus coloscope was                            introduced through the anus and advanced to the the                            cecum, identified by appendiceal orifice and                            ileocecal valve. The colonoscopy was somewhat                            difficult due to significant looping. Successful                            completion of the procedure was aided by using                            manual pressure. The patient tolerated the                            procedure well. The quality of the bowel                            preparation was good. The ileocecal valve,                            appendiceal orifice, and rectum were photographed.                            The bowel preparation used was MoviPrep via split                             dose instruction. Scope In: 9:10:26 AM Scope Out: 9:54:24 AM Scope Withdrawal Time: 0 hours 36 minutes 59 seconds  Total Procedure Duration: 0 hours 43 minutes 58 seconds  Findings:      The perianal and digital rectal examinations were normal. Pertinent       negatives include normal sphincter tone and no palpable rectal lesions.      Three sessile polyps were found in the ascending colon. The polyps were       5 to 7 mm in size. These polyps were removed with a cold snare.       Resection and retrieval were complete. Estimated blood loss was minimal.      Two sessile polyps were found in the transverse colon.  The polyps were 5       to 6 mm in size. These polyps were removed with a cold snare. Resection       and retrieval were complete. Estimated blood loss was minimal.      A 13 mm polyp was found in the transverse colon. The polyp was       semi-pedunculated. The polyp was removed with a hot snare. Resection and       retrieval were complete. Estimated blood loss: none.      Two semi-pedunculated polyps were found in the distal transverse colon.       The polyps were 15 to 25 mm in size. These polyps were removed with a       hot snare. Resection and retrieval were complete. Estimated blood loss:       none.      An 8 mm polyp was found in the descending colon. The polyp was sessile.       The polyp was removed with a cold snare. Resection and retrieval were       complete, but there was copious oozing from the polypectomy site. To       prevent bleeding after the polypectomy, two hemostatic clips were       successfully placed (MR conditional). Clip manufacturer: [employer]. There was no bleeding at the end of the maneuver. Estimated       blood loss was minimal.      Multiple medium-mouthed and small-mouthed diverticula were found in the       sigmoid colon and descending colon. There was no evidence of       diverticular bleeding.       The exam was otherwise normal throughout the examined colon.      The retroflexed view of the distal rectum and anal verge was normal and       showed no anal or rectal abnormalities. Impression:               - Three 5 to 7 mm polyps in the ascending colon,                            removed with a cold snare. Resected and retrieved.                           - Two 5 to 6 mm polyps in the transverse colon,                            removed with a cold snare. Resected and retrieved.                           - One 13 mm polyp in the transverse colon, removed                            with a hot snare. Resected and retrieved.                           - Two 15 to 25 mm polyps in the distal transverse  colon, removed with a hot snare. Resected and                            retrieved.                           - One 8 mm polyp in the descending colon, removed                            with a cold snare. Resected and retrieved. Clips                            (MR conditional) were placed. Clip manufacturer:                            [employer].                           - Moderate diverticulosis in the sigmoid colon and                            in the descending colon. There was no evidence of                            diverticular bleeding.                           - The distal rectum and anal verge are normal on                            retroflexion view. Moderate Sedation:      N/A Recommendation:           - Return patient to hospital ward for possible                            discharge same day.                           - Resume previous diet.                           - Resume Eliquis (apixaban) at prior dose in 2 days.                           - Await pathology results.                           - Repeat colonoscopy (date not yet determined) for                            surveillance based on pathology results. Procedure Code(s):         --- Professional ---                           [id number], Colonoscopy, flexible; with removal of  tumor(s), polyp(s), or other lesion(s) by snare                            technique Diagnosis Code(s):        --- Professional ---                           D12.2, Benign neoplasm of ascending colon                           D12.3, Benign neoplasm of transverse colon (hepatic                            flexure or splenic flexure)                           D12.4, Benign neoplasm of descending colon                           D50.9, Iron deficiency anemia, unspecified                           K57.30, Diverticulosis of large intestine without                            perforation or abscess without bleeding CPT copyright 2022 American Medical Association. All rights reserved. The codes documented in this report are preliminary and upon coder review may  be revised to meet current compliance requirements. Tamel Abel E. Tomasa Rand, MD 09/02/2023 10:23:13 AM This report has been signed electronically. Number of Addenda: 0

## 2023-09-02 NOTE — Op Note (Signed)
[facility] Patient Name: Daniel Butler Procedure Date : 09/02/2023 MRN: [id number] Attending MD: Dub Amis. Tomasa Rand , MD, [id number] Date of Birth: July 27, 1966 CSN: [id number] Age: 57 Admit Type: Inpatient Procedure:                Upper GI endoscopy Indications:              Iron deficiency anemia Providers:                Lorin Picket E. Tomasa Rand, MD, Stephens Shire RN, RN, Alan Ripper, Technician Referring MD:              Medicines:                Monitored Anesthesia Care Complications:            No immediate complications. Estimated Blood Loss:     Estimated blood loss was minimal. Procedure:                Pre-Anesthesia Assessment:                           - Prior to the procedure, a History and Physical                            was performed, and patient medications and                            allergies were reviewed. The patient's tolerance of                            previous anesthesia was also reviewed. The risks                            and benefits of the procedure and the sedation                            options and risks were discussed with the patient.                            All questions were answered, and informed consent                            was obtained. Prior Anticoagulants: The patient has                            taken Eliquis (apixaban), last dose was 3 days                            prior to procedure. ASA Grade Assessment: III - A                            patient with severe systemic disease. After  reviewing the risks and benefits, the patient was                            deemed in satisfactory condition to undergo the                            procedure.                           After obtaining informed consent, the endoscope was                            passed under direct vision. Throughout the                            procedure, the patient's blood  pressure, pulse, and                            oxygen saturations were monitored continuously. The                            [id number]-H190 ([id number]) Olympus endoscope was introduced                            through the mouth, and advanced to the second part                            of duodenum. The upper GI endoscopy was                            accomplished without difficulty. The patient                            tolerated the procedure well. Scope In: Scope Out: Findings:      Scattered islands of salmon-colored mucosa were present. No other       visible abnormalities were present. Biopsies were taken with a cold       forceps for histology. Estimated blood loss was minimal.      The exam of the esophagus was otherwise normal.      The entire examined stomach was normal. Biopsies were taken with a cold       forceps for Helicobacter pylori testing. Estimated blood loss was       minimal.      A single 25-30 mm sessile polyp was found in the second portion of the       duodenum, in the area of the ampulla. Biopsies were taken with a cold       forceps for histology. Estimated blood loss was minimal.      Patchy mildly erythematous mucosa was found in the duodenal bulb.      The exam of the duodenum was otherwise normal.      Biopsies for histology were taken with a cold forceps in the second       portion of the duodenum for evaluation of celiac disease. Estimated       blood loss was minimal. Impression:               -  Salmon-colored mucosa suspicious for Barrett's                            esophagus. Biopsied.                           - Normal stomach. Biopsied.                           - A single large duodenal polyp in the area of the                            ampulla. The ampulla was not seen. 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 Biopsied.                           - Erythematous duodenopathy.                           - Biopsies were taken  with a cold forceps for                            evaluation of celiac disease. Moderate Sedation:      N/A Recommendation:           - Return patient to hospital ward for possible                            discharge same day.                           - Resume previous diet.                           - Resume Eliquis (apixaban) at prior dose in 2 days.                           - Await pathology results.                           - Will likely need ampullectomy. Await biopsy                            results.                           - Unclear if this is a source of iron deficiency                            anemia. Procedure Code(s):        --- Professional ---                           [id number], Esophagogastroduodenoscopy, flexible,  transoral; with biopsy, single or multiple Diagnosis Code(s):        --- Professional ---                           K22.89, Other specified disease of esophagus                           K31.7, Polyp of stomach and duodenum                           K31.89, Other diseases of stomach and duodenum                           D50.9, Iron deficiency anemia, unspecified CPT copyright 2022 [employer]. All rights reserved. The codes documented in this report are preliminary and upon coder review may  be revised to meet current compliance requirements. Kenlee Maler E. Tomasa Rand, MD 09/02/2023 10:11:28 AM This report has been signed electronically. Number of Addenda: 0

## 2023-09-02 NOTE — Progress Notes (Signed)
Physical Therapy Treatment Patient Details Name: Daniel Butler MRN: [id number] DOB: [birth year] Today's Date: 09/02/2023   History of Present Illness The pt is a 57 yo male presenting 12/22 after episode of weakness and being lowered to ground at home. Work up revealed anemia with Hgb to 5.6, CT head neg for acute injury. Attempted EGD 12/23 but canceled due to afib with RVR. PMH includes: CHF, alcohol abuse, anxiety, A-fib on Eliquis, HTN, ruptured brain aneurysm s/p craniotomy and coiling in 2016, gout, tobacco abuse.    PT Comments  Pt much improved from mobility standpoint requiring only minA for transition to EOB and contact guard for OOB to chair with RW. Pt deferred amb due to fatigue but marched in place x50. Pt strongly desires to return home. Per patient and spouse, spouse does a lot for patient at home, like gives a bath in bed, feeds him, and helps him get dressed despite being able to ambulate with his rollator. Pt appears near baselinee and reports only mild dizziness. Acute PT to cont to follow.    If plan is discharge home, recommend the following: Assistance with cooking/housework;A little help with walking and/or transfers;Help with stairs or ramp for entrance   Can travel by private vehicle     No  Equipment Recommendations   (tub bench, possible WC depending on ambulation endurance)    Recommendations for Other Services OT consult     Precautions / Restrictions Precautions Precautions: Fall Restrictions Weight Bearing Restrictions Per Provider Order: No     Mobility  Bed Mobility Overal bed mobility: Needs Assistance Bed Mobility: Supine to Sit     Supine to sit: Min assist     General bed mobility comments: minA for trunk elevation, able to scoot self to EOB, pt with report of dizziness however BP stable 129/71, no lateral sway at EOB    Transfers Overall transfer level: Needs assistance Equipment used: Rolling walker (2 wheels) Transfers: Sit  to/from Stand, Bed to chair/wheelchair/BSC Sit to Stand: Contact guard assist   Step pivot transfers: Contact guard assist       General transfer comment: with max encouragement pt completed step pvt transfer to recliner with use of RW, pt able to power up and safely  maneuver RW to chair with contact guard    Ambulation/Gait               General Gait Details: pt deferred due to "fatigue" however was able to march in place x50 with contact guard, denies dizziness   Stairs             Wheelchair Mobility     Tilt Bed    Modified Rankin (Stroke Patients Only)       Balance Overall balance assessment: Needs assistance Sitting-balance support: Feet supported, No upper extremity supported Sitting balance-Leahy Scale: Good     Standing balance support: Bilateral upper extremity supported, During functional activity, Reliant on assistive device for balance Standing balance-Leahy Scale: Fair Standing balance comment: reliant on RW                            Cognition Arousal: Alert Behavior During Therapy: WFL for tasks assessed/performed Overall Cognitive Status: Within Functional Limits for tasks assessed                                 General Comments: pt engaged in  conversation with PT, able to make needs known however appears self limiting        Exercises      General Comments General comments (skin integrity, edema, etc.): VSS, pt self limiting asking where his wife is because she is suppose to be feeding him his jello. When this PT asked why he couldn't do, pt stated his L arm hurts like crazy, however pt is R handed and was able to feed self without difficulty      Pertinent Vitals/Pain Pain Assessment Pain Assessment: No/denies pain Faces Pain Scale: No hurt    Home Living                          Prior Function            PT Goals (current goals can now be found in the care plan section) Acute  Rehab PT Goals Patient Stated Goal: return home PT Goal Formulation: With patient/family Time For Goal Achievement: 09/15/23 Potential to Achieve Goals: Fair Progress towards PT goals: Progressing toward goals    Frequency    Min 1X/week      PT Plan      Co-evaluation              AM-PAC PT "6 Clicks" Mobility   Outcome Measure  Help needed turning from your back to your side while in a flat bed without using bedrails?: A Little Help needed moving from lying on your back to sitting on the side of a flat bed without using bedrails?: A Little Help needed moving to and from a bed to a chair (including a wheelchair)?: A Little Help needed standing up from a chair using your arms (e.g., wheelchair or bedside chair)?: A Little Help needed to walk in hospital room?: A Little Help needed climbing 3-5 steps with a railing? : A Lot 6 Click Score: 17    End of Session Equipment Utilized During Treatment: Gait belt Activity Tolerance: Patient limited by fatigue Patient left: with call bell/phone within reach;with family/visitor present;in chair Nurse Communication: Mobility status PT Visit Diagnosis: Other abnormalities of gait and mobility (R26.89);Repeated falls (R29.6);Muscle weakness (generalized) (M62.81)     Time: 4098-1191 PT Time Calculation (min) (ACUTE ONLY): 30 min  Charges:    $Gait Training: 8-22 mins $Therapeutic Activity: 8-22 mins PT General Charges $$ ACUTE PT VISIT: 1 Visit                     [name], PT, DPT Acute Rehabilitation Services Secure chat preferred Office #: [phone]    [name] 09/02/2023, 2:41 PM

## 2023-09-02 NOTE — Anesthesia Postprocedure Evaluation (Addendum)
Anesthesia Post Note  Patient: RONY HOEFER  Procedure(s) Performed: ESOPHAGOGASTRODUODENOSCOPY (EGD) COLONOSCOPY BIOPSY POLYPECTOMY HEMOSTASIS CLIP PLACEMENT     Patient location during evaluation: Endoscopy Anesthesia Type: MAC Level of consciousness: patient cooperative, oriented and sedated Pain management: pain level controlled Vital Signs Assessment: post-procedure vital signs reviewed and stable Respiratory status: spontaneous breathing, nonlabored ventilation and respiratory function stable Cardiovascular status: blood pressure returned to baseline and stable Postop Assessment: no apparent nausea or vomiting Anesthetic complications: no   No notable events documented.  Last Vitals:  Vitals:   09/02/23 1030 09/02/23 1120  BP: (!) 117/98 102/86  Pulse: 93 90  Resp: (!) 23 16  Temp:  36.6 C  SpO2: 96%                   Baylee Mccorkel,E. Berneice Zettlemoyer

## 2023-09-02 NOTE — Significant Event (Signed)
TR[facility] night coverage note:  Pt with ongoing a.fib RVR, temp improvement in rate with metoprolol before rate increased again.  2u PRBC transfusion done, repeat HGB pending.  BP 115/83, no hypotension at this point.  Starting cardizem gtt for rate control of a.fib.

## 2023-09-02 NOTE — Anesthesia Preprocedure Evaluation (Addendum)
Anesthesia Evaluation  Patient identified by MRN, date of birth, ID band Patient awake    Reviewed: Allergy & Precautions, NPO status , Patient's Chart, lab work & pertinent test results, reviewed documented beta blocker date and time   History of Anesthesia Complications Negative for: history of anesthetic complications  Airway Mallampati: II  TM Distance: >3 FB Neck ROM: Full    Dental  (+) Poor Dentition, Missing, Chipped, Dental Advisory Given   Pulmonary Current Smoker and Patient abstained from smoking.   breath sounds clear to auscultation       Cardiovascular hypertension, Pt. on medications and Pt. on home beta blockers (-) angina + dysrhythmias Atrial Fibrillation  Rhythm:Irregular Rate:Normal  12/2022  ECHO: EF 45 to 50%.  1. The LV has mildly decreased function, Left ventricular endocardial border not optimally defined to evaluate regional wall motion. Left ventricular diastolic parameters are indeterminate.   3. RVF is mildly reduced. The right ventricular size is normal. There is normal pulmonary artery systolic pressure.   4. The mitral valve is grossly normal. Trivial mitral valve regurgitation.   5. The aortic valve is tricuspid. Aortic valve regurgitation is not visualized. No aortic stenosis is present.     Neuro/Psych  Headaches  Anxiety Depression       GI/Hepatic negative GI ROS,,,(+)     substance abuse  alcohol use  Endo/Other  negative endocrine ROS    Renal/GU Renal InsufficiencyRenal disease     Musculoskeletal   Abdominal   Peds  Hematology  (+) Blood dyscrasia (Hb 9.2), anemia eliquis   Anesthesia Other Findings   Reproductive/Obstetrics                             Anesthesia Physical Anesthesia Plan  ASA: 3  Anesthesia Plan: MAC   Post-op Pain Management: Minimal or no pain anticipated   Induction:   PONV Risk Score and Plan: 0  Airway Management  Planned: Natural Airway and Nasal Cannula  Additional Equipment: None  Intra-op Plan:   Post-operative Plan:   Informed Consent: I have reviewed the patients History and Physical, chart, labs and discussed the procedure including the risks, benefits and alternatives for the proposed anesthesia with the patient or authorized representative who has indicated his/her understanding and acceptance.     Dental advisory given  Plan Discussed with: CRNA and Surgeon  Anesthesia Plan Comments:        Anesthesia Quick Evaluation

## 2023-09-02 NOTE — Progress Notes (Signed)
Patient's heart rate continue tachycardic throughout the shift. Provider aware. Metoprolol administered x2 IV per orders with only minimal effect noted. X2 units blood administered. Hemoglobin level to be drawn post infusion. Heart rate continues elevated 120s, 130s, 140s. Provider aware and orders in place

## 2023-09-02 NOTE — Interval H&P Note (Signed)
History and Physical Interval Note:  09/02/2023 8:45 AM  Daniel Butler  has presented today for surgery, with the diagnosis of Iron deficiency anemia.  The various methods of treatment have been discussed with the patient and family. After consideration of risks, benefits and other options for treatment, the patient has consented to  Procedure(s): ESOPHAGOGASTRODUODENOSCOPY (EGD) (N/A) COLONOSCOPY (N/A) as a surgical intervention.  The patient's history has been reviewed, patient examined, no change in status, stable for surgery.  I have reviewed the patient's chart and labs.  Questions were answered to the patient's satisfaction.     Jenel Lucks

## 2023-09-03 DIAGNOSIS — F10129 Alcohol abuse with intoxication, unspecified: Secondary | ICD-10-CM

## 2023-09-03 DIAGNOSIS — K921 Melena: Secondary | ICD-10-CM

## 2023-09-03 DIAGNOSIS — I4891 Unspecified atrial fibrillation: Secondary | ICD-10-CM

## 2023-09-03 DIAGNOSIS — Z7901 Long term (current) use of anticoagulants: Secondary | ICD-10-CM

## 2023-09-03 DIAGNOSIS — R1310 Dysphagia, unspecified: Secondary | ICD-10-CM

## 2023-09-03 LAB — HEMOGLOBIN AND HEMATOCRIT, BLOOD
HCT: 31 % — ABNORMAL LOW (ref 39.0–52.0)
Hemoglobin: 9.1 g/dL — ABNORMAL LOW (ref 13.0–17.0)

## 2023-09-03 MED ORDER — PANTOPRAZOLE SODIUM 40 MG PO TBEC: 40.0000 mg | DELAYED_RELEASE_TABLET | Freq: Two times a day (BID) | ORAL | Status: DC

## 2023-09-03 MED ORDER — DILTIAZEM HCL ER COATED BEADS 180 MG PO CP24: 180.0000 mg | ORAL_CAPSULE | Freq: Every day | ORAL | 0 refills | Status: DC

## 2023-09-03 MED ORDER — MELATONIN 5 MG PO TABS: 5.0000 mg | ORAL_TABLET | Freq: Every day | ORAL | Status: DC

## 2023-09-03 MED ORDER — PANTOPRAZOLE SODIUM 40 MG PO TBEC: 40.0000 mg | DELAYED_RELEASE_TABLET | Freq: Two times a day (BID) | ORAL | 1 refills | Status: DC

## 2023-09-03 MED ORDER — DILTIAZEM HCL 30 MG PO TABS
30.0000 mg | ORAL_TABLET | Freq: Four times a day (QID) | ORAL | Status: DC | PRN
Start: 2023-09-03 — End: 2023-09-03

## 2023-09-03 MED ORDER — DILTIAZEM HCL ER COATED BEADS 180 MG PO CP24
180.0000 mg | ORAL_CAPSULE | Freq: Every day | ORAL | Status: DC
Start: 2023-09-03 — End: 2023-09-03
  Administered 2023-09-03: 180 mg via ORAL
  Filled 2023-09-03: qty 1

## 2023-09-03 MED ORDER — DILTIAZEM HCL 30 MG PO TABS: 30.0000 mg | ORAL_TABLET | Freq: Four times a day (QID) | ORAL | 0 refills | Status: DC | PRN

## 2023-09-03 NOTE — Progress Notes (Addendum)
PROGRESS NOTE    Daniel Butler  PZW:[id number] DOB: 08/09/1966 DOA: 08/31/2023 PCP: Olive Bass, FNP   Brief Narrative: Daniel Butler is a 57 y.o. 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  Patient presented secondary to after sudden loss of strength in his legs.  Patient found to have evidence of acute alcohol intoxication but also found to have profound anemia of unknown etiology complicated by alcohol use, history of melena, Eliquis use.  Gastroenterology consulted for endoscopic evaluation.  Patient transfused 1 unit of PRBC on admission. Upper and lower endoscopic evaluation did not reveal obvious source for bleeding. Hospitalization complicated by development of atrial fibrillation with RVR requiring diltiazem IV infusion.   Assessment and Plan:  Fall Patient experienced a fall at home in setting of acute alcohol intoxication complicated by associated anemia.  Patient underwent CT of the head, cervical spine, chest, abdomen/pelvis without evidence of acute fracture or hematoma.  PT ordered for evaluation.  Symptomatic anemia Acute anemia Iron deficiency anemia Unclear etiology but patient is high risk for GI bleeding secondary to melena history, alcohol use complicated by Eliquis use.  Imaging negative for hematoma in setting of fall.  Hemoglobin of 5.6 g/dL on admission requiring transfusion of 2 units of PRBC.  Posttransfusion hemoglobin of 8.0 g/dL.  Hemoglobin trended down to 6.8 g/dL necessitating a repeat transfusion of 2 units of PRBC on 12/23 with post-transfusion hemoglobin of 9.2. Upper and lower endoscopies without obvious etiology for iron deficiency anemia. Hemoglobin remains stable.  Possible Barrett's esophagus Noted on EGD. Biopsied.  Duodenal polyp Noted on EGD. Biopsied. GI recommendation  to await pathology and possibly refer for ampullectomy  Colonic polyps Eight polyps noted on colonoscopy which were removed and retrieved.   Moderate diverticulosis Noted in sigmoid colon and descending colon. Noted on colonoscopy.  Persistent atrial fibrillation with RVR Patient is managed on metoprolol as an outpatient and is also on Eliquis for stroke prevention.  Patient developed RVR while in the hospital likely secondary to missed metoprolol dosing.  Endoscopy procedure postponed secondary to RVR. Patient required initiation of diltiazem drip for management with improvement of heart rates. Recurrent RVR once diltiazem weaned off. -Continue metoprolol succinate 50 mg daily -Restart diltiazem IV -Cardiology consult -Restart Eliquis on 12/26  Dysphagia Patient with concerns for food and pills getting stuck. Speech therapy consulted and GI consulted. Plan for upper endoscopy by GI to evaluate for etiology.  AKI Baseline creatinine appears to be around 1.2-1.4 from metabolic panel one year prior. Creatinine of 1.8 on admission with improvement to 1.51. Possibly related to hypervolemia  Acute on chronic HFrEF LVEF of 45-50%. Present on admission with evidence of lower extremity edema. Patient given Lasix IV on admission. BNP elevated at 200. Currently on room air.  Hypokalemia Mild. Resolved with potassium supplementation.  Alcohol abuse with acute intoxication Alcohol level of 287 on admission. Patient reports frequent alcohol intake and has been cutting down. Denies history of alcohol withdrawal but notes history of "shakes." He has been managed on Ativan while admitted in the past, in addition to Librium. 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 Noted.  Tobacco use Counseled on admission.  Umbilical hernia Reducible. No symptoms.  Insomnia -Continue Trazodone -Start melatonin at bedtime    DVT  prophylaxis: SCDs Code Status:   Code Status: Full  Code Family Communication: None at bedside. Disposition Plan: Discharge home likely in 1 day pending improvement of atrial fibrillation with RVR   Consultants:  [facility]  Procedures:  Upper GI endoscopy Colonoscopy  Antimicrobials: None    Subjective: Patient reports trouble with getting sleep. Otherwise, no concerns.  Objective: BP 111/71   Pulse 64   Temp 97.8 F (36.6 C) (Oral)   Resp 19   Ht 6\' 1"  (1.854 m)   Wt 120.5 kg   SpO2 95%   BMI 35.04 kg/m   Examination:  General exam: Appears calm and comfortable Respiratory system: Clear to auscultation. Respiratory effort normal. Cardiovascular system: S1 & S2 heard, irregular rate with fast rhythm. Gastrointestinal system: Abdomen is nondistended, soft and nontender. Normal bowel sounds heard. Central nervous system: Alert and oriented. No focal neurological deficits. Musculoskeletal: No edema. No calf tenderness Psychiatry: Judgement and insight appear normal. Mood & affect appropriate.    Data Reviewed: I have personally reviewed following labs and imaging studies  CBC Lab Results  Component Value Date   WBC 9.1 09/02/2023   RBC 3.77 (L) 09/02/2023   HGB 9.1 (L) 09/03/2023   HCT 31.0 (L) 09/03/2023   MCV 82.0 09/02/2023   MCH 24.4 (L) 09/02/2023   PLT 173 09/02/2023   MCHC 29.8 (L) 09/02/2023   RDW 18.1 (H) 09/02/2023   LYMPHSABS 2.2 08/31/2023   MONOABS 0.5 08/31/2023   EOSABS 0.2 08/31/2023   BASOSABS 0.0 08/31/2023     Last metabolic panel Lab Results  Component Value Date   NA 143 09/02/2023   K 3.8 09/02/2023   CL 114 (H) 09/02/2023   CO2 19 (L) 09/02/2023   BUN 10 09/02/2023   CREATININE 1.51 (H) 09/02/2023   GLUCOSE 96 09/02/2023   GFRNONAA 53 (L) 09/02/2023   GFRAA >60 02/16/2016   CALCIUM 8.1 (L) 09/02/2023   PHOS 3.5 08/31/2023   PROT 4.6 (L) 09/01/2023   ALBUMIN 2.0 (L) 09/01/2023   LABGLOB 2.5  04/22/2022   AGRATIO 1.5 04/22/2022   BILITOT 1.0 09/01/2023   ALKPHOS 58 09/01/2023   AST 18 09/01/2023   ALT 12 09/01/2023   ANIONGAP 10 09/02/2023    GFR: Estimated Creatinine Clearance: 72.5 mL/min (A) (by C-G formula based on SCr of 1.51 mg/dL (H)).  No results found for this or any previous visit (from the past 240 hours).    Radiology Studies: No results found.     LOS: 2 days    Jacquelin Hawking, MD [facility] 09/03/2023, 9:02 AM   If 7PM-7AM, please contact night-coverage [email or web address]

## 2023-09-03 NOTE — Consult Note (Signed)
Cardiology Consultation   Patient ID: Daniel Butler MRN: [id number]; DOB: Sep 15, 1965  Admit date: 08/31/2023 Date of Consult: 09/03/2023  PCP:  Olive Bass, FNP   [facility] Providers Cardiologist:  Chrystie Nose, MD        Patient Profile:   Daniel Butler is a 57 y.o. 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 Caleb Popp.  History of Present Illness:   Daniel Butler is here with acute alcohol intoxication and anemia.  He was seen by gastroenterology who conducted an endoscopy with no obvious source of bleeding.  He was transfused 1 unit of PRBC.  He has had atrial fibrillation relatively rate controlled he was started on a dill drip.  The plan was for him to be discharged today however rates have been in the 120s therefore cardiology was consulted.   Past Medical History:  Diagnosis Date   Brain aneurysm    Chicken pox    Depression    Elevated LFTs    Gout    Hernia of abdominal cavity 11/2020   High cholesterol    Hypertension     Past Surgical History:  Procedure Laterality Date   CARDIOVERSION N/A 01/22/2021   Procedure: CARDIOVERSION;  Surgeon: Meriam Sprague, MD;  Location: [facility] ENDOSCOPY;  Service: Cardiovascular;  Laterality: N/A;   CRANIOTOMY Right 03/02/2015   Procedure: CRANIOTOMY INTRACRANIAL  ANEURYSM FOR CLIPPING;  Surgeon: Lisbeth Renshaw, MD;  Location: [facility] NEURO ORS;  Service: Neurosurgery;  Laterality: Right;   IR GENERIC HISTORICAL  02/16/2016   IR ANGIO VERTEBRAL SEL VERTEBRAL BILAT MOD SED 02/16/2016 Lisbeth Renshaw, MD [facility]-INTERV RAD   IR GENERIC HISTORICAL  02/16/2016   IR ANGIO INTRA  EXTRACRAN SEL INTERNAL CAROTID BILAT MOD SED 02/16/2016 Lisbeth Renshaw, MD [facility]   RADIOLOGY WITH ANESTHESIA N/A 03/02/2015   Procedure: RADIOLOGY WITH ANESTHESIA;  Surgeon: Lisbeth Renshaw, MD;  Location: [facility] OR;  Service: Radiology;  Laterality: N/A;   RIGHT/LEFT HEART CATH AND CORONARY ANGIOGRAPHY N/A 12/19/2020   Procedure: RIGHT/LEFT HEART CATH AND CORONARY ANGIOGRAPHY;  Surgeon: Swazil[place], Peter M, MD;  Location: [facility] INVASIVE CV LAB;  Service: Cardiovascular;  Laterality: N/A;   WISDOM TOOTH EXTRACTION       Home Medications:  Prior to Admission medications   Medication Sig Start Date End Date Taking? Authorizing Provider  allopurinol (ZYLOPRIM) 100 MG tablet Take 1 tablet (100 mg total) by mouth daily. 06/16/23  Yes Olive Bass, FNP  ALPRAZolam Prudy Feeler) 0.5 MG tablet TAKE 1 TABLET BY MOUTH THREE TIMES A DAY AS NEEDED FOR ANXIETY 08/14/23  Yes Olive Bass, FNP  apixaban (ELIQUIS) 5 MG TABS tablet Take 1 tablet (5 mg total) by mouth 2 (two) times daily. 01/10/23  Yes Chrystie Nose, MD  fenofibrate (TRICOR) 145 MG tablet Take 1 tablet by mouth once daily 07/22/23  Yes Olive Bass, FNP  folic acid (FOLVITE) 1 MG tablet Take 1 tablet (1 mg total) by mouth daily. 06/16/23  Yes Olive Bass, FNP  furosemide (LASIX) 40 MG tablet Take 1 tablet by mouth once daily 07/23/23  Yes Hilty, Lisette Abu, MD  icosapent Ethyl (VASCEPA) 1 g capsule Take  2 capsules (2 g total) by mouth 2 (two) times daily. 03/25/23  Yes Hilty, Lisette Abu, MD  metoprolol succinate (TOPROL-XL) 50 MG 24 hr tablet Take 1 tablet (50 mg total) by mouth daily. Take with or immediately following a meal. 07/22/23  Yes Hilty, Lisette Abu, MD  rosuvastatin (CRESTOR) 40 MG tablet Take 1 tablet (40 mg total) by mouth daily. 07/22/23  Yes Hilty, Lisette Abu, MD  thiamine (VITAMIN B-1) 100 MG tablet Take 100 mg by mouth daily.   Yes [provider]  traZODone (DESYREL) 50 MG tablet TAKE 1/2 TO 1  (ONE-HALF TO ONE) TABLET BY MOUTH AT BEDTIME AS NEEDED FOR SLEEP 08/14/23  Yes Olive Bass, FNP  pantoprazole (PROTONIX) 40 MG tablet Take 1 tablet (40 mg total) by mouth daily. Patient needs follow up appointment for future refills. Please call [phone] to schedule an appointment. Patient not taking: Reported on 08/31/2023 06/16/23   Meredith Pel, NP    Inpatient Medications: Scheduled Meds:  allopurinol  100 mg Oral Daily   chlordiazePOXIDE  25 mg Oral BH-qamhs   Followed by   Melene Muller ON 09/04/2023] chlordiazePOXIDE  25 mg Oral Daily   fenofibrate  160 mg Oral Daily   folic acid  1 mg Oral Daily   melatonin  5 mg Oral QHS   metoprolol succinate  50 mg Oral Daily   multivitamin with minerals  1 tablet Oral Daily   pantoprazole  40 mg Oral BID   rosuvastatin  40 mg Oral Daily   sodium chloride flush  3 mL Intravenous Q12H   thiamine  100 mg Oral Daily   Or   thiamine  100 mg Intravenous Daily   Continuous Infusions:  diltiazem (CARDIZEM) infusion 5 mg/hr (09/03/23 0849)   PRN Meds: albuterol, metoprolol tartrate, ondansetron **OR** ondansetron (ZOFRAN) IV, mouth rinse, traZODone  Allergies:    Allergies  Allergen Reactions   Zetia [Ezetimibe] Nausea And Vomiting    Social History:   Social History   Socioeconomic History   Marital status: Married    Spouse name: Daniel Butler   Number of children: 0   Years of education: 12   Highest education level: Associate degree: academic program  Occupational History   Occupation: unemployed    Comment: former Advice worker.   Tobacco Use   Smoking status: Every Day    Current packs/day: 2.50    Average packs/day: 2.5 packs/day for 25.0 years (62.5 ttl pk-yrs)    Types: Cigarettes   Smokeless tobacco: Never  Vaping Use   Vaping status: Never Used  Substance and Sexual Activity   Alcohol use: Yes    Alcohol/week: 70.0 standard drinks of alcohol    Types: 35 Cans of beer, 35 Shots of liquor  per week   Drug use: No   Sexual activity: Not Currently    Partners: Female    Birth control/protection: None  Other Topics Concern   Not on file  Social History Narrative   Denies religious beliefs effecting health care.    Social Drivers of Health   Financial Resource Strain: High Risk (12/20/2022)   Overall Financial Resource Strain (CARDIA)    Difficulty of Paying Living Expenses: Hard  Food Insecurity: No Food Insecurity (08/31/2023)   Hunger Vital Sign    Worried About Running Out of Food in the Last Year: Never true    Ran Out of Food in the Last Year: Never true  Transportation Needs: No Transportation Needs (08/31/2023)   PRAPARE -  Administrator, Civil Service (Medical): No    Lack of Transportation (Non-Medical): No  Physical Activity: Unknown (12/20/2022)   Exercise Vital Sign    Days of Exercise per Week: Patient declined    Minutes of Exercise per Session: Not on file  Stress: Stress Concern Present (12/20/2022)   [employer] of Occupational Health - Occupational Stress Questionnaire    Feeling of Stress : Very much  Social Connections: Socially Isolated (12/20/2022)   Social Connection and Isolation Panel [NHANES]    Frequency of Communication with Friends and Family: Once a week    Frequency of Social Gatherings with Friends and Family: Never    Attends Religious Services: Never    Database administrator or Organizations: No    Attends Engineer, structural: Not on file    Marital Status: Married  Catering manager Violence: Not At Risk (08/31/2023)   Humiliation, Afraid, Rape, and Kick questionnaire    Fear of Current or Ex-Partner: No    Emotionally Abused: No    Physically Abused: No    Sexually Abused: No    Family History:    Family History  Adopted: Yes     ROS:  Please see the history of present illness.  All other ROS reviewed and negative.     Physical Exam/Data:   Vitals:   09/03/23 0451 09/03/23 0729 09/03/23  0734 09/03/23 0858  BP: 138/81 104/74  111/71  Pulse:  64    Resp: 15 (!) 25 16 19   Temp: 97.6 F (36.4 C) 97.8 F (36.6 C)    TempSrc: Oral Oral    SpO2: 96% 95%    Weight: 120.5 kg     Height:        Intake/Output Summary (Last 24 hours) at 09/03/2023 1023 Last data filed at 09/02/2023 2135 Gross per 24 hour  Intake --  Output 50 ml  Net -50 ml      09/03/2023    4:51 AM 09/02/2023    5:00 AM 09/01/2023    5:43 AM  Last 3 Weights  Weight (lbs) 265 lb 9.6 oz 254 lb 6.6 oz 262 lb 2 oz  Weight (kg) 120.475 kg 115.4 kg 118.9 kg     Body mass index is 35.04 kg/m.  General:  Well nourished, well developed, in no acute distress HEENT: normal Neck: no JVD Vascular: No carotid bruits; Distal pulses 2+ bilaterally Cardiac: Normal S1, S2; irregular rate and rhythm, no murmurs Lungs:  clear to auscultation bilaterally, no wheezing, rhonchi or rales  Abd: soft, nontender, no hepatomegaly  Ext: no edema Musculoskeletal:  No deformities, BUE and BLE strength normal and equal Skin: warm and dry  Neuro:  CNs 2-12 intact, no focal abnormalities noted Psych:  Normal affect   EKG:  The EKG was personally reviewed and demonstrates: No new  Telemetry:  Telemetry was personally reviewed and demonstrates: A-fib heart rates into the 120s  Relevant CV Studies: TTE 01/06/2023 1. Difficult study due to poor visualization of the LV endocardium.   2. Left ventricular ejection fraction, by estimation, is 45 to 50%. The  left ventricle has mildly decreased function. Left ventricular endocardial  border not optimally defined to evaluate regional wall motion. Left  ventricular diastolic parameters are  indeterminate.   3. Right ventricular systolic function is mildly reduced. The right  ventricular size is normal. There is normal pulmonary artery systolic  pressure.   4. The mitral valve is grossly normal. Trivial mitral valve  regurgitation.   5. The aortic valve is tricuspid. Aortic  valve regurgitation is not  visualized. No aortic stenosis is present.   6. Aortic dilatation noted. 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   7. The inferior vena cava is normal in size with greater than 50%  respiratory variability, suggesting right atrial pressure of 3 mmHg.    Laboratory Data:  High Sensitivity Troponin:  No results for input(s): "TROPONINIHS" in the last 720 hours.   Chemistry Recent Labs  Lab 08/31/23 0504 08/31/23 1115 09/01/23 0558 09/02/23 0239  NA 140  --  139 143  K 3.2*  --  3.4* 3.8  CL 108  --  111 114*  CO2 20*  --  22 19*  GLUCOSE 111*  --  93 96  BUN 11  --  10 10  CREATININE 1.80*  --  1.69* 1.51*  CALCIUM 8.0*  --  7.3* 8.1*  MG  --  1.9  --   --   GFRNONAA 43*  --  46* 53*  ANIONGAP 12  --  6 10    Recent Labs  Lab 08/31/23 0504 09/01/23 0558  PROT 5.6* 4.6*  ALBUMIN 2.5* 2.0*  AST 29 18  ALT 16 12  ALKPHOS 57 58  BILITOT 0.4 1.0   Lipids No results for input(s): "CHOL", "TRIG", "HDL", "LABVLDL", "LDLCALC", "CHOLHDL" in the last 168 hours.  Hematology Recent Labs  Lab 08/31/23 0504 08/31/23 1520 09/01/23 0558 09/01/23 1558 09/02/23 0239 09/02/23 1725 09/03/23 0821  WBC 7.0  --  8.0  --  9.1  --   --   RBC 2.64*  2.64*  --  3.10*  --  3.77*  --   --   HGB 5.6*   < > 7.2*   < > 9.2* 8.5* 9.1*  HCT 21.2*   < > 24.4*   < > 30.9* 28.9* 31.0*  MCV 80.3  --  78.7*  --  82.0  --   --   MCH 21.2*  --  23.2*  --  24.4*  --   --   MCHC 26.4*  --  29.5*  --  29.8*  --   --   RDW 19.3*  --  18.4*  --  18.1*  --   --   PLT 225  --  165  --  173  --   --    < > = values in this interval not displayed.   Thyroid No results for input(s): "TSH", "FREET4" in the last 168 hours.  BNP Recent Labs  Lab 08/31/23 0504  BNP 200.6*    DDimer No results for input(s): "DDIMER" in the last 168 hours.   Radiology/Studies:  CT CHEST ABDOMEN PELVIS W CONTRAST Result Date: 08/31/2023 CLINICAL DATA:  Poly  trauma. Status post fall striking head on sink. On anticoagulation therapy. EXAM: CT CHEST, ABDOMEN, AND PELVIS WITH CONTRAST TECHNIQUE: Multidetector CT imaging of the chest, abdomen and pelvis was performed following the standard protocol during bolus administration of intravenous contrast. RADIATION DOSE REDUCTION: This exam was performed according to the departmental dose-optimization program which includes automated exposure control, adjustment of the mA and/or kV according to patient size and/or use of iterative reconstruction technique. CONTRAST:  75mL OMNIPAQUE IOHEXOL 350 MG/ML SOLN COMPARISON:  12/15/2020 FINDINGS: CT CHEST FINDINGS Cardiovascular: Heart size is upper limits of normal. There is no pericardial effusion. Mild aortic atherosclerosis. Lad coronary artery calcifications. Mediastinum/Nodes: No enlarged mediastinal, hilar, or  axillary lymph nodes. Thyroid gland, trachea, and esophagus demonstrate no significant findings. Lungs/Pleura: There is a small right pleural effusion, image 52/3. There is ground-glass attenuation and subpleural consolidation within the right lower lobe, image 138/7. No pneumothorax identified. Stable left upper lobe lung nodule measuring 4 mm, image 51/7. Musculoskeletal: No chest wall mass or suspicious bone lesions identified. No fractures. CT ABDOMEN PELVIS FINDINGS Hepatobiliary: No hepatic injury or perihepatic hematoma. Gallbladder is unremarkable. Pancreas: Unremarkable. No pancreatic ductal dilatation or surrounding inflammatory changes. Spleen: No splenic injury or perisplenic hematoma. Adrenals/Urinary Tract: No adrenal hemorrhage or renal injury identified. Bladder is unremarkable. Upper pole left kidney cyst measures 10 mm and 21 Hounsfield units compatible with a benign Bosniak class 2 cyst. No follow-up imaging recommended. Stomach/Bowel: Stomach is within normal limits. Appendix appears normal. No evidence of bowel wall thickening, distention, or  inflammatory changes. Vascular/Lymphatic: Aortic atherosclerosis. No enlarged abdominal or pelvic lymph nodes. Reproductive: Mild prostate gland enlargement Other: No free fluid or fluid collections. Fat containing umbilical hernia. Musculoskeletal: Areas of subcutaneous soft tissue stranding are noted along bilateral flanks and overlying both hips. No acute or suspicious osseous findings. L4-5 degenerative disc disease. IMPRESSION: 1. Small right pleural effusion. 2. Ground-glass attenuation and subpleural consolidation within the right lower lobe. Differential considerations include pneumonia, aspiration, or in the acute posttraumatic setting pulmonary contusion. No associated overlying rib fractures identified. 3. Coronary artery calcifications. 4. Stable 4 mm left upper lobe lung nodule. No follow-up imaging recommended. 5. Areas of subcutaneous soft tissue stranding are noted along bilateral flanks and overlying both hips. Findings may reflect anasarca or areas of superficial contusion. Clinical correlation advised. 6.  Aortic Atherosclerosis (ICD10-I70.0). Electronically Signed   By: Signa Kell M.D.   On: 08/31/2023 07:07   CT HEAD WO CONTRAST ( ) Result Date: 08/31/2023 CLINICAL DATA:  Blunt poly trauma.  Head trauma on Eliquis. EXAM: CT HEAD WITHOUT CONTRAST CT CERVICAL SPINE WITHOUT CONTRAST TECHNIQUE: Multidetector CT imaging of the head and cervical spine was performed following the standard protocol without intravenous contrast. Multiplanar CT image reconstructions of the cervical spine were also generated. RADIATION DOSE REDUCTION: This exam was performed according to the departmental dose-optimization program which includes automated exposure control, adjustment of the mA and/or kV according to patient size and/or use of iterative reconstruction technique. COMPARISON:  12/15/2020 FINDINGS: CT HEAD FINDINGS Brain: No evidence of acute infarction, hemorrhage, hydrocephalus, extra-axial  collection or mass lesion/mass effect. 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 Encephalomalacia in the right frontal lobe beneath a burr hole for presumed ventricular drain. No new finding Vascular: Right-sided aneurysm clip as noted above. Atheromatous calcification. Skull: Unremarkable right pterional craniotomy Sinuses/Orbits: No evidence of injury CT CERVICAL SPINE FINDINGS Alignment: Normal. Skull base and vertebrae: No acute fracture. No primary bone lesion or focal pathologic process. Soft tissues and spinal canal: No prevertebral fluid or swelling. No visible canal hematoma. Disc levels:  No significant degeneration. Upper chest: Reported separately IMPRESSION: No evidence of acute intracranial or cervical spine injury. Electronically Signed   By: Tiburcio Pea M.D.   On: 08/31/2023 06:55   CT Cervical Spine Wo Contrast Result Date: 08/31/2023 CLINICAL DATA:  Blunt poly trauma.  Head trauma on Eliquis. EXAM: CT HEAD WITHOUT CONTRAST CT CERVICAL SPINE WITHOUT CONTRAST TECHNIQUE: Multidetector CT imaging of the head and cervical spine was performed following the standard protocol without intravenous contrast. Multiplanar CT image reconstructions of the cervical spine were also generated. RADIATION DOSE REDUCTION: This exam was  performed according to the departmental dose-optimization program which includes automated exposure control, adjustment of the mA and/or kV according to patient size and/or use of iterative reconstruction technique. COMPARISON:  12/15/2020 FINDINGS: CT HEAD FINDINGS Brain: No evidence of acute infarction, hemorrhage, hydrocephalus, extra-axial collection or mass lesion/mass effect. 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 Encephalomalacia in the right frontal lobe beneath a burr hole for presumed ventricular drain. No new finding Vascular: Right-sided aneurysm clip as noted above. Atheromatous calcification. Skull:  Unremarkable right pterional craniotomy Sinuses/Orbits: No evidence of injury CT CERVICAL SPINE FINDINGS Alignment: Normal. Skull base and vertebrae: No acute fracture. No primary bone lesion or focal pathologic process. Soft tissues and spinal canal: No prevertebral fluid or swelling. No visible canal hematoma. Disc levels:  No significant degeneration. Upper chest: Reported separately IMPRESSION: No evidence of acute intracranial or cervical spine injury. Electronically Signed   By: Tiburcio Pea M.D.   On: 08/31/2023 06:55     Assessment and Plan:   Paroxysmal atrial fibrillation He is asymptomatic and his heart rates are in the 120s.  He can tolerate this.  Will recommend diltiazem 180 mg daily, Cardizem 30 mg every 6 hours as needed for heart rates greater than 120, continue metoprolol succinate 50 mg daily.  His anticoagulation is up to GI.  Ultimately his A-fib is in the setting of heavy drinking; he needs to refrain from alcohol.  We can work on a close follow-up for him, otherwise he does not need to be inpatient.  Elevated triglycerides Continue statin therapy Follows with [name]   Time Spent Directly with Patient:  I have spent a total of 60  minutes with the patient reviewing hospital notes, telemetry, EKGs, labs and examining the patient as well as establishing an assessment and plan that was discussed personally with the patient.     Risk Assessment/Risk Scores:       For questions or updates, please contact [facility] Please consult [email or web address] for contact info under    Signed, Maisie Fus, MD  09/03/2023 10:23 AM

## 2023-09-03 NOTE — Discharge Summary (Signed)
Physician Discharge Summary   Patient: Daniel Butler MRN: [id number] DOB: 20-Sep-1965  Admit date:     08/31/2023  Discharge date: 09/03/23  Discharge Physician: Jacquelin Hawking, MD   PCP: Olive Bass, FNP   Recommendations at discharge:  PCP visit for hospital follow-up GI visit for biopsy results and hospital follow-up Cardiology visit for management of atrial fibrillation with RVR and hospital follow-up Repeat CBC/BMP in 3-5 days  Discharge Diagnoses: Principal Problem:   Fall at home, initial encounter Active Problems:   Iron deficiency anemia   Dysphagia   AKI (acute kidney injury) (HCC)   Hypoalbuminemia   Bilateral lower extremity edema   Heart failure with reduced ejection fraction (HCC)   Hypokalemia   Atrial fibrillation (HCC)   Alcohol abuse with intoxication (HCC)   History of subarachnoid hemorrhage   Tobacco abuse   Umbilical hernia   Duodenal mass   Benign neoplasm of colon  Resolved Problems:   * No resolved hospital problems. *  Hospital Course: TRAVONTAE CALAFIORE is a 57 y.o. 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  Patient presented secondary to after sudden loss of strength in his legs.  Patient found to have evidence of acute alcohol intoxication but also found to have profound anemia of unknown etiology complicated by alcohol use, history of melena, Eliquis use.  Gastroenterology consulted for endoscopic evaluation.  Patient transfused 1 unit of PRBC on admission. Upper and lower endoscopic evaluation did not reveal obvious source for bleeding. Hospitalization complicated by development of atrial fibrillation with RVR requiring diltiazem IV infusion.  Assessment and Plan:  Fall Patient experienced a fall at home in setting of acute alcohol intoxication complicated by associated  anemia.  Patient underwent CT of the head, cervical spine, chest, abdomen/pelvis without evidence of acute fracture or hematoma.  PT ordered for evaluation.   Symptomatic anemia Acute anemia Iron deficiency anemia Unclear etiology but patient is high risk for GI bleeding secondary to melena history, alcohol use complicated by Eliquis use.  Imaging negative for hematoma in setting of fall.  Hemoglobin of 5.6 g/dL on admission requiring transfusion of 2 units of PRBC.  Posttransfusion hemoglobin of 8.0 g/dL.  Hemoglobin trended down to 6.8 g/dL necessitating a repeat transfusion of 2 units of PRBC on 12/23 with post-transfusion hemoglobin of 9.2. Upper and lower endoscopies without obvious etiology for iron deficiency anemia. Hemoglobin of 9.1 on day of discharge and remains stable.   Possible Barrett's esophagus Noted on EGD. Biopsied.   Duodenal polyp Noted on EGD. Biopsied. GI recommendation to await pathology and possibly refer for ampullectomy   Colonic polyps Eight polyps noted on colonoscopy which were removed and retrieved.    Moderate diverticulosis Noted in sigmoid colon and descending colon. Noted on colonoscopy.   Persistent atrial fibrillation with RVR Unspecified type. Patient is managed on metoprolol as an outpatient and is also on Eliquis for stroke prevention.  Patient developed RVR while in the hospital likely secondary to missed metoprolol dosing.  Endoscopy procedure postponed secondary to RVR. Patient required initiation of diltiazem drip for management with improvement of heart rates. Recurrent RVR once diltiazem weaned off. Cardiology consulted and recommended addition of Cardizem PO to outpatient regimen. Patient discharged on metoprolol succinate 50 mg daily and Cardizem 180 mg daily with Cardizem 30 mg as needed. Gi recommendation to restart Eliquis on 12/26.   Dysphagia Patient  with concerns for food and pills getting stuck. Speech therapy consulted and GI consulted.  Upper endoscopy significant for possible Barrett.s esophagus. Biopsy pending. Discharge on Protonix BID.   AKI Baseline creatinine appears to be around 1.2-1.4 from metabolic panel one year prior. Creatinine of 1.8 on admission with improvement to 1.51. Possibly related to hypervolemia   Acute on chronic HFrEF LVEF of 45-50%. Present on admission with evidence of lower extremity edema. Patient given Lasix IV on admission. BNP elevated at 200. Currently on room air.   Hypokalemia Mild. Resolved with potassium supplementation.   Alcohol abuse with acute intoxication Alcohol level of 287 on admission. Patient reports frequent alcohol intake and has been cutting down. Denies history of alcohol withdrawal but notes history of "shakes." He has been managed on Ativan while admitted in the past, in addition to Librium. -Librium with taper -Continue CIWA   History of subarachnoid hemorrhage Remote history. Residual left-sided weakness. Noted.   Tobacco use Counseled on admission.   Umbilical hernia Reducible. No symptoms.   Insomnia -Continue Trazodone -Start melatonin at bedtime   Consultants:  [facility]   Procedures:  Upper GI endoscopy Colonoscopy  Disposition: Home Diet recommendation: Cardiac diet   DISCHARGE MEDICATION: Allergies as of 09/03/2023       Reactions   Zetia [ezetimibe] Nausea And Vomiting        Medication List     PAUSE taking these medications    apixaban 5 MG Tabs tablet Wait to take this until: September 04, 2023 Morning Commonly known as: ELIQUIS Take 1 tablet (5 mg total) by mouth 2 (two) times daily.       TAKE these medications    allopurinol 100 MG tablet Commonly known as: ZYLOPRIM Take 1 tablet (100 mg total) by mouth daily.   ALPRAZolam 0.5 MG tablet Commonly known as: XANAX TAKE 1 TABLET BY MOUTH THREE TIMES A DAY AS NEEDED FOR ANXIETY   diltiazem 180 MG 24 hr capsule Commonly known as: CARDIZEM  CD Take 1 capsule (180 mg total) by mouth daily. Start taking on: September 04, 2023   diltiazem 30 MG tablet Commonly known as: CARDIZEM Take 1 tablet (30 mg total) by mouth every 6 (six) hours as needed (heart rates > 120. Hold for systolic blood pressure less than 100 mmHg.).   fenofibrate 145 MG tablet Commonly known as: TRICOR Take 1 tablet by mouth once daily   folic acid 1 MG tablet Commonly known as: FOLVITE Take 1 tablet (1 mg total) by mouth daily.   furosemide 40 MG tablet Commonly known as: LASIX Take 1 tablet by mouth once daily   icosapent Ethyl 1 g capsule Commonly known as: Vascepa Take 2 capsules (2 g total) by mouth 2 (two) times daily.   metoprolol succinate 50 MG 24 hr tablet Commonly known as: TOPROL-XL Take 1 tablet (50 mg total) by mouth daily. Take with or immediately following a meal.   pantoprazole 40 MG tablet Commonly known as: PROTONIX Take 1 tablet (40 mg total) by mouth 2 (two) times daily. What changed:  when to take this additional instructions   rosuvastatin 40 MG tablet Commonly known as: CRESTOR Take 1 tablet (40 mg total) by mouth daily.   thiamine 100 MG tablet Commonly known as: Vitamin B-1 Take 100 mg by mouth daily.   traZODone 50 MG tablet Commonly known as: DESYREL TAKE 1/2 TO 1 (ONE-HALF TO ONE) TABLET BY MOUTH AT BEDTIME AS NEEDED FOR SLEEP  Follow-up Information     [facility] Follow up.   Specialty: Rehabilitation Why: Please call the Outpatient center and follow up regarding needed Outpatient therapy. Contact information: [address] [place] [address] [phone]        Olive Bass, FNP. Schedule an appointment as soon as possible for a visit in 1 week(s).   Specialty: Internal Medicine Why: For hospital follow-up Contact information: [address] [place] [address] [phone]          Jenel Lucks, MD. Schedule an appointment as soon as possible for a visit.   Specialty: Gastroenterology Why: Biopsy results, For hospital follow-up Contact information: [address] [place] [address] [phone]         Chrystie Nose, MD. Schedule an appointment as soon as possible for a visit.   Specialty: Cardiology Why: Atrial fibrillation, For hospital follow-up Contact information: [address] [place] [address] [phone]                Discharge Exam: BP 102/63 (BP Location: Left Arm)   Pulse 90   Temp 97.6 F (36.4 C) (Oral)   Resp 19   Ht 6\' 1"  (1.854 m)   Wt 120.5 kg   SpO2 95%   BMI 35.04 kg/m   General exam: Appears calm and comfortable Respiratory system: Clear to auscultation. Respiratory effort normal. Cardiovascular system: S1 & S2 heard, irregular rate with fast rhythm. Gastrointestinal system: Abdomen is nondistended, soft and nontender. Normal bowel sounds heard. Central nervous system: Alert and oriented. No focal neurological deficits. Musculoskeletal: No edema. No calf tenderness Psychiatry: Judgement and insight appear normal. Mood & affect appropriate.  Condition at discharge: stable  The results of significant diagnostics from this hospitalization (including imaging, microbiology, ancillary and laboratory) are listed below for reference.   Imaging Studies: CT CHEST ABDOMEN PELVIS W CONTRAST Result Date: 08/31/2023 CLINICAL DATA:  Poly trauma. Status post fall striking head on sink. On anticoagulation therapy. EXAM: CT CHEST, ABDOMEN, AND PELVIS WITH CONTRAST TECHNIQUE: Multidetector CT imaging of the chest, abdomen and pelvis was performed following the standard protocol during bolus administration of intravenous contrast. RADIATION DOSE REDUCTION: This exam was performed according to the departmental dose-optimization program which includes automated exposure control, adjustment of the mA and/or  kV according to patient size and/or use of iterative reconstruction technique. CONTRAST:  75mL OMNIPAQUE IOHEXOL 350 MG/ML SOLN COMPARISON:  12/15/2020 FINDINGS: CT CHEST FINDINGS Cardiovascular: Heart size is upper limits of normal. There is no pericardial effusion. Mild aortic atherosclerosis. Lad coronary artery calcifications. Mediastinum/Nodes: No enlarged mediastinal, hilar, or axillary lymph nodes. Thyroid gland, trachea, and esophagus demonstrate no significant findings. Lungs/Pleura: There is a small right pleural effusion, image 52/3. There is ground-glass attenuation and subpleural consolidation within the right lower lobe, image 138/7. No pneumothorax identified. Stable left upper lobe lung nodule measuring 4 mm, image 51/7. Musculoskeletal: No chest wall mass or suspicious bone lesions identified. No fractures. CT ABDOMEN PELVIS FINDINGS Hepatobiliary: No hepatic injury or perihepatic hematoma. Gallbladder is unremarkable. Pancreas: Unremarkable. No pancreatic ductal dilatation or surrounding inflammatory changes. Spleen: No splenic injury or perisplenic hematoma. Adrenals/Urinary Tract: No adrenal hemorrhage or renal injury identified. Bladder is unremarkable. Upper pole left kidney cyst measures 10 mm and 21 Hounsfield units compatible with a benign Bosniak class 2 cyst. No follow-up imaging recommended. Stomach/Bowel: Stomach is within normal limits. Appendix appears normal. No evidence of bowel wall thickening,  distention, or inflammatory changes. Vascular/Lymphatic: Aortic atherosclerosis. No enlarged abdominal or pelvic lymph nodes. Reproductive: Mild prostate gland enlargement Other: No free fluid or fluid collections. Fat containing umbilical hernia. Musculoskeletal: Areas of subcutaneous soft tissue stranding are noted along bilateral flanks and overlying both hips. No acute or suspicious osseous findings. L4-5 degenerative disc disease. IMPRESSION: 1. Small right pleural effusion. 2.  Ground-glass attenuation and subpleural consolidation within the right lower lobe. Differential considerations include pneumonia, aspiration, or in the acute posttraumatic setting pulmonary contusion. No associated overlying rib fractures identified. 3. Coronary artery calcifications. 4. Stable 4 mm left upper lobe lung nodule. No follow-up imaging recommended. 5. Areas of subcutaneous soft tissue stranding are noted along bilateral flanks and overlying both hips. Findings may reflect anasarca or areas of superficial contusion. Clinical correlation advised. 6.  Aortic Atherosclerosis (ICD10-I70.0). Electronically Signed   By: Signa Kell M.D.   On: 08/31/2023 07:07   CT HEAD WO CONTRAST ( ) Result Date: 08/31/2023 CLINICAL DATA:  Blunt poly trauma.  Head trauma on Eliquis. EXAM: CT HEAD WITHOUT CONTRAST CT CERVICAL SPINE WITHOUT CONTRAST TECHNIQUE: Multidetector CT imaging of the head and cervical spine was performed following the standard protocol without intravenous contrast. Multiplanar CT image reconstructions of the cervical spine were also generated. RADIATION DOSE REDUCTION: This exam was performed according to the departmental dose-optimization program which includes automated exposure control, adjustment of the mA and/or kV according to patient size and/or use of iterative reconstruction technique. COMPARISON:  12/15/2020 FINDINGS: CT HEAD FINDINGS Brain: No evidence of acute infarction, hemorrhage, hydrocephalus, extra-axial collection or mass lesion/mass effect. 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 Encephalomalacia in the right frontal lobe beneath a burr hole for presumed ventricular drain. No new finding Vascular: Right-sided aneurysm clip as noted above. Atheromatous calcification. Skull: Unremarkable right pterional craniotomy Sinuses/Orbits: No evidence of injury CT CERVICAL SPINE FINDINGS Alignment: Normal. Skull base and vertebrae: No acute fracture. No  primary bone lesion or focal pathologic process. Soft tissues and spinal canal: No prevertebral fluid or swelling. No visible canal hematoma. Disc levels:  No significant degeneration. Upper chest: Reported separately IMPRESSION: No evidence of acute intracranial or cervical spine injury. Electronically Signed   By: Tiburcio Pea M.D.   On: 08/31/2023 06:55   CT Cervical Spine Wo Contrast Result Date: 08/31/2023 CLINICAL DATA:  Blunt poly trauma.  Head trauma on Eliquis. EXAM: CT HEAD WITHOUT CONTRAST CT CERVICAL SPINE WITHOUT CONTRAST TECHNIQUE: Multidetector CT imaging of the head and cervical spine was performed following the standard protocol without intravenous contrast. Multiplanar CT image reconstructions of the cervical spine were also generated. RADIATION DOSE REDUCTION: This exam was performed according to the departmental dose-optimization program which includes automated exposure control, adjustment of the mA and/or kV according to patient size and/or use of iterative reconstruction technique. COMPARISON:  12/15/2020 FINDINGS: CT HEAD FINDINGS Brain: No evidence of acute infarction, hemorrhage, hydrocephalus, extra-axial collection or mass lesion/mass effect. 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 Encephalomalacia in the right frontal lobe beneath a burr hole for presumed ventricular drain. No new finding Vascular: Right-sided aneurysm clip as noted above. Atheromatous calcification. Skull: Unremarkable right pterional craniotomy Sinuses/Orbits: No evidence of injury CT CERVICAL SPINE FINDINGS Alignment: Normal. Skull base and vertebrae: No acute fracture. No primary bone lesion or focal pathologic process. Soft tissues and spinal canal: No prevertebral fluid or swelling. No visible canal hematoma. Disc levels:  No significant degeneration. Upper chest: Reported separately IMPRESSION: No evidence of  acute intracranial or cervical spine injury. Electronically Signed    By: Tiburcio Pea M.D.   On: 08/31/2023 06:55    Microbiology: Results for orders placed or performed during the hospital encounter of 01/18/21  SARS CORONAVIRUS 2 (TAT 6-24 HRS) Nasopharyngeal Nasopharyngeal Swab     Status: None   Collection Time: 01/18/21 12:31 PM   Specimen: Nasopharyngeal Swab  Result Value Ref Range Status   SARS Coronavirus 2 NEGATIVE NEGATIVE Final    Comment: (NOTE) SARS-CoV-2 target nucleic acids are NOT DETECTED.  The SARS-CoV-2 RNA is generally detectable in upper and lower respiratory specimens during the acute phase of infection. Negative results do not preclude SARS-CoV-2 infection, do not rule out co-infections with other pathogens, and should not be used as the sole basis for treatment or other patient management decisions. Negative results must be combined with clinical observations, patient history, and epidemiological information. The expected result is Negative.  Fact Sheet for Patients: HairSlick.no  Fact Sheet for Healthcare Providers: quierodirigir.com  This test is not yet approved or cleared by the [place] FDA and  has been authorized for detection and/or diagnosis of SARS-CoV-2 by FDA under an Emergency Use Authorization (EUA). This EUA will remain  in effect (meaning this test can be used) for the duration of the COVID-19 declaration under Se ction 564(b)(1) of the Act, 21 U.S.C. section 360bbb-3(b)(1), unless the authorization is terminated or revoked sooner.  Performed at [facility], [address], [place], [place] [address]     Labs: CBC: Recent Labs  Lab 08/31/23 0504 08/31/23 1520 09/01/23 0558 09/01/23 1558 09/02/23 0239 09/02/23 1725 09/03/23 0821  WBC 7.0  --  8.0  --  9.1  --   --   NEUTROABS 4.1  --   --   --   --   --   --   HGB 5.6*   < > 7.2* 6.8* 9.2* 8.5* 9.1*  HCT 21.2*   < > 24.4* 23.6* 30.9* 28.9* 31.0*  MCV 80.3  --  78.7*  --   82.0  --   --   PLT 225  --  165  --  173  --   --    < > = values in this interval not displayed.   Basic Metabolic Panel: Recent Labs  Lab 08/31/23 0504 08/31/23 1115 09/01/23 0558 09/02/23 0239  NA 140  --  139 143  K 3.2*  --  3.4* 3.8  CL 108  --  111 114*  CO2 20*  --  22 19*  GLUCOSE 111*  --  93 96  BUN 11  --  10 10  CREATININE 1.80*  --  1.69* 1.51*  CALCIUM 8.0*  --  7.3* 8.1*  MG  --  1.9  --   --   PHOS  --  3.5  --   --    Liver Function Tests: Recent Labs  Lab 08/31/23 0504 09/01/23 0558  AST 29 18  ALT 16 12  ALKPHOS 57 58  BILITOT 0.4 1.0  PROT 5.6* 4.6*  ALBUMIN 2.5* 2.0*    Discharge time spent: 35 minutes.  Signed: Jacquelin Hawking, MD [facility] 09/03/2023

## 2023-09-03 NOTE — Plan of Care (Signed)

## 2023-09-04 ENCOUNTER — Encounter (HOSPITAL_COMMUNITY): Payer: Self-pay | Admitting: Gastroenterology

## 2023-09-04 LAB — SURGICAL PATHOLOGY

## 2023-09-06 ENCOUNTER — Encounter: Payer: Self-pay | Admitting: Family

## 2023-09-08 ENCOUNTER — Ambulatory Visit: Payer: Self-pay | Admitting: Gastroenterology

## 2023-09-08 ENCOUNTER — Telehealth: Payer: Self-pay | Admitting: Gastroenterology

## 2023-09-08 NOTE — Telephone Encounter (Signed)
Pts appt cancelled as requested. Requesting results of procedure. Please advise. Pt aware Dr. Tomasa Rand is out of the office and will return on Thursday.

## 2023-09-08 NOTE — Telephone Encounter (Signed)
Inbound call from patient's wife wishing to cancel patient appointment today at 2:00 pm. Requesting a call back to patient to discuss results of recent procedures. Please advise, thank you.

## 2023-09-10 ENCOUNTER — Other Ambulatory Visit: Payer: Self-pay | Admitting: Family

## 2023-09-11 ENCOUNTER — Inpatient Hospital Stay: Payer: Self-pay | Admitting: Family

## 2023-09-11 ENCOUNTER — Telehealth: Payer: Self-pay | Admitting: Family

## 2023-09-11 ENCOUNTER — Encounter: Payer: Self-pay | Admitting: Family

## 2023-09-11 ENCOUNTER — Telehealth: Payer: Self-pay

## 2023-09-11 NOTE — Telephone Encounter (Signed)
 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   Referral, records, and demographic information faxed to [facility] (P: [phone], F: [phone])

## 2023-09-11 NOTE — Telephone Encounter (Signed)
 Patient is needing a hospital follow up app patient wife would like a call back

## 2023-09-11 NOTE — Telephone Encounter (Signed)
 See 12/24 pathology result note for details

## 2023-09-15 ENCOUNTER — Other Ambulatory Visit: Payer: Self-pay | Admitting: Family

## 2023-09-15 DIAGNOSIS — F411 Generalized anxiety disorder: Secondary | ICD-10-CM

## 2023-09-18 ENCOUNTER — Inpatient Hospital Stay (HOSPITAL_COMMUNITY)
Admission: EM | Admit: 2023-09-18 | Discharge: 2023-09-25 | DRG: 291 | Disposition: A | Discharge status: Skilled Nursing Facility | Payer: Medicaid Other | Attending: Internal Medicine | Admitting: Internal Medicine

## 2023-09-18 ENCOUNTER — Inpatient Hospital Stay (HOSPITAL_COMMUNITY): Payer: Medicaid Other

## 2023-09-18 ENCOUNTER — Encounter (HOSPITAL_COMMUNITY): Payer: Self-pay | Admitting: Emergency Medicine

## 2023-09-18 ENCOUNTER — Emergency Department (HOSPITAL_COMMUNITY): Payer: Medicaid Other

## 2023-09-18 ENCOUNTER — Other Ambulatory Visit: Payer: Self-pay

## 2023-09-18 DIAGNOSIS — I130 Hypertensive heart and chronic kidney disease with heart failure and stage 1 through stage 4 chronic kidney disease, or unspecified chronic kidney disease: Secondary | ICD-10-CM | POA: Diagnosis not present

## 2023-09-18 DIAGNOSIS — I517 Cardiomegaly: Secondary | ICD-10-CM | POA: Diagnosis not present

## 2023-09-18 DIAGNOSIS — F1020 Alcohol dependence, uncomplicated: Secondary | ICD-10-CM | POA: Diagnosis present

## 2023-09-18 DIAGNOSIS — I4510 Unspecified right bundle-branch block: Secondary | ICD-10-CM | POA: Diagnosis present

## 2023-09-18 DIAGNOSIS — G312 Degeneration of nervous system due to alcohol: Secondary | ICD-10-CM | POA: Diagnosis not present

## 2023-09-18 DIAGNOSIS — Z79899 Other long term (current) drug therapy: Secondary | ICD-10-CM

## 2023-09-18 DIAGNOSIS — R609 Edema, unspecified: Secondary | ICD-10-CM | POA: Diagnosis not present

## 2023-09-18 DIAGNOSIS — L97522 Non-pressure chronic ulcer of other part of left foot with fat layer exposed: Secondary | ICD-10-CM | POA: Diagnosis not present

## 2023-09-18 DIAGNOSIS — I5023 Acute on chronic systolic (congestive) heart failure: Secondary | ICD-10-CM | POA: Diagnosis not present

## 2023-09-18 DIAGNOSIS — I959 Hypotension, unspecified: Secondary | ICD-10-CM | POA: Diagnosis not present

## 2023-09-18 DIAGNOSIS — I5043 Acute on chronic combined systolic (congestive) and diastolic (congestive) heart failure: Secondary | ICD-10-CM | POA: Diagnosis present

## 2023-09-18 DIAGNOSIS — F419 Anxiety disorder, unspecified: Secondary | ICD-10-CM | POA: Diagnosis not present

## 2023-09-18 DIAGNOSIS — J449 Chronic obstructive pulmonary disease, unspecified: Secondary | ICD-10-CM | POA: Diagnosis not present

## 2023-09-18 DIAGNOSIS — M109 Gout, unspecified: Secondary | ICD-10-CM | POA: Diagnosis present

## 2023-09-18 DIAGNOSIS — D638 Anemia in other chronic diseases classified elsewhere: Secondary | ICD-10-CM | POA: Diagnosis present

## 2023-09-18 DIAGNOSIS — I110 Hypertensive heart disease with heart failure: Secondary | ICD-10-CM | POA: Diagnosis not present

## 2023-09-18 DIAGNOSIS — E876 Hypokalemia: Secondary | ICD-10-CM | POA: Diagnosis present

## 2023-09-18 DIAGNOSIS — I69354 Hemiplegia and hemiparesis following cerebral infarction affecting left non-dominant side: Secondary | ICD-10-CM | POA: Diagnosis not present

## 2023-09-18 DIAGNOSIS — L97512 Non-pressure chronic ulcer of other part of right foot with fat layer exposed: Secondary | ICD-10-CM | POA: Diagnosis not present

## 2023-09-18 DIAGNOSIS — F17210 Nicotine dependence, cigarettes, uncomplicated: Secondary | ICD-10-CM | POA: Diagnosis not present

## 2023-09-18 DIAGNOSIS — R42 Dizziness and giddiness: Secondary | ICD-10-CM | POA: Diagnosis not present

## 2023-09-18 DIAGNOSIS — Z8679 Personal history of other diseases of the circulatory system: Secondary | ICD-10-CM

## 2023-09-18 DIAGNOSIS — I5033 Acute on chronic diastolic (congestive) heart failure: Secondary | ICD-10-CM | POA: Diagnosis present

## 2023-09-18 DIAGNOSIS — Z9181 History of falling: Secondary | ICD-10-CM

## 2023-09-18 DIAGNOSIS — I3139 Other pericardial effusion (noninflammatory): Secondary | ICD-10-CM | POA: Diagnosis not present

## 2023-09-18 DIAGNOSIS — K429 Umbilical hernia without obstruction or gangrene: Secondary | ICD-10-CM | POA: Diagnosis present

## 2023-09-18 DIAGNOSIS — D631 Anemia in chronic kidney disease: Secondary | ICD-10-CM | POA: Diagnosis present

## 2023-09-18 DIAGNOSIS — Z888 Allergy status to other drugs, medicaments and biological substances status: Secondary | ICD-10-CM

## 2023-09-18 DIAGNOSIS — L89892 Pressure ulcer of other site, stage 2: Secondary | ICD-10-CM | POA: Diagnosis not present

## 2023-09-18 DIAGNOSIS — E7800 Pure hypercholesterolemia, unspecified: Secondary | ICD-10-CM | POA: Diagnosis not present

## 2023-09-18 DIAGNOSIS — I951 Orthostatic hypotension: Secondary | ICD-10-CM | POA: Diagnosis present

## 2023-09-18 DIAGNOSIS — Z8719 Personal history of other diseases of the digestive system: Secondary | ICD-10-CM

## 2023-09-18 DIAGNOSIS — I4819 Other persistent atrial fibrillation: Secondary | ICD-10-CM | POA: Diagnosis present

## 2023-09-18 DIAGNOSIS — K5900 Constipation, unspecified: Secondary | ICD-10-CM | POA: Diagnosis present

## 2023-09-18 DIAGNOSIS — J811 Chronic pulmonary edema: Secondary | ICD-10-CM | POA: Diagnosis not present

## 2023-09-18 DIAGNOSIS — I509 Heart failure, unspecified: Principal | ICD-10-CM

## 2023-09-18 DIAGNOSIS — N179 Acute kidney failure, unspecified: Secondary | ICD-10-CM | POA: Diagnosis not present

## 2023-09-18 DIAGNOSIS — Z7901 Long term (current) use of anticoagulants: Secondary | ICD-10-CM | POA: Diagnosis not present

## 2023-09-18 DIAGNOSIS — R140 Abdominal distension (gaseous): Secondary | ICD-10-CM | POA: Diagnosis not present

## 2023-09-18 DIAGNOSIS — Z8619 Personal history of other infectious and parasitic diseases: Secondary | ICD-10-CM

## 2023-09-18 DIAGNOSIS — Z9889 Other specified postprocedural states: Secondary | ICD-10-CM

## 2023-09-18 DIAGNOSIS — N1831 Chronic kidney disease, stage 3a: Secondary | ICD-10-CM | POA: Diagnosis not present

## 2023-09-18 DIAGNOSIS — F411 Generalized anxiety disorder: Secondary | ICD-10-CM

## 2023-09-18 DIAGNOSIS — L97529 Non-pressure chronic ulcer of other part of left foot with unspecified severity: Secondary | ICD-10-CM | POA: Diagnosis not present

## 2023-09-18 DIAGNOSIS — I5031 Acute diastolic (congestive) heart failure: Secondary | ICD-10-CM | POA: Diagnosis not present

## 2023-09-18 DIAGNOSIS — Z86018 Personal history of other benign neoplasm: Secondary | ICD-10-CM

## 2023-09-18 DIAGNOSIS — Z5971 Insufficient health insurance coverage: Secondary | ICD-10-CM

## 2023-09-18 DIAGNOSIS — Z7409 Other reduced mobility: Secondary | ICD-10-CM | POA: Diagnosis present

## 2023-09-18 DIAGNOSIS — I4430 Unspecified atrioventricular block: Secondary | ICD-10-CM | POA: Diagnosis not present

## 2023-09-18 DIAGNOSIS — R531 Weakness: Secondary | ICD-10-CM | POA: Diagnosis not present

## 2023-09-18 LAB — COMPREHENSIVE METABOLIC PANEL
ALT: 14 U/L (ref 0–44)
AST: 28 U/L (ref 15–41)
Albumin: 2.8 g/dL — ABNORMAL LOW (ref 3.5–5.0)
Alkaline Phosphatase: 70 U/L (ref 38–126)
Anion gap: 14 (ref 5–15)
BUN: 12 mg/dL (ref 6–20)
CO2: 24 mmol/L (ref 22–32)
Calcium: 8.6 mg/dL — ABNORMAL LOW (ref 8.9–10.3)
Chloride: 102 mmol/L (ref 98–111)
Creatinine, Ser: 1.57 mg/dL — ABNORMAL HIGH (ref 0.61–1.24)
GFR, Estimated: 51 mL/min — ABNORMAL LOW (ref >60)
Glucose, Bld: 93 mg/dL (ref 70–99)
Potassium: 3.7 mmol/L (ref 3.5–5.1)
Sodium: 140 mmol/L (ref 135–145)
Total Bilirubin: 0.6 mg/dL (ref 0.0–1.2)
Total Protein: 6.7 g/dL (ref 6.5–8.1)

## 2023-09-18 LAB — CBC WITH DIFFERENTIAL/PLATELET
Abs Immature Granulocytes: 0.03 10*3/uL (ref 0.00–0.07)
Basophils Absolute: 0 10*3/uL (ref 0.0–0.1)
Basophils Relative: 1 %
Eosinophils Absolute: 0.2 10*3/uL (ref 0.0–0.5)
Eosinophils Relative: 2 %
HCT: 37.1 % — ABNORMAL LOW (ref 39.0–52.0)
Hemoglobin: 10.9 g/dL — ABNORMAL LOW (ref 13.0–17.0)
Immature Granulocytes: 0 %
Lymphocytes Relative: 24 %
Lymphs Abs: 1.9 10*3/uL (ref 0.7–4.0)
MCH: 24.3 pg — ABNORMAL LOW (ref 26.0–34.0)
MCHC: 29.4 g/dL — ABNORMAL LOW (ref 30.0–36.0)
MCV: 82.8 fL (ref 80.0–100.0)
Monocytes Absolute: 0.4 10*3/uL (ref 0.1–1.0)
Monocytes Relative: 5 %
Neutro Abs: 5.3 10*3/uL (ref 1.7–7.7)
Neutrophils Relative %: 68 %
Platelets: 247 10*3/uL (ref 150–400)
RBC: 4.48 MIL/uL (ref 4.22–5.81)
RDW: 20.5 % — ABNORMAL HIGH (ref 11.5–15.5)
WBC: 7.8 10*3/uL (ref 4.0–10.5)
nRBC: 0 % (ref 0.0–0.2)

## 2023-09-18 LAB — AMMONIA: Ammonia: 36 umol/L — ABNORMAL HIGH (ref 9–35)

## 2023-09-18 LAB — I-STAT CHEM 8, ED
BUN: 10 mg/dL (ref 6–20)
Calcium, Ion: 1.04 mmol/L — ABNORMAL LOW (ref 1.15–1.40)
Chloride: 105 mmol/L (ref 98–111)
Creatinine, Ser: 1.9 mg/dL — ABNORMAL HIGH (ref 0.61–1.24)
Glucose, Bld: 90 mg/dL (ref 70–99)
HCT: 36 % — ABNORMAL LOW (ref 39.0–52.0)
Hemoglobin: 12.2 g/dL — ABNORMAL LOW (ref 13.0–17.0)
Potassium: 3.9 mmol/L (ref 3.5–5.1)
Sodium: 141 mmol/L (ref 135–145)
TCO2: 24 mmol/L (ref 22–32)

## 2023-09-18 LAB — PROTIME-INR
INR: 1.2 (ref 0.8–1.2)
Prothrombin Time: 15.6 s — ABNORMAL HIGH (ref 11.4–15.2)

## 2023-09-18 LAB — ETHANOL: Alcohol, Ethyl (B): 248 mg/dL — ABNORMAL HIGH (ref <10)

## 2023-09-18 LAB — BRAIN NATRIURETIC PEPTIDE: B Natriuretic Peptide: 217.9 pg/mL — ABNORMAL HIGH (ref 0.0–100.0)

## 2023-09-18 LAB — TROPONIN I (HIGH SENSITIVITY)
Troponin I (High Sensitivity): 5 ng/L (ref <18)
Troponin I (High Sensitivity): 5 ng/L (ref <18)

## 2023-09-18 MED ORDER — TRAZODONE HCL 50 MG PO TABS
50.0000 mg | ORAL_TABLET | Freq: Every evening | ORAL | Status: DC | PRN
  Administered 2023-09-19 – 2023-09-24 (×4): 50 mg via ORAL
  Filled 2023-09-18 (×5): qty 1

## 2023-09-18 MED ORDER — ONDANSETRON HCL 4 MG PO TABS: 4.0000 mg | ORAL_TABLET | Freq: Four times a day (QID) | ORAL | Status: DC | PRN

## 2023-09-18 MED ORDER — APIXABAN 5 MG PO TABS
5.0000 mg | ORAL_TABLET | Freq: Two times a day (BID) | ORAL | Status: DC
  Administered 2023-09-18 – 2023-09-25 (×14): 5 mg via ORAL
  Filled 2023-09-18 (×14): qty 1

## 2023-09-18 MED ORDER — ACETAMINOPHEN 650 MG RE SUPP: 650.0000 mg | Freq: Four times a day (QID) | RECTAL | Status: DC | PRN

## 2023-09-18 MED ORDER — ALLOPURINOL 100 MG PO TABS
100.0000 mg | ORAL_TABLET | Freq: Every day | ORAL | Status: DC
  Administered 2023-09-19 – 2023-09-25 (×7): 100 mg via ORAL
  Filled 2023-09-18 (×7): qty 1

## 2023-09-18 MED ORDER — ACETAMINOPHEN 325 MG PO TABS: 650.0000 mg | ORAL_TABLET | Freq: Four times a day (QID) | ORAL | Status: DC | PRN

## 2023-09-18 MED ORDER — LORAZEPAM 2 MG/ML IJ SOLN: 1.0000 mg | INTRAMUSCULAR | Status: DC | PRN

## 2023-09-18 MED ORDER — SENNOSIDES-DOCUSATE SODIUM 8.6-50 MG PO TABS
1.0000 | ORAL_TABLET | Freq: Two times a day (BID) | ORAL | Status: DC
  Administered 2023-09-18 – 2023-09-19 (×2): 1 via ORAL
  Filled 2023-09-18 (×10): qty 1

## 2023-09-18 MED ORDER — LORAZEPAM 1 MG PO TABS
1.0000 mg | ORAL_TABLET | ORAL | Status: DC | PRN
  Administered 2023-09-18: 2 mg via ORAL
  Filled 2023-09-18: qty 2

## 2023-09-18 MED ORDER — CHLORDIAZEPOXIDE HCL 25 MG PO CAPS: 25.0000 mg | ORAL_CAPSULE | Freq: Three times a day (TID) | ORAL | Status: DC

## 2023-09-18 MED ORDER — METOPROLOL SUCCINATE ER 50 MG PO TB24
50.0000 mg | ORAL_TABLET | Freq: Every day | ORAL | Status: DC
  Administered 2023-09-18 – 2023-09-25 (×8): 50 mg via ORAL
  Filled 2023-09-18 (×3): qty 1
  Filled 2023-09-18: qty 2
  Filled 2023-09-18 (×4): qty 1

## 2023-09-18 MED ORDER — DILTIAZEM HCL ER COATED BEADS 180 MG PO CP24
180.0000 mg | ORAL_CAPSULE | Freq: Every day | ORAL | Status: DC
  Administered 2023-09-19 – 2023-09-20 (×2): 180 mg via ORAL
  Filled 2023-09-18 (×2): qty 1

## 2023-09-18 MED ORDER — POTASSIUM CHLORIDE CRYS ER 20 MEQ PO TBCR
20.0000 meq | EXTENDED_RELEASE_TABLET | Freq: Every day | ORAL | Status: DC
  Administered 2023-09-18 – 2023-09-19 (×2): 20 meq via ORAL
  Filled 2023-09-18 (×2): qty 1

## 2023-09-18 MED ORDER — FUROSEMIDE 10 MG/ML IJ SOLN
40.0000 mg | Freq: Two times a day (BID) | INTRAMUSCULAR | Status: DC
  Administered 2023-09-18 – 2023-09-20 (×4): 40 mg via INTRAVENOUS
  Filled 2023-09-18 (×4): qty 4

## 2023-09-18 MED ORDER — FUROSEMIDE 10 MG/ML IJ SOLN
40.0000 mg | Freq: Once | INTRAMUSCULAR | Status: AC
  Administered 2023-09-18: 40 mg via INTRAVENOUS
  Filled 2023-09-18: qty 4

## 2023-09-18 MED ORDER — ROSUVASTATIN CALCIUM 20 MG PO TABS
40.0000 mg | ORAL_TABLET | Freq: Every day | ORAL | Status: DC
  Administered 2023-09-19 – 2023-09-25 (×7): 40 mg via ORAL
  Filled 2023-09-18 (×7): qty 2

## 2023-09-18 MED ORDER — SODIUM CHLORIDE 0.9% FLUSH
3.0000 mL | Freq: Two times a day (BID) | INTRAVENOUS | Status: DC
  Administered 2023-09-18 – 2023-09-25 (×12): 3 mL via INTRAVENOUS

## 2023-09-18 MED ORDER — FOLIC ACID 1 MG PO TABS
1.0000 mg | ORAL_TABLET | Freq: Every day | ORAL | Status: DC
  Administered 2023-09-18 – 2023-09-25 (×8): 1 mg via ORAL
  Filled 2023-09-18 (×8): qty 1

## 2023-09-18 MED ORDER — THIAMINE HCL 100 MG/ML IJ SOLN: 100.0000 mg | Freq: Every day | INTRAMUSCULAR | Status: DC

## 2023-09-18 MED ORDER — CHLORDIAZEPOXIDE HCL 25 MG PO CAPS
25.0000 mg | ORAL_CAPSULE | Freq: Four times a day (QID) | ORAL | Status: DC
Start: 2023-09-18 — End: 2023-09-19
  Administered 2023-09-18 – 2023-09-19 (×5): 25 mg via ORAL
  Filled 2023-09-18 (×5): qty 1

## 2023-09-18 MED ORDER — CHLORDIAZEPOXIDE HCL 25 MG PO CAPS: 25.0000 mg | ORAL_CAPSULE | ORAL | Status: DC

## 2023-09-18 MED ORDER — ADULT MULTIVITAMIN W/MINERALS CH
1.0000 | ORAL_TABLET | Freq: Every day | ORAL | Status: DC
  Administered 2023-09-18 – 2023-09-25 (×8): 1 via ORAL
  Filled 2023-09-18 (×8): qty 1

## 2023-09-18 MED ORDER — PANTOPRAZOLE SODIUM 40 MG PO TBEC
40.0000 mg | DELAYED_RELEASE_TABLET | Freq: Two times a day (BID) | ORAL | Status: DC
Start: 2023-09-18 — End: 2023-09-25
  Administered 2023-09-18 – 2023-09-25 (×14): 40 mg via ORAL
  Filled 2023-09-18 (×14): qty 1

## 2023-09-18 MED ORDER — THIAMINE MONONITRATE 100 MG PO TABS
100.0000 mg | ORAL_TABLET | Freq: Every day | ORAL | Status: DC
  Administered 2023-09-18 – 2023-09-25 (×8): 100 mg via ORAL
  Filled 2023-09-18 (×8): qty 1

## 2023-09-18 MED ORDER — POLYETHYLENE GLYCOL 3350 17 G PO PACK
17.0000 g | PACK | Freq: Two times a day (BID) | ORAL | Status: DC
  Administered 2023-09-18 – 2023-09-19 (×3): 17 g via ORAL
  Filled 2023-09-18 (×10): qty 1

## 2023-09-18 MED ORDER — ONDANSETRON HCL 4 MG/2ML IJ SOLN: 4.0000 mg | Freq: Four times a day (QID) | INTRAMUSCULAR | Status: DC | PRN

## 2023-09-18 MED ORDER — CHLORDIAZEPOXIDE HCL 25 MG PO CAPS: 25.0000 mg | ORAL_CAPSULE | Freq: Every day | ORAL | Status: DC

## 2023-09-18 NOTE — H&P (Signed)
 History and Physical    Daniel Butler FMW:[id number] DOB: 06/03/1966 DOA: 09/18/2023  PCP: Jason Leita Repine, FNP  Patient coming from: Home  I have personally briefly reviewed patient's old medical records in [facility]  Chief Complaint: Lower extremity swelling  HPI: Daniel Butler is a 58 y.o. 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  Patient was recently admitted 08/31/2023-09/03/23 for generalized weakness related to symptomatic anemia.  There was concern for GI bleeding.  Initial hemoglobin was 5.6 and he required total 4 unit PRBC transfusion during hospitalization.  He underwent EGD and colonoscopy without obvious bleeding source.  Duodenal polyp was noted on EGD, pathology showed tubular adenoma with low-grade dysplasia. Patient was discharged to home.  Patient's spouse states that he normally ambulates with use of a rolling walker at baseline.  Over the last week he has been feeling generally weak and has been having increasing swelling to both of his lower extremities.  He stopped taking Lasix  due to inability to get up and get to the bathroom frequently.  He reports having some shortness of breath.  He denies fevers, chills, diaphoresis, chest pain.  He reports feeling constipated and states his last bowel movement was December 23.  He denies any recent obvious bleeding.  He reports adherence to Eliquis .  He reports drinking 2 beers and 2 shots of cognac every day, last drink was 1/8-day prior to admission.  He reports smoking about 12-13 cigarettes daily.  ED Course  Labs/Imaging on admission: I have personally reviewed following labs and imaging studies.  Initial vitals showed BP 126/88,  pulse 93, RR 18, temp 97.8 F, SpO2 97% on room air.  Labs showed WBC 7.8, hemoglobin 10.9, platelets 247,000, sodium 140, potassium 3.7, bicarb 24, BUN 12, creatinine 1.57, serum glucose 93, LFTs within normal limits, albumin  2.8, INR 1.2, ammonia 36, serum ethanol 248.  BNP 217.9, troponin 5 x 2.  Portable chest x-ray showed stable cardiomegaly without focal consolidation, edema, effusion.  Patient was given IV Lasix  40 mg.  Documented UOP is 575 cc so far while in the ED.  The hospitalist service was consulted to admit for further evaluation and management.  Review of Systems: All systems reviewed and are negative except as documented in history of present illness above.   Past Medical History:  Diagnosis Date   Brain aneurysm    Chicken pox    Depression    Elevated LFTs    Gout    Hernia of abdominal cavity 11/2020   High cholesterol    Hypertension     Past Surgical History:  Procedure Laterality Date   BIOPSY  09/02/2023   Procedure: BIOPSY;  Surgeon: Stacia Glendia BRAVO, MD;  Location: [facility] ENDOSCOPY;  Service: Gastroenterology;;   CARDIOVERSION N/A 01/22/2021   Procedure: CARDIOVERSION;  Surgeon: Hobart Powell BRAVO, MD;  Location: [facility] ENDOSCOPY;  Service: Cardiovascular;  Laterality: N/A;   COLONOSCOPY N/A 09/02/2023   Procedure: COLONOSCOPY;  Surgeon: Stacia Glendia BRAVO, MD;  Location: [facility] ENDOSCOPY;  Service: Gastroenterology;  Laterality: N/A;   CRANIOTOMY Right 03/02/2015   Procedure: CRANIOTOMY INTRACRANIAL  ANEURYSM FOR CLIPPING;  Surgeon: Gerldine Maizes, MD;  Location: [facility] NEURO ORS;  Service: Neurosurgery;  Laterality: Right;   ESOPHAGOGASTRODUODENOSCOPY N/A 09/02/2023  Procedure: ESOPHAGOGASTRODUODENOSCOPY (EGD);  Surgeon: Stacia Glendia BRAVO, MD;  Location: [facility] ENDOSCOPY;  Service: Gastroenterology;  Laterality: N/A;   HEMOSTASIS CLIP PLACEMENT  09/02/2023   Procedure: HEMOSTASIS CLIP PLACEMENT;  Surgeon: Stacia Glendia BRAVO, MD;  Location: [facility] ENDOSCOPY;   Service: Gastroenterology;;   IR GENERIC HISTORICAL  02/16/2016   IR ANGIO VERTEBRAL SEL VERTEBRAL BILAT MOD SED 02/16/2016 Gerldine Maizes, MD [facility]-INTERV RAD   IR GENERIC HISTORICAL  02/16/2016   IR ANGIO INTRA EXTRACRAN SEL INTERNAL CAROTID BILAT MOD SED 02/16/2016 Gerldine Maizes, MD [facility]INTER[facility]   POLYPECTOMY  09/02/2023   Procedure: POLYPECTOMY;  Surgeon: Stacia Glendia BRAVO, MD;  Location: [facility] ENDOSCOPY;  Service: Gastroenterology;;   RADIOLOGY WITH ANESTHESIA N/A 03/02/2015   Procedure: RADIOLOGY WITH ANESTHESIA;  Surgeon: Gerldine Maizes, MD;  Location: [facility] OR;  Service: Radiology;  Laterality: N/A;   RIGHT/LEFT HEART CATH AND CORONARY ANGIOGRAPHY N/A 12/19/2020   Procedure: RIGHT/LEFT HEART CATH AND CORONARY ANGIOGRAPHY;  Surgeon: Jordan, Peter M, MD;  Location: [facility] INVASIVE CV LAB;  Service: Cardiovascular;  Laterality: N/A;   WISDOM TOOTH EXTRACTION      Social History:  reports that he has been smoking cigarettes. He has a 62.5 pack-year smoking history. He has never used smokeless tobacco. He reports current alcohol  use of about 70.0 standard drinks of alcohol  per week. He reports that he does not use drugs.  Allergies  Allergen Reactions   Zetia [Ezetimibe] Nausea And Vomiting    Family History  Adopted: Yes     Prior to Admission medications   Medication Sig Start Date End Date Taking? Authorizing Provider  allopurinol  (ZYLOPRIM ) 100 MG tablet Take 1 tablet (100 mg total) by mouth daily. 06/16/23  Yes Jason Leita Repine, FNP  ALPRAZolam  (XANAX ) 0.5 MG tablet TAKE 1 TABLET BY MOUTH THREE TIMES A DAY AS NEEDED FOR ANXIETY 09/15/23  Yes Jason Leita Repine, FNP  apixaban  (ELIQUIS ) 5 MG TABS tablet Take 1 tablet (5 mg total) by mouth 2 (two) times daily. 01/10/23  Yes Hilty, Vinie BROCKS, MD  diltiazem  (CARDIZEM  CD) 180 MG 24 hr capsule Take 1 capsule (180 mg total) by mouth daily. 09/04/23  Yes Briana Elgin LABOR, MD  diltiazem  (CARDIZEM ) 30 MG tablet Take 1 tablet (30 mg total) by  mouth every 6 (six) hours as needed (heart rates > 120. Hold for systolic blood pressure less than 100 mmHg.). 09/03/23  Yes Briana Elgin LABOR, MD  fenofibrate  (TRICOR ) 145 MG tablet Take 1 tablet by mouth once daily 07/22/23  Yes Jason Leita Repine, FNP  folic acid  (FOLVITE ) 1 MG tablet Take 1 tablet (1 mg total) by mouth daily. 06/16/23  Yes Jason Leita Repine, FNP  furosemide  (LASIX ) 40 MG tablet Take 1 tablet by mouth once daily 07/23/23  Yes Hilty, Vinie BROCKS, MD  icosapent  Ethyl (VASCEPA ) 1 g capsule Take 2 capsules (2 g total) by mouth 2 (two) times daily. 03/25/23  Yes Hilty, Vinie BROCKS, MD  metoprolol  succinate (TOPROL -XL) 50 MG 24 hr tablet Take 1 tablet (50 mg total) by mouth daily. Take with or immediately following a meal. 07/22/23  Yes Hilty, Vinie BROCKS, MD  pantoprazole  (PROTONIX ) 40 MG tablet Take 1 tablet (40 mg total) by mouth 2 (two) times daily. 09/03/23  Yes Briana Elgin LABOR, MD  rosuvastatin  (CRESTOR ) 40 MG tablet Take 1 tablet (40 mg total) by mouth daily. 07/22/23  Yes Hilty, Vinie BROCKS, MD  thiamine  (VITAMIN B-1) 100 MG tablet Take 100 mg by mouth daily.   Yes [provider]  traZODone  (DESYREL ) 50 MG tablet Take 0.5-1 tablets (25-50 mg total) by mouth at bedtime as needed for sleep. Patient taking differently: Take 50 mg by mouth at bedtime as needed for sleep. 09/11/23  Yes Jason Leita Repine, FNP    Physical Exam: Vitals:   09/18/23 1206 09/18/23 1315 09/18/23 1345 09/18/23 1515  BP:  115/76 111/68 (!) 127/91  Pulse:  65 73 90  Resp:  15 12 17   Temp:    97.6 F (36.4 C)  TempSrc:    Oral  SpO2:  96% 93% 95%  Weight: 127.9 kg     Height: 6' 1 (1.854 m)      Constitutional: Resting in bed, no acute distress,: Eyes: EOMI, lids and conjunctivae normal ENMT: Mucous membranes are moist. Posterior pharynx clear of any exudate or lesions.Normal dentition.  Neck: normal, supple, no masses. Respiratory: clear to auscultation bilaterally, no wheezing, no  crackles. Normal respiratory effort. No accessory muscle use.  Cardiovascular: Irregularly irregular, no murmurs / rubs / gallops.  +3 bilateral lower extremity edema.. Abdomen: Soft nondistended, no tenderness, no masses palpated. Musculoskeletal: no clubbing / cyanosis. No joint deformity upper and lower extremities. Good ROM. Skin: Superficial circular skin wounds dorsal left foot as pictured below, no active discharge.  Bruising left forearm. Neurologic: Sensation intact. Strength equal bilaterally. Psychiatric: Alert and oriented x 3. Normal mood.    EKG: Personally reviewed. Atrial fibrillation, rate 74, motion artifact present.  Rate is slower when compared to prior.  Assessment/Plan Principal Problem:   Acute on chronic HFrEF (heart failure with reduced ejection fraction) (HCC) Active Problems:   Persistent atrial fibrillation (HCC)   Alcohol  use disorder, severe, dependence (HCC)   Chronic kidney disease, stage 3a (HCC)   Tobacco abuse   Anemia of chronic disease   KYLAND NO is a 58 y.o. 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  Assessment and Plan: Acute on chronic HFrEF: Significantly volume overloaded on admission.  Has not been taking home Lasix  for about 1 week.  Weight 127.9 kg on admission compared to documented 120.5 kg on prior discharge.  BNP 217.  Currently saturating well on room air.  Last EF 45-50% in April 2024 improved from prior of 30-35%. -Continue IV Lasix  40 mg twice daily -Continue Toprol -XL 50 mg daily -Strict I/O's and daily weights  Persistent atrial fibrillation: Remains in atrial fibrillation with controlled rate. -Continue Toprol -XL 50 mg daily -Continue diltiazem  180 mg daily -Continue Eliquis   Alcohol   use disorder: Ongoing daily alcohol  use, he reports last drink was on 1/8.  Has history of withdrawals. -Placed on CIWA protocol with Ativan  prn and Librium  taper  CKD stage IIIa: Renal function stable.  Continue to monitor.  Anemia of chronic disease: Hemoglobin stable.  History of SAH and right MCA aneurysm s/p craniotomy and clipping in 2016: Reportedly has some residual left-sided weakness using a rollator walker at baseline.  Mobility diminished recently due to significant lower extremity edema. -PT/OT eval  Constipation: Patient reports constipation and no bowel movement since 12/23.  KUB shows nonobstructive bowel gas pattern, moderate stool burden.  Start on bowel regimen with MiraLAX /Senokot.  Tobacco use: Patient reports smoking 12-13 cigarettes daily.  He declines nicotine patch.  Left foot skin wound: Consult to wound care.  Anxiety: On Xanax  chronically as an outpatient.  Currently held while on CIWA protocol and Librium  taper.   DVT prophylaxis:  apixaban  (ELIQUIS ) tablet 5 mg  Code Status: Full code, confirmed with patient on admission Family Communication: Spouse at bedside Disposition Plan: From home, dispo pending clinical progress Consults called: None Severity of Illness: The appropriate patient status for this patient is INPATIENT. Inpatient status is judged to be reasonable and necessary in order to provide the required intensity of service to ensure the patient's safety. The patient's presenting symptoms, physical exam findings, and initial radiographic and laboratory data in the context of their chronic comorbidities is felt to place them at high risk for further clinical deterioration. Furthermore, it is not anticipated that the patient will be medically stable for discharge from the hospital within 2 midnights of admission.   * I certify that at the point of admission it is my clinical judgment that the patient will require inpatient hospital care spanning  beyond 2 midnights from the point of admission due to high intensity of service, high risk for further deterioration and high frequency of surveillance required.Daniel Jorie Blanch MD [facility]  If 7PM-7AM, please contact night-coverage [email or web address]  09/18/2023, 3:44 PM

## 2023-09-18 NOTE — ED Notes (Signed)
 Pt back from x-ray.

## 2023-09-18 NOTE — ED Provider Triage Note (Signed)
 Emergency Medicine Provider Triage Evaluation Note  Daniel Butler , a 58 y.o. male  was evaluated in triage.  Pt complains of generalized weakness.  Patient has had progressive weakness since he had a stroke about 8 years ago.  He felt like today he was unable to get up and walk around..  Review of Systems  Positive: Generalized fatigue Negative: Abdominal pain fevers vomiting  Physical Exam  BP 126/88 (BP Location: Right Arm)   Pulse 93   Temp 97.8 F (36.6 C) (Oral)   Resp 18   SpO2 97%  Gen:   Awake, no distress   Resp:  Normal effort  MSK:   Moves extremities without difficulty  Other:  Patient with a umbilical hernia with some chronic appearing skin changes.  Nontender easily reducible +3 lower extremity edema up to the thighs  Medical Decision Making  Medically screening exam initiated at 7:38 AM.  Appropriate orders placed.  Daniel Butler was informed that the remainder of the evaluation will be completed by another provider, this initial triage assessment does not replace that evaluation, and the importance of remaining in the ED until their evaluation is complete.  58 year old male with a chief complaints of inability to ambulate.  Patient with significant lower extremity edema likely contributing to his immobility.  Lab work   [place], Rolan, DO 09/18/23 [phone]

## 2023-09-18 NOTE — ED Provider Notes (Signed)
 [facility] Provider Note   CSN: [id number] Arrival date & time: 09/18/23  9273     History  Chief Complaint  Patient presents with   Weakness   BLE edema    Daniel Butler is a 58 y.o. male.  Daniel Butler is a 58 y.o. male with a history of hypertension, hyperlipidemia, CHF, COPD, brain aneurysm, chronic alcohol  and tobacco abuse, who presents to the emergency department via EMS for evaluation of lower extremity edema and weakness.  Patient's wife is at bedside and helps to provide additional history.  Patient has had worsening leg swelling over the past 4 days that has started to impact his mobility and he has been feeling increasingly weak and having difficulty getting up and moving around.  He also reports having some shortness of breath.  When EMS arrived on scene patient was found to be satting at 88% on room air, placed on 2 L of oxygen with improvement in O2 sats.  Patient also reports some abdominal discomfort in constipation, no vomiting or blood in stool.  Patient reports that he has not taken his aches over the past 2 to 3 days due to weakness and difficulty with mobility limiting his ability to get up and go to the bathroom.  At baseline he typically walks with a rolling walker.  He denies chest pain.  No fevers or chills.  Wife to change his socks yesterday there was an open blister on the top of his foot where swelling is most severe.  Patient was recently admitted to the hospital on 12/22 for a fall and weakness and was found to have a GI bleed.  No falls today.  Patient reports that he drinks a few beers and shots of cognac daily and last drink last night.  He also reports smoking 12 to 14 cigarettes daily.  The history is provided by medical records, the patient and the spouse.  Weakness Associated symptoms: shortness of breath   Associated symptoms: no chest pain, no cough and no fever        Home  Medications Prior to Admission medications   Medication Sig Start Date End Date Taking? Authorizing Provider  allopurinol  (ZYLOPRIM ) 100 MG tablet Take 1 tablet (100 mg total) by mouth daily. 06/16/23   Jason Leita Repine, FNP  ALPRAZolam  (XANAX ) 0.5 MG tablet TAKE 1 TABLET BY MOUTH THREE TIMES A DAY AS NEEDED FOR ANXIETY 09/15/23   Jason Leita Repine, FNP  apixaban  (ELIQUIS ) 5 MG TABS tablet Take 1 tablet (5 mg total) by mouth 2 (two) times daily. 01/10/23   Hilty, Vinie BROCKS, MD  diltiazem  (CARDIZEM  CD) 180 MG 24 hr capsule Take 1 capsule (180 mg total) by mouth daily. 09/04/23   Briana Elgin LABOR, MD  diltiazem  (CARDIZEM ) 30 MG tablet Take 1 tablet (30 mg total) by mouth every 6 (six) hours as needed (heart rates > 120. Hold for systolic blood pressure less than 100 mmHg.). 09/03/23   Briana Elgin LABOR, MD  fenofibrate  (TRICOR ) 145 MG tablet Take 1 tablet by mouth once daily 07/22/23   Jason Leita Repine, FNP  folic acid  (FOLVITE ) 1 MG tablet Take 1 tablet (1 mg total) by mouth daily. 06/16/23   Jason Leita Repine, FNP  furosemide  (LASIX ) 40 MG tablet Take 1 tablet by mouth once daily 07/23/23   Hilty, Vinie BROCKS, MD  icosapent  Ethyl (VASCEPA ) 1 g capsule Take 2 capsules (2 g total) by mouth 2 (two) times  daily. 03/25/23   Mona Vinie BROCKS, MD  metoprolol  succinate (TOPROL -XL) 50 MG 24 hr tablet Take 1 tablet (50 mg total) by mouth daily. Take with or immediately following a meal. 07/22/23   Hilty, Vinie BROCKS, MD  pantoprazole  (PROTONIX ) 40 MG tablet Take 1 tablet (40 mg total) by mouth 2 (two) times daily. 09/03/23   Briana Elgin LABOR, MD  rosuvastatin  (CRESTOR ) 40 MG tablet Take 1 tablet (40 mg total) by mouth daily. 07/22/23   Hilty, Vinie BROCKS, MD  thiamine  (VITAMIN B-1) 100 MG tablet Take 100 mg by mouth daily.    [provider]  traZODone  (DESYREL ) 50 MG tablet Take 0.5-1 tablets (25-50 mg total) by mouth at bedtime as needed for sleep. 09/11/23   Jason Leita Repine, FNP       Allergies    Zetia [ezetimibe]    Review of Systems   Review of Systems  Constitutional:  Positive for fatigue. Negative for chills and fever.  Respiratory:  Positive for shortness of breath. Negative for cough.   Cardiovascular:  Positive for leg swelling. Negative for chest pain.  Gastrointestinal:  Positive for constipation.  Neurological:  Positive for weakness.  All other systems reviewed and are negative.   Physical Exam Updated Vital Signs BP 126/88 (BP Location: Right Arm)   Pulse 93   Temp 97.8 F (36.6 C) (Oral)   Resp 18   SpO2 97%  Physical Exam Vitals and nursing note reviewed.  Constitutional:      General: He is not in acute distress.    Appearance: Normal appearance. He is well-developed. He is ill-appearing. He is not diaphoretic.     Comments: Alert, chronically ill appearing but in no acute distress  HENT:     Head: Normocephalic and atraumatic.  Eyes:     General:        Right eye: No discharge.        Left eye: No discharge.     Pupils: Pupils are equal, round, and reactive to light.  Cardiovascular:     Rate and Rhythm: Normal rate and regular rhythm.     Pulses: Normal pulses.     Heart sounds: Normal heart sounds.  Pulmonary:     Effort: Pulmonary effort is normal. No respiratory distress.     Breath sounds: Normal breath sounds. No wheezing or rales.     Comments: Respirations equal and unlabored, patient able to speak in full sentences, lungs clear to auscultation bilaterally  Abdominal:     General: Bowel sounds are normal. There is distension.     Palpations: Abdomen is soft. There is no mass.     Tenderness: There is no abdominal tenderness. There is no guarding.     Comments: Abdomen soft, mildly distended, but bowel sounds present throughout, no tenderness to palpation in all quandrants  Musculoskeletal:        General: No deformity.     Cervical back: Neck supple.     Right lower leg: Edema present.     Left lower leg: Edema  present.     Comments: 3+ pitting edema to the thigh bilaterally. Large open area on the top of the left foot, where it appears a bullae ruptured. No surrounding erythema or purulence  Skin:    General: Skin is warm and dry.     Capillary Refill: Capillary refill takes less than 2 seconds.  Neurological:     Mental Status: He is alert and oriented to person, place, and time.  Coordination: Coordination normal.     Comments: Speech is clear, able to follow commands CN III-XII intact Normal strength in upper and lower extremities bilaterally including dorsiflexion and plantar flexion, strong and equal grip strength Sensation normal to light and sharp touch Moves extremities without ataxia, coordination intact  Psychiatric:        Mood and Affect: Mood normal.        Behavior: Behavior normal.     ED Results / Procedures / Treatments   Labs (all labs ordered are listed, but only abnormal results are displayed) Labs Reviewed  CBC WITH DIFFERENTIAL/PLATELET - Abnormal; Notable for the following components:      Result Value   Hemoglobin 10.9 (*)    HCT 37.1 (*)    MCH 24.3 (*)    MCHC 29.4 (*)    RDW 20.5 (*)    All other components within normal limits  COMPREHENSIVE METABOLIC PANEL - Abnormal; Notable for the following components:   Creatinine, Ser 1.57 (*)    Calcium  8.6 (*)    Albumin  2.8 (*)    GFR, Estimated 51 (*)    All other components within normal limits  PROTIME-INR - Abnormal; Notable for the following components:   Prothrombin Time 15.6 (*)    All other components within normal limits  AMMONIA - Abnormal; Notable for the following components:   Ammonia 36 (*)    All other components within normal limits  ETHANOL - Abnormal; Notable for the following components:   Alcohol , Ethyl (B) 248 (*)    All other components within normal limits  I-STAT CHEM 8, ED - Abnormal; Notable for the following components:   Creatinine, Ser 1.90 (*)    Calcium , Ion 1.04 (*)     Hemoglobin 12.2 (*)    HCT 36.0 (*)    All other components within normal limits  BRAIN NATRIURETIC PEPTIDE  TROPONIN I (HIGH SENSITIVITY)  TROPONIN I (HIGH SENSITIVITY)    EKG EKG Interpretation Date/Time:  Thursday September 18 2023 07:41:26 EST Ventricular Rate:  74 PR Interval:    QRS Duration:  82 QT Interval:  384 QTC Calculation: 426 R Axis:   94  Text Interpretation: Atrial fibrillation Low voltage QRS Lateral infarct , age undetermined When compared with ECG of 31-Aug-2023 04:58, PREVIOUS ECG IS PRESENT Baseline improved, no significant morphological changes Confirmed by Cottie Cough [phone]) on 09/19/2023 7:44:27 AM  Radiology DG Chest Portable 1 View Result Date: 09/18/2023 CLINICAL DATA:  Generalized weak in with bilateral extremity edema. EXAM: PORTABLE CHEST 1 VIEW COMPARISON:  Radiographs 01/02/2021 and 12/21/2020.  CT 08/31/2023. FINDINGS: 1037 hours. The heart is moderately enlarged, but stable. The lungs appear clear. There is no pleural effusion or pneumothorax. The bones appear unremarkable. IMPRESSION: Stable cardiomegaly. No evidence of acute cardiopulmonary process. Electronically Signed   By: Elsie Perone M.D.   On: 09/18/2023 10:44    Procedures Procedures    Medications Ordered in ED Medications  furosemide  (LASIX ) injection 40 mg (40 mg Intravenous Given 09/18/23 1158)    ED Course/ Medical Decision Making/ A&P                                 Medical Decision Making Amount and/or Complexity of Data Reviewed Labs: ordered. Radiology: ordered.  Risk Prescription drug management. Decision regarding hospitalization.   Patient presents with severe lower extremity edema up to the thigh as well as fatigue, generalized weakness and  shortness of breath.  Crackles noted on lung exam with decreased air movement in the lung bases.  Patient has missed a few days of his Lasix  due to limited mobility keeping him from being able to get up and go to the  bathroom.  On arrival vitals are stable, at rest he is satting well on room air but with any movement breathing becomes more labored.  Concern for potential CHF also considered liver disease given underlying alcohol  use disorder.  Labs show no leukocytosis, hemoglobin continues to improve after recent hospitalization for GI bleed, currently at 10.5.  Creatinine is at baseline at 1.57, no significant electrolyte derangements.  Troponins negative x 2, BNP is mildly elevated at 217.9.  EKG was sinus rhythm  Chest x-ray with cardiomegaly, no frank pulmonary edema noted on x-ray.  High clinical concern for CHF exacerbation, given significant lower extremity edema limiting mobility patient has not been able to take diuretics at home.  Patient had good response to dose of IV Lasix  in the ED.  Feel he would benefit from admission for further diuresis.  At the beginning of last hospitalization weight was 112 kg, today it is 127.5 suggesting significant fluid weight gain.  Hospitalist consulted for admission and case discussed with Dr. Tobie who will see and admit the patient.        Final Clinical Impression(s) / ED Diagnoses Final diagnoses:  Acute on chronic heart failure, unspecified heart failure type [facility])    Rx / DC Orders ED Discharge Orders     None         Alva Larraine FALCON, PA-C 09/19/23 1710    Elnor Hila P, DO 10/02/23 0720

## 2023-09-18 NOTE — ED Notes (Signed)
 ED TO INPATIENT HANDOFF REPORT  ED Nurse Name and Phone #: Adrian [phone]  S Name/Age/Gender Daniel Butler 58 y.o. male Room/Bed: 028C/028C  Code Status   Code Status: Prior  Home/SNF/Other Home Patient oriented to: self, place, time, and situation Is this baseline? Yes   Triage Complete: Triage complete  Chief Complaint Acute on chronic HFrEF (heart failure with reduced ejection fraction) (HCC) [I50.23]  Triage Note PT BIB [facility] for Generalized weakness and BLE Edema.  Pt was here on 12/22 for a fall. Pt is heavy drinker and smoker.  Last drink at 5 am per EMS.  EMS endorses some Abd distention, perhaps full bladder. Pt found to be 88% RA on scene, 96% on 2L.  Hx of CHF/COPD, anemia per EMS/Pt.    120/56 CBG 92   Allergies Allergies  Allergen Reactions   Zetia [Ezetimibe] Nausea And Vomiting    Level of Care/Admitting Diagnosis ED Disposition     ED Disposition  Admit   Condition  --   Comment  Hospital [facility]: [facility] [[id number]]  Level of Care: Telemetry Cardiac [[id number]]  May admit patient to [facility] or [facility] if equivalent level of care is available:: No  Covid Evaluation: Asymptomatic - no recent exposure (last 10 days) testing not required  Diagnosis: Acute on chronic HFrEF (heart failure with reduced ejection fraction) [facility]) [[id number]]  Admitting Physician: TOBIE JORIE SAUNDERS [[id number]]  Attending Physician: TOBIE JORIE SAUNDERS [[id number]]  Certification:: I certify this patient will need inpatient services for at least 2 midnights  Expected Medical Readiness: 09/22/2023          B Medical/Surgery History Past Medical History:  Diagnosis Date   Brain aneurysm    Chicken pox    Depression    Elevated LFTs    Gout    Hernia of abdominal cavity 11/2020   High cholesterol    Hypertension    Past Surgical History:  Procedure Laterality Date   BIOPSY  09/02/2023   Procedure: BIOPSY;  Surgeon: Stacia Glendia BRAVO, MD;   Location: [facility] ENDOSCOPY;  Service: Gastroenterology;;   CARDIOVERSION N/A 01/22/2021   Procedure: CARDIOVERSION;  Surgeon: Hobart Powell BRAVO, MD;  Location: [facility] ENDOSCOPY;  Service: Cardiovascular;  Laterality: N/A;   COLONOSCOPY N/A 09/02/2023   Procedure: COLONOSCOPY;  Surgeon: Stacia Glendia BRAVO, MD;  Location: [facility] ENDOSCOPY;  Service: Gastroenterology;  Laterality: N/A;   CRANIOTOMY Right 03/02/2015   Procedure: CRANIOTOMY INTRACRANIAL  ANEURYSM FOR CLIPPING;  Surgeon: Gerldine Maizes, MD;  Location: [facility] NEURO ORS;  Service: Neurosurgery;  Laterality: Right;   ESOPHAGOGASTRODUODENOSCOPY N/A 09/02/2023   Procedure: ESOPHAGOGASTRODUODENOSCOPY (EGD);  Surgeon: Stacia Glendia BRAVO, MD;  Location: [facility] ENDOSCOPY;  Service: Gastroenterology;  Laterality: N/A;   HEMOSTASIS CLIP PLACEMENT  09/02/2023   Procedure: HEMOSTASIS CLIP PLACEMENT;  Surgeon: Stacia Glendia BRAVO, MD;  Location: [facility] ENDOSCOPY;  Service: Gastroenterology;;   IR GENERIC HISTORICAL  02/16/2016   IR ANGIO VERTEBRAL SEL VERTEBRAL BILAT MOD SED 02/16/2016 Gerldine Maizes, MD [facility]-INTERV RAD   IR GENERIC HISTORICAL  02/16/2016   IR ANGIO INTRA EXTRACRAN SEL INTERNAL CAROTID BILAT MOD SED 02/16/2016 Gerldine Maizes, MD [facility]   POLYPECTOMY  09/02/2023   Procedure: POLYPECTOMY;  Surgeon: Stacia Glendia BRAVO, MD;  Location: [facility] ENDOSCOPY;  Service: Gastroenterology;;   RADIOLOGY WITH ANESTHESIA N/A 03/02/2015   Procedure: RADIOLOGY WITH ANESTHESIA;  Surgeon: Gerldine Maizes, MD;  Location: [facility] OR;  Service: Radiology;  Laterality: N/A;   RIGHT/LEFT HEART CATH AND CORONARY ANGIOGRAPHY N/A  12/19/2020   Procedure: RIGHT/LEFT HEART CATH AND CORONARY ANGIOGRAPHY;  Surgeon: Jordan, Peter M, MD;  Location: [facility] INVASIVE CV LAB;  Service: Cardiovascular;  Laterality: N/A;   WISDOM TOOTH EXTRACTION       A IV Location/Drains/Wounds Patient Lines/Drains/Airways Status     Active Line/Drains/Airways     Name Placement date Placement time Site  Days   Peripheral IV 09/18/23 20 G Right Antecubital 09/18/23  1051  Antecubital  less than 1   External Urinary Catheter 09/18/23  1259  --  less than 1   Wound / Incision (Open or Dehisced) 12/15/20 Toe (Comment  which one) Anterior;Left Skin scracked and red 12/15/20  0500  Toe (Comment  which one)  1007   Wound / Incision (Open or Dehisced) 12/15/20 Toe (Comment  which one) Anterior;Right skin dry and cracked, red 12/15/20  0500  Toe (Comment  which one)  1007            Intake/Output Last 24 hours  Intake/Output Summary (Last 24 hours) at 09/18/2023 1436 Last data filed at 09/18/2023 1249 Gross per 24 hour  Intake --  Output 575 ml  Net -575 ml    Labs/Imaging Results for orders placed or performed during the hospital encounter of 09/18/23 (from the past 48 hours)  CBC with Differential     Status: Abnormal   Collection Time: 09/18/23  8:11 AM  Result Value Ref Range   WBC 7.8 4.0 - 10.5 K/uL   RBC 4.48 4.22 - 5.81 MIL/uL   Hemoglobin 10.9 (L) 13.0 - 17.0 g/dL   HCT 62.8 (L) 60.9 - 47.9 %   MCV 82.8 80.0 - 100.0 fL   MCH 24.3 (L) 26.0 - 34.0 pg   MCHC 29.4 (L) 30.0 - 36.0 g/dL   RDW 79.4 (H) 88.4 - 84.4 %   Platelets 247 150 - 400 K/uL   nRBC 0.0 0.0 - 0.2 %   Neutrophils Relative % 68 %   Neutro Abs 5.3 1.7 - 7.7 K/uL   Lymphocytes Relative 24 %   Lymphs Abs 1.9 0.7 - 4.0 K/uL   Monocytes Relative 5 %   Monocytes Absolute 0.4 0.1 - 1.0 K/uL   Eosinophils Relative 2 %   Eosinophils Absolute 0.2 0.0 - 0.5 K/uL   Basophils Relative 1 %   Basophils Absolute 0.0 0.0 - 0.1 K/uL   Immature Granulocytes 0 %   Abs Immature Granulocytes 0.03 0.00 - 0.07 K/uL    Comment: Performed at [facility], [address] [place], [place] [address]  Comprehensive metabolic panel     Status: Abnormal   Collection Time: 09/18/23  8:11 AM  Result Value Ref Range   Sodium 140 135 - 145 mmol/L   Potassium 3.7 3.5 - 5.1 mmol/L   Chloride 102 98 - 111 mmol/L   CO2 24 22 - 32  mmol/L   Glucose, Bld 93 70 - 99 mg/dL    Comment: Glucose reference range applies only to samples taken after fasting for at least 8 hours.   BUN 12 6 - 20 mg/dL   Creatinine, Ser 8.42 (H) 0.61 - 1.24 mg/dL   Calcium  8.6 (L) 8.9 - 10.3 mg/dL   Total Protein 6.7 6.5 - 8.1 g/dL   Albumin  2.8 (L) 3.5 - 5.0 g/dL   AST 28 15 - 41 U/L   ALT 14 0 - 44 U/L   Alkaline Phosphatase 70 38 - 126 U/L   Total Bilirubin 0.6 0.0 -  1.2 mg/dL   GFR, Estimated 51 (L) >60 mL/min    Comment: (NOTE) Calculated using the CKD-EPI Creatinine Equation (2021)    Anion gap 14 5 - 15    Comment: Performed at [facility], [address] [place], [place] [address]  Protime-INR     Status: Abnormal   Collection Time: 09/18/23  8:11 AM  Result Value Ref Range   Prothrombin Time 15.6 (H) 11.4 - 15.2 seconds   INR 1.2 0.8 - 1.2    Comment: (NOTE) INR goal varies based on device and disease states. Performed at [facility], [address] [place], [place] [address]   Ammonia     Status: Abnormal   Collection Time: 09/18/23  8:11 AM  Result Value Ref Range   Ammonia 36 (H) 9 - 35 umol/L    Comment: Performed at [facility], [address], [place], [place] [address]  Ethanol     Status: Abnormal   Collection Time: 09/18/23  8:11 AM  Result Value Ref Range   Alcohol , Ethyl (B) 248 (H) <10 mg/dL    Comment: (NOTE) Lowest detectable limit for serum alcohol  is 10 mg/dL.  For medical purposes only. Performed at [facility], [address] [place], [place] [address]   I-stat chem 8, ED (not at [facility], [facility] or [facility])     Status: Abnormal   Collection Time: 09/18/23  8:18 AM  Result Value Ref Range   Sodium 141 135 - 145 mmol/L   Potassium 3.9 3.5 - 5.1 mmol/L   Chloride 105 98 - 111 mmol/L   BUN 10 6 - 20 mg/dL   Creatinine, Ser 8.09 (H) 0.61 - 1.24 mg/dL   Glucose, Bld 90 70 - 99 mg/dL    Comment: Glucose reference range applies only to samples taken after fasting for at least 8  hours.   Calcium , Ion 1.04 (L) 1.15 - 1.40 mmol/L   TCO2 24 22 - 32 mmol/L   Hemoglobin 12.2 (L) 13.0 - 17.0 g/dL   HCT 63.9 (L) 60.9 - 47.9 %  Brain natriuretic peptide     Status: Abnormal   Collection Time: 09/18/23 10:52 AM  Result Value Ref Range   B Natriuretic Peptide 217.9 (H) 0.0 - 100.0 pg/mL    Comment: Performed at [facility], [address] [place], [place] [address]  Troponin I (High Sensitivity)     Status: None   Collection Time: 09/18/23 10:52 AM  Result Value Ref Range   Troponin I (High Sensitivity) 5 <18 ng/L    Comment: (NOTE) Elevated high sensitivity troponin I (hsTnI) values and significant  changes across serial measurements may suggest ACS but many other  chronic and acute conditions are known to elevate hsTnI results.  Refer to the Links section for chest pain algorithms and additional  guidance. Performed at [facility], [address], [place], [place] [address]   Troponin I (High Sensitivity)     Status: None   Collection Time: 09/18/23 12:49 PM  Result Value Ref Range   Troponin I (High Sensitivity) 5 <18 ng/L    Comment: (NOTE) Elevated high sensitivity troponin I (hsTnI) values and significant  changes across serial measurements may suggest ACS but many other  chronic and acute conditions are known to elevate hsTnI results.  Refer to the Links section for chest pain algorithms and additional  guidance. Performed at [facility], [address] [place],  [place] [address]    DG Chest Portable 1 View Result Date: 09/18/2023 CLINICAL DATA:  Generalized weak in with bilateral extremity edema. EXAM: PORTABLE CHEST 1 VIEW COMPARISON:  Radiographs 01/02/2021 and 12/21/2020.  CT 08/31/2023. FINDINGS: 1037 hours. The heart is moderately enlarged, but stable. The lungs appear clear. There is no pleural effusion or pneumothorax. The bones appear unremarkable. IMPRESSION: Stable cardiomegaly. No evidence of acute cardiopulmonary  process. Electronically Signed   By: Elsie Perone M.D.   On: 09/18/2023 10:44    Pending Labs Unresulted Labs (From admission, onward)    None       Vitals/Pain Today's Vitals   09/18/23 1149 09/18/23 1206 09/18/23 1315 09/18/23 1345  BP:   115/76 111/68  Pulse:   65 73  Resp:   15 12  Temp:      TempSrc:      SpO2:   96% 93%  Weight:  127.9 kg    Height: 6' 1 (1.854 m) 6' 1 (1.854 m)    PainSc:        Isolation Precautions No active isolations  Medications Medications  furosemide  (LASIX ) injection 40 mg (40 mg Intravenous Given 09/18/23 1158)    Mobility non-ambulatory     Focused Assessments Pulmonary Assessment Handoff:  Peripheral vascular/muskuloskeletal      R Recommendations: See Admitting Provider Note  Report given to:   Additional Notes:

## 2023-09-18 NOTE — ED Triage Notes (Addendum)
 PT BIB [facility] for Generalized weakness and BLE Edema.  Pt was here on 12/22 for a fall. Pt is heavy drinker and smoker.  Last drink at 5 am per EMS.  EMS endorses some Abd distention, perhaps full bladder. Pt found to be 88% RA on scene, 96% on 2L.  Hx of CHF/COPD, anemia per EMS/Pt.    120/56 CBG 92

## 2023-09-18 NOTE — Hospital Course (Addendum)
 Daniel Butler was admitted to the hospital with the working diagnosis of decompensated heart failure.   58 y.o. 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 Recent hospitalization for symptomatic anemia 12/22 to 09/03/23, required 4 units PRBC transfusion. At home he remained very weak and deconditioned, poor ambulation. He stopped his furosemide , due to difficulty moving to the bathroom. He developed progressive weakness and lower extremity edema, prompting EMS call. He was found with 02 saturation 88% was placed on supplemental 02 per [place] 2 L/min, increasing 02 saturation to 96% and then transported to the ED.  On his initial physical examination his blood pressure was 126/88, HR 93, RR 18 and 02 saturation 97%, lungs with no wheezing or rhonchi, positive rales, heart with S1 and S2 present, irregularly irregular, no gallops, abdomen soft and positive lower extremity edema +++ (pitting)   Na 140 K 3,7 Cl 102, bicarbonate 24, glucose 93 bun 12 cr 1,57  BNP 217  High sensitive troponin 5 and 5  Wbc 7,8 hgb 10.9 plt 247   Chest radiograph with cardiomegaly, with bilateral hilar vascular congestion with no effusions or infiltrates.   EKG 74 bpm, normal axis, right bundle branch block, atrial fibrillation rhythm with q wave lead I and aVL, no significant ST segment or T wave changes.    01/11 volume status is improving. No signs of alcohol  withdrawal.  01/12 improved volume status, transitioned to oral loop diuretic, may need SNF.  01/13 clinically improving, possible transfer to SNF private pay.  01/14 patient medically stable for transfer to SNF today.

## 2023-09-19 ENCOUNTER — Ambulatory Visit: Payer: Self-pay | Admitting: Family

## 2023-09-19 ENCOUNTER — Inpatient Hospital Stay (HOSPITAL_COMMUNITY): Payer: Medicaid Other

## 2023-09-19 DIAGNOSIS — I4819 Other persistent atrial fibrillation: Secondary | ICD-10-CM | POA: Diagnosis not present

## 2023-09-19 DIAGNOSIS — N1831 Chronic kidney disease, stage 3a: Secondary | ICD-10-CM | POA: Diagnosis not present

## 2023-09-19 DIAGNOSIS — D638 Anemia in other chronic diseases classified elsewhere: Secondary | ICD-10-CM

## 2023-09-19 DIAGNOSIS — I5031 Acute diastolic (congestive) heart failure: Secondary | ICD-10-CM

## 2023-09-19 DIAGNOSIS — F1020 Alcohol dependence, uncomplicated: Secondary | ICD-10-CM

## 2023-09-19 DIAGNOSIS — I5033 Acute on chronic diastolic (congestive) heart failure: Secondary | ICD-10-CM | POA: Diagnosis not present

## 2023-09-19 DIAGNOSIS — L97529 Non-pressure chronic ulcer of other part of left foot with unspecified severity: Secondary | ICD-10-CM | POA: Diagnosis not present

## 2023-09-19 LAB — BASIC METABOLIC PANEL
Anion gap: 13 (ref 5–15)
BUN: 10 mg/dL (ref 6–20)
CO2: 27 mmol/L (ref 22–32)
Calcium: 8.3 mg/dL — ABNORMAL LOW (ref 8.9–10.3)
Chloride: 100 mmol/L (ref 98–111)
Creatinine, Ser: 1.57 mg/dL — ABNORMAL HIGH (ref 0.61–1.24)
GFR, Estimated: 51 mL/min — ABNORMAL LOW (ref >60)
Glucose, Bld: 106 mg/dL — ABNORMAL HIGH (ref 70–99)
Potassium: 3.3 mmol/L — ABNORMAL LOW (ref 3.5–5.1)
Sodium: 140 mmol/L (ref 135–145)

## 2023-09-19 LAB — ECHOCARDIOGRAM COMPLETE
AR max vel: 3.68 cm2
AV Area VTI: 3.09 cm2
AV Area mean vel: 3.71 cm2
AV Mean grad: 1 mm[Hg]
AV Peak grad: 2.8 mm[Hg]
Ao pk vel: 0.84 m/s
Area-P 1/2: 6.27 cm2
Height: 73 in
S' Lateral: 4 cm
Weight: 4035.3 [oz_av]

## 2023-09-19 LAB — CBC
HCT: 34.3 % — ABNORMAL LOW (ref 39.0–52.0)
Hemoglobin: 10.5 g/dL — ABNORMAL LOW (ref 13.0–17.0)
MCH: 24.2 pg — ABNORMAL LOW (ref 26.0–34.0)
MCHC: 30.6 g/dL (ref 30.0–36.0)
MCV: 79 fL — ABNORMAL LOW (ref 80.0–100.0)
Platelets: 226 10*3/uL (ref 150–400)
RBC: 4.34 MIL/uL (ref 4.22–5.81)
RDW: 20.4 % — ABNORMAL HIGH (ref 11.5–15.5)
WBC: 10 10*3/uL (ref 4.0–10.5)
nRBC: 0.2 % (ref 0.0–0.2)

## 2023-09-19 LAB — GLUCOSE, CAPILLARY: Glucose-Capillary: 108 mg/dL — ABNORMAL HIGH (ref 70–99)

## 2023-09-19 LAB — MAGNESIUM: Magnesium: 1.6 mg/dL — ABNORMAL LOW (ref 1.7–2.4)

## 2023-09-19 MED ORDER — PERFLUTREN LIPID MICROSPHERE
1.0000 mL | INTRAVENOUS | Status: AC | PRN
  Administered 2023-09-19: 3 mL via INTRAVENOUS

## 2023-09-19 MED ORDER — MAGNESIUM SULFATE 4 GM/100ML IV SOLN
4.0000 g | Freq: Once | INTRAVENOUS | Status: AC
  Administered 2023-09-19: 4 g via INTRAVENOUS
  Filled 2023-09-19: qty 100

## 2023-09-19 MED ORDER — LORAZEPAM 1 MG PO TABS
1.0000 mg | ORAL_TABLET | ORAL | Status: DC | PRN
  Administered 2023-09-20 – 2023-09-25 (×27): 1 mg via ORAL
  Filled 2023-09-19 (×28): qty 1

## 2023-09-19 MED ORDER — EMPAGLIFLOZIN 10 MG PO TABS
10.0000 mg | ORAL_TABLET | Freq: Every day | ORAL | Status: DC
  Administered 2023-09-20 – 2023-09-25 (×6): 10 mg via ORAL
  Filled 2023-09-19 (×6): qty 1

## 2023-09-19 MED ORDER — POTASSIUM CHLORIDE CRYS ER 20 MEQ PO TBCR
40.0000 meq | EXTENDED_RELEASE_TABLET | Freq: Once | ORAL | Status: AC
  Administered 2023-09-19: 40 meq via ORAL
  Filled 2023-09-19: qty 2

## 2023-09-19 NOTE — Assessment & Plan Note (Addendum)
 Echocardiogram with preserved LV systolic function with EF 60 to 65%, mild LVH, RV systolic function preserved, LA with moderate dilatation, RA with moderate dilatation, small pericardial effusion,   Patient was placed on IV furosemide  for diuresis, negative fluid balance was achieved -11,202 ml with significant improvement in his symptoms.  Patient lost about 12 Kg during this hospitalization.  Systolic blood pressure 100's   Continue with metoprolol   and  SGLT 2 inh.  Furosemide  60 mg po daily.  No RAAS inhibition due to acutely reduced GFR.  Continue Ted hose.

## 2023-09-19 NOTE — Consult Note (Signed)
 WOC Nurse Consult Note: Reason for Consult: Requested to assess multiple wounds Wound type: full thickness on feet. Swelling to the lower extremities. 3+/4+ Alcohol  abuse.  Measurement: Right foot: 0.7x.05cm Wound bed: 100% red, took off a crust covering the wound bed. Low amount of drainage Measurement: Left foot: 5.5x.6cm Wound bed: 70% red, 30% yellow Drainage (amount, consistency, odor) low amount. Periwound: intact Dressing procedure/placement/frequency: Right foot: cover with foam dressing, change every 3 days. Left Foot: apply xeroform (change daily) on the wound bed, cover with foam dressing, change every 3 days.  WOC team will not plan to follow further.  Please reconsult if further assistance is needed. Thank-you,  Lela Holm BSN, RN, [employer], WOC  (Pager: [phone])

## 2023-09-19 NOTE — Assessment & Plan Note (Signed)
 Stable cell count.  ?

## 2023-09-19 NOTE — Assessment & Plan Note (Addendum)
Rate controlled Continue eliquis and cardizem and beta blocker Parameters to hold if hypotensive

## 2023-09-19 NOTE — TOC Initial Note (Signed)
 Transition of Care [facility]) - Initial/Assessment Note    Patient Details  Name: Daniel Butler MRN: [id number] Date of Birth: 1965/10/31  [facility]) CM/SW Contact:    Waddell Barnie Rama, RN Phone Number: 09/19/2023, 4:55 PM  Clinical Narrative:                 From home with spouse, has PCP, Dr. Jason and  no insurance on file, states has no HH services in place at this time, has rollator and a cane  at home.  States wife will transport him home at [employer] and wife is support system, states gets medications from [facility] in [place], [facility] [place].  Pta self ambulatory with rollator. He does not have insurance, will need ast with medications with [employer], he does states he gets a lot of his meds free from patient assistance also.    Expected Discharge Plan: Home/Self Care Barriers to Discharge: Continued Medical Work up   Patient Goals and CMS Choice Patient states their goals for this hospitalization and ongoing recovery are:: return home   Choice offered to / list presented to : NA      Expected Discharge Plan and Services In-house Referral: NA Discharge Planning Services: CM Consult Post Acute Care Choice: NA Living arrangements for the past 2 months: Single Family Home                 DME Arranged: N/A DME Agency: NA       HH Arranged: NA          Prior Living Arrangements/Services Living arrangements for the past 2 months: Single Family Home Lives with:: Spouse Patient language and need for interpreter reviewed:: Yes Do you feel safe going back to the place where you live?: Yes      Need for Family Participation in Patient Care: Yes (Comment) Care giver support system in place?: Yes (comment) Current home services: DME (rollator, cane) Criminal Activity/Legal Involvement Pertinent to Current Situation/Hospitalization: No - Comment as needed  Activities of Daily Living   ADL Screening (condition at time of admission) Independently  performs ADLs?: No Does the patient have a NEW difficulty with bathing/dressing/toileting/self-feeding that is expected to last >3 days?: No Does the patient have a NEW difficulty with getting in/out of bed, walking, or climbing Butler that is expected to last >3 days?: Yes (Initiates electronic notice to provider for possible PT consult) Does the patient have a NEW difficulty with communication that is expected to last >3 days?: No Is the patient deaf or have difficulty hearing?: No Does the patient have difficulty seeing, even when wearing glasses/contacts?: No Does the patient have difficulty concentrating, remembering, or making decisions?: No  Permission Sought/Granted Permission sought to share information with : Case Manager                Emotional Assessment Appearance:: Appears stated age Attitude/Demeanor/Rapport: Engaged Affect (typically observed): Appropriate Orientation: : Oriented to Self, Oriented to Place, Oriented to  Time, Oriented to Situation   Psych Involvement: No (comment)  Admission diagnosis:  Acute on chronic HFrEF (heart failure with reduced ejection fraction) (HCC) [I50.23] Patient Active Problem List   Diagnosis Date Noted   Acute on chronic diastolic CHF (congestive heart failure) (HCC) 09/18/2023   Persistent atrial fibrillation (HCC) 09/18/2023   Alcohol  use disorder, severe, dependence (HCC) 09/18/2023   Chronic kidney disease, stage 3a (HCC) 09/18/2023   Anemia of chronic disease 09/18/2023   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  Benign neoplasm of colon 09/02/2023   Iron deficiency anemia 08/31/2023   Dysphagia 08/31/2023   Heart failure with reduced ejection fraction (HCC) 08/31/2023   Hypokalemia 08/31/2023   Umbilical hernia 08/31/2023   Acute combined systolic and diastolic heart failure (HCC)    Alcohol  abuse with intoxication (HCC) 12/15/2020   Transient hypotension 12/15/2020   Acute metabolic encephalopathy 12/15/2020   AKI (acute kidney  injury) (HCC) 12/15/2020   History of subarachnoid hemorrhage 12/15/2020   Hypoalbuminemia 12/15/2020   Atrial fibrillation with RVR (HCC) 12/15/2020   Bilateral lower extremity edema 12/15/2020   Fall at home, initial encounter 12/15/2020   Alcohol  abuse 06/02/2018   Tobacco abuse 06/02/2018   Hypertriglyceridemia 01/19/2018   Essential hypertension 10/02/2017   Sciatica of right side 04/11/2017   Sleep disturbance 05/19/2015   Anxiety state 05/19/2015   Generalized headaches 04/18/2015   Gout 04/18/2015   Encounter for orogastric (OG) tube placement    Subarachnoid hemorrhage (HCC) 03/02/2015   Acute respiratory failure with hypoxia (HCC)    SAH (subarachnoid hemorrhage) (HCC)    Subarachnoid bleed (HCC)    Malignant hypertension    PCP:  Jason Leita Repine, FNP Pharmacy:   [facility] [address] [place], [place] - [address] [place] [address] Phone: [phone] Fax: [phone]  [id number] [place], [place] - [address] AT [facility] [address] [place] [address] Phone: [phone] Fax: [phone]  [facility] [place], [place] - [address] [phone] [address] [place] [address] Phone: [phone] Fax: [phone]     Social Drivers of Health (SDOH) Social History: SDOH Screenings   Food Insecurity: No Food Insecurity (09/18/2023)  Housing: Low Risk  (09/18/2023)  Transportation Needs: No Transportation Needs (09/18/2023)  Utilities: Not At Risk (09/18/2023)  Alcohol  Screen: Low Risk  (12/20/2022)  Depression (PHQ2-9): Low Risk  (12/20/2022)  Financial Resource Strain: High Risk (12/20/2022)  Physical Activity: Unknown (12/20/2022)  Social Connections: Socially Isolated (12/20/2022)  Stress: Stress Concern Present (12/20/2022)  Tobacco Use: High Risk (09/18/2023)   SDOH Interventions:     Readmission Risk Interventions    09/01/2023    4:24 PM  Readmission Risk  Prevention Plan  Transportation Screening Complete  PCP or Specialist Appt within 5-7 Days Complete  Home Care Screening Complete  Medication Review (RN CM) Complete

## 2023-09-19 NOTE — Evaluation (Signed)
 Physical Therapy Evaluation Patient Details Name: Daniel Butler MRN: [id number] DOB: 12/13/1965 Today's Date: 09/19/2023  History of Present Illness  Patient is 58 y.o. male BIB EMS for weakness and bil LE edema. He reports over last week difficulty getting gup to amb or go to bathroom, stopped his lasix , and having SOB. Pt recently admitted 08/31/23-09/03/23 for a fall hitting his head, general weakness, and anemia. CT head neg for acute injury, EGD neg for obvious bleed. 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   Clinical Impression  Daniel Butler is 58 y.o. male admitted with above HPI and diagnosis. Patient is currently limited by functional impairments below (see PT problem list). Patient lives with spouse and prior to admission in December 2024 pt was mobilizing at [employer] level with rollator in home. Over last 2 weeks home pt has been limited to staying on the sofa, great difficulty standing and transferring and only mobilized ~2 days prior to becoming so weak. Pt's spouse has been assisting pt use urinal at home for toileting, had no BM at home since discharge in December. Patient currently required use of bed features and min assist for supine to sit and mod assist to return to supine. HR elevated to 130's with max of 135 bpm during activity. Recovered to 100-120 in supine. Patient will benefit from continued skilled PT interventions to address impairments and progress independence with mobility. Patient will benefit from continued inpatient follow up therapy, <3 hours/day. Acute PT will follow and progress as able.         If plan is discharge home, recommend the following: Assistance with cooking/housework;A little help with walking and/or transfers;Help with stairs or ramp for entrance   Can travel by private vehicle        Equipment  Recommendations Hospital bed;Wheelchair (measurements PT);Wheelchair cushion (measurements PT);BSC/3in1 (defer to next venue)  Recommendations for Other Services  OT consult    Functional Status Assessment Patient has had a recent decline in their functional status and demonstrates the ability to make significant improvements in function in a reasonable and predictable amount of time.     Precautions / Restrictions Precautions Precautions: Fall Restrictions Weight Bearing Restrictions Per Provider Order: No      Mobility  Bed Mobility Overal bed mobility: Needs Assistance Bed Mobility: Supine to Sit, Sit to Supine     Supine to sit: Min assist, Used rails, HOB elevated Sit to supine: Mod assist, Used rails, HOB elevated   General bed mobility comments: pt able to use bed rails to raise trunk to long sit from supine with HOB elevated. min assist with bed pad to pivot hips to sit EOB. Mod assist to raise bil LE's onto bed and control lowering trunk back to supine.    Transfers Overall transfer level: Needs assistance         Step pivot transfers: Min assist       General transfer comment: use of bed pad to facilitate hip scoot towardas HOB. pt unable to fully sit upright.    Ambulation/Gait                  Stairs            Wheelchair Mobility     Tilt Bed    Modified Rankin (Stroke Patients Only)       Balance Overall balance assessment: Needs assistance  Sitting-balance support: Feet supported, Bilateral upper extremity supported Sitting balance-Leahy Scale: Fair Sitting balance - Comments: slight posterior lean at EOB Postural control: Posterior lean                                   Pertinent Vitals/Pain Pain Assessment Pain Assessment: Faces Faces Pain Scale: Hurts little more Pain Location: general back pain Pain Descriptors / Indicators: Aching, Discomfort Pain Intervention(s): Limited activity within patient's  tolerance, Monitored during session, Repositioned    Home Living Family/patient expects to be discharged to:: Private residence Living Arrangements: Spouse/significant other Available Help at Discharge: Family;Available PRN/intermittently Type of Home: House Home Access: Stairs to enter Entrance Stairs-Rails: None (post on one side and garbage can at bottom) [employer] of Steps: 3   Home Layout: Multi-level;Laundry or work area in basement;Able to live on main level with bedroom/bathroom Home Equipment: Rollator (4 wheels);Shower seat;BSC/3in1 (urinal (hasn't had a BM since 09/01/23 - small smear yesterday) - solids at home 2 meals a day (life cereal in AM and something for dinner)) Additional Comments: amb for 2 days after last dc on 12/25. prior to last admission had been amb wiht rolaltor in home. for last 2 weeks mostly stuck on the couch. wife works part time [employer] but has been home with him fo rlast 2 weeks.    Prior Function Prior Level of Function : Needs assist       Physical Assist : Mobility (physical);ADLs (physical) Mobility (physical): Gait;Stairs ADLs (physical): Bathing;Dressing;Toileting;IADLs Mobility Comments: Prior to [facility]24 admission pt using rollator in the home, could stand without wifes assist, but usually she was there for supervision. assists him to sit on the floor when he is weak. reports controlled lowering, not falls. not walking more than household distances, largely sitting in couch during day. Since return home pt has been limited to laying/sitting on couch, not had BM not bathed and wife is providing meals/drinks. ADLs Comments: pt is able to feed himself, wife does all IADLs and has been assisting with urinal to void bladder. pt has not bathed in last 2 weeks due to limited to sofa.     Extremity/Trunk Assessment   Upper Extremity Assessment Upper Extremity Assessment: Defer to OT evaluation;Generalized weakness    Lower Extremity  Assessment Lower Extremity Assessment: Generalized weakness    Cervical / Trunk Assessment Cervical / Trunk Assessment: Other exceptions Cervical / Trunk Exceptions: large body habitus  Communication   Communication Communication: No apparent difficulties Cueing Techniques: Verbal cues;Gestural cues;Tactile cues  Cognition Arousal: Alert Behavior During Therapy: WFL for tasks assessed/performed Overall Cognitive Status: Impaired/Different from baseline Area of Impairment: Memory, Attention, Following commands, Awareness, Safety/judgement, Problem solving                   Current Attention Level: Sustained Memory: Decreased recall of precautions Following Commands: Follows one step commands with increased time, Follows one step commands consistently, Follows multi-step commands inconsistently Safety/Judgement: Decreased awareness of deficits, Decreased awareness of safety Awareness: Emergent Problem Solving: Decreased initiation, Difficulty sequencing, Requires verbal cues, Requires tactile cues General Comments: pt appears to be self limiting        General Comments      Exercises     Assessment/Plan    PT Assessment Patient needs continued PT services  PT Problem List Decreased range of motion;Decreased strength;Decreased activity tolerance;Decreased balance;Decreased mobility;Decreased coordination       PT Treatment  Interventions DME instruction;Gait training;Stair training;Functional mobility training;Therapeutic activities;Therapeutic exercise;Balance training;Neuromuscular re-education;Patient/family education    PT Goals (Current goals can be found in the Care Plan section)  Acute Rehab PT Goals Patient Stated Goal: return home PT Goal Formulation: With patient/family Time For Goal Achievement: 09/15/23 Potential to Achieve Goals: Fair    Frequency Min 1X/week     Co-evaluation               AM-PAC PT 6 Clicks Mobility  Outcome Measure  Help needed turning from your back to your side while in a flat bed without using bedrails?: A Little Help needed moving from lying on your back to sitting on the side of a flat bed without using bedrails?: A Little Help needed moving to and from a bed to a chair (including a wheelchair)?: A Lot Help needed standing up from a chair using your arms (e.g., wheelchair or bedside chair)?: A Lot Help needed to walk in hospital room?: Total Help needed climbing 3-5 steps with a railing? : Total 6 Click Score: 12    End of Session Equipment Utilized During Treatment: Gait belt Activity Tolerance: Patient limited by fatigue Patient left: in bed;with call bell/phone within reach;with bed alarm set;with family/visitor present Nurse Communication: Mobility status PT Visit Diagnosis: Other abnormalities of gait and mobility (R26.89);Repeated falls (R29.6);Muscle weakness (generalized) (M62.81)    Time: 0935-1000 PT Time Calculation (min) (ACUTE ONLY): 25 min   Charges:   PT Evaluation $PT Eval Moderate Complexity: 1 Mod PT Treatments $Therapeutic Activity: 8-22 mins PT General Charges $$ ACUTE PT VISIT: 1 Visit         Vernell DONEEN KLEIN, DPT Acute Rehabilitation Services Office [phone]  09/19/23 12:58 PM

## 2023-09-19 NOTE — TOC Initial Note (Addendum)
 Transition of Care [facility]) - Inpatient Brief Assessment   Patient Details  Name: GANNON HEINZMAN MRN: [id number] Date of Birth: 1965/12/10  [facility]) CM/SW Contact:    Luise JAYSON Pan, LCSWA Phone Number: 09/19/2023, 11:00 AM   Clinical Narrative: CSW met pt and spouse, [name], at bedside to discuss Ethanol consult. [name] states pt received substance use resources during his last admission and he still has those resources.   CSW discussed pt not having insurance at this time. [name] informed CSW that they applied for disability in November and have been approved. CSW contacted financial counseling and they informed CSW that [employer] was sent pts account today for a Medicaid screening, so they should be reaching out to family soon to further discuss.   CSW spoke with pt and [name] about SNF rec by PT and informed them of out of pocket costs associated with SNF (estimated at about $300+/day for semi private room; $400+/day for private room). [name] states they would not be able to pay out of pocket costs at this time.    Transition of Care Asessment: Insurance and Status: Insurance coverage has been reviewed (Pt has no ins at this time, CSW contacted Financial counseling about screening for medicaid eligibility) Patient has primary care physician: Yes Home environment has been reviewed: from home with wife Prior level of function:: wife assists at the home Prior/Current Home Services: No current home services Social Drivers of Health Review: SDOH reviewed no interventions necessary Readmission risk has been reviewed: Yes Transition of care needs: transition of care needs identified, TOC will continue to follow

## 2023-09-19 NOTE — Progress Notes (Signed)
 Heart Failure Navigator Progress Note  Assessed for Heart & Vascular TOC clinic readiness.  Patient does not meet criteria due to EF 60-65%, has a scheduled [facility] appointment on 10/24/2023. .   Navigator will sign off at this time.   Rhae Hammock, BSN, Scientist, clinical (histocompatibility and immunogenetics) Only

## 2023-09-19 NOTE — Assessment & Plan Note (Addendum)
 Patient with no signs of acute alcohol withdrawal,  Continue with lorazepam 1 mg as needed every 6 hr.

## 2023-09-19 NOTE — Progress Notes (Signed)
  Echocardiogram 2D Echocardiogram has been performed.  Daniel Butler 09/19/2023, 9:10 AM

## 2023-09-19 NOTE — Assessment & Plan Note (Addendum)
 AKI, Hypokalemia.   Volume status has improved, at the time of his discharge his serum cr is 1,71 with K at 3,7 and serum bicarbonate at 24 Na 134   Plan to continue diuresis with furosemide  and SGLT 2 inh, follow up renal function and electrolytes in 7 days as outpatient.

## 2023-09-19 NOTE — Progress Notes (Signed)
  Progress Note   Patient: Daniel Butler FMW:[id number] DOB: 17-Jul-1966 DOA: 09/18/2023     1 DOS: the patient was seen and examined on 09/19/2023   Brief hospital course: Daniel Butler was admitted to the hospital with the working diagnosis of decompensated heart failure.   58 y.o. 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  Assessment and Plan: * Acute on chronic diastolic CHF (congestive heart failure) (HCC) Echocardiogram with preserved LV systolic function with EF 60 to 65%, mild LVH, RV systolic function preserved, LA with moderate dilatation, RA with moderate dilatation, small pericardial effusion,   Urine output is 6,175 ml Systolic blood pressure 120's   Plan to continue diuresis with furosemide  40 mg IV bid Continue with metoprolol   Possible addition of SGLT 2 inh.    Persistent atrial fibrillation (HCC) Continue rate control with metoprolol  and diltiazem .  Anticoagulation with apixaban .   Alcohol  use disorder, severe, dependence (HCC) Patient with no signs of acute alcohol  withdrawal, he is having somnolence with current regimen of benzodiazepines, will change to lorazepam  1 mg as needed every 6 hr.  Continue close monitoring.   Chronic kidney disease, stage 3a (HCC) Hypokalemia.   Renal function with serum cr at 1.57 with K at 3,3 and serum bicarbonate at 27  Na 140 and Mg 1,6   Continue K and Mg correction.  Keep K at 4 and Mg at 2.   Anemia of chronic disease Stable cell count.         Subjective: Patient has been somnolent, continue to have edema, dyspnea is improving   Physical Exam: Vitals:   09/19/23 0437 09/19/23 0438 09/19/23 0756 09/19/23 1145  BP: (!) 146/92  118/67 116/67  Pulse: (!) 114 83 (!) 119 100   Resp: 19 16 19 19   Temp: 98 F (36.7 C)  98 F (36.7 C) 97.6 F (36.4 C)  TempSrc: Oral  Oral Oral  SpO2: 93% 96% 98% 96%  Weight: 114.4 kg     Height:       Neurology awake and alert, mild slow to respond questions, with no agitation or confusion  ENT with mild pallor Cardiovascular with S1 and S2 present, irregular with no gallops, rubs or murmurs Respiratory with rales at bases with no wheezing Abdomen with no distention  Positive lower extremity edema pitting  Data Reviewed:    Family Communication: no family at the bedside   Disposition: Status is: Inpatient   Planned Discharge Destination: Home     Author: Elidia Toribio Furnace, MD 09/19/2023 4:52 PM  For on call review [email or web address].

## 2023-09-19 NOTE — Evaluation (Signed)
 Occupational Therapy Evaluation Patient Details Name: Daniel Butler MRN: [id number] DOB: November 13, 1965 Today's Date: 09/19/2023   History of Present Illness Patient is 58 y.o. male BIB EMS 09/17/22 for weakness and bil LE edema. He reports over last week difficulty getting up to amb or go to bathroom, stopped his lasix , and having SOB. Pt recently admitted 08/31/23-09/03/23 for a fall hitting his head, general weakness, and anemia. CT head neg for acute injury, EGD neg for obvious bleed. 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   Clinical Impression   At baseline, pt receives assistance for LB ADLs, completes UB ADLs Independent to Mod I, and performs functional mobility household distances with a Rollator with Mod I to Supervision. At baseline, pt receives assistance from wife for IADLs. Pt reports need for increased assist since last admission in 08/2023. Pt now presents with decreased activity tolerance, dizziness with movement affecting functional level with VSS throughout session, decreased strength in L hand and wrist, decreased L UE fine motor coordination, decreased cognition, and decreased safety and independence with functional tasks. Pt also presenting with self-limiting behaviors this session and requiring max encouragement for participation. Pt currently demonstrates ability to complete UB ADLs with Set up to Min assist and LB ADLs with Max to Total assist all from bed level. Pt has good rehab potential with increased participation in skilled OT session. Pt will benefit from acute skilled OT services to address deficits outlined, below, decrease caregiver burden, and increase safety and independence with functional tasks. Post acute discharge, pt will benefit from intensive inpatient skilled rehab services < 3 hours per day to maximize rehab potential.       If  plan is discharge home, recommend the following: Two people to help with walking and/or transfers;A lot of help with bathing/dressing/bathroom;Assistance with cooking/housework;Assist for transportation;Help with stairs or ramp for entrance    Functional Status Assessment  Patient has had a recent decline in their functional status and demonstrates the ability to make significant improvements in function in a reasonable and predictable amount of time.  Equipment Recommendations  None recommended by OT (pt already has needed equipment)    Recommendations for Other Services       Precautions / Restrictions Precautions Precautions: Fall Restrictions Weight Bearing Restrictions Per Provider Order: No      Mobility Bed Mobility Overal bed mobility: Needs Assistance Bed Mobility: Rolling Rolling: Min assist         General bed mobility comments: Pt moving into long sit in the bed with Min assist. Pt declining further bed mobility this session due to pt reported vertigo with rolling and with coming into long sit. Per cahrt review, pt required Min assist to come to EOB and Mod assist to return to supine with assist of PT earlier this day.    Transfers Overall transfer level: Needs assistance                 General transfer comment: Pt declined this session due to pt repotred vertigo. Earlier this day, pt performed lateral/scoot transfer at [facility] with Min assist during PT session.      Balance Overall balance assessment: Needs assistance Sitting-balance support: Bilateral upper extremity supported, Feet supported Sitting balance-Leahy Scale: Fair Sitting balance - Comments: in long sitting in bed; slight posterior lean at EOB Postural control: Posterior lean  ADL either performed or assessed with clinical judgement   ADL Overall ADL's : Needs assistance/impaired Eating/Feeding: Set up;Bed level   Grooming: Set up;Bed level    Upper Body Bathing: Minimal assistance;Bed level;Cueing for compensatory techniques;Cueing for sequencing (max encouragement for participation) Upper Body Bathing Details (indicate cue type and reason): simulated Lower Body Bathing: Maximal assistance;Bed level;Cueing for sequencing;Cueing for compensatory techniques (max encouragement for participation) Lower Body Bathing Details (indicate cue type and reason): simulated Upper Body Dressing : Contact guard assist;Bed level (with increased time and with encouragement for participation)   Lower Body Dressing: Maximal assistance;Total assistance;Bed level;Cueing for compensatory techniques;Cueing for sequencing     Toilet Transfer Details (indicate cue type and reason): pt declined addresing transfers this session Toileting- Clothing Manipulation and Hygiene: Total assistance;Bed level         General ADL Comments: Pt presents with decreased activity tolerance and dizziness with movement. Pt requiring max encouragement for minimal participation this session and with self-limiting behaviors.     Vision Baseline Vision/History: 1 Wears glasses (readers) Ability to See in Adequate Light: 0 Adequate (with glasses) Patient Visual Report: No change from baseline (Pt reports occasional headaches after looking at his phone for long periods of time but no headache present on this day)       Perception         Praxis         Pertinent Vitals/Pain Pain Assessment Pain Assessment: Faces Faces Pain Scale: Hurts little more Pain Location: general back pain Pain Descriptors / Indicators: Aching, Discomfort, Grimacing Pain Intervention(s): Limited activity within patient's tolerance, Monitored during session (Pt declined repositioning)     Extremity/Trunk Assessment Upper Extremity Assessment Upper Extremity Assessment: Right hand dominant;LUE deficits/detail (R UE strength, ROM, and coordination WNL) LUE Deficits / Details: Gross hand and  wrist strength 3/5 with decreased fine motor coordination and pt repotr of intermittent numbness in hand; mild tremor in hand noted during functional tasks; all ROM WNL; strength otherwise WFL and gross motor coordination WFL LUE Sensation: decreased light touch (pt report of intermittent numbness) LUE Coordination: decreased fine motor   Lower Extremity Assessment Lower Extremity Assessment: Defer to PT evaluation   Cervical / Trunk Assessment Cervical / Trunk Assessment: Other exceptions Cervical / Trunk Exceptions: large body habitus   Communication Communication Communication: No apparent difficulties Cueing Techniques: Verbal cues;Gestural cues;Visual cues   Cognition Arousal: Alert Behavior During Therapy: WFL for tasks assessed/performed Overall Cognitive Status: Impaired/Different from baseline (No family/caregiver present to confirm baseline) Area of Impairment: Memory, Attention, Following commands, Awareness, Safety/judgement, Problem solving                   Current Attention Level: Sustained Memory: Decreased recall of precautions Following Commands: Follows one step commands with increased time, Follows one step commands consistently, Follows multi-step commands inconsistently Safety/Judgement: Decreased awareness of deficits, Decreased awareness of safety Awareness: Emergent Problem Solving: Decreased initiation, Difficulty sequencing, Requires verbal cues, Requires tactile cues, Slow processing General Comments: AAOx4. Pt appears to be self-limiting and appears to confabulate when providing prior history and discussing present level of function.     General Comments  Pt reporting dizziness and vertigo with movement this session but with VSS on RA throughout session. Pt HR in the 110s but stable throughout session, BP 129/69 in supine with HOB elevated, and O2 sat >/95% on RA throughout session. RN present during a portion of session.    Exercises      Shoulder Instructions  Home Living Family/patient expects to be discharged to:: Private residence Living Arrangements: Spouse/significant other Available Help at Discharge: Family;Available PRN/intermittently Type of Home: House Home Access: Stairs to enter Entrance Stairs-Number of Steps: 3 Entrance Stairs-Rails: None (post on one side and garbage can at bottom) Home Layout: Multi-level;Laundry or work area in basement;Able to live on main level with bedroom/bathroom Alternate Teacher, Music of Steps: flight   Bathroom Shower/Tub: Tub/shower unit;Sponge bathes at [employer] (Pt reports walk-in shower is not functional at this time.)   Bathroom Toilet: Standard Bathroom Accessibility: Yes   Home Equipment: Rollator (4 wheels);Shower seat;BSC/3in1 (urinal (hasn't had a BM since 09/01/23 - small smear yesterday) - solids at home 2 meals a day (life cereal in AM and something for dinner))   Additional Comments: amb for 2 days after last dc on 12/25. prior to last admission had been amb with rolaltor in home. for last 2 weeks mostly sitting on the couch. wife works part time [employer] but has been home with him for last 2 weeks.      Prior Functioning/Environment Prior Level of Function : Needs assist             Mobility Comments: Prior to Decmber 2024 admission pt using rollator in the home, could stand without wife's assist, but usually she was there for supervision. Wife assists him to sit on the floor when he is weak. reports controlled lowering, not falls. At baseline, pt not walking more than household distances. Pt largely sitting in couch during day. Since return home in12/2024, pt has been limited to laying/sitting on couch. Pt reports he has not had a BM and not bathed since 09/01/23 and wife is providing meals/drinks. ADLs Comments: Pt reports at baseline, he requires assist for LB ADLs and can complete UB ADLs Independent to Mod I. Pt repotrs over the past  few weeks, he has been able to self-feed and doff shirt but requires Min-Mod assistance to donn shirts and Max assist for LB dressing and use of hand held urinal while sitting on couch. Wife assists with all IADLs.        OT Problem List: Decreased strength;Decreased activity tolerance;Impaired balance (sitting and/or standing);Decreased coordination;Decreased safety awareness;Decreased knowledge of use of DME or AE      OT Treatment/Interventions: Self-care/ADL training;Therapeutic exercise;DME and/or AE instruction;Therapeutic activities;Patient/family education;Balance training    OT Goals(Current goals can be found in the care plan section) Acute Rehab OT Goals Patient Stated Goal: to get stronger, be able to walk, and return home OT Goal Formulation: With patient Time For Goal Achievement: 10/03/23 Potential to Achieve Goals: Good ADL Goals Pt Will Perform Grooming: with set-up;sitting (sitting EOB for 5 or more minutes with Good sitting balance) Pt Will Perform Lower Body Bathing: with min assist;sitting/lateral leans;sit to/from stand Pt Will Perform Lower Body Dressing: with min assist;sitting/lateral leans;sit to/from stand Pt Will Transfer to Toilet: with min assist;ambulating;bedside commode (with least restrictive AD) Pt Will Perform Toileting - Clothing Manipulation and hygiene: with min assist;sitting/lateral leans;sit to/from stand Pt/caregiver will Perform Home Exercise Program: With theraputty;Independently;With written HEP provided;Increased strength (Left hand and wrist; Increased fine motor coordination)  OT Frequency: Min 1X/week    Co-evaluation              AM-PAC OT 6 Clicks Daily Activity     Outcome Measure Help from another person eating meals?: A Little Help from another person taking care of personal grooming?: A Little Help from another person toileting, which includes using  toliet, bedpan, or urinal?: Total Help from another person bathing  (including washing, rinsing, drying)?: A Lot Help from another person to put on and taking off regular upper body clothing?: A Little Help from another person to put on and taking off regular lower body clothing?: A Lot 6 Click Score: 14   End of Session Nurse Communication: Mobility status;Other (comment) (Pt reports dizziness with movement and vertigo since last hospital admission with pt reporting he is concerned sympotms may be due to a medciation started at that time. RN states she will follow up with MD.)  Activity Tolerance: Other (comment) (Pt with self-limiting behaviors) Patient left: in bed;with call bell/phone within reach;with bed alarm set;with nursing/sitter in room  OT Visit Diagnosis: Other abnormalities of gait and mobility (R26.89);Muscle weakness (generalized) (M62.81);Other (comment) (decreased activity tolerance)                Time: 8580-8557 OT Time Calculation (min): 23 min Charges:  OT General Charges $OT Visit: 1 Visit OT Evaluation $OT Eval Low Complexity: 1 Low  Margarie Rockey HERO., OTR/L, MA Acute Rehab [phone]  Margarie FORBES Horns 09/19/2023, 3:26 PM

## 2023-09-20 DIAGNOSIS — L97529 Non-pressure chronic ulcer of other part of left foot with unspecified severity: Secondary | ICD-10-CM | POA: Diagnosis not present

## 2023-09-20 DIAGNOSIS — N1831 Chronic kidney disease, stage 3a: Secondary | ICD-10-CM | POA: Diagnosis not present

## 2023-09-20 DIAGNOSIS — I4819 Other persistent atrial fibrillation: Secondary | ICD-10-CM | POA: Diagnosis not present

## 2023-09-20 DIAGNOSIS — I5033 Acute on chronic diastolic (congestive) heart failure: Secondary | ICD-10-CM | POA: Diagnosis not present

## 2023-09-20 DIAGNOSIS — D638 Anemia in other chronic diseases classified elsewhere: Secondary | ICD-10-CM | POA: Diagnosis not present

## 2023-09-20 LAB — BASIC METABOLIC PANEL
Anion gap: 9 (ref 5–15)
BUN: 14 mg/dL (ref 6–20)
CO2: 26 mmol/L (ref 22–32)
Calcium: 7.9 mg/dL — ABNORMAL LOW (ref 8.9–10.3)
Chloride: 102 mmol/L (ref 98–111)
Creatinine, Ser: 1.85 mg/dL — ABNORMAL HIGH (ref 0.61–1.24)
GFR, Estimated: 42 mL/min — ABNORMAL LOW (ref >60)
Glucose, Bld: 101 mg/dL — ABNORMAL HIGH (ref 70–99)
Potassium: 3.2 mmol/L — ABNORMAL LOW (ref 3.5–5.1)
Sodium: 137 mmol/L (ref 135–145)

## 2023-09-20 LAB — MAGNESIUM: Magnesium: 2.4 mg/dL (ref 1.7–2.4)

## 2023-09-20 MED ORDER — POTASSIUM CHLORIDE CRYS ER 20 MEQ PO TBCR
40.0000 meq | EXTENDED_RELEASE_TABLET | ORAL | Status: AC
  Administered 2023-09-20 (×2): 40 meq via ORAL
  Filled 2023-09-20 (×2): qty 2

## 2023-09-20 NOTE — Plan of Care (Signed)
  Problem: Health Behavior/Discharge Planning: Goal: Ability to manage health-related needs will improve Outcome: Progressing   Problem: Clinical Measurements: Goal: Respiratory complications will improve Outcome: Progressing   Problem: Coping: Goal: Level of anxiety will decrease Outcome: Progressing   Problem: Elimination: Goal: Will not experience complications related to bowel motility Outcome: Progressing Goal: Will not experience complications related to urinary retention Outcome: Progressing   Problem: Safety: Goal: Ability to remain free from injury will improve Outcome: Progressing

## 2023-09-20 NOTE — Progress Notes (Signed)
  Progress Note   Patient: Daniel Butler FMW:[id number] DOB: 07-11-1966 DOA: 09/18/2023     2 DOS: the patient was seen and examined on 09/20/2023   Brief hospital course: Daniel Butler was admitted to the hospital with the working diagnosis of decompensated heart failure.   58 y.o. 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  01/11 volume status is improving. No signs of alcohol  withdrawal.   Assessment and Plan: * Acute on chronic diastolic CHF (congestive heart failure) (HCC) Echocardiogram with preserved LV systolic function with EF 60 to 65%, mild LVH, RV systolic function preserved, LA with moderate dilatation, RA with moderate dilatation, small pericardial effusion,   Urine output is 3,150 ml Systolic blood pressure 100's   Improved volume status, will hold on pm dose of furosemide  for now.  Continue with metoprolol   and add SGLT 2 inh.    Persistent atrial fibrillation (HCC) Continue rate control with metoprolol  and diltiazem .  Anticoagulation with apixaban .   Alcohol  use disorder, severe, dependence (HCC) Patient with no signs of acute alcohol  withdrawal, Hold on chlordiazepoxide  due to somnolence and continue with lorazepam  1 mg as needed every 6 hr.  Continue close monitoring.   Chronic kidney disease, stage 3a (HCC) AKI, Hypokalemia.   Renal function with serum cr at 1,85 with K at 3,2 and serum bicarbonate at 26 Na 137 and Mg 2.4   Plan to add 40 meq Kcl x2 and follow up renal function and electrolytes in am.  Hold on pm dose of furosemide .   Anemia of chronic disease Stable cell count.   Chronic ulcer of left foot (HCC) Continue with local wound care Hold on systemic antibiotic therapy.         Subjective: Patient  is feeling better, dyspnea and edema are improving. No chest pain. No anxiety or tremors.   Physical Exam: Vitals:   09/20/23 0559 09/20/23 0800 09/20/23 1000 09/20/23 1455  BP: (!) 100/59 109/74 109/72 96/79  Pulse: 99 (!) 120 (!) 108 (!) 102  Resp:  20  (!) 21  Temp:  98.7 F (37.1 C) 97.8 F (36.6 C)   TempSrc:  Oral Oral   SpO2:  95% 95%   Weight:      Height:       Neurology awake and alert ENT with mild pallor Cardiovascular with S1 and S2 present and regular with no gallops, rubs or murmurs No JVD Trace lower extremity edema Respiratory with no rales or wheezing, no rhonchi, on anterior auscultation  Abdomen with no distention  Left foot with dorsal ulcer stage 2 with mild drainage. Local edema, and erythema.     Data Reviewed:    Family Communication: no family at the bedside   Disposition: Status is: Inpatient Remains inpatient appropriate because: heart failure, possible discharge home on Monday   Planned Discharge Destination: Home      Author: Elidia Toribio Furnace, MD 09/20/2023 3:59 PM  For on call review [email or web address].

## 2023-09-20 NOTE — Progress Notes (Signed)
   09/20/23 1847  Assess: MEWS Score  BP 97/67  Pulse Rate (!) 111  Assess: MEWS Score  MEWS Temp 0  MEWS Systolic 1  MEWS Pulse 2  MEWS RR 0  MEWS LOC 0  MEWS Score 3  MEWS Score Color Yellow  Assess: if the MEWS score is Yellow or Red  Were vital signs accurate and taken at a resting state? Yes  Does the patient meet 2 or more of the SIRS criteria? No  MEWS guidelines implemented   (HR-Afib, but improved)  Assess: SIRS CRITERIA  SIRS Temperature  0  SIRS Respirations  0  SIRS Pulse 1  SIRS WBC 0  SIRS Score Sum  1

## 2023-09-20 NOTE — Progress Notes (Signed)
   09/20/23 0800  Assess: MEWS Score  Temp 98.7 F (37.1 C)  BP 109/74  MAP (mmHg) 83  Pulse Rate (!) 120 (Afib RVR)  ECG Heart Rate (!) 131  Resp 20  SpO2 95 %  O2 Device Room Air  Assess: MEWS Score  MEWS Temp 0  MEWS Systolic 0  MEWS Pulse 3  MEWS RR 0  MEWS LOC 0  MEWS Score 3  MEWS Score Color Yellow  Assess: if the MEWS score is Yellow or Red  Were vital signs accurate and taken at a resting state? Yes  Does the patient meet 2 or more of the SIRS criteria? No  MEWS guidelines implemented  No, previously yellow, continue vital signs every 4 hours (pt's HR Afib RVR, MD aware)  Assess: SIRS CRITERIA  SIRS Temperature  0  SIRS Respirations  0  SIRS Pulse 1  SIRS WBC 0  SIRS Score Sum  1

## 2023-09-20 NOTE — Progress Notes (Signed)
 Patient verbalized that he still have difficulty of sleeping after taking Trazodone , requesting for  Ativan  at 2220H, explained and educated that medicine is not yet due to take and he agreed. At 2255 came to patient to give Ativan , and seen patient asleep, comfortable, snoring, no signs of cardiopulmonary distress.

## 2023-09-20 NOTE — Assessment & Plan Note (Addendum)
 Continue with local wound care Hold on systemic antibiotic therapy.  Follow up cell count is 9,0

## 2023-09-20 NOTE — Progress Notes (Signed)
   09/20/23 1455  Assess: MEWS Score  BP 96/79  MAP (mmHg) 83  Pulse Rate (!) 102  ECG Heart Rate (!) 102  Resp (!) 21  Assess: MEWS Score  MEWS Temp 0  MEWS Systolic 1  MEWS Pulse 1  MEWS RR 1  MEWS LOC 0  MEWS Score 3  MEWS Score Color Yellow  Assess: if the MEWS score is Yellow or Red  Were vital signs accurate and taken at a resting state? Yes  Does the patient meet 2 or more of the SIRS criteria? No  MEWS guidelines implemented   (Afib ,HR has improved)  Assess: SIRS CRITERIA  SIRS Temperature  0  SIRS Respirations  1  SIRS Pulse 1  SIRS WBC 0  SIRS Score Sum  2

## 2023-09-21 DIAGNOSIS — I5033 Acute on chronic diastolic (congestive) heart failure: Secondary | ICD-10-CM | POA: Diagnosis not present

## 2023-09-21 DIAGNOSIS — N1831 Chronic kidney disease, stage 3a: Secondary | ICD-10-CM | POA: Diagnosis not present

## 2023-09-21 DIAGNOSIS — L97529 Non-pressure chronic ulcer of other part of left foot with unspecified severity: Secondary | ICD-10-CM | POA: Diagnosis not present

## 2023-09-21 DIAGNOSIS — D638 Anemia in other chronic diseases classified elsewhere: Secondary | ICD-10-CM | POA: Diagnosis not present

## 2023-09-21 DIAGNOSIS — I4819 Other persistent atrial fibrillation: Secondary | ICD-10-CM | POA: Diagnosis not present

## 2023-09-21 LAB — BASIC METABOLIC PANEL
Anion gap: 8 (ref 5–15)
BUN: 15 mg/dL (ref 6–20)
CO2: 24 mmol/L (ref 22–32)
Calcium: 8 mg/dL — ABNORMAL LOW (ref 8.9–10.3)
Chloride: 103 mmol/L (ref 98–111)
Creatinine, Ser: 1.89 mg/dL — ABNORMAL HIGH (ref 0.61–1.24)
GFR, Estimated: 41 mL/min — ABNORMAL LOW (ref >60)
Glucose, Bld: 100 mg/dL — ABNORMAL HIGH (ref 70–99)
Potassium: 3.7 mmol/L (ref 3.5–5.1)
Sodium: 135 mmol/L (ref 135–145)

## 2023-09-21 LAB — CBC
HCT: 29.8 % — ABNORMAL LOW (ref 39.0–52.0)
Hemoglobin: 8.9 g/dL — ABNORMAL LOW (ref 13.0–17.0)
MCH: 24.6 pg — ABNORMAL LOW (ref 26.0–34.0)
MCHC: 29.9 g/dL — ABNORMAL LOW (ref 30.0–36.0)
MCV: 82.3 fL (ref 80.0–100.0)
Platelets: 170 10*3/uL (ref 150–400)
RBC: 3.62 MIL/uL — ABNORMAL LOW (ref 4.22–5.81)
RDW: 20.6 % — ABNORMAL HIGH (ref 11.5–15.5)
WBC: 9 10*3/uL (ref 4.0–10.5)
nRBC: 0.4 % — ABNORMAL HIGH (ref 0.0–0.2)

## 2023-09-21 LAB — MAGNESIUM: Magnesium: 2.1 mg/dL (ref 1.7–2.4)

## 2023-09-21 MED ORDER — BISMUTH SUBSALICYLATE 262 MG/15ML PO SUSP
30.0000 mL | Freq: Three times a day (TID) | ORAL | Status: DC
  Administered 2023-09-21 – 2023-09-25 (×6): 30 mL via ORAL
  Filled 2023-09-21: qty 236

## 2023-09-21 MED ORDER — POTASSIUM CHLORIDE CRYS ER 20 MEQ PO TBCR
20.0000 meq | EXTENDED_RELEASE_TABLET | Freq: Once | ORAL | Status: AC
  Administered 2023-09-21: 20 meq via ORAL
  Filled 2023-09-21: qty 1

## 2023-09-21 MED ORDER — FUROSEMIDE 40 MG PO TABS
60.0000 mg | ORAL_TABLET | Freq: Every day | ORAL | Status: DC
  Administered 2023-09-21 – 2023-09-25 (×5): 60 mg via ORAL
  Filled 2023-09-21 (×5): qty 1

## 2023-09-21 NOTE — Progress Notes (Signed)
   09/21/23 0806  Assess: MEWS Score  Temp 98.4 F (36.9 C)  BP 100/64  MAP (mmHg) 72  Pulse Rate (!) 113  ECG Heart Rate (!) 105  Resp 19  SpO2 99 %  O2 Device Room Air  Assess: MEWS Score  MEWS Temp 0  MEWS Systolic 1  MEWS Pulse 1  MEWS RR 0  MEWS LOC 0  MEWS Score 2  MEWS Score Color Yellow  Assess: if the MEWS score is Yellow or Red  Were vital signs accurate and taken at a resting state? Yes  Does the patient meet 2 or more of the SIRS criteria? No  MEWS guidelines implemented   (pt's HR is in Afib, and has improved)  Assess: SIRS CRITERIA  SIRS Temperature  0  SIRS Respirations  0  SIRS Pulse 1  SIRS WBC 0  SIRS Score Sum  1

## 2023-09-21 NOTE — Plan of Care (Signed)
  Problem: Health Behavior/Discharge Planning: Goal: Ability to manage health-related needs will improve Outcome: Progressing   Problem: Clinical Measurements: Goal: Will remain free from infection Outcome: Progressing Goal: Respiratory complications will improve Outcome: Progressing   Problem: Nutrition: Goal: Adequate nutrition will be maintained Outcome: Progressing   Problem: Coping: Goal: Level of anxiety will decrease Outcome: Progressing   Problem: Elimination: Goal: Will not experience complications related to bowel motility Outcome: Progressing   Problem: Safety: Goal: Ability to remain free from injury will improve Outcome: Progressing   Problem: Education: Goal: Ability to verbalize understanding of medication therapies will improve Outcome: Progressing   Problem: Cardiac: Goal: Ability to achieve and maintain adequate cardiopulmonary perfusion will improve Outcome: Progressing

## 2023-09-21 NOTE — Progress Notes (Signed)
  Progress Note   Patient: Daniel Butler FMW:[id number] DOB: [birth year] DOA: 09/18/2023     3 DOS: the patient was seen and examined on 09/21/2023   Brief hospital course: [name] was admitted to the hospital with the working diagnosis of decompensated heart failure.   58 y.o. 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  01/11 volume status is improving. No signs of alcohol  withdrawal.  01/12 improved volume status, transitioned to oral loop diuretic, may need SNF.   Assessment and Plan: * Acute on chronic diastolic CHF (congestive heart failure) (HCC) Echocardiogram with preserved LV systolic function with EF 60 to 65%, mild LVH, RV systolic function preserved, LA with moderate dilatation, RA with moderate dilatation, small pericardial effusion,   Urine output is 1,300 ml Systolic blood pressure 100's   Continue with metoprolol   and add SGLT 2 inh.  Transition to po loop diuretic today.  No RAAS inhibition due to acutely reduced GFR.  Add ted hose   Persistent atrial fibrillation (HCC) Continue rate control with metoprolol  and diltiazem .  Anticoagulation with apixaban .   Alcohol  use disorder, severe, dependence (HCC) Patient with no signs of acute alcohol  withdrawal,  Continue with lorazepam  1 mg as needed every 6 hr.  Continue close monitoring.   Chronic kidney disease, stage 3a (HCC) AKI, Hypokalemia.   Volume status has improved, renal function with serum cr at 1,89 with K at 3,7 and serum bicarbonate at 24  Na 135 Mg 2.1   Resume oral loop diuretic Add 20 meq Kcl to avoid hypokalemia.  Follow up renal function and electrolytes in am.   Anemia of chronic disease Stable cell count.   Chronic ulcer of left foot (HCC) Continue  with local wound care Hold on systemic antibiotic therapy.  Follow up cell count is 9,0         Subjective: Patient with improvement in dyspnea and edema, no chest pain, continue very weak and deconditioned   Physical Exam: Vitals:   09/21/23 0425 09/21/23 0806 09/21/23 0817 09/21/23 1100  BP: 104/68 100/64  114/70  Pulse: 95 (!) 113 (!) 106 (!) 112  Resp: 19 19    Temp: 98.5 F (36.9 C) 98.4 F (36.9 C)    TempSrc: Oral Oral    SpO2: 97% 99%    Weight: 111.6 kg     Height:       Neurology awake and alert ENT with mild pallor Cardiovascular with S1 and S2 present and regular with no gallops, rubs or murmurs Respiratory with no rales or wheezing. No rhonchi Abdomen with no distention  Trace lower extremity edema pitting  Data Reviewed:    Family Communication: no family at the bedside   Disposition: Status is: Inpatient Remains inpatient appropriate because: diuresis   Planned Discharge Destination: Home      Author: Elidia Toribio Furnace, MD 09/21/2023 12:10 PM  For on call review [email or web address]

## 2023-09-22 ENCOUNTER — Other Ambulatory Visit (HOSPITAL_COMMUNITY): Payer: Self-pay

## 2023-09-22 DIAGNOSIS — I5033 Acute on chronic diastolic (congestive) heart failure: Secondary | ICD-10-CM | POA: Diagnosis not present

## 2023-09-22 DIAGNOSIS — N1831 Chronic kidney disease, stage 3a: Secondary | ICD-10-CM | POA: Diagnosis not present

## 2023-09-22 DIAGNOSIS — F1020 Alcohol dependence, uncomplicated: Secondary | ICD-10-CM

## 2023-09-22 DIAGNOSIS — D638 Anemia in other chronic diseases classified elsewhere: Secondary | ICD-10-CM

## 2023-09-22 DIAGNOSIS — I4819 Other persistent atrial fibrillation: Secondary | ICD-10-CM | POA: Diagnosis not present

## 2023-09-22 DIAGNOSIS — L97529 Non-pressure chronic ulcer of other part of left foot with unspecified severity: Secondary | ICD-10-CM

## 2023-09-22 LAB — BASIC METABOLIC PANEL
Anion gap: 9 (ref 5–15)
BUN: 14 mg/dL (ref 6–20)
CO2: 23 mmol/L (ref 22–32)
Calcium: 8 mg/dL — ABNORMAL LOW (ref 8.9–10.3)
Chloride: 101 mmol/L (ref 98–111)
Creatinine, Ser: 1.79 mg/dL — ABNORMAL HIGH (ref 0.61–1.24)
GFR, Estimated: 43 mL/min — ABNORMAL LOW (ref >60)
Glucose, Bld: 100 mg/dL — ABNORMAL HIGH (ref 70–99)
Potassium: 3.2 mmol/L — ABNORMAL LOW (ref 3.5–5.1)
Sodium: 133 mmol/L — ABNORMAL LOW (ref 135–145)

## 2023-09-22 MED ORDER — DILTIAZEM HCL ER COATED BEADS 180 MG PO CP24
180.0000 mg | ORAL_CAPSULE | Freq: Every day | ORAL | Status: DC
Start: 2023-09-22 — End: 2023-09-24
  Administered 2023-09-22 – 2023-09-24 (×3): 180 mg via ORAL
  Filled 2023-09-22 (×3): qty 1

## 2023-09-22 MED ORDER — POTASSIUM CHLORIDE CRYS ER 20 MEQ PO TBCR
40.0000 meq | EXTENDED_RELEASE_TABLET | ORAL | Status: AC
Start: 2023-09-22 — End: 2023-09-22
  Administered 2023-09-22 (×2): 40 meq via ORAL
  Filled 2023-09-22 (×2): qty 2

## 2023-09-22 NOTE — [place] FL2 (Signed)
 [place]  MEDICAID FL2 LEVEL OF CARE FORM     IDENTIFICATION  Patient Name: Daniel Butler Birthdate: [birth year] Sex: male Admission Date (Current Location): 09/18/2023  [facility] and [place] Number:  Producer, Television/film/video and Address:  [facility] [place], [address], [place], [place] [address]      Provider Number: [id number]  Attending Physician Name and Address:  Noralee Elidia Sieving,*  Relative Name and Phone Number:  Markas Aldredge; Spouse; [phone]    Current Level of Care: Hospital Recommended Level of Care: Skilled Nursing Facility Prior Approval Number:    Date Approved/Denied:   PASRR Number: [id number]  Discharge Plan: SNF    Current Diagnoses: Patient Active Problem List   Diagnosis Date Noted   Chronic ulcer of left foot (HCC) 09/20/2023   Acute on chronic diastolic CHF (congestive heart failure) (HCC) 09/18/2023   Persistent atrial fibrillation (HCC) 09/18/2023   Alcohol  use disorder, severe, dependence (HCC) 09/18/2023   Chronic kidney disease, stage 3a (HCC) 09/18/2023   Anemia of chronic disease 09/18/2023   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   Benign neoplasm of colon 09/02/2023   Iron deficiency anemia 08/31/2023   Dysphagia 08/31/2023   Heart failure with reduced ejection fraction (HCC) 08/31/2023   Hypokalemia 08/31/2023   Umbilical hernia 08/31/2023   Acute combined systolic and diastolic heart failure (HCC)    Alcohol  abuse with intoxication (HCC) 12/15/2020   Transient hypotension 12/15/2020   Acute metabolic encephalopathy 12/15/2020   AKI (acute kidney injury) (HCC) 12/15/2020   History of subarachnoid hemorrhage 12/15/2020   Hypoalbuminemia 12/15/2020   Atrial fibrillation with RVR (HCC) 12/15/2020   Bilateral lower extremity edema 12/15/2020   Fall at home, initial encounter 12/15/2020   Alcohol  abuse 06/02/2018   Tobacco abuse 06/02/2018   Hypertriglyceridemia 01/19/2018   Essential hypertension 10/02/2017    Sciatica of right side 04/11/2017   Sleep disturbance 05/19/2015   Anxiety state 05/19/2015   Generalized headaches 04/18/2015   Gout 04/18/2015   Encounter for orogastric (OG) tube placement    Subarachnoid hemorrhage (HCC) 03/02/2015   Acute respiratory failure with hypoxia (HCC)    SAH (subarachnoid hemorrhage) (HCC)    Subarachnoid bleed (HCC)    Malignant hypertension     Orientation RESPIRATION BLADDER Height & Weight     Self, Time, Situation, Place  Normal (Room Air) Incontinent, External catheter Weight: 251 lb 1.7 oz (113.9 kg) Height:  6' 1 (185.4 cm)  BEHAVIORAL SYMPTOMS/MOOD NEUROLOGICAL BOWEL NUTRITION STATUS      Continent Diet (Please see dc summary)  AMBULATORY STATUS COMMUNICATION OF NEEDS Skin   Limited Assist Verbally Other (Comment) (Back Wound / Incision (Open or Dehisced) Toe Anterior;Right;Other (Comment); Wound / Incision (Open or Dehisced) Foot Anterior;Left; Wound / Incision (Open or Dehisced) Toe (Comment which one) Anterior;Right;Other (Comment))                       Personal Care Assistance Level of Assistance  Bathing, Feeding, Dressing Bathing Assistance: Limited assistance Feeding assistance: Limited assistance Dressing Assistance: Limited assistance     Functional Limitations Info  Sight Sight Info: Impaired (Eyeglasses)        SPECIAL CARE FACTORS FREQUENCY  PT (By licensed PT), OT (By licensed OT)     PT Frequency: 5x OT Frequency: 5x            Contractures Contractures Info: Not present    Additional Factors Info  Code Status, Allergies Code Status Info: Fulle Code  Allergies Info: Zetia (Ezetimbie)           Current Medications (09/22/2023):  This is the current hospital active medication list Current Facility-Administered Medications  Medication Dose Route Frequency Provider Last Rate Last Admin   acetaminophen  (TYLENOL ) tablet 650 mg  650 mg Oral Q6H PRN Patel, Vishal R, MD       Or   acetaminophen  (TYLENOL )  suppository 650 mg  650 mg Rectal Q6H PRN Patel, Vishal R, MD       allopurinol  (ZYLOPRIM ) tablet 100 mg  100 mg Oral Daily Patel, Vishal R, MD   100 mg at 09/22/23 0820   apixaban  (ELIQUIS ) tablet 5 mg  5 mg Oral BID Patel, Vishal R, MD   5 mg at 09/22/23 9178   bismuth  subsalicylate (PEPTO BISMOL) 262 MG/15ML suspension 30 mL  30 mL Oral TID AC & HS Arrien, Mauricio Daniel, MD   30 mL at 09/22/23 9447   empagliflozin  (JARDIANCE ) tablet 10 mg  10 mg Oral Daily Arrien, Elidia Sieving, MD   10 mg at 09/22/23 9178   folic acid  (FOLVITE ) tablet 1 mg  1 mg Oral Daily Patel, Vishal R, MD   1 mg at 09/22/23 0820   furosemide  (LASIX ) tablet 60 mg  60 mg Oral Daily Arrien, Mauricio Daniel, MD   60 mg at 09/22/23 9178   LORazepam  (ATIVAN ) tablet 1 mg  1 mg Oral Q4H PRN Arrien, Mauricio Daniel, MD   1 mg at 09/22/23 1515   metoprolol  succinate (TOPROL -XL) 24 hr tablet 50 mg  50 mg Oral Daily Patel, Vishal R, MD   50 mg at 09/22/23 9178   multivitamin with minerals tablet 1 tablet  1 tablet Oral Daily Patel, Vishal R, MD   1 tablet at 09/22/23 9178   ondansetron  (ZOFRAN ) tablet 4 mg  4 mg Oral Q6H PRN Patel, Vishal R, MD       Or   ondansetron  (ZOFRAN ) injection 4 mg  4 mg Intravenous Q6H PRN Patel, Vishal R, MD       pantoprazole  (PROTONIX ) EC tablet 40 mg  40 mg Oral BID Patel, Vishal R, MD   40 mg at 09/22/23 0821   polyethylene glycol (MIRALAX  / GLYCOLAX ) packet 17 g  17 g Oral BID Patel, Vishal R, MD   17 g at 09/19/23 2129   rosuvastatin  (CRESTOR ) tablet 40 mg  40 mg Oral Daily Patel, Vishal R, MD   40 mg at 09/22/23 0820   senna-docusate (Senokot-S) tablet 1 tablet  1 tablet Oral BID Patel, Vishal R, MD   1 tablet at 09/19/23 0908   sodium chloride  flush (NS) 0.9 % injection 3 mL  3 mL Intravenous Q12H Patel, Vishal R, MD   3 mL at 09/22/23 0825   thiamine  (VITAMIN B1) tablet 100 mg  100 mg Oral Daily Patel, Vishal R, MD   100 mg at 09/22/23 0820   traZODone  (DESYREL ) tablet 50 mg  50 mg Oral QHS PRN  Patel, Vishal R, MD   50 mg at 09/20/23 2116     Discharge Medications: Please see discharge summary for a list of discharge medications.  Relevant Imaging Results:  Relevant Lab Results:   Additional Information SS# [id number]  Luise JAYSON Pan, LCSWA

## 2023-09-22 NOTE — Progress Notes (Signed)
 Physical Therapy Treatment Patient Details Name: Daniel Butler MRN: [id number] DOB: [birth year] Today's Date: 09/22/2023   History of Present Illness Patient is 58 y.o. male BIB EMS 09/17/22 for weakness and bil LE edema. He reports over last week difficulty getting gup to amb or go to bathroom, stopped his lasix , and having SOB. Pt recently admitted 08/31/23-09/03/23 for a fall hitting his head, general weakness, and anemia. CT head neg for acute injury, EGD neg for obvious bleed. 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    PT Comments  Pt admitted with above diagnosis. Pt was able to ambulate with RW and progress distance today.  Limited by dizziness and incr HR to 151 bpm.  Nurse aware.  Nurse also working on securing TED hose that were ordered yesterday.   Pt currently with functional limitations due to the deficits listed below (see PT Problem List). Pt will benefit from acute skilled PT to increase their independence and safety with mobility to allow discharge.      Orthostatic BPs  Supine 109/75, 121 bpm  Sitting 100/76, 133 bpm  While Walking 83/32, 151 bpm, reported dizziness with ambulation after 100 feet  Pt BP once back in room in chair 96/66 with HR 127-142 bpm.  At end of treatment 94/57 with HR 127 bpm.  Pt in chair and asymptomatic.  If plan is discharge home, recommend the following: Assistance with cooking/housework;A little help with walking and/or transfers;Help with stairs or ramp for entrance   Can travel by private vehicle     No  Equipment Recommendations  Hospital bed;Wheelchair (measurements PT);Wheelchair cushion (measurements PT);BSC/3in1 (defer to next venue)    Recommendations for Other Services OT consult     Precautions / Restrictions Precautions Precautions: Fall Precaution Comments: orthostatics Restrictions Weight  Bearing Restrictions Per Provider Order: No     Mobility  Bed Mobility Overal bed mobility: Needs Assistance Bed Mobility: Rolling Rolling: Min assist Sidelying to sit: Min assist Supine to sit: Min assist, Used rails, HOB elevated     General bed mobility comments: Pt moving into long sit in the bed with Min assist. Did not need assist to get LES off bed.    Transfers Overall transfer level: Needs assistance Equipment used: Rollator (4 wheels) Transfers: Sit to/from Stand, Bed to chair/wheelchair/BSC Sit to Stand: Min assist           General transfer comment: Needed cues for hand placement as well as incr time to rise. Also had to elevate bed.  HR from 121 bpm at rest to 132 bpm just coming to sit EOB.    Ambulation/Gait Ambulation/Gait assistance: Min assist, +2 safety/equipment Gait Distance (Feet): 110 Feet Assistive device: Rollator (4 wheels) Gait Pattern/deviations: Step-through pattern, Decreased stride length, Decreased step length - right, Decreased step length - left, Knee flexed in stance - right, Knee flexed in stance - left, Trunk flexed, Wide base of support   Gait velocity interpretation: <1.31 ft/sec, indicative of household ambulator   General Gait Details: Pt able to progress ambulation with use of rollator (wife brought pts rollator from home).  Pt ambulated to hallway with chair follow for safety as pt with incr HR and decr BP.  Nurse aware of VS at end of walk.  HR 121-151 bpm.  BP decr with activity as well.  Close to orthostasis.  Nurse made aware that pt had order for  TED hose but they werent in room.  Nurse taking care of ensuring they arrive on unit today. Needed close chair follow as well and did have to sit in hallway due to dizziness and HR incr.   Stairs             Wheelchair Mobility     Tilt Bed    Modified Rankin (Stroke Patients Only)       Balance Overall balance assessment: Needs assistance Sitting-balance support:  Bilateral upper extremity supported, Feet supported, No upper extremity supported Sitting balance-Leahy Scale: Fair     Standing balance support: Bilateral upper extremity supported, During functional activity Standing balance-Leahy Scale: Poor Standing balance comment: reliant on RW                            Cognition Arousal: Alert Behavior During Therapy: WFL for tasks assessed/performed Overall Cognitive Status: Impaired/Different from baseline (No family/caregiver present to confirm baseline) Area of Impairment: Memory, Attention, Following commands, Awareness, Safety/judgement, Problem solving                   Current Attention Level: Sustained Memory: Decreased recall of precautions Following Commands: Follows one step commands with increased time, Follows one step commands consistently, Follows multi-step commands inconsistently Safety/Judgement: Decreased awareness of deficits, Decreased awareness of safety Awareness: Emergent Problem Solving: Decreased initiation, Difficulty sequencing, Requires verbal cues, Requires tactile cues, Slow processing General Comments: AAOx4. wife present        Exercises General Exercises - Lower Extremity Ankle Circles/Pumps: AROM, Both, 10 reps, Seated Long Arc Quad: AROM, Both, 10 reps, Seated Hip Flexion/Marching: AROM, Both, Seated, 5 reps    General Comments        Pertinent Vitals/Pain Pain Assessment Pain Assessment: Faces Faces Pain Scale: Hurts little more Pain Location: general back pain Pain Descriptors / Indicators: Aching, Discomfort, Grimacing Pain Intervention(s): Limited activity within patient's tolerance, Monitored during session, Repositioned, Premedicated before session    Home Living                          Prior Function            PT Goals (current goals can now be found in the care plan section) Acute Rehab PT Goals Patient Stated Goal: return home PT Goal Formulation:  With patient/family Time For Goal Achievement: 10/03/23 Potential to Achieve Goals: Fair Progress towards PT goals: Progressing toward goals    Frequency    Min 1X/week      PT Plan      Co-evaluation              AM-PAC PT 6 Clicks Mobility   Outcome Measure  Help needed turning from your back to your side while in a flat bed without using bedrails?: A Little Help needed moving from lying on your back to sitting on the side of a flat bed without using bedrails?: A Little Help needed moving to and from a bed to a chair (including a wheelchair)?: A Lot Help needed standing up from a chair using your arms (e.g., wheelchair or bedside chair)?: A Lot Help needed to walk in hospital room?: Total Help needed climbing 3-5 steps with a railing? : Total 6 Click Score: 12    End of Session Equipment Utilized During Treatment: Gait belt Activity Tolerance: Patient limited by fatigue Patient left: with call bell/phone within reach;with family/visitor present;in chair;with chair  alarm set Nurse Communication: Mobility status PT Visit Diagnosis: Other abnormalities of gait and mobility (R26.89);Repeated falls (R29.6);Muscle weakness (generalized) (M62.81)     Time: 8992-8962 PT Time Calculation (min) (ACUTE ONLY): 30 min  Charges:    $Gait Training: 8-22 mins $Therapeutic Exercise: 8-22 mins PT General Charges $$ ACUTE PT VISIT: 1 Visit                     Wardell Pokorski M,PT Acute Rehab Services [phone]    Daniel Butler Bevel 09/22/2023, 12:48 PM

## 2023-09-22 NOTE — Progress Notes (Signed)
 Hospitalist and Heart tem informed of HR and BP.  Pt back in bed now. He had Ativan for anxiety.

## 2023-09-22 NOTE — TOC Progression Note (Signed)
 Transition of Care [facility]) - Progression Note    Patient Details  Name: Daniel Butler MRN: [id number] Date of Birth: 12-Sep-1965  [facility] of [facility]) CM/SW Contact  Luise JAYSON Pan, [place] Phone Number: 09/22/2023, 4:47 PM  Clinical Narrative:   CSW spoke with pt and wife regarding request fro SNF. Pts wife, Madelin, stated they are open to paying for 1 week stay at a SNF at this time. [name] asked pt to see if [facility] is able to accept patient. [name] stated she prefers her husband go to short term rehab in the [place] area.   CSW completed SNF workup and will follow up regarding referrals.     Expected Discharge Plan: Home/Self Care Barriers to Discharge: Continued Medical Work up  Expected Discharge Plan and Services In-house Referral: NA Discharge Planning Services: CM Consult Post Acute Care Choice: NA Living arrangements for the past 2 months: Single Family Home                 DME Arranged: N/A DME Agency: NA       HH Arranged: NA           Social Determinants of Health (SDOH) Interventions SDOH Screenings   Food Insecurity: No Food Insecurity (09/18/2023)  Housing: Low Risk  (09/18/2023)  Transportation Needs: No Transportation Needs (09/18/2023)  Utilities: Not At Risk (09/18/2023)  Alcohol  Screen: Low Risk  (12/20/2022)  Depression (PHQ2-9): Low Risk  (12/20/2022)  Financial Resource Strain: High Risk (12/20/2022)  Physical Activity: Unknown (12/20/2022)  Social Connections: Socially Isolated (12/20/2022)  Stress: Stress Concern Present (12/20/2022)  Tobacco Use: High Risk (09/18/2023)    Readmission Risk Interventions    09/01/2023    4:24 PM  Readmission Risk Prevention Plan  Transportation Screening Complete  PCP or Specialist Appt within 5-7 Days Complete  Home Care Screening Complete  Medication Review (RN CM) Complete

## 2023-09-22 NOTE — Progress Notes (Addendum)
  Progress Note   Patient: Daniel Butler DOB: 15-Apr-1966 DOA: 09/18/2023     4 DOS: the patient was seen and examined on 09/22/2023   Brief hospital course: Daniel Butler was admitted to the hospital with the working diagnosis of decompensated heart failure.   58 y.o. 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  01/11 volume status is improving. No signs of alcohol  withdrawal.  01/12 improved volume status, transitioned to oral loop diuretic, may need SNF.  01/13 clinically improving, possible transfer to SNF private pay.   Assessment and Plan: * Acute on chronic diastolic CHF (congestive heart failure) (HCC) Echocardiogram with preserved LV systolic function with EF 60 to 65%, mild LVH, RV systolic function preserved, LA with moderate dilatation, RA with moderate dilatation, small pericardial effusion,   Urine output is 925 ml Systolic blood pressure 100's   Continue with metoprolol   and  SGLT 2 inh.  Furosemide  60 mg po daily.  No RAAS inhibition due to acutely reduced GFR.   Ted hose   Persistent atrial fibrillation (HCC) Continue rate control with metoprolol  and diltiazem   Anticoagulation with apixaban .   Alcohol  use disorder, severe, dependence (HCC) Patient with no signs of acute alcohol  withdrawal,  Continue with lorazepam  1 mg as needed every 6 hr.  Continue close monitoring.   Chronic kidney disease, stage 3a (HCC) AKI, Hypokalemia.   Renal function with serum cr at 1,79 with K at 3,2 and serum bicarbonate at 23  Na 133   Will add 40 meq Kcl x2 Follow up renal function in am.  Continue diuretic therapy.   Anemia of chronic disease Stable cell count.   Chronic ulcer of left foot (HCC) Continue with local wound  care Hold on systemic antibiotic therapy.  Follow up cell count is 9,0         Subjective: Patient is feeling better, no chest pain or dyspnea, lower extremity edema has improved   Physical Exam: Vitals:   09/22/23 0450 09/22/23 0712 09/22/23 1115 09/22/23 1615  BP: 94/68 102/61 (!) 89/70 104/61  Pulse: 98 95 (!) 118 95  Resp: 20 19 20 18   Temp: 97.7 F (36.5 C) 98.8 F (37.1 C) (!) 97.4 F (36.3 C) 98.1 F (36.7 C)  TempSrc: Oral Oral Oral Oral  SpO2: 99% 97% 99% 98%  Weight: 113.9 kg     Height:       Neurology awake and alert ENT with mild pallor Cardiovascular with S1 and S2 present, irregularly irregular wit no gallops, rubs or murmurs Respiratory with no rales or wheezing, no rhonchi Abdomen with no distention  Trace lower extremity edema    Data Reviewed:    Family Communication: I spoke with patient's wife at the bedside, we talked in detail about patient's condition, plan of care and prognosis and all questions were addressed.   Disposition: Status is: Inpatient Remains inpatient appropriate because: possible SNF in am.   Planned Discharge Destination: Skilled nursing facility      Author: Elidia Toribio Furnace, MD 09/22/2023 5:15 PM  For on call review [email or web address].

## 2023-09-22 NOTE — Plan of Care (Signed)
  Problem: Elimination: Goal: Will not experience complications related to bowel motility Outcome: Progressing   Problem: Safety: Goal: Ability to remain free from injury will improve Outcome: Progressing   Problem: Health Behavior/Discharge Planning: Goal: Ability to manage health-related needs will improve Outcome: Not Progressing

## 2023-09-23 DIAGNOSIS — D638 Anemia in other chronic diseases classified elsewhere: Secondary | ICD-10-CM | POA: Diagnosis not present

## 2023-09-23 DIAGNOSIS — L97529 Non-pressure chronic ulcer of other part of left foot with unspecified severity: Secondary | ICD-10-CM | POA: Diagnosis not present

## 2023-09-23 DIAGNOSIS — I5033 Acute on chronic diastolic (congestive) heart failure: Secondary | ICD-10-CM | POA: Diagnosis not present

## 2023-09-23 DIAGNOSIS — I4819 Other persistent atrial fibrillation: Secondary | ICD-10-CM | POA: Diagnosis not present

## 2023-09-23 DIAGNOSIS — N1831 Chronic kidney disease, stage 3a: Secondary | ICD-10-CM | POA: Diagnosis not present

## 2023-09-23 LAB — BASIC METABOLIC PANEL
Anion gap: 8 (ref 5–15)
BUN: 16 mg/dL (ref 6–20)
CO2: 24 mmol/L (ref 22–32)
Calcium: 8.1 mg/dL — ABNORMAL LOW (ref 8.9–10.3)
Chloride: 102 mmol/L (ref 98–111)
Creatinine, Ser: 1.71 mg/dL — ABNORMAL HIGH (ref 0.61–1.24)
GFR, Estimated: 46 mL/min — ABNORMAL LOW (ref >60)
Glucose, Bld: 104 mg/dL — ABNORMAL HIGH (ref 70–99)
Potassium: 3.7 mmol/L (ref 3.5–5.1)
Sodium: 134 mmol/L — ABNORMAL LOW (ref 135–145)

## 2023-09-23 MED ORDER — ADULT MULTIVITAMIN W/MINERALS CH: 1.0000 | ORAL_TABLET | Freq: Every day | ORAL | 0 refills | Status: DC

## 2023-09-23 MED ORDER — EMPAGLIFLOZIN 10 MG PO TABS: 10.0000 mg | ORAL_TABLET | Freq: Every day | ORAL | 0 refills | Status: DC

## 2023-09-23 MED ORDER — ACETAMINOPHEN 325 MG PO TABS: 650.0000 mg | ORAL_TABLET | Freq: Four times a day (QID) | ORAL | Status: DC | PRN

## 2023-09-23 MED ORDER — POLYETHYLENE GLYCOL 3350 17 G PO PACK: 17.0000 g | PACK | Freq: Two times a day (BID) | ORAL | 0 refills | Status: DC

## 2023-09-23 MED ORDER — LORAZEPAM 1 MG PO TABS: 1.0000 mg | ORAL_TABLET | Freq: Four times a day (QID) | ORAL | 0 refills | Status: DC | PRN

## 2023-09-23 MED ORDER — FUROSEMIDE 20 MG PO TABS: 60.0000 mg | ORAL_TABLET | Freq: Every day | ORAL | 0 refills | Status: DC

## 2023-09-23 NOTE — Plan of Care (Signed)

## 2023-09-23 NOTE — Plan of Care (Signed)
  Problem: Health Behavior/Discharge Planning: Goal: Ability to manage health-related needs will improve Outcome: Progressing   Problem: Clinical Measurements: Goal: Respiratory complications will improve Outcome: Progressing   Problem: Nutrition: Goal: Adequate nutrition will be maintained Outcome: Progressing   Problem: Elimination: Goal: Will not experience complications related to bowel motility Outcome: Progressing   Problem: Safety: Goal: Ability to remain free from injury will improve Outcome: Progressing   Problem: Skin Integrity: Goal: Risk for impaired skin integrity will decrease Outcome: Progressing

## 2023-09-23 NOTE — Discharge Summary (Addendum)
 Physician Discharge Summary   Patient: Daniel Butler MRN: [id number] DOB: 12/15/1965  Admit date:     09/18/2023  Discharge date: 09/23/23  Discharge Physician: Elidia Sieving Aiysha Jillson   PCP: Jason Leita Repine, FNP   Recommendations at discharge:    Patient will continue diuresis with furosemide  60 mg po daily and empagliflozin   To consider RAAS inhibition with ARB, as outpatient when renal function more stable.  Continue use TED hose for peripheral edema at his lower extremities. Continue as needed lorazepam  for anxiety.  Follow up renal function and electrolytes in 7 as outpatient.  Follow up with Leita Jason FNP in 7 to 10 days.  Discharge Diagnoses: Principal Problem:   Acute on chronic diastolic CHF (congestive heart failure) (HCC) Active Problems:   Persistent atrial fibrillation (HCC)   Alcohol  use disorder, severe, dependence (HCC)   Chronic kidney disease, stage 3a (HCC)   Anemia of chronic disease   Chronic ulcer of left foot (HCC)  Resolved Problems:   * No resolved hospital problems. *  Hospital Course: Daniel Butler was admitted to the hospital with the working diagnosis of decompensated heart failure.   58 y.o. 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 Recent hospitalization for symptomatic anemia 12/22 to 09/03/23, required 4 units PRBC transfusion. At home he remained very weak and deconditioned, poor ambulation. He stopped his furosemide , due to difficulty moving to the bathroom. He developed progressive weakness and lower extremity edema, prompting EMS call. He was found with 02 saturation 88% was placed on supplemental 02 per [place] 2 L/min, increasing 02 saturation to 96% and then transported to the ED.  On his initial physical  examination his blood pressure was 126/88, HR 93, RR 18 and 02 saturation 97%, lungs with no wheezing or rhonchi, positive rales, heart with S1 and S2 present, irregularly irregular, no gallops, abdomen soft and positive lower extremity edema +++ (pitting)   Na 140 K 3,7 Cl 102, bicarbonate 24, glucose 93 bun 12 cr 1,57  BNP 217  High sensitive troponin 5 and 5  Wbc 7,8 hgb 10.9 plt 247   Chest radiograph with cardiomegaly, with bilateral hilar vascular congestion with no effusions or infiltrates.   EKG 74 bpm, normal axis, right bundle branch block, atrial fibrillation rhythm with q wave lead I and aVL, no significant ST segment or T wave changes.    01/11 volume status is improving. No signs of alcohol  withdrawal.  01/12 improved volume status, transitioned to oral loop diuretic, may need SNF.  01/13 clinically improving, possible transfer to SNF private pay.  01/14 patient medically stable for transfer to SNF today.   Assessment and Plan: * Acute on chronic diastolic CHF (congestive heart failure) (HCC) Echocardiogram with preserved LV systolic function with EF 60 to 65%, mild LVH, RV systolic function preserved, LA with moderate dilatation, RA with moderate dilatation, small pericardial effusion,   Patient was placed on IV furosemide  for diuresis, negative fluid balance was achieved -11,202 ml with significant improvement in his symptoms.  Patient lost about 12 Kg during this hospitalization.  Systolic blood pressure 100's   Continue with metoprolol   and  SGLT 2 inh.  Furosemide  60 mg po daily.  No RAAS inhibition due to acutely reduced GFR.  Continue Ted hose.    Persistent  atrial fibrillation (HCC) Continue rate control with metoprolol  and diltiazem   Anticoagulation with apixaban .   Alcohol  use disorder, severe, dependence (HCC) Patient with no signs of acute alcohol  withdrawal,  Continue with lorazepam  1 mg as needed every 6 hr.    Chronic kidney disease, stage 3a  (HCC) AKI, Hypokalemia.   Volume status has improved, at the time of his discharge his serum cr is 1,71 with K at 3,7 and serum bicarbonate at 24 Na 134   Plan to continue diuresis with furosemide  and SGLT 2 inh, follow up renal function and electrolytes in 7 days as outpatient.   Anemia of chronic disease Stable cell count.   Chronic ulcer of left foot (HCC) Continue with local wound care Hold on systemic antibiotic therapy.  Follow up cell count is 9,0          Consultants: none  Procedures performed: none   Disposition: Skilled nursing facility Diet recommendation:  Cardiac diet DISCHARGE MEDICATION: Allergies as of 09/23/2023       Reactions   Zetia [ezetimibe] Nausea And Vomiting        Medication List     STOP taking these medications    ALPRAZolam  0.5 MG tablet Commonly known as: XANAX    diltiazem  30 MG tablet Commonly known as: CARDIZEM        TAKE these medications    acetaminophen  325 MG tablet Commonly known as: TYLENOL  Take 2 tablets (650 mg total) by mouth every 6 (six) hours as needed for mild pain (pain score 1-3) or moderate pain (pain score 4-6).   allopurinol  100 MG tablet Commonly known as: ZYLOPRIM  Take 1 tablet (100 mg total) by mouth daily.   apixaban  5 MG Tabs tablet Commonly known as: ELIQUIS  Take 1 tablet (5 mg total) by mouth 2 (two) times daily.   diltiazem  180 MG 24 hr capsule Commonly known as: CARDIZEM  CD Take 1 capsule (180 mg total) by mouth daily.   empagliflozin  10 MG Tabs tablet Commonly known as: JARDIANCE  Take 1 tablet (10 mg total) by mouth daily. Start taking on: September 24, 2023   fenofibrate  145 MG tablet Commonly known as: TRICOR  Take 1 tablet by mouth once daily   folic acid  1 MG tablet Commonly known as: FOLVITE  Take 1 tablet (1 mg total) by mouth daily.   furosemide  20 MG tablet Commonly known as: LASIX  Take 3 tablets (60 mg total) by mouth daily. Start taking on: September 24, 2023 What  changed:  medication strength how much to take   icosapent  Ethyl 1 g capsule Commonly known as: Vascepa  Take 2 capsules (2 g total) by mouth 2 (two) times daily.   LORazepam  1 MG tablet Commonly known as: ATIVAN  Take 1 tablet (1 mg total) by mouth every 6 (six) hours as needed for anxiety.   metoprolol  succinate 50 MG 24 hr tablet Commonly known as: TOPROL -XL Take 1 tablet (50 mg total) by mouth daily. Take with or immediately following a meal.   multivitamin with minerals Tabs tablet Take 1 tablet by mouth daily. Start taking on: September 24, 2023   pantoprazole  40 MG tablet Commonly known as: PROTONIX  Take 1 tablet (40 mg total) by mouth 2 (two) times daily.   polyethylene glycol 17 g packet Commonly known as: MIRALAX  / GLYCOLAX  Take 17 g by mouth 2 (two) times daily.   rosuvastatin  40 MG tablet Commonly known as: CRESTOR  Take 1 tablet (40 mg total) by mouth daily.   thiamine  100 MG tablet Commonly known as: Vitamin  B-1 Take 100 mg by mouth daily.   traZODone  50 MG tablet Commonly known as: DESYREL  Take 0.5-1 tablets (25-50 mg total) by mouth at bedtime as needed for sleep. What changed: how much to take               Discharge Care Instructions  (From admission, onward)           Start     Ordered   09/23/23 0000  Discharge wound care:       Comments: : Right foot: cover with foam dressing, change every 3 days. Left Foot: apply xeroform (change daily) on the wound bed, cover with foam dressing, change every 3 days.   09/23/23 1137            Follow-up Information     Jason Leita Repine, FNP. Go on 09/25/2023.   Specialty: Internal Medicine Why: @10 :40am please arrive 15 minutes early Contact information: [address] [place] [address] [phone]                Discharge Exam: Filed Weights   09/21/23 0425 09/22/23 0450 09/23/23 0415  Weight: 111.6 kg 113.9 kg 108.6 kg   BP 105/83 (BP Location: Left Arm)    Pulse 100   Temp 97.6 F (36.4 C) (Oral)   Resp 19   Ht 6' 1 (1.854 m)   Wt 108.6 kg   SpO2 99%   BMI 31.59 kg/m   Patient is feeling better, no dyspnea and edema had significant improvement. He is seating in the chair at the side of the bed. Continue very weak and deconditioned not back to his baseline   Neurology awake and alert ENT with mild pallor no icterus  Cardiovascular with S1 and S2 present, irregularly irregular with no gallops, rubs or murmurs No JVD Only trace lower extremity edema (ted Hose in place) Respiratory with no rales or wheezing, no rhonchi Abdomen with no distention  Left foot dorsum open wound with no purulence. Dressing in place   Condition at discharge: stable  The results of significant diagnostics from this hospitalization (including imaging, microbiology, ancillary and laboratory) are listed below for reference.   Imaging Studies: ECHOCARDIOGRAM COMPLETE Result Date: 09/19/2023    ECHOCARDIOGRAM REPORT   Patient Name:   KIYOSHI SCHAAB Date of Exam: 09/19/2023 Medical Rec #:  [id number]           Height:       73.0 in Accession #:    [id number]          Weight:       252.2 lb Date of Birth:  May 14, 1966           BSA:          2.375 m Patient Age:    57 years            BP:           146/62 mmHg Patient Gender: M                   HR:           130 bpm. Exam Location:  Inpatient Procedure: 2D Echo, Cardiac Doppler, Color Doppler and Intracardiac            Opacification Agent Indications:    CHF- Acute Diastolic  History:        Patient has prior history of Echocardiogram examinations, most  recent 01/06/2023. Risk Factors:Hypertension and Current Smoker.  Sonographer:    Ozell Free Referring Phys: [id number] VISHAL R PATEL  Sonographer Comments: Technically challenging study due to limited acoustic windows. Image acquisition challenging due to patient body habitus. IMPRESSIONS  1. Left ventricular ejection fraction, by estimation, is 60 to 65%.  The left ventricle has normal function. The left ventricle has no regional wall motion abnormalities. There is mild concentric left ventricular hypertrophy. Left ventricular diastolic parameters are indeterminate.  2. Right ventricular systolic function is normal. The right ventricular size is normal. Moderately increased right ventricular wall thickness.  3. Left atrial size was moderately dilated.  4. Right atrial size was moderately dilated.  5. There appears to be a prominent anterior fat pad but cannot exclude complex effusion. a small pericardial effusion is present. The pericardial effusion is posterior to the left ventricle.  6. The mitral valve is normal in structure. No evidence of mitral valve regurgitation. No evidence of mitral stenosis.  7. The aortic valve is normal in structure. Aortic valve regurgitation is not visualized. No aortic stenosis is present.  8. Aortic dilatation noted. There is borderline dilatation of the aortic root, measuring 38 mm.  9. The inferior vena cava is dilated in size with <50% respiratory variability, suggesting right atrial pressure of 15 mmHg. Conclusion(s)/Recommendation(s): Very diffifcult study due to poor sound mave transmission. There is biventricular hypertrophy and there appears to be a prominent anterior pericardial fat pad but cannot exclude complex effusion. Would consider cardiac MRI to further evalaute if clinically indicated. FINDINGS  Left Ventricle: Left ventricular ejection fraction, by estimation, is 60 to 65%. The left ventricle has normal function. The left ventricle has no regional wall motion abnormalities. Definity  contrast agent was given IV to delineate the left ventricular  endocardial borders. The left ventricular internal cavity size was normal in size. There is mild concentric left ventricular hypertrophy. Left ventricular diastolic parameters are indeterminate. Right Ventricle: The right ventricular size is normal. Moderately increased right  ventricular wall thickness. Right ventricular systolic function is normal. Left Atrium: Left atrial size was moderately dilated. Right Atrium: Right atrial size was moderately dilated. Pericardium: There appears to be a prominent anterior fat pad but cannot exclude complex effusion. A small pericardial effusion is present. The pericardial effusion is posterior to the left ventricle. Mitral Valve: The mitral valve is normal in structure. No evidence of mitral valve regurgitation. No evidence of mitral valve stenosis. Tricuspid Valve: The tricuspid valve is normal in structure. Tricuspid valve regurgitation is not demonstrated. No evidence of tricuspid stenosis. Aortic Valve: The aortic valve is normal in structure. Aortic valve regurgitation is not visualized. No aortic stenosis is present. Aortic valve mean gradient measures 1.0 mmHg. Aortic valve peak gradient measures 2.8 mmHg. Aortic valve area, by VTI measures 3.09 cm. Pulmonic Valve: The pulmonic valve was not well visualized. Pulmonic valve regurgitation is not visualized. No evidence of pulmonic stenosis. Aorta: The aortic root is normal in size and structure and aortic dilatation noted. There is borderline dilatation of the aortic root, measuring 38 mm. Venous: The inferior vena cava is dilated in size with less than 50% respiratory variability, suggesting right atrial pressure of 15 mmHg. IAS/Shunts: No atrial level shunt detected by color flow Doppler.  LEFT VENTRICLE PLAX 2D LVIDd:         5.00 cm   Diastology LVIDs:         4.00 cm   LV e' medial:    9.28 cm/s LV PW:  1.60 cm   LV E/e' medial:  12.6 LV IVS:        1.30 cm   LV e' lateral:   11.47 cm/s LVOT diam:     2.30 cm   LV E/e' lateral: 10.2 LV SV:         39 LV SV Index:   16 LVOT Area:     4.15 cm  RIGHT VENTRICLE          IVC RV Basal diam:  3.80 cm  IVC diam: 3.60 cm TAPSE (M-mode): 2.8 cm LEFT ATRIUM              Index        RIGHT ATRIUM           Index LA diam:        4.60 cm  1.94  cm/m   RA Area:     23.80 cm LA Vol (A2C):   117.0 ml 49.27 ml/m  RA Volume:   78.10 ml  32.89 ml/m LA Vol (A4C):   103.0 ml 43.38 ml/m LA Biplane Vol: 114.0 ml 48.01 ml/m  AORTIC VALVE AV Area (Vmax):    3.68 cm AV Area (Vmean):   3.71 cm AV Area (VTI):     3.09 cm AV Vmax:           83.67 cm/s AV Vmean:          54.233 cm/s AV VTI:            0.126 m AV Peak Grad:      2.8 mmHg AV Mean Grad:      1.0 mmHg LVOT Vmax:         74.10 cm/s LVOT Vmean:        48.433 cm/s LVOT VTI:          0.094 m LVOT/AV VTI ratio: 0.74  AORTA Ao Root diam: 3.80 cm Ao Asc diam:  3.00 cm MITRAL VALVE MV Area (PHT): 6.27 cm     SHUNTS MV Decel Time: 121 msec     Systemic VTI:  0.09 m MV E velocity: 117.00 cm/s  Systemic Diam: 2.30 cm Toribio Fuel MD Electronically signed by Toribio Fuel MD Signature Date/Time: 09/19/2023/9:19:17 AM    Final    Abd 1 View (KUB) Result Date: 09/18/2023 CLINICAL DATA:  Constipation EXAM: ABDOMEN - 1 VIEW COMPARISON:  12/15/2020 FINDINGS: Nonobstructed bowel-gas pattern with moderate stool burden. Clips in the pelvis. IMPRESSION: Nonobstructed bowel-gas pattern with moderate stool burden. Electronically Signed   By: Luke Bun M.D.   On: 09/18/2023 15:21   DG Chest Portable 1 View Result Date: 09/18/2023 CLINICAL DATA:  Generalized weak in with bilateral extremity edema. EXAM: PORTABLE CHEST 1 VIEW COMPARISON:  Radiographs 01/02/2021 and 12/21/2020.  CT 08/31/2023. FINDINGS: 1037 hours. The heart is moderately enlarged, but stable. The lungs appear clear. There is no pleural effusion or pneumothorax. The bones appear unremarkable. IMPRESSION: Stable cardiomegaly. No evidence of acute cardiopulmonary process. Electronically Signed   By: Elsie Perone M.D.   On: 09/18/2023 10:44   CT CHEST ABDOMEN PELVIS W CONTRAST Result Date: 08/31/2023 CLINICAL DATA:  Poly trauma. Status post fall striking head on sink. On anticoagulation therapy. EXAM: CT CHEST, ABDOMEN, AND PELVIS WITH  CONTRAST TECHNIQUE: Multidetector CT imaging of the chest, abdomen and pelvis was performed following the standard protocol during bolus administration of intravenous contrast. RADIATION DOSE REDUCTION: This exam was performed according to the departmental dose-optimization program which includes automated exposure control,  adjustment of the mA and/or kV according to patient size and/or use of iterative reconstruction technique. CONTRAST:  75mL OMNIPAQUE  IOHEXOL  350 MG/ML SOLN COMPARISON:  12/15/2020 FINDINGS: CT CHEST FINDINGS Cardiovascular: Heart size is upper limits of normal. There is no pericardial effusion. Mild aortic atherosclerosis. Lad coronary artery calcifications. Mediastinum/Nodes: No enlarged mediastinal, hilar, or axillary lymph nodes. Thyroid gland, trachea, and esophagus demonstrate no significant findings. Lungs/Pleura: There is a small right pleural effusion, image 52/3. There is ground-glass attenuation and subpleural consolidation within the right lower lobe, image 138/7. No pneumothorax identified. Stable left upper lobe lung nodule measuring 4 mm, image 51/7. Musculoskeletal: No chest wall mass or suspicious bone lesions identified. No fractures. CT ABDOMEN PELVIS FINDINGS Hepatobiliary: No hepatic injury or perihepatic hematoma. Gallbladder is unremarkable. Pancreas: Unremarkable. No pancreatic ductal dilatation or surrounding inflammatory changes. Spleen: No splenic injury or perisplenic hematoma. Adrenals/Urinary Tract: No adrenal hemorrhage or renal injury identified. Bladder is unremarkable. Upper pole left kidney cyst measures 10 mm and 21 Hounsfield units compatible with a benign Bosniak class 2 cyst. No follow-up imaging recommended. Stomach/Bowel: Stomach is within normal limits. Appendix appears normal. No evidence of bowel wall thickening, distention, or inflammatory changes. Vascular/Lymphatic: Aortic atherosclerosis. No enlarged abdominal or pelvic lymph nodes. Reproductive:  Mild prostate gland enlargement Other: No free fluid or fluid collections. Fat containing umbilical hernia. Musculoskeletal: Areas of subcutaneous soft tissue stranding are noted along bilateral flanks and overlying both hips. No acute or suspicious osseous findings. L4-5 degenerative disc disease. IMPRESSION: 1. Small right pleural effusion. 2. Ground-glass attenuation and subpleural consolidation within the right lower lobe. Differential considerations include pneumonia, aspiration, or in the acute posttraumatic setting pulmonary contusion. No associated overlying rib fractures identified. 3. Coronary artery calcifications. 4. Stable 4 mm left upper lobe lung nodule. No follow-up imaging recommended. 5. Areas of subcutaneous soft tissue stranding are noted along bilateral flanks and overlying both hips. Findings may reflect anasarca or areas of superficial contusion. Clinical correlation advised. 6.  Aortic Atherosclerosis (ICD10-I70.0). Electronically Signed   By: Waddell Calk M.D.   On: 08/31/2023 07:07   CT HEAD WO CONTRAST ( ) Result Date: 08/31/2023 CLINICAL DATA:  Blunt poly trauma.  Head trauma on Eliquis . EXAM: CT HEAD WITHOUT CONTRAST CT CERVICAL SPINE WITHOUT CONTRAST TECHNIQUE: Multidetector CT imaging of the head and cervical spine was performed following the standard protocol without intravenous contrast. Multiplanar CT image reconstructions of the cervical spine were also generated. RADIATION DOSE REDUCTION: This exam was performed according to the departmental dose-optimization program which includes automated exposure control, adjustment of the mA and/or kV according to patient size and/or use of iterative reconstruction technique. COMPARISON:  12/15/2020 FINDINGS: CT HEAD FINDINGS Brain: No evidence of acute infarction, hemorrhage, hydrocephalus, extra-axial collection or mass lesion/mass effect. 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 Encephalomalacia in the  right frontal lobe beneath a burr hole for presumed ventricular drain. No new finding Vascular: Right-sided aneurysm clip as noted above. Atheromatous calcification. Skull: Unremarkable right pterional craniotomy Sinuses/Orbits: No evidence of injury CT CERVICAL SPINE FINDINGS Alignment: Normal. Skull base and vertebrae: No acute fracture. No primary bone lesion or focal pathologic process. Soft tissues and spinal canal: No prevertebral fluid or swelling. No visible canal hematoma. Disc levels:  No significant degeneration. Upper chest: Reported separately IMPRESSION: No evidence of acute intracranial or cervical spine injury. Electronically Signed   By: Dorn Roulette M.D.   On: 08/31/2023 06:55   CT Cervical Spine Wo Contrast Result Date: 08/31/2023  CLINICAL DATA:  Blunt poly trauma.  Head trauma on Eliquis . EXAM: CT HEAD WITHOUT CONTRAST CT CERVICAL SPINE WITHOUT CONTRAST TECHNIQUE: Multidetector CT imaging of the head and cervical spine was performed following the standard protocol without intravenous contrast. Multiplanar CT image reconstructions of the cervical spine were also generated. RADIATION DOSE REDUCTION: This exam was performed according to the departmental dose-optimization program which includes automated exposure control, adjustment of the mA and/or kV according to patient size and/or use of iterative reconstruction technique. COMPARISON:  12/15/2020 FINDINGS: CT HEAD FINDINGS Brain: No evidence of acute infarction, hemorrhage, hydrocephalus, extra-axial collection or mass lesion/mass effect. 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 Encephalomalacia in the right frontal lobe beneath a burr hole for presumed ventricular drain. No new finding Vascular: Right-sided aneurysm clip as noted above. Atheromatous calcification. Skull: Unremarkable right pterional craniotomy Sinuses/Orbits: No evidence of injury CT CERVICAL SPINE FINDINGS Alignment: Normal. Skull base and  vertebrae: No acute fracture. No primary bone lesion or focal pathologic process. Soft tissues and spinal canal: No prevertebral fluid or swelling. No visible canal hematoma. Disc levels:  No significant degeneration. Upper chest: Reported separately IMPRESSION: No evidence of acute intracranial or cervical spine injury. Electronically Signed   By: Dorn Roulette M.D.   On: 08/31/2023 06:55    Microbiology: Results for orders placed or performed during the hospital encounter of 01/18/21  SARS CORONAVIRUS 2 (TAT 6-24 HRS) Nasopharyngeal Nasopharyngeal Swab     Status: None   Collection Time: 01/18/21 12:31 PM   Specimen: Nasopharyngeal Swab  Result Value Ref Range Status   SARS Coronavirus 2 NEGATIVE NEGATIVE Final    Comment: (NOTE) SARS-CoV-2 target nucleic acids are NOT DETECTED.  The SARS-CoV-2 RNA is generally detectable in upper and lower respiratory specimens during the acute phase of infection. Negative results do not preclude SARS-CoV-2 infection, do not rule out co-infections with other pathogens, and should not be used as the sole basis for treatment or other patient management decisions. Negative results must be combined with clinical observations, patient history, and epidemiological information. The expected result is Negative.  Fact Sheet for Patients: hairslick.no  Fact Sheet for Healthcare Providers: quierodirigir.com  This test is not yet approved or cleared by the [place]  FDA and  has been authorized for detection and/or diagnosis of SARS-CoV-2 by FDA under an Emergency Use Authorization (EUA). This EUA will remain  in effect (meaning this test can be used) for the duration of the COVID-19 declaration under Se ction 564(b)(1) of the Act, 21 U.S.C. section 360bbb-3(b)(1), unless the authorization is terminated or revoked sooner.  Performed at [facility], [address], [place],  [place] [address]     Labs: CBC: Recent Labs  Lab 09/18/23 [phone] 09/18/23 0818 09/19/23 0247 09/21/23 0235  WBC 7.8  --  10.0 9.0  NEUTROABS 5.3  --   --   --   HGB 10.9* 12.2* 10.5* 8.9*  HCT 37.1* 36.0* 34.3* 29.8*  MCV 82.8  --  79.0* 82.3  PLT 247  --  226 170   Basic Metabolic Panel: Recent Labs  Lab 09/19/23 0247 09/20/23 0224 09/21/23 0235 09/22/23 0235 09/23/23 0232  NA 140 137 135 133* 134*  K 3.3* 3.2* 3.7 3.2* 3.7  CL 100 102 103 101 102  CO2 27 26 24 23 24   GLUCOSE 106* 101* 100* 100* 104*  BUN 10 14 15 14 16   CREATININE 1.57* 1.85* 1.89* 1.79* 1.71*  CALCIUM  8.3* 7.9* 8.0* 8.0*  8.1*  MG 1.6* 2.4 2.1  --   --    Liver Function Tests: Recent Labs  Lab 09/18/23 0811  AST 28  ALT 14  ALKPHOS 70  BILITOT 0.6  PROT 6.7  ALBUMIN  2.8*   CBG: Recent Labs  Lab 09/19/23 0608  GLUCAP 108*    Discharge time spent: greater than 30 minutes.  Signed: Elidia Toribio Furnace, MD [facility] 09/23/2023

## 2023-09-23 NOTE — TOC Progression Note (Addendum)
 Transition of Care [facility]) - Progression Note    Patient Details  Name: Daniel Butler MRN: [id number] Date of Birth: 02-Dec-1965  Transition of Care [facility]) CM/SW Contact  Daniel Butler, [place] Phone Number: 09/23/2023, 10:30 AM  Clinical Narrative:   CSW spoke with pt and wife regarding dc plan for SNF. CSW informed Daniel Butler (wife) that SNFs have 30 day payment upfront policy. Daniel Butler gave her verbal understanding and inquired about pt having HH; but would rather pt to go to SNF. CSW explained that RNCM work on home discharges and can explain HH further. CSW notfied RNCM about Daniel Butler's inquiry for [facility] and will update RNCM if Daniel Butler decides to look at [facility] for pt.   CSW has been told by [facility], [facility], and [facility] admissions coordinators have all stated pt would need to pay for 30 days. [facility] states they do not accept private pay at this time.   CSW spoke with Daniel Butler and she is open to paying out of pocket for SNF at this time. Daniel Butler states she would like pt to go to [facility] as first choice, [facility] as second choice. Referral has been faxes to [facility] and [facility] and CSW reached out to admissions coordinators from both facilities to review.  11:30AM: [facility] unable to offer bed at this time.  1:03PM: CSW reached out to [facility] regarding referral. Per Daniel Butler, referral was sent to higher ups and they are reviewing at this time.   CSW reached out to Daniel Butler from [facility] and [facility] to look at pt referral, Daniel Butler states facilities BOM will need to call pts wife and discuss potential SNF admission.   Expected Discharge Plan: Home/Self Care Barriers to Discharge: Continued Medical Work up  Expected Discharge Plan and Services In-house Referral: NA Discharge Planning Services: CM Consult Post Acute Care Choice: NA Living arrangements for the past 2 months: Single Family Home                 DME Arranged: N/A DME Agency: NA       HH Arranged: NA            Social Determinants of Health (SDOH) Interventions SDOH Screenings   Food Insecurity: No Food Insecurity (09/18/2023)  Housing: Low Risk  (09/18/2023)  Transportation Needs: No Transportation Needs (09/18/2023)  Utilities: Not At Risk (09/18/2023)  Alcohol  Screen: Low Risk  (12/20/2022)  Depression (PHQ2-9): Low Risk  (12/20/2022)  Financial Resource Strain: High Risk (12/20/2022)  Physical Activity: Unknown (12/20/2022)  Social Connections: Socially Isolated (12/20/2022)  Stress: Stress Concern Present (12/20/2022)  Tobacco Use: High Risk (09/18/2023)    Readmission Risk Interventions    09/01/2023    4:24 PM  Readmission Risk Prevention Plan  Transportation Screening Complete  PCP or Specialist Appt within 5-7 Days Complete  Home Care Screening Complete  Medication Review (RN CM) Complete

## 2023-09-23 NOTE — TOC Progression Note (Signed)
 Transition of Care [facility]) - Progression Note    Patient Details  Name: Daniel Butler MRN: [id number] Date of Birth: 1966/05/25  Transition of Care [facility]) CM/SW Contact  Waddell Barnie Rama, RN Phone Number: 09/23/2023, 11:03 AM  Clinical Narrative:    NCM was checking with [facility] to see if patient would be able to get [facility] for charity, per Artavia with [facility] they do not have the staffing in [place],  if he goes home he will need to do OP therapy and pay for the therapy.   Expected Discharge Plan: Home/Self Care Barriers to Discharge: Continued Medical Work up  Expected Discharge Plan and Services In-house Referral: NA Discharge Planning Services: CM Consult Post Acute Care Choice: NA Living arrangements for the past 2 months: Single Family Home                 DME Arranged: N/A DME Agency: NA       HH Arranged: NA           Social Determinants of Health (SDOH) Interventions SDOH Screenings   Food Insecurity: No Food Insecurity (09/18/2023)  Housing: Low Risk  (09/18/2023)  Transportation Needs: No Transportation Needs (09/18/2023)  Utilities: Not At Risk (09/18/2023)  Alcohol  Screen: Low Risk  (12/20/2022)  Depression (PHQ2-9): Low Risk  (12/20/2022)  Financial Resource Strain: High Risk (12/20/2022)  Physical Activity: Unknown (12/20/2022)  Social Connections: Socially Isolated (12/20/2022)  Stress: Stress Concern Present (12/20/2022)  Tobacco Use: High Risk (09/18/2023)    Readmission Risk Interventions    09/01/2023    4:24 PM  Readmission Risk Prevention Plan  Transportation Screening Complete  PCP or Specialist Appt within 5-7 Days Complete  Home Care Screening Complete  Medication Review (RN CM) Complete

## 2023-09-23 NOTE — Progress Notes (Signed)
 Mobility Specialist Progress Note:   09/23/23 1149  Mobility  Activity Transferred from bed to chair  Level of Assistance Minimal assist, patient does 75% or more  Assistive Device Four wheel walker  Distance Ambulated (ft) 4 ft  Activity Response Tolerated well  Mobility Referral Yes  Mobility visit 1 Mobility  Mobility Specialist Start Time (ACUTE ONLY) 0920  Mobility Specialist Stop Time (ACUTE ONLY) 0933  Mobility Specialist Time Calculation (min) (ACUTE ONLY) 13 min   Pt received in bed hesitant but agreeable to mobility. Pt required MinA w/ bed mobility and STS. Took BP throughout session (see below) was able to take a couple of steps towards the chair w/o fault. Situated in chair w/ call bell and personal belongings in reach. All needs met. Chair alarm on.  Pre Mobility Lying BP 103/68 During Mobility Sitting 117/56                          Standing 115/99 Post Mobility Sitting 109/68  Thersia Minder Mobility Specialist  Please contact vis Secure Chat or  Rehab Office [phone]

## 2023-09-24 MED ORDER — DILTIAZEM HCL ER COATED BEADS 240 MG PO CP24
240.0000 mg | ORAL_CAPSULE | Freq: Every day | ORAL | Status: DC
  Administered 2023-09-25: 240 mg via ORAL
  Filled 2023-09-24: qty 1

## 2023-09-24 MED ORDER — DILTIAZEM HCL 60 MG PO TABS: 60.0000 mg | ORAL_TABLET | Freq: Once | ORAL | Status: DC

## 2023-09-24 NOTE — Progress Notes (Signed)
 Physical Therapy Treatment Patient Details Name: Daniel Butler MRN: [id number] DOB: [birth year] Today's Date: 09/24/2023   History of Present Illness Patient is 58 y.o. male BIB EMS 09/17/22 for weakness and bil LE edema. He reports over last week difficulty getting gup to amb or go to bathroom, stopped his lasix , and having SOB. Pt recently admitted 08/31/23-09/03/23 for a fall hitting his head, general weakness, and anemia. CT head neg for acute injury, EGD neg for obvious bleed. 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    PT Comments  Pt admitted with above diagnosis. Pt was able to ambulate with rollator about the same distance as last visit and had to sit and be rolled back to room.  His HR and BP were better today with HR 101-142 bpm andBP 105/78 initially and 118/71 at end of visit.  Pt wanted to return to bed due to fatigue.   Pt currently with functional limitations due to the deficits listed below (see PT Problem List). Pt will benefit from acute skilled PT to increase their independence and safety with mobility to allow discharge.       If plan is discharge home, recommend the following: Assistance with cooking/housework;A little help with walking and/or transfers;Help with stairs or ramp for entrance   Can travel by private vehicle     No  Equipment Recommendations  Hospital bed;Wheelchair (measurements PT);Wheelchair cushion (measurements PT);BSC/3in1 (defer to next venue)    Recommendations for Other Services OT consult     Precautions / Restrictions Precautions Precautions: Fall Precaution Comments: orthostatics Restrictions Weight Bearing Restrictions Per Provider Order: No     Mobility  Bed Mobility Overal bed mobility: Needs Assistance Bed Mobility: Rolling Rolling: Min assist Sidelying to sit: Min assist Supine to sit: Min assist,  Used rails, HOB elevated Sit to supine: Mod assist, Used rails, HOB elevated        Transfers Overall transfer level: Needs assistance Equipment used: Rollator (4 wheels) Transfers: Sit to/from Stand Sit to Stand: Min assist, +2 safety/equipment, From elevated surface           General transfer comment: Needed cues for hand placement as well as incr time to rise. Also had to elevate bed.  HR from 101 bpm at rest to 115 bpm just coming to sit EOB.    Ambulation/Gait Ambulation/Gait assistance: Min assist, +2 safety/equipment Gait Distance (Feet): 110 Feet Assistive device: Rollator (4 wheels) Gait Pattern/deviations: Step-through pattern, Decreased stride length, Decreased step length - right, Decreased step length - left, Knee flexed in stance - right, Knee flexed in stance - left, Trunk flexed, Wide base of support   Gait velocity interpretation: <1.31 ft/sec, indicative of household ambulator   General Gait Details: Pt able to progress ambulation with use of rollator (wife brought pts rollator from home).  Pt ambulated to hallway with chair follow for safety as pt with incr HR and decr BP.  Nurse aware of VS at end of walk.  HR 101-142 bpm.  BP decr with activity as well.  Close to orthostasis.  TED hose in place on arrival.   Needed close chair follow as well and did have to sit in hallway due to dizziness and HR incr.   Stairs             Wheelchair Mobility     Tilt Bed    Modified Rankin (Stroke Patients  Only)       Balance Overall balance assessment: Needs assistance Sitting-balance support: Bilateral upper extremity supported, Feet supported, No upper extremity supported Sitting balance-Leahy Scale: Fair     Standing balance support: Bilateral upper extremity supported, During functional activity Standing balance-Leahy Scale: Poor Standing balance comment: reliant on RW                            Cognition Arousal: Alert Behavior During  Therapy: WFL for tasks assessed/performed Overall Cognitive Status: Impaired/Different from baseline (No family/caregiver present to confirm baseline) Area of Impairment: Memory, Attention, Following commands, Awareness, Safety/judgement, Problem solving                   Current Attention Level: Sustained Memory: Decreased recall of precautions Following Commands: Follows one step commands with increased time, Follows one step commands consistently, Follows multi-step commands inconsistently Safety/Judgement: Decreased awareness of deficits, Decreased awareness of safety Awareness: Emergent Problem Solving: Decreased initiation, Difficulty sequencing, Requires verbal cues, Requires tactile cues, Slow processing General Comments: AAOx4.        Exercises General Exercises - Lower Extremity Ankle Circles/Pumps: AROM, Both, 10 reps, Seated Quad Sets: AROM, Both, 5 reps, Supine Gluteal Sets: AROM, Both, 5 reps, Supine Long Arc Quad: AROM, Both, 10 reps, Seated Heel Slides: AROM, Both, 5 reps, Supine Straight Leg Raises: AROM, Both, 10 reps, Supine    General Comments        Pertinent Vitals/Pain Pain Assessment Pain Assessment: Faces Faces Pain Scale: Hurts little more Pain Location: general back pain Pain Descriptors / Indicators: Aching, Discomfort, Grimacing Pain Intervention(s): Limited activity within patient's tolerance, Monitored during session, Repositioned    Home Living                          Prior Function            PT Goals (current goals can now be found in the care plan section) Acute Rehab PT Goals Patient Stated Goal: return home Progress towards PT goals: Progressing toward goals    Frequency    Min 1X/week      PT Plan      Co-evaluation              AM-PAC PT "6 Clicks" Mobility   Outcome Measure  Help needed turning from your back to your side while in a flat bed without using bedrails?: A Little Help needed  moving from lying on your back to sitting on the side of a flat bed without using bedrails?: A Little Help needed moving to and from a bed to a chair (including a wheelchair)?: A Lot Help needed standing up from a chair using your arms (e.g., wheelchair or bedside chair)?: A Lot Help needed to walk in hospital room?: Total Help needed climbing 3-5 steps with a railing? : Total 6 Click Score: 12    End of Session Equipment Utilized During Treatment: Gait belt Activity Tolerance: Patient limited by fatigue Patient left: with call bell/phone within reach;in bed;with bed alarm set Nurse Communication: Mobility status PT Visit Diagnosis: Other abnormalities of gait and mobility (R26.89);Repeated falls (R29.6);Muscle weakness (generalized) (M62.81)     Time: 1610-9604 PT Time Calculation (min) (ACUTE ONLY): 26 min  Charges:    $Gait Training: 8-22 mins $Therapeutic Exercise: 8-22 mins PT General Charges $$ ACUTE PT VISIT: 1 Visit  [facility] M,PT Acute Rehab Services [phone]    Florencia Hunter 09/24/2023, 3:37 PM

## 2023-09-24 NOTE — Progress Notes (Signed)
 Patient seen and examined, no changes from DC summary as noted by Dr. Sunnie England yesterday -Remains medically stable for discharge to SNF, TOC following  Deforest Fast, MD

## 2023-09-24 NOTE — Progress Notes (Signed)
 Occupational Therapy Treatment Patient Details Name: Daniel Butler MRN: [id number] DOB: 1965-10-19 Today's Date: 09/24/2023   History of present illness Patient is 58 y.o. male BIB EMS 09/17/22 for weakness and bil LE edema. He reports over last week difficulty getting gup to amb or go to bathroom, stopped his lasix , and having SOB. Pt recently admitted 08/31/23-09/03/23 for a fall hitting his head, general weakness, and anemia. CT head neg for acute injury, EGD neg for obvious bleed. 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   OT comments  Pt reporting fatigue after mobility specialist and PT sessions today. Pt needing min encouragement for OT session. Initially agreeable to sitting EOB but with dizziness and requesting again to return to supine. Positive orthostatics (see ADL general comments). Focus session on LUE FM HEP with exercises listed below. Will continue to follow acutely.       If plan is discharge home, recommend the following:  Two people to help with walking and/or transfers;A lot of help with bathing/dressing/bathroom;Assistance with cooking/housework;Assist for transportation;Help with stairs or ramp for entrance   Equipment Recommendations  None recommended by OT    Recommendations for Other Services      Precautions / Restrictions Precautions Precautions: Fall Precaution Comments: orthostatics Restrictions Weight Bearing Restrictions Per Provider Order: No       Mobility Bed Mobility Overal bed mobility: Needs Assistance Bed Mobility: Rolling Rolling: Min assist Sidelying to sit: Min assist Supine to sit: Contact guard     General bed mobility comments: hand held assist    Transfers                   General transfer comment: pt deferred     Balance Overall balance assessment: Needs assistance Sitting-balance  support: Bilateral upper extremity supported, Feet supported, No upper extremity supported Sitting balance-Leahy Scale: Fair                                     ADL either performed or assessed with clinical judgement   ADL Overall ADL's : Needs assistance/impaired     Grooming: Set up;Sitting Grooming Details (indicate cue type and reason): EOB                               General ADL Comments: pt deferred OOB mobility as he has had Mobility specialist and PT today feeling exhausted. Agreeable to ADL at EOB and LUE HEP intitially but then dizzy at EOB with BP 95/59 (68) HR 87; returned to supine and BP 107/73 (80) HR 79    Extremity/Trunk Assessment Upper Extremity Assessment Upper Extremity Assessment: Right hand dominant;LUE deficits/detail LUE Deficits / Details: Gross hand and wrist strength 3/5 with decreased fine motor coordination and pt repotr of intermittent numbness in hand; mild tremor in hand noted during functional tasks; all ROM WNL; strength otherwise WFL and gross motor coordination WFL LUE Coordination: decreased fine motor            Vision       Perception     Praxis      Cognition Arousal: Alert Behavior During Therapy: WFL for tasks assessed/performed Overall Cognitive Status: Impaired/Different from baseline Area of Impairment: Memory, Attention, Following commands, Awareness, Safety/judgement, Problem solving  Current Attention Level: Sustained Memory: Decreased recall of precautions Following Commands: Follows one step commands with increased time, Follows one step commands consistently, Follows multi-step commands inconsistently Safety/Judgement: Decreased awareness of deficits, Decreased awareness of safety Awareness: Emergent Problem Solving: Decreased initiation, Difficulty sequencing, Requires verbal cues, Requires tactile cues, Slow processing General Comments: AAOx4. easily internally  distracted. pt needing min-mod cues to attend to LUE hEP        Exercises Exercises: Hand exercises, Other exercises Hand Exercises Digit Composite Flexion: AROM, Left, Right, 20 reps Composite Extension: AROM, Right, Left, 20 reps Digit Composite Abduction: AROM, Left, Right, 20 reps Digit Composite Adduction: AROM, Right, Left, 20 reps Digit Lifts: AROM, Right, Left, 10 reps Opposition: AROM, Left, Right, 10 reps Other Exercises Other Exercises: provided squeeze ball and soft theraputty for HEP with handout for lumbricle strengthening, lateral pinch, and adduction    Shoulder Instructions       General Comments      Pertinent Vitals/ Pain       Pain Assessment Pain Assessment: Faces Faces Pain Scale: Hurts little more Pain Location: general back pain Pain Descriptors / Indicators: Aching, Discomfort, Grimacing Pain Intervention(s): Limited activity within patient's tolerance, Monitored during session  Home Living                                          Prior Functioning/Environment              Frequency  Min 1X/week        Progress Toward Goals  OT Goals(current goals can now be found in the care plan section)  Progress towards OT goals: Progressing toward goals  Acute Rehab OT Goals Patient Stated Goal: get stronger and be able to walk and return home OT Goal Formulation: With patient Time For Goal Achievement: 10/03/23 Potential to Achieve Goals: Good  Plan      Co-evaluation                 AM-PAC OT "6 Clicks" Daily Activity     Outcome Measure   Help from another person eating meals?: A Little Help from another person taking care of personal grooming?: A Little Help from another person toileting, which includes using toliet, bedpan, or urinal?: Total Help from another person bathing (including washing, rinsing, drying)?: A Lot Help from another person to put on and taking off regular upper body clothing?: A  Little Help from another person to put on and taking off regular lower body clothing?: A Lot 6 Click Score: 14    End of Session Equipment Utilized During Treatment:  (theraputty, squeeze ball)  OT Visit Diagnosis: Other abnormalities of gait and mobility (R26.89);Muscle weakness (generalized) (M62.81);Other (comment)   Activity Tolerance Patient tolerated treatment well   Patient Left in bed;with call bell/phone within reach;with bed alarm set;with nursing/sitter in room   Nurse Communication Mobility status;Other (comment) (asking for anxiety medication)        Time: 9604-5409 OT Time Calculation (min): 28 min  Charges: OT General Charges $OT Visit: 1 Visit OT Treatments $Therapeutic Exercise: 23-37 mins  Karilyn Ouch, OTR/L [facility] Acute Rehabilitation Office: [phone]   Emery Hans 09/24/2023, 4:38 PM

## 2023-09-24 NOTE — Discharge Instructions (Signed)
 Information on my medicine - ELIQUIS  (apixaban )-You were taking this medication prior to this hospital admission and this medication is continued.  Why was Eliquis  prescribed for you? (NOTE: You were taking this medication prior to this hospital admission). Eliquis  was prescribed for you to reduce the risk of a blood clot forming that can cause a stroke if you have a medical condition called atrial fibrillation (a type of irregular heartbeat).  What do You need to know about Eliquis  ? Take your Eliquis  TWICE DAILY - one tablet in the morning and one tablet in the evening with or without food. If you have difficulty swallowing the tablet whole please discuss with your pharmacist how to take the medication safely.  Take Eliquis  exactly as prescribed by your doctor and DO NOT stop taking Eliquis  without talking to the doctor who prescribed the medication.  Stopping may increase your risk of developing a stroke.  Refill your prescription before you run out.  After discharge, you should have regular check-up appointments with your healthcare provider that is prescribing your Eliquis .  In the future your dose may need to be changed if your kidney function or weight changes by a significant amount or as you get older.  What do you do if you miss a dose? If you miss a dose, take it as soon as you remember on the same day and resume taking twice daily.  Do not take more than one dose of ELIQUIS  at the same time to make up a missed dose.  Important Safety Information A possible side effect of Eliquis  is bleeding. You should call your healthcare provider right away if you experience any of the following: Bleeding from an injury or your nose that does not stop. Unusual colored urine (red or dark brown) or unusual colored stools (red or black). Unusual bruising for unknown reasons. A serious fall or if you hit your head (even if there is no bleeding).  Some medicines may interact with Eliquis  and  might increase your risk of bleeding or clotting while on Eliquis . To help avoid this, consult your healthcare provider or pharmacist prior to using any new prescription or non-prescription medications, including herbals, vitamins, non-steroidal anti-inflammatory drugs (NSAIDs) and supplements.  This website has more information on Eliquis  (apixaban ): http://[email or web address] .com/eliquis Romaine Closs

## 2023-09-24 NOTE — TOC Progression Note (Addendum)
 Transition of Care [facility]) - Progression Note    Patient Details  Name: Daniel Butler MRN: [id number] Date of Birth: [birth year]  Transition of Care [facility]) CM/SW Contact  Arron Big, [place] Phone Number: 09/24/2023, 10:19 AM  Clinical Narrative:   CSW left VM for [facility] admissions coordinator about pts referral for SNF.   12:50PM: CSW called admissions coordinator at [facility] regarding SNF referral. Admissions states that cost of room and board for 30 day stay will be $9,900 and price of therapy is $538 per week for therapy 3x a week ($898.20 for therapy 5x/week). CSW called [name] (pts wife) and informed her of prices for SNF (room and board and therapy being separate). [name] gave her verbal understanding and stated that pt needs rehab.   CSW informed [name] that RNCM stated does not qualify for [facility] but would be able to do Outpatient PT and pay out of pocket, if SNF is unable to accept pt at this time.   CSW awaiting call back from [facility] on decision on pt referral at this time.  2:00PM: [name] from [facility] met with pt and spouse in regard to SNF placement. [name] infromed CSW that she will meet with leadership team to discuss pt coming to their facility.   4:10PM: CSW spoke with [facility] and per Chantel they are accepting pt for SNF at this time. Chantel informed CSW that they are working on bed availability and to check back in tomorrow. CSW called [name], pt wife, and updated her on [facility] accepting pt at this time. CSW updated treatment team.   TOC will continue to follow.    Expected Discharge Plan: Home/Self Care Barriers to Discharge: Continued Medical Work up  Expected Discharge Plan and Services In-house Referral: NA Discharge Planning Services: CM Consult Post Acute Care Choice: NA Living arrangements for the past 2 months: Single Family Home Expected Discharge Date: 09/23/23               DME Arranged: N/A DME Agency: NA       HH Arranged:  NA           Social Determinants of Health (SDOH) Interventions SDOH Screenings   Food Insecurity: No Food Insecurity (09/18/2023)  Housing: Low Risk  (09/18/2023)  Transportation Needs: No Transportation Needs (09/18/2023)  Utilities: Not At Risk (09/18/2023)  Alcohol  Screen: Low Risk  (12/20/2022)  Depression (PHQ2-9): Low Risk  (12/20/2022)  Financial Resource Strain: High Risk (12/20/2022)  Physical Activity: Unknown (12/20/2022)  Social Connections: Socially Isolated (12/20/2022)  Stress: Stress Concern Present (12/20/2022)  Tobacco Use: High Risk (09/18/2023)    Readmission Risk Interventions    09/01/2023    4:24 PM  Readmission Risk Prevention Plan  Transportation Screening Complete  PCP or Specialist Appt within 5-7 Days Complete  Home Care Screening Complete  Medication Review (RN CM) Complete

## 2023-09-24 NOTE — Progress Notes (Signed)
 Mobility Specialist Progress Note:   09/24/23 0945  Mobility  Activity Transferred from bed to chair  Level of Assistance Moderate assist, patient does 50-74%  Assistive Device Front wheel walker  Distance Ambulated (ft) 5 ft  Activity Response Tolerated well  Mobility Referral Yes  Mobility visit 1 Mobility  Mobility Specialist Start Time (ACUTE ONLY) 0930  Mobility Specialist Stop Time (ACUTE ONLY) 0943  Mobility Specialist Time Calculation (min) (ACUTE ONLY) 13 min   Pt received in bed agreeable to mobility. Pt required MinA w/ bed mobility and ModA w/STS. Had mild dizziness throughout session. Was able to take a couple steps towards the chair w/o fault. Situated in chair w/ call bell and personal belongings in reach. All needs met. Wife in room.  Pre Mobility BP 114/64 During Mobility BP 94/81 Post Mobility right after transfer BP 101/75                       2 mins after transfer BP 112/73  Inetta Manes Mobility Specialist  Please contact vis Secure Chat or  Rehab Office [phone]

## 2023-09-25 ENCOUNTER — Inpatient Hospital Stay: Payer: Self-pay | Admitting: Family

## 2023-09-25 DIAGNOSIS — I5033 Acute on chronic diastolic (congestive) heart failure: Secondary | ICD-10-CM | POA: Diagnosis not present

## 2023-09-25 DIAGNOSIS — R531 Weakness: Secondary | ICD-10-CM | POA: Diagnosis not present

## 2023-09-25 DIAGNOSIS — Z7401 Bed confinement status: Secondary | ICD-10-CM | POA: Diagnosis not present

## 2023-09-25 MED ORDER — FUROSEMIDE 40 MG PO TABS: 40.0000 mg | ORAL_TABLET | Freq: Every day | ORAL | Status: DC

## 2023-09-25 MED ORDER — LORAZEPAM 0.5 MG PO TABS: 0.5000 mg | ORAL_TABLET | Freq: Four times a day (QID) | ORAL | Status: DC | PRN

## 2023-09-25 MED ORDER — ALPRAZOLAM 0.5 MG PO TABS: 0.5000 mg | ORAL_TABLET | Freq: Two times a day (BID) | ORAL | 0 refills | Status: DC | PRN

## 2023-09-25 MED ORDER — DILTIAZEM HCL ER COATED BEADS 240 MG PO CP24: 240.0000 mg | ORAL_CAPSULE | Freq: Every day | ORAL | Status: DC

## 2023-09-25 NOTE — Discharge Summary (Signed)
Physician Discharge Summary  Daniel Butler:[id number] DOB: [birth year] DOA: 09/18/2023  PCP: Olive Bass, FNP  Admit date: 09/18/2023 Discharge date: 09/25/2023  Time spent: 45 minutes  Recommendations for Outpatient Follow-up:  [facility] heart care on 2/14   Discharge Diagnoses:  Principal Problem:   Acute on chronic diastolic CHF (congestive heart failure) (HCC)   Persistent atrial fibrillation (HCC)   Alcohol use disorder, severe, dependence (HCC) Orthostatic hypotension Suspected autonomic neuropathy   Chronic kidney disease, stage 3a (HCC)   Anemia of chronic disease   Chronic ulcer of left foot (HCC) History of subarachnoid hemorrhage   Discharge Condition: Improved  Diet recommendation: [place] DM, heart healthy  Filed Weights   09/22/23 0450 09/23/23 0415 09/25/23 0728  Weight: 113.9 kg 108.6 kg 107.9 kg    History of present illness:  58 y.o. 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 Recent hospitalization for symptomatic anemia 12/22 to 09/03/23, required 4 units PRBC transfusion. At home he remained very weak and deconditioned, poor ambulation. He stopped his furosemide, due to difficulty moving to the bathroom. He developed progressive weakness and lower extremity edema, prompting EMS call. He was found with 02 saturation 88% was placed on supplemental 02 per [place] 2 L/min, positive lower extremity edema +++ (pitting)  Na 140 K 3,7 Cl 102, bicarbonate 24, glucose 93 bun 12 cr 1,57  BNP 217 Chest radiograph with cardiomegaly, with bilateral hilar vascular congestion with no effusions or infiltrates.     Hospital Course:   Acute on chronic diastolic CHF (congestive heart failure) (HCC) -Echocardiogram with preserved LV systolic function with  EF 60 to 65%, mild LVH, RV systolic function preserved, -Diuresed with IV Lasix, volume status has improved, 14 L negative -Now euvolemic, transitioned to oral diuretics, Lasix, metoprolol and Jardiance -GDMT limited by orthostatic hypotension -Chronically ill weak and deconditioned, he will discharge to SNF for short-term rehab  Heavy EtOH abuse with dependence -Counseled, surprisingly did not have significant withdrawal symptoms this admission, did require as needed Ativan this admission, transitioned back to home regimen of Xanax  Orthostatic hypotension -Suspect he has autonomic neuropathy from longstanding alcoholism -Cardiac meds titrated down, this limits GDMT  Persistent atrial fibrillation (HCC) Continue rate control with metoprolol and diltiazem, Cardizem dose increased on account of persistent RVR Anticoagulation with apixaban.   Chronic kidney disease, stage 3a (HCC) AKI, Hypokalemia.  Improved and stable   Anemia of chronic disease Stable cell count.    Chronic ulcer of left foot (HCC) Continue with local wound care -Follow-up with the wound clinic and vascular   Discharge Exam: Vitals:   09/25/23 0832 09/25/23 1150  BP: 100/63 115/71  Pulse: 66 95  Resp:  18  Temp:  98.3 F (36.8 C)  SpO2:  93%   Gen: Awake, Alert, Oriented X 3,  HEENT: no JVD Lungs: Good air movement bilaterally, CTAB CVS: S1S2/irregular Abd: soft, Non tender, non distended, BS present Extremities: No edema Skin: no new rashes on exposed skin  Discharge Instructions   Discharge Instructions     Diet - low sodium heart healthy   Complete by: As directed    Discharge instructions   Complete by: As directed    Please follow up with primary care in 7 to 10 days.   Discharge wound care:  Complete by: As directed    : Right foot: cover with foam dressing, change every 3 days. Left Foot: apply xeroform (change daily) on the wound bed, cover with foam dressing, change every 3 days.    Increase activity slowly   Complete by: As directed       Allergies as of 09/25/2023       Reactions   Zetia [ezetimibe] Nausea And Vomiting        Medication List     STOP taking these medications    diltiazem 30 MG tablet Commonly known as: CARDIZEM       TAKE these medications    acetaminophen 325 MG tablet Commonly known as: TYLENOL Take 2 tablets (650 mg total) by mouth every 6 (six) hours as needed for mild pain (pain score 1-3) or moderate pain (pain score 4-6).   allopurinol 100 MG tablet Commonly known as: ZYLOPRIM Take 1 tablet (100 mg total) by mouth daily.   ALPRAZolam 0.5 MG tablet Commonly known as: XANAX Take 1 tablet (0.5 mg total) by mouth 2 (two) times daily as needed for anxiety. What changed: See the new instructions.   apixaban 5 MG Tabs tablet Commonly known as: ELIQUIS Take 1 tablet (5 mg total) by mouth 2 (two) times daily.   diltiazem 240 MG 24 hr capsule Commonly known as: CARDIZEM CD Take 1 capsule (240 mg total) by mouth daily. Start taking on: September 26, 2023 What changed:  medication strength how much to take   empagliflozin 10 MG Tabs tablet Commonly known as: JARDIANCE Take 1 tablet (10 mg total) by mouth daily.   fenofibrate 145 MG tablet Commonly known as: TRICOR Take 1 tablet by mouth once daily   folic acid 1 MG tablet Commonly known as: FOLVITE Take 1 tablet (1 mg total) by mouth daily.   furosemide 40 MG tablet Commonly known as: LASIX Take 1 tablet by mouth once daily   icosapent Ethyl 1 g capsule Commonly known as: Vascepa Take 2 capsules (2 g total) by mouth 2 (two) times daily.   metoprolol succinate 50 MG 24 hr tablet Commonly known as: TOPROL-XL Take 1 tablet (50 mg total) by mouth daily. Take with or immediately following a meal.   multivitamin with minerals Tabs tablet Take 1 tablet by mouth daily.   pantoprazole 40 MG tablet Commonly known as: PROTONIX Take 1 tablet (40 mg total) by mouth  2 (two) times daily.   polyethylene glycol 17 g packet Commonly known as: MIRALAX / GLYCOLAX Take 17 g by mouth 2 (two) times daily.   rosuvastatin 40 MG tablet Commonly known as: CRESTOR Take 1 tablet (40 mg total) by mouth daily.   thiamine 100 MG tablet Commonly known as: Vitamin B-1 Take 100 mg by mouth daily.   traZODone 50 MG tablet Commonly known as: DESYREL Take 0.5-1 tablets (25-50 mg total) by mouth at bedtime as needed for sleep. What changed: how much to take               Discharge Care Instructions  (From admission, onward)           Start     Ordered   09/23/23 0000  Discharge wound care:       Comments: : Right foot: cover with foam dressing, change every 3 days. Left Foot: apply xeroform (change daily) on the wound bed, cover with foam dressing, change every 3 days.   09/23/23 1137  Allergies  Allergen Reactions   Zetia [Ezetimibe] Nausea And Vomiting    Contact information for follow-up providers     Olive Bass, FNP. Go on 09/25/2023.   Specialty: Internal Medicine Why: @10 :40am please arrive 15 minutes early Contact information: [address] [place] [address] [phone]              Contact information for after-discharge care     Destination     [facility] Preferred SNF .   Service: Skilled Nursing Contact information: [address] [place] [address] [phone]                      The results of significant diagnostics from this hospitalization (including imaging, microbiology, ancillary and laboratory) are listed below for reference.    Significant Diagnostic Studies: ECHOCARDIOGRAM COMPLETE Result Date: 09/19/2023    ECHOCARDIOGRAM REPORT   Patient Name:   TYRIKE GREULICH Date of Exam: 09/19/2023 Medical Rec #:  [id number]           Height:       73.0 in Accession #:    [id number]          Weight:       252.2 lb Date of  Birth:  04/23/1966           BSA:          2.375 m Patient Age:    57 years            BP:           146/62 mmHg Patient Gender: M                   HR:           130 bpm. Exam Location:  Inpatient Procedure: 2D Echo, Cardiac Doppler, Color Doppler and Intracardiac            Opacification Agent Indications:    CHF- Acute Diastolic  History:        Patient has prior history of Echocardiogram examinations, most                 recent 01/06/2023. Risk Factors:Hypertension and Current Smoker.  Sonographer:    Karma Ganja Referring Phys: [id number] VISHAL R PATEL  Sonographer Comments: Technically challenging study due to limited acoustic windows. Image acquisition challenging due to patient body habitus. IMPRESSIONS  1. Left ventricular ejection fraction, by estimation, is 60 to 65%. The left ventricle has normal function. The left ventricle has no regional wall motion abnormalities. There is mild concentric left ventricular hypertrophy. Left ventricular diastolic parameters are indeterminate.  2. Right ventricular systolic function is normal. The right ventricular size is normal. Moderately increased right ventricular wall thickness.  3. Left atrial size was moderately dilated.  4. Right atrial size was moderately dilated.  5. There appears to be a prominent anterior fat pad but cannot exclude complex effusion. a small pericardial effusion is present. The pericardial effusion is posterior to the left ventricle.  6. The mitral valve is normal in structure. No evidence of mitral valve regurgitation. No evidence of mitral stenosis.  7. The aortic valve is normal in structure. Aortic valve regurgitation is not visualized. No aortic stenosis is present.  8. Aortic dilatation noted. There is borderline dilatation of the aortic root, measuring 38 mm.  9. The inferior vena cava is dilated in size with <50% respiratory variability, suggesting right atrial pressure of 15 mmHg.  Conclusion(s)/Recommendation(s): Very diffifcult study  due to poor sound mave transmission. There is biventricular hypertrophy and there appears to be a prominent anterior pericardial fat pad but cannot exclude complex effusion. Would consider cardiac MRI to further evalaute if clinically indicated. FINDINGS  Left Ventricle: Left ventricular ejection fraction, by estimation, is 60 to 65%. The left ventricle has normal function. The left ventricle has no regional wall motion abnormalities. Definity contrast agent was given IV to delineate the left ventricular  endocardial borders. The left ventricular internal cavity size was normal in size. There is mild concentric left ventricular hypertrophy. Left ventricular diastolic parameters are indeterminate. Right Ventricle: The right ventricular size is normal. Moderately increased right ventricular wall thickness. Right ventricular systolic function is normal. Left Atrium: Left atrial size was moderately dilated. Right Atrium: Right atrial size was moderately dilated. Pericardium: There appears to be a prominent anterior fat pad but cannot exclude complex effusion. A small pericardial effusion is present. The pericardial effusion is posterior to the left ventricle. Mitral Valve: The mitral valve is normal in structure. No evidence of mitral valve regurgitation. No evidence of mitral valve stenosis. Tricuspid Valve: The tricuspid valve is normal in structure. Tricuspid valve regurgitation is not demonstrated. No evidence of tricuspid stenosis. Aortic Valve: The aortic valve is normal in structure. Aortic valve regurgitation is not visualized. No aortic stenosis is present. Aortic valve mean gradient measures 1.0 mmHg. Aortic valve peak gradient measures 2.8 mmHg. Aortic valve area, by VTI measures 3.09 cm. Pulmonic Valve: The pulmonic valve was not well visualized. Pulmonic valve regurgitation is not visualized. No evidence of pulmonic stenosis. Aorta: The aortic root is normal in size and structure and aortic dilatation  noted. There is borderline dilatation of the aortic root, measuring 38 mm. Venous: The inferior vena cava is dilated in size with less than 50% respiratory variability, suggesting right atrial pressure of 15 mmHg. IAS/Shunts: No atrial level shunt detected by color flow Doppler.  LEFT VENTRICLE PLAX 2D LVIDd:         5.00 cm   Diastology LVIDs:         4.00 cm   LV e' medial:    9.28 cm/s LV PW:         1.60 cm   LV E/e' medial:  12.6 LV IVS:        1.30 cm   LV e' lateral:   11.47 cm/s LVOT diam:     2.30 cm   LV E/e' lateral: 10.2 LV SV:         39 LV SV Index:   16 LVOT Area:     4.15 cm  RIGHT VENTRICLE          IVC RV Basal diam:  3.80 cm  IVC diam: 3.60 cm TAPSE (M-mode): 2.8 cm LEFT ATRIUM              Index        RIGHT ATRIUM           Index LA diam:        4.60 cm  1.94 cm/m   RA Area:     23.80 cm LA Vol (A2C):   117.0 ml 49.27 ml/m  RA Volume:   78.10 ml  32.89 ml/m LA Vol (A4C):   103.0 ml 43.38 ml/m LA Biplane Vol: 114.0 ml 48.01 ml/m  AORTIC VALVE AV Area (Vmax):    3.68 cm AV Area (Vmean):   3.71 cm AV Area (VTI):  3.09 cm AV Vmax:           83.67 cm/s AV Vmean:          54.233 cm/s AV VTI:            0.126 m AV Peak Grad:      2.8 mmHg AV Mean Grad:      1.0 mmHg LVOT Vmax:         74.10 cm/s LVOT Vmean:        48.433 cm/s LVOT VTI:          0.094 m LVOT/AV VTI ratio: 0.74  AORTA Ao Root diam: 3.80 cm Ao Asc diam:  3.00 cm MITRAL VALVE MV Area (PHT): 6.27 cm     SHUNTS MV Decel Time: 121 msec     Systemic VTI:  0.09 m MV E velocity: 117.00 cm/s  Systemic Diam: 2.30 cm Arvilla Meres MD Electronically signed by Arvilla Meres MD Signature Date/Time: 09/19/2023/9:19:17 AM    Final    Abd 1 View (KUB) Result Date: 09/18/2023 CLINICAL DATA:  Constipation EXAM: ABDOMEN - 1 VIEW COMPARISON:  12/15/2020 FINDINGS: Nonobstructed bowel-gas pattern with moderate stool burden. Clips in the pelvis. IMPRESSION: Nonobstructed bowel-gas pattern with moderate stool burden. Electronically Signed    By: Jasmine Pang M.D.   On: 09/18/2023 15:21   DG Chest Portable 1 View Result Date: 09/18/2023 CLINICAL DATA:  Generalized weak in with bilateral extremity edema. EXAM: PORTABLE CHEST 1 VIEW COMPARISON:  Radiographs 01/02/2021 and 12/21/2020.  CT 08/31/2023. FINDINGS: 1037 hours. The heart is moderately enlarged, but stable. The lungs appear clear. There is no pleural effusion or pneumothorax. The bones appear unremarkable. IMPRESSION: Stable cardiomegaly. No evidence of acute cardiopulmonary process. Electronically Signed   By: Carey Bullocks M.D.   On: 09/18/2023 10:44   CT CHEST ABDOMEN PELVIS W CONTRAST Result Date: 08/31/2023 CLINICAL DATA:  Poly trauma. Status post fall striking head on sink. On anticoagulation therapy. EXAM: CT CHEST, ABDOMEN, AND PELVIS WITH CONTRAST TECHNIQUE: Multidetector CT imaging of the chest, abdomen and pelvis was performed following the standard protocol during bolus administration of intravenous contrast. RADIATION DOSE REDUCTION: This exam was performed according to the departmental dose-optimization program which includes automated exposure control, adjustment of the mA and/or kV according to patient size and/or use of iterative reconstruction technique. CONTRAST:  75mL OMNIPAQUE IOHEXOL 350 MG/ML SOLN COMPARISON:  12/15/2020 FINDINGS: CT CHEST FINDINGS Cardiovascular: Heart size is upper limits of normal. There is no pericardial effusion. Mild aortic atherosclerosis. Lad coronary artery calcifications. Mediastinum/Nodes: No enlarged mediastinal, hilar, or axillary lymph nodes. Thyroid gland, trachea, and esophagus demonstrate no significant findings. Lungs/Pleura: There is a small right pleural effusion, image 52/3. There is ground-glass attenuation and subpleural consolidation within the right lower lobe, image 138/7. No pneumothorax identified. Stable left upper lobe lung nodule measuring 4 mm, image 51/7. Musculoskeletal: No chest wall mass or suspicious bone lesions  identified. No fractures. CT ABDOMEN PELVIS FINDINGS Hepatobiliary: No hepatic injury or perihepatic hematoma. Gallbladder is unremarkable. Pancreas: Unremarkable. No pancreatic ductal dilatation or surrounding inflammatory changes. Spleen: No splenic injury or perisplenic hematoma. Adrenals/Urinary Tract: No adrenal hemorrhage or renal injury identified. Bladder is unremarkable. Upper pole left kidney cyst measures 10 mm and 21 Hounsfield units compatible with a benign Bosniak class 2 cyst. No follow-up imaging recommended. Stomach/Bowel: Stomach is within normal limits. Appendix appears normal. No evidence of bowel wall thickening, distention, or inflammatory changes. Vascular/Lymphatic: Aortic atherosclerosis. No enlarged abdominal or pelvic lymph nodes. Reproductive:  Mild prostate gland enlargement Other: No free fluid or fluid collections. Fat containing umbilical hernia. Musculoskeletal: Areas of subcutaneous soft tissue stranding are noted along bilateral flanks and overlying both hips. No acute or suspicious osseous findings. L4-5 degenerative disc disease. IMPRESSION: 1. Small right pleural effusion. 2. Ground-glass attenuation and subpleural consolidation within the right lower lobe. Differential considerations include pneumonia, aspiration, or in the acute posttraumatic setting pulmonary contusion. No associated overlying rib fractures identified. 3. Coronary artery calcifications. 4. Stable 4 mm left upper lobe lung nodule. No follow-up imaging recommended. 5. Areas of subcutaneous soft tissue stranding are noted along bilateral flanks and overlying both hips. Findings may reflect anasarca or areas of superficial contusion. Clinical correlation advised. 6.  Aortic Atherosclerosis (ICD10-I70.0). Electronically Signed   By: Signa Kell M.D.   On: 08/31/2023 07:07   CT HEAD WO CONTRAST ( ) Result Date: 08/31/2023 CLINICAL DATA:  Blunt poly trauma.  Head trauma on Eliquis. EXAM: CT HEAD WITHOUT  CONTRAST CT CERVICAL SPINE WITHOUT CONTRAST TECHNIQUE: Multidetector CT imaging of the head and cervical spine was performed following the standard protocol without intravenous contrast. Multiplanar CT image reconstructions of the cervical spine were also generated. RADIATION DOSE REDUCTION: This exam was performed according to the departmental dose-optimization program which includes automated exposure control, adjustment of the mA and/or kV according to patient size and/or use of iterative reconstruction technique. COMPARISON:  12/15/2020 FINDINGS: CT HEAD FINDINGS Brain: No evidence of acute infarction, hemorrhage, hydrocephalus, extra-axial collection or mass lesion/mass effect. 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 Encephalomalacia in the right frontal lobe beneath a burr hole for presumed ventricular drain. No new finding Vascular: Right-sided aneurysm clip as noted above. Atheromatous calcification. Skull: Unremarkable right pterional craniotomy Sinuses/Orbits: No evidence of injury CT CERVICAL SPINE FINDINGS Alignment: Normal. Skull base and vertebrae: No acute fracture. No primary bone lesion or focal pathologic process. Soft tissues and spinal canal: No prevertebral fluid or swelling. No visible canal hematoma. Disc levels:  No significant degeneration. Upper chest: Reported separately IMPRESSION: No evidence of acute intracranial or cervical spine injury. Electronically Signed   By: Tiburcio Pea M.D.   On: 08/31/2023 06:55   CT Cervical Spine Wo Contrast Result Date: 08/31/2023 CLINICAL DATA:  Blunt poly trauma.  Head trauma on Eliquis. EXAM: CT HEAD WITHOUT CONTRAST CT CERVICAL SPINE WITHOUT CONTRAST TECHNIQUE: Multidetector CT imaging of the head and cervical spine was performed following the standard protocol without intravenous contrast. Multiplanar CT image reconstructions of the cervical spine were also generated. RADIATION DOSE REDUCTION: This exam was  performed according to the departmental dose-optimization program which includes automated exposure control, adjustment of the mA and/or kV according to patient size and/or use of iterative reconstruction technique. COMPARISON:  12/15/2020 FINDINGS: CT HEAD FINDINGS Brain: No evidence of acute infarction, hemorrhage, hydrocephalus, extra-axial collection or mass lesion/mass effect. 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 Encephalomalacia in the right frontal lobe beneath a burr hole for presumed ventricular drain. No new finding Vascular: Right-sided aneurysm clip as noted above. Atheromatous calcification. Skull: Unremarkable right pterional craniotomy Sinuses/Orbits: No evidence of injury CT CERVICAL SPINE FINDINGS Alignment: Normal. Skull base and vertebrae: No acute fracture. No primary bone lesion or focal pathologic process. Soft tissues and spinal canal: No prevertebral fluid or swelling. No visible canal hematoma. Disc levels:  No significant degeneration. Upper chest: Reported separately IMPRESSION: No evidence of acute intracranial or cervical spine injury. Electronically Signed   By: Audry Riles.D.  On: 08/31/2023 06:55    Microbiology: No results found for this or any previous visit (from the past 240 hours).   Labs: Basic Metabolic Panel: Recent Labs  Lab 09/19/23 0247 09/20/23 0224 09/21/23 0235 09/22/23 0235 09/23/23 0232  NA 140 137 135 133* 134*  K 3.3* 3.2* 3.7 3.2* 3.7  CL 100 102 103 101 102  CO2 27 26 24 23 24   GLUCOSE 106* 101* 100* 100* 104*  BUN 10 14 15 14 16   CREATININE 1.57* 1.85* 1.89* 1.79* 1.71*  CALCIUM 8.3* 7.9* 8.0* 8.0* 8.1*  MG 1.6* 2.4 2.1  --   --    Liver Function Tests: No results for input(s): "AST", "ALT", "ALKPHOS", "BILITOT", "PROT", "ALBUMIN" in the last 168 hours. No results for input(s): "LIPASE", "AMYLASE" in the last 168 hours. No results for input(s): "AMMONIA" in the last 168 hours. CBC: Recent Labs   Lab 09/19/23 0247 09/21/23 0235  WBC 10.0 9.0  HGB 10.5* 8.9*  HCT 34.3* 29.8*  MCV 79.0* 82.3  PLT 226 170   Cardiac Enzymes: No results for input(s): "CKTOTAL", "CKMB", "CKMBINDEX", "TROPONINI" in the last 168 hours. BNP: BNP (last 3 results) Recent Labs    08/31/23 0504 09/18/23 1052  BNP 200.6* 217.9*    ProBNP (last 3 results) No results for input(s): "PROBNP" in the last 8760 hours.  CBG: Recent Labs  Lab 09/19/23 0608  GLUCAP 108*       Signed:  Zannie Cove MD.  [facility] 09/25/2023, 12:02 PM

## 2023-09-25 NOTE — TOC Transition Note (Signed)
Transition of Care [facility]) - Discharge Note   Patient Details  Name: Daniel Butler MRN: [id number] Date of Birth: 1966-06-25  Transition of Care [facility]) CM/SW Contact:  Michaela Corner, LCSWA Phone Number: 09/25/2023, 11:14 AM   Clinical Narrative:   Patient will DC to: [facility]  Anticipated DC date: 09/25/2023 Family notified: [name] (spouse) Transport by: Sharin Mons   Per MD patient ready for DC to [facility]. RN to call report prior to discharge [phone]; room 213). RN, patient, patient's family, and facility notified of DC. Discharge Summary and FL2 sent to facility. DC packet on chart. Ambulance transport requested for patient.   CSW will sign off for now as social work intervention is no longer needed. Please consult [place] again if new needs arise.      Final next level of care: Skilled Nursing Facility Barriers to Discharge: Barriers Resolved   Patient Goals and CMS Choice Patient states their goals for this hospitalization and ongoing recovery are:: return home   Choice offered to / list presented to : NA      Discharge Placement              Patient chooses bed at: [facility] Patient to be transferred to facility by: [facility] Name of family member notified: [name] - spouse Patient and family notified of of transfer: 09/25/23  Discharge Plan and Services Additional resources added to the After Visit Summary for   In-house Referral: NA Discharge Planning Services: CM Consult Post Acute Care Choice: NA          DME Arranged: N/A DME Agency: NA       HH Arranged: NA          Social Drivers of Health (SDOH) Interventions SDOH Screenings   Food Insecurity: No Food Insecurity (09/18/2023)  Housing: Low Risk  (09/18/2023)  Transportation Needs: No Transportation Needs (09/18/2023)  Utilities: Not At Risk (09/18/2023)  Alcohol Screen: Low Risk  (12/20/2022)  Depression (PHQ2-9): Low Risk  (12/20/2022)  Financial Resource Strain:  High Risk (12/20/2022)  Physical Activity: Unknown (12/20/2022)  Social Connections: Socially Isolated (12/20/2022)  Stress: Stress Concern Present (12/20/2022)  Tobacco Use: High Risk (09/18/2023)     Readmission Risk Interventions    09/01/2023    4:24 PM  Readmission Risk Prevention Plan  Transportation Screening Complete  PCP or Specialist Appt within 5-7 Days Complete  Home Care Screening Complete  Medication Review (RN CM) Complete

## 2023-09-29 DIAGNOSIS — I509 Heart failure, unspecified: Secondary | ICD-10-CM | POA: Diagnosis not present

## 2023-10-01 DIAGNOSIS — E611 Iron deficiency: Secondary | ICD-10-CM | POA: Diagnosis not present

## 2023-10-06 DIAGNOSIS — E790 Hyperuricemia without signs of inflammatory arthritis and tophaceous disease: Secondary | ICD-10-CM | POA: Diagnosis not present

## 2023-10-09 ENCOUNTER — Other Ambulatory Visit: Payer: Self-pay | Admitting: Family

## 2023-10-09 DIAGNOSIS — F411 Generalized anxiety disorder: Secondary | ICD-10-CM

## 2023-10-09 NOTE — Telephone Encounter (Signed)
Copied from CRM [id number]. Topic: Clinical - Medication Refill >> Oct 09, 2023 12:27 PM Orinda Kenner C wrote: Most Recent Primary Care Visit:  Provider: Ria Clock [facility]  Department: [facility]  Visit Type: OFFICE VISIT  Date: 12/20/2022  Medication: ALPRAZolam (XANAX) 0.5 MG [facility] [address], [place] - [address] [facility] AT [facility] [address] [place] [address] [phone]  TraZODone (DESYREL) 50 MG tablet #90 sent to [facility] [address] [place], [place] -[address] [place] [address] Phone:[phone]:[phone]  Has the patient contacted their pharmacy? Yes (Agent: If no, request that the patient contact the pharmacy for the refill. If patient does not wish to contact the pharmacy document the reason why and proceed with request.) (Agent: If yes, when and what did the pharmacy advise?)  Is this the correct pharmacy for this prescription? Yes If no, delete pharmacy and type the correct one.  This is the patient's preferred pharmacy:  [facility] [address] [place], [place] - [address] [place] [address] Phone: [phone] Fax: [phone]  [id number] [place], [place] - [address] AT [facility] [address] [place] [address] Phone: [phone] Fax: [phone]  Has the prescription been filled recently? No  Is the patient out of the medication? Yes  Has the patient been seen for an appointment in the last year OR does the patient have an upcoming appointment? Yes  Can we respond through MyChart? No. Patient states the pharmacy denied medications and needs to be seen in the office. Patient is in a [facility] now and for another 2 weeks and cannot come into the office and will run out of medications traZODone (DESYREL) 50 MG tablet and ALPRAZolam (XANAX) 0.5 MG tablet. Patient states the  rehab is scary and needs Alprazolam to calm him down. Please call patient back [phone] to help out with the situation, patient is out of medications.  Agent: Please be advised that Rx refills may take up to 3 business days. We ask that you follow-up with your pharmacy.

## 2023-10-10 NOTE — Telephone Encounter (Signed)
Pt called to check status of refills- he is currently in skilled rehab since leaving hospital. Informed him that the skilled rehab provider should be refilling meds until he leaves facility.

## 2023-10-11 DIAGNOSIS — F411 Generalized anxiety disorder: Secondary | ICD-10-CM | POA: Diagnosis not present

## 2023-10-11 DIAGNOSIS — I5022 Chronic systolic (congestive) heart failure: Secondary | ICD-10-CM | POA: Diagnosis not present

## 2023-10-11 DIAGNOSIS — E782 Mixed hyperlipidemia: Secondary | ICD-10-CM | POA: Diagnosis not present

## 2023-10-11 DIAGNOSIS — K5901 Slow transit constipation: Secondary | ICD-10-CM | POA: Diagnosis not present

## 2023-10-11 DIAGNOSIS — R41841 Cognitive communication deficit: Secondary | ICD-10-CM | POA: Diagnosis not present

## 2023-10-11 DIAGNOSIS — N1831 Chronic kidney disease, stage 3a: Secondary | ICD-10-CM | POA: Diagnosis not present

## 2023-10-11 DIAGNOSIS — R293 Abnormal posture: Secondary | ICD-10-CM | POA: Diagnosis not present

## 2023-10-11 DIAGNOSIS — M6281 Muscle weakness (generalized): Secondary | ICD-10-CM | POA: Diagnosis not present

## 2023-10-11 DIAGNOSIS — R1319 Other dysphagia: Secondary | ICD-10-CM | POA: Diagnosis not present

## 2023-10-11 DIAGNOSIS — M6259 Muscle wasting and atrophy, not elsewhere classified, multiple sites: Secondary | ICD-10-CM | POA: Diagnosis not present

## 2023-10-11 DIAGNOSIS — G4709 Other insomnia: Secondary | ICD-10-CM | POA: Diagnosis not present

## 2023-10-11 DIAGNOSIS — I480 Paroxysmal atrial fibrillation: Secondary | ICD-10-CM | POA: Diagnosis not present

## 2023-10-11 DIAGNOSIS — Z9889 Other specified postprocedural states: Secondary | ICD-10-CM | POA: Diagnosis not present

## 2023-10-11 DIAGNOSIS — D638 Anemia in other chronic diseases classified elsewhere: Secondary | ICD-10-CM | POA: Diagnosis not present

## 2023-10-11 DIAGNOSIS — I10 Essential (primary) hypertension: Secondary | ICD-10-CM | POA: Diagnosis not present

## 2023-10-11 DIAGNOSIS — Z720 Tobacco use: Secondary | ICD-10-CM | POA: Diagnosis not present

## 2023-10-11 DIAGNOSIS — K219 Gastro-esophageal reflux disease without esophagitis: Secondary | ICD-10-CM | POA: Diagnosis not present

## 2023-10-11 DIAGNOSIS — R2689 Other abnormalities of gait and mobility: Secondary | ICD-10-CM | POA: Diagnosis not present

## 2023-10-13 ENCOUNTER — Other Ambulatory Visit: Payer: Self-pay | Admitting: Family

## 2023-10-13 DIAGNOSIS — F411 Generalized anxiety disorder: Secondary | ICD-10-CM

## 2023-10-15 ENCOUNTER — Other Ambulatory Visit: Payer: Self-pay | Admitting: Family

## 2023-10-15 DIAGNOSIS — E785 Hyperlipidemia, unspecified: Secondary | ICD-10-CM

## 2023-10-17 ENCOUNTER — Other Ambulatory Visit: Payer: Self-pay | Admitting: Family

## 2023-10-17 DIAGNOSIS — E785 Hyperlipidemia, unspecified: Secondary | ICD-10-CM

## 2023-10-17 DIAGNOSIS — E7800 Pure hypercholesterolemia, unspecified: Secondary | ICD-10-CM | POA: Diagnosis not present

## 2023-10-22 NOTE — Progress Notes (Deleted)
 Cardiology Clinic Note   Patient Name: Daniel Butler Date of Encounter: 10/22/2023  Primary Care Provider:  Olive Bass, FNP Primary Cardiologist:  Chrystie Nose, MD  Patient Profile    Daniel Butler 58 year old male presents to the clinic today for follow-up evaluation of his atrial fibrillation.    Past Medical History    Past Medical History:  Diagnosis Date   Brain aneurysm    Chicken pox    Depression    Elevated LFTs    Gout    Hernia of abdominal cavity 11/2020   High cholesterol    Hypertension    Past Surgical History:  Procedure Laterality Date   BIOPSY  09/02/2023   Procedure: BIOPSY;  Surgeon: Jenel Lucks, MD;  Location: [facility] ENDOSCOPY;  Service: Gastroenterology;;   CARDIOVERSION N/A 01/22/2021   Procedure: CARDIOVERSION;  Surgeon: Meriam Sprague, MD;  Location: [facility] ENDOSCOPY;  Service: Cardiovascular;  Laterality: N/A;   COLONOSCOPY N/A 09/02/2023   Procedure: COLONOSCOPY;  Surgeon: Jenel Lucks, MD;  Location: [facility] ENDOSCOPY;  Service: Gastroenterology;  Laterality: N/A;   CRANIOTOMY Right 03/02/2015   Procedure: CRANIOTOMY INTRACRANIAL  ANEURYSM FOR CLIPPING;  Surgeon: Lisbeth Renshaw, MD;  Location: [facility] NEURO ORS;  Service: Neurosurgery;  Laterality: Right;   ESOPHAGOGASTRODUODENOSCOPY N/A 09/02/2023   Procedure: ESOPHAGOGASTRODUODENOSCOPY (EGD);  Surgeon: Jenel Lucks, MD;  Location: [facility] ENDOSCOPY;  Service: Gastroenterology;  Laterality: N/A;   HEMOSTASIS CLIP PLACEMENT  09/02/2023   Procedure: HEMOSTASIS CLIP PLACEMENT;  Surgeon: Jenel Lucks, MD;  Location: [facility] ENDOSCOPY;  Service: Gastroenterology;;   IR GENERIC HISTORICAL  02/16/2016   IR ANGIO VERTEBRAL SEL VERTEBRAL BILAT MOD SED 02/16/2016 Lisbeth Renshaw, MD [facility]-INTER[facility] RAD   IR GENERIC HISTORICAL  02/16/2016   IR ANGIO INTRA EXTRACRAN SEL INTERNAL CAROTID BILAT MOD SED 02/16/2016 Lisbeth Renshaw, MD [facility]   POLYPECTOMY  09/02/2023    Procedure: POLYPECTOMY;  Surgeon: Jenel Lucks, MD;  Location: [facility] ENDOSCOPY;  Service: Gastroenterology;;   RADIOLOGY WITH ANESTHESIA N/A 03/02/2015   Procedure: RADIOLOGY WITH ANESTHESIA;  Surgeon: Lisbeth Renshaw, MD;  [facility] OR;  Service: Radiology;  Laterality: N/A;   RIGHT/LEFT HEART CATH AND CORONARY ANGIOGRAPHY N/A 12/19/2020   Procedure: RIGHT/LEFT HEART CATH AND CORONARY ANGIOGRAPHY;  Surgeon: [place], Peter M, MD;  Location: [facility] INVASIVE CV LAB;  Service: Cardiovascular;  Laterality: N/A;   WISDOM TOOTH EXTRACTION      Allergies  Allergies  Allergen Reactions   Zetia [Ezetimibe] Nausea And Vomiting    History of Present Illness    Daniel Butler has a PMH of hyperlipidemia, hypertension, brain aneurysm, tobacco use, EtOH use, anxiety, depression, acute combined systolic and diastolic CHF, and atrial fibrillation with RVR.   He was admitted to the hospital on 12/15/2020 and discharged on 12/23/2020.  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  He underwent cardiac catheterization on 12/19/2020 which showed normal coronary arteries.  He was started on Entresto 24-26, spironolactone was not initiated due to hyperkalemia.   He presented to the clinic 01/15/2021 for follow-up evaluation stated he felt fairly well.  He noted that he previously had dizziness which he felt was slightly worse.  It had been chronic for the past 2 years.  He had been monitoring his diet and reported compliance with his apixaban medication.  He had been compliant with his no lifting over 30 pounds due to his  brain aneurysm.  He tried to work part-time but was unable to complete work due to fatigue and lack of fitness.  He had been fairly sedentary at home.  His EKG  showed atrial fibrillation.  It was reviewed with DOD and he was scheduled for DCCV.  Blood pressure was 92/64.  He denied shortness  of breath, chest pain, lower extremity edema, orthopnea and PND.  He was instructed to continue to abstain from EtOH.  He presented to the hospital 01/22/2021 for DCCV for cardioversion.  He underwent cardioversion x3 which was unsuccessful.  He presented to the clinic 02/07/2021 for follow-up evaluation stated he felt well.  We reviewed his unsuccessful cardioversion attempts.  He reported he had been compliant with his apixaban medication.  He denied bleeding issues.  He denied shortness of breath and activity intolerance.  His heart rate was 65.  We discussed options for treatment including referral to atrial fibrillation clinic, continued medical therapy, and repeat cardioversion.  He wished to continue medical therapy at the time.  He reported that he was in the process of filling out patient assistance paperwork for help with his medications.  We previously discussed that it would be difficult to provide due to his previous [employer].  His medications were continued, we provided apixaban samples, and planned follow-up with [name] in 6 months.  He presented to the clinic 10/22/2021 for follow-up evaluation and stated he felt that he had less energy.  He reported that he had been fairly sedentary and did not exercise.  He continued to drink about 4 beers per day.  He reported that he also enjoyed fruit juice.  He did note some dizziness with increased physical activity.  His blood pressure was 102/62.  His pulse was 56 bpm.  EKG showed atrial fibrillation with RVR 110 bpm.  With checking his pulse at home he noted it was routinely in the high 50s low 60s.  I  asked him to avoid triggers for atrial fibrillation, planned repeat echocardiogram, asked him to increase his physical activity, and planned follow-up with [name] in 3-4 months.  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  He  presents to the clinic today for follow-up evaluation states he has noticed increased dizziness since being on [place].  He presented to his PCP which discontinued [place].  She is monitoring his kidney function.  We will stop Entresto and start valsartan 20 mg daily.  We will not restart Farxiga at this time.  We reviewed the importance of continuing to take Eliquis.  He and his wife expressed understanding.  We again reviewed the importance of stopping EtOH.  I will have him increase his physical activity as tolerated, plan BMP in 2 weeks, and plan follow-up for 1 to 2 months with [name].  Today he denies chest pain, increased shortness of breath, lower extremity edema, fatigue, palpitations, melena, hematuria, hemoptysis, diaphoresis, weakness, presyncope, syncope, orthopnea, and PND.   Home Medications    Prior to Admission medications   Medication Sig Start Date End Date Taking? Authorizing Provider  allopurinol (ZYLOPRIM) 100 MG tablet Take 1 tablet (100 mg total) by mouth daily. 12/11/20   Olive Bass, FNP  ALPRAZolam Prudy Feeler) 0.5 MG tablet TAKE 1 TABLET BY MOUTH THREE TIMES A DAY AS NEEDED FOR ANXIETY Patient taking differently: Take 0.5 mg by mouth 3 (three) times daily as  needed for anxiety. 01/12/21   Corwin Levins, MD  amiodarone (PACERONE) 200 MG tablet Take 1 tablet (200 mg total) by mouth 2 (two) times daily. Take 1 pill twice daily for 14 days and then one pill daily thereafter. 01/22/21   Meriam Sprague, MD  apixaban (ELIQUIS) 5 MG TABS tablet Take 1 tablet (5 mg total) by mouth 2 (two) times daily. 01/02/21   Olive Bass, FNP  dapagliflozin propanediol (FARXIGA) 10 MG TABS tablet Take 1 tablet (10 mg total) by mouth daily. 12/24/20   Leroy Sea, MD  fenofibrate (TRICOR) 145 MG tablet Take 1 tablet (145 mg total) by mouth daily. 12/11/20   Olive Bass, FNP  folic acid (FOLVITE) 1 MG tablet Take 1 tablet (1 mg total) by  mouth daily. 01/02/21   Olive Bass, FNP  icosapent Ethyl (VASCEPA) 1 g capsule Take 2 capsules (2 g total) by mouth 2 (two) times daily. 11/11/19   Hilty, Lisette Abu, MD  metoprolol succinate (TOPROL-XL) 100 MG 24 hr tablet Take 1 tablet (100 mg total) by mouth daily. Take with or immediately following a meal. 01/02/21   Olive Bass, FNP  rosuvastatin (CRESTOR) 40 MG tablet TAKE 1 TABLET BY MOUTH ONCE DAILY. PATIENT NEEDS OV FOR FURTHER REFILLS Patient taking differently: Take 40 mg by mouth daily. 12/11/20   Hilty, Lisette Abu, MD  sacubitril-valsartan (ENTRESTO) 24-26 MG Take 1 tablet by mouth 2 (two) times daily. 01/02/21   Olive Bass, FNP  thiamine 100 MG tablet Take 1 tablet (100 mg total) by mouth daily. 12/24/20   Leroy Sea, MD    Family History    Family History  Adopted: Yes   is adopted.   Social History    Social History   Socioeconomic History   Marital status: Married    Spouse name: Leevon Upperman   Number of children: 0   Years of education: 12   Highest education level: Associate degree: academic program  Occupational History   Occupation: unemployed    Comment: former Advice worker.   Tobacco Use   Smoking status: Every Day    Current packs/day: 2.50    Average packs/day: 2.5 packs/day for 25.0 years (62.5 ttl pk-yrs)    Types: Cigarettes   Smokeless tobacco: Never  Vaping Use   Vaping status: Never Used  Substance and Sexual Activity   Alcohol use: Yes    Alcohol/week: 70.0 standard drinks of alcohol    Types: 35 Cans of beer, 35 Shots of liquor per week   Drug use: No   Sexual activity: Not Currently    Partners: Female    Birth control/protection: None  Other Topics Concern   Not on file  Social History Narrative   Denies religious beliefs effecting health care.    Social Drivers of Health   Financial Resource Strain: High Risk (12/20/2022)   Overall Financial Resource Strain (CARDIA)    Difficulty of  Paying Living Expenses: Hard  Food Insecurity: No Food Insecurity (09/18/2023)   Hunger Vital Sign    Worried About Running Out of Food in the Last Year: Never true    Ran Out of Food in the Last Year: Never true  Transportation Needs: No Transportation Needs (09/18/2023)   PRAPARE - Administrator, Civil Service (Medical): No    Lack of Transportation (Non-Medical): No  Physical Activity: Unknown (12/20/2022)   Exercise Vital Sign    Days of Exercise per Week:  Patient declined    Minutes of Exercise per Session: Not on file  Stress: Stress Concern Present (12/20/2022)   [employer] of Occupational Health - Occupational Stress Questionnaire    Feeling of Stress : Very much  Social Connections: Socially Isolated (12/20/2022)   Social Connection and Isolation Panel [NHANES]    Frequency of Communication with Friends and Family: Once a week    Frequency of Social Gatherings with Friends and Family: Never    Attends Religious Services: Never    Database administrator or Organizations: No    Attends Engineer, structural: Not on file    Marital Status: Married  Catering manager Violence: Not At Risk (09/18/2023)   Humiliation, Afraid, Rape, and Kick questionnaire    Fear of Current or Ex-Partner: No    Emotionally Abused: No    Physically Abused: No    Sexually Abused: No     Review of Systems    General:  No chills, fever, night sweats or weight changes.  Cardiovascular:  No chest pain, dyspnea on exertion, edema, orthopnea, palpitations, paroxysmal nocturnal dyspnea. Dermatological: No rash, lesions/masses Respiratory: No cough, dyspnea Urologic: No hematuria, dysuria Abdominal:   No nausea, vomiting, diarrhea, bright red blood per rectum, melena, or hematemesis Neurologic:  No visual changes, wkns, changes in mental status. All other systems reviewed and are otherwise negative except as noted above.  Physical Exam    VS:  There were no vitals taken for this  visit. , BMI There is no height or weight on file to calculate BMI. GEN: Well nourished, well developed, in no acute distress. HEENT: normal. Neck: Supple, no JVD, carotid bruits, or masses. Cardiac: Irregularly irregular, no murmurs, rubs, or gallops. No clubbing, cyanosis, generalized bilateral lower extremity nonpitting edema.  Radials/DP/PT 2+ and equal bilaterally.  Respiratory:  Respirations regular and unlabored, clear to auscultation bilaterally. GI: Soft, nontender, nondistended, BS + x 4. MS: no deformity or atrophy. Skin: warm and dry, no rash. Neuro:  Strength and sensation are intact. Psych: Normal affect.  Accessory Clinical Findings    Recent Labs: 09/18/2023: ALT 14; B Natriuretic Peptide 217.9 09/21/2023: Hemoglobin 8.9; Magnesium 2.1; Platelets 170 09/23/2023: BUN 16; Creatinine, Ser 1.71; Potassium 3.7; Sodium 134   Recent Lipid Panel    Component Value Date/Time   CHOL 93 (L) 04/22/2022 0958   TRIG 118 04/22/2022 0958   HDL 31 (L) 04/22/2022 0958   CHOLHDL 3.0 04/22/2022 0958   CHOLHDL 4 02/22/2019 1444   VLDL 53.8 (H) 02/22/2019 1444   LDLCALC 40 04/22/2022 0958   LDLDIRECT 48 10/20/2019 1212   LDLDIRECT 58.0 02/22/2019 1444    ECG personally reviewed by me today-atrial fibrillation with RVR 110 bpm  EKG 02/07/2021  atrial fibrillation incomplete right bundle branch block 95 bpm- No acute changes  EKG 01/15/2021 atrial fibrillation with RVR incomplete right bundle branch block 120 bpm- No acute changes   Echocardiogram 12/17/20 IMPRESSIONS     1. Left ventricular ejection fraction, by estimation, is 30 to 35%. The  left ventricle has moderately decreased function. The left ventricle  demonstrates global hypokinesis. Left ventricular diastolic function could  not be evaluated.   2. Right ventricular systolic function is normal. The right ventricular  size is normal. There is normal pulmonary artery systolic pressure.   3. Right atrial size was mildly  dilated.   4. The mitral valve is normal in structure. No evidence of mitral valve  regurgitation.   5. The aortic valve is  normal in structure. Aortic valve regurgitation is  not visualized.   Cardiac catheterization 12/19/2020 There is moderate left ventricular systolic dysfunction. LV end diastolic pressure is normal. The left ventricular ejection fraction is 35-45% by visual estimate. Hemodynamic findings consistent with mild pulmonary hypertension.   1. Normal coronary anatomy 2. Moderate LV dysfunction. EF estimated at 40% 3. Normal LV filling pressures 4. Mild pulmonary HTN. Mean PAP 24 mm Hg 5. Reduced cardiac output. Co ox 63%. Cardiac index 1.89   Plan: medical management. OK to start oral anticoagulation this pm.  Diagnostic Dominance: Co-dominant       Echocardiogram 12/03/2021  IMPRESSIONS     1. Left ventricular ejection fraction, by estimation, is 40 to 45%. The  left ventricle has mildly decreased function. The left ventricle  demonstrates global hypokinesis. Left ventricular diastolic parameters are  indeterminate.   2. Right ventricular systolic function is mildly reduced. The right  ventricular size is normal.   3. The mitral valve is normal in structure. No evidence of mitral valve  regurgitation.   4. The aortic valve is tricuspid. Aortic valve regurgitation is not  visualized. No aortic stenosis is present.   5. Aortic dilatation noted. There is mild dilatation of the ascending  aorta, measuring 43 mm. There is borderline dilatation of the ascending  aorta, measuring 36 mm.   6. The inferior vena cava is normal in size with greater than 50%  respiratory variability, suggesting right atrial pressure of 3 mmHg.   Comparison(s): Compared to prior TTE, the LVEF appears improved to 40-45%.  Otherwise, there is no significant change.  Assessment & Plan   1.  Combined systolic and diastolic CHF-weight stable.  Euvolemic.  Repeat echocardiogram showed  stable LVEF of 40-45%.    CHF secondary to A. fib RVR or EtOH cardiomyopathy.   Catheterization showed normal coronary anatomy.  He was previously initiated on Entresto.  No spironolactone due to hyperkalemia.  He experienced dizziness while on Entresto and low blood pressures in the 80s over 50s-60s. Continue  metoprolol Stop [employer]KE valratan 20 mg  Heart healthy low-sodium diet-salty 6 given Increase physical activity as tolerated Daily weights Repeat BMP today and in 2 weeks.  Atrial fibrillation with RVR-heart rate today 55 bpm.  CHA2DS2-VASc score 2 (CHF ,hypertension).  Continues to be compliant with apixaban.  No bleeding issues.  Remains cardiac unaware.  Underwent DCCV 01/15/2021 with 3 shocks and was unsuccessful.  Follow-up echocardiogram showed stable LVEF.  Details above.  Fatigue chronic stable.    Continues to drink about 3-4 beers per day.  Alcohol cessation strongly encouraged. Reduce metoprolol 50 mg p.m. and continue 50 mg a.m. Continue apixaban Heart healthy low-sodium diet-salty 6 reviewed Increase physical activity as tolerated  AKI- creatinine 2.08 on 01/04/2022.  Baseline appears to be 1.4-1.50. Follows with PCP   Disposition: Follow-up with [name] in 1-2 months.   Thomasene Ripple. Atiana Levier NP-C    10/22/2023, 12:21 PM [facility] [address] Office [phone] Fax [phone]  Notice: This dictation was prepared with Dragon dictation along with smaller phrase technology. Any transcriptional errors that result from this process are unintentional and may not be corrected upon review.  I spent 14*** minutes examining this patient, reviewing medications, and using patient centered shared decision making involving her cardiac care.   I spent  20 minutes reviewing  past medical history,  medications, and prior cardiac tests.

## 2023-10-24 ENCOUNTER — Inpatient Hospital Stay (HOSPITAL_COMMUNITY): Payer: Medicaid Other

## 2023-10-24 ENCOUNTER — Inpatient Hospital Stay: Payer: Self-pay

## 2023-10-24 ENCOUNTER — Other Ambulatory Visit: Payer: Self-pay

## 2023-10-24 ENCOUNTER — Emergency Department (HOSPITAL_COMMUNITY): Payer: Medicaid Other | Admitting: Certified Registered Nurse Anesthetist

## 2023-10-24 ENCOUNTER — Encounter (HOSPITAL_COMMUNITY)
Admission: EM | Disposition: A | Discharge status: Hospice | Payer: Self-pay | Source: Home / Self Care | Attending: Pulmonary Disease

## 2023-10-24 ENCOUNTER — Encounter (HOSPITAL_COMMUNITY): Payer: Self-pay | Admitting: *Deleted

## 2023-10-24 ENCOUNTER — Inpatient Hospital Stay (HOSPITAL_COMMUNITY)
Admission: EM | Admit: 2023-10-24 | Discharge: 2023-11-11 | DRG: 853 | Disposition: A | Discharge status: Hospice | Payer: Medicaid Other | Attending: Pulmonary Disease | Admitting: Pulmonary Disease

## 2023-10-24 ENCOUNTER — Ambulatory Visit: Payer: MEDICAID | Admitting: General Practice

## 2023-10-24 ENCOUNTER — Emergency Department (HOSPITAL_COMMUNITY): Payer: Medicaid Other

## 2023-10-24 DIAGNOSIS — I4891 Unspecified atrial fibrillation: Secondary | ICD-10-CM

## 2023-10-24 DIAGNOSIS — Z1152 Encounter for screening for COVID-19: Secondary | ICD-10-CM

## 2023-10-24 DIAGNOSIS — Z9911 Dependence on respirator [ventilator] status: Secondary | ICD-10-CM

## 2023-10-24 DIAGNOSIS — J9600 Acute respiratory failure, unspecified whether with hypoxia or hypercapnia: Secondary | ICD-10-CM | POA: Diagnosis not present

## 2023-10-24 DIAGNOSIS — I671 Cerebral aneurysm, nonruptured: Secondary | ICD-10-CM | POA: Diagnosis present

## 2023-10-24 DIAGNOSIS — I96 Gangrene, not elsewhere classified: Secondary | ICD-10-CM | POA: Diagnosis present

## 2023-10-24 DIAGNOSIS — I5033 Acute on chronic diastolic (congestive) heart failure: Secondary | ICD-10-CM | POA: Diagnosis not present

## 2023-10-24 DIAGNOSIS — R1084 Generalized abdominal pain: Secondary | ICD-10-CM | POA: Diagnosis not present

## 2023-10-24 DIAGNOSIS — Z7984 Long term (current) use of oral hypoglycemic drugs: Secondary | ICD-10-CM

## 2023-10-24 DIAGNOSIS — I4819 Other persistent atrial fibrillation: Secondary | ICD-10-CM | POA: Diagnosis present

## 2023-10-24 DIAGNOSIS — K668 Other specified disorders of peritoneum: Secondary | ICD-10-CM | POA: Diagnosis not present

## 2023-10-24 DIAGNOSIS — I509 Heart failure, unspecified: Secondary | ICD-10-CM

## 2023-10-24 DIAGNOSIS — R7881 Bacteremia: Secondary | ICD-10-CM | POA: Diagnosis not present

## 2023-10-24 DIAGNOSIS — T17418A Gastric contents in trachea causing other injury, initial encounter: Secondary | ICD-10-CM | POA: Diagnosis not present

## 2023-10-24 DIAGNOSIS — Z9889 Other specified postprocedural states: Secondary | ICD-10-CM

## 2023-10-24 DIAGNOSIS — K631 Perforation of intestine (nontraumatic): Secondary | ICD-10-CM | POA: Diagnosis present

## 2023-10-24 DIAGNOSIS — R6521 Severe sepsis with septic shock: Secondary | ICD-10-CM | POA: Diagnosis present

## 2023-10-24 DIAGNOSIS — E46 Unspecified protein-calorie malnutrition: Secondary | ICD-10-CM | POA: Diagnosis not present

## 2023-10-24 DIAGNOSIS — I110 Hypertensive heart disease with heart failure: Secondary | ICD-10-CM

## 2023-10-24 DIAGNOSIS — Z515 Encounter for palliative care: Secondary | ICD-10-CM | POA: Diagnosis not present

## 2023-10-24 DIAGNOSIS — E874 Mixed disorder of acid-base balance: Secondary | ICD-10-CM | POA: Diagnosis present

## 2023-10-24 DIAGNOSIS — E7800 Pure hypercholesterolemia, unspecified: Secondary | ICD-10-CM | POA: Diagnosis present

## 2023-10-24 DIAGNOSIS — I10 Essential (primary) hypertension: Secondary | ICD-10-CM | POA: Diagnosis not present

## 2023-10-24 DIAGNOSIS — N1831 Chronic kidney disease, stage 3a: Secondary | ICD-10-CM | POA: Diagnosis present

## 2023-10-24 DIAGNOSIS — E876 Hypokalemia: Secondary | ICD-10-CM | POA: Diagnosis not present

## 2023-10-24 DIAGNOSIS — L97529 Non-pressure chronic ulcer of other part of left foot with unspecified severity: Secondary | ICD-10-CM | POA: Diagnosis present

## 2023-10-24 DIAGNOSIS — R34 Anuria and oliguria: Secondary | ICD-10-CM | POA: Diagnosis not present

## 2023-10-24 DIAGNOSIS — F32A Depression, unspecified: Secondary | ICD-10-CM | POA: Diagnosis present

## 2023-10-24 DIAGNOSIS — K659 Peritonitis, unspecified: Secondary | ICD-10-CM

## 2023-10-24 DIAGNOSIS — K429 Umbilical hernia without obstruction or gangrene: Secondary | ICD-10-CM | POA: Diagnosis present

## 2023-10-24 DIAGNOSIS — D649 Anemia, unspecified: Secondary | ICD-10-CM | POA: Diagnosis not present

## 2023-10-24 DIAGNOSIS — N179 Acute kidney failure, unspecified: Secondary | ICD-10-CM | POA: Diagnosis not present

## 2023-10-24 DIAGNOSIS — I729 Aneurysm of unspecified site: Secondary | ICD-10-CM | POA: Diagnosis not present

## 2023-10-24 DIAGNOSIS — R188 Other ascites: Secondary | ICD-10-CM | POA: Diagnosis present

## 2023-10-24 DIAGNOSIS — R0989 Other specified symptoms and signs involving the circulatory and respiratory systems: Secondary | ICD-10-CM | POA: Diagnosis not present

## 2023-10-24 DIAGNOSIS — K658 Other peritonitis: Secondary | ICD-10-CM | POA: Diagnosis present

## 2023-10-24 DIAGNOSIS — A419 Sepsis, unspecified organism: Principal | ICD-10-CM | POA: Diagnosis present

## 2023-10-24 DIAGNOSIS — D631 Anemia in chronic kidney disease: Secondary | ICD-10-CM | POA: Diagnosis present

## 2023-10-24 DIAGNOSIS — K319 Disease of stomach and duodenum, unspecified: Secondary | ICD-10-CM | POA: Diagnosis not present

## 2023-10-24 DIAGNOSIS — R918 Other nonspecific abnormal finding of lung field: Secondary | ICD-10-CM | POA: Diagnosis not present

## 2023-10-24 DIAGNOSIS — K838 Other specified diseases of biliary tract: Secondary | ICD-10-CM | POA: Diagnosis not present

## 2023-10-24 DIAGNOSIS — D638 Anemia in other chronic diseases classified elsewhere: Secondary | ICD-10-CM

## 2023-10-24 DIAGNOSIS — Z8673 Personal history of transient ischemic attack (TIA), and cerebral infarction without residual deficits: Secondary | ICD-10-CM

## 2023-10-24 DIAGNOSIS — R1110 Vomiting, unspecified: Secondary | ICD-10-CM | POA: Diagnosis not present

## 2023-10-24 DIAGNOSIS — F419 Anxiety disorder, unspecified: Secondary | ICD-10-CM | POA: Diagnosis present

## 2023-10-24 DIAGNOSIS — E43 Unspecified severe protein-calorie malnutrition: Secondary | ICD-10-CM | POA: Diagnosis present

## 2023-10-24 DIAGNOSIS — Z7901 Long term (current) use of anticoagulants: Secondary | ICD-10-CM

## 2023-10-24 DIAGNOSIS — I5031 Acute diastolic (congestive) heart failure: Secondary | ICD-10-CM | POA: Diagnosis not present

## 2023-10-24 DIAGNOSIS — B952 Enterococcus as the cause of diseases classified elsewhere: Secondary | ICD-10-CM | POA: Diagnosis not present

## 2023-10-24 DIAGNOSIS — Z66 Do not resuscitate: Secondary | ICD-10-CM | POA: Diagnosis not present

## 2023-10-24 DIAGNOSIS — Z4682 Encounter for fitting and adjustment of non-vascular catheter: Secondary | ICD-10-CM | POA: Diagnosis not present

## 2023-10-24 DIAGNOSIS — S31109A Unspecified open wound of abdominal wall, unspecified quadrant without penetration into peritoneal cavity, initial encounter: Secondary | ICD-10-CM | POA: Diagnosis not present

## 2023-10-24 DIAGNOSIS — I951 Orthostatic hypotension: Secondary | ICD-10-CM | POA: Diagnosis present

## 2023-10-24 DIAGNOSIS — K567 Ileus, unspecified: Secondary | ICD-10-CM | POA: Diagnosis not present

## 2023-10-24 DIAGNOSIS — R680 Hypothermia, not associated with low environmental temperature: Secondary | ICD-10-CM | POA: Diagnosis not present

## 2023-10-24 DIAGNOSIS — K55039 Acute (reversible) ischemia of large intestine, extent unspecified: Secondary | ICD-10-CM | POA: Diagnosis not present

## 2023-10-24 DIAGNOSIS — K650 Generalized (acute) peritonitis: Secondary | ICD-10-CM | POA: Diagnosis not present

## 2023-10-24 DIAGNOSIS — K6389 Other specified diseases of intestine: Secondary | ICD-10-CM | POA: Diagnosis not present

## 2023-10-24 DIAGNOSIS — I5032 Chronic diastolic (congestive) heart failure: Secondary | ICD-10-CM

## 2023-10-24 DIAGNOSIS — A4181 Sepsis due to Enterococcus: Principal | ICD-10-CM | POA: Diagnosis present

## 2023-10-24 DIAGNOSIS — J9601 Acute respiratory failure with hypoxia: Secondary | ICD-10-CM | POA: Diagnosis not present

## 2023-10-24 DIAGNOSIS — M109 Gout, unspecified: Secondary | ICD-10-CM | POA: Diagnosis present

## 2023-10-24 DIAGNOSIS — Z0389 Encounter for observation for other suspected diseases and conditions ruled out: Secondary | ICD-10-CM | POA: Diagnosis not present

## 2023-10-24 DIAGNOSIS — I130 Hypertensive heart and chronic kidney disease with heart failure and stage 1 through stage 4 chronic kidney disease, or unspecified chronic kidney disease: Secondary | ICD-10-CM | POA: Diagnosis present

## 2023-10-24 DIAGNOSIS — I5041 Acute combined systolic (congestive) and diastolic (congestive) heart failure: Secondary | ICD-10-CM | POA: Diagnosis not present

## 2023-10-24 DIAGNOSIS — F05 Delirium due to known physiological condition: Secondary | ICD-10-CM | POA: Diagnosis not present

## 2023-10-24 DIAGNOSIS — F17210 Nicotine dependence, cigarettes, uncomplicated: Secondary | ICD-10-CM | POA: Diagnosis present

## 2023-10-24 DIAGNOSIS — K55069 Acute infarction of intestine, part and extent unspecified: Secondary | ICD-10-CM | POA: Diagnosis not present

## 2023-10-24 DIAGNOSIS — Z452 Encounter for adjustment and management of vascular access device: Secondary | ICD-10-CM | POA: Diagnosis not present

## 2023-10-24 DIAGNOSIS — E669 Obesity, unspecified: Secondary | ICD-10-CM | POA: Diagnosis present

## 2023-10-24 DIAGNOSIS — Z781 Physical restraint status: Secondary | ICD-10-CM

## 2023-10-24 DIAGNOSIS — Z888 Allergy status to other drugs, medicaments and biological substances status: Secondary | ICD-10-CM

## 2023-10-24 DIAGNOSIS — R140 Abdominal distension (gaseous): Secondary | ICD-10-CM | POA: Diagnosis not present

## 2023-10-24 DIAGNOSIS — H04123 Dry eye syndrome of bilateral lacrimal glands: Secondary | ICD-10-CM | POA: Diagnosis present

## 2023-10-24 DIAGNOSIS — Z79899 Other long term (current) drug therapy: Secondary | ICD-10-CM

## 2023-10-24 DIAGNOSIS — Z4689 Encounter for fitting and adjustment of other specified devices: Secondary | ICD-10-CM | POA: Diagnosis not present

## 2023-10-24 DIAGNOSIS — R000 Tachycardia, unspecified: Secondary | ICD-10-CM | POA: Diagnosis not present

## 2023-10-24 DIAGNOSIS — K55049 Acute infarction of large intestine, extent unspecified: Secondary | ICD-10-CM | POA: Diagnosis present

## 2023-10-24 DIAGNOSIS — K559 Vascular disorder of intestine, unspecified: Secondary | ICD-10-CM | POA: Diagnosis not present

## 2023-10-24 DIAGNOSIS — I609 Nontraumatic subarachnoid hemorrhage, unspecified: Secondary | ICD-10-CM | POA: Diagnosis not present

## 2023-10-24 DIAGNOSIS — F418 Other specified anxiety disorders: Secondary | ICD-10-CM | POA: Diagnosis not present

## 2023-10-24 DIAGNOSIS — R001 Bradycardia, unspecified: Secondary | ICD-10-CM | POA: Diagnosis not present

## 2023-10-24 DIAGNOSIS — Z6834 Body mass index (BMI) 34.0-34.9, adult: Secondary | ICD-10-CM

## 2023-10-24 DIAGNOSIS — E162 Hypoglycemia, unspecified: Secondary | ICD-10-CM | POA: Diagnosis not present

## 2023-10-24 DIAGNOSIS — F1020 Alcohol dependence, uncomplicated: Secondary | ICD-10-CM | POA: Diagnosis present

## 2023-10-24 DIAGNOSIS — E871 Hypo-osmolality and hyponatremia: Secondary | ICD-10-CM | POA: Diagnosis not present

## 2023-10-24 DIAGNOSIS — E860 Dehydration: Secondary | ICD-10-CM | POA: Diagnosis present

## 2023-10-24 DIAGNOSIS — J90 Pleural effusion, not elsewhere classified: Secondary | ICD-10-CM | POA: Diagnosis not present

## 2023-10-24 DIAGNOSIS — K828 Other specified diseases of gallbladder: Secondary | ICD-10-CM | POA: Diagnosis not present

## 2023-10-24 HISTORY — PX: LAPAROTOMY: SHX154

## 2023-10-24 HISTORY — PX: COLON RESECTION: SHX5231

## 2023-10-24 LAB — COMPREHENSIVE METABOLIC PANEL
ALT: 10 U/L (ref 0–44)
ALT: 7 U/L (ref 0–44)
AST: 38 U/L (ref 15–41)
AST: 44 U/L — ABNORMAL HIGH (ref 15–41)
Albumin: 2.5 g/dL — ABNORMAL LOW (ref 3.5–5.0)
Albumin: 1.6 g/dL — ABNORMAL LOW (ref 3.5–5.0)
Alkaline Phosphatase: 33 U/L — ABNORMAL LOW (ref 38–126)
Alkaline Phosphatase: 23 U/L — ABNORMAL LOW (ref 38–126)
Anion gap: 26 — ABNORMAL HIGH (ref 5–15)
Anion gap: 19 — ABNORMAL HIGH (ref 5–15)
BUN: 28 mg/dL — ABNORMAL HIGH (ref 6–20)
BUN: 29 mg/dL — ABNORMAL HIGH (ref 6–20)
CO2: 19 mmol/L — ABNORMAL LOW (ref 22–32)
CO2: 18 mmol/L — ABNORMAL LOW (ref 22–32)
Calcium: 8.4 mg/dL — ABNORMAL LOW (ref 8.9–10.3)
Calcium: 7.4 mg/dL — ABNORMAL LOW (ref 8.9–10.3)
Chloride: 92 mmol/L — ABNORMAL LOW (ref 98–111)
Chloride: 97 mmol/L — ABNORMAL LOW (ref 98–111)
Creatinine, Ser: 3.12 mg/dL — ABNORMAL HIGH (ref 0.61–1.24)
Creatinine, Ser: 2.53 mg/dL — ABNORMAL HIGH (ref 0.61–1.24)
GFR, Estimated: 22 mL/min — ABNORMAL LOW (ref >60)
GFR, Estimated: 29 mL/min — ABNORMAL LOW (ref >60)
Glucose, Bld: 116 mg/dL — ABNORMAL HIGH (ref 70–99)
Glucose, Bld: 121 mg/dL — ABNORMAL HIGH (ref 70–99)
Potassium: 2.4 mmol/L — CL (ref 3.5–5.1)
Potassium: 3.4 mmol/L — ABNORMAL LOW (ref 3.5–5.1)
Sodium: 137 mmol/L (ref 135–145)
Sodium: 134 mmol/L — ABNORMAL LOW (ref 135–145)
Total Bilirubin: 1.1 mg/dL (ref 0.0–1.2)
Total Bilirubin: 0.8 mg/dL (ref 0.0–1.2)
Total Protein: 6.2 g/dL — ABNORMAL LOW (ref 6.5–8.1)
Total Protein: 4 g/dL — ABNORMAL LOW (ref 6.5–8.1)

## 2023-10-24 LAB — URINALYSIS, W/ REFLEX TO CULTURE (INFECTION SUSPECTED)
Bacteria, UA: NONE SEEN
Bilirubin Urine: NEGATIVE
Glucose, UA: 150 mg/dL — AB
Hgb urine dipstick: NEGATIVE
Ketones, ur: NEGATIVE mg/dL
Leukocytes,Ua: NEGATIVE
Nitrite: NEGATIVE
Protein, ur: NEGATIVE mg/dL
Specific Gravity, Urine: 1.016 (ref 1.005–1.030)
pH: 5 (ref 5.0–8.0)

## 2023-10-24 LAB — CBC WITH DIFFERENTIAL/PLATELET
Abs Immature Granulocytes: 0.1 10*3/uL — ABNORMAL HIGH (ref 0.00–0.07)
Basophils Absolute: 0.1 10*3/uL (ref 0.0–0.1)
Basophils Relative: 0 %
Eosinophils Absolute: 0.2 10*3/uL (ref 0.0–0.5)
Eosinophils Relative: 1 %
HCT: 47.7 % (ref 39.0–52.0)
Hemoglobin: 14.4 g/dL (ref 13.0–17.0)
Immature Granulocytes: 1 %
Lymphocytes Relative: 4 %
Lymphs Abs: 0.7 10*3/uL (ref 0.7–4.0)
MCH: 26.3 pg (ref 26.0–34.0)
MCHC: 30.2 g/dL (ref 30.0–36.0)
MCV: 87 fL (ref 80.0–100.0)
Monocytes Absolute: 1.3 10*3/uL — ABNORMAL HIGH (ref 0.1–1.0)
Monocytes Relative: 7 %
Neutro Abs: 17.3 10*3/uL — ABNORMAL HIGH (ref 1.7–7.7)
Neutrophils Relative %: 87 %
Platelets: 724 10*3/uL — ABNORMAL HIGH (ref 150–400)
RBC: 5.48 MIL/uL (ref 4.22–5.81)
RDW: 22 % — ABNORMAL HIGH (ref 11.5–15.5)
Smear Review: INCREASED
WBC Morphology: INCREASED
WBC: 19.6 10*3/uL — ABNORMAL HIGH (ref 4.0–10.5)
nRBC: 0 % (ref 0.0–0.2)

## 2023-10-24 LAB — URINALYSIS, ROUTINE W REFLEX MICROSCOPIC
Bilirubin Urine: NEGATIVE
Glucose, UA: >=500 mg/dL — AB
Hgb urine dipstick: NEGATIVE
Ketones, ur: NEGATIVE mg/dL
Leukocytes,Ua: NEGATIVE
Nitrite: NEGATIVE
Protein, ur: 30 mg/dL — AB
Specific Gravity, Urine: 1.016 (ref 1.005–1.030)
pH: 5 (ref 5.0–8.0)

## 2023-10-24 LAB — POCT I-STAT 7, (LYTES, BLD GAS, ICA,H+H)
Acid-base deficit: 8 mmol/L — ABNORMAL HIGH (ref 0.0–2.0)
Acid-base deficit: 9 mmol/L — ABNORMAL HIGH (ref 0.0–2.0)
Acid-base deficit: 7 mmol/L — ABNORMAL HIGH (ref 0.0–2.0)
Bicarbonate: 18.8 mmol/L — ABNORMAL LOW (ref 20.0–28.0)
Bicarbonate: 17.1 mmol/L — ABNORMAL LOW (ref 20.0–28.0)
Bicarbonate: 16.6 mmol/L — ABNORMAL LOW (ref 20.0–28.0)
Calcium, Ion: 1.08 mmol/L — ABNORMAL LOW (ref 1.15–1.40)
Calcium, Ion: 1.07 mmol/L — ABNORMAL LOW (ref 1.15–1.40)
Calcium, Ion: 1.06 mmol/L — ABNORMAL LOW (ref 1.15–1.40)
HCT: 36 % — ABNORMAL LOW (ref 39.0–52.0)
HCT: 26 % — ABNORMAL LOW (ref 39.0–52.0)
HCT: 28 % — ABNORMAL LOW (ref 39.0–52.0)
Hemoglobin: 12.2 g/dL — ABNORMAL LOW (ref 13.0–17.0)
Hemoglobin: 8.8 g/dL — ABNORMAL LOW (ref 13.0–17.0)
Hemoglobin: 9.5 g/dL — ABNORMAL LOW (ref 13.0–17.0)
O2 Saturation: 95 %
O2 Saturation: 97 %
O2 Saturation: 99 %
Patient temperature: 98.2
Potassium: 2.9 mmol/L — ABNORMAL LOW (ref 3.5–5.1)
Potassium: 3.7 mmol/L (ref 3.5–5.1)
Potassium: 2.9 mmol/L — ABNORMAL LOW (ref 3.5–5.1)
Sodium: 135 mmol/L (ref 135–145)
Sodium: 136 mmol/L (ref 135–145)
Sodium: 134 mmol/L — ABNORMAL LOW (ref 135–145)
TCO2: 20 mmol/L — ABNORMAL LOW (ref 22–32)
TCO2: 18 mmol/L — ABNORMAL LOW (ref 22–32)
TCO2: 17 mmol/L — ABNORMAL LOW (ref 22–32)
pCO2 arterial: 42.4 mm[Hg] (ref 32–48)
pCO2 arterial: 38.1 mm[Hg] (ref 32–48)
pCO2 arterial: 27.6 mm[Hg] — ABNORMAL LOW (ref 32–48)
pH, Arterial: 7.256 — ABNORMAL LOW (ref 7.35–7.45)
pH, Arterial: 7.258 — ABNORMAL LOW (ref 7.35–7.45)
pH, Arterial: 7.386 (ref 7.35–7.45)
pO2, Arterial: 86 mm[Hg] (ref 83–108)
pO2, Arterial: 106 mm[Hg] (ref 83–108)
pO2, Arterial: 148 mm[Hg] — ABNORMAL HIGH (ref 83–108)

## 2023-10-24 LAB — AMMONIA: Ammonia: 43 umol/L — ABNORMAL HIGH (ref 9–35)

## 2023-10-24 LAB — AMYLASE: Amylase: 114 U/L — ABNORMAL HIGH (ref 28–100)

## 2023-10-24 LAB — I-STAT CHEM 8, ED
BUN: 31 mg/dL — ABNORMAL HIGH (ref 6–20)
Calcium, Ion: 0.89 mmol/L — CL (ref 1.15–1.40)
Chloride: 95 mmol/L — ABNORMAL LOW (ref 98–111)
Creatinine, Ser: 2.8 mg/dL — ABNORMAL HIGH (ref 0.61–1.24)
Glucose, Bld: 119 mg/dL — ABNORMAL HIGH (ref 70–99)
HCT: 47 % (ref 39.0–52.0)
Hemoglobin: 16 g/dL (ref 13.0–17.0)
Potassium: 2.4 mmol/L — CL (ref 3.5–5.1)
Sodium: 136 mmol/L (ref 135–145)
TCO2: 23 mmol/L (ref 22–32)

## 2023-10-24 LAB — I-STAT CG4 LACTIC ACID, ED: Lactic Acid, Venous: 10.3 mmol/L (ref 0.5–1.9)

## 2023-10-24 LAB — MAGNESIUM
Magnesium: 2.2 mg/dL (ref 1.7–2.4)
Magnesium: 1.7 mg/dL (ref 1.7–2.4)

## 2023-10-24 LAB — TYPE AND SCREEN
ABO/RH(D): A NEG
Antibody Screen: NEGATIVE

## 2023-10-24 LAB — LACTIC ACID, PLASMA: Lactic Acid, Venous: 8.1 mmol/L (ref 0.5–1.9)

## 2023-10-24 LAB — PROTIME-INR
INR: 4 — ABNORMAL HIGH (ref 0.8–1.2)
Prothrombin Time: 39.4 s — ABNORMAL HIGH (ref 11.4–15.2)

## 2023-10-24 LAB — CBC
HCT: 37.6 % — ABNORMAL LOW (ref 39.0–52.0)
Hemoglobin: 11.4 g/dL — ABNORMAL LOW (ref 13.0–17.0)
MCH: 26.5 pg (ref 26.0–34.0)
MCHC: 30.3 g/dL (ref 30.0–36.0)
MCV: 87.4 fL (ref 80.0–100.0)
Platelets: 618 10*3/uL — ABNORMAL HIGH (ref 150–400)
RBC: 4.3 MIL/uL (ref 4.22–5.81)
RDW: 21.3 % — ABNORMAL HIGH (ref 11.5–15.5)
WBC: 8.2 10*3/uL (ref 4.0–10.5)
nRBC: 0.2 % (ref 0.0–0.2)

## 2023-10-24 LAB — RESP PANEL BY RT-PCR (RSV, FLU A&B, COVID)  RVPGX2
Influenza A by PCR: NEGATIVE
Influenza B by PCR: NEGATIVE
Resp Syncytial Virus by PCR: NEGATIVE
SARS Coronavirus 2 by RT PCR: NEGATIVE

## 2023-10-24 LAB — LIPASE, BLOOD: Lipase: 14 U/L (ref 11–51)

## 2023-10-24 LAB — PHOSPHORUS: Phosphorus: 4.2 mg/dL (ref 2.5–4.6)

## 2023-10-24 LAB — CORTISOL: Cortisol, Plasma: 91.5 ug/dL

## 2023-10-24 LAB — APTT: aPTT: 38 s — ABNORMAL HIGH (ref 24–36)

## 2023-10-24 SURGERY — LAPAROTOMY, EXPLORATORY
Anesthesia: General | Site: Abdomen

## 2023-10-24 MED ORDER — HYDROMORPHONE HCL 1 MG/ML IJ SOLN: 1.0000 mg | Freq: Once | INTRAMUSCULAR | Status: DC

## 2023-10-24 MED ORDER — CHLORHEXIDINE GLUCONATE CLOTH 2 % EX PADS
6.0000 | MEDICATED_PAD | Freq: Every day | CUTANEOUS | Status: DC
  Administered 2023-10-26 – 2023-11-11 (×19): 6 via TOPICAL

## 2023-10-24 MED ORDER — PANTOPRAZOLE SODIUM 40 MG IV SOLR
40.0000 mg | Freq: Every day | INTRAVENOUS | Status: DC
  Administered 2023-10-24 – 2023-11-10 (×17): 40 mg via INTRAVENOUS
  Filled 2023-10-24 (×17): qty 10

## 2023-10-24 MED ORDER — PROPOFOL 1000 MG/100ML IV EMUL
0.0000 ug/kg/min | INTRAVENOUS | Status: DC
  Administered 2023-10-24 – 2023-10-28 (×20): 35 ug/kg/min via INTRAVENOUS
  Administered 2023-10-28 – 2023-10-29 (×4): 30 ug/kg/min via INTRAVENOUS
  Filled 2023-10-24 (×10): qty 100
  Filled 2023-10-24: qty 200
  Filled 2023-10-24 (×7): qty 100
  Filled 2023-10-24: qty 200
  Filled 2023-10-24 (×4): qty 100

## 2023-10-24 MED ORDER — SODIUM CHLORIDE 0.9 % IV SOLN
4.0000 g | Freq: Once | INTRAVENOUS | Status: AC
  Administered 2023-10-24: 4 g via INTRAVENOUS
  Filled 2023-10-24: qty 40

## 2023-10-24 MED ORDER — VASOPRESSIN 20 UNIT/ML IV SOLN
INTRAVENOUS | Status: DC | PRN
  Administered 2023-10-24 (×5): 2 [IU] via INTRAVENOUS

## 2023-10-24 MED ORDER — ACETAMINOPHEN 650 MG RE SUPP
650.0000 mg | Freq: Once | RECTAL | Status: AC
  Administered 2023-10-24: 650 mg via RECTAL
  Filled 2023-10-24: qty 1

## 2023-10-24 MED ORDER — FENTANYL CITRATE (PF) 250 MCG/5ML IJ SOLN
INTRAMUSCULAR | Status: DC | PRN
  Administered 2023-10-24 (×5): 50 ug via INTRAVENOUS

## 2023-10-24 MED ORDER — METOPROLOL TARTRATE 5 MG/5ML IV SOLN
INTRAVENOUS | Status: DC | PRN
  Administered 2023-10-24: 1 mg via INTRAVENOUS
  Administered 2023-10-24: 2 mg via INTRAVENOUS

## 2023-10-24 MED ORDER — FENTANYL 2500MCG IN NS 250ML (10MCG/ML) PREMIX INFUSION
0.0000 ug/h | INTRAVENOUS | Status: DC
  Administered 2023-10-24 – 2023-10-28 (×4): 50 ug/h via INTRAVENOUS
  Administered 2023-10-29: 25 ug/h via INTRAVENOUS
  Administered 2023-10-30: 50 ug/h via INTRAVENOUS
  Administered 2023-10-31: 37.5 ug/h via INTRAVENOUS
  Filled 2023-10-24 (×7): qty 250

## 2023-10-24 MED ORDER — SODIUM CHLORIDE 0.9 % IV SOLN
2.0000 g | Freq: Once | INTRAVENOUS | Status: AC
  Administered 2023-10-24: 2 g via INTRAVENOUS
  Filled 2023-10-24: qty 12.5

## 2023-10-24 MED ORDER — LACTATED RINGERS IV SOLN: INTRAVENOUS | Status: DC | PRN

## 2023-10-24 MED ORDER — FENTANYL CITRATE (PF) 250 MCG/5ML IJ SOLN
INTRAMUSCULAR | Status: AC
  Filled 2023-10-24: qty 5

## 2023-10-24 MED ORDER — LACTATED RINGERS IV BOLUS
2000.0000 mL | Freq: Once | INTRAVENOUS | Status: AC
  Administered 2023-10-24: 2000 mL via INTRAVENOUS

## 2023-10-24 MED ORDER — POLYETHYLENE GLYCOL 3350 17 G PO PACK: 17.0000 g | PACK | Freq: Every day | ORAL | Status: DC | PRN

## 2023-10-24 MED ORDER — PROPOFOL 10 MG/ML IV BOLUS
INTRAVENOUS | Status: AC
  Filled 2023-10-24: qty 20

## 2023-10-24 MED ORDER — SODIUM CHLORIDE 0.9 % IV SOLN
2.0000 g | Freq: Two times a day (BID) | INTRAVENOUS | Status: DC
  Administered 2023-10-24: 2 g via INTRAVENOUS
  Filled 2023-10-24: qty 12.5

## 2023-10-24 MED ORDER — NOREPINEPHRINE 4 MG/250ML-% IV SOLN
INTRAVENOUS | Status: DC | PRN
  Administered 2023-10-24: 4 ug/min via INTRAVENOUS

## 2023-10-24 MED ORDER — METRONIDAZOLE 500 MG/100ML IV SOLN
500.0000 mg | Freq: Two times a day (BID) | INTRAVENOUS | Status: DC
  Administered 2023-10-24: 500 mg via INTRAVENOUS
  Filled 2023-10-24: qty 100

## 2023-10-24 MED ORDER — METRONIDAZOLE 500 MG/100ML IV SOLN
500.0000 mg | Freq: Once | INTRAVENOUS | Status: AC
  Administered 2023-10-24: 500 mg via INTRAVENOUS
  Filled 2023-10-24: qty 100

## 2023-10-24 MED ORDER — FENTANYL BOLUS VIA INFUSION: 50.0000 ug | INTRAVENOUS | Status: DC | PRN

## 2023-10-24 MED ORDER — ROCURONIUM BROMIDE 10 MG/ML (PF) SYRINGE
PREFILLED_SYRINGE | INTRAVENOUS | Status: AC
  Filled 2023-10-24: qty 10

## 2023-10-24 MED ORDER — METOPROLOL TARTRATE 5 MG/5ML IV SOLN
INTRAVENOUS | Status: AC
  Filled 2023-10-24: qty 5

## 2023-10-24 MED ORDER — ALBUTEROL SULFATE (2.5 MG/3ML) 0.083% IN NEBU: 2.5000 mg | INHALATION_SOLUTION | RESPIRATORY_TRACT | Status: DC | PRN

## 2023-10-24 MED ORDER — SODIUM CHLORIDE 0.9 % IV SOLN
100.0000 mg | INTRAVENOUS | Status: DC
  Administered 2023-10-24 – 2023-10-28 (×5): 100 mg via INTRAVENOUS
  Filled 2023-10-24 (×6): qty 5

## 2023-10-24 MED ORDER — NOREPINEPHRINE 4 MG/250ML-% IV SOLN
0.0000 ug/min | INTRAVENOUS | Status: DC
  Administered 2023-10-24: 20 ug/min via INTRAVENOUS
  Administered 2023-10-25: 18 ug/min via INTRAVENOUS
  Administered 2023-10-25: 17 ug/min via INTRAVENOUS
  Administered 2023-10-25: 14 ug/min via INTRAVENOUS
  Administered 2023-10-25: 20 ug/min via INTRAVENOUS
  Administered 2023-10-25: 18 ug/min via INTRAVENOUS
  Administered 2023-10-25: 16 ug/min via INTRAVENOUS
  Administered 2023-10-26: 12 ug/min via INTRAVENOUS
  Administered 2023-10-26 (×2): 17 ug/min via INTRAVENOUS
  Administered 2023-10-26: 8 ug/min via INTRAVENOUS
  Administered 2023-10-26: 17 ug/min via INTRAVENOUS
  Administered 2023-10-27: 8 ug/min via INTRAVENOUS
  Administered 2023-10-27: 6 ug/min via INTRAVENOUS
  Administered 2023-10-28: 4 ug/min via INTRAVENOUS
  Administered 2023-10-29: 3 ug/min via INTRAVENOUS
  Administered 2023-10-30: 2 ug/min via INTRAVENOUS
  Filled 2023-10-24 (×17): qty 250

## 2023-10-24 MED ORDER — POLYETHYLENE GLYCOL 3350 17 G PO PACK: 17.0000 g | PACK | Freq: Every day | ORAL | Status: DC

## 2023-10-24 MED ORDER — LACTATED RINGERS IV BOLUS
500.0000 mL | Freq: Once | INTRAVENOUS | Status: AC
  Administered 2023-10-24: 500 mL via INTRAVENOUS

## 2023-10-24 MED ORDER — ROCURONIUM BROMIDE 10 MG/ML (PF) SYRINGE
PREFILLED_SYRINGE | INTRAVENOUS | Status: DC | PRN
  Administered 2023-10-24: 50 mg via INTRAVENOUS
  Administered 2023-10-24: 30 mg via INTRAVENOUS
  Administered 2023-10-24: 20 mg via INTRAVENOUS

## 2023-10-24 MED ORDER — VASOPRESSIN 20 UNITS/100 ML INFUSION FOR SHOCK
INTRAVENOUS | Status: DC | PRN
  Administered 2023-10-24: .03 [IU]/min via INTRAVENOUS

## 2023-10-24 MED ORDER — ORAL CARE MOUTH RINSE: 15.0000 mL | OROMUCOSAL | Status: DC | PRN

## 2023-10-24 MED ORDER — LACTATED RINGERS IV SOLN
INTRAVENOUS | Status: DC
  Administered 2023-10-24 – 2023-10-25 (×2): 150 mL/h via INTRAVENOUS

## 2023-10-24 MED ORDER — POTASSIUM CHLORIDE 10 MEQ/100ML IV SOLN
10.0000 meq | INTRAVENOUS | Status: AC
  Administered 2023-10-24 (×3): 10 meq via INTRAVENOUS
  Filled 2023-10-24 (×2): qty 100

## 2023-10-24 MED ORDER — ORAL CARE MOUTH RINSE
15.0000 mL | OROMUCOSAL | Status: DC
  Administered 2023-10-24 – 2023-10-31 (×83): 15 mL via OROMUCOSAL

## 2023-10-24 MED ORDER — ONDANSETRON HCL 4 MG/2ML IJ SOLN
4.0000 mg | Freq: Four times a day (QID) | INTRAMUSCULAR | Status: DC | PRN
  Administered 2023-11-05: 4 mg via INTRAVENOUS
  Filled 2023-10-24 (×2): qty 2

## 2023-10-24 MED ORDER — PROPOFOL 500 MG/50ML IV EMUL
INTRAVENOUS | Status: DC | PRN
  Administered 2023-10-24: 35 ug/kg/min via INTRAVENOUS

## 2023-10-24 MED ORDER — MIDAZOLAM HCL 2 MG/2ML IJ SOLN
INTRAMUSCULAR | Status: AC
  Filled 2023-10-24: qty 2

## 2023-10-24 MED ORDER — DOCUSATE SODIUM 50 MG/5ML PO LIQD: 100.0000 mg | Freq: Two times a day (BID) | ORAL | Status: DC | PRN

## 2023-10-24 MED ORDER — VANCOMYCIN VARIABLE DOSE PER UNSTABLE RENAL FUNCTION (PHARMACIST DOSING): Status: DC

## 2023-10-24 MED ORDER — ALBUTEROL SULFATE (2.5 MG/3ML) 0.083% IN NEBU
2.5000 mg | INHALATION_SOLUTION | Freq: Four times a day (QID) | RESPIRATORY_TRACT | Status: DC
  Administered 2023-10-24 – 2023-10-26 (×5): 2.5 mg via RESPIRATORY_TRACT
  Filled 2023-10-24 (×5): qty 3

## 2023-10-24 MED ORDER — LIDOCAINE 2% (20 MG/ML) 5 ML SYRINGE
INTRAMUSCULAR | Status: DC | PRN
  Administered 2023-10-24: 80 mg via INTRAVENOUS

## 2023-10-24 MED ORDER — FENTANYL CITRATE PF 50 MCG/ML IJ SOSY
50.0000 ug | PREFILLED_SYRINGE | Freq: Once | INTRAMUSCULAR | Status: AC
  Administered 2023-10-24: 50 ug via INTRAVENOUS
  Filled 2023-10-24: qty 1

## 2023-10-24 MED ORDER — VASOPRESSIN 20 UNIT/ML IV SOLN
INTRAVENOUS | Status: AC
Start: 2023-10-24 — End: ?
  Filled 2023-10-24: qty 1

## 2023-10-24 MED ORDER — POTASSIUM CHLORIDE 10 MEQ/100ML IV SOLN
10.0000 meq | INTRAVENOUS | Status: AC
  Administered 2023-10-24 (×3): 10 meq via INTRAVENOUS
  Filled 2023-10-24 (×4): qty 100

## 2023-10-24 MED ORDER — 0.9 % SODIUM CHLORIDE (POUR BTL) OPTIME
TOPICAL | Status: DC | PRN
  Administered 2023-10-24: 7000 mL
  Administered 2023-10-24: 2000 mL
  Administered 2023-10-24: 1000 mL

## 2023-10-24 MED ORDER — SUCCINYLCHOLINE CHLORIDE 200 MG/10ML IV SOSY
PREFILLED_SYRINGE | INTRAVENOUS | Status: DC | PRN
  Administered 2023-10-24: 140 mg via INTRAVENOUS

## 2023-10-24 MED ORDER — LIDOCAINE 2% (20 MG/ML) 5 ML SYRINGE
INTRAMUSCULAR | Status: AC
Start: 2023-10-24 — End: ?
  Filled 2023-10-24: qty 5

## 2023-10-24 MED ORDER — VANCOMYCIN HCL 2000 MG/400ML IV SOLN
2000.0000 mg | INTRAVENOUS | Status: AC
  Administered 2023-10-24: 2000 mg via INTRAVENOUS
  Filled 2023-10-24: qty 400

## 2023-10-24 MED ORDER — LACTATED RINGERS IV BOLUS
1000.0000 mL | Freq: Once | INTRAVENOUS | Status: AC
  Administered 2023-10-24: 1000 mL via INTRAVENOUS

## 2023-10-24 MED ORDER — MIDAZOLAM HCL 2 MG/2ML IJ SOLN
INTRAMUSCULAR | Status: DC | PRN
Start: 2023-10-24 — End: 2023-10-24
  Administered 2023-10-24: 2 mg via INTRAVENOUS

## 2023-10-24 MED ORDER — DEXTROSE IN LACTATED RINGERS 5 % IV SOLN: INTRAVENOUS | Status: DC

## 2023-10-24 MED ORDER — POTASSIUM CHLORIDE 10 MEQ/100ML IV SOLN
INTRAVENOUS | Status: AC
Start: 2023-10-24 — End: 2023-10-24
  Administered 2023-10-24: 10 meq via INTRAVENOUS
  Filled 2023-10-24: qty 100

## 2023-10-24 MED ORDER — PROPOFOL 10 MG/ML IV BOLUS
INTRAVENOUS | Status: DC | PRN
  Administered 2023-10-24: 70 mg via INTRAVENOUS

## 2023-10-24 MED ORDER — SUCCINYLCHOLINE CHLORIDE 200 MG/10ML IV SOSY
PREFILLED_SYRINGE | INTRAVENOUS | Status: AC
  Filled 2023-10-24: qty 10

## 2023-10-24 MED ORDER — HALOPERIDOL LACTATE 5 MG/ML IJ SOLN: 2.5000 mg | Freq: Four times a day (QID) | INTRAMUSCULAR | Status: DC | PRN

## 2023-10-24 MED ORDER — SODIUM CHLORIDE 0.9 % IV SOLN
250.0000 mL | INTRAVENOUS | Status: AC
  Administered 2023-10-24: 250 mL via INTRAVENOUS

## 2023-10-24 MED ORDER — PIPERACILLIN-TAZOBACTAM 3.375 G IVPB: 3.3750 g | Freq: Three times a day (TID) | INTRAVENOUS | Status: DC

## 2023-10-24 MED ORDER — VASOPRESSIN 20 UNITS/100 ML INFUSION FOR SHOCK
0.0300 [IU]/min | INTRAVENOUS | Status: DC
  Administered 2023-10-24 – 2023-10-27 (×9): 0.03 [IU]/min via INTRAVENOUS
  Filled 2023-10-24 (×7): qty 100

## 2023-10-24 MED ORDER — DOCUSATE SODIUM 50 MG/5ML PO LIQD: 100.0000 mg | Freq: Two times a day (BID) | ORAL | Status: DC

## 2023-10-24 SURGICAL SUPPLY — 41 items
BLADE CLIPPER SURG (BLADE) IMPLANT
CANISTER SUCT 3000ML PPV (MISCELLANEOUS) ×1 IMPLANT
CANISTER WOUND CARE 500ML ATS (WOUND CARE) IMPLANT
CHLORAPREP W/TINT 26 (MISCELLANEOUS) ×1 IMPLANT
COVER SURGICAL LIGHT HANDLE (MISCELLANEOUS) ×1 IMPLANT
DRAPE LAPAROSCOPIC ABDOMINAL (DRAPES) ×1 IMPLANT
DRAPE WARM FLUID 44X44 (DRAPES) ×1 IMPLANT
DRSG OPSITE POSTOP 4X10 (GAUZE/BANDAGES/DRESSINGS) IMPLANT
DRSG OPSITE POSTOP 4X8 (GAUZE/BANDAGES/DRESSINGS) IMPLANT
ELECT BLADE 6.5 EXT (BLADE) IMPLANT
ELECT CAUTERY BLADE 6.4 (BLADE) ×1 IMPLANT
ELECT REM PT RETURN 9FT ADLT (ELECTROSURGICAL) ×1 IMPLANT
ELECTRODE REM PT RTRN 9FT ADLT (ELECTROSURGICAL) ×1 IMPLANT
GLOVE BIO SURGEON STRL SZ7.5 (GLOVE) ×2 IMPLANT
GLOVE BIOGEL PI IND STRL 8 (GLOVE) ×1 IMPLANT
GOWN STRL REUS W/ TWL LRG LVL3 (GOWN DISPOSABLE) ×1 IMPLANT
GOWN STRL REUS W/ TWL XL LVL3 (GOWN DISPOSABLE) ×1 IMPLANT
HANDLE SUCTION POOLE (INSTRUMENTS) ×1 IMPLANT
KIT BASIN OR (CUSTOM PROCEDURE TRAY) ×1 IMPLANT
KIT TURNOVER KIT B (KITS) ×1 IMPLANT
LIGASURE IMPACT 36 18CM CVD LR (INSTRUMENTS) IMPLANT
NS IRRIG 1000ML POUR BTL (IV SOLUTION) ×2 IMPLANT
PACK GENERAL/GYN (CUSTOM PROCEDURE TRAY) ×1 IMPLANT
PAD ARMBOARD 7.5X6 YLW CONV (MISCELLANEOUS) ×1 IMPLANT
PENCIL SMOKE EVACUATOR (MISCELLANEOUS) ×1 IMPLANT
RELOAD PROXIMATE 100 BLUE (MISCELLANEOUS) ×2 IMPLANT
RELOAD STAPLE 100 3.8 BLU REG (MISCELLANEOUS) IMPLANT
SPECIMEN JAR LARGE (MISCELLANEOUS) IMPLANT
SPONGE ABD ABTHERA ADVANCE (MISCELLANEOUS) IMPLANT
SPONGE T-LAP 18X18 ~~LOC~~+RFID (SPONGE) IMPLANT
STAPLER PROXIMATE 100MM BLUE (MISCELLANEOUS) IMPLANT
STAPLER VISISTAT 35W (STAPLE) ×1 IMPLANT
SUCTION POOLE HANDLE (INSTRUMENTS) ×1 IMPLANT
SUT PDS AB 1 TP1 54 (SUTURE) ×2 IMPLANT
SUT SILK 2 0 SH CR/8 (SUTURE) ×1 IMPLANT
SUT SILK 2 0 TIES 10X30 (SUTURE) ×1 IMPLANT
SUT SILK 3 0 SH CR/8 (SUTURE) ×1 IMPLANT
SUT SILK 3 0 TIES 10X30 (SUTURE) ×1 IMPLANT
TOWEL GREEN STERILE (TOWEL DISPOSABLE) ×1 IMPLANT
TRAY FOLEY MTR SLVR 16FR STAT (SET/KITS/TRAYS/PACK) ×1 IMPLANT
YANKAUER SUCT BULB TIP NO VENT (SUCTIONS) IMPLANT

## 2023-10-24 NOTE — Transfer of Care (Signed)
Immediate Anesthesia Transfer of Care Note  Patient: Daniel Butler  Procedure(s) Performed: EXPLORATORY LAPAROTOMY (Abdomen) COLON RESECTION (Abdomen)  Patient Location: PACU and ICU  Anesthesia Type:General  Level of Consciousness: sedated and Patient remains intubated per anesthesia plan  Airway & Oxygen Therapy: Patient remains intubated per anesthesia plan and Patient placed on Ventilator (see vital sign flow sheet for setting)  Post-op Assessment: Report given to RN and Post -op Vital signs reviewed and stable  Post vital signs: Reviewed and stable  Last Vitals:  Vitals Value Taken Time  BP 85/69 10/24/23 1805  Temp    Pulse    Resp 16 10/24/23 1806  SpO2    Vitals shown include unfiled device data.  Last Pain:  Vitals:   10/24/23 1410  TempSrc: Rectal  PainSc:          Complications: No notable events documented.

## 2023-10-24 NOTE — H&P (Signed)
NAME:  Daniel Butler, MRN:  [id number], DOB:  01-16-1966, LOS: 0 ADMISSION DATE:  10/24/2023, CONSULTATION DATE:  10/24/2023 REFERRING MD:  Dr. Bedelia Person - CCS, CHIEF COMPLAINT:  Bowel perf   History of Present Illness:  Daniel Butler is a 58 y.o. with a past medical history significant for orthostatic hypotension, chronic diastolic congestive heart failure, persistent atrial fibrillation on Eliquis, CKD stage IIIa, anemia of chronic disease, severe alcohol use disorder, chronic ulcer of the left foot, prior SAH secondary to aneurysm s/p clipping, alcohol use, and depression who presented to the ED 2/14 from [facility] with complaints of abdominal pain and swelling with associated nausea and small-volume emesis that began day prior to admission.  States he has felt unwell x 3 days.  On ED arrival patient was seen febrile with temperature 103.4, tachypneic, and tachycardic.  Lab work significant for K2.4, glucose 116, creatinine 3.12, anion gap 26, alkaline phosphate 33, albumin 2.5, lactic 10.3, WBC 19.6.  CT abdomen and pelvis obtained which revealed substantial free concerning for acute bowel perforation.  Surgery consulted and patient underwent exploratory laparotomy which revealed diffuse colonic distention with ischemia and gangrenous colonic necrosis with scattered areas of perforation and massive flocculent peritonitis.  Postprocedure patient remained intubated and sedated, PCCM consulted for further management admission.  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    Interim History / Subjective:  As above  Objective   Blood pressure (!) 85/69, pulse 65, temperature 98.2 F (36.8 C), temperature source Axillary, resp. rate 16, height 6\' 1"  (1.854 m), weight 106.6 kg, SpO2 100%.    Vent Mode: PRVC FiO2 (%):  [60 %-100 %] 60 % Set Rate:  [16 bmp] 16 bmp Vt Set:  [500 mL-640 mL] 640 mL PEEP:  [5 cmH20] 5 cmH20 Plateau Pressure:  [16 cmH20] 16 cmH20   Intake/Output Summary (Last 24 hours) at 10/24/2023 1844 Last data filed at 10/24/2023 1829 Gross per 24 hour  Intake 5200 ml  Output 400 ml  Net 4800 ml   Filed Weights   10/24/23 1226  Weight: 106.6 kg    Examination: General: Acute on chronically ill appearing elderly  male lying in bed on mechanical ventilation, in NAD HEENT: ETT, MM pink/moist, PERRL,  Neuro: Sedated on vent  CV: s1s2 regular rate and rhythm, no murmur, rubs, or gallops,  PULM:  Clear to auscultation, no increased work of breathing, no added breath sounds  GI: soft, bowel sounds active in all 4 quadrants, non-tender, non-distended Extremities: warm/dry, no edema  Skin: no rashes or lesions   Resolved Hospital Problem list     Assessment & Plan:  Septic shock in the setting of bowel perforation  -Ex lap 2/14 revealed diffuse colonic distention with ischemia and gangrenous colonic necrosis with scattered areas of perforation and massive flocculent peritonitis.   P: Primary management per surgery  Start Cefepime, Flagyl, Vancomycin, and Micofungin  Follow Wound van output  Continue pressors for MAP goal > 65  Follow cultures  Trend lactic acid  Strict NPO no meds  Tentative plan to return to OR 2/16, no plans to  extubate prior to return to OR   Postoperative ventilator management   -Left intubated post procedure given worsening shock P: Continue ventilator support with lung protective strategies  Wean PEEP and FiO2 for sats greater than 90%. Head of bed elevated 30 degrees. Plateau pressures less than 30 cm H20.  Follow intermittent chest x-ray and ABG.   SAT/SBT as tolerated, mentation preclude extubation  Ensure adequate pulmonary hygiene  Follow cultures  VAP bundle in place  PAD protocol  AKI superimposed on CKD stage IIIa -Creatinine on admission 3.12 with GFR 22 P: Follow renal function  Monitor urine output Trend Bmet Avoid nephrotoxins Ensure adequate renal perfusion  IV hydration provided on admission   Chronic diastolic congestive heart failure -Most recent EF January 2025 60-65% improved from 45-50% April 2024 Persistent atrial fibrillation on Eliquis P: Hold home Eliquis  Continuous telemetry  Strict intake and output  Daily weight to assess volume status Daily assessment for need to diurese  Closely monitor renal function and electrolytes   Anemia of chronic disease, P: Trend CBC  Transfuse per protocol  Hgb goal > 7  Hyponatremia  P: Supplement   At risk malnutrition  P:  Monitor for need of TPN   Best Practice (right click and "Reselect all SmartList Selections" daily)   Diet/type: NPO DVT prophylaxis SCD Pressure ulcer(s): N/A GI prophylaxis: N/A Lines: Central line Foley:  Yes, and it is still needed Code Status:  full code Last date of multidisciplinary goals of care discussion: Continue to update family daily   Labs   CBC: Recent Labs  Lab 10/24/23 1235 10/24/23 1244  WBC 19.6*  --   NEUTROABS 17.3*  --   HGB 14.4 16.0  HCT 47.7 47.0  MCV 87.0  --   PLT 724*  --     Basic Metabolic Panel: Recent Labs  Lab 10/24/23 1235 10/24/23 1244  NA 137 136  K 2.4* 2.4*  CL 92* 95*  CO2 19*  --   GLUCOSE 116* 119*  BUN 28* 31*   CREATININE 3.12* 2.80*  CALCIUM 8.4*  --   MG 2.2  --    GFR: Estimated Creatinine Clearance: 36.9 mL/min (A) (by C-G formula based on SCr of 2.8 mg/dL (H)). Recent Labs  Lab 10/24/23 1235 10/24/23 1244  WBC 19.6*  --   LATICACIDVEN  --  10.3*    Liver Function Tests: Recent Labs  Lab 10/24/23 1235  AST 38  ALT 10  ALKPHOS 33*  BILITOT 1.1  PROT 6.2*  ALBUMIN 2.5*   No results for input(s): "LIPASE", "AMYLASE" in the last 168 hours. Recent Labs  Lab 10/24/23 1249  AMMONIA 43*    ABG    Component Value Date/Time   PHART 7.316 (L) 03/03/2015 0405   PCO2ART 43.6 03/03/2015 0405   PO2ART 94.6 03/03/2015 0405   HCO3 28.7 (H) 12/19/2020 1154   TCO2 23 10/24/2023 1244   ACIDBASEDEF 3.6 (H) 03/03/2015 0405   O2SAT 63.0 12/19/2020 1154     Coagulation Profile: Recent Labs  Lab 10/24/23 1235  INR 4.0*    Cardiac Enzymes: No results for input(s): "CKTOTAL", "CKMB", "CKMBINDEX", "TROPONINI" in the last 168 hours.  HbA1C: Hgb A1c MFr Bld  Date/Time Value Ref Range Status  02/22/2019 02:44 PM 5.6 4.6 -  6.5 % Final    Comment:    Glycemic Control Guidelines for People with Diabetes:Non Diabetic:  <6%Goal of Therapy: <7%Additional Action Suggested:  >8%   07/24/2017 02:14 PM 5.1 4.6 - 6.5 % Final    Comment:    Glycemic Control Guidelines for People with Diabetes:Non Diabetic:  <6%Goal of Therapy: <7%Additional Action Suggested:  >8%     CBG: No results for input(s): "GLUCAP" in the last 168 hours.  Review of Systems:   Unable to assess   Past Medical History:  He,  has a past medical history of Brain aneurysm, Chicken pox, Depression, Elevated LFTs, Gout, Hernia of abdominal cavity (11/2020), High cholesterol, and Hypertension.   Surgical History:   Past Surgical History:  Procedure Laterality Date   BIOPSY  09/02/2023   Procedure: BIOPSY;  Surgeon: Jenel Lucks, MD;  Location: [facility] ENDOSCOPY;  Service: Gastroenterology;;   CARDIOVERSION N/A  01/22/2021   Procedure: CARDIOVERSION;  Surgeon: Meriam Sprague, MD;  Location: [facility] ENDOSCOPY;  Service: Cardiovascular;  Laterality: N/A;   COLONOSCOPY N/A 09/02/2023   Procedure: COLONOSCOPY;  Surgeon: Jenel Lucks, MD;  Location: [facility] ENDOSCOPY;  Service: Gastroenterology;  Laterality: N/A;   CRANIOTOMY Right 03/02/2015   Procedure: CRANIOTOMY INTRACRANIAL  ANEURYSM FOR CLIPPING;  Surgeon: Lisbeth Renshaw, MD;  Location: [facility] NEURO ORS;  Service: Neurosurgery;  Laterality: Right;   ESOPHAGOGASTRODUODENOSCOPY N/A 09/02/2023   Procedure: ESOPHAGOGASTRODUODENOSCOPY (EGD);  Surgeon: Jenel Lucks, MD;  Location: [facility] ENDOSCOPY;  Service: Gastroenterology;  Laterality: N/A;   HEMOSTASIS CLIP PLACEMENT  09/02/2023   Procedure: HEMOSTASIS CLIP PLACEMENT;  Surgeon: Jenel Lucks, MD;  Location: [facility] ENDOSCOPY;  Service: Gastroenterology;;   IR GENERIC HISTORICAL  02/16/2016   IR ANGIO VERTEBRAL SEL VERTEBRAL BILAT MOD SED 02/16/2016 Lisbeth Renshaw, MD [facility]-INTERV RAD   IR GENERIC HISTORICAL  02/16/2016   IR ANGIO INTRA EXTRACRAN SEL INTERNAL CAROTID BILAT MOD SED 02/16/2016 Lisbeth Renshaw, MD [facility]-INTER[facility] RAD   POLYPECTOMY  09/02/2023   Procedure: POLYPECTOMY;  Surgeon: Jenel Lucks, MD;  Location: [facility] ENDOSCOPY;  Service: Gastroenterology;;   RADIOLOGY WITH ANESTHESIA N/A 03/02/2015   Procedure: RADIOLOGY WITH ANESTHESIA;  Surgeon: Lisbeth Renshaw, MD;  Location: [facility] OR;  Service: Radiology;  Laterality: N/A;   RIGHT/LEFT HEART CATH AND CORONARY ANGIOGRAPHY N/A 12/19/2020   Procedure: RIGHT/LEFT HEART CATH AND CORONARY ANGIOGRAPHY;  Surgeon: Swaziland, Peter M, MD;  Location: [facility] INVASIVE CV LAB;  Service: Cardiovascular;  Laterality: N/A;   WISDOM TOOTH EXTRACTION       Social History:   reports that he has quit smoking. His smoking use included cigarettes. He has a 62.5 pack-year smoking history. He has never used smokeless tobacco. He reports that he does not currently use alcohol  after a past usage of about 70.0 standard drinks of alcohol per week. He reports that he does not use drugs.   Family History:  His family history is not on file. He was adopted.   Allergies Allergies  Allergen Reactions   Zetia [Ezetimibe] Nausea And Vomiting     Home Medications  Prior to Admission medications   Medication Sig Start Date End Date Taking? Authorizing Provider  acetaminophen (TYLENOL) 325 MG tablet Take 2 tablets (650 mg total) by mouth every 6 (six) hours as needed for mild pain (pain score 1-3) or moderate pain (pain score 4-6). 09/23/23   Arrien, York Ram, MD  allopurinol (ZYLOPRIM) 100 MG tablet Take 1 tablet (100 mg total) by mouth daily. 06/16/23   Ria Clock  Margarita Grizzle, FNP  ALPRAZolam Prudy Feeler) 0.5 MG tablet Take 1 tablet (0.5 mg total) by mouth 2 (two) times daily as needed for anxiety. 09/25/23   Zannie Cove, MD  apixaban (ELIQUIS) 5 MG TABS tablet Take 1 tablet (5 mg total) by mouth 2 (two) times daily. 01/10/23   Hilty, Lisette Abu, MD  diltiazem (CARDIZEM CD) 240 MG 24 hr capsule Take 1 capsule (240 mg total) by mouth daily. 09/26/23   Zannie Cove, MD  empagliflozin (JARDIANCE) 10 MG TABS tablet Take 1 tablet (10 mg total) by mouth daily. 09/24/23   Arrien, York Ram, MD  fenofibrate [facility]) 145 MG tablet Take 1 tablet by mouth once daily 10/15/23   Olive Bass, FNP  folic acid (FOLVITE) 1 MG tablet Take 1 tablet (1 mg total) by mouth daily. 06/16/23   Olive Bass, FNP  furosemide (LASIX) 40 MG tablet Take 1 tablet by mouth once daily 07/23/23   Hilty, Lisette Abu, MD  icosapent Ethyl (VASCEPA) 1 g capsule Take 2 capsules (2 g total) by mouth 2 (two) times daily. 03/25/23   Hilty, Lisette Abu, MD  metoprolol succinate (TOPROL-XL) 50 MG 24 hr tablet Take 1 tablet (50 mg total) by mouth daily. Take with or immediately following a meal. 07/22/23   Hilty, Lisette Abu, MD  Multiple Vitamin (MULTIVITAMIN WITH MINERALS) TABS tablet Take 1 tablet  by mouth daily. 09/24/23   Arrien, York Ram, MD  pantoprazole (PROTONIX) 40 MG tablet Take 1 tablet (40 mg total) by mouth 2 (two) times daily. 09/03/23   Narda Bonds, MD  polyethylene glycol (MIRALAX / GLYCOLAX) 17 g packet Take 17 g by mouth 2 (two) times daily. 09/23/23   Arrien, York Ram, MD  rosuvastatin (CRESTOR) 40 MG tablet Take 1 tablet (40 mg total) by mouth daily. 07/22/23   Chrystie Nose, MD  thiamine (VITAMIN B-1) 100 MG tablet Take 100 mg by mouth daily.    [provider]  traZODone (DESYREL) 50 MG tablet Take 0.5-1 tablets (25-50 mg total) by mouth at bedtime as needed for sleep. Patient taking differently: Take 50 mg by mouth at bedtime as needed for sleep. 09/11/23   Olive Bass, FNP     Critical care time:   CRITICAL CARE Performed by: Donaciano Range D. Harris   Total critical care time: 45 minutes  Critical care time was exclusive of separately billable procedures and treating other patients.  Critical care was necessary to treat or prevent imminent or life-threatening deterioration.  Critical care was time spent personally by me on the following activities: development of treatment plan with patient and/or surrogate as well as nursing, discussions with consultants, evaluation of patient's response to treatment, examination of patient, obtaining history from patient or surrogate, ordering and performing treatments and interventions, ordering and review of laboratory studies, ordering and review of radiographic studies, pulse oximetry and re-evaluation of patient's condition.  Randy Whitener D. Harris, NP-C [facility] Personal contact information can be found on Amion  If no contact or response made please call [phone] 10/24/2023, 6:44 PM

## 2023-10-24 NOTE — ED Notes (Signed)
Pt rectal temp obtained - 101.5 - No needs at this time. Call light in reach.

## 2023-10-24 NOTE — ED Triage Notes (Signed)
Patient presents to ed via [facility] from [facility]. States he has been there since Jan.  C/o abd. Pain and swelling onset yest. States he hasn't felt well for 3 days. C/o nausea and vomiting , states he hasn't eaten or been able to take his medicines for 3 days. Patient is alert oriented , states he is at the rehab for gait strengthening.

## 2023-10-24 NOTE — Op Note (Signed)
   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 year old male with penumoperitoneum     Surgeon: Diamantina Monks, MD  Anesthesiologist: Desmond Lope, MD Anesthesia: General  Findings:  Specimen: colon EBL: 50cc Drains/Implants: none  Disposition: ICU - intubated and critically ill.  Description of procedure: The patient was positioned supine on the operating room table. General anesthetic induction and intubation were uneventful. Foley catheter insertion was performed and was atraumatic. Time-out was performed verifying correct patient, procedure, signature of informed consent, and administration of pre-operative antibiotics. The abdomen was prepped and draped in the usual sterile fashion.  A midline incision was made and deepened through the fascia until the peritoneal cavity was entered. Feculent peritonitis was immediately encountered and ischemic colon was visualized.  Abdomen was explored revealing diffuse colonic dilatation to the level of the peritoneal reflection.  The colon appeared ischemic to the level of the mid sigmoid colon.  The terminal ileum was transected distally.  The colon was resected to the level of viable appearing bowel at the mid sigmoid colon and the LigaSure used for vascular control.  The abdomen was copiously irrigated with over 25 L of saline due to the massive feculent peritonitis.  An ABThera wound VAC was applied as sterile dressing.  All sponge and instrument counts were correct at the conclusion of the procedure. The patient was awakened from transported to the ICU in critical condition.  There were no complications.   Upon entering the abdomen (organ space), I encountered feculent peritonitis.  CASE DATA:  Type of patient?: DOW CASE (Surgical Hospitalist [facility] Inpatient)  Status of Case? EMERGENT Add On  Infection Present At Time Of Surgery (PATOS)?  FECULENT PERITONITIS    Diamantina Monks, MD General and Trauma Surgery [facility] Surgery

## 2023-10-24 NOTE — Progress Notes (Signed)
Pharmacy Antibiotic Note  Daniel Butler is a 58 y.o. male admitted on 10/24/2023 with abdominal sepsis - bowel perforation.  Pharmacy has been consulted for Cefepime and Vancomycin dosing. Pt also has Flagyl and Micafungin ordered.  S/p ex lap 2/14 revealed diffuse colonic distention with ischemia and gangrenous colonic necrosis with scattered areas of perforation and massive flocculent peritonitis   Noted pt with AKI - SCr up to 2.8 today (was 1.7 ~1 mos ago)  Plan: D/c Zosyn Cefepime 2gm IV q12h Vancomycin 2000mg  IV now  Will f/u SCr in a.m. for further vanc dosing Continue Flagyl and Micafungin as ordered Will f/u renal function, micro data, and pt's clinical condition Vanc levels prn   Height: 6\' 1"  (185.4 cm) Weight: 106.6 kg (235 lb) IBW/kg (Calculated) : 79.9  Temp (24hrs), Avg:101 F (38.3 C), Min:98.2 F (36.8 C), Max:103.4 F (39.7 C)  Recent Labs  Lab 10/24/23 1235 10/24/23 1244  WBC 19.6*  --   CREATININE 3.12* 2.80*  LATICACIDVEN  --  10.3*    Estimated Creatinine Clearance: 36.9 mL/min (A) (by C-G formula based on SCr of 2.8 mg/dL (H)).    Allergies  Allergen Reactions   Zetia [Ezetimibe] Nausea And Vomiting    Antimicrobials this admission: 2/14 Cefepime>>  2/14 Vanc >>  2/14 Flagyl >> 2/14 Micafungin >>  Microbiology results: 2/14 BCx:  Trach asp:  MRSA PCR:   Thank you for allowing pharmacy to be a part of this patient's care.  Christoper Fabian, PharmD, BCPS Please see amion for complete clinical pharmacist phone list 10/24/2023 6:48 PM

## 2023-10-24 NOTE — Sepsis Progress Note (Signed)
Sepsis protocol monitored by eLink ?

## 2023-10-24 NOTE — ED Provider Notes (Signed)
4 days of abd pain with fever and chills  CT shows perforated bowel.  Gen surg plan to take pt to the OR.    Fayrene Helper, PA-C 10/26/23 1501    Lonell Grandchild, MD 10/26/23 [phone]

## 2023-10-24 NOTE — ED Notes (Signed)
Pt attempting to use urinal. Spouse at bedside. Covid swab obtained. Call light in reach.

## 2023-10-24 NOTE — ED Provider Notes (Signed)
[place] EMERGENCY DEPARTMENT AT [facility] Provider Note   CSN: [id number] Arrival date & time: 10/24/23  1205     History  Chief Complaint  Patient presents with   Abdominal Pain    Daniel Butler is a 58 y.o. male with past medical history of alcohol use disorder, chronic kidney disease, persistent A-fib on Eliquis, congestive heart failure, umbilical hernia, hypertension, intracranial hemorrhage presenting to emergency room with complaint of 4 days of abdominal pain.  Patient reports today he woke up and he had 10 out of 10 abdominal pain associated with nausea and vomiting.  He feels like he needs to have a bowel movement.  Reports he had some small amount of diarrhea.  Reports he has been "feeling unwell" all week.  Denies any chest pain, shortness of breath, cough.  Denies any bilateral lower extremity edema.  Has not taken any medication since 3-4 days ago due to illness. Last BM 2-3 days ago. No blood in stool reported.    Abdominal Pain      Home Medications Prior to Admission medications   Medication Sig Start Date End Date Taking? Authorizing Provider  acetaminophen (TYLENOL) 325 MG tablet Take 2 tablets (650 mg total) by mouth every 6 (six) hours as needed for mild pain (pain score 1-3) or moderate pain (pain score 4-6). 09/23/23   Arrien, York Ram, MD  allopurinol (ZYLOPRIM) 100 MG tablet Take 1 tablet (100 mg total) by mouth daily. 06/16/23   Olive Bass, FNP  ALPRAZolam Prudy Feeler) 0.5 MG tablet Take 1 tablet (0.5 mg total) by mouth 2 (two) times daily as needed for anxiety. 09/25/23   Zannie Cove, MD  apixaban (ELIQUIS) 5 MG TABS tablet Take 1 tablet (5 mg total) by mouth 2 (two) times daily. 01/10/23   Hilty, Lisette Abu, MD  diltiazem (CARDIZEM CD) 240 MG 24 hr capsule Take 1 capsule (240 mg total) by mouth daily. 09/26/23   Zannie Cove, MD  empagliflozin (JARDIANCE) 10 MG TABS tablet Take 1 tablet (10 mg total) by mouth daily.  09/24/23   Arrien, York Ram, MD  fenofibrate [facility]) 145 MG tablet Take 1 tablet by mouth once daily 10/15/23   Olive Bass, FNP  folic acid (FOLVITE) 1 MG tablet Take 1 tablet (1 mg total) by mouth daily. 06/16/23   Olive Bass, FNP  furosemide (LASIX) 40 MG tablet Take 1 tablet by mouth once daily 07/23/23   Hilty, Lisette Abu, MD  icosapent Ethyl (VASCEPA) 1 g capsule Take 2 capsules (2 g total) by mouth 2 (two) times daily. 03/25/23   Hilty, Lisette Abu, MD  metoprolol succinate (TOPROL-XL) 50 MG 24 hr tablet Take 1 tablet (50 mg total) by mouth daily. Take with or immediately following a meal. 07/22/23   Hilty, Lisette Abu, MD  Multiple Vitamin (MULTIVITAMIN WITH MINERALS) TABS tablet Take 1 tablet by mouth daily. 09/24/23   Arrien, York Ram, MD  pantoprazole (PROTONIX) 40 MG tablet Take 1 tablet (40 mg total) by mouth 2 (two) times daily. 09/03/23   Narda Bonds, MD  polyethylene glycol (MIRALAX / GLYCOLAX) 17 g packet Take 17 g by mouth 2 (two) times daily. 09/23/23   Arrien, York Ram, MD  rosuvastatin (CRESTOR) 40 MG tablet Take 1 tablet (40 mg total) by mouth daily. 07/22/23   Chrystie Nose, MD  thiamine (VITAMIN B-1) 100 MG tablet Take 100 mg by mouth daily.    [provider]  traZODone (DESYREL) 50 MG  tablet Take 0.5-1 tablets (25-50 mg total) by mouth at bedtime as needed for sleep. Patient taking differently: Take 50 mg by mouth at bedtime as needed for sleep. 09/11/23   Olive Bass, FNP      Allergies    Zetia [ezetimibe]    Review of Systems   Review of Systems  Gastrointestinal:  Positive for abdominal pain.    Physical Exam Updated Vital Signs BP 100/69 (BP Location: Right Arm) Comment: Simultaneous filing. User may not have seen previous data. Comment (BP Location): Simultaneous filing. User may not have seen previous data.  Pulse (!) 168 Comment: Simultaneous filing. User may not have seen previous data.  Temp (!)  103.4 F (39.7 C) (Rectal) Comment: Simultaneous filing. User may not have seen previous data.  Resp 20 Comment: Simultaneous filing. User may not have seen previous data.  Ht 6\' 1"  (1.854 m)   Wt 106.6 kg   SpO2 97% Comment: Simultaneous filing. User may not have seen previous data.  BMI 31.00 kg/m  Physical Exam Vitals and nursing note reviewed.  Constitutional:      General: He is not in acute distress.    Appearance: He is not toxic-appearing.  HENT:     Head: Normocephalic and atraumatic.  Eyes:     General: No scleral icterus.    Conjunctiva/sclera: Conjunctivae normal.  Cardiovascular:     Rate and Rhythm: Normal rate and regular rhythm.     Pulses: Normal pulses.     Heart sounds: Normal heart sounds.  Pulmonary:     Effort: Pulmonary effort is normal. No respiratory distress.     Breath sounds: Normal breath sounds.  Abdominal:     General: Abdomen is flat. Bowel sounds are normal. There is distension.     Palpations: Abdomen is soft.     Tenderness: There is abdominal tenderness in the right upper quadrant, right lower quadrant, epigastric area, periumbilical area and suprapubic area.     Hernia: A hernia is present.  Skin:    General: Skin is warm and dry.     Findings: No lesion.  Neurological:     General: No focal deficit present.     Mental Status: He is alert and oriented to person, place, and time. Mental status is at baseline.     ED Results / Procedures / Treatments   Labs (all labs ordered are listed, but only abnormal results are displayed) Labs Reviewed  COMPREHENSIVE METABOLIC PANEL - Abnormal; Notable for the following components:      Result Value   Potassium 2.4 (*)    Chloride 92 (*)    CO2 19 (*)    Glucose, Bld 116 (*)    BUN 28 (*)    Creatinine, Ser 3.12 (*)    Calcium 8.4 (*)    Total Protein 6.2 (*)    Albumin 2.5 (*)    Alkaline Phosphatase 33 (*)    GFR, Estimated 22 (*)    Anion gap 26 (*)    All other components within  normal limits  CBC WITH DIFFERENTIAL/PLATELET - Abnormal; Notable for the following components:   WBC 19.6 (*)    RDW 22.0 (*)    Platelets 724 (*)    Neutro Abs 17.3 (*)    Monocytes Absolute 1.3 (*)    Abs Immature Granulocytes 0.10 (*)    All other components within normal limits  PROTIME-INR - Abnormal; Notable for the following components:   Prothrombin Time 39.4 (*)  INR 4.0 (*)    All other components within normal limits  APTT - Abnormal; Notable for the following components:   aPTT 38 (*)    All other components within normal limits  URINALYSIS, W/ REFLEX TO CULTURE (INFECTION SUSPECTED) - Abnormal; Notable for the following components:   Color, Urine AMBER (*)    Glucose, UA 150 (*)    All other components within normal limits  AMMONIA - Abnormal; Notable for the following components:   Ammonia 43 (*)    All other components within normal limits  I-STAT CG4 LACTIC ACID, ED - Abnormal; Notable for the following components:   Lactic Acid, Venous 10.3 (*)    All other components within normal limits  I-STAT CHEM 8, ED - Abnormal; Notable for the following components:   Potassium 2.4 (*)    Chloride 95 (*)    BUN 31 (*)    Creatinine, Ser 2.80 (*)    Glucose, Bld 119 (*)    Calcium, Ion 0.89 (*)    All other components within normal limits  RESP PANEL BY RT-PCR (RSV, FLU A&B, COVID)  RVPGX2  CULTURE, BLOOD (ROUTINE X 2)  CULTURE, BLOOD (ROUTINE X 2)  MAGNESIUM  I-STAT CG4 LACTIC ACID, ED    EKG EKG Interpretation Date/Time:  Friday October 24 2023 12:12:30 EST Ventricular Rate:  173 PR Interval:    QRS Duration:  99 QT Interval:  247 QTC Calculation: 419 R Axis:   -29  Text Interpretation: Atrial fibrillation with rapid V-rate Inferoposterior infarct, recent Lateral leads are also involved Confirmed by Alvino Blood ([id number]) on 10/24/2023 12:49:40 PM  Radiology CT ABDOMEN PELVIS WO CONTRAST Result Date: 10/24/2023 CLINICAL DATA:  Right lower quadrant  abdominal pain and sepsis EXAM: CT ABDOMEN AND PELVIS WITHOUT CONTRAST TECHNIQUE: Multidetector CT imaging of the abdomen and pelvis was performed following the standard protocol without IV contrast. RADIATION DOSE REDUCTION: This exam was performed according to the departmental dose-optimization program which includes automated exposure control, adjustment of the mA and/or kV according to patient size and/or use of iterative reconstruction technique. COMPARISON:  CT scan 08/21/2023 FINDINGS: Lower chest: Trace bilateral pleural effusions. Mild cardiomegaly. Prominent epicardial adipose tissue. Hepatobiliary: Unremarkable Pancreas: Unremarkable Spleen: Unremarkable Adrenals/Urinary Tract: Unremarkable Stomach/Bowel: Diffuse dilated colon. Substantial free intraperitoneal gas with ascites in the upper abdomen and high density/complex ascites in the pelvis with air-fluid level, cannot exclude hemoperitoneum in addition to the pneumoperitoneum. Exact site of perforation not well seen although colonic etiology favored. No dilated small bowel. No obvious perforation along the stomach or duodenum. There is some clustered free intraperitoneal gas adjacent to the cecum. Frothy luminal contents and gas in the colon. Vascular/Lymphatic: Mild atheromatous vascular disease. Reproductive: No supplemental non-categorized findings. Other: Presacral edema. Musculoskeletal: Umbilical hernia containing pneumoperitoneum. IMPRESSION: 1. Substantial free intraperitoneal gas with ascites in the upper abdomen and high density/complex ascites in the pelvis with air-fluid level, cannot exclude hemoperitoneum in addition to the pneumoperitoneum. Exact site of perforation not well seen although colonic etiology favored given the diffuse colonic distension. 2. Diffuse dilated colon with frothy luminal contents and gas. 3. Umbilical hernia containing pneumoperitoneum. 4. Trace bilateral pleural effusions. 5. Mild cardiomegaly. 6. Mild  atheromatous vascular disease. 7. Presacral edema. Critical Value/emergent results were called by telephone at the time of interpretation on 10/24/2023 at 2:45 pm to provider [facility] , who verbally acknowledged these results. Electronically Signed   By: Gaylyn Rong M.D.   On: 10/24/2023 14:45    Procedures .  Critical Care  Performed by: Smitty Knudsen, PA-C Authorized by: Smitty Knudsen, PA-C   Critical care provider statement:    Critical care time (minutes):  40   Critical care time was exclusive of:  Separately billable procedures and treating other patients   Critical care was necessary to treat or prevent imminent or life-threatening deterioration of the following conditions:  Shock, sepsis, metabolic crisis and dehydration   Critical care was time spent personally by me on the following activities:  Blood draw for specimens, development of treatment plan with patient or surrogate, discussions with consultants, evaluation of patient's response to treatment, examination of patient, review of old charts, re-evaluation of patient's condition, pulse oximetry, ordering and review of radiographic studies, ordering and review of laboratory studies and ordering and performing treatments and interventions   I assumed direction of critical care for this patient from another provider in my specialty: yes     Care discussed with: admitting provider       Medications Ordered in ED Medications  lactated ringers infusion (has no administration in time range)  ceFEPIme (MAXIPIME) 2 g in sodium chloride 0.9 % 100 mL IVPB (has no administration in time range)  metroNIDAZOLE (FLAGYL) IVPB 500 mg (has no administration in time range)  lactated ringers bolus 1,000 mL (has no administration in time range)  lactated ringers bolus 500 mL (has no administration in time range)  acetaminophen (TYLENOL) suppository 650 mg (has no administration in time range)    ED Course/ Medical Decision  Making/ A&P Clinical Course as of 10/24/23 1519  Fri Oct 24, 2023  1450 Gen Surg, Marisue Ivan PA, in room now.  [JB]    Clinical Course User Index [JB] Kamaile Zachow, Horald Chestnut, PA-C                                 Medical Decision Making Amount and/or Complexity of Data Reviewed Labs: ordered. Radiology: ordered.  Risk OTC drugs. Prescription drug management.   Denese Killings 58 y.o. presented today for abd pain. Working DDx includes, but not limited to, gastroenteritis, colitis, SBO, appendicitis, cholecystitis, hepatobiliary pathology, gastritis, PUD, ACS, dissection, pancreatitis, nephrolithiasis, AAA, UTI, pyelonephritis, torsion.   R/o DDx: These are considered less likely than current impression due to history of present illness, physical exam, labs/imaging findings.  Review of prior external notes: None  Pmhx: CHF, afib   Unique Tests and My Interpretation:  EKG showing afib rvr likely in secondary to febrile illness/sepsis    Imaging:  CT Abd/Pelvis with contrast: evaluate for structural/surgical etiology of patients' severe abdominal pain - free air in pelvis and ascites likely related to colonic dilation and perforation. Called General surgery.    Problem List / ED Course / Critical interventions / Medication management  Patient arriving to emergency room with 1 week of feeling unwell, fevers, chills, muscle aches and four days of abdominal pain nausea vomiting.  He reports decreased bowel movement.  He is febrile to 97 F with soft blood pressure and rapid heart rate.  Patient's EKG shows A-fib RVR. Ordered rectal Tylenol and fluids. Upon initial assessment, code medical and code sepsis were called.  Started fluid bolus for ideal body weight and ordered antibiotics with presumed intra-abdominal origin of sepsis.  Patient denied any chest pain, shortness of breath.  On exam umbilical hernia without any sign of necrosis or strangulation.  Abdomen appears distended, and is very  tender.  There is concern for abdominal perforation versus obstruction.  Due to Chem-8 with elevated creatinine obtained CT without contrast.  Upon reviewing CT scan called radiology for concern of perforation. Leukocytosis of 19.2 Patient's lactic was 10.3, repeat was not obtained as patient was taken to surgery.  Potassium 2.4, ordered several rounds of IV potassium and ordered magnesium level.  Patient with significant AKI, anion gap which I feel is likely secondary to significant dehydration and complication with patient's abdominal perforation.  Blood cultures pending.  UA negative for nitrites, negative for leukocytes.  I ordered medication including hydromorphone, cefepime and flagyl for sepsis suspected due to intra-abdominal origin  Reevaluation of the patient after these medicines showed that the patient improved Patients vitals assessed. Upon arrival patient is hypotensive, febrile and tachycardic.  Patient is in A-fib RVR.  Given SIRS criteria on arrival.  Will call code sepsis.  Ordering fluids, blood cultures lactic and imaging of CT T abdomen and pelvis given abdominal pain.  Also ordering antibiotics for suspected intra-abdominal pathology being the cause. I have reviewed the patients home medicines and have made adjustments as needed currently.  General surgery assessed patient and recommended going to OR.   Consult: General surgery.   Plan: Patient going to OR with General Surgery. Spoke to [facility], they will be taking him now.          Final Clinical Impression(s) / ED Diagnoses Final diagnoses:  None    Rx / DC Orders ED Discharge Orders     None         Smitty Knudsen, PA-C 10/24/23 1801    Lonell Grandchild, MD 10/25/23 [phone]

## 2023-10-24 NOTE — Sepsis Progress Note (Signed)
Notified bedside nurse of need to draw repeat lactic acid.

## 2023-10-24 NOTE — Anesthesia Preprocedure Evaluation (Addendum)
Anesthesia Evaluation  Patient identified by MRN, date of birth, ID band Patient awake    Reviewed: Allergy & Precautions, NPO status , Patient's Chart, lab work & pertinent test results, reviewed documented beta blocker date and time , Unable to perform ROS - Chart review onlyPreop documentation limited or incomplete due to emergent nature of procedure.  Airway Mallampati: II  TM Distance: >3 FB Neck ROM: Full    Dental  (+) Dental Advisory Given, Missing, Chipped, Poor Dentition   Pulmonary Patient abstained from smoking., former smoker   Pulmonary exam normal breath sounds clear to auscultation       Cardiovascular hypertension, Pt. on medications and Pt. on home beta blockers +CHF  + dysrhythmias Atrial Fibrillation  Rhythm:Irregular Rate:Tachycardia     Neuro/Psych  Headaches PSYCHIATRIC DISORDERS Anxiety Depression    SAH s/p clipping   Neuromuscular disease    GI/Hepatic ,GERD  Medicated,,(+)     substance abuse  alcohol usePeritonitis   Endo/Other  negative endocrine ROS  Obesity   Renal/GU Renal InsufficiencyRenal disease     Musculoskeletal negative musculoskeletal ROS (+)    Abdominal   Peds  Hematology  (+) Blood dyscrasia (eliquis)   Anesthesia Other Findings Day of surgery medications reviewed with the patient.  Reproductive/Obstetrics                             Anesthesia Physical Anesthesia Plan  ASA: 5 and emergent  Anesthesia Plan: General   Post-op Pain Management:    Induction: Intravenous, Rapid sequence and Cricoid pressure planned  PONV Risk Score and Plan: 3 and Treatment may vary due to age or medical condition and Propofol infusion  Airway Management Planned: Oral ETT  Additional Equipment: Arterial line, CVP and Ultrasound Guidance Line Placement  Intra-op Plan:   Post-operative Plan: Post-operative intubation/ventilation  Informed Consent: I  have reviewed the patients History and Physical, chart, labs and discussed the procedure including the risks, benefits and alternatives for the proposed anesthesia with the patient or authorized representative who has indicated his/her understanding and acceptance.     Only emergency history available and History available from chart only  Plan Discussed with: CRNA  Anesthesia Plan Comments:         Anesthesia Quick Evaluation

## 2023-10-24 NOTE — Consult Note (Addendum)
Consult Note  Daniel Butler 1966-02-13  [id number].    Requesting Provider: Jasmine Pang, PA-C Chief Complaint/Reason for Consult: concern for pneumoperitoneum    HPI:  Patient is a 58 year old male with multiple medical problems who presented to the ED from SNF with 3-4 days of abdominal pain. Reports in the last 48h he has developed progressive distention, nausea, small volume mesis. Last BM was yesterday after suppository - described as loose and dark brown. Had been saying he was "feeling unwell" all week. Denies chest pain, SOB, cough.   PMH significant for Chronic diastolic CHF, Persistent A. 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 He has not been able to take his medications for several days due to feeling unwell. No prior abdominal surgery. Just had EGD and colonoscopy 09/02/23. EGD with ampullary adenoma and was being referred to [facility] for management of this. Colonoscopy with polyps throughout, negative for malignancy and moderate diverticulosis in descending and sigmoid colon. Patient recently admitted to the hospital from 09/18/23 - 09/25/23 with LE swelling and decompensated CHF. He was discharged to [facility] 09/25/23. Patient is married and prior to recent admission 09/18/23 reported drinking 2 beers and 2 shots of cognac daily and smoking 12-13 cigarettes daily.   ROS: Review of Systems  All other systems reviewed and are negative.   Family History  Adopted: Yes    Past Medical History:  Diagnosis Date   Brain aneurysm    Chicken pox    Depression    Elevated LFTs    Gout    Hernia of abdominal cavity 11/2020   High cholesterol    Hypertension     Past Surgical History:  Procedure Laterality Date   BIOPSY  09/02/2023   Procedure: BIOPSY;  Surgeon: Jenel Lucks, MD;  Location: [facility]  ENDOSCOPY;  Service: Gastroenterology;;   CARDIOVERSION N/A 01/22/2021   Procedure: CARDIOVERSION;  Surgeon: Meriam Sprague, MD;  Location: [facility] ENDOSCOPY;  Service: Cardiovascular;  Laterality: N/A;   COLONOSCOPY N/A 09/02/2023   Procedure: COLONOSCOPY;  Surgeon: Jenel Lucks, MD;  Location: [facility] ENDOSCOPY;  Service: Gastroenterology;  Laterality: N/A;   CRANIOTOMY Right 03/02/2015   Procedure: CRANIOTOMY INTRACRANIAL  ANEURYSM FOR CLIPPING;  Surgeon: Lisbeth Renshaw, MD;  Location: [facility] NEURO ORS;  Service: Neurosurgery;  Laterality: Right;   ESOPHAGOGASTRODUODENOSCOPY N/A 09/02/2023   Procedure: ESOPHAGOGASTRODUODENOSCOPY (EGD);  Surgeon: Jenel Lucks, MD;  Location: [facility] ENDOSCOPY;  Service: Gastroenterology;  Laterality: N/A;   HEMOSTASIS CLIP PLACEMENT  09/02/2023   Procedure: HEMOSTASIS CLIP PLACEMENT;  Surgeon: Jenel Lucks, MD;  Location: [facility] ENDOSCOPY;  Service: Gastroenterology;;   IR GENERIC HISTORICAL  02/16/2016   IR ANGIO VERTEBRAL SEL VERTEBRAL BILAT MOD SED 02/16/2016 Lisbeth Renshaw, MD [facility]-INTERV RAD   IR GENERIC HISTORICAL  02/16/2016   IR ANGIO INTRA EXTRACRAN SEL INTERNAL CAROTID BILAT MOD SED 02/16/2016 Lisbeth Renshaw, MD [facility]-INTER[facility] RAD   POLYPECTOMY  09/02/2023   Procedure: POLYPECTOMY;  Surgeon: Jenel Lucks, MD;  Location: [facility] ENDOSCOPY;  Service: Gastroenterology;;   RADIOLOGY WITH ANESTHESIA N/A 03/02/2015   Procedure: RADIOLOGY WITH ANESTHESIA;  Surgeon: Lisbeth Renshaw, MD;  Location: [facility] OR;  Service: Radiology;  Laterality: N/A;   RIGHT/LEFT HEART CATH AND CORONARY ANGIOGRAPHY N/A 12/19/2020   Procedure: RIGHT/LEFT HEART CATH AND CORONARY ANGIOGRAPHY;  Surgeon: Swaziland, Peter M, MD;  Location: [facility] INVASIVE CV LAB;  Service: Cardiovascular;  Laterality: N/A;   WISDOM TOOTH EXTRACTION      Social History:  reports that he has quit smoking. His smoking use included cigarettes. He has a 62.5 pack-year smoking history. He has never used smokeless  tobacco. He reports that he does not currently use alcohol after a past usage of about 70.0 standard drinks of alcohol per week. He reports that he does not use drugs.  Allergies:  Allergies  Allergen Reactions   Zetia [Ezetimibe] Nausea And Vomiting    (Not in a hospital admission)   Blood pressure 121/79, pulse (!) 178, temperature (!) 101.5 F (38.6 C), temperature source Rectal, resp. rate 20, height 6\' 1"  (1.854 m), weight 106.6 kg, SpO2 99%. Physical Exam:  General: acutely and chronically ill appearing male, appears uncomfortable HEENT: head is normocephalic, atraumatic.  Sclera are noninjected.  PERRL.  Poor dentition  Heart: irregularly irregular, HR 160's, no lower extremity edema  Lungs:  rhonchi and coarse upper airway sounds  Respiratory effort nonlabored Abd: protuberant, distended, umbilical hernia with  chronic overlying skin changes, has diffuse peritonitis on exam  MS: all 4 extremities are symmetrical with no cyanosis, clubbing, or edema. Skin: cool, dry Neuro: non-focal exam, moves all extremities  Psych: A&Ox3 with an appropriate affect.   Results for orders placed or performed during the hospital encounter of 10/24/23 (from the past 48 hours)  Comprehensive metabolic panel     Status: Abnormal   Collection Time: 10/24/23 12:35 PM  Result Value Ref Range   Sodium 137 135 - 145 mmol/L   Potassium 2.4 (LL) 3.5 - 5.1 mmol/L    Comment: CRITICAL RESULT CALLED TO, READ BACK BY AND VERIFIED WITH K. Moon RN , @1410 , 10/24/23, Dabdee,T.   Chloride 92 (L) 98 - 111 mmol/L   CO2 19 (L) 22 - 32 mmol/L   Glucose, Bld 116 (H) 70 - 99 mg/dL    Comment: Glucose reference range applies only to samples taken after fasting for at least 8 hours.   BUN 28 (H) 6 - 20 mg/dL   Creatinine, Ser 1.61 (H) 0.61 - 1.24 mg/dL   Calcium 8.4 (L) 8.9 - 10.3 mg/dL   Total Protein 6.2 (L) 6.5 - 8.1 g/dL   Albumin 2.5 (L) 3.5 - 5.0 g/dL   AST 38 15 - 41 U/L   ALT 10 0 - 44 U/L    Comment:  RESULT CONFIRMED BY MANUAL DILUTION   Alkaline Phosphatase 33 (L) 38 - 126 U/L   Total Bilirubin 1.1 0.0 - 1.2 mg/dL   GFR, Estimated 22 (L) >60 mL/min    Comment: (NOTE) Calculated using the CKD-EPI Creatinine Equation (2021)    Anion gap 26 (H) 5 - 15    Comment: ELECTROLYTES REPEATED TO VERIFY Performed at [facility], [address], [place], [place] [address]   CBC with Differential     Status: Abnormal   Collection Time: 10/24/23 12:35 PM  Result Value Ref Range   WBC 19.6 (H) 4.0 - 10.5 K/uL   RBC 5.48 4.22 - 5.81 MIL/uL   Hemoglobin 14.4 13.0 - 17.0 g/dL   HCT 54.0 98.1 - 19.1 %   MCV 87.0 80.0 - 100.0 fL   MCH 26.3 26.0 - 34.0 pg   MCHC 30.2 30.0 - 36.0 g/dL   RDW 47.8 (H) 29.5 - 62.1 %   Platelets 724 (H) 150 - 400 K/uL   nRBC 0.0 0.0 - 0.2 %  Neutrophils Relative % 87 %   Neutro Abs 17.3 (H) 1.7 - 7.7 K/uL   Lymphocytes Relative 4 %   Lymphs Abs 0.7 0.7 - 4.0 K/uL   Monocytes Relative 7 %   Monocytes Absolute 1.3 (H) 0.1 - 1.0 K/uL   Eosinophils Relative 1 %   Eosinophils Absolute 0.2 0.0 - 0.5 K/uL   Basophils Relative 0 %   Basophils Absolute 0.1 0.0 - 0.1 K/uL   WBC Morphology INCREASED BANDS (>20% BANDS)     Comment: Mild Left Shift (1-5% metas, occ myelo)   RBC Morphology See Note    Smear Review PLATELETS APPEAR INCREASED    Immature Granulocytes 1 %   Abs Immature Granulocytes 0.10 (H) 0.00 - 0.07 K/uL   Polychromasia PRESENT     Comment: Performed at [facility], [address] [place], [place] [address]  Protime-INR     Status: Abnormal   Collection Time: 10/24/23 12:35 PM  Result Value Ref Range   Prothrombin Time 39.4 (H) 11.4 - 15.2 seconds   INR 4.0 (H) 0.8 - 1.2    Comment: (NOTE) INR goal varies based on device and disease states. Performed at [facility], [address], [place], [place] [address]   APTT     Status: Abnormal   Collection Time: 10/24/23 12:35 PM  Result Value Ref Range   aPTT 38 (H) 24 - 36  seconds    Comment:        IF BASELINE aPTT IS ELEVATED, SUGGEST PATIENT RISK ASSESSMENT BE USED TO DETERMINE APPROPRIATE ANTICOAGULANT THERAPY. Performed at [facility], [address] [place], [place] [address]   I-Stat Lactic Acid, ED     Status: Abnormal   Collection Time: 10/24/23 12:44 PM  Result Value Ref Range   Lactic Acid, Venous 10.3 (HH) 0.5 - 1.9 mmol/L   Comment NOTIFIED PHYSICIAN   I-stat chem 8, ED (not at [facility], [facility] or [facility])     Status: Abnormal   Collection Time: 10/24/23 12:44 PM  Result Value Ref Range   Sodium 136 135 - 145 mmol/L   Potassium 2.4 (LL) 3.5 - 5.1 mmol/L   Chloride 95 (L) 98 - 111 mmol/L   BUN 31 (H) 6 - 20 mg/dL   Creatinine, Ser 9.14 (H) 0.61 - 1.24 mg/dL   Glucose, Bld 782 (H) 70 - 99 mg/dL    Comment: Glucose reference range applies only to samples taken after fasting for at least 8 hours.   Calcium, Ion 0.89 (LL) 1.15 - 1.40 mmol/L   TCO2 23 22 - 32 mmol/L   Hemoglobin 16.0 13.0 - 17.0 g/dL   HCT 95.6 21.3 - 08.6 %   Comment NOTIFIED PHYSICIAN   Resp panel by RT-PCR (RSV, Flu A&B, Covid) Anterior Nasal Swab     Status: None   Collection Time: 10/24/23 12:46 PM   Specimen: Anterior Nasal Swab  Result Value Ref Range   SARS Coronavirus 2 by RT PCR NEGATIVE NEGATIVE   Influenza A by PCR NEGATIVE NEGATIVE   Influenza B by PCR NEGATIVE NEGATIVE    Comment: (NOTE) The Xpert Xpress SARS-CoV-2/FLU/RSV plus assay is intended as an aid in the diagnosis of influenza from Nasopharyngeal swab specimens and should not be used as a sole basis for treatment. Nasal washings and aspirates are unacceptable for Xpert Xpress SARS-CoV-2/FLU/RSV testing.  Fact Sheet for Patients: BloggerCourse.com  Fact Sheet for Healthcare Providers: SeriousBroker.it  This test is not yet approved or  cleared by the [place] and has been authorized for detection and/or diagnosis of SARS-CoV-2 by FDA  under an Emergency Use Authorization (EUA). This EUA will remain in effect (meaning this test can be used) for the duration of the COVID-19 declaration under Section 564(b)(1) of the Act, 21 U.S.C. section 360bbb-3(b)(1), unless the authorization is terminated or revoked.     Resp Syncytial Virus by PCR NEGATIVE NEGATIVE    Comment: (NOTE) Fact Sheet for Patients: BloggerCourse.com  Fact Sheet for Healthcare Providers: SeriousBroker.it  This test is not yet approved or cleared by the [place] FDA and has been authorized for detection and/or diagnosis of SARS-CoV-2 by FDA under an Emergency Use Authorization (EUA). This EUA will remain in effect (meaning this test can be used) for the duration of the COVID-19 declaration under Section 564(b)(1) of the Act, 21 U.S.C. section 360bbb-3(b)(1), unless the authorization is terminated or revoked.  Performed at [facility], [address], [place], [place] [address]   Urinalysis, w/ Reflex to Culture (Infection Suspected) -Urine, Clean Catch     Status: Abnormal   Collection Time: 10/24/23 12:46 PM  Result Value Ref Range   Specimen Source URINE, CLEAN CATCH    Color, Urine AMBER (A) YELLOW    Comment: BIOCHEMICALS MAY BE AFFECTED BY COLOR   APPearance CLEAR CLEAR   Specific Gravity, Urine 1.016 1.005 - 1.030   pH 5.0 5.0 - 8.0   Glucose, UA 150 (A) NEGATIVE mg/dL   Hgb urine dipstick NEGATIVE NEGATIVE   Bilirubin Urine NEGATIVE NEGATIVE   Ketones, ur NEGATIVE NEGATIVE mg/dL   Protein, ur NEGATIVE NEGATIVE mg/dL   Nitrite NEGATIVE NEGATIVE   Leukocytes,Ua NEGATIVE NEGATIVE   RBC / HPF 0-5 0 - 5 RBC/hpf   WBC, UA 0-5 0 - 5 WBC/hpf    Comment:        Reflex urine culture not performed if WBC <=10, OR if Squamous epithelial cells >5. If Squamous epithelial cells >5 suggest recollection.    Bacteria, UA NONE SEEN NONE SEEN   Squamous Epithelial / HPF 0-5 0 - 5 /HPF    Mucus PRESENT    Hyaline Casts, UA PRESENT     Comment: Performed at [facility], [address], [place], [place] [address]  Ammonia     Status: Abnormal   Collection Time: 10/24/23 12:49 PM  Result Value Ref Range   Ammonia 43 (H) 9 - 35 umol/L    Comment: HEMOLYSIS AT THIS LEVEL MAY AFFECT RESULT Performed at [facility], [address] [place], [place] [address]    No results found.    Assessment/Plan Pneumoperitoneum  - CT with above and ascites in upper abdomen and high density/complex ascites in the pelvis, cannot exclude hemoperitoneum, exact site of perforation not seen but colonic etiology favored given colonic distention - patient in A. Fib RVR, febrile to 103, BP soft  - peritonitis on exam - recommend admission to CCM - recommend proceeding to OR for emergent exploratory laparotomy   The operative and non-operative management of pneumoperitoneum was discussed with the patient. Risks of surgery including bleeding, infection,  drain placement, open abdomen, ostomy creation, need for additional procedures, prolonged hospital stay, as well as the risks of general anesthesia were discussed with the patient and he would like to proceed with surgery. Questions were welcomed and answered.   FEN: NPO, IVF VTE: LD eliquis 72h ago, hold for now ID: [id number] given; Zosyn q 8h >>   -  per admitting -  Chronic diastolic CHF - ECHO 09/19/23 with EF 60-65%, mild LVH Persistent A. 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 Discussed with ED provider  This care required high  level of medical decision making.   Hosie Spangle, [facility]  Surgery 10/24/2023, 2:41 PM Please see Amion for pager number during day hours 7:00am-4:30pm

## 2023-10-24 NOTE — Anesthesia Procedure Notes (Signed)
Central Venous Catheter Insertion Performed by: [name], MD, anesthesiologist Start/End2/14/2025 4:00 PM, 10/24/2023 4:10 PM Patient location: OR. Preanesthetic checklist: patient identified, IV checked, site marked, risks and benefits discussed, surgical consent, monitors and equipment checked, pre-op evaluation, timeout performed and anesthesia consent Position: Trendelenburg Hand hygiene performed , maximum sterile barriers used  and Seldinger technique used Catheter size: 9 Fr Total catheter length 16. Central line was placed.MAC introducer Procedure performed using ultrasound guided technique. Ultrasound Notes:anatomy identified, needle tip was noted to be adjacent to the nerve/plexus identified, no ultrasound evidence of intravascular and/or intraneural injection and image(s) printed for medical record Attempts: 1 Following insertion, line sutured, dressing applied and Biopatch. Post procedure assessment: blood return through all ports, free fluid flow and no air  Patient tolerated the procedure well with no immediate complications.

## 2023-10-24 NOTE — ED Notes (Signed)
Pt de clothed. Placed into gown and on cardiac monitor. Rectal temp obtained and EKG. EKG hand off to MD.

## 2023-10-24 NOTE — Anesthesia Procedure Notes (Signed)
Arterial Line Insertion Start/End2/14/2025 4:00 PM, 10/24/2023 4:08 PM Performed by: [name], MD, Sudie Grumbling, RN, CRNA  Patient location: OR. Preanesthetic checklist: patient identified Emergency situation radial was placed Catheter size: 20 G  Attempts: 1 Procedure performed without using ultrasound guided technique. Following insertion, Biopatch.

## 2023-10-24 NOTE — Progress Notes (Addendum)
eLink Physician-Brief Progress Note Patient Name: Daniel Butler DOB: 1965/11/15 MRN: [id number]   Date of Service  10/24/2023  HPI/Events of Note   K+ = 2.4 @ 1244  iCa 0.87  eICU Interventions  Complete 7 total runs of potassium Add calcium supplementation    2157 -switch i-STAT lactic to serum lactic  2256 - Lactic 8.1.  Stable/downtrending.  2340 - clarifying fluids.  Has orders for LR and LR D5.  Maintain LR at 150 cc  0528 - kcl ordered, magnesium ordered  Intervention Category Minor Interventions: Electrolytes abnormality - evaluation and management  Ashritha Desrosiers 10/24/2023, 7:47 PM

## 2023-10-24 NOTE — Consult Note (Deleted)
NAME:  Daniel Butler, MRN:  [id number], DOB:  1966/01/17, LOS: 0 ADMISSION DATE:  10/24/2023, CONSULTATION DATE:  10/24/2023 REFERRING MD:  Dr. Bedelia Person - CCS, CHIEF COMPLAINT:  Bowel perf   History of Present Illness:  Daniel Butler is a 58 y.o. with a past medical history significant for orthostatic hypotension, chronic diastolic congestive heart failure, persistent atrial fibrillation on Eliquis, CKD stage IIIa, anemia of chronic disease, severe alcohol use disorder, chronic ulcer of the left foot, prior SAH secondary to aneurysm s/p clipping, alcohol use, and depression who presented to the ED 2/14 from [facility] and rehab with complaints of abdominal pain and swelling with associated nausea and small-volume emesis that began day prior to admission.  States he has felt unwell x 3 days.  On ED arrival patient was seen febrile with temperature 103.4, tachypneic, and tachycardic.  Lab work significant for K2.4, glucose 116, creatinine 3.12, anion gap 26, alkaline phosphate 33, albumin 2.5, lactic 10.3, WBC 19.6.  CT abdomen and pelvis obtained which revealed substantial free concerning for acute bowel perforation.  Surgery consulted and patient underwent exploratory laparotomy which revealed diffuse colonic distention with ischemia and gangrenous colonic necrosis with scattered areas of perforation and massive flocculent peritonitis.  Postprocedure patient remained intubated and sedated, PCCM consulted for further management admission.  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    Interim History / Subjective:  As above  Objective   Blood pressure 105/83, pulse 65, temperature (!) 101.5 F (38.6 C), temperature source Rectal, resp. rate (!) 25, height 6\' 1"  (1.854 m), weight 106.6 kg, SpO2 93%.        Intake/Output Summary (Last 24 hours) at 10/24/2023 1752 Last data filed at 10/24/2023 1736 Gross per 24 hour  Intake 4200 ml  Output --  Net 4200 ml   Filed Weights   10/24/23 1226  Weight: 106.6 kg    Examination: General: Acute on chronically ill appearing elderly  male lying in bed on mechanical ventilation, in NAD HEENT: ETT, MM pink/moist, PERRL,  Neuro: Sedated on vent  CV: s1s2 regular rate and rhythm, no murmur, rubs, or gallops,  PULM:  Clear to auscultation, no increased work of breathing, no added breath sounds  GI: soft, bowel sounds active in all 4 quadrants, non-tender, non-distended Extremities: warm/dry, no edema  Skin: no rashes or lesions   Resolved Hospital Problem list     Assessment & Plan:  Septic shock in the setting of bowel perforation  -Ex lap 2/14 revealed diffuse colonic distention with ischemia and gangrenous colonic necrosis with scattered areas of perforation and massive flocculent peritonitis.   P: Primary management per surgery  Start Cefepime, Flagyl, Vancomycin, and Micofungin  Follow Wound van output  Continue pressors for MAP goal > 65  Follow cultures  Trend lactic acid  Strict NPO no meds  Tentative plan to return to OR 2/16, no plans to extubate prior  to return to OR   Postoperative ventilator management   -Left intubated post procedure given worsening shock P: Continue ventilator support with lung  protective strategies  Wean PEEP and FiO2 for sats greater than 90%. Head of bed elevated 30 degrees. Plateau pressures less than 30 cm H20.  Follow intermittent chest x-ray and ABG.   SAT/SBT as tolerated, mentation preclude extubation  Ensure adequate pulmonary hygiene  Follow cultures  VAP bundle in place  PAD protocol  AKI superimposed on CKD stage IIIa -Creatinine on admission 3.12 with GFR 22 P: Follow renal function  Monitor urine output Trend Bmet Avoid nephrotoxins Ensure adequate renal perfusion  IV hydration provided on admission   Chronic diastolic congestive heart failure -Most recent EF January 2025 60-65% improved from 45-50% April 2024 Persistent atrial fibrillation on Eliquis P: Hold home Eliquis  Continuous telemetry  Strict intake and output  Daily weight to assess volume status Daily assessment for need to diurese  Closely monitor renal function and electrolytes   Anemia of chronic disease, P: Trend CBC  Transfuse per protocol  Hgb goal > 7  Hyponatremia  P: Supplement   At risk malnutrition  P:  Monitor for need of TPN   Best Practice (right click and "Reselect all SmartList Selections" daily)   Diet/type: NPO DVT prophylaxis SCD Pressure ulcer(s): N/A GI prophylaxis: N/A Lines: Central line Foley:  Yes, and it is still needed Code Status:  full code Last date of multidisciplinary goals of care discussion: Continue to update family daily   Labs   CBC: Recent Labs  Lab 10/24/23 1235 10/24/23 1244  WBC 19.6*  --   NEUTROABS 17.3*  --   HGB 14.4 16.0  HCT 47.7 47.0  MCV 87.0  --   PLT 724*  --     Basic Metabolic Panel: Recent Labs  Lab 10/24/23 1235 10/24/23 1244  NA 137 136  K 2.4* 2.4*  CL 92* 95*  CO2 19*  --   GLUCOSE 116* 119*  BUN 28* 31*  CREATININE 3.12* 2.80*  CALCIUM 8.4*  --   MG 2.2  --    GFR: Estimated Creatinine Clearance: 36.9 mL/min (A) (by C-G formula based on SCr of 2.8 mg/dL (H)). Recent  Labs  Lab 10/24/23 1235 10/24/23 1244  WBC 19.6*  --   LATICACIDVEN  --  10.3*    Liver Function Tests: Recent Labs  Lab 10/24/23 1235  AST 38  ALT 10  ALKPHOS 33*  BILITOT 1.1  PROT 6.2*  ALBUMIN 2.5*   No results for input(s): "LIPASE", "AMYLASE" in the last 168 hours. Recent Labs  Lab 10/24/23 1249  AMMONIA 43*    ABG    Component Value Date/Time   PHART 7.316 (L) 03/03/2015 0405   PCO2ART 43.6 03/03/2015 0405   PO2ART 94.6 03/03/2015 0405   HCO3 28.7 (H) 12/19/2020 1154   TCO2 23 10/24/2023 1244   ACIDBASEDEF 3.6 (H) 03/03/2015 0405   O2SAT 63.0 12/19/2020 1154     Coagulation Profile: Recent Labs  Lab 10/24/23 1235  INR 4.0*    Cardiac Enzymes: No results for input(s): "CKTOTAL", "CKMB", "CKMBINDEX", "TROPONINI" in the last 168 hours.  HbA1C: Hgb A1c MFr Bld  Date/Time Value Ref Range Status  02/22/2019 02:44 PM 5.6 4.6 - 6.5 % Final    Comment:    Glycemic Control Guidelines for People with Diabetes:Non Diabetic:  <6%Goal of Therapy: <7%Additional Action Suggested:  >8%   07/24/2017 02:14 PM 5.1 4.6 - 6.5 %  Final    Comment:    Glycemic Control Guidelines for People with Diabetes:Non Diabetic:  <6%Goal of Therapy: <7%Additional Action Suggested:  >8%     CBG: No results for input(s): "GLUCAP" in the last 168 hours.  Review of Systems:   Unable to assess   Past Medical History:  He,  has a past medical history of Brain aneurysm, Chicken pox, Depression, Elevated LFTs, Gout, Hernia of abdominal cavity (11/2020), High cholesterol, and Hypertension.   Surgical History:   Past Surgical History:  Procedure Laterality Date   BIOPSY  09/02/2023   Procedure: BIOPSY;  Surgeon: Jenel Lucks, MD;  Location: [facility] ENDOSCOPY;  Service: Gastroenterology;;   CARDIOVERSION N/A 01/22/2021   Procedure: CARDIOVERSION;  Surgeon: Meriam Sprague, MD;  Location: [facility] ENDOSCOPY;  Service: Cardiovascular;  Laterality: N/A;   COLONOSCOPY N/A 09/02/2023    Procedure: COLONOSCOPY;  Surgeon: Jenel Lucks, MD;  Location: [facility] ENDOSCOPY;  Service: Gastroenterology;  Laterality: N/A;   CRANIOTOMY Right 03/02/2015   Procedure: CRANIOTOMY INTRACRANIAL  ANEURYSM FOR CLIPPING;  Surgeon: Lisbeth Renshaw, MD;  Location: [facility] NEURO ORS;  Service: Neurosurgery;  Laterality: Right;   ESOPHAGOGASTRODUODENOSCOPY N/A 09/02/2023   Procedure: ESOPHAGOGASTRODUODENOSCOPY (EGD);  Surgeon: Jenel Lucks, MD;  Location: [facility] ENDOSCOPY;  Service: Gastroenterology;  Laterality: N/A;   HEMOSTASIS CLIP PLACEMENT  09/02/2023   Procedure: HEMOSTASIS CLIP PLACEMENT;  Surgeon: Jenel Lucks, MD;  Location: [facility] ENDOSCOPY;  Service: Gastroenterology;;   IR GENERIC HISTORICAL  02/16/2016   IR ANGIO VERTEBRAL SEL VERTEBRAL BILAT MOD SED 02/16/2016 Lisbeth Renshaw, MD [facility]-INTERV RAD   IR GENERIC HISTORICAL  02/16/2016   IR ANGIO INTRA EXTRACRAN SEL INTERNAL CAROTID BILAT MOD SED 02/16/2016 Lisbeth Renshaw, MD [facility]   POLYPECTOMY  09/02/2023   Procedure: POLYPECTOMY;  Surgeon: Jenel Lucks, MD;  Location: [facility] ENDOSCOPY;  Service: Gastroenterology;;   RADIOLOGY WITH ANESTHESIA N/A 03/02/2015   Procedure: RADIOLOGY WITH ANESTHESIA;  Surgeon: Lisbeth Renshaw, MD;  Location: [facility] OR;  Service: Radiology;  Laterality: N/A;   RIGHT/LEFT HEART CATH AND CORONARY ANGIOGRAPHY N/A 12/19/2020   Procedure: RIGHT/LEFT HEART CATH AND CORONARY ANGIOGRAPHY;  Surgeon: Swazil[place], Peter M, MD;  Location: [facility] INVASIVE CV LAB;  Service: Cardiovascular;  Laterality: N/A;   WISDOM TOOTH EXTRACTION       Social History:   reports that he has quit smoking. His smoking use included cigarettes. He has a 62.5 pack-year smoking history. He has never used smokeless tobacco. He reports that he does not currently use alcohol after a past usage of about 70.0 standard drinks of alcohol per week. He reports that he does not use drugs.   Family History:  His family history is not on file. He was  adopted.   Allergies Allergies  Allergen Reactions   Zetia [Ezetimibe] Nausea And Vomiting     Home Medications  Prior to Admission medications   Medication Sig Start Date End Date Taking? Authorizing Provider  acetaminophen (TYLENOL) 325 MG tablet Take 2 tablets (650 mg total) by mouth every 6 (six) hours as needed for mild pain (pain score 1-3) or moderate pain (pain score 4-6). 09/23/23   Arrien, York Ram, MD  allopurinol (ZYLOPRIM) 100 MG tablet Take 1 tablet (100 mg total) by mouth daily. 06/16/23   Olive Bass, FNP  ALPRAZolam Prudy Feeler) 0.5 MG tablet Take 1 tablet (0.5 mg total) by mouth 2 (two) times daily as needed for anxiety. 09/25/23   Zannie Cove, MD  apixaban (ELIQUIS) 5 MG TABS tablet  Take 1 tablet (5 mg total) by mouth 2 (two) times daily. 01/10/23   Hilty, Lisette Abu, MD  diltiazem (CARDIZEM CD) 240 MG 24 hr capsule Take 1 capsule (240 mg total) by mouth daily. 09/26/23   Zannie Cove, MD  empagliflozin (JARDIANCE) 10 MG TABS tablet Take 1 tablet (10 mg total) by mouth daily. 09/24/23   Arrien, York Ram, MD  fenofibrate [facility]) 145 MG tablet Take 1 tablet by mouth once daily 10/15/23   Olive Bass, FNP  folic acid (FOLVITE) 1 MG tablet Take 1 tablet (1 mg total) by mouth daily. 06/16/23   Olive Bass, FNP  furosemide (LASIX) 40 MG tablet Take 1 tablet by mouth once daily 07/23/23   Hilty, Lisette Abu, MD  icosapent Ethyl (VASCEPA) 1 g capsule Take 2 capsules (2 g total) by mouth 2 (two) times daily. 03/25/23   Hilty, Lisette Abu, MD  metoprolol succinate (TOPROL-XL) 50 MG 24 hr tablet Take 1 tablet (50 mg total) by mouth daily. Take with or immediately following a meal. 07/22/23   Hilty, Lisette Abu, MD  Multiple Vitamin (MULTIVITAMIN WITH MINERALS) TABS tablet Take 1 tablet by mouth daily. 09/24/23   Arrien, York Ram, MD  pantoprazole (PROTONIX) 40 MG tablet Take 1 tablet (40 mg total) by mouth 2 (two) times daily. 09/03/23   Narda Bonds, MD  polyethylene glycol (MIRALAX / GLYCOLAX) 17 g packet Take 17 g by mouth 2 (two) times daily. 09/23/23   Arrien, York Ram, MD  rosuvastatin (CRESTOR) 40 MG tablet Take 1 tablet (40 mg total) by mouth daily. 07/22/23   Chrystie Nose, MD  thiamine (VITAMIN B-1) 100 MG tablet Take 100 mg by mouth daily.    [provider]  traZODone (DESYREL) 50 MG tablet Take 0.5-1 tablets (25-50 mg total) by mouth at bedtime as needed for sleep. Patient taking differently: Take 50 mg by mouth at bedtime as needed for sleep. 09/11/23   Olive Bass, FNP     Critical care time:   CRITICAL CARE Performed by: Vickye Astorino D. Harris   Total critical care time: 45 minutes  Critical care time was exclusive of separately billable procedures and treating other patients.  Critical care was necessary to treat or prevent imminent or life-threatening deterioration.  Critical care was time spent personally by me on the following activities: development of treatment plan with patient and/or surrogate as well as nursing, discussions with consultants, evaluation of patient's response to treatment, examination of patient, obtaining history from patient or surrogate, ordering and performing treatments and interventions, ordering and review of laboratory studies, ordering and review of radiographic studies, pulse oximetry and re-evaluation of patient's condition.  Maliyah Willets D. Harris, NP-C [facility] Personal contact information can be found on Amion  If no contact or response made please call [phone] 10/24/2023, 6:27 PM

## 2023-10-24 NOTE — Anesthesia Procedure Notes (Signed)
Procedure Name: Intubation Date/Time: 10/24/2023 3:59 PM  Performed by: Sudie Grumbling, CRNAPre-anesthesia Checklist: Patient identified, Emergency Drugs available, Suction available and Patient being monitored Patient Re-evaluated:Patient Re-evaluated prior to induction Oxygen Delivery Method: Circle system utilized Preoxygenation: Pre-oxygenation with 100% oxygen Induction Type: IV induction Ventilation: Mask ventilation without difficulty Laryngoscope Size: Glidescope and 3 Grade View: Grade I Tube type: Oral Tube size: 7.5 mm Number of attempts: 1 Airway Equipment and Method: Stylet and Oral airway Placement Confirmation: ETT inserted through vocal cords under direct vision, positive ETCO2 and breath sounds checked- equal and bilateral Secured at: 22 cm Tube secured with: Tape Dental Injury: Teeth and Oropharynx as per pre-operative assessment

## 2023-10-25 ENCOUNTER — Inpatient Hospital Stay (HOSPITAL_COMMUNITY): Payer: Medicaid Other

## 2023-10-25 DIAGNOSIS — K650 Generalized (acute) peritonitis: Secondary | ICD-10-CM | POA: Diagnosis not present

## 2023-10-25 DIAGNOSIS — I729 Aneurysm of unspecified site: Secondary | ICD-10-CM

## 2023-10-25 DIAGNOSIS — I609 Nontraumatic subarachnoid hemorrhage, unspecified: Secondary | ICD-10-CM | POA: Diagnosis not present

## 2023-10-25 DIAGNOSIS — R6521 Severe sepsis with septic shock: Secondary | ICD-10-CM | POA: Diagnosis not present

## 2023-10-25 DIAGNOSIS — B952 Enterococcus as the cause of diseases classified elsewhere: Secondary | ICD-10-CM | POA: Diagnosis not present

## 2023-10-25 DIAGNOSIS — A419 Sepsis, unspecified organism: Secondary | ICD-10-CM | POA: Diagnosis not present

## 2023-10-25 DIAGNOSIS — Z9911 Dependence on respirator [ventilator] status: Secondary | ICD-10-CM

## 2023-10-25 DIAGNOSIS — E876 Hypokalemia: Secondary | ICD-10-CM

## 2023-10-25 DIAGNOSIS — K631 Perforation of intestine (nontraumatic): Secondary | ICD-10-CM

## 2023-10-25 DIAGNOSIS — Z9889 Other specified postprocedural states: Secondary | ICD-10-CM | POA: Diagnosis not present

## 2023-10-25 DIAGNOSIS — I4819 Other persistent atrial fibrillation: Secondary | ICD-10-CM

## 2023-10-25 DIAGNOSIS — K55049 Acute infarction of large intestine, extent unspecified: Secondary | ICD-10-CM

## 2023-10-25 LAB — CBC
HCT: 34.1 % — ABNORMAL LOW (ref 39.0–52.0)
Hemoglobin: 10.5 g/dL — ABNORMAL LOW (ref 13.0–17.0)
MCH: 26.3 pg (ref 26.0–34.0)
MCHC: 30.8 g/dL (ref 30.0–36.0)
MCV: 85.5 fL (ref 80.0–100.0)
Platelets: 620 10*3/uL — ABNORMAL HIGH (ref 150–400)
RBC: 3.99 MIL/uL — ABNORMAL LOW (ref 4.22–5.81)
RDW: 21 % — ABNORMAL HIGH (ref 11.5–15.5)
WBC: 14.2 10*3/uL — ABNORMAL HIGH (ref 4.0–10.5)
nRBC: 0.1 % (ref 0.0–0.2)

## 2023-10-25 LAB — BLOOD CULTURE ID PANEL (REFLEXED) - BCID2

## 2023-10-25 LAB — POCT I-STAT 7, (LYTES, BLD GAS, ICA,H+H)
Acid-base deficit: 8 mmol/L — ABNORMAL HIGH (ref 0.0–2.0)
Bicarbonate: 15.5 mmol/L — ABNORMAL LOW (ref 20.0–28.0)
Calcium, Ion: 1.17 mmol/L (ref 1.15–1.40)
HCT: 34 % — ABNORMAL LOW (ref 39.0–52.0)
Hemoglobin: 11.6 g/dL — ABNORMAL LOW (ref 13.0–17.0)
O2 Saturation: 100 %
Patient temperature: 97.9
Potassium: 2.7 mmol/L — CL (ref 3.5–5.1)
Sodium: 132 mmol/L — ABNORMAL LOW (ref 135–145)
TCO2: 16 mmol/L — ABNORMAL LOW (ref 22–32)
pCO2 arterial: 24.7 mm[Hg] — ABNORMAL LOW (ref 32–48)
pH, Arterial: 7.405 (ref 7.35–7.45)
pO2, Arterial: 228 mm[Hg] — ABNORMAL HIGH (ref 83–108)

## 2023-10-25 LAB — PHOSPHORUS: Phosphorus: 2.8 mg/dL (ref 2.5–4.6)

## 2023-10-25 LAB — BASIC METABOLIC PANEL
Anion gap: 16 — ABNORMAL HIGH (ref 5–15)
Anion gap: 12 (ref 5–15)
BUN: 25 mg/dL — ABNORMAL HIGH (ref 6–20)
BUN: 27 mg/dL — ABNORMAL HIGH (ref 6–20)
CO2: 15 mmol/L — ABNORMAL LOW (ref 22–32)
CO2: 17 mmol/L — ABNORMAL LOW (ref 22–32)
Calcium: 8.1 mg/dL — ABNORMAL LOW (ref 8.9–10.3)
Calcium: 7.7 mg/dL — ABNORMAL LOW (ref 8.9–10.3)
Chloride: 101 mmol/L (ref 98–111)
Chloride: 98 mmol/L (ref 98–111)
Creatinine, Ser: 2.2 mg/dL — ABNORMAL HIGH (ref 0.61–1.24)
Creatinine, Ser: 1.99 mg/dL — ABNORMAL HIGH (ref 0.61–1.24)
GFR, Estimated: 34 mL/min — ABNORMAL LOW (ref >60)
GFR, Estimated: 38 mL/min — ABNORMAL LOW (ref >60)
Glucose, Bld: 212 mg/dL — ABNORMAL HIGH (ref 70–99)
Glucose, Bld: 157 mg/dL — ABNORMAL HIGH (ref 70–99)
Potassium: 2.7 mmol/L — CL (ref 3.5–5.1)
Potassium: 3.5 mmol/L (ref 3.5–5.1)
Sodium: 132 mmol/L — ABNORMAL LOW (ref 135–145)
Sodium: 127 mmol/L — ABNORMAL LOW (ref 135–145)

## 2023-10-25 LAB — MRSA NEXT GEN BY PCR, NASAL: MRSA by PCR Next Gen: DETECTED — AB

## 2023-10-25 LAB — GLUCOSE, CAPILLARY: Glucose-Capillary: 173 mg/dL — ABNORMAL HIGH (ref 70–99)

## 2023-10-25 LAB — TRIGLYCERIDES: Triglycerides: 223 mg/dL — ABNORMAL HIGH (ref <150)

## 2023-10-25 LAB — MAGNESIUM: Magnesium: 1.6 mg/dL — ABNORMAL LOW (ref 1.7–2.4)

## 2023-10-25 LAB — LACTIC ACID, PLASMA
Lactic Acid, Venous: 8 mmol/L (ref 0.5–1.9)
Lactic Acid, Venous: 3 mmol/L (ref 0.5–1.9)

## 2023-10-25 MED ORDER — POTASSIUM CHLORIDE 10 MEQ/100ML IV SOLN
10.0000 meq | INTRAVENOUS | Status: AC
Start: 2023-10-25 — End: 2023-10-25
  Administered 2023-10-25 (×6): 10 meq via INTRAVENOUS
  Filled 2023-10-25 (×3): qty 100

## 2023-10-25 MED ORDER — LACTATED RINGERS IV BOLUS
250.0000 mL | Freq: Once | INTRAVENOUS | Status: AC
  Administered 2023-10-25: 250 mL via INTRAVENOUS

## 2023-10-25 MED ORDER — POTASSIUM CHLORIDE 10 MEQ/100ML IV SOLN
10.0000 meq | INTRAVENOUS | Status: DC
  Filled 2023-10-25 (×3): qty 100

## 2023-10-25 MED ORDER — POTASSIUM CHLORIDE 10 MEQ/100ML IV SOLN
10.0000 meq | INTRAVENOUS | Status: AC
Start: 2023-10-25 — End: 2023-10-25
  Administered 2023-10-25 (×3): 10 meq via INTRAVENOUS
  Filled 2023-10-25 (×3): qty 100

## 2023-10-25 MED ORDER — AMIODARONE LOAD VIA INFUSION
150.0000 mg | Freq: Once | INTRAVENOUS | Status: AC
  Administered 2023-10-25: 150 mg via INTRAVENOUS
  Filled 2023-10-25: qty 83.34

## 2023-10-25 MED ORDER — SODIUM CHLORIDE 0.9% FLUSH
10.0000 mL | Freq: Two times a day (BID) | INTRAVENOUS | Status: DC
  Administered 2023-10-25 – 2023-10-30 (×7): 10 mL
  Administered 2023-10-31 (×2): 20 mL
  Administered 2023-11-01 – 2023-11-04 (×6): 10 mL
  Administered 2023-11-04: 20 mL
  Administered 2023-11-05 – 2023-11-07 (×4): 10 mL
  Administered 2023-11-07: 30 mL
  Administered 2023-11-08 – 2023-11-09 (×4): 10 mL
  Administered 2023-11-10: 30 mL
  Administered 2023-11-10: 40 mL
  Administered 2023-11-11: 20 mL

## 2023-10-25 MED ORDER — MAGNESIUM SULFATE 4 GM/100ML IV SOLN
4.0000 g | Freq: Once | INTRAVENOUS | Status: AC
  Administered 2023-10-25: 4 g via INTRAVENOUS
  Filled 2023-10-25 (×2): qty 100

## 2023-10-25 MED ORDER — PIPERACILLIN-TAZOBACTAM 3.375 G IVPB
3.3750 g | Freq: Three times a day (TID) | INTRAVENOUS | Status: DC
  Administered 2023-10-25 – 2023-11-07 (×39): 3.375 g via INTRAVENOUS
  Filled 2023-10-25 (×39): qty 50

## 2023-10-25 MED ORDER — HYDROCORTISONE SOD SUC (PF) 100 MG IJ SOLR
100.0000 mg | Freq: Two times a day (BID) | INTRAMUSCULAR | Status: DC
  Administered 2023-10-25 – 2023-10-28 (×7): 100 mg via INTRAVENOUS
  Filled 2023-10-25 (×7): qty 2

## 2023-10-25 MED ORDER — SODIUM CHLORIDE 0.9% FLUSH: 10.0000 mL | INTRAVENOUS | Status: DC | PRN

## 2023-10-25 MED ORDER — AMIODARONE HCL IN DEXTROSE 360-4.14 MG/200ML-% IV SOLN
60.0000 mg/h | INTRAVENOUS | Status: DC
  Administered 2023-10-25 (×2): 60 mg/h via INTRAVENOUS
  Filled 2023-10-25 (×2): qty 200

## 2023-10-25 MED ORDER — POTASSIUM CHLORIDE 20 MEQ PO PACK: 40.0000 meq | PACK | ORAL | Status: DC

## 2023-10-25 MED ORDER — AMIODARONE HCL IN DEXTROSE 360-4.14 MG/200ML-% IV SOLN
30.0000 mg/h | INTRAVENOUS | Status: DC
Start: 2023-10-25 — End: 2023-10-31
  Administered 2023-10-26 – 2023-10-30 (×10): 30 mg/h via INTRAVENOUS
  Filled 2023-10-25 (×11): qty 200

## 2023-10-25 NOTE — Progress Notes (Signed)
NAME:  Daniel Butler, MRN:  [id number], DOB:  1966/07/24, LOS: 1 ADMISSION DATE:  10/24/2023, CONSULTATION DATE:  10/24/2023 REFERRING MD:  Dr. Bedelia Person - CCS, CHIEF COMPLAINT:  Bowel perf   History of Present Illness:  Daniel Butler is a 58 y.o. 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  States he has felt unwell x 3 days.  On ED arrival patient was seen febrile with temperature 103.4, tachypneic, and tachycardic.  Lab work significant for K2.4, glucose 116, creatinine 3.12, anion gap 26, alkaline phosphate 33, albumin 2.5, lactic 10.3, WBC 19.6.  CT abdomen and pelvis obtained which revealed substantial free concerning for acute bowel perforation.  Surgery consulted and patient underwent exploratory laparotomy which revealed diffuse colonic distention with ischemia and gangrenous colonic necrosis with scattered areas of perforation and massive flocculent peritonitis.  Postprocedure patient remained intubated and sedated, PCCM consulted for further management admission.  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    Interim History / Subjective:  Overnight continued IVF On levophed 16 and vasopressin Objective   Blood pressure 110/89, pulse 80, temperature (!) 94.6 F (34.8 C), resp. rate 20, height 6\' 1"  (1.854 m), weight 119.3 kg, SpO2 100%.    Vent Mode: PRVC FiO2 (%):  [60 %-100 %] 60 % Set Rate:  [16 bmp] 16 bmp Vt Set:  [500 mL-640 mL] 640 mL PEEP:  [5 cmH20] 5 cmH20 Plateau Pressure:  [16 cmH20-18 cmH20] 18 cmH20   Intake/Output Summary (Last 24 hours) at 10/25/2023 1044 Last data filed at 10/25/2023 0900 Gross per 24 hour  Intake 11239.05 ml  Output 1720 ml  Net 9519.05 ml   Filed Weights   10/24/23 1226 10/25/23 0441  Weight: 106.6 kg 119.3 kg   Physical Exam: General: Critically and chronically ill-appearing, no acute distress HENT: [place], AT, ETT in place Eyes: EOMI, no scleral icterus Respiratory: Clear to auscultation bilaterally.  No crackles, wheezing or rales Cardiovascular: RRR, -M/R/G, no JVD GI: BS+, soft, , wound vac in place Extremities:-Edema,-tenderness Neuro: Sedated, PERRL GU: Foley in place  Imaging, labs and test in EMR in the last 24 hours reviewed independently by me. Pertinent findings below:  K 2.7 LA 8 BUN/Cr improving to 25/2.20 WBC 14  Bcx 10/24/23 GPC with BCID Enteroccus faecalis  Resolved Hospital Problem list     Assessment & Plan:  Septic shock in the setting of bowel perforation  -Ex lap 2/14 revealed diffuse colonic distention with ischemia and gangrenous colonic necrosis with scattered areas of perforation  and massive flocculent peritonitis.   P: Primary management per surgery  S/p Cefepime, Flagyl, Vancomycin, and Micofungin  Zosyn  started 2/15. Dc'd Cefepime/Flagyl/Vanc Remains on Micafungin Follow Wound vac output  Wean levophed and vasopressin for MAP goal > 65  Add stress dose steroids Follow cultures  Trend LA Strict NPO with no meds Tentative plan to return to OR 2/16, no plans to extubate prior to return to OR   Postoperative ventilator management   -Left intubated post procedure given worsening shock P: Full vent support LTVV, 4-8cc/kg IBW with goal Pplat<30 and DP<15 Wean PEEP and FiO2 for sats greater than 90%. Head of bed elevated 30 degrees. Plateau pressures less than 30 cm H20.  Follow intermittent chest x-ray and ABG.   SAT/SBT as tolerated, mentation preclude extubation. Holding on extubation with plan to return to OR Ensure adequate pulmonary hygiene  Follow cultures  VAP bundle in place  PAD protocol  AKI superimposed on CKD stage IIIa - improving Metabolic acidosis -Creatinine on admission 3.12 with GFR 22 P: Follow renal function  Monitor urine output Trend Bmet Avoid nephrotoxins Ensure adequate renal perfusion   Chronic diastolic congestive heart failure -Most recent EF January 2025 60-65% improved from 45-50% April 2024 Persistent atrial fibrillation on Eliquis P: Hold home Eliquis  Continuous telemetry  Strict intake and output  Daily weight to assess volume status Daily assessment for need to diurese  Closely monitor renal function and electrolytes  Check CVP  Anemia of chronic disease, P: Trend CBC  Transfuse per protocol  Hgb goal > 7  Hyponatremia - improving Hypokalemia P: Supplement  Replete and trend  At risk malnutrition  P:  Monitor for need of TPN   Best Practice (right click and "Reselect all SmartList Selections" daily)   Diet/type: NPO DVT prophylaxis SCD Pressure ulcer(s): N/A GI prophylaxis: N/A Lines: Central line Foley:  Yes, and it is still needed Code Status:  full code Last date of multidisciplinary goals of care discussion:  Continue to update family daily   Critical care time:    The patient is critically ill with multiple organ systems failure and requires high complexity decision making for assessment and support, frequent evaluation and titration of therapies, application of advanced monitoring technologies and extensive interpretation of multiple databases.  Independent Critical Care Time: 36 Minutes.   Mechele Collin, M.D. [facility] Pulmonary/Critical Care Medicine 10/25/2023 10:44 AM   Please see Amion for pager number to reach on-call Pulmonary and Critical Care Team.

## 2023-10-25 NOTE — Consult Note (Addendum)
Date of Admission:  10/24/2023          Reason for Consult: Enterococcus faecalis bacteremia due to perforated bowel with necrotic colon    Referring Provider: Candy Sledge auto consult and Mechele Collin, MD   Assessment:  E faecalis bacteremia and  severe sepsis and septic shock with multi-organ failure, critical illness due to Multiple bowel perforations with feculent peritoneum status post exploratory laparotomy and partial colectomy with vacuum dressing in place Left foot ulcer Severe alcohol abuse disorder Chronic orthostatic hypotension Distant atrial fibrillation Acute on chronic kidney disease 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  Plan:  DC vancomycin, exchange cefepime, flagy for zosyn to target E faecalis and still provide broad spectrum antibiotics for his abdomen along with the micafungin for now Repeat blood cultures At some point he will need a central line holiday and then have blood cultures repeated after that 2D echocardiogram When stable would obtain a transesophageal echocardiogram Will reevaluate the foot ulcer and this will likely need some imaging done as well though it is NOT  pressing issue at this moment in time  Principal Problem:   S/P exploratory laparotomy Active Problems:   Septic shock (HCC)   Scheduled Meds:  albuterol  2.5 mg Nebulization Q6H   Chlorhexidine Gluconate Cloth  6 each Topical Q0600   docusate  100 mg Per Tube BID   hydrocortisone sod succinate (SOLU-CORTEF) inj  100 mg Intravenous Q12H    HYDROmorphone (DILAUDID) injection  1 mg Intravenous Once   mouth rinse  15 mL Mouth Rinse Q2H   pantoprazole (PROTONIX) IV  40 mg Intravenous QHS   polyethylene glycol  17 g Per Tube Daily   Continuous Infusions:  sodium chloride Stopped (10/25/23 0801)   fentaNYL infusion INTRAVENOUS 50 mcg/hr (10/25/23 0900)   micafungin (MYCAMINE) 100 mg in sodium chloride 0.9 % 100 mL IVPB Stopped (10/24/23 2200)    norepinephrine (LEVOPHED) Adult infusion 16 mcg/min (10/25/23 0900)   piperacillin-tazobactam (ZOSYN)  IV     potassium chloride 10 mEq (10/25/23 1126)   propofol (DIPRIVAN) infusion 35 mcg/kg/min (10/25/23 1107)   vasopressin 0.03 Units/min (10/25/23 0900)   PRN Meds:.albuterol, docusate, fentaNYL, haloperidol lactate, ondansetron (ZOFRAN) IV, mouth rinse, polyethylene glycol  HPI: Daniel Butler is a 58 y.o. male past medical history significant for chronic orthostatic hypotension diastolic congestive heart failure atrial fibrillation chronic kidney disease severe alcohol use disorder history of chronic ulcer on the left foot prior subarachnoid hemorrhage secondary to aneurysm status post clipping who presented to [facility] with abdominal pain and swelling in his abdomen with nausea and emesis.  He was febrile to 103.4 tachypneic and tachycardic.  He had acute renal injury.  CT abdomen pelvis was performed which  showed air in the abdomen and concern for acute bowel perforation.    Patient was seen by general surgery and took him to the operating room for emergent exploratory laparotomy.  The operating room he was found to have diffuse colonic distention and ischemia with gangrenous colonic necrosis and scattered areas of perforation and massive feculent peritonitis.  He had subtotal abdominal colectomy performed left in intestial discontinuity with wound vacuum placed.  Cultures obtained.  Have been obtained in the operating room.  The patient has been on spectrum antibiotics in the form of vancomycin and cefepime metronidazole and vancomycin.  Blood cultures drawn at admission here at [facility] have now rapidly grown Enterococcus faecalis in 2 out of 2 cultures.  The Enterococcus faecalis is undoubtably due to his bowel perforation and it fulminant infection.  He was febrile to over 103 degrees when he first was admitted but now is hypothermic with a bear hugger in  place.  He remains critically ill on the ventilator and on pressors.    I will narrow his antibiotics a bit today by getting rid of the vancomycin.  I will exchange his cefepime and metronidazole for Zosyn to better target Enterococcus and I will leave the micafungin on board for now.   I have ordered a 2D echocardiogram to assess his heart valves.  He does have a central line that at some point will need to be removed for a line holiday but clearly at this moment it is not appropriate to do so.  Would also obtain a transesophageal echocardiogram during this hospitalization.  Fully he improves and we can narrow his antibiotics further.   I have personally spent 62 minutes involved in critical management of this patient with septic shock, with multiorgan failure with and E faecalis bacteremia in context of afraid of bowel and feculent peritoneum. The patient has high risk of mortality with e faecalis bacteremia alone (hence reaason it is an auto consult per [facility]) and requires complex medical decision making and multidisciplinary care.  Evaluation of the patient requires complex antimicrobial therapy evaluation, counseling , isolation needs to reduce disease transmission and risk assessment and mitigation.      Review of Systems: Review of Systems  Unable to perform ROS: Intubated    Past Medical History:  Diagnosis Date   Brain aneurysm    Chicken pox    Depression    Elevated LFTs    Gout    Hernia of abdominal cavity 11/2020   High cholesterol    Hypertension     Social History   Tobacco Use   Smoking status: Former    Current packs/day: 2.50    Average packs/day: 2.5 packs/day for 25.0 years (62.5 ttl pk-yrs)    Types: Cigarettes   Smokeless tobacco: Never  Vaping Use   Vaping status: Never Used  Substance Use Topics   Alcohol use: Not Currently    Alcohol/week: 70.0 standard drinks of alcohol    Types: 35 Cans of beer, 35 Shots of liquor per week   Drug  use: No    Family History  Adopted: Yes   Allergies  Allergen Reactions   Zetia [Ezetimibe] Nausea And Vomiting    OBJECTIVE: Blood pressure 110/89, pulse 80, temperature (!) 94.6 F (34.8 C), resp. rate 20, height 6\' 1"  (1.854 m), weight 119.3 kg, SpO2 100%.  Physical Exam Constitutional:      Appearance: He is ill-appearing.     Interventions: He is intubated.  Cardiovascular:     Rate and Rhythm: Tachycardia present. Rhythm irregular.     Heart sounds: No murmur heard.    No friction rub. No gallop.  Pulmonary:     Effort: He is intubated.     Breath sounds: Rhonchi present.  Chest:     Chest wall: No tenderness.  Abdominal:     Comments: Wound vacuum in place    No splinter hemorrhages or Janeway lesions  Right IJ central line  Lab Results Lab Results  Component Value Date   WBC 14.2 (H) 10/25/2023   HGB 11.6 (L) 10/25/2023   HCT 34.0 (L) 10/25/2023   MCV 85.5 10/25/2023   PLT 620 (H) 10/25/2023    Lab Results  Component Value Date  CREATININE 2.20 (H) 10/25/2023   BUN 25 (H) 10/25/2023   NA 132 (L) 10/25/2023   K 2.7 (LL) 10/25/2023   CL 101 10/25/2023   CO2 15 (L) 10/25/2023    Lab Results  Component Value Date   ALT 7 10/24/2023   AST 44 (H) 10/24/2023   ALKPHOS 23 (L) 10/24/2023   BILITOT 0.8 10/24/2023     Microbiology: Recent Results (from the past 240 hours)  Blood Culture (routine x 2)     Status: None (Preliminary result)   Collection Time: 10/24/23 12:35 PM   Specimen: BLOOD  Result Value Ref Range Status   Specimen Description BLOOD BLOOD RIGHT ARM  Final   Special Requests   Final    BOTTLES DRAWN AEROBIC AND ANAEROBIC Blood Culture results may not be optimal due to an inadequate volume of blood received in culture bottles   Culture  Setup Time   Final    GRAM POSITIVE COCCI AEROBIC BOTTLE ONLY CRITICAL RESULT CALLED TO, READ BACK BY AND VERIFIED WITH: PHARMD KENDALL H. [phone] FCP Performed at [facility],  [address], [place], [place] [address]    Culture GRAM POSITIVE COCCI  Final   Report Status PENDING  Incomplete  Blood Culture ID Panel (Reflexed)     Status: Abnormal   Collection Time: 10/24/23 12:35 PM  Result Value Ref Range Status   Enterococcus faecalis DETECTED (A) NOT DETECTED Final    Comment: CRITICAL RESULT CALLED TO, READ BACK BY AND VERIFIED WITH: PHARMD KENDALL H. [phone] FCP    Enterococcus Faecium NOT DETECTED NOT DETECTED Final   Listeria monocytogenes NOT DETECTED NOT DETECTED Final   Staphylococcus species NOT DETECTED NOT DETECTED Final   Staphylococcus aureus (BCID) NOT DETECTED NOT DETECTED Final   Staphylococcus epidermidis NOT DETECTED NOT DETECTED Final   Staphylococcus lugdunensis NOT DETECTED NOT DETECTED Final   Streptococcus species NOT DETECTED NOT DETECTED Final   Streptococcus agalactiae NOT DETECTED NOT DETECTED Final   Streptococcus pneumoniae NOT DETECTED NOT DETECTED Final   Streptococcus pyogenes NOT DETECTED NOT DETECTED Final   A.calcoaceticus-baumannii NOT DETECTED NOT DETECTED Final   Bacteroides fragilis NOT DETECTED NOT DETECTED Final   Enterobacterales NOT DETECTED NOT DETECTED Final   Enterobacter cloacae complex NOT DETECTED NOT DETECTED Final   Escherichia coli NOT DETECTED NOT DETECTED Final   Klebsiella aerogenes NOT DETECTED NOT DETECTED Final   Klebsiella oxytoca NOT DETECTED NOT DETECTED Final   Klebsiella pneumoniae NOT DETECTED NOT DETECTED Final   Proteus species NOT DETECTED NOT DETECTED Final   Salmonella species NOT DETECTED NOT DETECTED Final   Serratia marcescens NOT DETECTED NOT DETECTED Final   Haemophilus influenzae NOT DETECTED NOT DETECTED Final   Neisseria meningitidis NOT DETECTED NOT DETECTED Final   Pseudomonas aeruginosa NOT DETECTED NOT DETECTED Final   Stenotrophomonas maltophilia NOT DETECTED NOT DETECTED Final   Candida albicans NOT DETECTED NOT DETECTED Final   Candida auris NOT DETECTED NOT DETECTED  Final   Candida glabrata NOT DETECTED NOT DETECTED Final   Candida krusei NOT DETECTED NOT DETECTED Final   Candida parapsilosis NOT DETECTED NOT DETECTED Final   Candida tropicalis NOT DETECTED NOT DETECTED Final   Cryptococcus neoformans/gattii NOT DETECTED NOT DETECTED Final   Vancomycin resistance NOT DETECTED NOT DETECTED Final    Comment: Performed at [facility], [address] [place], [place] [address]  Blood Culture (routine x 2)     Status: None (Preliminary result)  Collection Time: 10/24/23 12:40 PM   Specimen: BLOOD  Result Value Ref Range Status   Specimen Description BLOOD BLOOD RIGHT HAND  Final   Special Requests   Final    AEROBIC BOTTLE ONLY Blood Culture results may not be optimal due to an inadequate volume of blood received in culture bottles   Culture   Final    NO GROWTH < 24 HOURS Performed at [facility], [address] [place], [place] [address]    Report Status PENDING  Incomplete  Resp panel by RT-PCR (RSV, Flu A&B, Covid) Anterior Nasal Swab     Status: None   Collection Time: 10/24/23 12:46 PM   Specimen: Anterior Nasal Swab  Result Value Ref Range Status   SARS Coronavirus 2 by RT PCR NEGATIVE NEGATIVE Final   Influenza A by PCR NEGATIVE NEGATIVE Final   Influenza B by PCR NEGATIVE NEGATIVE Final    Comment: (NOTE) The Xpert Xpress SARS-CoV-2/FLU/RSV plus assay is intended as an aid in the diagnosis of influenza from Nasopharyngeal swab specimens and should not be used as a sole basis for treatment. Nasal washings and aspirates are unacceptable for Xpert Xpress SARS-CoV-2/FLU/RSV testing.  Fact Sheet for Patients: BloggerCourse.com  Fact Sheet for Healthcare Providers: SeriousBroker.it  This test is not yet approved or cleared by the [place] FDA and has been authorized for detection and/or diagnosis of SARS-CoV-2 by FDA under an Emergency Use Authorization (EUA). This EUA  will remain in effect (meaning this test can be used) for the duration of the COVID-19 declaration under Section 564(b)(1) of the Act, 21 U.S.C. section 360bbb-3(b)(1), unless the authorization is terminated or revoked.     Resp Syncytial Virus by PCR NEGATIVE NEGATIVE Final    Comment: (NOTE) Fact Sheet for Patients: BloggerCourse.com  Fact Sheet for Healthcare Providers: SeriousBroker.it  This test is not yet approved or cleared by the [place] FDA and has been authorized for detection and/or diagnosis of SARS-CoV-2 by FDA under an Emergency Use Authorization (EUA). This EUA will remain in effect (meaning this test can be used) for the duration of the COVID-19 declaration under Section 564(b)(1) of the Act, 21 U.S.C. section 360bbb-3(b)(1), unless the authorization is terminated or revoked.  Performed at [facility], [address] [place], [place] [address]   MRSA Next Gen by PCR, Nasal     Status: Abnormal   Collection Time: 10/24/23  8:21 PM   Specimen: Nasal Mucosa; Nasal Swab  Result Value Ref Range Status   MRSA by PCR Next Gen DETECTED (A) NOT DETECTED Final    Comment: RESULT CALLED TO, READ BACK BY AND VERIFIED WITH: D HAGGERTY,RN@0007  10/25/23 MK (NOTE) The GeneXpert MRSA Assay (FDA approved for NASAL specimens only), is one component of a comprehensive MRSA colonization surveillance program. It is not intended to diagnose MRSA infection nor to guide or monitor treatment for MRSA infections. Test performance is not FDA approved in patients less than 60 years old. Performed at [facility], [address] [place], [place] [address]     Acey Lav, MD [facility] [phone] pager  10/25/2023, 11:55 AM

## 2023-10-25 NOTE — Progress Notes (Addendum)
Pharmacy Antibiotic Note  Daniel Butler is a 58 y.o. male admitted on 10/24/2023 with  intra-abdominal infection, perforated bowel .  Pharmacy has been consulted for zosyn dosing.  Plan: Zosyn 3.375g IV q 8h - extended infusion  F/u LOT, renal function and culture data    Height: 6\' 1"  (185.4 cm) Weight: 119.3 kg (263 lb 0.1 oz) IBW/kg (Calculated) : 79.9  Temp (24hrs), Avg:96 F (35.6 C), Min:92.8 F (33.8 C), Max:103.4 F (39.7 C)  Recent Labs  Lab 10/24/23 1235 10/24/23 1244 10/24/23 1943 10/24/23 2202 10/25/23 0057 10/25/23 0439  WBC 19.6*  --  8.2  --   --  14.2*  CREATININE 3.12* 2.80* 2.53*  --   --  2.20*  LATICACIDVEN  --  10.3*  --  8.1* 8.0*  --     Estimated Creatinine Clearance: 49.5 mL/min (A) (by C-G formula based on SCr of 2.2 mg/dL (H)).    Allergies  Allergen Reactions   Zetia [Ezetimibe] Nausea And Vomiting    Antimicrobials this admission: Zosyn 2/15> Micafungin 2/14> Vanc 2/14 x1 Cefpeime 2/14>2/15 Flagyl 2/14>2/15  Dose adjustments this admission:   Microbiology results: 2/14 BCx: 1/4 E faecalis  2/14 Trach asp: 2/14 MRSA PCR: pos 2/14 RVP neg  Thank you for allowing pharmacy to be a part of this patient's care.  Calton Dach, PharmD, BCCCP Clinical Pharmacist 10/25/2023 10:43 AM

## 2023-10-25 NOTE — Anesthesia Postprocedure Evaluation (Signed)
Anesthesia Post Note  Patient: Daniel Butler  Procedure(s) Performed: EXPLORATORY LAPAROTOMY (Abdomen) COLON RESECTION (Abdomen)     Patient location during evaluation: SICU Anesthesia Type: General Level of consciousness: sedated Pain management: pain level controlled Vital Signs Assessment: post-procedure vital signs reviewed and stable Respiratory status: patient remains intubated per anesthesia plan Cardiovascular status: stable Postop Assessment: no apparent nausea or vomiting Anesthetic complications: no   No notable events documented.  Last Vitals:  Vitals:   10/25/23 0845 10/25/23 0900  BP:  110/89  Pulse: (!) 101 80  Resp: 20 20  Temp: (!) 34.7 C (!) 34.8 C  SpO2: 100% 100%    Last Pain:  Vitals:   10/25/23 0800  TempSrc: Esophageal  PainSc:                  Daniel Butler

## 2023-10-25 NOTE — Progress Notes (Signed)
  Daily Progress Note  Assessment:    FLAVIUS REPSHER is an 58 y.o. male with pneumoperitoneum found to have ischemic and gangrenous colonic necrosis and scattered areas of perforation and massive feculent peritonitis intra-op.  Procedures/Events: [2/14] Admission, exploratory laparotomy, subtotal colectomy  Consults: CCM  Subjective/Interval:    Patient remains critically ill on 10 of levo  Objective:  Vital signs for last 24 hours: Temp:  [93 F (33.9 C)-103.4 F (39.7 C)] 93 F (33.9 C) (02/15 0700) Pulse Rate:  [33-178] 109 (02/15 0700) Resp:  [16-30] 20 (02/15 0700) BP: (48-133)/(30-95) 107/69 (02/15 0411) SpO2:  [85 %-100 %] 100 % (02/15 0700) Arterial Line BP: (88-139)/(58-98) 122/75 (02/15 0700) FiO2 (%):  [60 %-100 %] 60 % (02/15 0411) Weight:  [106.6 kg-119.3 kg] 119.3 kg (02/15 0441)  Labs: Notable for leukocytosis to 14.2, hypokalemia to 2.7, acidotic with bicarb of 15.5 and lactic acid 8.0, hypomagnesemic to 1.6. I have personally reviewed all labs for past 24h.  Imaging: CXR: ETT in good position . I have personally reviewed all new imaging  I/Os (past 24h): UOP: 715cc, Net positive: 8.8L, and Abthera: 755cc  Physical Exam:  Gen: Intubated/sedated HEENT: ETT and NGT in place Resp: On full vent support Cardiovascular: Sinus tachycardia Abdomen: Soft, nondistended, abthera in place holding suction, WV with SS drainage Neuro: GCS 3T   Plan:  58yo M with with ischemic colitis complicated by perforation, feculent peritonitis s/p ex lap 2/14 - Continue NGT to suction - Continue abthera - IVF and electrolyte supplementation per CCM - Strict NPO - Plan for takeback and ileostomy likely Monday.    LOS: 1 day   I reviewed Consultant CCM notes, last 24 h vitals and pain scores, last 48 h intake and output, last 24 h labs and trends, and last 24 h imaging results.   Donata Duff, MD [facility] 10/25/2023

## 2023-10-25 NOTE — Progress Notes (Signed)
PHARMACY - PHYSICIAN COMMUNICATION CRITICAL VALUE ALERT - BLOOD CULTURE IDENTIFICATION (BCID)  Daniel Butler is an 58 y.o. male who presented to [facility] on 10/24/2023 with a chief complaint of bowel perforation  Assessment:  1/4 BCX with E Faecalis   Name of physician (or Provider) Contacted: Everardo All (CCM)  Current antibiotics: zosyn (per ID) and micafungin  Changes to prescribed antibiotics recommended:  N/a   Results for orders placed or performed during the hospital encounter of 10/24/23  Blood Culture ID Panel (Reflexed) (Collected: 10/24/2023 12:35 PM)  Result Value Ref Range   Enterococcus faecalis DETECTED (A) NOT DETECTED   Enterococcus Faecium NOT DETECTED NOT DETECTED   Listeria monocytogenes NOT DETECTED NOT DETECTED   Staphylococcus species NOT DETECTED NOT DETECTED   Staphylococcus aureus (BCID) NOT DETECTED NOT DETECTED   Staphylococcus epidermidis NOT DETECTED NOT DETECTED   Staphylococcus lugdunensis NOT DETECTED NOT DETECTED   Streptococcus species NOT DETECTED NOT DETECTED   Streptococcus agalactiae NOT DETECTED NOT DETECTED   Streptococcus pneumoniae NOT DETECTED NOT DETECTED   Streptococcus pyogenes NOT DETECTED NOT DETECTED   A.calcoaceticus-baumannii NOT DETECTED NOT DETECTED   Bacteroides fragilis NOT DETECTED NOT DETECTED   Enterobacterales NOT DETECTED NOT DETECTED   Enterobacter cloacae complex NOT DETECTED NOT DETECTED   Escherichia coli NOT DETECTED NOT DETECTED   Klebsiella aerogenes NOT DETECTED NOT DETECTED   Klebsiella oxytoca NOT DETECTED NOT DETECTED   Klebsiella pneumoniae NOT DETECTED NOT DETECTED   Proteus species NOT DETECTED NOT DETECTED   Salmonella species NOT DETECTED NOT DETECTED   Serratia marcescens NOT DETECTED NOT DETECTED   Haemophilus influenzae NOT DETECTED NOT DETECTED   Neisseria meningitidis NOT DETECTED NOT DETECTED   Pseudomonas aeruginosa NOT DETECTED NOT DETECTED   Stenotrophomonas maltophilia NOT DETECTED NOT  DETECTED   Candida albicans NOT DETECTED NOT DETECTED   Candida auris NOT DETECTED NOT DETECTED   Candida glabrata NOT DETECTED NOT DETECTED   Candida krusei NOT DETECTED NOT DETECTED   Candida parapsilosis NOT DETECTED NOT DETECTED   Candida tropicalis NOT DETECTED NOT DETECTED   Cryptococcus neoformans/gattii NOT DETECTED NOT DETECTED   Vancomycin resistance NOT DETECTED NOT DETECTED    Calton Dach, PharmD, BCCCP Clinical Pharmacist 10/25/2023 10:40 AM

## 2023-10-25 NOTE — Progress Notes (Signed)
Peripherally Inserted Central Catheter Placement  The IV Nurse has discussed with the patient and/or persons authorized to consent for the patient, the purpose of this procedure and the potential benefits and risks involved with this procedure.  The benefits include less needle sticks, lab draws from the catheter, and the patient may be discharged home with the catheter. Risks include, but not limited to, infection, bleeding, blood clot (thrombus formation), and puncture of an artery; nerve damage and irregular heartbeat and possibility to perform a PICC exchange if needed/ordered by physician.  Alternatives to this procedure were also discussed.  Bard Power PICC patient education guide, fact sheet on infection prevention and patient information card has been provided to patient /or left at bedside.  Telephone consent obtained from wife, [name]. PICC inserted by Chari Manning, RN.  PICC Placement Documentation  PICC Triple Lumen 10/25/23 Left Basilic (Active)  Indication for Insertion or Continuance of Line Vasoactive infusions 10/25/23 1558  Exposed Catheter (cm) 0 cm 10/25/23 1558  Site Assessment Clean, Dry, Intact 10/25/23 1558  Lumen #1 Status Saline locked;Blood return noted 10/25/23 1558  Lumen #2 Status Saline locked;Blood return noted 10/25/23 1558  Lumen #3 Status Saline locked;Blood return noted 10/25/23 1558  Dressing Type Transparent;Securing device 10/25/23 1558  Dressing Status Antimicrobial disc/dressing in place;Clean, Dry, Intact 10/25/23 1558  Line Care Connections checked and tightened 10/25/23 1558  Line Adjustment (NICU/IV Team Only) No 10/25/23 1558  Dressing Intervention New dressing;Adhesive placed at insertion site (IV team only) 10/25/23 1558  Dressing Change Due 11/01/23 10/25/23 1558       Burnard Bunting Chenice 10/25/2023, 3:59 PM

## 2023-10-26 ENCOUNTER — Inpatient Hospital Stay (HOSPITAL_COMMUNITY): Payer: Medicaid Other

## 2023-10-26 ENCOUNTER — Encounter (HOSPITAL_COMMUNITY): Payer: Self-pay | Admitting: Certified Registered Nurse Anesthetist

## 2023-10-26 DIAGNOSIS — R7881 Bacteremia: Secondary | ICD-10-CM | POA: Diagnosis not present

## 2023-10-26 DIAGNOSIS — Z9889 Other specified postprocedural states: Secondary | ICD-10-CM | POA: Diagnosis not present

## 2023-10-26 DIAGNOSIS — K650 Generalized (acute) peritonitis: Secondary | ICD-10-CM | POA: Diagnosis not present

## 2023-10-26 DIAGNOSIS — R6521 Severe sepsis with septic shock: Secondary | ICD-10-CM | POA: Diagnosis not present

## 2023-10-26 DIAGNOSIS — I609 Nontraumatic subarachnoid hemorrhage, unspecified: Secondary | ICD-10-CM | POA: Diagnosis not present

## 2023-10-26 DIAGNOSIS — I729 Aneurysm of unspecified site: Secondary | ICD-10-CM | POA: Diagnosis not present

## 2023-10-26 DIAGNOSIS — B952 Enterococcus as the cause of diseases classified elsewhere: Secondary | ICD-10-CM | POA: Diagnosis not present

## 2023-10-26 DIAGNOSIS — A419 Sepsis, unspecified organism: Secondary | ICD-10-CM | POA: Diagnosis not present

## 2023-10-26 LAB — ECHOCARDIOGRAM COMPLETE
Height: 73 in
S' Lateral: 3.3 cm
Weight: 4169.34 [oz_av]

## 2023-10-26 LAB — POCT I-STAT 7, (LYTES, BLD GAS, ICA,H+H)
Acid-base deficit: 3 mmol/L — ABNORMAL HIGH (ref 0.0–2.0)
Bicarbonate: 18.7 mmol/L — ABNORMAL LOW (ref 20.0–28.0)
Calcium, Ion: 1.12 mmol/L — ABNORMAL LOW (ref 1.15–1.40)
HCT: 30 % — ABNORMAL LOW (ref 39.0–52.0)
Hemoglobin: 10.2 g/dL — ABNORMAL LOW (ref 13.0–17.0)
O2 Saturation: 98 %
Patient temperature: 37.6
Potassium: 3.3 mmol/L — ABNORMAL LOW (ref 3.5–5.1)
Sodium: 124 mmol/L — ABNORMAL LOW (ref 135–145)
TCO2: 19 mmol/L — ABNORMAL LOW (ref 22–32)
pCO2 arterial: 25 mm[Hg] — ABNORMAL LOW (ref 32–48)
pH, Arterial: 7.483 — ABNORMAL HIGH (ref 7.35–7.45)
pO2, Arterial: 99 mm[Hg] (ref 83–108)

## 2023-10-26 LAB — CBC
HCT: 32.3 % — ABNORMAL LOW (ref 39.0–52.0)
Hemoglobin: 10.4 g/dL — ABNORMAL LOW (ref 13.0–17.0)
MCH: 27.3 pg (ref 26.0–34.0)
MCHC: 32.2 g/dL (ref 30.0–36.0)
MCV: 84.8 fL (ref 80.0–100.0)
Platelets: 597 10*3/uL — ABNORMAL HIGH (ref 150–400)
RBC: 3.81 MIL/uL — ABNORMAL LOW (ref 4.22–5.81)
RDW: 20.1 % — ABNORMAL HIGH (ref 11.5–15.5)
WBC: 21.9 10*3/uL — ABNORMAL HIGH (ref 4.0–10.5)
nRBC: 0 % (ref 0.0–0.2)

## 2023-10-26 LAB — BASIC METABOLIC PANEL
Anion gap: 16 — ABNORMAL HIGH (ref 5–15)
BUN: 29 mg/dL — ABNORMAL HIGH (ref 6–20)
CO2: 15 mmol/L — ABNORMAL LOW (ref 22–32)
Calcium: 7.8 mg/dL — ABNORMAL LOW (ref 8.9–10.3)
Chloride: 93 mmol/L — ABNORMAL LOW (ref 98–111)
Creatinine, Ser: 2.05 mg/dL — ABNORMAL HIGH (ref 0.61–1.24)
GFR, Estimated: 37 mL/min — ABNORMAL LOW (ref >60)
Glucose, Bld: 178 mg/dL — ABNORMAL HIGH (ref 70–99)
Potassium: 3.4 mmol/L — ABNORMAL LOW (ref 3.5–5.1)
Sodium: 124 mmol/L — ABNORMAL LOW (ref 135–145)

## 2023-10-26 LAB — MAGNESIUM: Magnesium: 2.7 mg/dL — ABNORMAL HIGH (ref 1.7–2.4)

## 2023-10-26 LAB — LACTIC ACID, PLASMA: Lactic Acid, Venous: 2.7 mmol/L (ref 0.5–1.9)

## 2023-10-26 MED ORDER — LACTATED RINGERS IV BOLUS
1000.0000 mL | Freq: Once | INTRAVENOUS | Status: AC
  Administered 2023-10-26: 1000 mL via INTRAVENOUS

## 2023-10-26 MED ORDER — PERFLUTREN LIPID MICROSPHERE
1.0000 mL | INTRAVENOUS | Status: AC | PRN
  Administered 2023-10-26: 4 mL via INTRAVENOUS

## 2023-10-26 MED ORDER — ALBUTEROL SULFATE (2.5 MG/3ML) 0.083% IN NEBU: 2.5000 mg | INHALATION_SOLUTION | Freq: Four times a day (QID) | RESPIRATORY_TRACT | Status: DC | PRN

## 2023-10-26 MED ORDER — POTASSIUM CHLORIDE 10 MEQ/100ML IV SOLN
10.0000 meq | INTRAVENOUS | Status: AC
  Administered 2023-10-26 (×4): 10 meq via INTRAVENOUS
  Filled 2023-10-26 (×4): qty 100

## 2023-10-26 NOTE — Consult Note (Signed)
WOC not on campus over the weekend, will not be able to mark for re-exploration.  Notified surgeon Will place on our FU list for Monday 2/17 for new ostomy needs post op [facility], CNS, [employer] [phone]

## 2023-10-26 NOTE — Progress Notes (Signed)
Subjective: Intubated   Antibiotics:  Anti-infectives (From admission, onward)    Start     Dose/Rate Route Frequency Ordered Stop   10/25/23 1400  piperacillin-tazobactam (ZOSYN) IVPB 3.375 g        3.375 g 12.5 mL/hr over 240 Minutes Intravenous Every 8 hours 10/25/23 0942     10/25/23 0000  ceFEPIme (MAXIPIME) 2 g in sodium chloride 0.9 % 100 mL IVPB  Status:  Discontinued        2 g 200 mL/hr over 30 Minutes Intravenous Every 12 hours 10/24/23 1853 10/25/23 0942   10/24/23 2300  piperacillin-tazobactam (ZOSYN) IVPB 3.375 g  Status:  Discontinued        3.375 g 12.5 mL/hr over 240 Minutes Intravenous Every 8 hours 10/24/23 1509 10/24/23 1853   10/24/23 2300  metroNIDAZOLE (FLAGYL) IVPB 500 mg  Status:  Discontinued        500 mg 100 mL/hr over 60 Minutes Intravenous Every 12 hours 10/24/23 1838 10/25/23 0942   10/24/23 2000  micafungin (MYCAMINE) 100 mg in sodium chloride 0.9 % 100 mL IVPB        100 mg 105 mL/hr over 1 Hours Intravenous Every 24 hours 10/24/23 1838     10/24/23 1945  vancomycin (VANCOREADY) IVPB 2000 mg/400 mL        2,000 mg 200 mL/hr over 120 Minutes Intravenous NOW 10/24/23 1854 10/24/23 2239   10/24/23 1854  vancomycin variable dose per unstable renal function (pharmacist dosing)  Status:  Discontinued         Does not apply See admin instructions 10/24/23 1854 10/25/23 0942   10/24/23 1245  ceFEPIme (MAXIPIME) 2 g in sodium chloride 0.9 % 100 mL IVPB        2 g 200 mL/hr over 30 Minutes Intravenous  Once 10/24/23 1236 10/24/23 1334   10/24/23 1245  metroNIDAZOLE (FLAGYL) IVPB 500 mg        500 mg 100 mL/hr over 60 Minutes Intravenous  Once 10/24/23 1236 10/24/23 1453       Medications: Scheduled Meds:  Chlorhexidine Gluconate Cloth  6 each Topical Q0600   hydrocortisone sod succinate (SOLU-CORTEF) inj  100 mg Intravenous Q12H    HYDROmorphone (DILAUDID) injection  1 mg Intravenous Once   mouth rinse  15 mL Mouth Rinse Q2H    pantoprazole (PROTONIX) IV  40 mg Intravenous QHS   sodium chloride flush  10-40 mL Intracatheter Q12H   Continuous Infusions:  amiodarone 30 mg/hr (10/26/23 1000)   fentaNYL infusion INTRAVENOUS 50 mcg/hr (10/26/23 1000)   micafungin (MYCAMINE) 100 mg in sodium chloride 0.9 % 100 mL IVPB Stopped (10/25/23 2146)   norepinephrine (LEVOPHED) Adult infusion 17 mcg/min (10/26/23 1000)   piperacillin-tazobactam (ZOSYN)  IV 12.5 mL/hr at 10/26/23 1000   potassium chloride 10 mEq (10/26/23 1153)   propofol (DIPRIVAN) infusion 35 mcg/kg/min (10/26/23 1000)   vasopressin 0.03 Units/min (10/26/23 1048)   PRN Meds:.albuterol, fentaNYL, haloperidol lactate, ondansetron (ZOFRAN) IV, mouth rinse, sodium chloride flush    Objective: Weight change: 11.6 kg  Intake/Output Summary (Last 24 hours) at 10/26/2023 1201 Last data filed at 10/26/2023 1000 Gross per 24 hour  Intake 4081.52 ml  Output 665 ml  Net 3416.52 ml   Blood pressure 112/75, pulse (!) 107, temperature 99.7 F (37.6 C), resp. rate 20, height 6\' 1"  (1.854 m), weight 118.2 kg, SpO2 99%. Temp:  [95.9 F (35.5 C)-99.9 F (37.7 C)] 99.7 F (37.6 C) (02/16 1045) Pulse  Rate:  [80-145] 107 (02/16 1045) Resp:  [0-23] 20 (02/16 1045) BP: (62-121)/(38-97) 112/75 (02/16 1045) SpO2:  [83 %-100 %] 99 % (02/16 1045) Arterial Line BP: (90-139)/(58-81) 123/66 (02/16 1045) FiO2 (%):  [30 %-40 %] 30 % (02/16 0807) Weight:  [118.2 kg] 118.2 kg (02/16 0500)  Physical Exam: Physical Exam Constitutional:      Appearance: He is ill-appearing.     Interventions: He is intubated.  Cardiovascular:     Rate and Rhythm: Tachycardia present.     Heart sounds: No murmur heard.    No friction rub. No gallop.  Pulmonary:     Effort: He is intubated.  Abdominal:     Comments: Vauum dressing in place  Skin:    Coloration: Skin is pale.  Neurological:     General: No focal deficit present.     Left foot where he had treatment does not appear  infected acutely at all     CBC:    BMET Recent Labs    10/25/23 1355 10/26/23 0338 10/26/23 1036  NA 127* 124* 124*  K 3.5 3.4* 3.3*  CL 98 93*  --   CO2 17* 15*  --   GLUCOSE 157* 178*  --   BUN 27* 29*  --   CREATININE 1.99* 2.05*  --   CALCIUM 7.7* 7.8*  --      Liver Panel  Recent Labs    10/24/23 1235 10/24/23 1943  PROT 6.2* 4.0*  ALBUMIN 2.5* 1.6*  AST 38 44*  ALT 10 7  ALKPHOS 33* 23*  BILITOT 1.1 0.8       Sedimentation Rate No results for input(s): "ESRSEDRATE" in the last 72 hours. C-Reactive Protein No results for input(s): "CRP" in the last 72 hours.  Micro Results: Recent Results (from the past 720 hours)  Blood Culture (routine x 2)     Status: Abnormal (Preliminary result)   Collection Time: 10/24/23 12:35 PM   Specimen: BLOOD RIGHT ARM  Result Value Ref Range Status   Specimen Description BLOOD RIGHT ARM  Final   Special Requests   Final    BOTTLES DRAWN AEROBIC AND ANAEROBIC Blood Culture results may not be optimal due to an inadequate volume of blood received in culture bottles   Culture  Setup Time   Final    GRAM POSITIVE COCCI AEROBIC BOTTLE ONLY CRITICAL RESULT CALLED TO, READ BACK BY AND VERIFIED WITH: PHARMD KENDALL H. [id number] FCP    Culture (A)  Final    ENTEROCOCCUS FAECALIS SUSCEPTIBILITIES TO FOLLOW Performed at [facility], [address] [place], [place] [address]    Report Status PENDING  Incomplete  Blood Culture ID Panel (Reflexed)     Status: Abnormal   Collection Time: 10/24/23 12:35 PM  Result Value Ref Range Status   Enterococcus faecalis DETECTED (A) NOT DETECTED Final    Comment: CRITICAL RESULT CALLED TO, READ BACK BY AND VERIFIED WITH: PHARMD KENDALL H. [phone] FCP    Enterococcus Faecium NOT DETECTED NOT DETECTED Final   Listeria monocytogenes NOT DETECTED NOT DETECTED Final   Staphylococcus species NOT DETECTED NOT DETECTED Final   Staphylococcus aureus (BCID) NOT DETECTED NOT  DETECTED Final   Staphylococcus epidermidis NOT DETECTED NOT DETECTED Final   Staphylococcus lugdunensis NOT DETECTED NOT DETECTED Final   Streptococcus species NOT DETECTED NOT DETECTED Final   Streptococcus agalactiae NOT DETECTED NOT DETECTED Final   Streptococcus pneumoniae NOT DETECTED NOT DETECTED Final   Streptococcus pyogenes  NOT DETECTED NOT DETECTED Final   A.calcoaceticus-baumannii NOT DETECTED NOT DETECTED Final   Bacteroides fragilis NOT DETECTED NOT DETECTED Final   Enterobacterales NOT DETECTED NOT DETECTED Final   Enterobacter cloacae complex NOT DETECTED NOT DETECTED Final   Escherichia coli NOT DETECTED NOT DETECTED Final   Klebsiella aerogenes NOT DETECTED NOT DETECTED Final   Klebsiella oxytoca NOT DETECTED NOT DETECTED Final   Klebsiella pneumoniae NOT DETECTED NOT DETECTED Final   Proteus species NOT DETECTED NOT DETECTED Final   Salmonella species NOT DETECTED NOT DETECTED Final   Serratia marcescens NOT DETECTED NOT DETECTED Final   Haemophilus influenzae NOT DETECTED NOT DETECTED Final   Neisseria meningitidis NOT DETECTED NOT DETECTED Final   Pseudomonas aeruginosa NOT DETECTED NOT DETECTED Final   Stenotrophomonas maltophilia NOT DETECTED NOT DETECTED Final   Candida albicans NOT DETECTED NOT DETECTED Final   Candida auris NOT DETECTED NOT DETECTED Final   Candida glabrata NOT DETECTED NOT DETECTED Final   Candida krusei NOT DETECTED NOT DETECTED Final   Candida parapsilosis NOT DETECTED NOT DETECTED Final   Candida tropicalis NOT DETECTED NOT DETECTED Final   Cryptococcus neoformans/gattii NOT DETECTED NOT DETECTED Final   Vancomycin resistance NOT DETECTED NOT DETECTED Final    Comment: Performed at [facility], [address] [place], [place] [address]  Blood Culture (routine x 2)     Status: None (Preliminary result)   Collection Time: 10/24/23 12:40 PM   Specimen: BLOOD RIGHT HAND  Result Value Ref Range Status   Specimen Description BLOOD  RIGHT HAND  Final   Special Requests   Final    AEROBIC BOTTLE ONLY Blood Culture results may not be optimal due to an inadequate volume of blood received in culture bottles   Culture   Final    NO GROWTH 2 DAYS Performed at [facility], [address] [place], [place] [address]    Report Status PENDING  Incomplete  Resp panel by RT-PCR (RSV, Flu A&B, Covid) Anterior Nasal Swab     Status: None   Collection Time: 10/24/23 12:46 PM   Specimen: Anterior Nasal Swab  Result Value Ref Range Status   SARS Coronavirus 2 by RT PCR NEGATIVE NEGATIVE Final   Influenza A by PCR NEGATIVE NEGATIVE Final   Influenza B by PCR NEGATIVE NEGATIVE Final    Comment: (NOTE) The Xpert Xpress SARS-CoV-2/FLU/RSV plus assay is intended as an aid in the diagnosis of influenza from Nasopharyngeal swab specimens and should not be used as a sole basis for treatment. Nasal washings and aspirates are unacceptable for Xpert Xpress SARS-CoV-2/FLU/RSV testing.  Fact Sheet for Patients: BloggerCourse.com  Fact Sheet for Healthcare Providers: SeriousBroker.it  This test is not yet approved or cleared by the [place] FDA and has been authorized for detection and/or diagnosis of SARS-CoV-2 by FDA under an Emergency Use Authorization (EUA). This EUA will remain in effect (meaning this test can be used) for the duration of the COVID-19 declaration under Section 564(b)(1) of the Act, 21 U.S.C. section 360bbb-3(b)(1), unless the authorization is terminated or revoked.     Resp Syncytial Virus by PCR NEGATIVE NEGATIVE Final    Comment: (NOTE) Fact Sheet for Patients: BloggerCourse.com  Fact Sheet for Healthcare Providers: SeriousBroker.it  This test is not yet approved or cleared by the [place] FDA and has been authorized for detection and/or diagnosis of SARS-CoV-2 by FDA under an Emergency Use  Authorization (EUA). This EUA will remain in effect (meaning this  test can be used) for the duration of the COVID-19 declaration under Section 564(b)(1) of the Act, 21 U.S.C. section 360bbb-3(b)(1), unless the authorization is terminated or revoked.  Performed at [facility], [address], [place], [place] [address]   MRSA Next Gen by PCR, Nasal     Status: Abnormal   Collection Time: 10/24/23  8:21 PM   Specimen: Nasal Mucosa; Nasal Swab  Result Value Ref Range Status   MRSA by PCR Next Gen DETECTED (A) NOT DETECTED Final    Comment: RESULT CALLED TO, READ BACK BY AND VERIFIED WITH: D HAGGERTY,RN@0007  10/25/23 MK (NOTE) The GeneXpert MRSA Assay (FDA approved for NASAL specimens only), is one component of a comprehensive MRSA colonization surveillance program. It is not intended to diagnose MRSA infection nor to guide or monitor treatment for MRSA infections. Test performance is not FDA approved in patients less than 20 years old. Performed at [facility], [address] [place], [place] [address]   Culture, blood (Routine X 2) w Reflex to ID Panel     Status: None (Preliminary result)   Collection Time: 10/25/23 10:57 AM   Specimen: BLOOD RIGHT ARM  Result Value Ref Range Status   Specimen Description BLOOD RIGHT ARM  Final   Special Requests   Final    BOTTLES DRAWN AEROBIC AND ANAEROBIC Blood Culture results may not be optimal due to an inadequate volume of blood received in culture bottles   Culture   Final    NO GROWTH < 24 HOURS Performed at [facility], [address] [place], [place] [address]    Report Status PENDING  Incomplete  Culture, blood (Routine X 2) w Reflex to ID Panel     Status: None (Preliminary result)   Collection Time: 10/25/23 10:57 AM   Specimen: BLOOD RIGHT HAND  Result Value Ref Range Status   Specimen Description BLOOD RIGHT HAND  Final   Special Requests   Final    BOTTLES DRAWN AEROBIC AND ANAEROBIC Blood Culture  results may not be optimal due to an inadequate volume of blood received in culture bottles   Culture   Final    NO GROWTH < 24 HOURS Performed at [facility], [address], [place], [place] [address]    Report Status PENDING  Incomplete    Studies/Results: DG CHEST PORT 1 VIEW Result Date: 10/25/2023 CLINICAL DATA:  Status post central venous catheter placement EXAM: PORTABLE CHEST 1 VIEW COMPARISON:  10/24/2023 FINDINGS: There is a right IJ Cordis with tip in the projection of the SVC. ETT tip is stable above the carina. New left arm PICC line with tip at the superior cavoatrial junction. Stable cardiomediastinal contours. No pleural fluid, interstitial edema, or airspace disease. IMPRESSION: 1. New left arm PICC line with tip at the superior cavoatrial junction. 2. No acute cardiopulmonary abnormalities. Electronically Signed   By: Signa Kell M.D.   On: 10/25/2023 16:39   DG Chest Port 1 View Result Date: 10/24/2023 CLINICAL DATA:  Check endotracheal tube placement EXAM: PORTABLE CHEST 1 VIEW COMPARISON:  Film from earlier in the same day. FINDINGS: Cardiac shadow is stable. Endotracheal tube, gastric catheter and right jugular sheath are noted in satisfactory position. Lungs are well aerated without focal infiltrate or effusion. No pneumothorax is seen. IMPRESSION: No acute abnormality noted.  Tubes and lines as described. Electronically Signed   By: Alcide Clever M.D.   On: 10/24/2023 20:07   [place] EKG  SITE RITE Result Date: 10/24/2023 If [facility] image not attached, placement could not be confirmed due to current cardiac rhythm.  DG Chest Port 1 View Result Date: 10/24/2023 CLINICAL DATA:  Questionable sepsis - evaluate for abnormality. EXAM: PORTABLE CHEST 1 VIEW COMPARISON:  09/18/2023. FINDINGS: Low lung volume. Bilateral lung fields are clear. Bilateral costophrenic angles are clear. Stable cardio-mediastinal silhouette. No acute osseous abnormalities. The soft tissues are  within normal limits. IMPRESSION: No active disease. Electronically Signed   By: Jules Schick M.D.   On: 10/24/2023 15:35   CT ABDOMEN PELVIS WO CONTRAST Result Date: 10/24/2023 CLINICAL DATA:  Right lower quadrant abdominal pain and sepsis EXAM: CT ABDOMEN AND PELVIS WITHOUT CONTRAST TECHNIQUE: Multidetector CT imaging of the abdomen and pelvis was performed following the standard protocol without IV contrast. RADIATION DOSE REDUCTION: This exam was performed according to the departmental dose-optimization program which includes automated exposure control, adjustment of the mA and/or kV according to patient size and/or use of iterative reconstruction technique. COMPARISON:  CT scan 08/21/2023 FINDINGS: Lower chest: Trace bilateral pleural effusions. Mild cardiomegaly. Prominent epicardial adipose tissue. Hepatobiliary: Unremarkable Pancreas: Unremarkable Spleen: Unremarkable Adrenals/Urinary Tract: Unremarkable Stomach/Bowel: Diffuse dilated colon. Substantial free intraperitoneal gas with ascites in the upper abdomen and high density/complex ascites in the pelvis with air-fluid level, cannot exclude hemoperitoneum in addition to the pneumoperitoneum. Exact site of perforation not well seen although colonic etiology favored. No dilated small bowel. No obvious perforation along the stomach or duodenum. There is some clustered free intraperitoneal gas adjacent to the cecum. Frothy luminal contents and gas in the colon. Vascular/Lymphatic: Mild atheromatous vascular disease. Reproductive: No supplemental non-categorized findings. Other: Presacral edema. Musculoskeletal: Umbilical hernia containing pneumoperitoneum. IMPRESSION: 1. Substantial free intraperitoneal gas with ascites in the upper abdomen and high density/complex ascites in the pelvis with air-fluid level, cannot exclude hemoperitoneum in addition to the pneumoperitoneum. Exact site of perforation not well seen although colonic etiology favored given  the diffuse colonic distension. 2. Diffuse dilated colon with frothy luminal contents and gas. 3. Umbilical hernia containing pneumoperitoneum. 4. Trace bilateral pleural effusions. 5. Mild cardiomegaly. 6. Mild atheromatous vascular disease. 7. Presacral edema. Critical Value/emergent results were called by telephone at the time of interpretation on 10/24/2023 at 2:45 pm to provider [facility] , who verbally acknowledged these results. Electronically Signed   By: Gaylyn Rong M.D.   On: 10/24/2023 14:45      Assessment/Plan:  INTERVAL HISTORY: pt to go back to OR tomorrow   Principal Problem:   S/P exploratory laparotomy Active Problems:   Septic shock (HCC)    JOHNTE PORTNOY is a 58 y.o. male with past medical history significant for atrial fibrillation diastolic heart failure severe alcohol use disorder, subarachnoid hemorrhage, who presented originally to [facility] with abdominal pain and swelling with nausea and emesis with sepsis and septic shock with multiorgan failure.  CT abdomen pelvis showed evidence of bowel perforation he was taken emergently to the operating room at [facility] once transferred here.  In the operating room is found to have diffuse colonic distention with ischemia and gangrenous colonic necrosis and scattered areas of perforation and massive feculent peritonitis.  Note the patient had removal of 9 polyps performed earlier this year during a hospitalization in December according to his wife who is at the bedside today.  Blood cultures on admission have yielded Enterococcus faecalis.  I have changed his antibiotics from vancomycin cefepime Flagyl and micafungin to Zosyn and micafungin.  #1 E  faecalis bacteremia due to colonic perforation, necrosis with severe sepsis and septic shock  Critically ill on the ventilator with Levophed.  His renal function has been deteriorating.  We will continue with Zosyn and micafungin for now.  He is  going back to the operating room tomorrow.  Potentially might be new cultures obtained at that time.  TTE done but not read  Repeat blood cultures taken  He will need line holiday later in his hospital stay if he survives his critical illness   I spent 35 minutes of critical care management of this patient with urosepsis with septic shock and multiorgan failure E faecalis bacteremia requiring pressors mechanical ventilation, adjusting antibiotics, reviewing multiple data bases.  Evaluation of the patient requires complex antimicrobial therapy evaluation, counseling , isolation needs to reduce disease transmission and risk assessment and mitigation.     LOS: 2 days   Acey Lav 10/26/2023, 12:01 PM

## 2023-10-26 NOTE — Progress Notes (Signed)
NAME:  Daniel Butler, MRN:  [id number], DOB:  11/13/1965, LOS: 2 ADMISSION DATE:  10/24/2023, CONSULTATION DATE:  10/24/2023 REFERRING MD:  Dr. Bedelia Person - CCS, CHIEF COMPLAINT:  Bowel perf   History of Present Illness:  Daniel Butler is a 58 y.o. with a past medical history significant for orthostatic hypotension, chronic diastolic congestive heart failure, persistent atrial fibrillation on Eliquis, CKD stage IIIa, anemia of chronic disease, severe alcohol use disorder, chronic ulcer of the left foot, prior SAH secondary to aneurysm s/p clipping, alcohol use, and depression who presented to the ED 2/14 from [facility] and rehab with complaints of abdominal pain and swelling with associated nausea and small-volume emesis that began day prior to admission.  States he has felt unwell x 3 days.  On ED arrival patient was seen febrile with temperature 103.4, tachypneic, and tachycardic.  Lab work significant for K2.4, glucose 116, creatinine 3.12, anion gap 26, alkaline phosphate 33, albumin 2.5, lactic 10.3, WBC 19.6.  CT abdomen and pelvis obtained which revealed substantial free concerning for acute bowel perforation.  Surgery consulted and patient underwent exploratory laparotomy which revealed diffuse colonic distention with ischemia and gangrenous colonic necrosis with scattered areas of perforation and massive flocculent peritonitis.  Postprocedure patient remained intubated and sedated, PCCM consulted for further management admission.  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    Interim History / Subjective:  Unchanged pressor requirement Oliguric Remains intubated with plan for OR tomorrow Objective   Blood pressure 92/72, pulse (!) 110, temperature 99.7 F (37.6 C), temperature source Rectal, resp. rate 20, height 6\' 1"  (1.854 m), weight 118.2 kg, SpO2 99%.    Vent Mode: PRVC FiO2 (%):  [40 %] 40 % Set Rate:  [20 bmp] 20 bmp Vt Set:  [640 mL] 640 mL PEEP:  [5 cmH20] 5 cmH20 Plateau Pressure:  [17 cmH20-18 cmH20] 18 cmH20   Intake/Output Summary (Last 24 hours) at 10/26/2023 1002 Last data filed at 10/26/2023 0800 Gross per 24 hour  Intake 3823.48 ml  Output 665 ml  Net 3158.48 ml   Filed Weights   10/24/23 1226 10/25/23 0441 10/26/23 0500  Weight: 106.6 kg 119.3 kg 118.2 kg    Physical Exam: General: Critically and chronically ill-appearing, sedated HENT: [place], AT, ETT in place Eyes: EOMI, no scleral icterus Respiratory: Clear to auscultation bilaterally.  No crackles, wheezing or rales Cardiovascular: RRR, -M/R/G, no JVD GI: BS+, soft, nontender, wound vac in place Extremities:-Edema,-tenderness Neuro: Sedated, pupils pinpoint and equal, minimally reactive GU: Foley in place  Imaging, labs and test in EMR in the last 24 hours reviewed independently by me. Pertinent findings below: K 3.4 LA 8>2.7 BUN/Cr stable 29/2.05 Worsening serum bicarb 15 WBC worsened 14-21  Bcx 10/24/23 GPC with BCID Enteroccus faecalis  Resolved Hospital Problem list     Assessment & Plan:  Septic shock in the setting of bowel perforation c/b enteroccus faecalis -Ex  lap 2/14 revealed diffuse colonic distention with ischemia and gangrenous colonic necrosis with  scattered areas of perforation and massive flocculent peritonitis.   P: Primary management per surgery. Takeback to OR with ileostomy tomorrow S/p Cefepime, Flagyl, Vancomycin, and Micofungin  Zosyn started 2/15. Dc'd Cefepime/Flagyl/Vanc Remains on Micafungin Follow Wound vac output ID following. Recommends central line holiday when clinically stable. Also will need foot ulcer evaluation when more stable Wean levophed and vasopressin for MAP goal > 65  Continue stress dose steroids Follow cultures  F/u TTE. Will need TEE when stable Strict NPO with no meds  Postoperative ventilator management   -Left intubated post procedure given worsening shock P: Full vent support LTVV, 4-8cc/kg IBW with goal Pplat<30 and DP<15 Wean PEEP and FiO2 for sats greater than 90%. Head of bed elevated 30 degrees. Plateau pressures less than 30 cm H20.  Follow intermittent chest x-ray and ABG.   SAT/SBT when eligible, mentation preclude extubation. Holding on extubation with plan to return to OR Ensure adequate pulmonary hygiene  VAP bundle in place  PAD protocol  AKI superimposed on CKD stage IIIa - stable Metabolic acidosis - worsening -Creatinine on admission 3.12 with GFR 22 P: Obtain ABG Follow renal function  Monitor urine output Trend Bmet Avoid nephrotoxins Ensure adequate renal perfusion   Chronic diastolic congestive heart failure -Most recent EF January 2025 60-65% improved from 45-50% April 2024 Persistent atrial fibrillation on Eliquis P: Hold home Eliquis  Continuous telemetry  Strict intake and output  Daily weight to assess volume status Daily assessment for need to diurese  Closely monitor renal function and electrolytes  Trend CVP  Anemia of chronic disease, P: Trend CBC  Transfuse per protocol  Hgb goal > 7  Hyponatremia - worsening Hypokalemia P: Supplement  Replete  At risk malnutrition  P:  Monitor for need of TPN   Best Practice (right click and  "Reselect all SmartList Selections" daily)   Diet/type: NPO DVT prophylaxis SCD Pressure ulcer(s): N/A GI prophylaxis: PPI Lines: Central line Foley:  Yes, and it is still needed Code Status:  full code Last date of multidisciplinary goals of care discussion:   Critical care time:    The patient is critically ill with multiple organ systems failure and requires high complexity decision making for assessment and support, frequent evaluation and titration of therapies, application of advanced monitoring technologies and extensive interpretation of multiple databases.  Independent Critical Care Time: 37 Minutes.   Mechele Collin, M.D. [facility] Pulmonary/Critical Care Medicine 10/26/2023 10:02 AM   Please see Amion for pager number to reach on-call Pulmonary and Critical Care Team.

## 2023-10-26 NOTE — Progress Notes (Signed)
eLink Physician-Brief Progress Note Patient Name: Daniel Butler DOB: July 26, 1966 MRN: [id number]   Date of Service  10/26/2023  HPI/Events of Note  Am BMP with multiple abnormalities Sodium 124, K 3.4, bicarb 15, creat 2.05.  UOP poor  eICU Interventions  No changes  Will need nephrology consult     Intervention Category Intermediate Interventions: Electrolyte abnormality - evaluation and management  Henry Russel, P 10/26/2023, 6:34 AM

## 2023-10-26 NOTE — Anesthesia Preprocedure Evaluation (Signed)
Anesthesia Evaluation  Patient identified by MRN, date of birth, ID band Patient unresponsive    Reviewed: Allergy & Precautions, NPO status , Patient's Chart, lab work & pertinent test results, Unable to perform ROS - Chart review only  Airway Mallampati: Intubated       Dental   Pt intubated:   Pulmonary Patient abstained from smoking., former smoker      + intubated    Cardiovascular hypertension, +CHF  + dysrhythmias (on amio gtt) Atrial Fibrillation  Rhythm:Irregular Rate:Tachycardia  TTE 2025 1. Left ventricular ejection fraction, by estimation, is 50 to 55% with  beat to beat variability in atrial fibrillation. The left ventricle has  low normal function. Left ventricular endocardial border not optimally  defined to evaluate regional wall  motion. There is mild concentric left ventricular hypertrophy. Left  ventricular diastolic parameters are indeterminate. There is  pseudodyskinesis of the inferolateral wall suggesting intraadominal  pathology.   2. Right ventricular systolic function is mildly reduced. The right  ventricular size is normal. Tricuspid regurgitation signal is inadequate  for assessing PA pressure.   3. The mitral valve is grossly normal. Mild and eccentric mitral valve  regurgitation, not optimally visualized due to image quality. No evidence  of mitral stenosis.   4. The aortic valve is tricuspid. Aortic valve regurgitation is not  visualized. No aortic stenosis is present.   5. Aortic dilatation noted. There is mild dilatation of the aortic root,  measuring 43 mm.   6. The inferior vena cava is normal in size with <50% respiratory  variability, suggesting right atrial pressure of 8 mmHg.   Cath 2022 1. Normal coronary anatomy 2. Moderate LV dysfunction. EF estimated at 40% 3. Normal LV filling pressures 4. Mild pulmonary HTN. Mean PAP 24 mm Hg 5. Reduced cardiac output. Co ox 63%. Cardiac index  1.89     Neuro/Psych  Headaches PSYCHIATRIC DISORDERS Anxiety Depression       GI/Hepatic ,,,(+)     substance abuse  alcohol use  Endo/Other    Renal/GU Renal InsufficiencyRenal disease     Musculoskeletal   Abdominal   Peds  Hematology   Anesthesia Other Findings 58 y.o. with a past medical history significant for orthostatic hypotension, chronic diastolic congestive heart failure, persistent atrial fibrillation on Eliquis, CKD stage IIIa, anemia of chronic disease, severe alcohol use disorder, chronic ulcer of the left foot, prior SAH secondary to aneurysm s/p clipping, alcohol use, and depression who presented to the ED found to have ischemic colitis complicated by perforation, feculent peritonitis s/p ex lap 2/14 on vasopressin and NE gtt  Reproductive/Obstetrics                             Anesthesia Physical Anesthesia Plan  ASA: 4  Anesthesia Plan: General   Post-op Pain Management:    Induction: Intravenous  PONV Risk Score and Plan: 2 and Treatment may vary due to age or medical condition  Airway Management Planned: Oral ETT  Additional Equipment: Arterial line  Intra-op Plan:   Post-operative Plan: Post-operative intubation/ventilation  Informed Consent: I have reviewed the patients History and Physical, chart, labs and discussed the procedure including the risks, benefits and alternatives for the proposed anesthesia with the patient or authorized representative who has indicated his/her understanding and acceptance.     Consent reviewed with POA  Plan Discussed with: CRNA  Anesthesia Plan Comments:        Anesthesia Quick Evaluation

## 2023-10-26 NOTE — Progress Notes (Signed)
Echocardiogram 2D Echocardiogram has been performed.  Warren Lacy Magenta Schmiesing RDCS 10/26/2023, 9:28 AM

## 2023-10-26 NOTE — Progress Notes (Signed)
  Daily Progress Note  Assessment:    Daniel Butler is an 58 y.o. male with pneumoperitoneum found to have ischemic and gangrenous colonic necrosis and scattered areas of perforation and massive feculent peritonitis intra-op.  Procedures/Events: [2/14] Admission, exploratory laparotomy, subtotal colectomy  Consults: CCM  Subjective/Interval:    Patient remains critically ill on 17 of levo  Objective:  Vital signs for last 24 hours: Temp:  [93.7 F (34.3 C)-99.1 F (37.3 C)] 99.1 F (37.3 C) (02/16 0645) Pulse Rate:  [80-145] 105 (02/16 0645) Resp:  [0-23] 20 (02/16 0645) BP: (81-127)/(38-110) 109/89 (02/16 0645) SpO2:  [83 %-100 %] 100 % (02/16 0645) Arterial Line BP: (90-139)/(58-81) 108/63 (02/16 0645) FiO2 (%):  [40 %] 40 % (02/16 0408) Weight:  [118.2 kg] 118.2 kg (02/16 0500)  Labs: Notable for leukocytosis to 21.9, hypokalemia to 3.4, hyponatremic to 124 lactic acid 2.7. I have personally reviewed all labs for past 24h.  Imaging: CXR: ETT in good position . I have personally reviewed all new imaging  I/Os (past 24h): UOP: 440cc, Net positive: 3.5L, and Abthera: 500cc  Physical Exam:  Gen: Intubated/sedated HEENT: ETT and NGT in place Resp: On full vent support Cardiovascular: Sinus tachycardia Abdomen: Soft, nondistended, abthera in place holding suction, WV with SS drainage Neuro: GCS 3T   Plan:  58yo M with with ischemic colitis complicated by perforation, feculent peritonitis s/p ex lap 2/14 - Continue NGT to suction - Continue abthera - IVF and electrolyte supplementation per CCM - Strict NPO - Plan for takeback and ileostomy tomorrow    LOS: 2 days   I reviewed Consultant CCM notes, last 24 h vitals and pain scores, last 48 h intake and output, last 24 h labs and trends, and last 24 h imaging results.  This required moderate level of medical decision making.   Donata Duff, MD [facility] Surgery 10/26/2023

## 2023-10-27 ENCOUNTER — Inpatient Hospital Stay (HOSPITAL_COMMUNITY): Payer: Self-pay | Admitting: Certified Registered"

## 2023-10-27 ENCOUNTER — Encounter (HOSPITAL_COMMUNITY): Payer: Self-pay | Admitting: Surgery

## 2023-10-27 ENCOUNTER — Encounter (HOSPITAL_COMMUNITY)
Admission: EM | Disposition: A | Discharge status: Hospice | Payer: Self-pay | Source: Home / Self Care | Attending: Pulmonary Disease

## 2023-10-27 ENCOUNTER — Encounter (HOSPITAL_COMMUNITY): Payer: Self-pay | Admitting: Certified Registered Nurse Anesthetist

## 2023-10-27 DIAGNOSIS — B952 Enterococcus as the cause of diseases classified elsewhere: Secondary | ICD-10-CM | POA: Diagnosis not present

## 2023-10-27 DIAGNOSIS — I110 Hypertensive heart disease with heart failure: Secondary | ICD-10-CM

## 2023-10-27 DIAGNOSIS — A419 Sepsis, unspecified organism: Secondary | ICD-10-CM | POA: Diagnosis not present

## 2023-10-27 DIAGNOSIS — K668 Other specified disorders of peritoneum: Secondary | ICD-10-CM

## 2023-10-27 DIAGNOSIS — Z9889 Other specified postprocedural states: Secondary | ICD-10-CM | POA: Diagnosis not present

## 2023-10-27 DIAGNOSIS — R6521 Severe sepsis with septic shock: Secondary | ICD-10-CM | POA: Diagnosis not present

## 2023-10-27 DIAGNOSIS — K650 Generalized (acute) peritonitis: Secondary | ICD-10-CM | POA: Diagnosis not present

## 2023-10-27 DIAGNOSIS — K55039 Acute (reversible) ischemia of large intestine, extent unspecified: Secondary | ICD-10-CM

## 2023-10-27 DIAGNOSIS — R7881 Bacteremia: Secondary | ICD-10-CM | POA: Diagnosis not present

## 2023-10-27 DIAGNOSIS — I509 Heart failure, unspecified: Secondary | ICD-10-CM

## 2023-10-27 HISTORY — PX: ILEOSTOMY: SHX1783

## 2023-10-27 HISTORY — PX: LAPAROTOMY: SHX154

## 2023-10-27 LAB — POCT I-STAT 7, (LYTES, BLD GAS, ICA,H+H)
Acid-base deficit: 3 mmol/L — ABNORMAL HIGH (ref 0.0–2.0)
Bicarbonate: 19 mmol/L — ABNORMAL LOW (ref 20.0–28.0)
Calcium, Ion: 1.11 mmol/L — ABNORMAL LOW (ref 1.15–1.40)
HCT: 28 % — ABNORMAL LOW (ref 39.0–52.0)
Hemoglobin: 9.5 g/dL — ABNORMAL LOW (ref 13.0–17.0)
O2 Saturation: 99 %
Patient temperature: 36.6
Potassium: 2.9 mmol/L — ABNORMAL LOW (ref 3.5–5.1)
Sodium: 124 mmol/L — ABNORMAL LOW (ref 135–145)
TCO2: 20 mmol/L — ABNORMAL LOW (ref 22–32)
pCO2 arterial: 24.3 mm[Hg] — ABNORMAL LOW (ref 32–48)
pH, Arterial: 7.499 — ABNORMAL HIGH (ref 7.35–7.45)
pO2, Arterial: 109 mm[Hg] — ABNORMAL HIGH (ref 83–108)

## 2023-10-27 LAB — CBC
HCT: 27.2 % — ABNORMAL LOW (ref 39.0–52.0)
Hemoglobin: 9.3 g/dL — ABNORMAL LOW (ref 13.0–17.0)
MCH: 28.2 pg (ref 26.0–34.0)
MCHC: 34.2 g/dL (ref 30.0–36.0)
MCV: 82.4 fL (ref 80.0–100.0)
Platelets: 448 10*3/uL — ABNORMAL HIGH (ref 150–400)
RBC: 3.3 MIL/uL — ABNORMAL LOW (ref 4.22–5.81)
RDW: 19.7 % — ABNORMAL HIGH (ref 11.5–15.5)
WBC: 20.9 10*3/uL — ABNORMAL HIGH (ref 4.0–10.5)
nRBC: 0.1 % (ref 0.0–0.2)

## 2023-10-27 LAB — BASIC METABOLIC PANEL
Anion gap: 12 (ref 5–15)
BUN: 29 mg/dL — ABNORMAL HIGH (ref 6–20)
CO2: 17 mmol/L — ABNORMAL LOW (ref 22–32)
Calcium: 7.3 mg/dL — ABNORMAL LOW (ref 8.9–10.3)
Chloride: 93 mmol/L — ABNORMAL LOW (ref 98–111)
Creatinine, Ser: 1.84 mg/dL — ABNORMAL HIGH (ref 0.61–1.24)
GFR, Estimated: 42 mL/min — ABNORMAL LOW (ref >60)
Glucose, Bld: 151 mg/dL — ABNORMAL HIGH (ref 70–99)
Potassium: 3.1 mmol/L — ABNORMAL LOW (ref 3.5–5.1)
Sodium: 122 mmol/L — ABNORMAL LOW (ref 135–145)

## 2023-10-27 LAB — CULTURE, BLOOD (ROUTINE X 2)

## 2023-10-27 LAB — SURGICAL PCR SCREEN
MRSA, PCR: POSITIVE — AB
Staphylococcus aureus: POSITIVE — AB

## 2023-10-27 LAB — MAGNESIUM: Magnesium: 2.3 mg/dL (ref 1.7–2.4)

## 2023-10-27 SURGERY — LAPAROTOMY, EXPLORATORY
Anesthesia: General | Site: Abdomen | Laterality: Right

## 2023-10-27 SURGERY — LAPAROTOMY, EXPLORATORY
Anesthesia: General

## 2023-10-27 MED ORDER — ROCURONIUM BROMIDE 100 MG/10ML IV SOLN
INTRAVENOUS | Status: DC | PRN
  Administered 2023-10-27: 40 mg via INTRAVENOUS

## 2023-10-27 MED ORDER — LACTATED RINGERS IV SOLN: INTRAVENOUS | Status: DC | PRN

## 2023-10-27 MED ORDER — MIDAZOLAM HCL 5 MG/5ML IJ SOLN
INTRAMUSCULAR | Status: DC | PRN
  Administered 2023-10-27: 2 mg via INTRAVENOUS

## 2023-10-27 MED ORDER — MUPIROCIN 2 % EX OINT
1.0000 | TOPICAL_OINTMENT | Freq: Two times a day (BID) | CUTANEOUS | Status: AC
  Administered 2023-10-27 – 2023-10-31 (×10): 1 via NASAL
  Filled 2023-10-27: qty 22

## 2023-10-27 MED ORDER — CHLORHEXIDINE GLUCONATE CLOTH 2 % EX PADS
6.0000 | MEDICATED_PAD | Freq: Every day | CUTANEOUS | Status: DC
  Administered 2023-10-28: 6 via TOPICAL

## 2023-10-27 MED ORDER — ROCURONIUM BROMIDE 10 MG/ML (PF) SYRINGE
PREFILLED_SYRINGE | INTRAVENOUS | Status: AC
  Filled 2023-10-27: qty 10

## 2023-10-27 MED ORDER — VASOPRESSIN 20 UNIT/ML IV SOLN
INTRAVENOUS | Status: AC
  Filled 2023-10-27: qty 1

## 2023-10-27 MED ORDER — MIDAZOLAM HCL 2 MG/2ML IJ SOLN
INTRAMUSCULAR | Status: AC
  Filled 2023-10-27: qty 2

## 2023-10-27 MED ORDER — POTASSIUM CHLORIDE 10 MEQ/50ML IV SOLN
10.0000 meq | INTRAVENOUS | Status: AC
  Administered 2023-10-27 (×6): 10 meq via INTRAVENOUS
  Filled 2023-10-27 (×6): qty 50

## 2023-10-27 MED ORDER — 0.9 % SODIUM CHLORIDE (POUR BTL) OPTIME
TOPICAL | Status: DC | PRN
  Administered 2023-10-27: 3000 mL
  Administered 2023-10-27: 1000 mL

## 2023-10-27 SURGICAL SUPPLY — 39 items
BNDG GAUZE DERMACEA FLUFF 4 (GAUZE/BANDAGES/DRESSINGS) IMPLANT
CANISTER SUCT 3000ML PPV (MISCELLANEOUS) ×2 IMPLANT
COVER SURGICAL LIGHT HANDLE (MISCELLANEOUS) ×2 IMPLANT
DRAPE LAPAROSCOPIC ABDOMINAL (DRAPES) ×2 IMPLANT
DRAPE WARM FLUID 44X44 (DRAPES) ×2 IMPLANT
DRSG OPSITE POSTOP 4X10 (GAUZE/BANDAGES/DRESSINGS) IMPLANT
DRSG OPSITE POSTOP 4X8 (GAUZE/BANDAGES/DRESSINGS) IMPLANT
ELECT BLADE 6.5 EXT (BLADE) IMPLANT
ELECT CAUTERY BLADE 6.4 (BLADE) ×2 IMPLANT
ELECT REM PT RETURN 9FT ADLT (ELECTROSURGICAL) ×2 IMPLANT
ELECTRODE REM PT RTRN 9FT ADLT (ELECTROSURGICAL) ×2 IMPLANT
GAUZE PAD ABD 8X10 STRL (GAUZE/BANDAGES/DRESSINGS) IMPLANT
GAUZE SPONGE 4X4 12PLY STRL (GAUZE/BANDAGES/DRESSINGS) IMPLANT
GLOVE SURG SIGNA 7.5 PF LTX (GLOVE) ×2 IMPLANT
GOWN STRL REUS W/ TWL LRG LVL3 (GOWN DISPOSABLE) ×2 IMPLANT
GOWN STRL REUS W/ TWL XL LVL3 (GOWN DISPOSABLE) ×2 IMPLANT
HANDLE SUCTION POOLE (INSTRUMENTS) ×2 IMPLANT
KIT BASIN OR (CUSTOM PROCEDURE TRAY) ×2 IMPLANT
KIT OSTOMY DRAINABLE 2.75 STR (WOUND CARE) IMPLANT
KIT TURNOVER KIT B (KITS) ×2 IMPLANT
LIGASURE IMPACT 36 18CM CVD LR (INSTRUMENTS) IMPLANT
NS IRRIG 1000ML POUR BTL (IV SOLUTION) ×4 IMPLANT
PACK GENERAL/GYN (CUSTOM PROCEDURE TRAY) ×2 IMPLANT
PAD ARMBOARD 7.5X6 YLW CONV (MISCELLANEOUS) ×2 IMPLANT
PENCIL SMOKE EVACUATOR (MISCELLANEOUS) ×2 IMPLANT
SPECIMEN JAR LARGE (MISCELLANEOUS) IMPLANT
SPONGE T-LAP 18X18 ~~LOC~~+RFID (SPONGE) IMPLANT
STAPLER VISISTAT 35W (STAPLE) ×2 IMPLANT
SUCTION POOLE HANDLE (INSTRUMENTS) ×2 IMPLANT
SUT PDS AB 1 TP1 96 (SUTURE) ×4 IMPLANT
SUT SILK 2 0 SH CR/8 (SUTURE) ×2 IMPLANT
SUT SILK 2 0 TIES 10X30 (SUTURE) ×2 IMPLANT
SUT SILK 3 0 SH CR/8 (SUTURE) ×2 IMPLANT
SUT SILK 3 0 TIES 10X30 (SUTURE) ×2 IMPLANT
SUT VIC AB 3-0 SH 18 (SUTURE) IMPLANT
TAPE CLOTH 4X10 WHT NS (GAUZE/BANDAGES/DRESSINGS) IMPLANT
TOWEL GREEN STERILE (TOWEL DISPOSABLE) ×2 IMPLANT
TOWEL GREEN STERILE FF (TOWEL DISPOSABLE) ×2 IMPLANT
YANKAUER SUCT BULB TIP NO VENT (SUCTIONS) IMPLANT

## 2023-10-27 NOTE — Plan of Care (Signed)
  Problem: Clinical Measurements: Goal: Will remain free from infection Outcome: Progressing Goal: Diagnostic test results will improve Outcome: Progressing   Problem: Skin Integrity: Goal: Risk for impaired skin integrity will decrease Outcome: Progressing   

## 2023-10-27 NOTE — Progress Notes (Signed)
Respiratory alkalosis eLink Physician-Brief Progress Note Patient Name: MAYCO WALROND DOB: [birth year] MRN: [id number]   Date of Service  10/27/2023  HPI/Events of Note  Respiratory alkalosis noted on a.m. labs.  eICU Interventions  Decrease minute ventilation. Order placed and bedside RN informed.     Intervention Category Intermediate Interventions: Other:  Carilyn Goodpasture 10/27/2023, 4:43 AM

## 2023-10-27 NOTE — Progress Notes (Signed)
NAME:  Daniel Butler, MRN:  [id number], DOB:  May 14, 1966, LOS: 3 ADMISSION DATE:  10/24/2023, CONSULTATION DATE:  10/24/2023 REFERRING MD:  Dr. Bedelia Person - CCS, CHIEF COMPLAINT:  Bowel perf   History of Present Illness:  Daniel Butler is a 58 y.o. with a past medical history significant for orthostatic hypotension, chronic diastolic congestive heart failure, persistent atrial fibrillation on Eliquis, CKD stage IIIa, anemia of chronic disease, severe alcohol use disorder, chronic ulcer of the left foot, prior SAH secondary to aneurysm s/p clipping, alcohol use, and depression who presented to the ED 2/14 from [facility] and rehab with complaints of abdominal pain and swelling with associated nausea and small-volume emesis that began day prior to admission.  States he has felt unwell x 3 days.  On ED arrival patient was seen febrile with temperature 103.4, tachypneic, and tachycardic.  Lab work significant for K2.4, glucose 116, creatinine 3.12, anion gap 26, alkaline phosphate 33, albumin 2.5, lactic 10.3, WBC 19.6.  CT abdomen and pelvis obtained which revealed substantial free concerning for acute bowel perforation.  Surgery consulted and patient underwent exploratory laparotomy which revealed diffuse colonic distention with ischemia and gangrenous colonic necrosis with scattered areas of perforation and massive flocculent peritonitis.  Postprocedure patient remained intubated and sedated, PCCM consulted for further management admission.  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  2/17 -exploratory laparotomy, end ileostomy  Interim History / Subjective:  Tolerated overall well Sedated, intubated  Objective   Blood pressure 123/84, pulse 77, temperature (!) 97 F (36.1 C), resp. rate 16, height 6\' 1"  (1.854 m), weight 118.9 kg, SpO2 97%.    Vent Mode: PRVC FiO2 (%):  [30 %] 30 % Set Rate:  [14 bmp-16 bmp] 14 bmp Vt Set:  [600 mL-640 mL] 600 mL PEEP:  [5 cmH20] 5 cmH20 Plateau Pressure:  [16 cmH20] 16 cmH20   Intake/Output Summary (Last 24 hours) at 10/27/2023 1139 Last data filed at 10/27/2023 1000 Gross per 24 hour  Intake 3695.58 ml  Output 1395 ml  Net 2300.58 ml   Filed Weights   10/25/23 0441 10/26/23 0500 10/27/23 0500  Weight: 119.3 kg 118.2 kg 118.9 kg    Physical Exam: General: Chronically ill-appearing HENT: Moist oral mucosa, endotracheal tube in place Eyes: Anicteric clear to Respiratory: Bilaterally Cardiovascular: S1 and appreciated GI: Bowel sounds appreciated Extremities: No edema, no clubbing Neuro: Sedated, pupils pinpoint and equal, minimally reactive GU: Foley in place  I reviewed nursing notes, last 24 h vitals and pain scores, last 48 h intake and output, last 24 h labs and trends, and last 24 h imaging results.  Bcx 10/24/23 GPC with BCID Enteroccus faecalis  Resolved Hospital Problem list     Assessment & Plan:   Post exploratory laparotomy, ileostomy -Tolerated well  Septic shock in the setting of small bowel perforations Enterococcus fecalis Central line holiday -Currently on Zosyn, did receive cefepime, Flagyl, vancomycin -  Remains on micafungin -Follow-up on cultures -Continue stress dose steroids, wean  down  Will need TEE when more stable  Maintain strict n.p.o.  Postoperative ventilator management -Continue mechanical ventilation per ARDS protocol -Target TVol 6-8cc/kgIBW -Target Plateau Pressure < 30cm H20 Target driving pressure less than 15 cm of water -Target PaO2 55-65: titrate PEEP/FiO2 per protocol -Ventilator associated pneumonia prevention protocol  Acute kidney injury on chronic kidney disease stage IIIa -Maintain renal perfusion -Avoid nephrotoxic medications  Chronic diastolic heart failure -Home anticoagulation-Eliquis on hold   Anemia of chronic disease -Continue to monitor  Is at risk for malnutrition   Best Practice (right click and "Reselect all SmartList Selections" daily)   Diet/type: NPO DVT prophylaxis SCD Pressure ulcer(s): N/A GI prophylaxis: PPI Lines: Central line Foley:  Yes, and it is still needed Code Status:  full code Last date of multidisciplinary goals of care discussion:   The patient is critically ill with multiple organ systems failure and requires high complexity decision making for assessment and support, frequent evaluation and titration of therapies, application of advanced monitoring technologies and extensive interpretation of multiple databases. Critical Care Time devoted to patient care services described in this note independent of APP/resident time (if applicable)  is 30 minutes.   Virl Diamond MD [facility] Critical Care Personal pager: See Amion If unanswered, please page CCM On-call: #[phone]

## 2023-10-27 NOTE — Transfer of Care (Signed)
Immediate Anesthesia Transfer of Care Note  Patient: Daniel Butler  Procedure(s) Performed: RE-EXPLORATORY LAPAROTOMY WITH ABDOMINAL CLOSURE ILEOSTOMY (Right: Abdomen)  Patient Location: ICU  Anesthesia Type:General  Level of Consciousness: sedated and Patient remains intubated per anesthesia plan  Airway & Oxygen Therapy: Patient remains intubated per anesthesia plan and Patient placed on Ventilator (see vital sign flow sheet for setting)  Post-op Assessment: Report given to RN and Post -op Vital signs reviewed and stable  Post vital signs: Reviewed and stable  Last Vitals:  Vitals Value Taken Time  BP 138/71   Temp 36.2   Pulse 102   Resp 14   SpO2 100     Last Pain:  Vitals:   10/26/23 0815  TempSrc: Rectal  PainSc:          Complications: No notable events documented.

## 2023-10-27 NOTE — Progress Notes (Signed)
Initial Nutrition Assessment  DOCUMENTATION CODES:   Not applicable  INTERVENTION:   When clinical status allows, recommend begin TF via NG tube: Vital 1.5 at 20 ml/h, increase by 10 ml every 8 hours to goal rate of 60 ml/h.  Prosource TF20 60 ml TID. Provides 2400 kcal, 157 gm protein, 1100 ml free water daily.  NUTRITION DIAGNOSIS:   Inadequate oral intake related to inability to eat as evidenced by NPO status.  GOAL:   Patient will meet greater than or equal to 90% of their needs  MONITOR:   Labs, Skin, I & O's  REASON FOR ASSESSMENT:   Ventilator    ASSESSMENT:   58 yo male admitted with bowel perforation, massive feculent peritonitis. S/P ex lap, subtotal abdominal colectomy 2/14. PMH includes HTN, HLD, gout, brain aneurysm, prior alcohol abuse, former smoker.  Bowel was left in discontinuity 2/14. He returned to the OR 2/17 for washout and end ileostomy placement.  NG tube in place to suction. 500 ml output x 24 hours.   Spoke with patient's wife at bedside. His usual weight is ~220-230 lbs. He was in the hospital in December (CHF, HTN, A fib) and was not eating well during hospitalization. He was discharged to a rehab facility in December and has been there since. PT and OT have been working with him, but he continued to be weak and unable to walk by himself.   Patient is currently intubated on ventilator support MV: 12.3 L/min Temp (24hrs), Avg:98.1 F (36.7 C), Min:97 F (36.1 C), Max:99.1 F (37.3 C)  Propofol: 22.4 ml/hr providing 591 kcal from lipid.   Labs reviewed. Na 122, K 3.1  Medications reviewed and include solu-cortef, fentanyl, levophed, KCl, propofol, vasopressin.  Weight history reviewed.  09/03/23 120.5 kg 09/25/23 107.9 kg 10/24/23 106.6 kg 10/27/23 118.9 kg  NUTRITION - FOCUSED PHYSICAL EXAM:  Flowsheet Row Most Recent Value  Orbital Region Mild depletion  Upper Arm Region No depletion  Thoracic and Lumbar Region No depletion   Buccal Region Unable to assess  Temple Region Mild depletion  Clavicle Bone Region Mild depletion  Clavicle and Acromion Bone Region No depletion  Scapular Bone Region No depletion  Dorsal Hand Mild depletion  Patellar Region Mild depletion  Anterior Thigh Region Mild depletion  Posterior Calf Region Mild depletion  Edema (RD Assessment) Mild  Hair Reviewed  Eyes Unable to assess  Mouth Unable to assess  Skin Reviewed  Nails Reviewed       Diet Order:   Diet Order             Diet NPO time specified  Diet effective now                   EDUCATION NEEDS:   Not appropriate for education at this time  Skin:  Skin Assessment: Reviewed RN Assessment (surgical abd incisions)  Last BM:  2/17 type 6  Height:   Ht Readings from Last 1 Encounters:  10/24/23 6\' 1"  (1.854 m)    Weight:   Wt Readings from Last 1 Encounters:  10/27/23 118.9 kg    Ideal Body Weight:  83.6 kg  BMI:  Body mass index is 34.58 kg/m.  Estimated Nutritional Needs:   Kcal:  2200-2400  Protein:  130-150 gm  Fluid:  2.2-2.4 L   Gabriel Rainwater RD, LDN, CNSC Contact via secure chat. If unavailable, use group chat "RD Inpatient."

## 2023-10-27 NOTE — Progress Notes (Signed)
Patient ID: EURAL HOLZSCHUH, male   DOB: 11-27-1965, 58 y.o.   MRN: [id number]  Pre Procedure note for inpatients:   Daniel Butler has been scheduled for an EXPLORATORY LAPAROTOMY with the potential for ileostomy placement and bowel resection today.   The patient remains critically ill on the vent. The various methods of treatment have been discussed with the patient's wife by phone yesterday by Dr. Azucena Cecil. After consideration of the risks, benefits and treatment options the patient has consented to the planned procedure.   The patient has been seen and labs reviewed. There are no changes in the patient's condition to prevent proceeding with the planned procedure today.  Recent labs:  Lab Results  Component Value Date   WBC 20.9 (H) 10/27/2023   HGB 9.3 (L) 10/27/2023   HCT 27.2 (L) 10/27/2023   PLT 448 (H) 10/27/2023   GLUCOSE 151 (H) 10/27/2023   CHOL 93 (L) 04/22/2022   TRIG 223 (H) 10/25/2023   HDL 31 (L) 04/22/2022   LDLDIRECT 48 10/20/2019   LDLCALC 40 04/22/2022   ALT 7 10/24/2023   AST 44 (H) 10/24/2023   NA 122 (L) 10/27/2023   K 3.1 (L) 10/27/2023   CL 93 (L) 10/27/2023   CREATININE 1.84 (H) 10/27/2023   BUN 29 (H) 10/27/2023   CO2 17 (L) 10/27/2023   TSH 2.377 12/15/2020   INR 4.0 (H) 10/24/2023   HGBA1C 5.6 02/22/2019    Abigail Miyamoto, MD 10/27/2023 7:26 AM

## 2023-10-27 NOTE — Progress Notes (Signed)
Pt down to OR at this time.

## 2023-10-27 NOTE — Progress Notes (Signed)
 Pt back on unit.

## 2023-10-27 NOTE — Op Note (Signed)
Note  Daniel Butler 10/24/2023 - 10/27/2023   Pre-op Diagnosis: open abdomen     Post-op Diagnosis: same  Procedure(s): EXPLORATORY LAPAROTOMY  END ILEOSTOMY  Surgeon(s): Abigail Miyamoto, MD  Anesthesia: General  Staff:  Circulator: Rogers Seeds, RN Scrub Person: Carmela Rima  Estimated Blood Loss: Minimal               Indications: This is a 58 year old gentleman who had undergone an emergent exploratory laparotomy 3 days ago where he was found to have a diffusely ischemic colon.  He underwent a subtotal colectomy and was left in discontinuity with a wound VAC placed.  He remains clinically on the unit.  The decision was made to proceed to the operating room for reexploration with washout and possible ileostomy placement  Findings: The patient's entire small bowel was pink and viable.  The sigmoid colon was distended but viable with no evidence of necrosis as well.  The decision was made to proceed with placement of an end ileostomy  Procedure: The patient was brought from the intensive care unit still intubated into the operating room.  He was placed in a supine position on the operating room table and general anesthesia was induced.  We next removed the wound VAC.  His abdomen was then prepped and draped in usual sterile fashion.  I then removed the inner sponge from the abdomen.  I was able to eviscerate small bowel and other than some mild interloop fluid, the entire small bowel was pink and viable.  I ran from the ligament of Treitz all the way till the staple line at the distal ileum.  The patient's remaining sigmoid colon was distended with gas but was pink and viable with no evidence of necrosis.  At this point I copiously irrigated the abdomen with several liters of saline.  I then made a circular incision in the patient's right lower abdomen with the cautery.  I took this down to the fascia which was then opened with cautery.  I then opened the peritoneum underneath  this.  The small bowel was then pulled out of this opening at the staple line at the end ileostomy.  I then closed the patient's midline fascia with a running #1 looped PDS suture.  I then removed the staple line at the ileum and then created an ileostomy circumferentially with interrupted 3-0 Vicryl sutures.  The ileostomy appeared pink and well-perfused.  I then packed the open wound with wet-to-dry saline soaked Kerlix.  An ostomy appliance was then applied.  The patient tolerated the procedure.  He then remained on the ventilator and was taken in a stable condition from the operating room back to the intensive care unit.          Abigail Miyamoto   Date: 10/27/2023  Time: 8:52 AM

## 2023-10-27 NOTE — Progress Notes (Signed)
Pharmacy Electrolyte Replacement  Recent Labs:  Recent Labs    10/25/23 0439 10/25/23 0544 10/27/23 0433  K 2.7*   < > 3.1*  MG 1.6*   < > 2.3  PHOS 2.8  --   --   CREATININE 2.20*   < > 1.84*   < > = values in this interval not displayed.    Low Critical Values (K </= 2.5, Phos </= 1, Mg </= 1) Present: None  MD Contacted: N/a   Plan: KCL 60 mEq IV   Cedric Fishman, PharmD, BCPS, BCCCP Clinical Pharmacist

## 2023-10-27 NOTE — Anesthesia Postprocedure Evaluation (Signed)
Anesthesia Post Note  Patient: Daniel Butler  Procedure(s) Performed: RE-EXPLORATORY LAPAROTOMY WITH ABDOMINAL CLOSURE ILEOSTOMY (Right: Abdomen)     Patient location during evaluation: ICU Anesthesia Type: General Level of consciousness: patient remains intubated per anesthesia plan Pain management: pain level controlled Vital Signs Assessment: post-procedure vital signs reviewed and stable Respiratory status: patient remains intubated per anesthesia plan Cardiovascular status: blood pressure returned to baseline Postop Assessment: no headache Anesthetic complications: no  No notable events documented.  Last Vitals:  Vitals:   10/27/23 0945 10/27/23 1000  BP:  123/84  Pulse: 77 77  Resp: 16 16  Temp: (!) 36.1 C (!) 36.1 C  SpO2: 98% 97%    Last Pain:  Vitals:   10/26/23 0815  TempSrc: Rectal  PainSc:                  Drey Shaff L Alexey Rhoads

## 2023-10-27 NOTE — Progress Notes (Signed)
Pt to OR at this time. RT will complete scheduled vent check when pt arrives back to unit.

## 2023-10-27 NOTE — Progress Notes (Signed)
[facility]  Date of Admission:  10/24/2023      Total days of antibiotics 3   Cefepime   Zosyn 2/15 >> c             ASSESSMENT: Daniel Butler is a 58 y.o. male admitted with   E Faecalis Bacteremia -  Colonic Perforation -  Fevers have resolved. On piperacillin tazobactam + micafungin for bowel source.  TTE looks normal - will defer TEE for now given critical illness and alternative source.  BCx negative prelim 2/15 Needs line holiday when off pressors.   Colonic Perforation - Back to OR today for end ileostomy - bowel pink and viable. No necrosis noted but distention in sigmoid colon.  -Surgery team following   Sepsis -  Multiorgan Dysfunction -  Ongoing pressors though NE down to 8 mcg, Vaso still on.  Creatinine down today, hopeful for plateau     PLAN: Continue Zosyn + micafungin for colonic perforation and bacteremia tx  Defer TEE for the time being    Principal Problem:   S/P exploratory laparotomy Active Problems:   Septic shock (HCC)    Chlorhexidine Gluconate Cloth  6 each Topical Q0600   Chlorhexidine Gluconate Cloth  6 each Topical Daily   hydrocortisone sod succinate (SOLU-CORTEF) inj  100 mg Intravenous Q12H    HYDROmorphone (DILAUDID) injection  1 mg Intravenous Once   mupirocin ointment  1 Application Nasal BID   mouth rinse  15 mL Mouth Rinse Q2H   pantoprazole (PROTONIX) IV  40 mg Intravenous QHS   sodium chloride flush  10-40 mL Intracatheter Q12H    SUBJECTIVE: Intubated, Wife [name] at the bedside.    Review of Systems: Review of Systems  Unable to perform ROS: Intubated    Allergies  Allergen Reactions   Zetia [Ezetimibe] Nausea And Vomiting    OBJECTIVE: Vitals:   10/27/23 1445 10/27/23 1500 10/27/23 1515 10/27/23 1530  BP:  109/77 112/83 113/84  Pulse: 82 80 (!) 103 88  Resp: 15 14 16 14   Temp: 98.4 F (36.9 C) 98.2 F (36.8 C) 98.1 F (36.7 C) 98.1 F (36.7 C)  TempSrc:       SpO2: 97% 98% 98% 98%  Weight:      Height:       Body mass index is 34.58 kg/m.   Physical Exam Vitals reviewed.  Cardiovascular:     Rate and Rhythm: Normal rate and regular rhythm.  Skin:    General: Skin is warm and dry.     Capillary Refill: Capillary refill takes less than 2 seconds.  Neurological:     Mental Status: He is alert and oriented to person, place, and time.      Lab Results Lab Results  Component Value Date   WBC 20.9 (H) 10/27/2023   HGB 9.3 (L) 10/27/2023   HCT 27.2 (L) 10/27/2023   MCV 82.4 10/27/2023   PLT 448 (H) 10/27/2023    Lab Results  Component Value Date   CREATININE 1.84 (H) 10/27/2023   BUN 29 (H) 10/27/2023   NA 122 (L) 10/27/2023   K 3.1 (L) 10/27/2023   CL 93 (L) 10/27/2023   CO2 17 (L) 10/27/2023    Lab Results  Component Value Date   ALT 7 10/24/2023   AST 44 (H) 10/24/2023   ALKPHOS 23 (L) 10/24/2023   BILITOT 0.8 10/24/2023     Microbiology: Recent Results (from the  past 240 hours)  Blood Culture (routine x 2)     Status: Abnormal   Collection Time: 10/24/23 12:35 PM   Specimen: BLOOD RIGHT ARM  Result Value Ref Range Status   Specimen Description BLOOD RIGHT ARM  Final   Special Requests   Final    BOTTLES DRAWN AEROBIC AND ANAEROBIC Blood Culture results may not be optimal due to an inadequate volume of blood received in culture bottles   Culture  Setup Time   Final    GRAM POSITIVE COCCI AEROBIC BOTTLE ONLY CRITICAL RESULT CALLED TO, READ BACK BY AND VERIFIED WITH: PHARMD KENDALL HMarland Kitchen [phone] FCP Performed at [facility], [address], [place], [place] [address]    Culture ENTEROCOCCUS FAECALIS (A)  Final   Report Status 10/27/2023 FINAL  Final   Organism ID, Bacteria ENTEROCOCCUS FAECALIS  Final      Susceptibility   Enterococcus faecalis - MIC*    AMPICILLIN <=2 SENSITIVE Sensitive     VANCOMYCIN 1 SENSITIVE Sensitive     GENTAMICIN SYNERGY SENSITIVE Sensitive     * ENTEROCOCCUS FAECALIS   Blood Culture ID Panel (Reflexed)     Status: Abnormal   Collection Time: 10/24/23 12:35 PM  Result Value Ref Range Status   Enterococcus faecalis DETECTED (A) NOT DETECTED Final    Comment: CRITICAL RESULT CALLED TO, READ BACK BY AND VERIFIED WITH: PHARMD KENDALL H. [phone] FCP    Enterococcus Faecium NOT DETECTED NOT DETECTED Final   Listeria monocytogenes NOT DETECTED NOT DETECTED Final   Staphylococcus species NOT DETECTED NOT DETECTED Final   Staphylococcus aureus (BCID) NOT DETECTED NOT DETECTED Final   Staphylococcus epidermidis NOT DETECTED NOT DETECTED Final   Staphylococcus lugdunensis NOT DETECTED NOT DETECTED Final   Streptococcus species NOT DETECTED NOT DETECTED Final   Streptococcus agalactiae NOT DETECTED NOT DETECTED Final   Streptococcus pneumoniae NOT DETECTED NOT DETECTED Final   Streptococcus pyogenes NOT DETECTED NOT DETECTED Final   A.calcoaceticus-baumannii NOT DETECTED NOT DETECTED Final   Bacteroides fragilis NOT DETECTED NOT DETECTED Final   Enterobacterales NOT DETECTED NOT DETECTED Final   Enterobacter cloacae complex NOT DETECTED NOT DETECTED Final   Escherichia coli NOT DETECTED NOT DETECTED Final   Klebsiella aerogenes NOT DETECTED NOT DETECTED Final   Klebsiella oxytoca NOT DETECTED NOT DETECTED Final   Klebsiella pneumoniae NOT DETECTED NOT DETECTED Final   Proteus species NOT DETECTED NOT DETECTED Final   Salmonella species NOT DETECTED NOT DETECTED Final   Serratia marcescens NOT DETECTED NOT DETECTED Final   Haemophilus influenzae NOT DETECTED NOT DETECTED Final   Neisseria meningitidis NOT DETECTED NOT DETECTED Final   Pseudomonas aeruginosa NOT DETECTED NOT DETECTED Final   Stenotrophomonas maltophilia NOT DETECTED NOT DETECTED Final   Candida albicans NOT DETECTED NOT DETECTED Final   Candida auris NOT DETECTED NOT DETECTED Final   Candida glabrata NOT DETECTED NOT DETECTED Final   Candida krusei NOT DETECTED NOT DETECTED Final    Candida parapsilosis NOT DETECTED NOT DETECTED Final   Candida tropicalis NOT DETECTED NOT DETECTED Final   Cryptococcus neoformans/gattii NOT DETECTED NOT DETECTED Final   Vancomycin resistance NOT DETECTED NOT DETECTED Final    Comment: Performed at [facility], [address], [place], [place] [address]  Blood Culture (routine x 2)     Status: None (Preliminary result)   Collection Time: 10/24/23 12:40 PM   Specimen: BLOOD RIGHT HAND  Result Value Ref Range Status   Specimen Description BLOOD  RIGHT HAND  Final   Special Requests   Final    AEROBIC BOTTLE ONLY Blood Culture results may not be optimal due to an inadequate volume of blood received in culture bottles   Culture   Final    NO GROWTH 3 DAYS Performed at [facility], [address], [place], [place] [address]    Report Status PENDING  Incomplete  Resp panel by RT-PCR (RSV, Flu A&B, Covid) Anterior Nasal Swab     Status: None   Collection Time: 10/24/23 12:46 PM   Specimen: Anterior Nasal Swab  Result Value Ref Range Status   SARS Coronavirus 2 by RT PCR NEGATIVE NEGATIVE Final   Influenza A by PCR NEGATIVE NEGATIVE Final   Influenza B by PCR NEGATIVE NEGATIVE Final    Comment: (NOTE) The Xpert Xpress SARS-CoV-2/FLU/RSV plus assay is intended as an aid in the diagnosis of influenza from Nasopharyngeal swab specimens and should not be used as a sole basis for treatment. Nasal washings and aspirates are unacceptable for Xpert Xpress SARS-CoV-2/FLU/RSV testing.  Fact Sheet for Patients: BloggerCourse.com  Fact Sheet for Healthcare Providers: SeriousBroker.it  This test is not yet approved or cleared by the [place] FDA and has been authorized for detection and/or diagnosis of SARS-CoV-2 by FDA under an Emergency Use Authorization (EUA). This EUA will remain in effect (meaning this test can be used) for the duration of the COVID-19 declaration under  Section 564(b)(1) of the Act, 21 U.S.C. section 360bbb-3(b)(1), unless the authorization is terminated or revoked.     Resp Syncytial Virus by PCR NEGATIVE NEGATIVE Final    Comment: (NOTE) Fact Sheet for Patients: BloggerCourse.com  Fact Sheet for Healthcare Providers: SeriousBroker.it  This test is not yet approved or cleared by the [place] FDA and has been authorized for detection and/or diagnosis of SARS-CoV-2 by FDA under an Emergency Use Authorization (EUA). This EUA will remain in effect (meaning this test can be used) for the duration of the COVID-19 declaration under Section 564(b)(1) of the Act, 21 U.S.C. section 360bbb-3(b)(1), unless the authorization is terminated or revoked.  Performed at [facility], [address], [place], [place] [address]   MRSA Next Gen by PCR, Nasal     Status: Abnormal   Collection Time: 10/24/23  8:21 PM   Specimen: Nasal Mucosa; Nasal Swab  Result Value Ref Range Status   MRSA by PCR Next Gen DETECTED (A) NOT DETECTED Final    Comment: RESULT CALLED TO, READ BACK BY AND VERIFIED WITH: D HAGGERTY,RN@0007  10/25/23 MK (NOTE) The GeneXpert MRSA Assay (FDA approved for NASAL specimens only), is one component of a comprehensive MRSA colonization surveillance program. It is not intended to diagnose MRSA infection nor to guide or monitor treatment for MRSA infections. Test performance is not FDA approved in patients less than 7 years old. Performed at [facility], [address] [place], [place] [address]   Culture, blood (Routine X 2) w Reflex to ID Panel     Status: None (Preliminary result)   Collection Time: 10/25/23 10:57 AM   Specimen: BLOOD RIGHT ARM  Result Value Ref Range Status   Specimen Description BLOOD RIGHT ARM  Final   Special Requests   Final    BOTTLES DRAWN AEROBIC AND ANAEROBIC Blood Culture results may not be optimal due to an inadequate volume of  blood received in culture bottles   Culture   Final    NO GROWTH 2 DAYS Performed at  [facility], [address] [place], [place], [place] [address]    Report Status PENDING  Incomplete  Culture, blood (Routine X 2) w Reflex to ID Panel     Status: None (Preliminary result)   Collection Time: 10/25/23 10:57 AM   Specimen: BLOOD RIGHT HAND  Result Value Ref Range Status   Specimen Description BLOOD RIGHT HAND  Final   Special Requests   Final    BOTTLES DRAWN AEROBIC AND ANAEROBIC Blood Culture results may not be optimal due to an inadequate volume of blood received in culture bottles   Culture   Final    NO GROWTH 2 DAYS Performed at [facility], [address] [place], [place] [address]    Report Status PENDING  Incomplete  Surgical pcr screen     Status: Abnormal   Collection Time: 10/27/23  5:08 AM   Specimen: Nasal Mucosa; Nasal Swab  Result Value Ref Range Status   MRSA, PCR POSITIVE (A) NEGATIVE Final    Comment: RESULT CALLED TO, READ BACK BY AND VERIFIED WITH: T GUY,RN@[id number]  10/27/23 MK    Staphylococcus aureus POSITIVE (A) NEGATIVE Final    Comment: (NOTE) The Xpert SA Assay (FDA approved for NASAL specimens in patients 45 years of age and older), is one component of a comprehensive surveillance program. It is not intended to diagnose infection nor to guide or monitor treatment. Performed at [facility], [address] [place], [place] [address]     Rexene Alberts, MSN, NP-C [facility]  [email or web address] Pager: [phone] Office: [phone] [facility] Main Line: [phone] *Secure Chat Communication Welcome

## 2023-10-28 ENCOUNTER — Encounter (HOSPITAL_COMMUNITY): Payer: Self-pay | Admitting: Surgery

## 2023-10-28 DIAGNOSIS — Z9889 Other specified postprocedural states: Secondary | ICD-10-CM | POA: Diagnosis not present

## 2023-10-28 DIAGNOSIS — B952 Enterococcus as the cause of diseases classified elsewhere: Secondary | ICD-10-CM | POA: Diagnosis not present

## 2023-10-28 DIAGNOSIS — A4181 Sepsis due to Enterococcus: Secondary | ICD-10-CM | POA: Diagnosis not present

## 2023-10-28 DIAGNOSIS — I5032 Chronic diastolic (congestive) heart failure: Secondary | ICD-10-CM | POA: Diagnosis not present

## 2023-10-28 DIAGNOSIS — R7881 Bacteremia: Secondary | ICD-10-CM | POA: Diagnosis not present

## 2023-10-28 DIAGNOSIS — R6521 Severe sepsis with septic shock: Secondary | ICD-10-CM | POA: Diagnosis not present

## 2023-10-28 LAB — GLUCOSE, CAPILLARY
Glucose-Capillary: 138 mg/dL — ABNORMAL HIGH (ref 70–99)
Glucose-Capillary: 161 mg/dL — ABNORMAL HIGH (ref 70–99)
Glucose-Capillary: 183 mg/dL — ABNORMAL HIGH (ref 70–99)

## 2023-10-28 LAB — CBC
HCT: 25.4 % — ABNORMAL LOW (ref 39.0–52.0)
Hemoglobin: 8.8 g/dL — ABNORMAL LOW (ref 13.0–17.0)
MCH: 28.2 pg (ref 26.0–34.0)
MCHC: 34.6 g/dL (ref 30.0–36.0)
MCV: 81.4 fL (ref 80.0–100.0)
Platelets: 337 10*3/uL (ref 150–400)
RBC: 3.12 MIL/uL — ABNORMAL LOW (ref 4.22–5.81)
RDW: 19.5 % — ABNORMAL HIGH (ref 11.5–15.5)
WBC: 19.8 10*3/uL — ABNORMAL HIGH (ref 4.0–10.5)
nRBC: 0.1 % (ref 0.0–0.2)

## 2023-10-28 LAB — BASIC METABOLIC PANEL
Anion gap: 13 (ref 5–15)
BUN: 25 mg/dL — ABNORMAL HIGH (ref 6–20)
CO2: 18 mmol/L — ABNORMAL LOW (ref 22–32)
Calcium: 7.6 mg/dL — ABNORMAL LOW (ref 8.9–10.3)
Chloride: 93 mmol/L — ABNORMAL LOW (ref 98–111)
Creatinine, Ser: 1.44 mg/dL — ABNORMAL HIGH (ref 0.61–1.24)
GFR, Estimated: 56 mL/min — ABNORMAL LOW (ref >60)
Glucose, Bld: 143 mg/dL — ABNORMAL HIGH (ref 70–99)
Potassium: 3.1 mmol/L — ABNORMAL LOW (ref 3.5–5.1)
Sodium: 124 mmol/L — ABNORMAL LOW (ref 135–145)

## 2023-10-28 LAB — MAGNESIUM: Magnesium: 2.3 mg/dL (ref 1.7–2.4)

## 2023-10-28 LAB — SURGICAL PATHOLOGY

## 2023-10-28 MED ORDER — TRACE MINERALS CU-MN-SE-ZN 300-55-60-3000 MCG/ML IV SOLN
INTRAVENOUS | Status: AC
  Filled 2023-10-28: qty 456

## 2023-10-28 MED ORDER — HYDROCORTISONE SOD SUC (PF) 100 MG IJ SOLR
50.0000 mg | Freq: Every day | INTRAMUSCULAR | Status: DC
  Administered 2023-10-29: 50 mg via INTRAVENOUS
  Filled 2023-10-28: qty 2

## 2023-10-28 MED ORDER — INSULIN ASPART 100 UNIT/ML IJ SOLN
0.0000 [IU] | INTRAMUSCULAR | Status: DC
  Administered 2023-10-28: 1 [IU] via SUBCUTANEOUS
  Administered 2023-10-28 (×2): 2 [IU] via SUBCUTANEOUS
  Administered 2023-10-29: 1 [IU] via SUBCUTANEOUS
  Administered 2023-10-29: 2 [IU] via SUBCUTANEOUS
  Administered 2023-10-29: 1 [IU] via SUBCUTANEOUS
  Administered 2023-10-29 (×2): 2 [IU] via SUBCUTANEOUS
  Administered 2023-10-30 (×4): 1 [IU] via SUBCUTANEOUS
  Administered 2023-10-30: 2 [IU] via SUBCUTANEOUS
  Administered 2023-10-30 – 2023-10-31 (×6): 1 [IU] via SUBCUTANEOUS
  Administered 2023-11-01: 2 [IU] via SUBCUTANEOUS
  Administered 2023-11-01 – 2023-11-02 (×7): 1 [IU] via SUBCUTANEOUS
  Administered 2023-11-02: 2 [IU] via SUBCUTANEOUS
  Administered 2023-11-02 – 2023-11-07 (×11): 1 [IU] via SUBCUTANEOUS

## 2023-10-28 MED ORDER — POTASSIUM CHLORIDE 10 MEQ/50ML IV SOLN
10.0000 meq | INTRAVENOUS | Status: AC
  Administered 2023-10-28 (×6): 10 meq via INTRAVENOUS
  Filled 2023-10-28 (×6): qty 50

## 2023-10-28 MED ORDER — ENOXAPARIN SODIUM 40 MG/0.4ML IJ SOSY
40.0000 mg | PREFILLED_SYRINGE | Freq: Every day | INTRAMUSCULAR | Status: DC
  Administered 2023-10-28 – 2023-10-31 (×4): 40 mg via SUBCUTANEOUS
  Filled 2023-10-28 (×4): qty 0.4

## 2023-10-28 NOTE — Progress Notes (Addendum)
NAME:  Daniel Butler, MRN:  [id number], DOB:  02/03/1966, LOS: 4 ADMISSION DATE:  10/24/2023, CONSULTATION DATE:  10/24/2023 REFERRING MD:  Dr. Bedelia Person - CCS, CHIEF COMPLAINT:  Bowel perf   History of Present Illness:  Daniel Butler is a 58 y.o. 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  States he has felt unwell x 3 days.  On ED arrival patient was seen febrile with temperature 103.4, tachypneic, and tachycardic.  Lab work significant for K2.4, glucose 116, creatinine 3.12, anion gap 26, alkaline phosphate 33, albumin 2.5, lactic 10.3, WBC 19.6.  CT abdomen and pelvis obtained which revealed substantial free air concerning for acute bowel perforation.  Surgery consulted and patient underwent exploratory laparotomy which revealed diffuse colonic distention with ischemia and gangrenous colonic necrosis with scattered areas of perforation and massive flocculent peritonitis.  Postprocedure patient remained intubated and sedated, PCCM consulted for further management admission.  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 OR for subtotal colectomy, intestinal discontinuity  2/15: con't on pressor support, ID consult  2/17: exploratory laparotomy, end ileostomy 2/18: goal to wake up and wean, will need TPN   Interim History / Subjective:  NAEON. Went yesterday for end ileostomy placement. To start TPN today as will likely be some time before he can take tube feeds. Okay to wake up and wean per surgery.   Objective   Blood pressure 131/86, pulse 97, temperature (!) 97 F (36.1 C), resp. rate 18, height 6\' 1"  (1.854 m), weight 118.9 kg, SpO2 98%.    Vent Mode: PRVC FiO2 (%):  [30 %] 30 % Set Rate:  [14 bmp] 14 bmp Vt Set:  [600 mL] 600 mL PEEP:  [5 cmH20] 5 cmH20 Plateau Pressure:  [14 cmH20-15 cmH20] 15 cmH20   Intake/Output Summary (Last 24 hours) at 10/28/2023 1002 Last data filed at 10/28/2023 1478 Gross per 24 hour  Intake 1911.55 ml  Output 795 ml  Net 1116.55 ml   Filed Weights   10/25/23 0441 10/26/23 0500 10/27/23 0500  Weight: 119.3 kg 118.2 kg 118.9 kg    Physical Exam: General: acute on chronically ill appearing male, laying in bed, no acute distress  HENT: anicteric sclera, perrla, mmm, ett, ogt  Respiratory: vented, minimal settings, clear bilaterally  Cardiovascular: irregularly irregular rhythm, no appreciable murmur GI: soft, midline laparotomy dressing cdi, ostomy is pink and beefy  Extremities: anasarca  Neuro: lightly sedated, he attempts to follow commands GU: foley  Bcx 10/24/23 GPC with BCID Enteroccus faecalis  Resolved  Hospital Problem list    Assessment & Plan:  Septic shock in the setting of small bowel perforation s/p ex lap subtotal colectomy and ileostomy  placement Enterococcus fecalis bacteremia  - ID following, appreciate recs  - Surgery following, appreciate post-op management  - con't zosyn, micafungin  - stress dose steroids lowered to 50mg  daily  - wean levophed to MAP goal >65. Suspect still on low dose due to sedation - start lovenox  - follow-up on cultures - needs TEE when stable - strict NPO   Postoperative ventilator management - wean sedation and SBT today  - full mechanical vent support - lung protective ventilation 6-8cc/kg Vt - VAP and PAD bundle in place  - titrate FiO2 to sat goal >92  - maintain peak/plats <30, driving pressures <57    Acute kidney injury on chronic kidney disease stage IIIa, improving - trend bmp, mag, phos - replete elytes - strict I&O - Avoid nephrotoxic agents, renally dose medications - ensure adequate renal perfusion   Chronic diastolic heart failure Repeat echo 2/16 EF 50-55%, no vegetation  -Home anticoagulation-Eliquis on hold- start per surgery   Anemia of chronic disease -Continue to monitor  High risk for protein calorie malnutrition  - starting TPN today  - strict NPO   Best Practice (right click and "Reselect all SmartList Selections" daily)   Diet/type: NPO and TPN DVT prophylaxis LMWH Pressure ulcer(s): N/A GI prophylaxis: PPI Lines: Central line and yes and it is still needed Foley:  Yes, and it is still needed Code Status:  full code Last date of multidisciplinary goals of care discussion: updated wife at bedside 2/18  CC time: 35  Daniel Peru, PA-C [facility] 10/28/23 10:25 AM  Please see [email or web address] for pager details.  From 7A-7P if no response, please call [phone] After hours, please call ELink [phone]

## 2023-10-28 NOTE — Progress Notes (Signed)
Pharmacy Electrolyte Replacement  Recent Labs:  Recent Labs    10/28/23 0552  K 3.1*  MG 2.3  CREATININE 1.44*    Low Critical Values (K </= 2.5, Phos </= 1, Mg </= 1) Present: None  MD Contacted: N/a   Plan: KCL 60 mEq IV   Cedric Fishman, PharmD, BCPS, BCCCP Clinical Pharmacist

## 2023-10-28 NOTE — Progress Notes (Signed)
Afternoon rounds: Starting TPN today  Attempting to wean. Tried SBT but apneic intermittently. Needs to wake up more. Will try again tomorrow.

## 2023-10-28 NOTE — Progress Notes (Signed)
1 Day Post-Op  Subjective: Sedated.  Eyes open but doesn't track to name, attempts a thumbs up for CCM, PA.  Significant other at bedside.  Still on 1 pressor.   Objective: Vital signs in last 24 hours: Temp:  [96.6 F (35.9 C)-99.1 F (37.3 C)] 97 F (36.1 C) (02/18 0826) Pulse Rate:  [51-109] 97 (02/18 0826) Resp:  [14-37] 18 (02/18 0826) BP: (91-132)/(61-91) 131/86 (02/18 0600) SpO2:  [95 %-100 %] 98 % (02/18 0826) Arterial Line BP: (98-148)/(52-77) 122/61 (02/18 0615) FiO2 (%):  [30 %] 30 % (02/18 0826) Last BM Date : 10/27/23  Intake/Output from previous day: 02/17 0701 - 02/18 0700 In: 2323.3 [I.V.:1852.4; IV Piggyback:470.9] Out: 995 [Urine:970; Blood:25] Intake/Output this shift: No intake/output data recorded.  PE: Gen: critically ill-appearing on vent, sedated HEENT: eyes blinking but doesn't track Abd: soft, ND, ileostomy viable with just some old sweat, midline wound is overall clean and packed.  NGT with just water/tan output, no bile, not significant amount.  Currently about 300cc.  Lab Results:  Recent Labs    10/27/23 0433 10/28/23 0552  WBC 20.9* 19.8*  HGB 9.3* 8.8*  HCT 27.2* 25.4*  PLT 448* 337   BMET Recent Labs    10/27/23 0433 10/28/23 0552  NA 122* 124*  K 3.1* 3.1*  CL 93* 93*  CO2 17* 18*  GLUCOSE 151* 143*  BUN 29* 25*  CREATININE 1.84* 1.44*  CALCIUM 7.3* 7.6*   PT/INR No results for input(s): "LABPROT", "INR" in the last 72 hours. CMP     Component Value Date/Time   NA 124 (L) 10/28/2023 0552   NA 139 04/22/2022 0958   K 3.1 (L) 10/28/2023 0552   CL 93 (L) 10/28/2023 0552   CO2 18 (L) 10/28/2023 0552   GLUCOSE 143 (H) 10/28/2023 0552   BUN 25 (H) 10/28/2023 0552   BUN 13 04/22/2022 0958   CREATININE 1.44 (H) 10/28/2023 0552   CREATININE 2.08 (H) 01/04/2022 1417   CALCIUM 7.6 (L) 10/28/2023 0552   PROT 4.0 (L) 10/24/2023 1943   PROT 6.2 04/22/2022 0958   ALBUMIN 1.6 (L) 10/24/2023 1943   ALBUMIN 3.7 (L)  04/22/2022 0958   AST 44 (H) 10/24/2023 1943   ALT 7 10/24/2023 1943   ALKPHOS 23 (L) 10/24/2023 1943   BILITOT 0.8 10/24/2023 1943   BILITOT 0.9 04/22/2022 0958   GFRNONAA 56 (L) 10/28/2023 0552   GFRAA >60 02/16/2016 0610   Lipase     Component Value Date/Time   LIPASE 14 10/24/2023 1943       Studies/Results: No results found.  Anti-infectives: Anti-infectives (From admission, onward)    Start     Dose/Rate Route Frequency Ordered Stop   10/25/23 1400  piperacillin-tazobactam (ZOSYN) IVPB 3.375 g        3.375 g 12.5 mL/hr over 240 Minutes Intravenous Every 8 hours 10/25/23 0942     10/25/23 0000  ceFEPIme (MAXIPIME) 2 g in sodium chloride 0.9 % 100 mL IVPB  Status:  Discontinued        2 g 200 mL/hr over 30 Minutes Intravenous Every 12 hours 10/24/23 1853 10/25/23 0942   10/24/23 2300  piperacillin-tazobactam (ZOSYN) IVPB 3.375 g  Status:  Discontinued        3.375 g 12.5 mL/hr over 240 Minutes Intravenous Every 8 hours 10/24/23 1509 10/24/23 1853   10/24/23 2300  metroNIDAZOLE (FLAGYL) IVPB 500 mg  Status:  Discontinued        500 mg 100  mL/hr over 60 Minutes Intravenous Every 12 hours 10/24/23 1838 10/25/23 0942   10/24/23 2000  micafungin (MYCAMINE) 100 mg in sodium chloride 0.9 % 100 mL IVPB        100 mg 105 mL/hr over 1 Hours Intravenous Every 24 hours 10/24/23 1838     10/24/23 1945  vancomycin (VANCOREADY) IVPB 2000 mg/400 mL        2,000 mg 200 mL/hr over 120 Minutes Intravenous NOW 10/24/23 1854 10/24/23 2239   10/24/23 1854  vancomycin variable dose per unstable renal function (pharmacist dosing)  Status:  Discontinued         Does not apply See admin instructions 10/24/23 1854 10/25/23 0942   10/24/23 1245  ceFEPIme (MAXIPIME) 2 g in sodium chloride 0.9 % 100 mL IVPB        2 g 200 mL/hr over 30 Minutes Intravenous  Once 10/24/23 1236 10/24/23 1334   10/24/23 1245  metroNIDAZOLE (FLAGYL) IVPB 500 mg        500 mg 100 mL/hr over 60 Minutes Intravenous   Once 10/24/23 1236 10/24/23 1453        Assessment/Plan POD 1, 3 s/p ex lap with subtotal abdominal colectomy in discontinuity, Dr. 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 Magnus Ivan 2/17 for pneumoperitoneum and gangrene of colon -may wake up and wean to extubate per CCM.  Abdomen is closed and no plans to return to OR -given contamination, suspect ileus.  Will start TNA today and try to concentrate as able given amount of fluid he has received/receiving.   - his bacteremia is certainly secondary to his perforated colon and gross feculent contamination.   -patient had a picc line placed this admission, so will use this for TNA.   -WOC consult for ileostomy -cont NGT and await bowel function -NS wD dressing changes to midline wound for now.  If remains clean, could VAC eventually. -cont abx therapy, ID fllowing -d/w pharmacy as well as CCM, PA on ward.  FEN - NPO, NGT, TNA, lytes replaced today already VTE - Lovenox ID - zosyn, micafungin  Sepsis secondary to above as well as bacteremia - E Faecalis source of bacteremia secondary to colon perforation.  Per ID Acute hypoxic respiratory failure/post op vent management - per CCM, may wean to extubate as able, abdomen is closed with no further plans to return to OR Anemia  Chronic diastolic HF AKI    LOS: 4 days    Letha Cape , [facility] 10/28/2023, 10:23 AM Please see Amion for pager number during day hours 7:00am-4:30pm or 7:00am -11:30am on weekends

## 2023-10-28 NOTE — TOC Initial Note (Signed)
Transition of Care [facility]) - Initial/Assessment Note    Patient Details  Name: Daniel Butler MRN: [id number] Date of Birth: 11/12/1965  [facility] CM/SW Contact:    Mearl Latin, LCSW Phone Number: 10/28/2023, 5:26 PM  Clinical Narrative:                 Patient admitted from [facility]. CSW will follow for return needs.   Expected Discharge Plan: Skilled Nursing Facility Barriers to Discharge: Continued Medical Work up   Patient Goals and CMS Choice Patient states their goals for this hospitalization and ongoing recovery are:: Return to SNF          Expected Discharge Plan and Services In-house Referral: Clinical Social Work   Post Acute Care Choice: Skilled Nursing Facility Living arrangements for the past 2 months: Skilled Nursing Facility                                      Prior Living Arrangements/Services Living arrangements for the past 2 months: Skilled Nursing Facility   Patient language and need for interpreter reviewed:: Yes Do you feel safe going back to the place where you live?: Yes      Need for Family Participation in Patient Care: Yes (Comment) Care giver support system in place?: Yes (comment)   Criminal Activity/Legal Involvement Pertinent to Current Situation/Hospitalization: No - Comment as needed  Activities of Daily Living      Permission Sought/Granted Permission sought to share information with : Facility Medical sales representative, Family Supports Permission granted to share information with : No  Share Information with NAME: [name]  Permission granted to share info w AGENCY: [facility]  Permission granted to share info w Relationship: Spouse  Permission granted to share info w Contact Information: [phone]  Emotional Assessment Appearance:: Appears stated age Attitude/Demeanor/Rapport: Unable to Assess, Intubated (Following Commands or Not Following Commands) Affect (typically observed):  Unable to Assess Orientation: :  (intubated) Alcohol / Substance Use: Not Applicable Psych Involvement: No (comment)  Admission diagnosis:  S/P exploratory laparotomy [Z98.890] Septic shock (HCC) [A41.9, R65.21] Patient Active Problem List   Diagnosis Date Noted   S/P exploratory laparotomy 10/24/2023   Septic shock (HCC) 10/24/2023   Chronic ulcer of left foot (HCC) 09/20/2023   Acute on chronic diastolic CHF (congestive heart failure) (HCC) 09/18/2023   Persistent atrial fibrillation (HCC) 09/18/2023   Alcohol use disorder, severe, dependence (HCC) 09/18/2023   Chronic kidney disease, stage 3a (HCC) 09/18/2023   Anemia of chronic disease 09/18/2023   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   Benign neoplasm of colon 09/02/2023   Iron deficiency anemia 08/31/2023   Dysphagia 08/31/2023   Heart failure with reduced ejection fraction (HCC) 08/31/2023   Hypokalemia 08/31/2023   Umbilical hernia 08/31/2023   Acute combined systolic and diastolic heart failure (HCC)    Alcohol abuse with intoxication (HCC) 12/15/2020   Transient hypotension 12/15/2020   Acute metabolic encephalopathy 12/15/2020   AKI (acute kidney injury) (HCC) 12/15/2020   History of subarachnoid hemorrhage 12/15/2020   Hypoalbuminemia 12/15/2020   Atrial fibrillation with RVR (HCC) 12/15/2020   Bilateral lower extremity edema 12/15/2020   Fall at home, initial encounter 12/15/2020   Alcohol abuse 06/02/2018   Tobacco abuse 06/02/2018   Hypertriglyceridemia 01/19/2018   Essential hypertension 10/02/2017   Sciatica of right side 04/11/2017   Sleep disturbance 05/19/2015   Anxiety state 05/19/2015  Generalized headaches 04/18/2015   Gout 04/18/2015   Encounter for orogastric (OG) tube placement    Subarachnoid hemorrhage (HCC) 03/02/2015   Acute respiratory failure with hypoxia (HCC)    SAH (subarachnoid hemorrhage) (HCC)    Subarachnoid bleed (HCC)    Malignant hypertension    PCP:  Olive Bass,  FNP Pharmacy:   [facility] [address] [place], [place] - [address] [address] [place] [address] Phone: [phone] Fax: [phone]  [id number] - [place], [place] - [address] AT [facility] [address] [place] [address] Phone: [phone] Fax: [phone]  [facility] [place], [place] - [address] [phone] [place] [address] Phone: [phone] Fax: [phone]     Social Drivers of Health (SDOH) Social History: SDOH Screenings   Food Insecurity: Patient Unable To Answer (10/27/2023)  Housing: Patient Unable To Answer (10/27/2023)  Transportation Needs: Patient Unable To Answer (10/27/2023)  Utilities: Patient Unable To Answer (10/27/2023)  Alcohol Screen: Low Risk  (12/20/2022)  Depression (PHQ2-9): Low Risk  (12/20/2022)  Financial Resource Strain: High Risk (12/20/2022)  Physical Activity: Unknown (12/20/2022)  Social Connections: Socially Isolated (12/20/2022)  Stress: Stress Concern Present (12/20/2022)  Tobacco Use: Medium Risk (10/24/2023)   SDOH Interventions:     Readmission Risk Interventions    10/28/2023    5:25 PM 09/01/2023    4:24 PM  Readmission Risk Prevention Plan  Transportation Screening Complete Complete  PCP or Specialist Appt within 5-7 Days  Complete  Home Care Screening  Complete  Medication Review (RN CM)  Complete  Medication Review (RN Care Manager) Complete   PCP or Specialist appointment within 3-5 days of discharge Complete   HRI or Home Care Consult Complete   SW Recovery Care/Counseling Consult Complete   Palliative Care Screening Not Applicable   Skilled Nursing Facility Complete

## 2023-10-28 NOTE — Progress Notes (Signed)
[facility]  Date of Admission:  10/24/2023      Total days of antibiotics 4   Cefepime   Zosyn 2/15 >> c             ASSESSMENT: Daniel Butler is a 58 y.o. male admitted with   E Faecalis Bacteremia -  Colonic Perforation -  Fevers have resolved. On piperacillin tazobactam + micafungin for bowel source.  TTE looks normal - will defer TEE for now given critical illness and alternative source.  BCx negative prelim 2/15 Needs line holiday when off pressors.   Colonic Perforation - Back to OR 2/17 for end ileostomy - bowel pink and viable. No necrosis noted but distention in sigmoid colon.  -Surgery team following   Sepsis -  Multiorgan Dysfunction -  Down to lower dose NE infusion now. Sepsis physiology improved.  Working on extubation.    PLAN: Continue Zosyn + micafungin for colonic perforation and bacteremia tx  Defer TEE for the time being    Principal Problem:   S/P exploratory laparotomy Active Problems:   Septic shock (HCC)    Chlorhexidine Gluconate Cloth  6 each Topical Q0600   enoxaparin (LOVENOX) injection  40 mg Subcutaneous Daily   hydrocortisone sod succinate (SOLU-CORTEF) inj  100 mg Intravenous Q12H    HYDROmorphone (DILAUDID) injection  1 mg Intravenous Once   insulin aspart  0-9 Units Subcutaneous Q4H   mupirocin ointment  1 Application Nasal BID   mouth rinse  15 mL Mouth Rinse Q2H   pantoprazole (PROTONIX) IV  40 mg Intravenous QHS   sodium chloride flush  10-40 mL Intracatheter Q12H    SUBJECTIVE: Intubated, Wife [name] at the bedside.  Weaning on ventilator now   Review of Systems: Review of Systems  Unable to perform ROS: Intubated    Allergies  Allergen Reactions   Zetia [Ezetimibe] Nausea And Vomiting    OBJECTIVE: Vitals:   10/28/23 0600 10/28/23 0615 10/28/23 0826 10/28/23 1140  BP: 131/86     Pulse: 64 60 97 (!) 113  Resp: 14 14 18 17   Temp: (!) 96.6 F (35.9 C) (!) 96.6 F  (35.9 C) (!) 97 F (36.1 C) 98.4 F (36.9 C)  TempSrc:      SpO2: 100% 99% 98% 99%  Weight:      Height:       Body mass index is 34.58 kg/m.   Physical Exam Vitals reviewed.  Constitutional:      Comments: Gaze slowly tracking to voice.   Cardiovascular:     Rate and Rhythm: Normal rate and regular rhythm.  Abdominal:     Tenderness: There is abdominal tenderness.  Skin:    General: Skin is warm and dry.     Capillary Refill: Capillary refill takes less than 2 seconds.  Neurological:     Mental Status: He is alert and oriented to person, place, and time.      Lab Results Lab Results  Component Value Date   WBC 19.8 (H) 10/28/2023   HGB 8.8 (L) 10/28/2023   HCT 25.4 (L) 10/28/2023   MCV 81.4 10/28/2023   PLT 337 10/28/2023    Lab Results  Component Value Date   CREATININE 1.44 (H) 10/28/2023   BUN 25 (H) 10/28/2023   NA 124 (L) 10/28/2023   K 3.1 (L) 10/28/2023   CL 93 (L) 10/28/2023   CO2 18 (L) 10/28/2023    Lab Results  Component Value Date   ALT 7 10/24/2023   AST 44 (H) 10/24/2023   ALKPHOS 23 (L) 10/24/2023   BILITOT 0.8 10/24/2023     Microbiology: Recent Results (from the past 240 hours)  Blood Culture (routine x 2)     Status: Abnormal   Collection Time: 10/24/23 12:35 PM   Specimen: BLOOD RIGHT ARM  Result Value Ref Range Status   Specimen Description BLOOD RIGHT ARM  Final   Special Requests   Final    BOTTLES DRAWN AEROBIC AND ANAEROBIC Blood Culture results may not be optimal due to an inadequate volume of blood received in culture bottles   Culture  Setup Time   Final    GRAM POSITIVE COCCI AEROBIC BOTTLE ONLY CRITICAL RESULT CALLED TO, READ BACK BY AND VERIFIED WITH: PHARMD KENDALL H. [phone] FCP Performed at [facility], [address] [place], [place] [address]    Culture ENTEROCOCCUS FAECALIS (A)  Final   Report Status 10/27/2023 FINAL  Final   Organism ID, Bacteria ENTEROCOCCUS FAECALIS  Final      Susceptibility    Enterococcus faecalis - MIC*    AMPICILLIN <=2 SENSITIVE Sensitive     VANCOMYCIN 1 SENSITIVE Sensitive     GENTAMICIN SYNERGY SENSITIVE Sensitive     * ENTEROCOCCUS FAECALIS  Blood Culture ID Panel (Reflexed)     Status: Abnormal   Collection Time: 10/24/23 12:35 PM  Result Value Ref Range Status   Enterococcus faecalis DETECTED (A) NOT DETECTED Final    Comment: CRITICAL RESULT CALLED TO, READ BACK BY AND VERIFIED WITH: PHARMD KENDALL H. [phone] FCP    Enterococcus Faecium NOT DETECTED NOT DETECTED Final   Listeria monocytogenes NOT DETECTED NOT DETECTED Final   Staphylococcus species NOT DETECTED NOT DETECTED Final   Staphylococcus aureus (BCID) NOT DETECTED NOT DETECTED Final   Staphylococcus epidermidis NOT DETECTED NOT DETECTED Final   Staphylococcus lugdunensis NOT DETECTED NOT DETECTED Final   Streptococcus species NOT DETECTED NOT DETECTED Final   Streptococcus agalactiae NOT DETECTED NOT DETECTED Final   Streptococcus pneumoniae NOT DETECTED NOT DETECTED Final   Streptococcus pyogenes NOT DETECTED NOT DETECTED Final   A.calcoaceticus-baumannii NOT DETECTED NOT DETECTED Final   Bacteroides fragilis NOT DETECTED NOT DETECTED Final   Enterobacterales NOT DETECTED NOT DETECTED Final   Enterobacter cloacae complex NOT DETECTED NOT DETECTED Final   Escherichia coli NOT DETECTED NOT DETECTED Final   Klebsiella aerogenes NOT DETECTED NOT DETECTED Final   Klebsiella oxytoca NOT DETECTED NOT DETECTED Final   Klebsiella pneumoniae NOT DETECTED NOT DETECTED Final   Proteus species NOT DETECTED NOT DETECTED Final   Salmonella species NOT DETECTED NOT DETECTED Final   Serratia marcescens NOT DETECTED NOT DETECTED Final   Haemophilus influenzae NOT DETECTED NOT DETECTED Final   Neisseria meningitidis NOT DETECTED NOT DETECTED Final   Pseudomonas aeruginosa NOT DETECTED NOT DETECTED Final   Stenotrophomonas maltophilia NOT DETECTED NOT DETECTED Final   Candida albicans NOT  DETECTED NOT DETECTED Final   Candida auris NOT DETECTED NOT DETECTED Final   Candida glabrata NOT DETECTED NOT DETECTED Final   Candida krusei NOT DETECTED NOT DETECTED Final   Candida parapsilosis NOT DETECTED NOT DETECTED Final   Candida tropicalis NOT DETECTED NOT DETECTED Final   Cryptococcus neoformans/gattii NOT DETECTED NOT DETECTED Final   Vancomycin resistance NOT DETECTED NOT DETECTED Final    Comment: Performed at [facility], [address], [place], [place] [address]  Blood Culture (routine  x 2)     Status: None (Preliminary result)   Collection Time: 10/24/23 12:40 PM   Specimen: BLOOD RIGHT HAND  Result Value Ref Range Status   Specimen Description BLOOD RIGHT HAND  Final   Special Requests   Final    AEROBIC BOTTLE ONLY Blood Culture results may not be optimal due to an inadequate volume of blood received in culture bottles   Culture   Final    NO GROWTH 4 DAYS Performed at [facility], [address], [place], [place] [address]    Report Status PENDING  Incomplete  Resp panel by RT-PCR (RSV, Flu A&B, Covid) Anterior Nasal Swab     Status: None   Collection Time: 10/24/23 12:46 PM   Specimen: Anterior Nasal Swab  Result Value Ref Range Status   SARS Coronavirus 2 by RT PCR NEGATIVE NEGATIVE Final   Influenza A by PCR NEGATIVE NEGATIVE Final   Influenza B by PCR NEGATIVE NEGATIVE Final    Comment: (NOTE) The Xpert Xpress SARS-CoV-2/FLU/RSV plus assay is intended as an aid in the diagnosis of influenza from Nasopharyngeal swab specimens and should not be used as a sole basis for treatment. Nasal washings and aspirates are unacceptable for Xpert Xpress SARS-CoV-2/FLU/RSV testing.  Fact Sheet for Patients: BloggerCourse.com  Fact Sheet for Healthcare Providers: SeriousBroker.it  This test is not yet approved or cleared by the [place] FDA and has been authorized for detection and/or diagnosis of  SARS-CoV-2 by FDA under an Emergency Use Authorization (EUA). This EUA will remain in effect (meaning this test can be used) for the duration of the COVID-19 declaration under Section 564(b)(1) of the Act, 21 U.S.C. section 360bbb-3(b)(1), unless the authorization is terminated or revoked.     Resp Syncytial Virus by PCR NEGATIVE NEGATIVE Final    Comment: (NOTE) Fact Sheet for Patients: BloggerCourse.com  Fact Sheet for Healthcare Providers: SeriousBroker.it  This test is not yet approved or cleared by the [place] FDA and has been authorized for detection and/or diagnosis of SARS-CoV-2 by FDA under an Emergency Use Authorization (EUA). This EUA will remain in effect (meaning this test can be used) for the duration of the COVID-19 declaration under Section 564(b)(1) of the Act, 21 U.S.C. section 360bbb-3(b)(1), unless the authorization is terminated or revoked.  Performed at [facility], [address], [place], [place] [address]   MRSA Next Gen by PCR, Nasal     Status: Abnormal   Collection Time: 10/24/23  8:21 PM   Specimen: Nasal Mucosa; Nasal Swab  Result Value Ref Range Status   MRSA by PCR Next Gen DETECTED (A) NOT DETECTED Final    Comment: RESULT CALLED TO, READ BACK BY AND VERIFIED WITH: D HAGGERTY,RN@0007  10/25/23 MK (NOTE) The GeneXpert MRSA Assay (FDA approved for NASAL specimens only), is one component of a comprehensive MRSA colonization surveillance program. It is not intended to diagnose MRSA infection nor to guide or monitor treatment for MRSA infections. Test performance is not FDA approved in patients less than 4 years old. Performed at [facility], [address] [place], [place] [address]   Culture, blood (Routine X 2) w Reflex to ID Panel     Status: None (Preliminary result)   Collection Time: 10/25/23 10:57 AM   Specimen: BLOOD RIGHT ARM  Result Value Ref Range Status    Specimen Description BLOOD RIGHT ARM  Final   Special Requests   Final    BOTTLES DRAWN AEROBIC AND  ANAEROBIC Blood Culture results may not be optimal due to an inadequate volume of blood received in culture bottles   Culture   Final    NO GROWTH 3 DAYS Performed at [facility], [address], [place], [place] [address]    Report Status PENDING  Incomplete  Culture, blood (Routine X 2) w Reflex to ID Panel     Status: None (Preliminary result)   Collection Time: 10/25/23 10:57 AM   Specimen: BLOOD RIGHT HAND  Result Value Ref Range Status   Specimen Description BLOOD RIGHT HAND  Final   Special Requests   Final    BOTTLES DRAWN AEROBIC AND ANAEROBIC Blood Culture results may not be optimal due to an inadequate volume of blood received in culture bottles   Culture   Final    NO GROWTH 3 DAYS Performed at [facility], [address] [place], [place] [address]    Report Status PENDING  Incomplete  Surgical pcr screen     Status: Abnormal   Collection Time: 10/27/23  5:08 AM   Specimen: Nasal Mucosa; Nasal Swab  Result Value Ref Range Status   MRSA, PCR POSITIVE (A) NEGATIVE Final    Comment: RESULT CALLED TO, READ BACK BY AND VERIFIED WITH: T GUY,RN@[id number]  10/27/23 MK    Staphylococcus aureus POSITIVE (A) NEGATIVE Final    Comment: (NOTE) The Xpert SA Assay (FDA approved for NASAL specimens in patients 71 years of age and older), is one component of a comprehensive surveillance program. It is not intended to diagnose infection nor to guide or monitor treatment. Performed at [facility], [address], [place], [place] [address]     Rexene Alberts, MSN, NP-C [facility]  [email or web address] Pager: [phone] Office: [phone] [facility] Main Line: [phone] *Secure Chat Communication Welcome

## 2023-10-28 NOTE — Progress Notes (Signed)
Nutrition Follow-up  DOCUMENTATION CODES:   Not applicable  INTERVENTION:   TPN to meet nutrition needs. Discussed with Pharmacy   Once able to tolerate EN recommend:  Vital 1.5 @ 20 ml/hr increasing by 10 ml every 8 hours to goal rate of 60 ml/hr 60 ml Prosource TF20 TID Provides: 2400 kcal, 157 grams protein and 1100 ml free water  NUTRITION DIAGNOSIS:   Inadequate oral intake related to inability to eat as evidenced by NPO status. Ongoing.   GOAL:   Patient will meet greater than or equal to 90% of their needs Progressing with TPN initiation   MONITOR:   Labs, Skin, I & O's  REASON FOR ASSESSMENT:   Consult New TPN/TNA  ASSESSMENT:   58 yo male admitted with bowel perforation, massive feculent peritonitis. S/P ex lap, subtotal abdominal colectomy 2/14. PMH includes HTN, HLD, gout, brain aneurysm, prior alcohol abuse, former smoker.  Spoke with RN and Pharmacy.  Plan to start TPN today 2/18. Abd now closed, per CCS may wean to extubate as appropriate. Pt now more awake and following simple commands per RN.  NG tube in place for suction only about 200 ml out this shift.  Treating for E faecalis bacteremia secondary to colon perforation.    2/14 -  s/p ex lap with subtotal abd colectomy left in discontinuity  2/15 - s/p re-exploration with creation of end ileostomy and closure  2/17 - s/p OR washout and end ileostomy    Medications reviewed and include: solu-cortef, SSI every 4 hours, protonix Amiodarone Fentanyl  Levophed  10 mEq KCl x 6  Vasopressin @ 0.02 units Propofol @ 19 ml/hr provides: 501 kcal   Labs reviewed:  Na 124 K 3.1 TG 223   OG tube to suction, no abd xray noted but can see tube in chest xray but unable to see tip   Diet Order:   Diet Order             Diet NPO time specified  Diet effective now                   EDUCATION NEEDS:   Not appropriate for education at this time  Skin:  Skin Assessment: Reviewed RN  Assessment (surgical abd incisions)  Last BM:  2/17 type 6  Height:   Ht Readings from Last 1 Encounters:  10/24/23 6\' 1"  (1.854 m)    Weight:   Wt Readings from Last 1 Encounters:  10/27/23 118.9 kg    Ideal Body Weight:  83.6 kg  BMI:  Body mass index is 34.58 kg/m.  Estimated Nutritional Needs:   Kcal:  2200-2400  Protein:  130-150 gm  Fluid:  2.2-2.4 L  Helmut Hennon P., RD, LDN, CNSC See AMiON for contact information

## 2023-10-28 NOTE — Consult Note (Addendum)
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 Stomal assessment/size: 28x56mm, round, flat on the skin level. Stoma red and viable. Peristomal assessment: pouch in place. Not able to assess. No sign of leaking. Treatment options for stomal/peristomal skin:  Output 20ml of forming stools at the bag 0840. Ostomy pouching: 1pc. Soft convex [id number]. Barrier ring [phone]  Bed nurse can change the pouch system Thursday and Monday, or if is leaking.  - Cleansed the skin with water/saline. Keep the skin clean and dry before apply the next barrier. - Cut the barrier on the size and shape as the ostomy. - Apply the ring surrounding the cut. - Apply on the skin, making a good seal, protecting the skin against the effluent. - Empty the pouch when 1/3 full.  Ordered supplies in the room.  Education provided: Patient is not able to learn, sedated and intubated. Enrolled patient in [facility] DC program: No   WOC team will follow Thursday. Please reconsult if further assistance is needed. Thank-you,  Denyse Amass BSN, RN, [employer], WOC  (Pager: [phone])

## 2023-10-28 NOTE — Progress Notes (Addendum)
PHARMACY - TOTAL PARENTERAL NUTRITION CONSULT NOTE   Indication: Prolonged ileus  Patient Measurements: Height: 6\' 1"  (185.4 cm) Weight: 118.9 kg (262 lb 2 oz) IBW/kg (Calculated) : 79.9 TPN AdjBW (KG): 86.6 Body mass index is 34.58 kg/m. Usual Weight: 120-130 kg   Assessment:  Patient admitted 2/14 with septic shock in the setting of bowel perforation. Patient has enterococcus bacteremia. TTE on 2/16 negative for vegetations. ID following. Due to prolonged ileus postoperatively, pharmacy consulted to start TPN on 2/18.   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   Glucose / Insulin: CBGs < 180. HbA1C 5.6% in 2020 (PTA empagliflozin for heart failure). On hydrocortisone 100 mg q12h.  Electrolytes: Na 124, K 3.1, Cl 93, CO2 18, Mg 2.3, last phos 2.8 (2/14) Renal: BUN 25, Scr 1.44 (improving from 3.12 on admission, baseline ~ 1.5)  Hepatic:  LFTs WNL, last albumin 1.6 (2/14) Intake / Output; MIVF: 940 mL (0.3 mL/kg/hr) UOP in last 24 hours  GI Imaging:  2/14 CT Ab/P - Substantial free intraperitoneal gas with ascites, exact site of perforation not well seen although colonic etiology favored given the diffuse colonic distension.  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; ABThera wound VAC application  2/17 exploratory laparotomy, end ileostomy, fascia closure   Central access: PICC 2/15  TPN start date: 2/18   Nutritional Goals: Goal TPN rate is 60 mL/hr (provides 137 g of protein and 2200 kcals per day) with current rate of propofol infusion  If propofol is discontinued, goal TPN rate will be 75 mL/hr with 75 g/L AA 15%, 20% dextrose, and 30% lipid  RD  Assessment: Estimated Needs Total Energy Estimated Needs: 2200-2400 Total Protein Estimated Needs: 130-150 gm Total Fluid Estimated Needs: 2.2-2.4 L  Current Nutrition:  NPO  Plan:  Start TPN at 30 mL/hr at 1800 Electrolytes in TPN: Na 80mEq/L, K 67mEq/L, Ca 48mEq/L, Mg 0 mEq/L, and Phos 2mmol/L. Max acetate  Add standard MVI and trace elements to TPN Initiate Sensitive q4h SSI and adjust as needed  Monitor TPN labs on Mon/Thurs, also mg, phos, TG tomorrow  60 mEq KCL replaced outside of TPN   Cedric Fishman 10/28/2023,10:35 AM

## 2023-10-28 NOTE — Progress Notes (Signed)
Pharmacy Antibiotic Note  Daniel Butler is a 58 y.o. male admitted on 10/24/2023 with  intra-abdominal infection, perforated bowel .  Pharmacy has been consulted for zosyn dosing.  Serum creatinine continues to improve - 1.44 today.   Plan: Zosyn 3.375g IV q 8h - extended infusion - continue  F/u LOT, renal function and culture data    Height: 6\' 1"  (185.4 cm) Weight: 118.9 kg (262 lb 2 oz) IBW/kg (Calculated) : 79.9  Temp (24hrs), Avg:97.8 F (36.6 C), Min:96.6 F (35.9 C), Max:99.1 F (37.3 C)  Recent Labs  Lab 10/24/23 1244 10/24/23 1943 10/24/23 2202 10/25/23 0057 10/25/23 0439 10/25/23 1355 10/26/23 0338 10/27/23 0433 10/28/23 0552  WBC  --  8.2  --   --  14.2*  --  21.9* 20.9* 19.8*  CREATININE 2.80* 2.53*  --   --  2.20* 1.99* 2.05* 1.84* 1.44*  LATICACIDVEN 10.3*  --  8.1* 8.0*  --  3.0* 2.7*  --   --     Estimated Creatinine Clearance: 75.5 mL/min (A) (by C-G formula based on SCr of 1.44 mg/dL (H)).    Allergies  Allergen Reactions   Zetia [Ezetimibe] Nausea And Vomiting    Antimicrobials this admission: Zosyn 2/15> Micafungin 2/14> Vanc 2/14 x1 Cefpeime 2/14>2/15 Flagyl 2/14>2/15  Dose adjustments this admission:   Microbiology results: 2/14 BCx: 1/4 E faecalis  2/14 Trach asp: 2/14 MRSA PCR: pos 2/14 RVP neg 2/15 Blood Cx NGTD x3   Thank you for allowing pharmacy to be a part of this patient's care.  Cedric Fishman, PharmD, BCPS, BCCCP Clinical Pharmacist

## 2023-10-28 NOTE — TOC CM/SW Note (Signed)
Transition of Care [facility]) - Inpatient Brief Assessment   Patient Details  Name: Daniel Butler MRN: [id number] Date of Birth: 04-25-1966  Transition of [facility] [place] CM/SW Contact:    Glennon Mac, RN Phone Number: 10/28/2023, 4:18 PM   Clinical Narrative: 58yo M admitted with sepsis due to colonic perforation. Found to have e.faecalis bacteremia. Went back to OR and no further necrosis. Was able to have end ileostomy on 10/27/2023. Patient remains on vent wean, pressors; plan to start TPN today. Patient has spouse; he is currently residing at [facility] and [address].  Hopeful for extubation soon and start therapies.    Transition of Care Asessment: Insurance and Status: Insurance coverage has been reviewed Patient has primary care physician: Yes Ria Clock) Home environment has been reviewed: Facility resident; [facility] Prior level of function:: Unknown Prior/Current Home Services: No current home services Social Drivers of Health Review: SDOH reviewed needs interventions Readmission risk has been reviewed: Yes Transition of care needs: transition of care needs identified, TOC will continue to follow  Quintella Baton, RN, BSN  Trauma/Neuro ICU Case Manager [phone]

## 2023-10-29 DIAGNOSIS — R7881 Bacteremia: Secondary | ICD-10-CM | POA: Diagnosis not present

## 2023-10-29 DIAGNOSIS — A4181 Sepsis due to Enterococcus: Secondary | ICD-10-CM | POA: Diagnosis not present

## 2023-10-29 DIAGNOSIS — R6521 Severe sepsis with septic shock: Secondary | ICD-10-CM | POA: Diagnosis not present

## 2023-10-29 DIAGNOSIS — Z9889 Other specified postprocedural states: Secondary | ICD-10-CM | POA: Diagnosis not present

## 2023-10-29 DIAGNOSIS — K650 Generalized (acute) peritonitis: Secondary | ICD-10-CM | POA: Diagnosis not present

## 2023-10-29 DIAGNOSIS — B952 Enterococcus as the cause of diseases classified elsewhere: Secondary | ICD-10-CM | POA: Diagnosis not present

## 2023-10-29 LAB — GLUCOSE, CAPILLARY
Glucose-Capillary: 148 mg/dL — ABNORMAL HIGH (ref 70–99)
Glucose-Capillary: 142 mg/dL — ABNORMAL HIGH (ref 70–99)
Glucose-Capillary: 175 mg/dL — ABNORMAL HIGH (ref 70–99)
Glucose-Capillary: 162 mg/dL — ABNORMAL HIGH (ref 70–99)
Glucose-Capillary: 172 mg/dL — ABNORMAL HIGH (ref 70–99)
Glucose-Capillary: 162 mg/dL — ABNORMAL HIGH (ref 70–99)

## 2023-10-29 LAB — CBC
HCT: 24.6 % — ABNORMAL LOW (ref 39.0–52.0)
Hemoglobin: 8.1 g/dL — ABNORMAL LOW (ref 13.0–17.0)
MCH: 27.2 pg (ref 26.0–34.0)
MCHC: 32.9 g/dL (ref 30.0–36.0)
MCV: 82.6 fL (ref 80.0–100.0)
Platelets: 317 10*3/uL (ref 150–400)
RBC: 2.98 MIL/uL — ABNORMAL LOW (ref 4.22–5.81)
RDW: 19.8 % — ABNORMAL HIGH (ref 11.5–15.5)
WBC: 20.6 10*3/uL — ABNORMAL HIGH (ref 4.0–10.5)
nRBC: 0.4 % — ABNORMAL HIGH (ref 0.0–0.2)

## 2023-10-29 LAB — BASIC METABOLIC PANEL
Anion gap: 11 (ref 5–15)
BUN: 23 mg/dL — ABNORMAL HIGH (ref 6–20)
CO2: 20 mmol/L — ABNORMAL LOW (ref 22–32)
Calcium: 8 mg/dL — ABNORMAL LOW (ref 8.9–10.3)
Chloride: 98 mmol/L (ref 98–111)
Creatinine, Ser: 1.63 mg/dL — ABNORMAL HIGH (ref 0.61–1.24)
GFR, Estimated: 49 mL/min — ABNORMAL LOW (ref >60)
Glucose, Bld: 161 mg/dL — ABNORMAL HIGH (ref 70–99)
Potassium: 3.1 mmol/L — ABNORMAL LOW (ref 3.5–5.1)
Sodium: 129 mmol/L — ABNORMAL LOW (ref 135–145)

## 2023-10-29 LAB — TRIGLYCERIDES: Triglycerides: 537 mg/dL — ABNORMAL HIGH (ref <150)

## 2023-10-29 LAB — PHOSPHORUS: Phosphorus: 2 mg/dL — ABNORMAL LOW (ref 2.5–4.6)

## 2023-10-29 LAB — CULTURE, BLOOD (ROUTINE X 2): Culture: NO GROWTH

## 2023-10-29 LAB — MAGNESIUM: Magnesium: 2.4 mg/dL (ref 1.7–2.4)

## 2023-10-29 MED ORDER — FENTANYL BOLUS VIA INFUSION
12.5000 ug | INTRAVENOUS | Status: DC | PRN
Start: 2023-10-29 — End: 2023-10-31
  Administered 2023-10-30 (×2): 50 ug via INTRAVENOUS

## 2023-10-29 MED ORDER — TRACE MINERALS CU-MN-SE-ZN 300-55-60-3000 MCG/ML IV SOLN: INTRAVENOUS | Status: DC

## 2023-10-29 MED ORDER — FUROSEMIDE 10 MG/ML IJ SOLN
40.0000 mg | Freq: Once | INTRAMUSCULAR | Status: AC
  Administered 2023-10-29: 40 mg via INTRAVENOUS
  Filled 2023-10-29: qty 4

## 2023-10-29 MED ORDER — DEXMEDETOMIDINE HCL IN NACL 400 MCG/100ML IV SOLN
0.0000 ug/kg/h | INTRAVENOUS | Status: DC
  Administered 2023-10-29: 0.3 ug/kg/h via INTRAVENOUS
  Administered 2023-10-29: 0.4 ug/kg/h via INTRAVENOUS
  Administered 2023-10-30: 0.3 ug/kg/h via INTRAVENOUS
  Administered 2023-10-30: 0.2 ug/kg/h via INTRAVENOUS
  Filled 2023-10-29 (×6): qty 100

## 2023-10-29 MED ORDER — POTASSIUM PHOSPHATES 15 MMOLE/5ML IV SOLN
45.0000 mmol | Freq: Once | INTRAVENOUS | Status: AC
  Administered 2023-10-29: 45 mmol via INTRAVENOUS
  Filled 2023-10-29: qty 15

## 2023-10-29 MED ORDER — TRACE MINERALS CU-MN-SE-ZN 300-55-60-3000 MCG/ML IV SOLN
INTRAVENOUS | Status: AC
  Filled 2023-10-29: qty 608

## 2023-10-29 NOTE — Consult Note (Signed)
WOC team consulted for placement of NPWT.  Confirmed with surgical team ok to place Thursday and move forward with Mon/Thursday schedule. Per bedside RN supplies have been ordered.    WOC team will follow Thursday 10/30/2023 for NPWT and ostomy care.    Thank you,    Priscella Mann MSN, RN-BC, [employer] [phone]

## 2023-10-29 NOTE — Progress Notes (Signed)
PHARMACY - TOTAL PARENTERAL NUTRITION CONSULT NOTE   Indication: Prolonged ileus  Patient Measurements: Height: 6\' 1"  (185.4 cm) Weight: 118.9 kg (262 lb 2 oz) IBW/kg (Calculated) : 79.9 TPN AdjBW (KG): 86.6 Body mass index is 34.58 kg/m. Usual Weight: 120-130 kg   Assessment:  Patient admitted 2/14 with septic shock in the setting of bowel perforation. Patient has enterococcus bacteremia. TTE on 2/16 negative for vegetations. ID following. Due to prolonged ileus postoperatively, pharmacy consulted to start TPN on 2/18.   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   Glucose / Insulin: CBGs < 180. HbA1C 5.6% in 2020 (PTA empagliflozin for heart failure). hydrocortisone 100 mg q12h>50 daily.  6u SSI in past 24h Electrolytes: Na 129, K 3.1, Cl 98, CO2 20, Mg 2.4 phos 2.0 Renal: BUN 23, Scr 1.63 (baseline ~ 1.5)  Hepatic:  LFTs WNL, last albumin 1.6 (2/14); TG 537 Intake / Output; MIVF: 3.3L (1.2 mL/kg/hr) UOP in last 24 hours ; NGT output, LBM 2/18. Net +14L GI Imaging:  2/14 CT Ab/P - Substantial free intraperitoneal gas with ascites, exact site of perforation not well seen although colonic etiology favored given the diffuse colonic distension.  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; ABThera wound VAC application  2/17 exploratory laparotomy, end ileostomy, fascia closure   Central access: PICC 2/15  TPN start date: 2/18   Nutritional Goals: Goal TPN rate is 60 mL/hr (provides 137 g of protein and 2200 kcals per day) with current rate of propofol infusion  If propofol is discontinued, goal TPN rate will be 75 mL/hr with 75 g/L AA 15%, 20% dextrose,  and 30% lipid  RD Assessment: Estimated Needs Total Energy Estimated Needs: 2200-2400 Total Protein Estimated Needs: 130-150 gm Total Fluid Estimated Needs: 2.2-2.4 L  Current Nutrition:  NPO  Plan:  Advance TPN slightly to 40 mL/hr at 1800 - no lipids in TPN due to hypertriglyceridemia Electrolytes in TPN: Na 65 mEq/L, K 50 mEq/L, Ca 5 mEq/L, Mg 0 mEq/L, and Phos 27 mmol/L. Max acetate  Continue standard MVI and trace elements to TPN Continue Sensitive q4h SSI and adjust as needed  Monitor TPN labs on Mon/Thurs, daily BMP, Mag, Phos, Trigs in AM Kphos 45 mmol x1 Transitioning from propofol > precedex for sedation ; lasix 40mg  Iv x1 per primary   Calton Dach, PharmD, [facility] Clinical Pharmacist 10/29/2023 9:04 AM

## 2023-10-29 NOTE — Progress Notes (Signed)
NAME:  Daniel Butler, MRN:  [id number], DOB:  11/04/1965, LOS: 5 ADMISSION DATE:  10/24/2023, CONSULTATION DATE:  10/24/2023 REFERRING MD:  Dr. Bedelia Person - CCS, CHIEF COMPLAINT:  Bowel perf   History of Present Illness:  Daniel Butler is a 58 y.o. 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  States he has felt unwell x 3 days.  On ED arrival patient was seen febrile with temperature 103.4, tachypneic, and tachycardic.  Lab work significant for K2.4, glucose 116, creatinine 3.12, anion gap 26, alkaline phosphate 33, albumin 2.5, lactic 10.3, WBC 19.6.  CT abdomen and pelvis obtained which revealed substantial free air concerning for acute bowel perforation.  Surgery consulted and patient underwent exploratory laparotomy which revealed diffuse colonic distention with ischemia and gangrenous colonic necrosis with scattered areas of perforation and massive flocculent peritonitis.  Postprocedure patient remained intubated and sedated, PCCM consulted for further management admission.  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 OR for subtotal colectomy, intestinal discontinuity  2/15: con't on pressor support, ID consult  2/17: exploratory laparotomy, end ileostomy 2/18: goal to wake up and wean, will need TPN  2/19: waking, gets tachypneic, tachycardic with SBT  Interim History / Subjective:  TPN ongoing. NAEON. SBT 2/19 AM with tachycardia, tachypnea. Will transition to precedex and fentanyl, off propofol to maybe help with anxiety. Cannot give per tube meds like oral opioid or klonopin. Will reassess. Having output from colostomy. Very puffy and will diurese.   Objective   Blood pressure 123/77, pulse 82, temperature 99.1 F (37.3 C), resp. rate 14, height 6\' 1"  (1.854 m), weight 118.9 kg, SpO2 100%.    Vent Mode: PRVC FiO2 (%):  [30 %] 30 % Set Rate:  [14 bmp] 14 bmp Vt Set:  [600 mL] 600 mL PEEP:  [5 cmH20] 5 cmH20 Pressure Support:  [10 cmH20-12 cmH20] 10 cmH20 Plateau Pressure:  [14 cmH20-15 cmH20] 15 cmH20   Intake/Output Summary (Last 24 hours) at 10/29/2023 1129 Last data filed at 10/29/2023 1000 Gross per 24 hour  Intake 2255.18 ml  Output 4520 ml  Net -2264.82 ml   Filed Weights   10/25/23 0441 10/26/23 0500 10/27/23 0500  Weight: 119.3 kg 118.2 kg 118.9 kg    Physical Exam: General: acute on chronically ill appearing male, laying in bed, no acute distress  HENT: anicteric sclera, perrla, mmm, ett, ogt  Respiratory: vented, minimal settings, clear bilaterally  Cardiovascular: irregularly irregular rhythm, no appreciable murmur GI: soft, midline laparotomy dressing cdi, ostomy is pink  and beefy  Extremities: anasarca  Neuro: lightly sedated, he attempts to follow commands GU:  foley  Bcx 10/24/23 GPC with BCID Enteroccus faecalis  Resolved Hospital Problem list    Assessment & Plan:  Septic shock in the setting of small bowel perforation s/p ex lap subtotal colectomy and ileostomy placement Enterococcus fecalis bacteremia  - ID following, appreciate recs  - Surgery following, appreciate post-op management  - con't zosyn, micafungin  - stress dose steroids lowered to 50mg  daily  - wean levophed to MAP goal >65. Suspect still on low dose due to sedation - con't lovenox ppx - needs TEE when stable - strict NPO   Atrial fibrillation likely 2/2 sepsis, critical illness  - con't amio gtt  - tele monitoring  - will not anticoagulate yet, hope to resolve with resolution of critical illness   Postoperative ventilator management - wean sedation; switch to precedex/fentanyl 2/19. Off propofol. Working on anxiety/tachypnea/tachy r/t sbt  - failed 2/19 AM SBT, will try in afternoon  - full mechanical vent support - lung protective ventilation 6-8cc/kg Vt - VAP and PAD bundle in place  - titrate FiO2 to sat goal >92  - maintain peak/plats <30, driving pressures <78    Acute kidney injury on chronic kidney disease stage IIIa, improving - lasix 40mg  IV today for FVO - trend bmp, mag, phos - replete elytes - strict I&O - Avoid nephrotoxic agents, renally dose medications - ensure adequate renal perfusion   Chronic diastolic heart failure Repeat echo 2/16 EF 50-55%, no vegetation  -Home anticoagulation-Eliquis on hold- start per surgery   Anemia of chronic disease -Continue to monitor  High risk for protein calorie malnutrition  - con't TPN - strict NPO   Best Practice (right click and "Reselect all SmartList Selections" daily)   Diet/type: NPO and TPN DVT prophylaxis LMWH Pressure ulcer(s): N/A GI prophylaxis: PPI Lines: Central line and yes and it is still needed Foley:  Yes, and it is still needed Code Status:  full code Last date of  multidisciplinary goals of care discussion: updated wife at bedside 2/19  CC time: 35  Cristopher Peru, PA-C [facility] 10/29/23 11:29 AM  Please see [email or web address] for pager details.  From 7A-7P if no response, please call [phone] After hours, please call ELink [phone]

## 2023-10-29 NOTE — Progress Notes (Signed)
2 Days Post-Op   Subjective/Chief Complaint: No acute changes Remains intubated   Objective: Vital signs in last 24 hours: Temp:  [97 F (36.1 C)-98.6 F (37 C)] 98.2 F (36.8 C) (02/19 0800) Pulse Rate:  [63-124] 104 (02/19 0800) Resp:  [10-27] 18 (02/19 0800) BP: (100-132)/(73-91) 121/91 (02/19 0600) SpO2:  [93 %-100 %] 100 % (02/19 0800) Arterial Line BP: (97-137)/(49-81) 119/81 (02/19 0600) FiO2 (%):  [30 %] 30 % (02/19 0800) Last BM Date : 10/28/23  Intake/Output from previous day: 02/18 0701 - 02/19 0700 In: 2175.9 [I.V.:1618.1; IV Piggyback:557.8] Out: 3895 [Urine:3345; Emesis/NG output:550] Intake/Output this shift: No intake/output data recorded.  Exam: Intubated and sedated Abdomen soft, wound clean, ileostomy viable with some output  Lab Results:  Recent Labs    10/28/23 0552 10/29/23 0450  WBC 19.8* 20.6*  HGB 8.8* 8.1*  HCT 25.4* 24.6*  PLT 337 317   BMET Recent Labs    10/28/23 0552 10/29/23 0450  NA 124* 129*  K 3.1* 3.1*  CL 93* 98  CO2 18* 20*  GLUCOSE 143* 161*  BUN 25* 23*  CREATININE 1.44* 1.63*  CALCIUM 7.6* 8.0*   PT/INR No results for input(s): "LABPROT", "INR" in the last 72 hours. ABG Recent Labs    10/26/23 1036 10/27/23 0401  PHART 7.483* 7.499*  HCO3 18.7* 19.0*    Studies/Results: No results found.  Anti-infectives: Anti-infectives (From admission, onward)    Start     Dose/Rate Route Frequency Ordered Stop   10/25/23 1400  piperacillin-tazobactam (ZOSYN) IVPB 3.375 g        3.375 g 12.5 mL/hr over 240 Minutes Intravenous Every 8 hours 10/25/23 0942     10/25/23 0000  ceFEPIme (MAXIPIME) 2 g in sodium chloride 0.9 % 100 mL IVPB  Status:  Discontinued        2 g 200 mL/hr over 30 Minutes Intravenous Every 12 hours 10/24/23 1853 10/25/23 0942   10/24/23 2300  piperacillin-tazobactam (ZOSYN) IVPB 3.375 g  Status:  Discontinued        3.375 g 12.5 mL/hr over 240 Minutes Intravenous Every 8 hours 10/24/23 1509  10/24/23 1853   10/24/23 2300  metroNIDAZOLE (FLAGYL) IVPB 500 mg  Status:  Discontinued        500 mg 100 mL/hr over 60 Minutes Intravenous Every 12 hours 10/24/23 1838 10/25/23 0942   10/24/23 2000  micafungin (MYCAMINE) 100 mg in sodium chloride 0.9 % 100 mL IVPB        100 mg 105 mL/hr over 1 Hours Intravenous Every 24 hours 10/24/23 1838     10/24/23 1945  vancomycin (VANCOREADY) IVPB 2000 mg/400 mL        2,000 mg 200 mL/hr over 120 Minutes Intravenous NOW 10/24/23 1854 10/24/23 2239   10/24/23 1854  vancomycin variable dose per unstable renal function (pharmacist dosing)  Status:  Discontinued         Does not apply See admin instructions 10/24/23 1854 10/25/23 0942   10/24/23 1245  ceFEPIme (MAXIPIME) 2 g in sodium chloride 0.9 % 100 mL IVPB        2 g 200 mL/hr over 30 Minutes Intravenous  Once 10/24/23 1236 10/24/23 1334   10/24/23 1245  metroNIDAZOLE (FLAGYL) IVPB 500 mg        500 mg 100 mL/hr over 60 Minutes Intravenous  Once 10/24/23 1236 10/24/23 1453       Assessment/Plan: POD 2/4 s/p ex lap with subtotal abdominal colectomy in discontinuity, Dr. Bedelia Person 2/15,  re-exploration with creation of end ileostomy and closure, Dr. Magnus Ivan 2/17 for pneumoperitoneum and gangrene of colon   -continue NPO -may start trickle tube feeds in next 24 hours if ostomy output increases -continue wound care. Ok to place wound VAC  Abigail Miyamoto 10/29/2023

## 2023-10-29 NOTE — Progress Notes (Signed)
[facility]  Date of Admission:  10/24/2023      Total days of antibiotics 5   Cefepime   Zosyn 2/15 >> c             ASSESSMENT: Daniel Butler is a 58 y.o. male admitted with   E Faecalis Bacteremia -  Colonic Perforation -  Fevers have resolved. On piperacillin tazobactam + micafungin for bowel source.  TTE looks normal - will defer TEE for now given critical illness and alternative source.  BCx negative prelim 2/15 -Needs line holiday when off pressors   Colonic Perforation - Back to OR 2/17 for end ileostomy - bowel pink and viable. No necrosis noted but distention in sigmoid colon.  -Surgery team following   Sepsis -  Multiorgan Dysfunction -  On 2 mcg NE now - probably in part d/t sedation. Now on precedex. Creatinine 1.63, little up from yesterday.    PLAN: Can stop micafungin with no signs of candidemia on cultures.  Continue zosyn - suspect 1-2 weeks from last surgery to cover for intraabdominal perf.  Needs central line out when he is stable to afford that (was placed in bacteremia)     Principal Problem:   S/P exploratory laparotomy Active Problems:   Septic shock (HCC)    Chlorhexidine Gluconate Cloth  6 each Topical Q0600   enoxaparin (LOVENOX) injection  40 mg Subcutaneous Daily   hydrocortisone sod succinate (SOLU-CORTEF) inj  50 mg Intravenous Daily    HYDROmorphone (DILAUDID) injection  1 mg Intravenous Once   insulin aspart  0-9 Units Subcutaneous Q4H   mupirocin ointment  1 Application Nasal BID   mouth rinse  15 mL Mouth Rinse Q2H   pantoprazole (PROTONIX) IV  40 mg Intravenous QHS   sodium chloride flush  10-40 mL Intracatheter Q12H    SUBJECTIVE: Intubated, Wife [name] at the bedside.  Weaning on ventilator now   Review of Systems: Review of Systems  Unable to perform ROS: Intubated    Allergies  Allergen Reactions   Zetia [Ezetimibe] Nausea And Vomiting    OBJECTIVE: Vitals:    10/29/23 1015 10/29/23 1122 10/29/23 1410 10/29/23 1602  BP:      Pulse: 95 82    Resp: 15 14    Temp: 99 F (37.2 C) 99.1 F (37.3 C)    TempSrc:      SpO2: 100% 100% 100% 100%  Weight:      Height:       Body mass index is 34.58 kg/m.   Physical Exam Vitals reviewed.  Constitutional:      Comments: Gaze slowly tracking to voice.   Cardiovascular:     Rate and Rhythm: Normal rate and regular rhythm.  Abdominal:     Tenderness: There is abdominal tenderness.  Skin:    General: Skin is warm and dry.     Capillary Refill: Capillary refill takes less than 2 seconds.  Neurological:     Mental Status: He is alert and oriented to person, place, and time.      Lab Results Lab Results  Component Value Date   WBC 20.6 (H) 10/29/2023   HGB 8.1 (L) 10/29/2023   HCT 24.6 (L) 10/29/2023   MCV 82.6 10/29/2023   PLT 317 10/29/2023    Lab Results  Component Value Date   CREATININE 1.63 (H) 10/29/2023   BUN 23 (H) 10/29/2023   NA 129 (L) 10/29/2023  K 3.1 (L) 10/29/2023   CL 98 10/29/2023   CO2 20 (L) 10/29/2023    Lab Results  Component Value Date   ALT 7 10/24/2023   AST 44 (H) 10/24/2023   ALKPHOS 23 (L) 10/24/2023   BILITOT 0.8 10/24/2023     Microbiology: Recent Results (from the past 240 hours)  Blood Culture (routine x 2)     Status: Abnormal   Collection Time: 10/24/23 12:35 PM   Specimen: BLOOD RIGHT ARM  Result Value Ref Range Status   Specimen Description BLOOD RIGHT ARM  Final   Special Requests   Final    BOTTLES DRAWN AEROBIC AND ANAEROBIC Blood Culture results may not be optimal due to an inadequate volume of blood received in culture bottles   Culture  Setup Time   Final    GRAM POSITIVE COCCI AEROBIC BOTTLE ONLY CRITICAL RESULT CALLED TO, READ BACK BY AND VERIFIED WITH: PHARMD KENDALL H. [phone] FCP Performed at [facility], [address] [place], [place] [address]    Culture ENTEROCOCCUS FAECALIS (A)  Final   Report Status  10/27/2023 FINAL  Final   Organism ID, Bacteria ENTEROCOCCUS FAECALIS  Final      Susceptibility   Enterococcus faecalis - MIC*    AMPICILLIN <=2 SENSITIVE Sensitive     VANCOMYCIN 1 SENSITIVE Sensitive     GENTAMICIN SYNERGY SENSITIVE Sensitive     * ENTEROCOCCUS FAECALIS  Blood Culture ID Panel (Reflexed)     Status: Abnormal   Collection Time: 10/24/23 12:35 PM  Result Value Ref Range Status   Enterococcus faecalis DETECTED (A) NOT DETECTED Final    Comment: CRITICAL RESULT CALLED TO, READ BACK BY AND VERIFIED WITH: PHARMD KENDALL H. [phone] FCP    Enterococcus Faecium NOT DETECTED NOT DETECTED Final   Listeria monocytogenes NOT DETECTED NOT DETECTED Final   Staphylococcus species NOT DETECTED NOT DETECTED Final   Staphylococcus aureus (BCID) NOT DETECTED NOT DETECTED Final   Staphylococcus epidermidis NOT DETECTED NOT DETECTED Final   Staphylococcus lugdunensis NOT DETECTED NOT DETECTED Final   Streptococcus species NOT DETECTED NOT DETECTED Final   Streptococcus agalactiae NOT DETECTED NOT DETECTED Final   Streptococcus pneumoniae NOT DETECTED NOT DETECTED Final   Streptococcus pyogenes NOT DETECTED NOT DETECTED Final   A.calcoaceticus-baumannii NOT DETECTED NOT DETECTED Final   Bacteroides fragilis NOT DETECTED NOT DETECTED Final   Enterobacterales NOT DETECTED NOT DETECTED Final   Enterobacter cloacae complex NOT DETECTED NOT DETECTED Final   Escherichia coli NOT DETECTED NOT DETECTED Final   Klebsiella aerogenes NOT DETECTED NOT DETECTED Final   Klebsiella oxytoca NOT DETECTED NOT DETECTED Final   Klebsiella pneumoniae NOT DETECTED NOT DETECTED Final   Proteus species NOT DETECTED NOT DETECTED Final   Salmonella species NOT DETECTED NOT DETECTED Final   Serratia marcescens NOT DETECTED NOT DETECTED Final   Haemophilus influenzae NOT DETECTED NOT DETECTED Final   Neisseria meningitidis NOT DETECTED NOT DETECTED Final   Pseudomonas aeruginosa NOT DETECTED NOT DETECTED  Final   Stenotrophomonas maltophilia NOT DETECTED NOT DETECTED Final   Candida albicans NOT DETECTED NOT DETECTED Final   Candida auris NOT DETECTED NOT DETECTED Final   Candida glabrata NOT DETECTED NOT DETECTED Final   Candida krusei NOT DETECTED NOT DETECTED Final   Candida parapsilosis NOT DETECTED NOT DETECTED Final   Candida tropicalis NOT DETECTED NOT DETECTED Final   Cryptococcus neoformans/gattii NOT DETECTED NOT DETECTED Final   Vancomycin resistance NOT DETECTED NOT DETECTED Final  Comment: Performed at [facility], [address], [place], [place] [address]  Blood Culture (routine x 2)     Status: None   Collection Time: 10/24/23 12:40 PM   Specimen: BLOOD RIGHT HAND  Result Value Ref Range Status   Specimen Description BLOOD RIGHT HAND  Final   Special Requests   Final    AEROBIC BOTTLE ONLY Blood Culture results may not be optimal due to an inadequate volume of blood received in culture bottles   Culture   Final    NO GROWTH 5 DAYS Performed at [facility], [address] [place], [place] [address]    Report Status 10/29/2023 FINAL  Final  Resp panel by RT-PCR (RSV, Flu A&B, Covid) Anterior Nasal Swab     Status: None   Collection Time: 10/24/23 12:46 PM   Specimen: Anterior Nasal Swab  Result Value Ref Range Status   SARS Coronavirus 2 by RT PCR NEGATIVE NEGATIVE Final   Influenza A by PCR NEGATIVE NEGATIVE Final   Influenza B by PCR NEGATIVE NEGATIVE Final    Comment: (NOTE) The Xpert Xpress SARS-CoV-2/FLU/RSV plus assay is intended as an aid in the diagnosis of influenza from Nasopharyngeal swab specimens and should not be used as a sole basis for treatment. Nasal washings and aspirates are unacceptable for Xpert Xpress SARS-CoV-2/FLU/RSV testing.  Fact Sheet for Patients: BloggerCourse.com  Fact Sheet for Healthcare Providers: SeriousBroker.it  This test is not yet approved or cleared by the  [place] FDA and has been authorized for detection and/or diagnosis of SARS-CoV-2 by FDA under an Emergency Use Authorization (EUA). This EUA will remain in effect (meaning this test can be used) for the duration of the COVID-19 declaration under Section 564(b)(1) of the Act, 21 U.S.C. section 360bbb-3(b)(1), unless the authorization is terminated or revoked.     Resp Syncytial Virus by PCR NEGATIVE NEGATIVE Final    Comment: (NOTE) Fact Sheet for Patients: BloggerCourse.com  Fact Sheet for Healthcare Providers: SeriousBroker.it  This test is not yet approved or cleared by the [place] FDA and has been authorized for detection and/or diagnosis of SARS-CoV-2 by FDA under an Emergency Use Authorization (EUA). This EUA will remain in effect (meaning this test can be used) for the duration of the COVID-19 declaration under Section 564(b)(1) of the Act, 21 U.S.C. section 360bbb-3(b)(1), unless the authorization is terminated or revoked.  Performed at [facility], [address], [place], [place] [address]   MRSA Next Gen by PCR, Nasal     Status: Abnormal   Collection Time: 10/24/23  8:21 PM   Specimen: Nasal Mucosa; Nasal Swab  Result Value Ref Range Status   MRSA by PCR Next Gen DETECTED (A) NOT DETECTED Final    Comment: RESULT CALLED TO, READ BACK BY AND VERIFIED WITH: D HAGGERTY,RN@0007  10/25/23 MK (NOTE) The GeneXpert MRSA Assay (FDA approved for NASAL specimens only), is one component of a comprehensive MRSA colonization surveillance program. It is not intended to diagnose MRSA infection nor to guide or monitor treatment for MRSA infections. Test performance is not FDA approved in patients less than 75 years old. Performed at [facility], [address], [place], [place] [address]   Culture, blood (Routine X 2) w Reflex to ID Panel     Status: None (Preliminary result)   Collection Time: 10/25/23  10:57 AM   Specimen: BLOOD RIGHT ARM  Result Value Ref Range Status   Specimen Description BLOOD RIGHT  ARM  Final   Special Requests   Final    BOTTLES DRAWN AEROBIC AND ANAEROBIC Blood Culture results may not be optimal due to an inadequate volume of blood received in culture bottles   Culture   Final    NO GROWTH 4 DAYS Performed at [facility], [address] [place], [place] [address]    Report Status PENDING  Incomplete  Culture, blood (Routine X 2) w Reflex to ID Panel     Status: None (Preliminary result)   Collection Time: 10/25/23 10:57 AM   Specimen: BLOOD RIGHT HAND  Result Value Ref Range Status   Specimen Description BLOOD RIGHT HAND  Final   Special Requests   Final    BOTTLES DRAWN AEROBIC AND ANAEROBIC Blood Culture results may not be optimal due to an inadequate volume of blood received in culture bottles   Culture   Final    NO GROWTH 4 DAYS Performed at [facility], [address], [place], [place] [address]    Report Status PENDING  Incomplete  Surgical pcr screen     Status: Abnormal   Collection Time: 10/27/23  5:08 AM   Specimen: Nasal Mucosa; Nasal Swab  Result Value Ref Range Status   MRSA, PCR POSITIVE (A) NEGATIVE Final    Comment: RESULT CALLED TO, READ BACK BY AND VERIFIED WITH: T GUY,RN@[id number]  10/27/23 MK    Staphylococcus aureus POSITIVE (A) NEGATIVE Final    Comment: (NOTE) The Xpert SA Assay (FDA approved for NASAL specimens in patients 62 years of age and older), is one component of a comprehensive surveillance program. It is not intended to diagnose infection nor to guide or monitor treatment. Performed at [facility], [address] [place], [place] [address]     Rexene Alberts, MSN, NP-C [facility]  [email or web address] Pager: [phone] Office: [phone] [facility] Main Line: [phone] *Secure Chat Communication Welcome

## 2023-10-30 DIAGNOSIS — R6521 Severe sepsis with septic shock: Secondary | ICD-10-CM | POA: Diagnosis not present

## 2023-10-30 DIAGNOSIS — K650 Generalized (acute) peritonitis: Secondary | ICD-10-CM | POA: Diagnosis not present

## 2023-10-30 DIAGNOSIS — R7881 Bacteremia: Secondary | ICD-10-CM | POA: Diagnosis not present

## 2023-10-30 DIAGNOSIS — A4181 Sepsis due to Enterococcus: Secondary | ICD-10-CM | POA: Diagnosis not present

## 2023-10-30 DIAGNOSIS — K55069 Acute infarction of intestine, part and extent unspecified: Secondary | ICD-10-CM

## 2023-10-30 DIAGNOSIS — B952 Enterococcus as the cause of diseases classified elsewhere: Secondary | ICD-10-CM | POA: Diagnosis not present

## 2023-10-30 DIAGNOSIS — Z9889 Other specified postprocedural states: Secondary | ICD-10-CM | POA: Diagnosis not present

## 2023-10-30 LAB — GLUCOSE, CAPILLARY
Glucose-Capillary: 117 mg/dL — ABNORMAL HIGH (ref 70–99)
Glucose-Capillary: 125 mg/dL — ABNORMAL HIGH (ref 70–99)
Glucose-Capillary: 135 mg/dL — ABNORMAL HIGH (ref 70–99)
Glucose-Capillary: 136 mg/dL — ABNORMAL HIGH (ref 70–99)
Glucose-Capillary: 136 mg/dL — ABNORMAL HIGH (ref 70–99)
Glucose-Capillary: 139 mg/dL — ABNORMAL HIGH (ref 70–99)

## 2023-10-30 LAB — TRIGLYCERIDES: Triglycerides: 117 mg/dL (ref <150)

## 2023-10-30 LAB — CBC
HCT: 25 % — ABNORMAL LOW (ref 39.0–52.0)
Hemoglobin: 8.1 g/dL — ABNORMAL LOW (ref 13.0–17.0)
MCH: 26.5 pg (ref 26.0–34.0)
MCHC: 32.4 g/dL (ref 30.0–36.0)
MCV: 81.7 fL (ref 80.0–100.0)
Platelets: 243 10*3/uL (ref 150–400)
RBC: 3.06 MIL/uL — ABNORMAL LOW (ref 4.22–5.81)
RDW: 20.3 % — ABNORMAL HIGH (ref 11.5–15.5)
WBC: 15.7 10*3/uL — ABNORMAL HIGH (ref 4.0–10.5)
nRBC: 1 % — ABNORMAL HIGH (ref 0.0–0.2)

## 2023-10-30 LAB — COMPREHENSIVE METABOLIC PANEL
ALT: 14 U/L (ref 0–44)
AST: 23 U/L (ref 15–41)
Albumin: <1.5 g/dL — ABNORMAL LOW (ref 3.5–5.0)
Alkaline Phosphatase: 46 U/L (ref 38–126)
Anion gap: 11 (ref 5–15)
BUN: 26 mg/dL — ABNORMAL HIGH (ref 6–20)
CO2: 27 mmol/L (ref 22–32)
Calcium: 7.9 mg/dL — ABNORMAL LOW (ref 8.9–10.3)
Chloride: 103 mmol/L (ref 98–111)
Creatinine, Ser: 1.65 mg/dL — ABNORMAL HIGH (ref 0.61–1.24)
GFR, Estimated: 48 mL/min — ABNORMAL LOW (ref >60)
Glucose, Bld: 122 mg/dL — ABNORMAL HIGH (ref 70–99)
Potassium: 2.9 mmol/L — ABNORMAL LOW (ref 3.5–5.1)
Sodium: 141 mmol/L (ref 135–145)
Total Bilirubin: 1.1 mg/dL (ref 0.0–1.2)
Total Protein: 4.5 g/dL — ABNORMAL LOW (ref 6.5–8.1)

## 2023-10-30 LAB — CULTURE, BLOOD (ROUTINE X 2)
Culture: NO GROWTH
Culture: NO GROWTH

## 2023-10-30 LAB — PHOSPHORUS: Phosphorus: 4 mg/dL (ref 2.5–4.6)

## 2023-10-30 LAB — MAGNESIUM: Magnesium: 2 mg/dL (ref 1.7–2.4)

## 2023-10-30 MED ORDER — VITAL 1.5 CAL PO LIQD
1000.0000 mL | ORAL | Status: DC
  Administered 2023-10-30: 1000 mL

## 2023-10-30 MED ORDER — GABAPENTIN 250 MG/5ML PO SOLN
100.0000 mg | Freq: Three times a day (TID) | ORAL | Status: DC
  Administered 2023-10-30 – 2023-11-09 (×27): 100 mg
  Filled 2023-10-30 (×31): qty 2

## 2023-10-30 MED ORDER — FUROSEMIDE 10 MG/ML IJ SOLN
40.0000 mg | Freq: Two times a day (BID) | INTRAMUSCULAR | Status: AC
Start: 2023-10-30 — End: 2023-10-30
  Administered 2023-10-30 (×2): 40 mg via INTRAVENOUS
  Filled 2023-10-30 (×2): qty 4

## 2023-10-30 MED ORDER — VITAL HIGH PROTEIN PO LIQD
1000.0000 mL | ORAL | Status: DC
Start: 2023-10-30 — End: 2023-10-30

## 2023-10-30 MED ORDER — POTASSIUM CHLORIDE 10 MEQ/50ML IV SOLN
10.0000 meq | INTRAVENOUS | Status: AC
  Administered 2023-10-30 (×5): 10 meq via INTRAVENOUS
  Filled 2023-10-30 (×5): qty 50

## 2023-10-30 MED ORDER — POTASSIUM CHLORIDE 20 MEQ PO PACK
20.0000 meq | PACK | Freq: Once | ORAL | Status: AC
  Administered 2023-10-30: 20 meq
  Filled 2023-10-30: qty 1

## 2023-10-30 MED ORDER — TRAVASOL 10 % IV SOLN
INTRAVENOUS | Status: AC
Start: 2023-10-30 — End: 2023-10-31
  Filled 2023-10-30: qty 974.4

## 2023-10-30 MED ORDER — CLONAZEPAM 0.5 MG PO TABS
0.5000 mg | ORAL_TABLET | Freq: Two times a day (BID) | ORAL | Status: DC
Start: 2023-10-30 — End: 2023-11-01
  Administered 2023-10-30 – 2023-10-31 (×4): 0.5 mg
  Filled 2023-10-30 (×4): qty 1

## 2023-10-30 MED ORDER — METOPROLOL TARTRATE 5 MG/5ML IV SOLN
5.0000 mg | Freq: Once | INTRAVENOUS | Status: DC
  Filled 2023-10-30: qty 5

## 2023-10-30 MED ORDER — OXYCODONE HCL 5 MG PO TABS
5.0000 mg | ORAL_TABLET | Freq: Four times a day (QID) | ORAL | Status: DC
  Administered 2023-10-30 – 2023-11-01 (×8): 5 mg
  Filled 2023-10-30 (×8): qty 1

## 2023-10-30 NOTE — Progress Notes (Signed)
PHARMACY - TOTAL PARENTERAL NUTRITION CONSULT NOTE   Indication: Prolonged ileus  Patient Measurements: Height: 6\' 1"  (185.4 cm) Weight: 118.9 kg (262 lb 2 oz) IBW/kg (Calculated) : 79.9 TPN AdjBW (KG): 86.6 Body mass index is 34.58 kg/m. Usual Weight: 120-130 kg   Assessment:  Patient admitted 2/14 with septic shock in the setting of bowel perforation. Patient has enterococcus bacteremia. TTE on 2/16 negative for vegetations. ID following. Due to prolonged ileus postoperatively, pharmacy consulted to start TPN on 2/18.   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   Glucose / Insulin: CBGs < 180. HbA1C 5.6% in 2020 (PTA empagliflozin for heart failure). hydrocortisone 100 mg q12h>50 daily.  10u SSI in past 24h Electrolytes: Na 141, K 2.9, Cl 107, CO2 27, Mg 2.0 phos 4.0 Renal: BUN 26, Scr 1.65 (baseline ~ 1.5)  Hepatic:  LFTs WNL, albumin<1.5;  TG 117 Intake / Output; MIVF: 4.1L (1.4 mL/kg/hr) UOP in last 24 hours ; 300 mL NGT output, LBM 2/19. Net +12.3L GI Imaging:  2/14 CT Ab/P - Substantial free intraperitoneal gas with ascites, exact site of perforation not well seen although colonic etiology favored given the diffuse colonic distension.  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 Cl: Ace 1: 1 Continue standard MVI and trace elements to TPN Continue Sensitive q4h SSI and adjust as needed  Monitor TPN labs on Mon/Thurs, daily BMP, Mag, Phos, Trigs in AM Kcl 10 Meq IV x 5 this AM ; Kcl packet x1  Lasix 40mg  x2 today per primary   Calton Dach, PharmD, BCCCP Clinical Pharmacist 10/30/2023 7:48 AM

## 2023-10-30 NOTE — Progress Notes (Signed)
[facility]    Date of Admission:  10/24/2023   Total days of antibiotics 6          ID: Daniel Butler is a 58 y.o. male with  s/p ex lap with subtotal abdominal colectomy in discontinuity, Dr. 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 Magnus Ivan 2/17 for pneumoperitoneum and gangrene of colon . Complicated with enterococcal bacteremia Principal Problem:   S/P exploratory laparotomy Active Problems:   Septic shock (HCC)    Subjective: Afebrile, still on pressors, and ventilator. Started on trickle feeds but continues on TPN in the meantime  Medications:   Chlorhexidine Gluconate Cloth  6 each Topical Q0600   clonazePAM  0.5 mg Per Tube BID   enoxaparin (LOVENOX) injection  40 mg Subcutaneous Daily   feeding supplement (VITAL HIGH PROTEIN)  1,000 mL Per Tube Q24H   furosemide  40 mg Intravenous Q12H   gabapentin  100 mg Per Tube Q8H   insulin aspart  0-9 Units Subcutaneous Q4H   metoprolol tartrate  5 mg Intravenous Once   mupirocin ointment  1 Application Nasal BID   mouth rinse  15 mL Mouth Rinse Q2H   oxyCODONE  5 mg Per Tube Q6H   pantoprazole (PROTONIX) IV  40 mg Intravenous QHS   sodium chloride flush  10-40 mL Intracatheter Q12H    Objective: Vital signs in last 24 hours: Temp:  [97 F (36.1 C)-99.3 F (37.4 C)] 99 F (37.2 C) (02/20 1000) Pulse Rate:  [50-121] 101 (02/20 1000) Resp:  [11-32] 17 (02/20 1152) BP: (77-150)/(55-110) 88/63 (02/20 1000) SpO2:  [88 %-100 %] 100 % (02/20 1000) FiO2 (%):  [30 %] 30 % (02/20 1152) Physical Exam  Constitutional: sedated. He appears well-developed and well-nourished. No distress.  HENT:  Mouth/Throat: OETT. Cardiovascular: Normal rate, regular rhythm and normal heart sounds. Exam reveals no gallop and no friction rub.  No murmur heard.  Pulmonary/Chest: Effort normal and breath sounds normal. No respiratory distress. He has no wheezes.  Abdominal: Soft.  Bowel sounds are normal. He exhibits no distension.RLQ ilostomy and midly surgical incision is covered. Lymphadenopathy:  He has no cervical adenopathy.  Neurological: He is alert and oriented to person, place, and time.  Skin: Skin is warm and dry. No rash noted. No erythema.  Psychiatric: He has a normal mood and affect. His behavior is normal.    Lab Results Recent Labs    10/29/23 0450 10/30/23 0444 10/30/23 0755  WBC 20.6*  --  15.7*  HGB 8.1*  --  8.1*  HCT 24.6*  --  25.0*  NA 129* 141  --   K 3.1* 2.9*  --   CL 98 103  --   CO2 20* 27  --   BUN 23* 26*  --   CREATININE 1.63* 1.65*  --    Liver Panel Recent Labs    10/30/23 0444  PROT 4.5*  ALBUMIN <1.5*  AST 23  ALT 14  ALKPHOS 46  BILITOT 1.1   Sedimentation Rate No results for input(s): "ESRSEDRATE" in the last 72 hours. C-Reactive Protein No results for input(s): "CRP" in the last 72 hours.  Microbiology: 2.14 blood cx enterococcus 2/15 blood cx NGTD Studies/Results: No results found.   Assessment/Plan:  57yo M s/p ex lap with subtotal abdominal colectomy in discontinuity, Dr. 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 Magnus Ivan 2/17 for pneumoperitoneum and gangrene of colon and secondary  enterococcal bacteremia = continue on piptazo for the timebeing plan for 14 days using 2/15 as day 1. At later date When more clinically stable please exchange his line access since it is considered seeded  Respiratory distress on vent = deferred to primary team for management  Leukocytosis = trending downward  [facility] for Infect[facility] Diseases Pager: [phone]  10/30/2023, 2:31 PM

## 2023-10-30 NOTE — Progress Notes (Signed)
NAME:  Daniel Butler, MRN:  [id number], DOB:  Apr 29, 1966, LOS: 6 ADMISSION DATE:  10/24/2023, CONSULTATION DATE:  10/24/2023 REFERRING MD:  Dr. Bedelia Person - CCS, CHIEF COMPLAINT:  Bowel perf   History of Present Illness:  Daniel Butler is a 58 y.o. 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  States he has felt unwell x 3 days.  On ED arrival patient was seen febrile with temperature 103.4, tachypneic, and tachycardic.  Lab work significant for K2.4, glucose 116, creatinine 3.12, anion gap 26, alkaline phosphate 33, albumin 2.5, lactic 10.3, WBC 19.6.  CT abdomen and pelvis obtained which revealed substantial free air concerning for acute bowel perforation.  Surgery consulted and patient underwent exploratory laparotomy which revealed diffuse colonic distention with ischemia and gangrenous colonic necrosis with scattered areas of perforation and massive flocculent peritonitis.  Postprocedure patient remained intubated and sedated, PCCM consulted for further management admission.  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 OR for subtotal colectomy, intestinal discontinuity  2/15: con't on pressor support, ID consult  2/17: exploratory laparotomy, end ileostomy 2/18: goal to wake up and wean, will need TPN  2/19: waking, gets tachypneic, tachycardic with SBT 2/20: failed SBT. Starting trickle feeds, lasix BID, PO oxy and klonopin   Interim History / Subjective:  Trickle feeds today, po oxycodone and klonopin, lasix 40 IV BID to optimize fluid status, tomorrow maybe about amio switch to oral. Failed SBT 2/20 AM for increased WOB. Globally very weak, deconditioned, abdominal surgery with weakening of abdominal wall.   Objective   Blood pressure (!) 88/63, pulse (!) 101, temperature 99 F (37.2 C), resp. rate 18, height 6\' 1"  (1.854 m), weight 118.9 kg, SpO2 100%.    Vent Mode: PSV;CPAP FiO2 (%):  [30 %] 30 % Set Rate:  [14 bmp] 14 bmp Vt Set:  [600 mL] 600 mL PEEP:  [5 cmH20] 5 cmH20 Pressure Support:  [5 cmH20-10 cmH20] 5 cmH20 Plateau Pressure:  [13 cmH20-15 cmH20] 13 cmH20   Intake/Output Summary (Last 24 hours) at 10/30/2023 1130 Last data filed at 10/30/2023 1000 Gross per 24 hour  Intake 2210.32 ml  Output 4400 ml  Net -2189.68 ml   Filed Weights   10/25/23 0441 10/26/23 0500 10/27/23 0500  Weight: 119.3 kg 118.2 kg 118.9 kg    Physical Exam: General: acute on chronically ill appearing male, laying in bed, seems a bit anxious HENT: anicteric sclera, perrla, mmm, ett, ogt  Respiratory: vented, minimal settings, clear bilaterally  Cardiovascular: irregularly irregular rhythm,  no appreciable murmur GI: soft, midline laparotomy dressing cdi, ostomy is pink and beefy  Extremities:  anasarca  Neuro: awake, spontaneously tracks provider, following commands  GU: foley  Bcx 10/24/23 GPC with BCID Enteroccus faecalis  Resolved Hospital Problem list    Assessment & Plan:  Septic shock in the setting of small bowel perforation s/p ex lap subtotal colectomy and ileostomy placement Enterococcus fecalis bacteremia  - ID following, appreciate recs  - Surgery following, appreciate post-op management  - con't zosyn for 2 more weeks; micafungin dc 2/19   - stop steroids  - pressors stopped  - con't lovenox ppx - needs TEE when stable per ID - strict NPO   Atrial fibrillation likely 2/2 sepsis, critical illness  - con't amio gtt  - tele monitoring  - will not anticoagulate yet, hope to resolve with resolution of critical illness   Postoperative ventilator management Sedation weaned off. Patient pulls okay volume on SBT but becomes tachypneic and tachycardic. Concern for generalized deconditioning, loss of abdominal muscle use with recent surgery.  - start PO oxy and klonopin to help with rate control, anxiety  - failed 2/20 AM SBT  - full mechanical vent support - lung protective ventilation 6-8cc/kg Vt - VAP and PAD bundle in place  - titrate FiO2 to sat goal >92  - maintain peak/plats <30, driving pressures <16    Acute kidney injury on chronic kidney disease stage IIIa, improving - lasix 40mg  IV BID - trend bmp, mag, phos - replete elytes - strict I&O - Avoid nephrotoxic agents, renally dose medications - ensure adequate renal perfusion   Chronic diastolic heart failure Repeat echo 2/16 EF 50-55%, no vegetation  -Home anticoagulation-Eliquis on hold- start per surgery   Anemia of chronic disease -Continue to monitor  High risk for protein calorie malnutrition  - con't TPN - trickle feed start 2/20  - strict NPO   Best Practice (right click and "Reselect all SmartList Selections" daily)   Diet/type: tubefeeds, NPO, and TPN DVT prophylaxis  LMWH Pressure ulcer(s): N/A GI prophylaxis: PPI Lines: Central line and yes and it is still needed Foley:  Yes, and it is still needed Code Status:  full code Last date of multidisciplinary goals of care discussion: updated wife at bedside 2/20  CC time: 35  Cristopher Peru, PA-C [facility] 10/30/23 11:30 AM  Please see [email or web address] for pager details.  From 7A-7P if no response, please call [phone] After hours, please call ELink [phone]

## 2023-10-30 NOTE — Progress Notes (Signed)
3 Days Post-Op   Subjective/Chief Complaint: No acute changes Remains of the vent   Objective: Vital signs in last 24 hours: Temp:  [97 F (36.1 C)-99.3 F (37.4 C)] 97.9 F (36.6 C) (02/20 0600) Pulse Rate:  [50-122] 70 (02/20 0600) Resp:  [13-33] 14 (02/20 0600) BP: (86-150)/(55-110) 88/61 (02/20 0600) SpO2:  [88 %-100 %] 100 % (02/20 0600) Arterial Line BP: (82-142)/(69-79) 82/72 (02/19 1300) FiO2 (%):  [30 %] 30 % (02/20 0729) Last BM Date : 10/29/23  Intake/Output from previous day: 02/19 0701 - 02/20 0700 In: 2205.6 [I.V.:1649; IV Piggyback:556.6] Out: 4400 [Urine:4100; Emesis/NG output:300] Intake/Output this shift: No intake/output data recorded.  Exam: On vent.  Tracking with his eyes Abdomen soft, VAC in place, ostomy pink  Lab Results:  Recent Labs    10/28/23 0552 10/29/23 0450  WBC 19.8* 20.6*  HGB 8.8* 8.1*  HCT 25.4* 24.6*  PLT 337 317   BMET Recent Labs    10/29/23 0450 10/30/23 0444  NA 129* 141  K 3.1* 2.9*  CL 98 103  CO2 20* 27  GLUCOSE 161* 122*  BUN 23* 26*  CREATININE 1.63* 1.65*  CALCIUM 8.0* 7.9*   PT/INR No results for input(s): "LABPROT", "INR" in the last 72 hours. ABG No results for input(s): "PHART", "HCO3" in the last 72 hours.  Invalid input(s): "PCO2", "PO2"  Studies/Results: No results found.  Anti-infectives: Anti-infectives (From admission, onward)    Start     Dose/Rate Route Frequency Ordered Stop   10/25/23 1400  piperacillin-tazobactam (ZOSYN) IVPB 3.375 g        3.375 g 12.5 mL/hr over 240 Minutes Intravenous Every 8 hours 10/25/23 0942     10/25/23 0000  ceFEPIme (MAXIPIME) 2 g in sodium chloride 0.9 % 100 mL IVPB  Status:  Discontinued        2 g 200 mL/hr over 30 Minutes Intravenous Every 12 hours 10/24/23 1853 10/25/23 0942   10/24/23 2300  piperacillin-tazobactam (ZOSYN) IVPB 3.375 g  Status:  Discontinued        3.375 g 12.5 mL/hr over 240 Minutes Intravenous Every 8 hours 10/24/23 1509  10/24/23 1853   10/24/23 2300  metroNIDAZOLE (FLAGYL) IVPB 500 mg  Status:  Discontinued        500 mg 100 mL/hr over 60 Minutes Intravenous Every 12 hours 10/24/23 1838 10/25/23 0942   10/24/23 2000  micafungin (MYCAMINE) 100 mg in sodium chloride 0.9 % 100 mL IVPB  Status:  Discontinued        100 mg 105 mL/hr over 1 Hours Intravenous Every 24 hours 10/24/23 1838 10/29/23 1152   10/24/23 1945  vancomycin (VANCOREADY) IVPB 2000 mg/400 mL        2,000 mg 200 mL/hr over 120 Minutes Intravenous NOW 10/24/23 1854 10/24/23 2239   10/24/23 1854  vancomycin variable dose per unstable renal function (pharmacist dosing)  Status:  Discontinued         Does not apply See admin instructions 10/24/23 1854 10/25/23 0942   10/24/23 1245  ceFEPIme (MAXIPIME) 2 g in sodium chloride 0.9 % 100 mL IVPB        2 g 200 mL/hr over 30 Minutes Intravenous  Once 10/24/23 1236 10/24/23 1334   10/24/23 1245  metroNIDAZOLE (FLAGYL) IVPB 500 mg        500 mg 100 mL/hr over 60 Minutes Intravenous  Once 10/24/23 1236 10/24/23 1453       Assessment/Plan:  s/p ex lap with subtotal abdominal colectomy in  discontinuity, Dr. 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 Magnus Ivan 2/17 for pneumoperitoneum and gangrene of colon   -NG output decreased and thin.  OK from a surgical standpoint to try trickle tube feeds -continue antibiotics per ID -wound VAC in place -CBC pending Abigail Miyamoto 10/30/2023

## 2023-10-30 NOTE — Progress Notes (Addendum)
Nutrition Follow-up  DOCUMENTATION CODES:   Not applicable  INTERVENTION:   TPN to meet nutrition needs until pt able to tolerate EN Pharmacy to continue to monitor electrolytes for replacement needs  Initiate Tube Feeding via OG tube:  Vital 1.5 @ 20 ml/hr   As able recommend increasing by 10 ml every 8 hours to goal rate of 60 ml/hr 60 ml Prosource TF20 TID Provides: 2400 kcal, 157 grams protein and 1100 ml free water  Recommend cortrak placement   NUTRITION DIAGNOSIS:   Inadequate oral intake related to inability to eat as evidenced by NPO status. Ongoing.   GOAL:   Patient will meet greater than or equal to 90% of their needs Met with TPN and initiation of TF  MONITOR:   Labs, Skin, I & O's  REASON FOR ASSESSMENT:   Consult Enteral/tube feeding initiation and management  ASSESSMENT:   58 yo male admitted with bowel perforation, massive feculent peritonitis. S/P ex lap, subtotal abdominal colectomy 2/14. PMH includes HTN, HLD, gout, brain aneurysm, prior alcohol abuse, former smoker.  Pt discussed during ICU rounds and with RN and MD.  Rip Harbour to start trickle TF today via OG tube per surgery and CCM MDs. Pt has yet to wean due to weakness.   2/14 -  s/p ex lap with subtotal abd colectomy left in discontinuity  2/15 - s/p re-exploration with creation of end ileostomy and closure  2/17 - s/p OR washout and end ileostomy  2/18 - started TPN 2/20 -  starting trickle TF, TPN advancing to goal this PM   Medications reviewed and include: lasix, SSI every 4 hours, protonix Amiodarone Precedex Fentanyl  KCl x 5  TPN advancing to 70 ml/hr this PM which will provide 2277 kcal and 132 grams protein   Labs reviewed:  K 2.9 Albumin <1.5 TG 117 CBG's: 117-175    Diet Order:   Diet Order             Diet NPO time specified  Diet effective now                   EDUCATION NEEDS:   Not appropriate for education at this time  Skin:  Skin Assessment:  Reviewed RN Assessment (surgical abd incisions)  Last BM:  50 ml via ileostomy  Height:   Ht Readings from Last 1 Encounters:  10/29/23 6\' 1"  (1.854 m)    Weight:   Wt Readings from Last 1 Encounters:  10/27/23 118.9 kg    Ideal Body Weight:  83.6 kg  BMI:  Body mass index is 34.58 kg/m.  Estimated Nutritional Needs:   Kcal:  2200-2400  Protein:  130-150 gm  Fluid:  2.2-2.4 L  Seena Face P., RD, LDN, CNSC See AMiON for contact information

## 2023-10-30 NOTE — Consult Note (Signed)
WOC Nurse Consult Note:  Reason for Consult: apply NPWT to midline surgical incision  Wound type: full thickness surgical  Pressure Injury POA: NA  Measurement: 21 cm x 3.8 cm x 4.7 cm  Wound bed: pale pink, some subcutaneous tissue noted and small area of brown necrotic tissue at umbilical area  Drainage (amount, consistency, odor) minimal serosanguinous  Periwound: intact, has ostomy to RLQ can be pouched separately  Dressing procedure/placement/frequency: Removed current wet to dry dressing.   Cleansed wound with normal saline Cut a barrier ring in half and filled in crease at umbilicus  Filled wound with  2 pieces of black foam   Sealed NPWT dressing at HG Patient received IV pain medication per bedside nurse prior to dressing change Patient tolerated procedure well  WOC nurse will continue to provide NPWT dressing changes due to the complexity of the dressing change Mon/Thursday; next change Monday 11/03/2023, 1 medium vac kit in room.    WOC Nurse ostomy follow up Stoma type/location: RLQ ileostomy  Stomal assessment/size: 1 1/4" slightly above skin level, red moist productive of brown liquid effluent  Peristomal assessment:  intact  Treatment options for stomal/peristomal skin: 2" barrier ring  Output approximately 50 mls brown liquid  Ostomy pouching: 2 piece 2 1/4" soft convex skin barrier [facility] [place] #[id number], 2 1/4" pouch [place] #[id number] and 2" barrier ring Hart [place] [phone]  Education provided: none, patient is intubated in ICU setting.  Will continue to follow ostomy and educate patient and family as patient is ready.   (3) sets of above 2 1/4" convex system at bedside with barrier rings   Enrolled patient in [place] Secure Start Discharge program: NO

## 2023-10-31 DIAGNOSIS — R6521 Severe sepsis with septic shock: Secondary | ICD-10-CM | POA: Diagnosis not present

## 2023-10-31 DIAGNOSIS — I5031 Acute diastolic (congestive) heart failure: Secondary | ICD-10-CM | POA: Diagnosis not present

## 2023-10-31 DIAGNOSIS — J9601 Acute respiratory failure with hypoxia: Secondary | ICD-10-CM | POA: Diagnosis not present

## 2023-10-31 DIAGNOSIS — A419 Sepsis, unspecified organism: Secondary | ICD-10-CM | POA: Diagnosis not present

## 2023-10-31 DIAGNOSIS — E46 Unspecified protein-calorie malnutrition: Secondary | ICD-10-CM

## 2023-10-31 DIAGNOSIS — N1831 Chronic kidney disease, stage 3a: Secondary | ICD-10-CM

## 2023-10-31 LAB — BASIC METABOLIC PANEL
Anion gap: 13 (ref 5–15)
BUN: 31 mg/dL — ABNORMAL HIGH (ref 6–20)
CO2: 24 mmol/L (ref 22–32)
Calcium: 7.6 mg/dL — ABNORMAL LOW (ref 8.9–10.3)
Chloride: 101 mmol/L (ref 98–111)
Creatinine, Ser: 1.39 mg/dL — ABNORMAL HIGH (ref 0.61–1.24)
GFR, Estimated: 59 mL/min — ABNORMAL LOW (ref >60)
Glucose, Bld: 126 mg/dL — ABNORMAL HIGH (ref 70–99)
Potassium: 3.7 mmol/L (ref 3.5–5.1)
Sodium: 138 mmol/L (ref 135–145)

## 2023-10-31 LAB — GLUCOSE, CAPILLARY
Glucose-Capillary: 150 mg/dL — ABNORMAL HIGH (ref 70–99)
Glucose-Capillary: 134 mg/dL — ABNORMAL HIGH (ref 70–99)
Glucose-Capillary: 130 mg/dL — ABNORMAL HIGH (ref 70–99)
Glucose-Capillary: 140 mg/dL — ABNORMAL HIGH (ref 70–99)
Glucose-Capillary: 122 mg/dL — ABNORMAL HIGH (ref 70–99)
Glucose-Capillary: 135 mg/dL — ABNORMAL HIGH (ref 70–99)

## 2023-10-31 LAB — PHOSPHORUS: Phosphorus: 2.6 mg/dL (ref 2.5–4.6)

## 2023-10-31 LAB — MAGNESIUM: Magnesium: 1.5 mg/dL — ABNORMAL LOW (ref 1.7–2.4)

## 2023-10-31 LAB — TRIGLYCERIDES: Triglycerides: 181 mg/dL — ABNORMAL HIGH (ref <150)

## 2023-10-31 MED ORDER — VITAL 1.5 CAL PO LIQD
1000.0000 mL | ORAL | Status: DC
  Administered 2023-10-31: 1000 mL

## 2023-10-31 MED ORDER — PROSOURCE TF20 ENFIT COMPATIBL EN LIQD
60.0000 mL | Freq: Three times a day (TID) | ENTERAL | Status: DC
  Administered 2023-10-31 – 2023-11-05 (×16): 60 mL
  Filled 2023-10-31 (×16): qty 60

## 2023-10-31 MED ORDER — MIDODRINE HCL 5 MG PO TABS
5.0000 mg | ORAL_TABLET | Freq: Three times a day (TID) | ORAL | Status: DC
  Administered 2023-10-31 – 2023-11-02 (×6): 5 mg
  Filled 2023-10-31 (×6): qty 1

## 2023-10-31 MED ORDER — AMIODARONE LOAD VIA INFUSION
150.0000 mg | Freq: Once | INTRAVENOUS | Status: AC
  Administered 2023-10-31: 150 mg via INTRAVENOUS
  Filled 2023-10-31: qty 83.34

## 2023-10-31 MED ORDER — POTASSIUM CHLORIDE 10 MEQ/50ML IV SOLN
10.0000 meq | INTRAVENOUS | Status: AC
Start: 2023-10-31 — End: 2023-10-31
  Administered 2023-10-31 (×4): 10 meq via INTRAVENOUS
  Filled 2023-10-31 (×4): qty 50

## 2023-10-31 MED ORDER — FENTANYL CITRATE PF 50 MCG/ML IJ SOSY
50.0000 ug | PREFILLED_SYRINGE | INTRAMUSCULAR | Status: DC | PRN
  Administered 2023-10-31 – 2023-11-04 (×2): 50 ug via INTRAVENOUS
  Filled 2023-10-31 (×2): qty 1

## 2023-10-31 MED ORDER — ACETAMINOPHEN 160 MG/5ML PO SOLN
650.0000 mg | ORAL | Status: AC | PRN
  Administered 2023-10-31 – 2023-11-05 (×11): 650 mg
  Filled 2023-10-31 (×11): qty 20.3

## 2023-10-31 MED ORDER — AMIODARONE HCL 200 MG PO TABS: 200.0000 mg | ORAL_TABLET | Freq: Every day | ORAL | Status: DC

## 2023-10-31 MED ORDER — FUROSEMIDE 10 MG/ML IJ SOLN
40.0000 mg | Freq: Two times a day (BID) | INTRAMUSCULAR | Status: AC
  Administered 2023-10-31 – 2023-11-02 (×4): 40 mg via INTRAVENOUS
  Filled 2023-10-31 (×4): qty 4

## 2023-10-31 MED ORDER — AMIODARONE IV BOLUS ONLY 150 MG/100ML
150.0000 mg | Freq: Once | INTRAVENOUS | Status: AC
  Administered 2023-10-31: 150 mg via INTRAVENOUS
  Filled 2023-10-31: qty 100

## 2023-10-31 MED ORDER — TRAVASOL 10 % IV SOLN
INTRAVENOUS | Status: AC
  Filled 2023-10-31: qty 974.4

## 2023-10-31 MED ORDER — AMIODARONE HCL IN DEXTROSE 360-4.14 MG/200ML-% IV SOLN
60.0000 mg/h | INTRAVENOUS | Status: DC
  Administered 2023-10-31 (×2): 60 mg/h via INTRAVENOUS
  Filled 2023-10-31 (×3): qty 200

## 2023-10-31 MED ORDER — AMIODARONE HCL IN DEXTROSE 360-4.14 MG/200ML-% IV SOLN
30.0000 mg/h | INTRAVENOUS | Status: DC
  Administered 2023-11-01 (×4): 60 mg/h via INTRAVENOUS
  Administered 2023-11-02: 30 mg/h via INTRAVENOUS
  Administered 2023-11-02 (×2): 60 mg/h via INTRAVENOUS
  Administered 2023-11-03 – 2023-11-04 (×3): 30 mg/h via INTRAVENOUS
  Filled 2023-10-31 (×9): qty 200

## 2023-10-31 MED ORDER — AMIODARONE HCL 200 MG PO TABS
400.0000 mg | ORAL_TABLET | Freq: Two times a day (BID) | ORAL | Status: DC
  Administered 2023-10-31: 400 mg
  Filled 2023-10-31: qty 2

## 2023-10-31 MED ORDER — VITAL 1.5 CAL PO LIQD
1000.0000 mL | ORAL | Status: DC
  Administered 2023-10-31 – 2023-11-04 (×3): 1000 mL

## 2023-10-31 MED ORDER — ORAL CARE MOUTH RINSE: 15.0000 mL | OROMUCOSAL | Status: DC | PRN

## 2023-10-31 MED ORDER — METOPROLOL TARTRATE 5 MG/5ML IV SOLN
2.5000 mg | INTRAVENOUS | Status: DC | PRN
  Administered 2023-10-31 – 2023-11-01 (×2): 5 mg via INTRAVENOUS
  Administered 2023-11-01: 2.5 mg via INTRAVENOUS
  Administered 2023-11-01: 5 mg via INTRAVENOUS
  Administered 2023-11-02: 2.5 mg via INTRAVENOUS
  Filled 2023-10-31 (×4): qty 5

## 2023-10-31 MED ORDER — MAGNESIUM SULFATE 2 GM/50ML IV SOLN
2.0000 g | Freq: Once | INTRAVENOUS | Status: AC
  Administered 2023-10-31: 2 g via INTRAVENOUS
  Filled 2023-10-31: qty 50

## 2023-10-31 MED ORDER — ORAL CARE MOUTH RINSE
15.0000 mL | OROMUCOSAL | Status: DC
  Administered 2023-10-31 – 2023-11-05 (×20): 15 mL via OROMUCOSAL

## 2023-10-31 NOTE — Progress Notes (Addendum)
NAME:  Daniel Butler, MRN:  [id number], DOB:  March 01, 1966, LOS: 7 ADMISSION DATE:  10/24/2023, CONSULTATION DATE:  10/24/2023 REFERRING MD:  Dr. Bedelia Person - [facility], CHIEF COMPLAINT:  Bowel perf   History of Present Illness:  Daniel Butler is a 58 y.o. with a past medical history significant for orthostatic hypotension, chronic diastolic congestive heart failure, persistent atrial fibrillation on Eliquis, CKD stage IIIa, anemia of chronic disease, severe alcohol use disorder, chronic ulcer of the left foot, prior SAH secondary to aneurysm s/p clipping, alcohol use, and depression who presented to the ED 2/14 from [facility] and rehab with complaints of abdominal pain and swelling with associated nausea and small-volume emesis that began day prior to admission.  States he has felt unwell x 3 days.  On ED arrival patient was seen febrile with temperature 103.4, tachypneic, and tachycardic.  Lab work significant for K2.4, glucose 116, creatinine 3.12, anion gap 26, alkaline phosphate 33, albumin 2.5, lactic 10.3, WBC 19.6.  CT abdomen and pelvis obtained which revealed substantial free air concerning for acute bowel perforation.  Surgery consulted and patient underwent exploratory laparotomy which revealed diffuse colonic distention with ischemia and gangrenous colonic necrosis with scattered areas of perforation and massive flocculent peritonitis.  Postprocedure patient remained intubated and sedated, PCCM consulted for further management admission.  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 OR for subtotal colectomy, intestinal discontinuity  2/15: con't on pressor support, ID consult  2/17: exploratory laparotomy, end ileostomy 2/18: goal to wake up and wean, will need TPN  2/19: waking, gets tachypneic, tachycardic with SBT 2/20: failed SBT. Starting trickle feeds, lasix BID, PO oxy and klonopin   Interim History / Subjective:  Tolerating trophic feeds.  General surgery is comfortable with increasing to goal.  Still weak on SBT  Objective   Blood pressure 101/73, pulse 88, temperature 100 F (37.8 C), resp. rate (!) 21, height 6\' 1"  (1.854 m), weight 113.6 kg, SpO2 (!) 89%.    Vent Mode: PSV;CPAP FiO2 (%):  [30 %] 30 % Set Rate:  [14 bmp] 14 bmp Vt Set:  [600 mL] 600 mL PEEP:  [5 cmH20] 5 cmH20 Pressure Support:  [8 cmH20] 8 cmH20 Plateau Pressure:  [15 cmH20-17 cmH20] 15 cmH20   Intake/Output Summary (Last 24 hours) at 10/31/2023 1426 Last data filed at 10/31/2023 1400 Gross per 24 hour  Intake 3091.44 ml  Output 4165 ml  Net -1073.56 ml   Filed Weights   10/26/23 0500 10/27/23 0500 10/31/23 0500  Weight: 118.2 kg 118.9 kg 113.6 kg    Physical Exam: General: acute on chronically ill appearing male, laying in bed, appears calmer today HENT: ET tube, OGT in place. Respiratory: Clear bilaterally.  Tolerating SBT Cardiovascular: Rate controlled atrial fibrillation.  Heart sounds unremarkable.  Extremities cool. GI: soft, midline VAC dressing.  Ileostomy intact with normal output Extremities: Dependent edema. Neuro: awake, spontaneously tracks provider, following commands  awake and follows commands seems stronger than he was yesterday.   GU: Foley  catheter in place.  Clear urine.  Ancillary tests personally reviewed   Assessment & Plan:  Septic shock in the setting of small bowel perforation s/p ex lap subtotal colectomy and ileostomy placement Enterococcus fecalis bacteremia  Atrial fibrillation likely 2/2 sepsis, critical illness  Acute hypoxic respiratory failure Acute HFpEF with volume overload Protein calorie malnutrition Acute kidney injury on chronic kidney disease stage IIIa, improving  Plan:   -Continue daily SBT.  Still remains weak due to a combination of diaphragmatic weakness from intra-abdominal infection. loss of abdominal wall integrity, volume overload and generalized deconditioning. -Continue to diurese and follow renal function. -Keep off continuous sedation.  Manage pain with as needed analgesia -Advance tube feeds progressively to goal and stop TPN once tolerating for at least 24 hours.  -Complete 14 days of Zosyn.  Remove PICC and give patient line holiday once TPN is stopped. -Transition to oral amiodarone  Best Practice (right click and "Reselect all SmartList Selections" daily)   Diet/type: tubefeeds, NPO, and TPN DVT prophylaxis LMWH Pressure ulcer(s): N/A GI prophylaxis: PPI Lines: Central line and yes and it is still needed Foley:  Yes, and it is still needed Code Status:  full code Last date of multidisciplinary goals of care discussion: updated wife at bedside 2/20  CRITICAL CARE Performed by: Lynnell Catalan   Total critical care time: 40 minutes  Critical care time was exclusive of separately billable procedures and treating other patients.  Critical care was necessary to treat or prevent imminent or life-threatening deterioration.  Critical care was time spent personally by me on the following activities: development of treatment plan with patient and/or surrogate as well as nursing, discussions with consultants, evaluation of patient's response to treatment, examination of patient,  obtaining history from patient or surrogate, ordering and performing treatments and interventions, ordering and review of laboratory studies, ordering and review of radiographic studies, pulse oximetry, re-evaluation of patient's condition and participation in multidisciplinary rounds.  Lynnell Catalan, MD [facility] ICU Physician [facility]  Pager: [phone] Mobile: [phone] After hours: [phone].

## 2023-10-31 NOTE — Progress Notes (Signed)
Nutrition Follow-up  DOCUMENTATION CODES:   Not applicable  INTERVENTION:   TPN to meet nutrition needs until pt able to tolerate EN Pharmacy to continue to monitor electrolytes for replacement needs  Tube Feeding via OG tube:  Vital 1.5 @ 30 ml/hr increasing by 10 ml every 12 hours to goal rate of 60 ml/hr 60 ml Prosource TF20 TID Provides: 2400 kcal, 157 grams protein and 1100 ml free water  Recommend cortrak placement Monday if tolerating TF at goal    NUTRITION DIAGNOSIS:   Inadequate oral intake related to inability to eat as evidenced by NPO status. Ongoing.   GOAL:   Patient will meet greater than or equal to 90% of their needs Met with TPN and initiation of TF  MONITOR:   Labs, Skin, I & O's  REASON FOR ASSESSMENT:   Consult Enteral/tube feeding initiation and management  ASSESSMENT:   58 yo male admitted with bowel perforation, massive feculent peritonitis. S/P ex lap, subtotal abdominal colectomy 2/14. PMH includes HTN, HLD, gout, brain aneurysm, prior alcohol abuse, former smoker.  Pt discussed during ICU rounds and with RN and MD.  Rip Harbour to advanced TF slowly today via OG tube per surgery and CCM MDs.  Spoke with pt's wife at bedside.   2/14 -  s/p ex lap with subtotal abd colectomy left in discontinuity  2/15 - s/p re-exploration with creation of end ileostomy and closure  2/17 - s/p OR washout and end ileostomy  2/18 - started TPN 2/20 -  starting trickle TF, TPN advancing to goal this PM  2/21 - advance TF by 10 ml every 12 hours to goal rate of 60 ml/hr  Medications reviewed and include: lasix, SSI every 4 hours, protonix Levophed @ 2 mcg  TPN @ 70 ml/hr provides: 1670 kcal and 97 grams protein   Labs reviewed:  Mag 1.5 CBG's: 130-150  UOP: 4055 ml  VAC: 10 ml  OG tube: per chest xray tube in satisfactory position   Diet Order:   Diet Order             Diet NPO time specified  Diet effective now                   EDUCATION  NEEDS:   Not appropriate for education at this time  Skin:  Skin Assessment: Reviewed RN Assessment (surgical abd incisions)  Last BM:  175 ml via ileostomy  Height:   Ht Readings from Last 1 Encounters:  10/29/23 6\' 1"  (1.854 m)    Weight:   Wt Readings from Last 1 Encounters:  10/31/23 113.6 kg    Ideal Body Weight:  83.6 kg  BMI:  Body mass index is 33.04 kg/m.  Estimated Nutritional Needs:   Kcal:  2200-2400  Protein:  130-150 gm  Fluid:  2.2-2.4 L  Deedee Lybarger P., RD, LDN, CNSC See AMiON for contact information

## 2023-10-31 NOTE — Progress Notes (Signed)
4 Days Post-Op   Subjective/Chief Complaint: No acute changes  Remains on vent Tolerating TF's at 20 cc/hr   Objective: Vital signs in last 24 hours: Temp:  [99.1 F (37.3 C)-100.6 F (38.1 C)] 100 F (37.8 C) (02/21 0700) Pulse Rate:  [74-111] 111 (02/21 0600) Resp:  [14-22] 14 (02/21 0700) BP: (81-135)/(57-96) 125/94 (02/21 0700) SpO2:  [79 %-100 %] 100 % (02/21 1046) FiO2 (%):  [30 %] 30 % (02/21 1046) Weight:  [113.6 kg] 113.6 kg (02/21 0500) Last BM Date : 10/30/23  Intake/Output from previous day: 02/20 0701 - 02/21 0700 In: 3034.7 [I.V.:2327.5; NG/GT:307; IV Piggyback:400.3] Out: 4440 [Urine:4055; Emesis/NG output:200; Drains:10; Stool:175] Intake/Output this shift: No intake/output data recorded.  Exam: On vent Abdomen soft, wound VAC in place Ostomy pink, working well  Lab Results:  Recent Labs    10/29/23 0450 10/30/23 0755  WBC 20.6* 15.7*  HGB 8.1* 8.1*  HCT 24.6* 25.0*  PLT 317 243   BMET Recent Labs    10/29/23 0450 10/30/23 0444  NA 129* 141  K 3.1* 2.9*  CL 98 103  CO2 20* 27  GLUCOSE 161* 122*  BUN 23* 26*  CREATININE 1.63* 1.65*  CALCIUM 8.0* 7.9*   PT/INR No results for input(s): "LABPROT", "INR" in the last 72 hours. ABG No results for input(s): "PHART", "HCO3" in the last 72 hours.  Invalid input(s): "PCO2", "PO2"  Studies/Results: No results found.  Anti-infectives: Anti-infectives (From admission, onward)    Start     Dose/Rate Route Frequency Ordered Stop   10/25/23 1400  piperacillin-tazobactam (ZOSYN) IVPB 3.375 g        3.375 g 12.5 mL/hr over 240 Minutes Intravenous Every 8 hours 10/25/23 0942 11/07/23 2359   10/25/23 0000  ceFEPIme (MAXIPIME) 2 g in sodium chloride 0.9 % 100 mL IVPB  Status:  Discontinued        2 g 200 mL/hr over 30 Minutes Intravenous Every 12 hours 10/24/23 1853 10/25/23 0942   10/24/23 2300  piperacillin-tazobactam (ZOSYN) IVPB 3.375 g  Status:  Discontinued        3.375 g 12.5 mL/hr over  240 Minutes Intravenous Every 8 hours 10/24/23 1509 10/24/23 1853   10/24/23 2300  metroNIDAZOLE (FLAGYL) IVPB 500 mg  Status:  Discontinued        500 mg 100 mL/hr over 60 Minutes Intravenous Every 12 hours 10/24/23 1838 10/25/23 0942   10/24/23 2000  micafungin (MYCAMINE) 100 mg in sodium chloride 0.9 % 100 mL IVPB  Status:  Discontinued        100 mg 105 mL/hr over 1 Hours Intravenous Every 24 hours 10/24/23 1838 10/29/23 1152   10/24/23 1945  vancomycin (VANCOREADY) IVPB 2000 mg/400 mL        2,000 mg 200 mL/hr over 120 Minutes Intravenous NOW 10/24/23 1854 10/24/23 2239   10/24/23 1854  vancomycin variable dose per unstable renal function (pharmacist dosing)  Status:  Discontinued         Does not apply See admin instructions 10/24/23 1854 10/25/23 0942   10/24/23 1245  ceFEPIme (MAXIPIME) 2 g in sodium chloride 0.9 % 100 mL IVPB        2 g 200 mL/hr over 30 Minutes Intravenous  Once 10/24/23 1236 10/24/23 1334   10/24/23 1245  metroNIDAZOLE (FLAGYL) IVPB 500 mg        500 mg 100 mL/hr over 60 Minutes Intravenous  Once 10/24/23 1236 10/24/23 1453       Assessment/Plan:  s/p  ex lap with subtotal abdominal colectomy in discontinuity, Dr. 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 Magnus Ivan 2/17 for pneumoperitoneum and gangrene of colon    -tolerating tube feeds at 20 cc/hr.  Advancing to 30.  If tolerated over the next 6 to 8 hours, ok to advance to goal -continue wound VAC -vent per CCM  Abigail Miyamoto MD 10/31/2023

## 2023-10-31 NOTE — Progress Notes (Addendum)
PHARMACY - TOTAL PARENTERAL NUTRITION CONSULT NOTE   Indication: Prolonged ileus  Patient Measurements: Height: 6\' 1"  (185.4 cm) Weight: 113.6 kg (250 lb 7.1 oz) IBW/kg (Calculated) : 79.9 TPN AdjBW (KG): 86.6 Body mass index is 33.04 kg/m. Usual Weight: 120-130 kg   Assessment:  Patient admitted 2/14 with septic shock in the setting of bowel perforation. Patient has enterococcus bacteremia. TTE on 2/16 negative for vegetations. ID following. Due to prolonged ileus postoperatively, pharmacy consulted to start TPN on 2/18.   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   Glucose / Insulin: CBGs < 180. HbA1C 5.6% in 2020 (PTA empagliflozin for heart failure). 8u SSI in past 24h Electrolytes: waiting on electrolytes - pt is hard stick Renal: BUN 26, Scr 1.65 (baseline ~ 1.5)  Hepatic:  LFTs WNL, albumin<1.5;  TG 117 Intake / Output; MIVF: 2.6L UOP in last 24 hours (s/p lasix 2/19 + 2/20); 300 mL NGT output, LBM 2/20. Net +10L GI Imaging:  2/14 CT Ab/P - Substantial free intraperitoneal gas with ascites, exact site of perforation not well seen although colonic etiology favored given the diffuse colonic distension.  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; ABThera wound VAC application  2/17 exploratory laparotomy, end ileostomy, fascia closure   Central access: PICC 2/15  TPN start date: 2/18   Nutritional Goals: Goal TPN rate = 58ml/hr to provide 2277 kcal and 132 g of protein  RD Assessment: Estimated Needs Total Energy Estimated Needs: 2200-2400 Total Protein Estimated Needs: 130-150 gm Total Fluid Estimated Needs: 2.2-2.4 L  Current Nutrition:  NPO   TPN + TF at 30 ml/hr should provide 100% of estimated needs  Plan:  Continue TPN at 70 mL/hr at 1800 provides 1670 kcal and 97 gm protein. TPN + TF at 30 ml/hr should provide 100% of estimated needs. If pt tolerates TF advancement, may be able to d/c TPN tomorrow. Electrolytes in TPN: Na 30 mEq/L, K 40 mEq/L, Ca 2 mEq/L, Mg 0 mEq/L, and Phos 12 mmol/L. Cl: Ace 1: 1 Continue standard MVI and trace elements to TPN Continue Sensitive q4h SSI and adjust as needed  Will replace electrolytes outside of TPN if needed since phlebotomy unable to get labs yet today due to pt being hard stick. They are going to try and come again but if they are unable to get labs, will get foot stick.  Christoper Fabian, PharmD, BCPS Please see amion for complete clinical pharmacist phone list 10/31/2023 12:42 PM  ADDENDUM (1525) Labs finally drawn K 3.7 (noted that hemolysis at this level may affect result so may be falsely low), Phos 2.6, Mg 1.5.  Plan: Magnesium 2gm IV now Potassium 10 mEq IV q1h x 4 F/u Mg, Phos, BMET in a.m.  Christoper Fabian, PharmD, BCPS Please see amion for complete clinical pharmacist phone list 10/31/2023 3:27 PM

## 2023-10-31 NOTE — Procedures (Signed)
Extubation Procedure Note  Patient Details:   Name: Daniel Butler DOB: 04/16/1966 MRN: [id number]   Airway Documentation:    Vent end date: 10/31/23 Vent end time: 1645   Evaluation  O2 sats: stable throughout Complications: No apparent complications Patient did tolerate procedure well. Bilateral Breath Sounds: Clear, Diminished   Patient extubated per MD's order and place on 3L [place] with RT and Rn at bedside. Cuff leak present prior to extubation, no stridor noted post. Patient able to speak, aware to place and time.     Yes  [name] [name] 10/31/2023, 4:59 PM

## 2023-11-01 DIAGNOSIS — R6521 Severe sepsis with septic shock: Secondary | ICD-10-CM | POA: Diagnosis not present

## 2023-11-01 DIAGNOSIS — A419 Sepsis, unspecified organism: Secondary | ICD-10-CM | POA: Diagnosis not present

## 2023-11-01 DIAGNOSIS — I5031 Acute diastolic (congestive) heart failure: Secondary | ICD-10-CM | POA: Diagnosis not present

## 2023-11-01 DIAGNOSIS — J9601 Acute respiratory failure with hypoxia: Secondary | ICD-10-CM | POA: Diagnosis not present

## 2023-11-01 LAB — MAGNESIUM: Magnesium: 1.6 mg/dL — ABNORMAL LOW (ref 1.7–2.4)

## 2023-11-01 LAB — BASIC METABOLIC PANEL
Anion gap: 9 (ref 5–15)
BUN: 37 mg/dL — ABNORMAL HIGH (ref 6–20)
CO2: 29 mmol/L (ref 22–32)
Calcium: 7.7 mg/dL — ABNORMAL LOW (ref 8.9–10.3)
Chloride: 102 mmol/L (ref 98–111)
Creatinine, Ser: 1.27 mg/dL — ABNORMAL HIGH (ref 0.61–1.24)
GFR, Estimated: >60 mL/min (ref >60)
Glucose, Bld: 148 mg/dL — ABNORMAL HIGH (ref 70–99)
Potassium: 3.3 mmol/L — ABNORMAL LOW (ref 3.5–5.1)
Sodium: 140 mmol/L (ref 135–145)

## 2023-11-01 LAB — GLUCOSE, CAPILLARY
Glucose-Capillary: 145 mg/dL — ABNORMAL HIGH (ref 70–99)
Glucose-Capillary: 150 mg/dL — ABNORMAL HIGH (ref 70–99)
Glucose-Capillary: 141 mg/dL — ABNORMAL HIGH (ref 70–99)
Glucose-Capillary: 156 mg/dL — ABNORMAL HIGH (ref 70–99)
Glucose-Capillary: 136 mg/dL — ABNORMAL HIGH (ref 70–99)
Glucose-Capillary: 129 mg/dL — ABNORMAL HIGH (ref 70–99)

## 2023-11-01 LAB — TRIGLYCERIDES: Triglycerides: 181 mg/dL — ABNORMAL HIGH (ref <150)

## 2023-11-01 LAB — PHOSPHORUS: Phosphorus: 2.5 mg/dL (ref 2.5–4.6)

## 2023-11-01 MED ORDER — MAGNESIUM SULFATE 2 GM/50ML IV SOLN
2.0000 g | Freq: Once | INTRAVENOUS | Status: AC
  Administered 2023-11-01: 2 g via INTRAVENOUS
  Filled 2023-11-01: qty 50

## 2023-11-01 MED ORDER — METOPROLOL TARTRATE 25 MG PO TABS
12.5000 mg | ORAL_TABLET | Freq: Two times a day (BID) | ORAL | Status: DC
  Administered 2023-11-01 (×3): 12.5 mg
  Filled 2023-11-01 (×2): qty 1

## 2023-11-01 MED ORDER — OXYCODONE HCL 5 MG PO TABS
5.0000 mg | ORAL_TABLET | Freq: Four times a day (QID) | ORAL | Status: DC | PRN
  Filled 2023-11-01: qty 1

## 2023-11-01 MED ORDER — METOPROLOL TARTRATE 25 MG PO TABS
12.5000 mg | ORAL_TABLET | Freq: Two times a day (BID) | ORAL | Status: DC
  Filled 2023-11-01: qty 1

## 2023-11-01 MED ORDER — CLONAZEPAM 0.5 MG PO TABS
0.5000 mg | ORAL_TABLET | Freq: Two times a day (BID) | ORAL | Status: DC | PRN
  Administered 2023-11-02 – 2023-11-03 (×2): 0.5 mg
  Filled 2023-11-01 (×3): qty 1

## 2023-11-01 MED ORDER — TRAVASOL 10 % IV SOLN
INTRAVENOUS | Status: AC
  Filled 2023-11-01: qty 974.4

## 2023-11-01 MED ORDER — ENOXAPARIN SODIUM 120 MG/0.8ML IJ SOSY
115.0000 mg | PREFILLED_SYRINGE | Freq: Two times a day (BID) | INTRAMUSCULAR | Status: DC
  Administered 2023-11-01 – 2023-11-06 (×11): 115 mg via SUBCUTANEOUS
  Filled 2023-11-01 (×13): qty 0.76

## 2023-11-01 MED ORDER — CLONAZEPAM 0.5 MG PO TABS: 0.5000 mg | ORAL_TABLET | Freq: Two times a day (BID) | ORAL | Status: DC | PRN

## 2023-11-01 MED ORDER — POTASSIUM CHLORIDE 10 MEQ/50ML IV SOLN
10.0000 meq | INTRAVENOUS | Status: AC
Start: 2023-11-01 — End: 2023-11-01
  Administered 2023-11-01 (×4): 10 meq via INTRAVENOUS
  Filled 2023-11-01 (×4): qty 50

## 2023-11-01 MED ORDER — OXYCODONE HCL 5 MG PO TABS
5.0000 mg | ORAL_TABLET | Freq: Four times a day (QID) | ORAL | Status: DC | PRN
  Administered 2023-11-01 – 2023-11-04 (×3): 5 mg
  Filled 2023-11-01 (×5): qty 1

## 2023-11-01 MED ORDER — POTASSIUM CHLORIDE 20 MEQ PO PACK
40.0000 meq | PACK | Freq: Once | ORAL | Status: AC
  Administered 2023-11-01: 40 meq
  Filled 2023-11-01: qty 2

## 2023-11-01 NOTE — Progress Notes (Signed)
 PHARMACY - ANTICOAGULATION CONSULT NOTE  Pharmacy Consult for Lovenox   Indication: atrial fibrillation  Allergies  Allergen Reactions   Zetia [Ezetimibe] Nausea And Vomiting    Patient Measurements: Height: 6\' 1"  (185.4 cm) Weight: 115 kg (253 lb 8.5 oz) IBW/kg (Calculated) : 79.9  Vital Signs: Temp: 99.5 F (37.5 C) (02/22 0900) Temp Source: Bladder (02/22 0800) BP: 105/58 (02/22 0900) Pulse Rate: 99 (02/22 0900)  Labs: Recent Labs    10/30/23 0444 10/30/23 0755 10/31/23 1415 11/01/23 0612  HGB  --  8.1*  --   --   HCT  --  25.0*  --   --   PLT  --  243  --   --   CREATININE 1.65*  --  1.39* 1.27*    Estimated Creatinine Clearance: 84.2 mL/min (A) (by C-G formula based on SCr of 1.27 mg/dL (H)).   Medical History: Past Medical History:  Diagnosis Date   Brain aneurysm    Chicken pox    Depression    Elevated LFTs    Gout    Hernia of abdominal cavity 11/2020   High cholesterol    Hypertension     Medications:  Scheduled:   Chlorhexidine Gluconate Cloth  6 each Topical Q0600   enoxaparin (LOVENOX) injection  115 mg Subcutaneous Q12H   feeding supplement (PROSource TF20)  60 mL Per Tube TID   furosemide  40 mg Intravenous Q12H   gabapentin  100 mg Per Tube Q8H   insulin aspart  0-9 Units Subcutaneous Q4H   metoprolol tartrate  5 mg Intravenous Once   metoprolol tartrate  12.5 mg Per Tube BID   midodrine  5 mg Per Tube TID WC   mouth rinse  15 mL Mouth Rinse 4 times per day   pantoprazole (PROTONIX) IV  40 mg Intravenous QHS   sodium chloride flush  10-40 mL Intracatheter Q12H   Infusions:   amiodarone 60 mg/hr (11/01/23 0900)   feeding supplement (VITAL 1.5 CAL) 60 mL/hr at 11/01/23 0900   piperacillin-tazobactam (ZOSYN)  IV 12.5 mL/hr at 11/01/23 0900   potassium chloride 10 mEq (11/01/23 0947)   TPN ADULT (ION) 70 mL/hr at 11/01/23 0900   TPN ADULT (ION)      Assessment: 58 y.o. M well known to pharmacy from TPN dosing. Pt with new onset  afib - on amiodarone gtt. To begin full dose AC with Lovenox today. Hgb 8.1 yesterday. Has been on Lovenox 40mg  for DVT prophylaxis.  Goal of Therapy:  Anti-Xa level 0.6-1 units/ml 4hrs after LMWH dose given Monitor platelets by anticoagulation protocol: Yes   Plan:  Lovenox 115 mg SQ q12h Will f/u CBC q72h while on Lovenox and will f/u renal function  Christoper Fabian, PharmD, BCPS Please see amion for complete clinical pharmacist phone list 11/01/2023,9:59 AM

## 2023-11-01 NOTE — Progress Notes (Signed)
 PHARMACY - TOTAL PARENTERAL NUTRITION CONSULT NOTE   Indication: Prolonged ileus  Patient Measurements: Height: 6\' 1"  (185.4 cm) Weight: 115 kg (253 lb 8.5 oz) IBW/kg (Calculated) : 79.9 TPN AdjBW (KG): 86.6 Body mass index is 33.45 kg/m. Usual Weight: 120-130 kg   Assessment:  Patient admitted 2/14 with septic shock in the setting of bowel perforation. Patient has enterococcus bacteremia. TTE on 2/16 negative for vegetations. ID following. Due to prolonged ileus postoperatively, pharmacy consulted to start TPN on 2/18.   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   Glucose / Insulin: CBGs < 180. HbA1C 5.6% in 2020 (PTA empagliflozin for heart failure). 8u SSI in past 24h Electrolytes: waiting on electrolytes - pt is hard stick Renal: BUN 26, Scr 1.65 (baseline ~ 1.5)  Hepatic:  LFTs WNL, albumin<1.5;  TG 117 Intake / Output; MIVF: 2.6L UOP in last 24 hours (s/p lasix 2/19 + 2/20); 300 mL NGT output, LBM 2/20. Net +10L GI Imaging:  2/14 CT Ab/P - Substantial free intraperitoneal gas with ascites, exact site of perforation not well seen although colonic etiology favored given the diffuse colonic distension.  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 TPN + TF at 60 ml/hr providing > 100% of estimated needs. Plan to d/c TPN tomorrow if TF still tolerated at goal (per Dr. Denese Killings) Electrolytes in TPN: Na 30 mEq/L, increase K to 65 mEq/L, Ca 2 mEq/L, increase Mg to 7 mEq/L, and Phos 12 mmol/L. Cl: Ace 1: 1 Magnesium 2gm IV KCl IV x 4 and per tube Continue standard MVI and trace elements to TPN Continue Sensitive q4h SSI and adjust as needed   Christoper Fabian, PharmD, BCPS Please see amion for complete clinical pharmacist phone list 11/01/2023 9:41 AM

## 2023-11-01 NOTE — Progress Notes (Addendum)
 NAME:  Daniel Butler, MRN:  [id number], DOB:  03/26/1966, LOS: 8 ADMISSION DATE:  10/24/2023, CONSULTATION DATE:  10/24/2023 REFERRING MD:  Dr. Bedelia Person - CCS, CHIEF COMPLAINT:  Bowel perf   History of Present Illness:  Daniel Butler is a 58 y.o. with a past medical history significant for orthostatic hypotension, chronic diastolic congestive heart failure, persistent atrial fibrillation on Eliquis, CKD stage IIIa, anemia of chronic disease, severe alcohol use disorder, chronic ulcer of the left foot, prior SAH secondary to aneurysm s/p clipping, alcohol use, and depression who presented to the ED 2/14 from [facility] and rehab with complaints of abdominal pain and swelling with associated nausea and small-volume emesis that began day prior to admission.  States he has felt unwell x 3 days.  On ED arrival patient was seen febrile with temperature 103.4, tachypneic, and tachycardic.  Lab work significant for K2.4, glucose 116, creatinine 3.12, anion gap 26, alkaline phosphate 33, albumin 2.5, lactic 10.3, WBC 19.6.  CT abdomen and pelvis obtained which revealed substantial free air concerning for acute bowel perforation.  Surgery consulted and patient underwent exploratory laparotomy which revealed diffuse colonic distention with ischemia and gangrenous colonic necrosis with scattered areas of perforation and massive flocculent peritonitis.  Postprocedure patient remained intubated and sedated, PCCM consulted for further management admission.  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 OR for subtotal colectomy, intestinal discontinuity  2/15: con't on pressor support, ID consult  2/17: exploratory laparotomy, end ileostomy 2/18: goal to wake up and wean, will need TPN  2/19: waking, gets tachypneic, tachycardic with SBT 2/20: failed SBT. Starting trickle feeds, lasix BID, PO oxy and klonopin  2/21: extubated.  Interim History / Subjective:  Feeds are now at goal. Minimal abdominal pain. Extubated yesterday. Restarted amiodarone for RVR  Objective   Blood pressure 117/79, pulse (!) 111, temperature 99.7 F (37.6 C), resp. rate (!) 28, height 6\' 1"  (1.854 m), weight 115 kg, SpO2 100%.    SpO2: 100 % O2 Flow Rate (L/min): 4 L/min FiO2 (%): 30 %  Intake/Output Summary (Last 24 hours) at 11/01/2023 0925 Last data filed at 11/01/2023 0700 Gross per 24 hour  Intake 3631.52 ml  Output 2650 ml  Net 981.52 ml   Filed Weights   10/27/23 0500 10/31/23 0500 11/01/23 0500  Weight: 118.9 kg 113.6 kg 115 kg    Physical Exam: General: acute on chronically ill appearing male, laying in bed, calm HENT: NGT in place.  Respiratory: Clear bilaterally.   Cardiovascular:  atrial fibrillation with marginal rate control.  Heart sounds unremarkable.  Extremities cool. GI: soft, midline VAC dressing.  Ileostomy intact with normal stool output.  Extremities: Dependent edema. Neuro: awake and communicates appropriately, generally very weak.  GU: Foley catheter in place.  Clear urine.  Ancillary tests personally reviewed  K 3.2 and Mag 1.6 - repleted.   Assessment & Plan:  Septic shock in the  setting of small bowel perforation s/p ex lap subtotal colectomy and ileostomy  placement - resolved.  Enterococcus fecalis bacteremia  Atrial fibrillation likely 2/2 sepsis, critical illness  Acute hypoxic respiratory failure - now extubated.  Acute HFpEF with volume overload Protein calorie malnutrition Acute kidney injury on chronic kidney disease stage IIIa, improving  Plan:   -Incentive spirometry, wean oxygen.  - PT for mobilization as possible.  -Continue to diurese and follow renal function. -Manage pain with prn narcotic.  -Advance tube feeds progressively to goal and stop TPN once tolerating for at least 24 hours.  Swallow evaluation today.  -Complete 14 days of Zosyn.  Remove PICC and give patient line holiday once TPN is stopped. -Continue IV amiodarone. Consider increasing metoprolol as BP improves.  Best Practice (right click and "Reselect all SmartList Selections" daily)   Diet/type: tubefeeds, NPO, and TPN DVT prophylaxis Full anticoagulation for afib. Pressure ulcer(s): N/A GI prophylaxis: PPI Lines: Central line and yes and it is still needed Foley:  Yes, and it is still needed Code Status:  full code Last date of multidisciplinary goals of care discussion: updated wife at bedside 2/22  CRITICAL CARE Performed by: Lynnell Catalan   Total critical care time: 40 minutes  Critical care time was exclusive of separately billable procedures and treating other patients.  Critical care was necessary to treat or prevent imminent or life-threatening deterioration.  Critical care was time spent personally by me on the following activities: development of treatment plan with patient and/or surrogate as well as nursing, discussions with consultants, evaluation of patient's response to treatment, examination of patient, obtaining history from patient or surrogate, ordering and performing treatments and interventions, ordering and review of laboratory studies, ordering and review of radiographic studies, pulse oximetry, re-evaluation of patient's  condition and participation in multidisciplinary rounds.  Lynnell Catalan, MD [facility] ICU Physician [facility]  Pager: [phone] Mobile: [phone] After hours: [phone].

## 2023-11-01 NOTE — Evaluation (Signed)
 Clinical/Bedside Swallow Evaluation Patient Details  Name: Daniel Butler MRN: [id number] Date of Birth: [birth year]  Today's Date: 11/01/2023 Time: SLP Start Time (ACUTE ONLY): 1145 SLP Stop Time (ACUTE ONLY): 1205 SLP Time Calculation (min) (ACUTE ONLY): 20 min  Past Medical History:  Past Medical History:  Diagnosis Date   Brain aneurysm    Chicken pox    Depression    Elevated LFTs    Gout    Hernia of abdominal cavity 11/2020   High cholesterol    Hypertension    Past Surgical History:  Past Surgical History:  Procedure Laterality Date   BIOPSY  09/02/2023   Procedure: BIOPSY;  Surgeon: Jenel Lucks, MD;  Location: [facility] ENDOSCOPY;  Service: Gastroenterology;;   CARDIOVERSION N/A 01/22/2021   Procedure: CARDIOVERSION;  Surgeon: Meriam Sprague, MD;  Location: [facility] ENDOSCOPY;  Service: Cardiovascular;  Laterality: N/A;   COLON RESECTION N/A 10/24/2023   Procedure: COLON RESECTION;  Surgeon: Diamantina Monks, MD;  Location: [facility] OR;  Service: General;  Laterality: N/A;  wound vac applied   COLONOSCOPY N/A 09/02/2023   Procedure: COLONOSCOPY;  Surgeon: Jenel Lucks, MD;  Location: [facility] ENDOSCOPY;  Service: Gastroenterology;  Laterality: N/A;   CRANIOTOMY Right 03/02/2015   Procedure: CRANIOTOMY INTRACRANIAL  ANEURYSM FOR CLIPPING;  Surgeon: Lisbeth Renshaw, MD;  Location: [facility] NEURO ORS;  Service: Neurosurgery;  Laterality: Right;   ESOPHAGOGASTRODUODENOSCOPY N/A 09/02/2023   Procedure: ESOPHAGOGASTRODUODENOSCOPY (EGD);  Surgeon: Jenel Lucks, MD;  Location: [facility] ENDOSCOPY;  Service: Gastroenterology;  Laterality: N/A;   HEMOSTASIS CLIP PLACEMENT  09/02/2023   Procedure: HEMOSTASIS CLIP PLACEMENT;  Surgeon: Jenel Lucks, MD;  Location: [facility] ENDOSCOPY;  Service: Gastroenterology;;   ILEOSTOMY Right 10/27/2023   Procedure: ILEOSTOMY;  Surgeon: Abigail Miyamoto, MD;  Location: [facility] OR;  Service: General;  Laterality: Right;   IR GENERIC HISTORICAL  02/16/2016    IR ANGIO VERTEBRAL SEL VERTEBRAL BILAT MOD SED 02/16/2016 Lisbeth Renshaw, MD [facility]-INTERV RAD   IR GENERIC HISTORICAL  02/16/2016   IR ANGIO INTRA EXTRACRAN SEL INTERNAL CAROTID BILAT MOD SED 02/16/2016 Lisbeth Renshaw, MD [facility]-INTER[facility] RAD   LAPAROTOMY N/A 10/24/2023   Procedure: EXPLORATORY LAPAROTOMY;  Surgeon: Diamantina Monks, MD;  Location: [facility] OR;  Service: General;  Laterality: N/A;  wound vac applied   LAPAROTOMY N/A 10/27/2023   Procedure: RE-EXPLORATORY LAPAROTOMY WITH ABDOMINAL CLOSURE;  Surgeon: Abigail Miyamoto, MD;  Location: [facility] OR;  Service: General;  Laterality: N/A;  Removal of Wound Vac   POLYPECTOMY  09/02/2023   Procedure: POLYPECTOMY;  Surgeon: Jenel Lucks, MD;  Location: [facility] ENDOSCOPY;  Service: Gastroenterology;;   RADIOLOGY WITH ANESTHESIA N/A 03/02/2015   Procedure: RADIOLOGY WITH ANESTHESIA;  Surgeon: Lisbeth Renshaw, MD;  Location: [facility] OR;  Service: Radiology;  Laterality: N/A;   RIGHT/LEFT HEART CATH AND CORONARY ANGIOGRAPHY N/A 12/19/2020   Procedure: RIGHT/LEFT HEART CATH AND CORONARY ANGIOGRAPHY;  Surgeon: Swazil[place], Peter M, MD;  Location: [facility] INVASIVE CV LAB;  Service: Cardiovascular;  Laterality: N/A;   WISDOM TOOTH EXTRACTION     HPI:  Patient is a 58 y.o. male with PMH: orthostatic hypotension, chronic diastolic congestive heart failure, persistent atrial fibrillation on Eliquis, CKD stage IIIa, anemia of chronic disease, severe alcohol use disorder, chronic ulcer of the left foot, prior SAH secondary to aneurysm s/p clipping, depression. He presented to the ED at [facility] on 10/24/23 with complaints of abdominal pain and swelling with associated nausea and small volume emesis. In ED, he was febrile at 103.4, tachypneic,  tachycardic, CT abdomen and pelvis obtained which revealed substantial free concerning for acute bowel perforation.  Surgery consulted and patient underwent exploratory laparotomy on 2/17 which revealed diffuse colonic distention with ischemia and  gangrenous colonic necrosis with scattered areas of perforation and massive flocculent peritonitis.  He remained sedated and intubated s/p surgery and was extubated on 2/21. He is NPO with large bore NG for nutrition.    Assessment / Plan / Recommendation  Clinical Impression  Patient presents with clinical s/s of what is likely a post-extubation dysphagia as per this beside swallow evaluation. He was awake, alert, cooperative and spouse was in the room. Oral mucosa was moist and free of secretions. Patient initally with a low intensity, congested sounding voice however after cued to produce a strong cough, he was able to mobilize secretions into back of oral cavity which SLP then suctioned out. Following this, patient with a clear voice, mildly low in vocal instensity. RR and SpO2 were both WFL. SLP is recommending to continue with NPO status but to allow PRN ice chips, water sips when patient alert and after oral care. SLP also recommended that if spouse hears him with a congested sounding cough, to encourage him to cough hard to try to clear secretions out. SLP will follow for PO readiness. SLP Visit Diagnosis: Dysphagia, unspecified (R13.10)    Aspiration Risk  Mild aspiration risk    Diet Recommendation NPO;Ice chips PRN after oral care;Free water protocol after oral care    Liquid Administration via: Cup;Straw Medication Administration: Via alternative means Supervision: Full supervision/cueing for compensatory strategies Compensations: Slow rate;Small sips/bites Postural Changes: Seated upright at 90 degrees    Other  Recommendations Oral Care Recommendations: Oral care QID;Oral care prior to ice chip/H20;Staff/trained caregiver to provide oral care    Recommendations for follow up therapy are one component of a multi-disciplinary discharge planning process, led by the attending physician.  Recommendations may be updated based on patient status, additional functional criteria and insurance  authorization.  Follow up Recommendations Other (comment) (TBD)      Assistance Recommended at Discharge    Functional Status Assessment Patient has had a recent decline in their functional status and demonstrates the ability to make significant improvements in function in a reasonable and predictable amount of time.  Frequency and Duration min 2x/week  2 weeks       Prognosis Prognosis for improved oropharyngeal function: Good      Swallow Study   General Date of Onset: 10/24/23 HPI: Patient is a 58 y.o. male with PMH: orthostatic hypotension, chronic diastolic congestive heart failure, persistent atrial fibrillation on Eliquis, CKD stage IIIa, anemia of chronic disease, severe alcohol use disorder, chronic ulcer of the left foot, prior SAH secondary to aneurysm s/p clipping, depression. He presented to the ED at [facility] on 10/24/23 with complaints of abdominal pain and swelling with associated nausea and small volume emesis. In ED, he was febrile at 103.4, tachypneic, tachycardic, CT abdomen and pelvis obtained which revealed substantial free concerning for acute bowel perforation.  Surgery consulted and patient underwent exploratory laparotomy on 2/17 which revealed diffuse colonic distention with ischemia and gangrenous colonic necrosis with scattered areas of perforation and massive flocculent peritonitis.  He remained sedated and intubated s/p surgery and was extubated on 2/21. He is NPO with large bore NG for nutrition. Type of Study: Bedside Swallow Evaluation Previous Swallow Assessment: none found Diet Prior to this Study: NPO Temperature Spikes Noted: Yes (currently 99.5) Respiratory Status: Nasal cannula  History of Recent Intubation: Yes Total duration of intubation (days): 5 days Date extubated: 10/31/23 Behavior/Cognition: Alert;Cooperative;Pleasant mood Oral Cavity Assessment: Within Functional Limits Oral Care Completed by SLP: Yes Oral Cavity - Dentition:  Adequate natural dentition;Poor condition Self-Feeding Abilities: Needs assist;Needs set up;Total assist Patient Positioning: Upright in bed Baseline Vocal Quality: Low vocal intensity;Normal Volitional Cough: Congested;Strong Volitional Swallow: Able to elicit    Oral/Motor/Sensory Function Overall Oral Motor/Sensory Function: Within functional limits   Ice Chips Ice chips: Impaired Pharyngeal Phase Impairments: Suspected delayed Swallow   Thin Liquid Thin Liquid: Impaired Presentation: Straw;Cup;Spoon Oral Phase Impairments: Reduced labial seal Pharyngeal  Phase Impairments: Suspected delayed Swallow;Throat Clearing - Delayed    Nectar Thick     Honey Thick     Puree Puree: Not tested   Solid     Solid: Not tested     Angela Nevin, MA, CCC-SLP Speech Therapy

## 2023-11-01 NOTE — Progress Notes (Addendum)
 Patient ID: Daniel Butler, male   DOB: 08/25/1966, 58 y.o.   MRN: [id number] 5 Days Post-Op    Subjective: Working with therapies  ROS negative except as listed above. Objective: Vital signs in last 24 hours: Temp:  [99.5 F (37.5 C)-100.9 F (38.3 C)] 99.5 F (37.5 C) (02/22 0900) Pulse Rate:  [87-200] 99 (02/22 0900) Resp:  [14-31] 19 (02/22 0900) BP: (80-153)/(37-122) 105/58 (02/22 0900) SpO2:  [60 %-100 %] 100 % (02/22 0900) FiO2 (%):  [30 %] 30 % (02/21 1546) Weight:  [102 kg] 115 kg (02/22 0500) Last BM Date : 10/31/23  Intake/Output from previous day: 02/21 0701 - 02/22 0700 In: 3903.2 [I.V.:2430.5; NG/GT:1065; IV Piggyback:407.7] Out: 2650 [Urine:2250; Stool:400] Intake/Output this shift: Total I/O In: 392.2 [I.V.:206.1; NG/GT:120; IV Piggyback:66.1] Out: -   General appearance: alert and cooperative GI: soft, liquid stool from ostomy, ostomy pink, VAC midline  Lab Results: CBC  Recent Labs    10/30/23 0755  WBC 15.7*  HGB 8.1*  HCT 25.0*  PLT 243   BMET Recent Labs    10/31/23 1415 11/01/23 0612  NA 138 140  K 3.7 3.3*  CL 101 102  CO2 24 29  GLUCOSE 126* 148*  BUN 31* 37*  CREATININE 1.39* 1.27*  CALCIUM 7.6* 7.7*   PT/INR No results for input(s): "LABPROT", "INR" in the last 72 hours. ABG No results for input(s): "PHART", "HCO3" in the last 72 hours.  Invalid input(s): "PCO2", "PO2"  Studies/Results: No results found.  Anti-infectives: Anti-infectives (From admission, onward)    Start     Dose/Rate Route Frequency Ordered Stop   10/25/23 1400  piperacillin-tazobactam (ZOSYN) IVPB 3.375 g        3.375 g 12.5 mL/hr over 240 Minutes Intravenous Every 8 hours 10/25/23 0942 11/07/23 2359   10/25/23 0000  ceFEPIme (MAXIPIME) 2 g in sodium chloride 0.9 % 100 mL IVPB  Status:  Discontinued        2 g 200 mL/hr over 30 Minutes Intravenous Every 12 hours 10/24/23 1853 10/25/23 0942   10/24/23 2300  piperacillin-tazobactam (ZOSYN) IVPB  3.375 g  Status:  Discontinued        3.375 g 12.5 mL/hr over 240 Minutes Intravenous Every 8 hours 10/24/23 1509 10/24/23 1853   10/24/23 2300  metroNIDAZOLE (FLAGYL) IVPB 500 mg  Status:  Discontinued        500 mg 100 mL/hr over 60 Minutes Intravenous Every 12 hours 10/24/23 1838 10/25/23 0942   10/24/23 2000  micafungin (MYCAMINE) 100 mg in sodium chloride 0.9 % 100 mL IVPB  Status:  Discontinued        100 mg 105 mL/hr over 1 Hours Intravenous Every 24 hours 10/24/23 1838 10/29/23 1152   10/24/23 1945  vancomycin (VANCOREADY) IVPB 2000 mg/400 mL        2,000 mg 200 mL/hr over 120 Minutes Intravenous NOW 10/24/23 1854 10/24/23 2239   10/24/23 1854  vancomycin variable dose per unstable renal function (pharmacist dosing)  Status:  Discontinued         Does not apply See admin instructions 10/24/23 1854 10/25/23 0942   10/24/23 1245  ceFEPIme (MAXIPIME) 2 g in sodium chloride 0.9 % 100 mL IVPB        2 g 200 mL/hr over 30 Minutes Intravenous  Once 10/24/23 1236 10/24/23 1334   10/24/23 1245  metroNIDAZOLE (FLAGYL) IVPB 500 mg        500 mg 100 mL/hr over 60 Minutes Intravenous  Once  10/24/23 1236 10/24/23 1453       Assessment/Plan: s/p ex lap with subtotal abdominal colectomy in discontinuity, Dr. 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 Magnus Ivan 2/17 for pneumoperitoneum and gangrene of colon    -tolerating tube feeds at 60 cc/hr.   -continue wound VAC -Discussed at bedside with Dr. Denese Killings. TNA one more day. Continue therapies for deconditioning. Swallow eval. Replete K, Mg -OK to anticoagulate for a fib  We spoke with his wife and daughter.  LOS: 8 days    Violeta Gelinas, MD, MPH, FACS Trauma & General Surgery Use [email or web address] to contact on call provider  11/01/2023

## 2023-11-01 NOTE — Evaluation (Signed)
 Physical Therapy Evaluation Patient Details Name: Daniel Butler MRN: [id number] DOB: Mar 20, 1966 Today's Date: 11/01/2023  History of Present Illness  58 y.o. male admitted to [facility] on 10/24/23 from SNF forAbdominal pain, N/V, fever, found to have bowel perforation and underwent exp lap with colectomy and end ileostomy. Multiple abdominal surgeries over his first few days in the hospital (2/14, 2/15, 2/17).  He has a post op wound vac and ostomy bag as well as A-fib. Pt with significant PMH of brain aneurysm, gout, HTN.  Clinical Impression  Pt is weak and deconditioned after significant bowel surgery and intubation (extubated yesterday).  He was already weak at baseline and receiving therapy at SNF before this happened.  He is limited today by a-fib, but tolerated repositioning in chair mode well.  Next step will be to sit EOB and this will require two people to assist.  PT to follow acutely for deficits listed below.         If plan is discharge home, recommend the following: Two people to help with walking and/or transfers;Two people to help with bathing/dressing/bathroom   Can travel by Doctor, hospital (measurements PT);Wheelchair cushion (measurements PT);Hospital bed;Hoyer lift  Recommendations for Other Services       Functional Status Assessment Patient has had a recent decline in their functional status and demonstrates the ability to make significant improvements in function in a reasonable and predictable amount of time.     Precautions / Restrictions Precautions Precautions: Fall;Other (comment) Precaution/Restrictions Comments: abdominal wound vac          Pertinent Vitals/Pain Pain Assessment Pain Assessment: Faces Faces Pain Scale: Hurts even more Pain Descriptors / Indicators: Grimacing, Guarding Pain Intervention(s): Limited activity within patient's tolerance, Monitored during session, Repositioned, Patient  requesting pain meds-RN notified    Home Living Family/patient expects to be discharged to:: Skilled nursing facility                   Additional Comments: was from SNF getting rehab PTA    Prior Function Prior Level of Function : Needs assist       Physical Assist : Mobility (physical) Mobility (physical): Bed mobility;Transfers;Gait;Stairs   Mobility Comments: Pt was working on sitting EOB at the SNF.  It has been at least a month or two since he has been on his feet and walking.       Extremity/Trunk Assessment   Upper Extremity Assessment Upper Extremity Assessment: Defer to OT evaluation    Lower Extremity Assessment Lower Extremity Assessment: RLE deficits/detail;LLE deficits/detail RLE Deficits / Details: bil LE weak with visible muscle wasting and pitting edema, 2/5 gross strength from chair position in bed in bil ankles, knees, hips. RLE Sensation: WNL LLE Deficits / Details: bil LE weak with visible muscle wasting and pitting edema, 2/5 gross strength from chair position in bed in bil ankles, knees, hips. LLE Sensation: WNL    Cervical / Trunk Assessment Cervical / Trunk Assessment: Other exceptions Cervical / Trunk Exceptions: forward head, rounded shoulders, trunk weakness  Communication   Communication Communication: No apparent difficulties    Cognition Arousal: Alert Behavior During Therapy: WFL for tasks assessed/performed                           PT - Cognition Comments: Pt was oriented for tasks assessed, even knew the date Following commands: Intact  Cueing       General Comments General comments (skin integrity, edema, etc.): Pt positioned in chair mode to perform bil UE and LE exercises, HR was 122 at rest and did not want to stress the system too much with EOB mobility today.    Exercises General Exercises - Upper Extremity Shoulder Flexion: AAROM, Both, 10 reps (to 90 degrees on L with PICC) Elbow Flexion:  AAROM, Both, 10 reps Elbow Extension: AAROM, Both, 10 reps Digit Composite Flexion: AAROM, Both, 10 reps General Exercises - Lower Extremity Ankle Circles/Pumps: AAROM, Both, 10 reps, Seated Short Arc Quad: AAROM, Both, 10 reps, Seated   Assessment/Plan    PT Assessment Patient needs continued PT services  PT Problem List Decreased strength;Decreased activity tolerance;Decreased balance;Decreased mobility;Decreased knowledge of use of DME;Decreased knowledge of precautions;Cardiopulmonary status limiting activity;Pain       PT Treatment Interventions DME instruction;Gait training;Functional mobility training;Therapeutic activities;Therapeutic exercise;Balance training;Patient/family education;Wheelchair mobility training    PT Goals (Current goals can be found in the Care Plan section)  Acute Rehab PT Goals Patient Stated Goal: to get back to gardening again, standing and walking per wife PT Goal Formulation: With patient/family Time For Goal Achievement: 11/15/23 Potential to Achieve Goals: Good    Frequency Min 1X/week     Co-evaluation               AM-PAC PT "6 Clicks" Mobility  Outcome Measure Help needed turning from your back to your side while in a flat bed without using bedrails?: Total Help needed moving from lying on your back to sitting on the side of a flat bed without using bedrails?: Total Help needed moving to and from a bed to a chair (including a wheelchair)?: Total Help needed standing up from a chair using your arms (e.g., wheelchair or bedside chair)?: Total Help needed to walk in hospital room?: Total Help needed climbing 3-5 steps with a railing? : Total 6 Click Score: 6    End of Session   Activity Tolerance: Patient limited by pain;Patient limited by fatigue Patient left: in bed;with call bell/phone within reach Nurse Communication: Mobility status;Other (comment) (asked him to stay up in chair mode until RN gets back from MRI) PT Visit  Diagnosis: Muscle weakness (generalized) (M62.81);Difficulty in walking, not elsewhere classified (R26.2);Pain Pain - Right/Left:  (abdomen) Pain - part of body:  (abdomen)    Time: 1610-9604 PT Time Calculation (min) (ACUTE ONLY): 47 min   Charges:   PT Evaluation $PT Eval Moderate Complexity: 1 Mod PT Treatments $Therapeutic Exercise: 8-22 mins PT General Charges $$ ACUTE PT VISIT: 1 Visit       Corinna Capra, PT, DPT  Acute Rehabilitation Secure chat is best for contact #[phone] office    11/01/2023, 6:19 PM

## 2023-11-02 ENCOUNTER — Inpatient Hospital Stay (HOSPITAL_COMMUNITY): Payer: Medicaid Other

## 2023-11-02 DIAGNOSIS — E46 Unspecified protein-calorie malnutrition: Secondary | ICD-10-CM | POA: Diagnosis not present

## 2023-11-02 DIAGNOSIS — A419 Sepsis, unspecified organism: Secondary | ICD-10-CM | POA: Diagnosis not present

## 2023-11-02 DIAGNOSIS — J9601 Acute respiratory failure with hypoxia: Secondary | ICD-10-CM | POA: Diagnosis not present

## 2023-11-02 DIAGNOSIS — R6521 Severe sepsis with septic shock: Secondary | ICD-10-CM | POA: Diagnosis not present

## 2023-11-02 LAB — BASIC METABOLIC PANEL
Anion gap: 12 (ref 5–15)
Anion gap: 11 (ref 5–15)
BUN: 39 mg/dL — ABNORMAL HIGH (ref 6–20)
BUN: 41 mg/dL — ABNORMAL HIGH (ref 6–20)
CO2: 27 mmol/L (ref 22–32)
CO2: 26 mmol/L (ref 22–32)
Calcium: 7.3 mg/dL — ABNORMAL LOW (ref 8.9–10.3)
Calcium: 7.7 mg/dL — ABNORMAL LOW (ref 8.9–10.3)
Chloride: 96 mmol/L — ABNORMAL LOW (ref 98–111)
Chloride: 102 mmol/L (ref 98–111)
Creatinine, Ser: 1.12 mg/dL (ref 0.61–1.24)
Creatinine, Ser: 1.11 mg/dL (ref 0.61–1.24)
GFR, Estimated: >60 mL/min (ref >60)
GFR, Estimated: >60 mL/min (ref >60)
Glucose, Bld: 352 mg/dL — ABNORMAL HIGH (ref 70–99)
Glucose, Bld: 140 mg/dL — ABNORMAL HIGH (ref 70–99)
Potassium: 3.1 mmol/L — ABNORMAL LOW (ref 3.5–5.1)
Potassium: 3.6 mmol/L (ref 3.5–5.1)
Sodium: 135 mmol/L (ref 135–145)
Sodium: 139 mmol/L (ref 135–145)

## 2023-11-02 LAB — PHOSPHORUS
Phosphorus: <1 mg/dL — CL (ref 2.5–4.6)
Phosphorus: 1.2 mg/dL — ABNORMAL LOW (ref 2.5–4.6)
Phosphorus: 3.1 mg/dL (ref 2.5–4.6)

## 2023-11-02 LAB — CBC
HCT: 26.1 % — ABNORMAL LOW (ref 39.0–52.0)
Hemoglobin: 7.7 g/dL — ABNORMAL LOW (ref 13.0–17.0)
MCH: 25.8 pg — ABNORMAL LOW (ref 26.0–34.0)
MCHC: 29.5 g/dL — ABNORMAL LOW (ref 30.0–36.0)
MCV: 87.6 fL (ref 80.0–100.0)
Platelets: 280 10*3/uL (ref 150–400)
RBC: 2.98 MIL/uL — ABNORMAL LOW (ref 4.22–5.81)
RDW: 20.6 % — ABNORMAL HIGH (ref 11.5–15.5)
WBC: 22.4 10*3/uL — ABNORMAL HIGH (ref 4.0–10.5)
nRBC: 0.3 % — ABNORMAL HIGH (ref 0.0–0.2)

## 2023-11-02 LAB — GLUCOSE, CAPILLARY
Glucose-Capillary: 158 mg/dL — ABNORMAL HIGH (ref 70–99)
Glucose-Capillary: 140 mg/dL — ABNORMAL HIGH (ref 70–99)
Glucose-Capillary: 130 mg/dL — ABNORMAL HIGH (ref 70–99)
Glucose-Capillary: 148 mg/dL — ABNORMAL HIGH (ref 70–99)
Glucose-Capillary: 135 mg/dL — ABNORMAL HIGH (ref 70–99)
Glucose-Capillary: 131 mg/dL — ABNORMAL HIGH (ref 70–99)

## 2023-11-02 LAB — TRIGLYCERIDES: Triglycerides: 175 mg/dL — ABNORMAL HIGH (ref <150)

## 2023-11-02 LAB — TSH: TSH: 1.569 u[IU]/mL (ref 0.350–4.500)

## 2023-11-02 LAB — MAGNESIUM
Magnesium: 1.6 mg/dL — ABNORMAL LOW (ref 1.7–2.4)
Magnesium: 1.7 mg/dL (ref 1.7–2.4)

## 2023-11-02 MED ORDER — CLONAZEPAM 0.1 MG/ML ORAL SUSPENSION: 1.0000 mg | Freq: Two times a day (BID) | ORAL | Status: DC

## 2023-11-02 MED ORDER — CLONAZEPAM 0.5 MG PO TBDP
1.0000 mg | ORAL_TABLET | Freq: Two times a day (BID) | ORAL | Status: DC
  Filled 2023-11-02 (×2): qty 2

## 2023-11-02 MED ORDER — MIDODRINE HCL 5 MG PO TABS
10.0000 mg | ORAL_TABLET | Freq: Three times a day (TID) | ORAL | Status: DC
  Administered 2023-11-02 – 2023-11-05 (×10): 10 mg
  Filled 2023-11-02 (×11): qty 2

## 2023-11-02 MED ORDER — DEXMEDETOMIDINE HCL IN NACL 400 MCG/100ML IV SOLN
0.0000 ug/kg/h | INTRAVENOUS | Status: DC
  Administered 2023-11-02: 0.4 ug/kg/h via INTRAVENOUS
  Administered 2023-11-02 – 2023-11-03 (×2): 0.7 ug/kg/h via INTRAVENOUS
  Filled 2023-11-02: qty 100
  Filled 2023-11-02: qty 200

## 2023-11-02 MED ORDER — METOPROLOL TARTRATE 25 MG PO TABS
25.0000 mg | ORAL_TABLET | Freq: Two times a day (BID) | ORAL | Status: AC
  Administered 2023-11-02: 25 mg
  Filled 2023-11-02: qty 1

## 2023-11-02 MED ORDER — LORAZEPAM 2 MG/ML IJ SOLN
2.0000 mg | Freq: Once | INTRAMUSCULAR | Status: AC
  Administered 2023-11-02: 2 mg via INTRAVENOUS
  Filled 2023-11-02: qty 1

## 2023-11-02 MED ORDER — POTASSIUM PHOSPHATES 15 MMOLE/5ML IV SOLN
45.0000 mmol | Freq: Once | INTRAVENOUS | Status: DC
  Filled 2023-11-02: qty 15

## 2023-11-02 MED ORDER — MAGNESIUM SULFATE 2 GM/50ML IV SOLN
2.0000 g | Freq: Once | INTRAVENOUS | Status: AC
  Administered 2023-11-02: 2 g via INTRAVENOUS
  Filled 2023-11-02: qty 50

## 2023-11-02 MED ORDER — DILTIAZEM HCL-DEXTROSE 125-5 MG/125ML-% IV SOLN (PREMIX)
5.0000 mg/h | INTRAVENOUS | Status: DC
  Administered 2023-11-02: 5 mg/h via INTRAVENOUS
  Filled 2023-11-02: qty 125

## 2023-11-02 MED ORDER — MAGNESIUM SULFATE 4 GM/100ML IV SOLN
4.0000 g | Freq: Once | INTRAVENOUS | Status: DC
  Filled 2023-11-02: qty 100

## 2023-11-02 MED ORDER — ADULT MULTIVITAMIN W/MINERALS CH
1.0000 | ORAL_TABLET | Freq: Every day | ORAL | Status: DC
  Administered 2023-11-03 – 2023-11-08 (×5): 1
  Filled 2023-11-02 (×6): qty 1

## 2023-11-02 MED ORDER — POTASSIUM PHOSPHATES 15 MMOLE/5ML IV SOLN
45.0000 mmol | Freq: Once | INTRAVENOUS | Status: AC
  Administered 2023-11-02: 45 mmol via INTRAVENOUS
  Filled 2023-11-02: qty 15

## 2023-11-02 MED ORDER — QUETIAPINE FUMARATE 25 MG PO TABS
25.0000 mg | ORAL_TABLET | Freq: Two times a day (BID) | ORAL | Status: DC
  Administered 2023-11-02 – 2023-11-08 (×11): 25 mg
  Filled 2023-11-02 (×12): qty 1

## 2023-11-02 MED ORDER — DILTIAZEM HCL 60 MG PO TABS
60.0000 mg | ORAL_TABLET | Freq: Three times a day (TID) | ORAL | Status: DC
  Administered 2023-11-02 – 2023-11-05 (×8): 60 mg
  Filled 2023-11-02 (×12): qty 1

## 2023-11-02 MED ORDER — DILTIAZEM LOAD VIA INFUSION
10.0000 mg | Freq: Once | INTRAVENOUS | Status: DC
  Filled 2023-11-02: qty 10

## 2023-11-02 MED ORDER — DILTIAZEM HCL-DEXTROSE 125-5 MG/125ML-% IV SOLN (PREMIX)
5.0000 mg/h | INTRAVENOUS | Status: DC
  Filled 2023-11-02: qty 125

## 2023-11-02 MED ORDER — POTASSIUM CHLORIDE 20 MEQ PO PACK
20.0000 meq | PACK | Freq: Once | ORAL | Status: DC
  Filled 2023-11-02: qty 1

## 2023-11-02 MED ORDER — DILTIAZEM HCL 60 MG PO TABS: 60.0000 mg | ORAL_TABLET | Freq: Three times a day (TID) | ORAL | Status: DC

## 2023-11-02 NOTE — Progress Notes (Signed)
 Patient ID: Daniel Butler, male   DOB: 07-31-1966, 59 y.o.   MRN: [id number] 6 Days Post-Op    Subjective: A bit anxious, has AF RVR OVN ROS negative except as listed above. Objective: Vital signs in last 24 hours: Temp:  [99.3 F (37.4 C)-100.8 F (38.2 C)] 100.6 F (38.1 C) (02/23 0700) Pulse Rate:  [75-144] 100 (02/23 0700) Resp:  [16-28] 24 (02/23 0700) BP: (89-152)/(55-123) 109/67 (02/23 0700) SpO2:  [97 %-100 %] 100 % (02/23 0700) Weight:  [116.5 kg] 116.5 kg (02/23 0500) Last BM Date : 11/01/23  Intake/Output from previous day: 02/22 0701 - 02/23 0700 In: 4312.5 [I.V.:2482.3; NG/GT:1440; IV Piggyback:390.2] Out: 3720 [Urine:3170; Drains:75; Stool:475] Intake/Output this shift: No intake/output data recorded.  General appearance: alert GI: soft, VAC midline, ostomy pink with good output  Lab Results: CBC  Recent Labs    11/02/23 0453  WBC 22.4*  HGB 7.7*  HCT 26.1*  PLT 280   BMET Recent Labs    10/31/23 1415 11/01/23 0612  NA 138 140  K 3.7 3.3*  CL 101 102  CO2 24 29  GLUCOSE 126* 148*  BUN 31* 37*  CREATININE 1.39* 1.27*  CALCIUM 7.6* 7.7*   PT/INR No results for input(s): "LABPROT", "INR" in the last 72 hours. ABG No results for input(s): "PHART", "HCO3" in the last 72 hours.  Invalid input(s): "PCO2", "PO2"  Studies/Results: No results found.  Anti-infectives: Anti-infectives (From admission, onward)    Start     Dose/Rate Route Frequency Ordered Stop   10/25/23 1400  piperacillin-tazobactam (ZOSYN) IVPB 3.375 g        3.375 g 12.5 mL/hr over 240 Minutes Intravenous Every 8 hours 10/25/23 0942 11/07/23 2359   10/25/23 0000  ceFEPIme (MAXIPIME) 2 g in sodium chloride 0.9 % 100 mL IVPB  Status:  Discontinued        2 g 200 mL/hr over 30 Minutes Intravenous Every 12 hours 10/24/23 1853 10/25/23 0942   10/24/23 2300  piperacillin-tazobactam (ZOSYN) IVPB 3.375 g  Status:  Discontinued        3.375 g 12.5 mL/hr over 240 Minutes  Intravenous Every 8 hours 10/24/23 1509 10/24/23 1853   10/24/23 2300  metroNIDAZOLE (FLAGYL) IVPB 500 mg  Status:  Discontinued        500 mg 100 mL/hr over 60 Minutes Intravenous Every 12 hours 10/24/23 1838 10/25/23 0942   10/24/23 2000  micafungin (MYCAMINE) 100 mg in sodium chloride 0.9 % 100 mL IVPB  Status:  Discontinued        100 mg 105 mL/hr over 1 Hours Intravenous Every 24 hours 10/24/23 1838 10/29/23 1152   10/24/23 1945  vancomycin (VANCOREADY) IVPB 2000 mg/400 mL        2,000 mg 200 mL/hr over 120 Minutes Intravenous NOW 10/24/23 1854 10/24/23 2239   10/24/23 1854  vancomycin variable dose per unstable renal function (pharmacist dosing)  Status:  Discontinued         Does not apply See admin instructions 10/24/23 1854 10/25/23 0942   10/24/23 1245  ceFEPIme (MAXIPIME) 2 g in sodium chloride 0.9 % 100 mL IVPB        2 g 200 mL/hr over 30 Minutes Intravenous  Once 10/24/23 1236 10/24/23 1334   10/24/23 1245  metroNIDAZOLE (FLAGYL) IVPB 500 mg        500 mg 100 mL/hr over 60 Minutes Intravenous  Once 10/24/23 1236 10/24/23 1453       Assessment/Plan: s/p ex lap with  subtotal abdominal colectomy in discontinuity, Dr. 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 Magnus Ivan 2/17 for pneumoperitoneum and gangrene of colon      -tolerating tube feeds at 60 cc/hr. TNA to come off -continue wound VAC -Discussed on the unit with Dr. Denese Killings -Continue therapies for deconditioning -OK to anticoagulate for a fib   LOS: 9 days    Violeta Gelinas, MD, MPH, FACS Trauma & General Surgery Use [email or web address] to contact on call provider  11/02/2023

## 2023-11-02 NOTE — Progress Notes (Signed)
 [facility] ADULT ICU REPLACEMENT PROTOCOL FOR AM LAB REPLACEMENT ONLY  The patient does apply for the [facility] Adult ICU Electrolyte Replacment Protocol based on the criteria listed below:   1. Is GFR >/= 40 ml/min? Yes.    Patient's GFR today is 97 2. Is urine output >/= 0.5 ml/kg/hr for the last 6 hours? Yes.   Patient's UOP is 1.1 ml/kg/hr 3. Is BUN < 60 mg/dL? Yes.    Patient's BUN today is 41 4. Abnormal electrolyte(s): Phos 1.2, Mg 1.7, K 3.6 5. Ordered repletion with: KPhos 45 mmol x 1, Mg 2gm IV 6. If a panic level lab has been reported, has the CCM MD in charge been notified? Yes.  .   Physician:  Dr. Wilder Glade, PharmD, BCPS Please see amion for complete clinical pharmacist phone list 11/02/2023 10:56 AM

## 2023-11-02 NOTE — Progress Notes (Signed)
 Speech Language Pathology Treatment: Dysphagia  Patient Details Name: LOGYN KENDRICK MRN: [id number] DOB: 01-May-1966 Today's Date: 11/02/2023 Time: 1005-1020 SLP Time Calculation (min) (ACUTE ONLY): 15 min  Assessment / Plan / Recommendation Clinical Impression  Patient seen by SLP for skilled treatment focused on dysphagia goals/PO trials. RN spoke with SLP prior to session and she reported that his HR has been elevated and he is more confused today but that he has been tolerating ice chips/water sips well. Patient was indeed more confused, frequently telling SLP and/or spouse that he was "ready to go" and that he was going to move to a different room. His spouse spoke calmly to him to redirect and he did not become noticeably agitated. Of note, overall voice appears improved with patient able to achieve consistent voicing at phrase level. After oral care, he accepted small amount of ice chips and spoon sips of water. He did have a productive cough after initial trial of ice chips and SLP able to suction out from oral cavity. Although he is confused, he did exhibit instances of awareness, such as telling SLP, "I have to get it to the back of my tongue" (referring to mobilization of secretions). After coughing, he did not want to try anymore but he was easily redirected to try some more in order to keep working on his swallow function. SLP is recommending continue NPO status, continue with PRN ice chips/water sips. Plan will be for objective swallow study (MBS) when he is more stable with HR.   He currently has an NG and would benefit from changing to Cortrak feeding tube when appropriate.   HPI HPI: Patient is a 58 y.o. male with PMH: orthostatic hypotension, chronic diastolic congestive heart failure, persistent atrial fibrillation on Eliquis, CKD stage IIIa, anemia of chronic disease, severe alcohol use disorder, chronic ulcer of the left foot, prior SAH secondary to aneurysm s/p clipping,  depression. He presented to the ED at [facility] on 10/24/23 with complaints of abdominal pain and swelling with associated nausea and small volume emesis. In ED, he was febrile at 103.4, tachypneic, tachycardic, CT abdomen and pelvis obtained which revealed substantial free concerning for acute bowel perforation.  Surgery consulted and patient underwent exploratory laparotomy on 2/17 which revealed diffuse colonic distention with ischemia and gangrenous colonic necrosis with scattered areas of perforation and massive flocculent peritonitis.  He remained sedated and intubated s/p surgery and was extubated on 2/21. He is NPO with large bore NG for nutrition.      SLP Plan  Continue with current plan of care      Recommendations for follow up therapy are one component of a multi-disciplinary discharge planning process, led by the attending physician.  Recommendations may be updated based on patient status, additional functional criteria and insurance authorization.    Recommendations  Diet recommendations: NPO;Other(comment) (PRN ice, water) Liquids provided via: Cup;Straw;Teaspoon Medication Administration: Via alternative means Supervision: Full supervision/cueing for compensatory strategies;Staff to assist with self feeding Compensations: Slow rate;Small sips/bites Postural Changes and/or Swallow Maneuvers: Seated upright 90 degrees                  Oral care QID;Oral care prior to ice chip/H20;Staff/trained caregiver to provide oral care   Frequent or constant Supervision/Assistance Dysphagia, unspecified (R13.10)     Continue with current plan of care    Angela Nevin, MA, CCC-SLP Speech Therapy

## 2023-11-02 NOTE — Progress Notes (Signed)
 eLink Physician-Brief Progress Note Patient Name: HAYS DUNNIGAN DOB: 1966-08-18 MRN: [id number]   Date of Service  11/02/2023  HPI/Events of Note  Very sedated. Going down on precedex. Ok to hold klonopin? Does have it PRN if needed for later.  BP 91/54 (66) No parameters on the cardizem Po 60mg  q8. Want to hold? ok to give?   Camera: On 4 lit nasal o2.  Sats 100%. On amiodarone drip. Cardizem via NG tube.     eICU Interventions  Hold cardizem  for tonight Hold klonopin. VBG stat.  Aspiration precautions.      Intervention Category Intermediate Interventions: Arrhythmia - evaluation and management;Other:  Ranee Gosselin 11/02/2023, 9:52 PM  06:26 Temp 102.6. Had tylenol and ice packs. MAP ok. Not on pressors.  Request for cooling blanket. Ordered.  On zosyn. ID on board.  S/P Ex lap; subtotal abdominal colectomy, removal of necrotic bowel, wound VAC application.  Massive feculent peritonitis.   ID to follow up . Follow AM CBC.

## 2023-11-02 NOTE — Progress Notes (Signed)
 eLink Physician-Brief Progress Note Patient Name: Daniel Butler DOB: 1966-04-09 MRN: [id number]   Date of Service  11/02/2023  HPI/Events of Note  HR remains very elevated.   Bblocker and amiodarone not controlling  eICU Interventions  Stop bblocker Begin diltiazem drip Watch HR closely Goal for now less than 130     Intervention Category Intermediate Interventions: Arrhythmia - evaluation and management  Henry Russel, P 11/02/2023, 5:57 AM

## 2023-11-02 NOTE — Progress Notes (Addendum)
 Patient had an episode in which NG tube was pulled out, but quickly TF was paused and the NG tube was replaced, and confirmed placement with an ABD xray.   Dr. Denese Killings and Dr. Janee Morn notified at 1700 of patient beginning to grow increasingly agitated, delirious, and impulsive. New orders to start Precedex gtt, administer 2mg  of Ativan, and per tube Klonopin to be administered.   Mammie Russian, RN

## 2023-11-02 NOTE — Progress Notes (Signed)
 Pt with Afib RVR. Pt with sustained tachycardia despite scheduled and PRN metoprolol doses as well as amiodarone drip. Contacted e-link. Spoke with Katrinka Blazing, MD. One time STAT order for Metoprolol ordered and given.   Pt continues to experience tachycardia despite medications given. Spoke with Katrinka Blazing, MD once more. See new orders. This RN will continue to monitor and assess the pt and notify of any changes.

## 2023-11-02 NOTE — Progress Notes (Signed)
 NAME:  Daniel Butler, MRN:  [id number], DOB:  13-Jul-1966, LOS: 9 ADMISSION DATE:  10/24/2023, CONSULTATION DATE:  10/24/2023 REFERRING MD:  Dr. Bedelia Person - CCS, CHIEF COMPLAINT:  Bowel perf   History of Present Illness:  Daniel Butler is a 58 y.o. with a past medical history significant for orthostatic hypotension, chronic diastolic congestive heart failure, persistent atrial fibrillation on Eliquis, CKD stage IIIa, anemia of chronic disease, severe alcohol use disorder, chronic ulcer of the left foot, prior SAH secondary to aneurysm s/p clipping, alcohol use, and depression who presented to the ED 2/14 from [facility] and rehab with complaints of abdominal pain and swelling with associated nausea and small-volume emesis that began day prior to admission.  States he has felt unwell x 3 days.  On ED arrival patient was seen febrile with temperature 103.4, tachypneic, and tachycardic.  Lab work significant for K2.4, glucose 116, creatinine 3.12, anion gap 26, alkaline phosphate 33, albumin 2.5, lactic 10.3, WBC 19.6.  CT abdomen and pelvis obtained which revealed substantial free air concerning for acute bowel perforation.  Surgery consulted and patient underwent exploratory laparotomy which revealed diffuse colonic distention with ischemia and gangrenous colonic necrosis with scattered areas of perforation and massive flocculent peritonitis.  Postprocedure patient remained intubated and sedated, PCCM consulted for further management admission.  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 OR for subtotal colectomy, intestinal discontinuity  2/15: con't on pressor support, ID consult  2/17: exploratory laparotomy, end ileostomy 2/18: goal to wake up and wean, will need TPN  2/19: waking, gets tachypneic, tachycardic with SBT 2/20: failed SBT. Starting trickle feeds, lasix BID, PO oxy and klonopin  2/21: extubated.  Interim History / Subjective:  Tolerating feeds at goal. Minimal abdominal pain. Atrial fibrillation remains difficult to control.   Objective   Blood pressure 98/62, pulse (!) 102, temperature (!) 100.6 F (38.1 C), resp. rate (!) 26, height 6\' 1"  (1.854 m), weight 116.5 kg, SpO2 99%.    SpO2: 99 % O2 Flow Rate (L/min): 4 L/min FiO2 (%): 30 %   Intake/Output Summary (Last 24 hours) at 11/02/2023 1129 Last data filed at 11/02/2023 1000 Gross per 24 hour  Intake 4213.88 ml  Output 3800 ml  Net 413.88 ml   Filed Weights   10/31/23 0500 11/01/23 0500 11/02/23 0500  Weight: 113.6 kg 115 kg 116.5 kg    Physical Exam: General: acute on chronically ill appearing male, sitting in bed.  HENT: NGT in place. Speech still weak.  Respiratory: Clear bilaterally.  No wheezing.  Cardiovascular:  atrial fibrillation with marginal rate control.  Heart sounds unremarkable.  Extremities cool. GI: soft, midline VAC dressing. Minimal drainage. Ileostomy intact with normal stool output.  Extremities: Dependent edema much improved, now trace.  Neuro: awake and communicates appropriately, generally very weak.  GU: Foley catheter in place.  Clear urine.  Ancillary tests personally reviewed  Normal electrolytes and Creatinine  1.11 WBC 22.4 yesterday.   Assessment &  Plan:  Septic shock in the setting of small bowel perforation s/p ex lap subtotal colectomy and ileostomy placement - resolved.  Enterococcus fecalis bacteremia  Atrial fibrillation likely 2/2 sepsis, critical illness  Acute hypoxic respiratory failure - now extubated.  Acute HFpEF with volume overload - now euvolemic. Protein calorie malnutrition Acute kidney injury on chronic kidney disease stage IIIa, improving  Plan:   -Would be ready for PCU if Afib was controlled. Will switch diltiazem to oral and drop IV amiodarone dose.  - Increase midodrine to preadmission dose. - Hold further diuresis. - Stop TPN today. Continue tube feeds, oral feeds for pleasure.  - Mobilize as tolerated, has not been mobile for some time.  - Pull PICC line tomorrow. Complete 14 days of Zosyn.  Best Practice (right click and "Reselect all SmartList Selections" daily)   Diet/type: tubefeeds, NPO, and TPN DVT prophylaxis Full anticoagulation for afib. Pressure ulcer(s): N/A GI prophylaxis: PPI Lines: Central line and yes and it is still needed Foley:  Yes, and it is still needed Code Status:  full code Last date of multidisciplinary goals of care discussion: updated wife at bedside 2/22  CRITICAL CARE Performed by: Lynnell Catalan   Total critical care time: 34 minutes  Critical care time was exclusive of separately billable procedures and treating other patients.  Critical care was necessary to treat or prevent imminent or life-threatening deterioration.  Critical care was time spent personally by me on the following activities: development of treatment plan with patient and/or surrogate as well as nursing, discussions with consultants, evaluation of patient's response to treatment, examination of patient, obtaining history from patient or surrogate, ordering and performing treatments and interventions, ordering and review of laboratory studies, ordering and review of radiographic studies, pulse  oximetry, re-evaluation of patient's condition and participation in multidisciplinary rounds.  Lynnell Catalan, MD [facility] ICU Physician [facility]  Pager: [phone] Mobile: [phone] After hours: [phone].

## 2023-11-02 NOTE — Progress Notes (Addendum)
 Date and time results received: 11/02/23 9:43 AM  Test: Phosphorus Critical Value: <1.0 (Repeat phosphorus 1.2)  Name of Provider Notified: Dr. Denese Killings and Christoper Fabian, [facility].  Orders Received? Or Actions Taken?: Redraw peripherally by phlebotomy to confirm sample. Despite pausing TPN and flushing lumens, concerns for TPN contaminating sample. Repeat labs revealed about the same values -- new orders to replace electrolytes.   Mammie Russian, RN

## 2023-11-02 NOTE — Progress Notes (Signed)
 eLink Physician-Brief Progress Note Patient Name: Daniel Butler DOB: 05-20-1966 MRN: [id number]   Date of Service  11/02/2023  HPI/Events of Note  Afib with rvr despite amiodarone drip and metoprolol.  Afib is chronic and was on bblocker and calcium channel blocker as outpatient.  eICU Interventions  Increase betablocker dose now     Intervention Category Intermediate Interventions: Arrhythmia - evaluation and management  Henry Russel, P 11/02/2023, 2:54 AM

## 2023-11-03 ENCOUNTER — Inpatient Hospital Stay (HOSPITAL_COMMUNITY): Payer: Medicaid Other

## 2023-11-03 DIAGNOSIS — I5031 Acute diastolic (congestive) heart failure: Secondary | ICD-10-CM | POA: Diagnosis not present

## 2023-11-03 DIAGNOSIS — J9601 Acute respiratory failure with hypoxia: Secondary | ICD-10-CM | POA: Diagnosis not present

## 2023-11-03 DIAGNOSIS — A419 Sepsis, unspecified organism: Secondary | ICD-10-CM | POA: Diagnosis not present

## 2023-11-03 DIAGNOSIS — R6521 Severe sepsis with septic shock: Secondary | ICD-10-CM | POA: Diagnosis not present

## 2023-11-03 LAB — GLUCOSE, CAPILLARY
Glucose-Capillary: 107 mg/dL — ABNORMAL HIGH (ref 70–99)
Glucose-Capillary: 117 mg/dL — ABNORMAL HIGH (ref 70–99)
Glucose-Capillary: 119 mg/dL — ABNORMAL HIGH (ref 70–99)
Glucose-Capillary: 114 mg/dL — ABNORMAL HIGH (ref 70–99)
Glucose-Capillary: 106 mg/dL — ABNORMAL HIGH (ref 70–99)
Glucose-Capillary: 141 mg/dL — ABNORMAL HIGH (ref 70–99)

## 2023-11-03 LAB — PHOSPHORUS: Phosphorus: 2.7 mg/dL (ref 2.5–4.6)

## 2023-11-03 LAB — CBC WITH DIFFERENTIAL/PLATELET
Abs Immature Granulocytes: 0.85 10*3/uL — ABNORMAL HIGH (ref 0.00–0.07)
Basophils Absolute: 0 10*3/uL (ref 0.0–0.1)
Basophils Relative: 0 %
Eosinophils Absolute: 0 10*3/uL (ref 0.0–0.5)
Eosinophils Relative: 0 %
HCT: 26.7 % — ABNORMAL LOW (ref 39.0–52.0)
Hemoglobin: 8.1 g/dL — ABNORMAL LOW (ref 13.0–17.0)
Immature Granulocytes: 4 %
Lymphocytes Relative: 3 %
Lymphs Abs: 0.6 10*3/uL — ABNORMAL LOW (ref 0.7–4.0)
MCH: 26.3 pg (ref 26.0–34.0)
MCHC: 30.3 g/dL (ref 30.0–36.0)
MCV: 86.7 fL (ref 80.0–100.0)
Monocytes Absolute: 1.1 10*3/uL — ABNORMAL HIGH (ref 0.1–1.0)
Monocytes Relative: 5 %
Neutro Abs: 19 10*3/uL — ABNORMAL HIGH (ref 1.7–7.7)
Neutrophils Relative %: 88 %
Platelets: 335 10*3/uL (ref 150–400)
RBC: 3.08 MIL/uL — ABNORMAL LOW (ref 4.22–5.81)
RDW: 20.7 % — ABNORMAL HIGH (ref 11.5–15.5)
WBC: 21.6 10*3/uL — ABNORMAL HIGH (ref 4.0–10.5)
nRBC: 0.4 % — ABNORMAL HIGH (ref 0.0–0.2)

## 2023-11-03 LAB — BASIC METABOLIC PANEL
Anion gap: 6 (ref 5–15)
BUN: 44 mg/dL — ABNORMAL HIGH (ref 6–20)
CO2: 27 mmol/L (ref 22–32)
Calcium: 7.8 mg/dL — ABNORMAL LOW (ref 8.9–10.3)
Chloride: 105 mmol/L (ref 98–111)
Creatinine, Ser: 1.26 mg/dL — ABNORMAL HIGH (ref 0.61–1.24)
GFR, Estimated: >60 mL/min (ref >60)
Glucose, Bld: 122 mg/dL — ABNORMAL HIGH (ref 70–99)
Potassium: 3.8 mmol/L (ref 3.5–5.1)
Sodium: 138 mmol/L (ref 135–145)

## 2023-11-03 LAB — BLOOD GAS, VENOUS
Acid-Base Excess: 0.3 mmol/L (ref 0.0–2.0)
Bicarbonate: 25.4 mmol/L (ref 20.0–28.0)
O2 Saturation: 70.2 %
Patient temperature: 37
pCO2, Ven: 42 mm[Hg] — ABNORMAL LOW (ref 44–60)
pH, Ven: 7.39 (ref 7.25–7.43)
pO2, Ven: 40 mm[Hg] (ref 32–45)

## 2023-11-03 LAB — MAGNESIUM: Magnesium: 2 mg/dL (ref 1.7–2.4)

## 2023-11-03 MED ORDER — DIGOXIN 0.25 MG/ML IJ SOLN
0.2500 mg | Freq: Four times a day (QID) | INTRAMUSCULAR | Status: AC
  Administered 2023-11-03 – 2023-11-04 (×3): 0.25 mg via INTRAVENOUS
  Filled 2023-11-03 (×3): qty 1

## 2023-11-03 MED ORDER — POTASSIUM & SODIUM PHOSPHATES 280-160-250 MG PO PACK
1.0000 | PACK | Freq: Three times a day (TID) | ORAL | Status: AC
  Administered 2023-11-03 (×3): 1
  Filled 2023-11-03 (×3): qty 1

## 2023-11-03 MED ORDER — METOPROLOL TARTRATE 5 MG/5ML IV SOLN
5.0000 mg | Freq: Once | INTRAVENOUS | Status: AC
  Administered 2023-11-03: 5 mg via INTRAVENOUS
  Filled 2023-11-03: qty 5

## 2023-11-03 MED ORDER — DILTIAZEM LOAD VIA INFUSION
10.0000 mg | Freq: Once | INTRAVENOUS | Status: DC
  Filled 2023-11-03: qty 10

## 2023-11-03 MED ORDER — DILTIAZEM HCL 25 MG/5ML IV SOLN
10.0000 mg | Freq: Once | INTRAVENOUS | Status: AC
  Administered 2023-11-03: 10 mg via INTRAVENOUS
  Filled 2023-11-03: qty 5

## 2023-11-03 MED ORDER — METOPROLOL TARTRATE 50 MG PO TABS
50.0000 mg | ORAL_TABLET | Freq: Two times a day (BID) | ORAL | Status: DC
  Administered 2023-11-03 – 2023-11-04 (×4): 50 mg via ORAL
  Filled 2023-11-03 (×4): qty 1

## 2023-11-03 MED ORDER — CLONAZEPAM 1 MG PO TABS
1.0000 mg | ORAL_TABLET | Freq: Two times a day (BID) | ORAL | Status: DC
  Administered 2023-11-03 (×2): 1 mg
  Filled 2023-11-03 (×2): qty 1

## 2023-11-03 NOTE — Progress Notes (Signed)
 IVT consult placed for PICC removal in the setting of new onset fever. This RN to bedside.  Pt edematous and with 1 working PIV. Spoke with primary RN, able to obtain 1 additional access. PICC line removed. RN to reconsult should additional accesses be needed.

## 2023-11-03 NOTE — Progress Notes (Signed)
 NAME:  Daniel Butler, MRN:  [id number], DOB:  21-Jul-1966, LOS: 10 ADMISSION DATE:  10/24/2023, CONSULTATION DATE:  10/24/2023 REFERRING MD:  Dr. Bedelia Person - CCS, CHIEF COMPLAINT:  Bowel perf   History of Present Illness:  Daniel Butler is a 58 y.o. 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  States he has felt unwell x 3 days.  On ED arrival patient was seen febrile with temperature 103.4, tachypneic, and tachycardic.  Lab work significant for K2.4, glucose 116, creatinine 3.12, anion gap 26, alkaline phosphate 33, albumin 2.5, lactic 10.3, WBC 19.6.  CT abdomen and pelvis obtained which revealed substantial free air concerning for acute bowel perforation.  Surgery consulted and patient underwent exploratory laparotomy which revealed diffuse colonic distention with ischemia and gangrenous colonic necrosis with scattered areas of perforation and massive flocculent peritonitis.  Postprocedure patient remained intubated and sedated, PCCM consulted for further management admission.  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 OR for subtotal colectomy, intestinal discontinuity  2/15: con't on pressor support, ID consult  2/17: exploratory laparotomy, end ileostomy 2/18: goal to wake up and wean, will need TPN  2/19: waking, gets tachypneic, tachycardic with SBT 2/20: failed SBT. Starting trickle feeds, lasix BID, PO oxy and klonopin  2/21: extubated.  Interim History / Subjective:  Atrial fibrillation remains difficult to control. Febrile overnight. Became very delirious yesterday afternoon.   Objective   Blood pressure (!) 119/96, pulse (!) 130, temperature (!) 102.2 F (39 C), temperature source Rectal, resp. rate (!) 25, height 6\' 1"  (1.854 m), weight 118.3 kg, SpO2 100%.    SpO2: 100 % O2 Flow Rate (L/min): 4 L/min FiO2 (%): 30 %   Intake/Output Summary (Last 24 hours) at 11/03/2023 0847 Last data filed at 11/03/2023 0800 Gross per 24 hour  Intake 3657.13 ml  Output 1745 ml  Net 1912.13 ml   Filed Weights   11/01/23 0500 11/02/23 0500 11/03/23 0500  Weight: 115 kg 116.5 kg 118.3 kg   Physical Exam: General: acute on chronically ill appearing male, sitting in bed.  HENT: NGT in place. Soft speech. Normal thyroid, no proptosis.  Respiratory: Clear bilaterally.  No wheezing.  Cardiovascular:  atrial fibrillation with poor rate control.  Heart sounds unremarkable.  Extremities cool. GI: soft, midline VAC dressing. Minimal drainage. Ileostomy intact with normal stool output.  Extremities:Edema has resolved. Markedly decreased muscle mass.  Neuro: awake, no focal deficits. Generalized weakness.  Confused asking to leave, doesn't believe that he is in hospital  GU:  Foley catheter in place.   Clear urine.  Ancillary tests personally reviewed  Normal electrolytes and Creatinine 1.11 WBC 22.4 yesterday.   Assessment & Plan:  Septic shock in the setting of small bowel perforation s/p ex lap subtotal colectomy and ileostomy placement - resolved.  Enterococcus fecalis bacteremia  Atrial fibrillation likely 2/2 sepsis, critical illness  Acute hypoxic respiratory failure - now extubated.  Acute HFpEF with volume overload - now euvolemic. Protein calorie malnutrition Acute kidney injury on chronic kidney disease stage IIIa, improving Delirium   Plan:   - Delirium from prolonged ICU stay, fever and possible infection, abrupt discontinuation of BDZ - Added back clonazepam, started quetiapine. Wean off dexmedetomidine if possible.  - Continue current antibiotics for now. Discontinue PICC. - If fevers persist, repeat blood cultures and obtain abdominal/pelvic CT with contrast to identify abscess.  - Continue diltiazem and amiodarone, start metoprolol. Wean off IV amiodarone once afib controlled.  - Continue midodrine and hold further diuresis - Continue tube feeds, oral feeds for pleasure.  - Mobilize as tolerated, has not been mobile for some time.   Complete 14 days of Zosyn.  Best Practice (right click and "Reselect all SmartList Selections" daily)   Diet/type: tubefeeds and clear liquids DVT prophylaxis Full anticoagulation for afib. On Lovenox.  Pressure ulcer(s): N/A GI prophylaxis: PPI Lines: PICC removal ordered.  Foley: External catheter.  Code Status:  full code Last date of multidisciplinary goals of care discussion: updated wife at bedside 2/22  CRITICAL CARE Performed by: Lynnell Catalan  Total critical care time: 40 minutes  Critical care time was exclusive of separately billable procedures and treating other patients.  Critical care was necessary to treat or prevent imminent or life-threatening deterioration.  Critical care was time spent personally by me  on the following activities: development of treatment plan with patient and/or surrogate as well as nursing, discussions with consultants, evaluation of patient's response to treatment, examination of patient, obtaining history from patient or surrogate, ordering and performing treatments and interventions, ordering and review of laboratory studies, ordering and review of radiographic studies, pulse oximetry, re-evaluation of patient's condition and participation in multidisciplinary rounds.  Lynnell Catalan, MD [facility] ICU Physician [facility]  Pager: [phone] Mobile: [phone] After hours: [phone].

## 2023-11-03 NOTE — Progress Notes (Signed)
 Patient ID: Daniel Butler, male   DOB: 1965/09/27, 58 y.o.   MRN: [id number] 7 Days Post-Op    Subjective: Tachycardic and febrile this am. He is oriented to self but confused and not oriented to place or situation and asking to leave. He complains of pain all over.  Objective: Vital signs in last 24 hours: Temp:  [99.9 F (37.7 C)-102.6 F (39.2 C)] 102.2 F (39 C) (02/24 0700) Pulse Rate:  [68-130] 130 (02/24 0800) Resp:  [20-32] 25 (02/24 0800) BP: (89-135)/(55-96) 119/96 (02/24 0800) SpO2:  [95 %-100 %] 100 % (02/24 0800) Weight:  [829.5 kg] 118.3 kg (02/24 0500) Last BM Date : 11/03/23  Intake/Output from previous day: 02/23 0701 - 02/24 0700 In: 3710.2 [I.V.:1557.7; NG/GT:1440; IV Piggyback:712.5] Out: 1745 [Urine:1250; Drains:20; Stool:475] Intake/Output this shift: Total I/O In: 92.8 [I.V.:20.3; NG/GT:60; IV Piggyback:12.5] Out: 200 [Urine:200]  PE: General: ill appearing male who is laying in bed, anxious Heart: tachycardic Lungs: Respiratory effort nonlabored on nasal canula Abd: soft, ND, TTP diffusely but not rebound or guarding. NGT in place with Tfs running  Stoma pink and viable. Midline wound as below - start of granulation tissue Skin: warm and dry  Psych: Alert and oriented to self     Lab Results: CBC  Recent Labs    11/02/23 0453  WBC 22.4*  HGB 7.7*  HCT 26.1*  PLT 280   BMET Recent Labs    11/02/23 0955 11/03/23 0436  NA 139 138  K 3.6 3.8  CL 102 105  CO2 26 27  GLUCOSE 140* 122*  BUN 41* 44*  CREATININE 1.11 1.26*  CALCIUM 7.7* 7.8*   PT/INR No results for input(s): "LABPROT", "INR" in the last 72 hours. ABG Recent Labs    11/03/23 0518  HCO3 25.4    Studies/Results: DG Abd Portable 1V Result Date: 11/02/2023 CLINICAL DATA:  [id number] Encounter for feeding tube placement [id number] EXAM: PORTABLE ABDOMEN - 1 VIEW COMPARISON:  10/24/2023 FINDINGS: Limited radiograph of the lower chest and upper abdomen was obtained for  the purposes of enteric tube localization. Enteric tube is seen coursing below the diaphragm with distal tip and side port terminating within the expected location of the gastric body. IMPRESSION: Enteric tube terminates within the expected location of the gastric body. Electronically Signed   By: Duanne Guess D.O.   On: 11/02/2023 14:21    Anti-infectives: Anti-infectives (From admission, onward)    Start     Dose/Rate Route Frequency Ordered Stop   10/25/23 1400  piperacillin-tazobactam (ZOSYN) IVPB 3.375 g        3.375 g 12.5 mL/hr over 240 Minutes Intravenous Every 8 hours 10/25/23 0942 11/07/23 2359   10/25/23 0000  ceFEPIme (MAXIPIME) 2 g in sodium chloride 0.9 % 100 mL IVPB  Status:  Discontinued        2 g 200 mL/hr over 30 Minutes Intravenous Every 12 hours 10/24/23 1853 10/25/23 0942   10/24/23 2300  piperacillin-tazobactam (ZOSYN) IVPB 3.375 g  Status:  Discontinued        3.375 g 12.5 mL/hr over 240 Minutes Intravenous Every 8 hours 10/24/23 1509 10/24/23 1853   10/24/23 2300  metroNIDAZOLE (FLAGYL) IVPB 500 mg  Status:  Discontinued        500 mg 100 mL/hr over 60 Minutes Intravenous Every 12 hours 10/24/23 1838 10/25/23 0942   10/24/23 2000  micafungin (MYCAMINE) 100 mg in sodium chloride 0.9 % 100 mL IVPB  Status:  Discontinued  100 mg 105 mL/hr over 1 Hours Intravenous Every 24 hours 10/24/23 1838 10/29/23 1152   10/24/23 1945  vancomycin (VANCOREADY) IVPB 2000 mg/400 mL        2,000 mg 200 mL/hr over 120 Minutes Intravenous NOW 10/24/23 1854 10/24/23 2239   10/24/23 1854  vancomycin variable dose per unstable renal function (pharmacist dosing)  Status:  Discontinued         Does not apply See admin instructions 10/24/23 1854 10/25/23 0942   10/24/23 1245  ceFEPIme (MAXIPIME) 2 g in sodium chloride 0.9 % 100 mL IVPB        2 g 200 mL/hr over 30 Minutes Intravenous  Once 10/24/23 1236 10/24/23 1334   10/24/23 1245  metroNIDAZOLE (FLAGYL) IVPB 500 mg        500  mg 100 mL/hr over 60 Minutes Intravenous  Once 10/24/23 1236 10/24/23 1453       Assessment/Plan: s/p ex lap with subtotal abdominal colectomy in discontinuity, Dr. 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 Magnus Ivan 2/17 for pneumoperitoneum and gangrene of colon  POD 7   -tolerating tube feeds at 60 cc/hr. TNA off. Having ostomy output -continue wound VAC M/TH - healing well this am - fever this am and WBC up to 21.6 (22.4). Discussed on the unit with Dr. Denese Killings who is planning to dc PICC today. Continue to trend fever curve and WBC and consider CT abd if not improving tomorrow - continue IV abx -Continue therapies for deconditioning -OK to anticoagulate for a fib  FEN: NPO - SLP following with plan for FEES swallow study today. Tfs at goal VTE: therapeutic lovenox ID: [email or web address] 2/14. zosyn 2/15>>   LOS: 10 days   Eric Form, [facility] 11/03/2023, 9:26 AM Please see Amion for pager number during day hours 7:00am-4:30pm   11/03/2023

## 2023-11-03 NOTE — TOC Progression Note (Signed)
 Transition of Care [facility]) - Progression Note    Patient Details  Name: Daniel Butler MRN: [id number] Date of Birth: [birth year]  Transition of Care [facility]) CM/SW Contact  Mearl Latin, LCSW Phone Number: 11/03/2023, 9:42 AM  Clinical Narrative:    CSW continuing to follow. Patient still with restraints and NG tube. [facility] updated.     Expected Discharge Plan: Skilled Nursing Facility Barriers to Discharge: Continued Medical Work up  Expected Discharge Plan and Services In-house Referral: Clinical Social Work   Post Acute Care Choice: Skilled Nursing Facility Living arrangements for the past 2 months: Skilled Nursing Facility                                       Social Determinants of Health (SDOH) Interventions SDOH Screenings   Food Insecurity: Patient Unable To Answer (10/27/2023)  Housing: Patient Unable To Answer (10/27/2023)  Transportation Needs: Patient Unable To Answer (10/27/2023)  Utilities: Patient Unable To Answer (10/27/2023)  Alcohol Screen: Low Risk  (12/20/2022)  Depression (PHQ2-9): Low Risk  (12/20/2022)  Financial Resource Strain: High Risk (12/20/2022)  Physical Activity: Unknown (12/20/2022)  Social Connections: Socially Isolated (12/20/2022)  Stress: Stress Concern Present (12/20/2022)  Tobacco Use: Medium Risk (10/24/2023)    Readmission Risk Interventions    10/28/2023    5:25 PM 09/01/2023    4:24 PM  Readmission Risk Prevention Plan  Transportation Screening Complete Complete  PCP or Specialist Appt within 5-7 Days  Complete  Home Care Screening  Complete  Medication Review (RN CM)  Complete  Medication Review (RN Care Manager) Complete   PCP or Specialist appointment within 3-5 days of discharge Complete   HRI or Home Care Consult Complete   SW Recovery Care/Counseling Consult Complete   Palliative Care Screening Not Applicable   Skilled Nursing Facility Complete

## 2023-11-03 NOTE — Progress Notes (Signed)
 SLP  Note  Patient Details Name: Daniel Butler MRN: [id number] DOB: 06/29/1966  Plan for FEES swallow study in room at 12.    Retina Bernardy, Riley Nearing 11/03/2023, 9:28 AM

## 2023-11-03 NOTE — Procedures (Signed)
 Cortrak  Person Inserting Tube:  Osa Craver, RD Tube Type:  Cortrak - 43 inches Tube Size:  10 Tube Location:  Left nare Initial Placement:  Stomach Secured by: Bridle Technique Used to Measure Tube Placement:  Marking at nare/corner of mouth Cortrak Secured At:  79 cm  Cortrak Tube Team Note:  Consult received to place a Cortrak feeding tube.   Abd xray required to confirm placement. RN may use tube once placement confirmed via xray.  If the tube becomes dislodged please keep the tube and contact the Cortrak team at [email or web address] for replacement.  If after hours and replacement cannot be delayed, place a NG tube and confirm placement with an abdominal x-ray.   Romelle Starcher MS, RDN, LDN, CNSC Registered Dietitian 3 Clinical Nutrition RD Inpatient Contact Info in Amion

## 2023-11-03 NOTE — Procedures (Signed)
 Objective Swallowing Evaluation: Type of Study: FEES-Fiberoptic Endoscopic Evaluation of Swallow   Patient Details  Name: Daniel Butler MRN: [id number] Date of Birth: 01/09/1966  Today's Date: 11/03/2023 Time: SLP Start Time (ACUTE ONLY): 1220 -SLP Stop Time (ACUTE ONLY): 1250  SLP Time Calculation (min) (ACUTE ONLY): 30 min   Past Medical History:  Past Medical History:  Diagnosis Date   Brain aneurysm    Chicken pox    Depression    Elevated LFTs    Gout    Hernia of abdominal cavity 11/2020   High cholesterol    Hypertension    Past Surgical History:  Past Surgical History:  Procedure Laterality Date   BIOPSY  09/02/2023   Procedure: BIOPSY;  Surgeon: Jenel Lucks, MD;  Location: [facility] ENDOSCOPY;  Service: Gastroenterology;;   CARDIOVERSION N/A 01/22/2021   Procedure: CARDIOVERSION;  Surgeon: Meriam Sprague, MD;  Location: [facility] ENDOSCOPY;  Service: Cardiovascular;  Laterality: N/A;   COLON RESECTION N/A 10/24/2023   Procedure: COLON RESECTION;  Surgeon: Diamantina Monks, MD;  Location: [facility] OR;  Service: General;  Laterality: N/A;  wound vac applied   COLONOSCOPY N/A 09/02/2023   Procedure: COLONOSCOPY;  Surgeon: Jenel Lucks, MD;  Location: [facility] ENDOSCOPY;  Service: Gastroenterology;  Laterality: N/A;   CRANIOTOMY Right 03/02/2015   Procedure: CRANIOTOMY INTRACRANIAL  ANEURYSM FOR CLIPPING;  Surgeon: Lisbeth Renshaw, MD;  Location: [facility] NEURO ORS;  Service: Neurosurgery;  Laterality: Right;   ESOPHAGOGASTRODUODENOSCOPY N/A 09/02/2023   Procedure: ESOPHAGOGASTRODUODENOSCOPY (EGD);  Surgeon: Jenel Lucks, MD;  Location: [facility] ENDOSCOPY;  Service: Gastroenterology;  Laterality: N/A;   HEMOSTASIS CLIP PLACEMENT  09/02/2023   Procedure: HEMOSTASIS CLIP PLACEMENT;  Surgeon: Jenel Lucks, MD;  Location: [facility] ENDOSCOPY;  Service: Gastroenterology;;   ILEOSTOMY Right 10/27/2023   Procedure: ILEOSTOMY;  Surgeon: Abigail Miyamoto, MD;  Location: [facility] OR;   Service: General;  Laterality: Right;   IR GENERIC HISTORICAL  02/16/2016   IR ANGIO VERTEBRAL SEL VERTEBRAL BILAT MOD SED 02/16/2016 Lisbeth Renshaw, MD [facility]-INTERV RAD   IR GENERIC HISTORICAL  02/16/2016   IR ANGIO INTRA EXTRACRAN SEL INTERNAL CAROTID BILAT MOD SED 02/16/2016 Lisbeth Renshaw, MD [facility]   LAPAROTOMY N/A 10/24/2023   Procedure: EXPLORATORY LAPAROTOMY;  Surgeon: Diamantina Monks, MD;  Location: [facility] OR;  Service: General;  Laterality: N/A;  wound vac applied   LAPAROTOMY N/A 10/27/2023   Procedure: RE-EXPLORATORY LAPAROTOMY WITH ABDOMINAL CLOSURE;  Surgeon: Abigail Miyamoto, MD;  Location: [facility] OR;  Service: General;  Laterality: N/A;  Removal of Wound Vac   POLYPECTOMY  09/02/2023   Procedure: POLYPECTOMY;  Surgeon: Jenel Lucks, MD;  Location: [facility] ENDOSCOPY;  Service: Gastroenterology;;   RADIOLOGY WITH ANESTHESIA N/A 03/02/2015   Procedure: RADIOLOGY WITH ANESTHESIA;  Surgeon: Lisbeth Renshaw, MD;  Location: [facility] OR;  Service: Radiology;  Laterality: N/A;   RIGHT/LEFT HEART CATH AND CORONARY ANGIOGRAPHY N/A 12/19/2020   Procedure: RIGHT/LEFT HEART CATH AND CORONARY ANGIOGRAPHY;  Surgeon: Swaziland, Peter M, MD;  Location: [facility] INVASIVE CV LAB;  Service: Cardiovascular;  Laterality: N/A;   WISDOM TOOTH EXTRACTION     HPI: Patient is a 58 y.o. male with PMH: orthostatic hypotension, chronic diastolic congestive heart failure, persistent atrial fibrillation on Eliquis, CKD stage IIIa, anemia of chronic disease, severe alcohol use disorder, chronic ulcer of the left foot, prior SAH secondary to aneurysm s/p clipping, depression. He presented to the ED at [facility] on 10/24/23 with complaints of abdominal pain and swelling with associated nausea and  small volume emesis. In ED, he was febrile at 103.4, tachypneic, tachycardic, CT abdomen and pelvis obtained which revealed substantial free concerning for acute bowel perforation.  Surgery consulted and patient underwent exploratory  laparotomy on 2/17 which revealed diffuse colonic distention with ischemia and gangrenous colonic necrosis with scattered areas of perforation and massive flocculent peritonitis.  He remained sedated and intubated s/p surgery and was extubated on 2/21. He is NPO with large bore NG for nutrition.   Subjective: awake, alert, wife in room    Recommendations for follow up therapy are one component of a multi-disciplinary discharge planning process, led by the attending physician.  Recommendations may be updated based on patient status, additional functional criteria and insurance authorization.  Assessment / Plan / Recommendation     11/03/2023    1:00 PM  Clinical Impressions  Clinical Impression Pt demonstrates a moderate dysphagia with a DIGEST score of 3 primarily due to poor efficency of swallowing with a majority of residue with thicker liquids and puree. Pt has good glottic closure prior to the swallow and ejects most penetrate and aspirate with a second swallow or cough. However there was diffuse weakness across the swallowing mechanism (base of tongue, decreased epiglottic deflection, laryngeal elevation, hyoid excursion and PES opening). Residue accumulated and worsened and additionally pt showed signs of fatigue. Quanity and depth of penetration/aspiration increased as study progressed.  Pt is not ready for a PO diet and ongoing use of alternate means of nutrition recommended. Pt can be given small sips of thin water more regularly with frequent oral care also given. Hopeful to increase oral intake as activity tolerance increases.  SLP Visit Diagnosis Dysphagia, oropharyngeal phase (R13.12)  Attention and concentration deficit following --  Frontal lobe and executive function deficit following --  Impact on safety and function Risk for inadequate nutrition/hydration;Moderate aspiration risk         11/03/2023    1:00 PM  Treatment Recommendations  Treatment Recommendations Therapy as  outlined in treatment plan below        11/03/2023    1:00 PM  Prognosis  Prognosis for improved oropharyngeal function Good  Barriers to Reach Goals --  Barriers/Prognosis Comment --       11/03/2023    1:00 PM  Diet Recommendations  SLP Diet Recommendations Free water protocol after oral care  Liquid Administration via --  Medication Administration Via alternative means  Compensations --  Postural Changes --         11/03/2023    1:00 PM  Other Recommendations  Recommended Consults --  Oral Care Recommendations --  Caregiver Recommendations --  Follow Up Recommendations Skilled nursing-short term rehab (<3 hours/day)  Assistance recommended at discharge --  Functional Status Assessment --       11/03/2023    1:00 PM  Frequency and Duration   Speech Therapy Frequency (ACUTE ONLY) min 2x/week  Treatment Duration 2 weeks         11/03/2023    1:00 PM  Oral Phase  Oral Phase Impaired  Oral - Pudding Teaspoon --  Oral - Pudding Cup --  Oral - Honey Teaspoon --  Oral - Honey Cup --  Oral - Nectar Teaspoon --  Oral - Nectar Cup --  Oral - Nectar Straw --  Oral - Thin Teaspoon --  Oral - Thin Cup --  Oral - Thin Straw --  Oral - Puree --  Oral - Mech Soft --  Oral - Regular --  Oral -  Multi-Consistency --  Oral - Pill --  Oral Phase - Comment --       11/03/2023    1:00 PM  Pharyngeal Phase  Pharyngeal Phase Impaired  Pharyngeal- Pudding Teaspoon --  Pharyngeal --  Pharyngeal- Pudding Cup --  Pharyngeal --  Pharyngeal- Honey Teaspoon Reduced epiglottic inversion;Reduced anterior laryngeal mobility;Reduced laryngeal elevation;Reduced tongue base retraction;Penetration/Aspiration during swallow;Penetration/Apiration after swallow;Pharyngeal residue - valleculae;Pharyngeal residue - pyriform;Pharyngeal residue - posterior pharnyx;Inter-arytenoid space residue;Lateral channel residue  Pharyngeal Material enters airway, CONTACTS cords and then ejected out   Pharyngeal- Honey Cup --  Pharyngeal --  Pharyngeal- Nectar Teaspoon Reduced epiglottic inversion;Reduced anterior laryngeal mobility;Reduced laryngeal elevation;Reduced tongue base retraction;Penetration/Aspiration during swallow;Penetration/Apiration after swallow;Pharyngeal residue - valleculae;Pharyngeal residue - pyriform;Pharyngeal residue - posterior pharnyx;Inter-arytenoid space residue;Lateral channel residue  Pharyngeal Material enters airway, CONTACTS cords and then ejected out  Pharyngeal- Nectar Cup --  Pharyngeal --  Pharyngeal- Nectar Straw Reduced epiglottic inversion;Reduced anterior laryngeal mobility;Reduced laryngeal elevation;Reduced tongue base retraction;Penetration/Aspiration during swallow;Penetration/Apiration after swallow;Pharyngeal residue - valleculae;Pharyngeal residue - pyriform;Pharyngeal residue - posterior pharnyx;Inter-arytenoid space residue;Lateral channel residue  Pharyngeal Material enters airway, CONTACTS cords and then ejected out;Material enters airway, passes BELOW cords then ejected out  Pharyngeal- Thin Teaspoon Reduced epiglottic inversion;Reduced anterior laryngeal mobility;Reduced laryngeal elevation;Reduced tongue base retraction;Pharyngeal residue - valleculae;Pharyngeal residue - pyriform;Pharyngeal residue - posterior pharnyx;Inter-arytenoid space residue;Lateral channel residue  Pharyngeal Material does not enter airway  Pharyngeal- Thin Cup Reduced epiglottic inversion;Reduced anterior laryngeal mobility;Reduced laryngeal elevation;Reduced tongue base retraction;Pharyngeal residue - valleculae;Pharyngeal residue - pyriform;Pharyngeal residue - posterior pharnyx;Inter-arytenoid space residue;Lateral channel residue  Pharyngeal --  Pharyngeal- Thin Straw Reduced epiglottic inversion;Reduced anterior laryngeal mobility;Reduced laryngeal elevation;Reduced tongue base retraction;Penetration/Aspiration during swallow;Penetration/Apiration after  swallow;Pharyngeal residue - valleculae;Pharyngeal residue - pyriform;Pharyngeal residue - posterior pharnyx;Inter-arytenoid space residue;Lateral channel residue;Trace aspiration  Pharyngeal Material enters airway, passes BELOW cords then ejected out  Pharyngeal- Puree Reduced epiglottic inversion;Reduced anterior laryngeal mobility;Reduced laryngeal elevation;Reduced tongue base retraction;Pharyngeal residue - valleculae;Pharyngeal residue - pyriform;Pharyngeal residue - posterior pharnyx;Inter-arytenoid space residue;Lateral channel residue  Pharyngeal --  Pharyngeal- Mechanical Soft --  Pharyngeal --  Pharyngeal- Regular --  Pharyngeal --  Pharyngeal- Multi-consistency --  Pharyngeal --  Pharyngeal- Pill --  Pharyngeal --  Pharyngeal Comment --         No data to display           Shyna Duignan, Riley Nearing 11/03/2023, 1:19 PM

## 2023-11-03 NOTE — Progress Notes (Signed)
 Contacted e-link regarding pt assessment at the beginning of the shift. Pt appears to be very sedated. Per CCM MD, hold clonopin and cardizem for 2200. Titrating down on precedex. This RN will continue to monitor and assess the pt and notify of any additional changes.   Pt's temperature continues to rise despite PRN tylenol, ice packs, cold rags, and fans. Contacted e-link for possibility of a cooling blanket. This RN will continue to monitor and assess the pt and notify of any additional changes.

## 2023-11-03 NOTE — Progress Notes (Signed)
 Notified e-link of persistent rapid atrial fibrillation. Spoke with Valora Piccolo, MD on camera in pt room. New orders per Valora Piccolo, MD. This RN will continue to monitor and assess the pt and notify of any additional changes.

## 2023-11-03 NOTE — Progress Notes (Addendum)
 eLink Physician-Brief Progress Note Patient Name: Daniel Butler DOB: 1965-10-02 MRN: [id number]   Date of Service  11/03/2023  HPI/Events of Note  Notified of rapid atrial fibrillation with rate in the 140s-150s.  Pt is on cardizem PO and started on metoprolol 50mg  BID.  He missed his AM dose of cardizem due to hypotension.    SBP 120s, HR 140s-150s on camera assessment.   eICU Interventions  Give lopressor 5mg  IV now.      Intervention Category Intermediate Interventions: Arrhythmia - evaluation and management  Larinda Buttery 11/03/2023, 7:27 PM  8:30 PM Pt remains in rapid atrial fibrillation with rate in the 140s-160s.   Plan> Give cardizem 10mg  IV now.  Start on digoxin load.  Pt is on amiodarone gtt as well.   11:41 PM Received query regarding scheduled cardizem PO.  Pt's BP 83/61, MP 66, HR currently in the 100s.   Plan> Hold cardizem for SBP <100.   12:36 AM RN requesting for wrist restraints.   Plan> Bilateral wrist restraints ordered.

## 2023-11-03 NOTE — Consult Note (Signed)
 WOC Nurse wound follow up Wound type: Abdominal dehiscence. Changed VAC. PA followed the procedure. Measurement: 23 x 4 x 5 cm Wound bed: 100% red. Pink granulated tissue. Removed old NPWT dressing Cleansed wound with normal saline Periwound skin protected with skin barrier wipe or window framed with drape  Filled wound with 1 piece of black foam  Applied barrier ring surrounding the umbilical area, hernia? Sealed NPWT dressing at . Patient received IV/PO pain medication per bedside nurse prior to dressing change Patient tolerated procedure well  WOC nurse will continue to provide NPWT dressing changed due to the complexity of the dressing change. Next change on Thursday.   WOC Nurse ostomy follow up Stomal assessment/size: 38 x 35 mm slightly above skin level, red moist productive of brown liquid effluent  Peristomal assessment:  intact  Output approximately 60 ml brown liquid  Ostomy pouching: 2 piece 2 1/4" soft convex skin barrier Hart [place] #[id number], 2 1/4" pouch Hart [place] #[id number] and 2" barrier ring Hart [place] [phone]  Education provided: none, patient is confused and not able to learn, nobody of his family was in the room. Enrolled patient in [employer] Start Discharge program: No

## 2023-11-04 DIAGNOSIS — B952 Enterococcus as the cause of diseases classified elsewhere: Secondary | ICD-10-CM

## 2023-11-04 DIAGNOSIS — I4891 Unspecified atrial fibrillation: Secondary | ICD-10-CM | POA: Diagnosis not present

## 2023-11-04 DIAGNOSIS — K659 Peritonitis, unspecified: Secondary | ICD-10-CM | POA: Diagnosis not present

## 2023-11-04 DIAGNOSIS — A4181 Sepsis due to Enterococcus: Secondary | ICD-10-CM | POA: Diagnosis not present

## 2023-11-04 DIAGNOSIS — E46 Unspecified protein-calorie malnutrition: Secondary | ICD-10-CM | POA: Diagnosis not present

## 2023-11-04 LAB — CBC
HCT: 27.5 % — ABNORMAL LOW (ref 39.0–52.0)
Hemoglobin: 8.5 g/dL — ABNORMAL LOW (ref 13.0–17.0)
MCH: 26 pg (ref 26.0–34.0)
MCHC: 30.9 g/dL (ref 30.0–36.0)
MCV: 84.1 fL (ref 80.0–100.0)
Platelets: 336 10*3/uL (ref 150–400)
RBC: 3.27 MIL/uL — ABNORMAL LOW (ref 4.22–5.81)
RDW: 20.4 % — ABNORMAL HIGH (ref 11.5–15.5)
WBC: 16.3 10*3/uL — ABNORMAL HIGH (ref 4.0–10.5)
nRBC: 0.2 % (ref 0.0–0.2)

## 2023-11-04 LAB — GLUCOSE, CAPILLARY
Glucose-Capillary: 117 mg/dL — ABNORMAL HIGH (ref 70–99)
Glucose-Capillary: 117 mg/dL — ABNORMAL HIGH (ref 70–99)
Glucose-Capillary: 118 mg/dL — ABNORMAL HIGH (ref 70–99)
Glucose-Capillary: 126 mg/dL — ABNORMAL HIGH (ref 70–99)
Glucose-Capillary: 130 mg/dL — ABNORMAL HIGH (ref 70–99)
Glucose-Capillary: 144 mg/dL — ABNORMAL HIGH (ref 70–99)

## 2023-11-04 MED ORDER — CLONAZEPAM 0.5 MG PO TBDP
0.5000 mg | ORAL_TABLET | Freq: Two times a day (BID) | ORAL | Status: AC
Start: 2023-11-04 — End: 2023-11-06
  Administered 2023-11-04 – 2023-11-05 (×3): 0.5 mg
  Filled 2023-11-04 (×3): qty 1

## 2023-11-04 MED ORDER — CLONAZEPAM 0.125 MG PO TBDP
0.2500 mg | ORAL_TABLET | Freq: Two times a day (BID) | ORAL | Status: AC
  Administered 2023-11-06 – 2023-11-07 (×3): 0.25 mg
  Filled 2023-11-04 (×5): qty 2

## 2023-11-04 NOTE — Progress Notes (Signed)
 Physical Therapy Treatment Patient Details Name: Daniel Butler MRN: [id number] DOB: 19-Aug-1966 Today's Date: 11/04/2023   History of Present Illness 58 y.o. male admitted to [facility] 10/24/23 from SNF forAbdominal pain, N/V, fever, found to have bowel perforation and underwent exp lap with colectomy and end ileostomy. Multiple abdominal surgeries over his first few days in the hospital (2/14, 2/15, 2/17).  He has a post op wound vac and ostomy bag as well as A-fib. Pt with significant PMH of brain aneurysm, gout, HTN.    PT Comments  Pt received in bed, lethargic today but arouses to name and tactile stimulation and was conversant at times during session. Had notable word finding difficulties. Pt tolerated bed into chair position and performed active assisted LE there ex. Pt then pivoted to EOB with max A +2 and tolerated a few more exercises but he c/o increased abdominal pain when sitting EOB and could maintain only 5 mins.  Pt quickly asleep once returned to supine. Patient will benefit from continued inpatient follow up therapy, <3 hours/day. PT will continue to follow.    If plan is discharge home, recommend the following: Two people to help with walking and/or transfers;Two people to help with bathing/dressing/bathroom   Can travel by Doctor, hospital (measurements PT);Wheelchair cushion (measurements PT);Hospital bed;Hoyer lift    Recommendations for Other Services       Precautions / Restrictions Precautions Precautions: Fall;Other (comment) Precaution/Restrictions Comments: abdominal wound vac Restrictions Weight Bearing Restrictions Per Provider Order: No     Mobility  Bed Mobility Overal bed mobility: Needs Assistance Bed Mobility: Sit to Supine, Supine to Sit     Supine to sit: Max assist, +2 for physical assistance Sit to supine: +2 for physical assistance, Total assist   General bed mobility comments: pt needed max A  for LE's off bed and max A for elevation of trunk into sitting. Tot A +2 for return to supine    Transfers                   General transfer comment: pt too lethargic to attempt standing    Ambulation/Gait               General Gait Details: unable   Stairs             Wheelchair Mobility     Tilt Bed    Modified Rankin (Stroke Patients Only)       Balance Overall balance assessment: Needs assistance Sitting-balance support: Bilateral upper extremity supported, Feet supported Sitting balance-Leahy Scale: Poor Sitting balance - Comments: needed min A to maintain unsupported sitting Postural control: Posterior lean                                  Communication Communication Communication: Impaired (some expressive difficulties today) Factors Affecting Communication: Difficulty expressing self  Cognition Arousal: Lethargic Behavior During Therapy: Flat affect   PT - Cognitive impairments: Sequencing, Memory, Safety/Judgement, Problem solving                       PT - Cognition Comments: limited by lethargy Following commands: Intact      Cueing Cueing Techniques: Verbal cues, Gestural cues, Tactile cues  Exercises General Exercises - Lower Extremity Ankle Circles/Pumps: AAROM, Both, 10 reps, Seated Short Arc Quad: AAROM, Both, 10 reps, Seated  Long Arc Quad: PROM, Both, 10 reps, Seated    General Comments General comments (skin integrity, edema, etc.): VSS. Pt c/o increased pain when sitting EOB. Requested prevalon boots as pt not moving feet much, RN ordering      Pertinent Vitals/Pain Pain Assessment Pain Assessment: 0-10 Pain Score: 10-Worst pain ever Pain Location: abdomen Pain Descriptors / Indicators: Grimacing, Guarding Pain Intervention(s): Limited activity within patient's tolerance, Monitored during session    Home Living                          Prior Function            PT Goals  (current goals can now be found in the care plan section) Acute Rehab PT Goals Patient Stated Goal: to get back to gardening again, standing and walking per wife PT Goal Formulation: With patient/family Time For Goal Achievement: 11/15/23 Potential to Achieve Goals: Fair Progress towards PT goals: Progressing toward goals (slowly)    Frequency    Min 1X/week      PT Plan      Co-evaluation              AM-PAC PT "6 Clicks" Mobility   Outcome Measure  Help needed turning from your back to your side while in a flat bed without using bedrails?: Total Help needed moving from lying on your back to sitting on the side of a flat bed without using bedrails?: Total Help needed moving to and from a bed to a chair (including a wheelchair)?: Total Help needed standing up from a chair using your arms (e.g., wheelchair or bedside chair)?: Total Help needed to walk in hospital room?: Total Help needed climbing 3-5 steps with a railing? : Total 6 Click Score: 6    End of Session   Activity Tolerance: Patient limited by pain;Patient limited by lethargy Patient left: in bed;with call bell/phone within reach;with bed alarm set Nurse Communication: Mobility status PT Visit Diagnosis: Muscle weakness (generalized) (M62.81);Difficulty in walking, not elsewhere classified (R26.2);Pain Pain - Right/Left:  (abdomen) Pain - part of body:  (abdomen)     Time: 1610-9604 PT Time Calculation (min) (ACUTE ONLY): 20 min  Charges:    $Therapeutic Activity: 8-22 mins PT General Charges $$ ACUTE PT VISIT: 1 Visit                     Lyanne Co, PT  Acute Rehab Services Secure chat preferred Office [phone]    Lawana Chambers Solomia Harrell 11/04/2023, 2:09 PM

## 2023-11-04 NOTE — Progress Notes (Signed)
 Patient ID: Daniel Butler, male   DOB: [birth year], 58 y.o.   MRN: [id number] 8 Days Post-Op    Subjective: Mentation better this am. Complains of mild nausea and abdominal pain. He just drank water and thinks nausea is related to that. Tube feeds are running and ostomy productive  Wife at bedside  Objective: Vital signs in last 24 hours: Temp:  [97.9 F (36.6 C)-100.4 F (38 C)] 97.9 F (36.6 C) (02/25 0800) Pulse Rate:  [82-162] 99 (02/25 1000) Resp:  [19-30] 25 (02/25 1000) BP: (83-160)/(62-134) 132/78 (02/25 1000) SpO2:  [95 %-100 %] 100 % (02/25 1000) Weight:  [409.8 kg] 116.4 kg (02/25 0500) Last BM Date : 11/04/23  Intake/Output from previous day: 02/24 0701 - 02/25 0700 In: 1705.4 [I.V.:414.4; NG/GT:1140; IV Piggyback:151.1] Out: 1810 [Urine:1325; Drains:25; Stool:460] Intake/Output this shift: Total I/O In: 255.7 [I.V.:49.9; NG/GT:180; IV Piggyback:25.8] Out: 450 [Urine:200; Stool:250]  PE: General: ill appearing male who is laying in bed Heart: not tachycardic at time of my exam Lungs: Respiratory effort nonlabored on nasal canula Abd: soft, ND, TTP diffusely but no rebound or guarding. cortrak in place with Tfs running  Ostomy with thin stool. Midline wound with vac with good suction Skin: warm and dry  Psych: Alert and oriented x3     Lab Results: CBC  Recent Labs    11/03/23 0817 11/04/23 0818  WBC 21.6* 16.3*  HGB 8.1* 8.5*  HCT 26.7* 27.5*  PLT 335 336   BMET Recent Labs    11/02/23 0955 11/03/23 0436  NA 139 138  K 3.6 3.8  CL 102 105  CO2 26 27  GLUCOSE 140* 122*  BUN 41* 44*  CREATININE 1.11 1.26*  CALCIUM 7.7* 7.8*   PT/INR No results for input(s): "LABPROT", "INR" in the last 72 hours. ABG Recent Labs    11/03/23 0518  HCO3 25.4    Studies/Results: DG Abd Portable 1V Result Date: 11/03/2023 CLINICAL DATA:  Feeding tube placement EXAM: PORTABLE ABDOMEN - 1 VIEW limited for tube placement COMPARISON:  X-ray 11/02/2023  FINDINGS: Limited x-ray of the upper abdomen for tube placement has a Dobbhoff tube with tip overlying the epigastric region in the midline, likely distal stomach. Gas in the stomach otherwise. Overlapping artifacts. IMPRESSION: Limited x-ray for tube placement has feeding tube overlying the extreme distal stomach. Electronically Signed   By: Karen Kays M.D.   On: 11/03/2023 16:38   DG Abd Portable 1V Result Date: 11/02/2023 CLINICAL DATA:  [id number] Encounter for feeding tube placement [id number] EXAM: PORTABLE ABDOMEN - 1 VIEW COMPARISON:  10/24/2023 FINDINGS: Limited radiograph of the lower chest and upper abdomen was obtained for the purposes of enteric tube localization. Enteric tube is seen coursing below the diaphragm with distal tip and side port terminating within the expected location of the gastric body. IMPRESSION: Enteric tube terminates within the expected location of the gastric body. Electronically Signed   By: Duanne Guess D.O.   On: 11/02/2023 14:21    Anti-infectives: Anti-infectives (From admission, onward)    Start     Dose/Rate Route Frequency Ordered Stop   10/25/23 1400  piperacillin-tazobactam (ZOSYN) IVPB 3.375 g        3.375 g 12.5 mL/hr over 240 Minutes Intravenous Every 8 hours 10/25/23 0942 11/07/23 2359   10/25/23 0000  ceFEPIme (MAXIPIME) 2 g in sodium chloride 0.9 % 100 mL IVPB  Status:  Discontinued        2 g 200 mL/hr over 30 Minutes Intravenous Every  12 hours 10/24/23 1853 10/25/23 0942   10/24/23 2300  piperacillin-tazobactam (ZOSYN) IVPB 3.375 g  Status:  Discontinued        3.375 g 12.5 mL/hr over 240 Minutes Intravenous Every 8 hours 10/24/23 1509 10/24/23 1853   10/24/23 2300  metroNIDAZOLE (FLAGYL) IVPB 500 mg  Status:  Discontinued        500 mg 100 mL/hr over 60 Minutes Intravenous Every 12 hours 10/24/23 1838 10/25/23 0942   10/24/23 2000  micafungin (MYCAMINE) 100 mg in sodium chloride 0.9 % 100 mL IVPB  Status:  Discontinued        100 mg 105  mL/hr over 1 Hours Intravenous Every 24 hours 10/24/23 1838 10/29/23 1152   10/24/23 1945  vancomycin (VANCOREADY) IVPB 2000 mg/400 mL        2,000 mg 200 mL/hr over 120 Minutes Intravenous NOW 10/24/23 1854 10/24/23 2239   10/24/23 1854  vancomycin variable dose per unstable renal function (pharmacist dosing)  Status:  Discontinued         Does not apply See admin instructions 10/24/23 1854 10/25/23 0942   10/24/23 1245  ceFEPIme (MAXIPIME) 2 g in sodium chloride 0.9 % 100 mL IVPB        2 g 200 mL/hr over 30 Minutes Intravenous  Once 10/24/23 1236 10/24/23 1334   10/24/23 1245  metroNIDAZOLE (FLAGYL) IVPB 500 mg        500 mg 100 mL/hr over 60 Minutes Intravenous  Once 10/24/23 1236 10/24/23 1453       Assessment/Plan: s/p ex lap with subtotal abdominal colectomy in discontinuity, Dr. 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 Magnus Ivan 2/17 for pneumoperitoneum and gangrene of colon  POD 8   -tolerating tube feeds at 60 cc/hr. TNA off. Having ostomy output -continue wound VAC M/TH  - fever curve improving with tmax 100.23F this am 0400. WBC also improved 16 from 21.6. Picc out yesterday. Continue abx and continue to trend fever curve and WBC. Possible CT abd if does not continue to improve but can hold off at this time - continue IV abx -Continue therapies for deconditioning -OK to anticoagulate for a fib  FEN: NPO - SLP following. Tfs at goal VTE: therapeutic lovenox ID: [email or web address] 2/14. zosyn 2/15>>   LOS: 11 days   Eric Form, [facility] 11/04/2023, 11:07 AM Please see Amion for pager number during day hours 7:00am-4:30pm   11/04/2023

## 2023-11-04 NOTE — Progress Notes (Signed)
 PHARMACY - ANTICOAGULATION CONSULT NOTE  Pharmacy Consult for Lovenox   Indication: atrial fibrillation  Allergies  Allergen Reactions   Zetia [Ezetimibe] Nausea And Vomiting    Patient Measurements: Height: 6\' 1"  (185.4 cm) Weight: 116.4 kg (256 lb 9.9 oz) IBW/kg (Calculated) : 79.9  Vital Signs: Temp: 100.4 F (38 C) (02/25 0400) Temp Source: Axillary (02/25 0400) BP: 109/66 (02/25 0700) Pulse Rate: 109 (02/25 0700)  Labs: Recent Labs    11/02/23 0453 11/02/23 0730 11/02/23 0955 11/03/23 0436 11/03/23 0817  HGB 7.7*  --   --   --  8.1*  HCT 26.1*  --   --   --  26.7*  PLT 280  --   --   --  335  CREATININE  --  1.12 1.11 1.26*  --     Estimated Creatinine Clearance: 85.4 mL/min (A) (by C-G formula based on SCr of 1.26 mg/dL (H)).   Assessment: 58 y.o. M well known to pharmacy from TPN dosing. Pt with new onset Afib started on full dose AC with Lovenox.  Renal function and CBC stable; no bleeding reported.  Goal of Therapy:  Anti-Xa level 0.6-1 units/ml 4hrs after LMWH dose given Monitor platelets by anticoagulation protocol: Yes   Plan:  Continue Lovenox 115mg  SQ Q12H Will f/u CBC q72h while on Lovenox and will f/u renal function  Carolyne Whitsel D. Laney Potash, PharmD, BCPS, BCCCP 11/04/2023, 9:11 AM

## 2023-11-04 NOTE — Progress Notes (Signed)
 Speech Language Pathology Treatment: Dysphagia  Patient Details Name: Daniel Butler MRN: [id number] DOB: 1965-12-30 Today's Date: 11/04/2023 Time: 1010-1020 SLP Time Calculation (min) (ACUTE ONLY): 10 min  Assessment / Plan / Recommendation Clinical Impression  Pt alert and cooperative, SLP positioned head in a more neutral position and offered sips of water with cues for effortful multiple swallows. No signs of aspiration including wet vocal quality observed. Pt not interested in more than an ounce of water. Reports feeling bloated. Recommended to wife and RN that pt be offered sips of water without restriction. Hopeful that use of swallow mechanism will rehydrate and improve strength. May retest later this week if progress continues.   HPI HPI: Patient is a 58 y.o. male with PMH: orthostatic hypotension, chronic diastolic congestive heart failure, persistent atrial fibrillation on Eliquis, CKD stage IIIa, anemia of chronic disease, severe alcohol use disorder, chronic ulcer of the left foot, prior SAH secondary to aneurysm s/p clipping, depression. He presented to the ED at [facility] on 10/24/23 with complaints of abdominal pain and swelling with associated nausea and small volume emesis. In ED, he was febrile at 103.4, tachypneic, tachycardic, CT abdomen and pelvis obtained which revealed substantial free concerning for acute bowel perforation.  Surgery consulted and patient underwent exploratory laparotomy on 2/17 which revealed diffuse colonic distention with ischemia and gangrenous colonic necrosis with scattered areas of perforation and massive flocculent peritonitis.  He remained sedated and intubated s/p surgery and was extubated on 2/21. He is NPO with large bore NG for nutrition.      SLP Plan  Continue with current plan of care      Recommendations for follow up therapy are one component of a multi-disciplinary discharge planning process, led by the attending physician.   Recommendations may be updated based on patient status, additional functional criteria and insurance authorization.    Recommendations  Diet recommendations: NPO;Other(comment) (except sips of water) Liquids provided via: Straw                  Oral care QID;Oral care prior to ice chip/H20;Staff/trained caregiver to provide oral care   Frequent or constant Supervision/Assistance Dysphagia, oropharyngeal phase (R13.12)     Continue with current plan of care     Atul Delucia, Riley Nearing  11/04/2023, 10:29 AM

## 2023-11-04 NOTE — Progress Notes (Signed)
 NAME:  Daniel Butler, MRN:  [id number], DOB:  1965/12/31, LOS: 11 ADMISSION DATE:  10/24/2023, CONSULTATION DATE:  10/24/2023 REFERRING MD:  Dr. Bedelia Person - CCS, CHIEF COMPLAINT:  Bowel perf   History of Present Illness:  Daniel Butler is a 58 y.o. with a past medical history significant for orthostatic hypotension, chronic diastolic congestive heart failure, persistent atrial fibrillation on Eliquis, CKD stage IIIa, anemia of chronic disease, severe alcohol use disorder, chronic ulcer of the left foot, prior SAH secondary to aneurysm s/p clipping, alcohol use, and depression who presented to the ED 2/14 from [facility] and rehab with complaints of abdominal pain and swelling with associated nausea and small-volume emesis that began day prior to admission.  States he has felt unwell x 3 days.  On ED arrival patient was seen febrile with temperature 103.4, tachypneic, and tachycardic.  Lab work significant for K2.4, glucose 116, creatinine 3.12, anion gap 26, alkaline phosphate 33, albumin 2.5, lactic 10.3, WBC 19.6.  CT abdomen and pelvis obtained which revealed substantial free air concerning for acute bowel perforation.  Surgery consulted and patient underwent exploratory laparotomy which revealed diffuse colonic distention with ischemia and gangrenous colonic necrosis with scattered areas of perforation and massive flocculent peritonitis.  Postprocedure patient remained intubated and sedated, PCCM consulted for further management admission.  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 OR for subtotal colectomy, intestinal discontinuity  2/15: con't on pressor support, ID consult  2/17: exploratory laparotomy, end ileostomy 2/18: goal to wake up and wean, will need TPN  2/19: waking, gets tachypneic, tachycardic with SBT 2/20: failed SBT. Starting trickle feeds, lasix BID, PO oxy and klonopin  2/21: extubated.  Interim History / Subjective:  Atrial fibrillation remains difficult to control, received Lopressor 5mg  overnight as well as Cardizem 10mg , Dig load(already on Cardizem, Metoprolol, Amio)  Objective   Blood pressure 110/72, pulse (!) 101, temperature 97.9 F (36.6 C), temperature source Axillary, resp. rate 19, height 6\' 1"  (1.854 m), weight 116.4 kg, SpO2 100%.    SpO2: 100 % O2 Flow Rate (L/min): 4 L/min FiO2 (%): 30 %   Intake/Output Summary (Last 24 hours) at 11/04/2023 0909 Last data filed at 11/04/2023 0800 Gross per 24 hour  Intake 1701.76 ml  Output 1810 ml  Net -108.24 ml   Filed Weights   11/02/23 0500 11/03/23 0500 11/04/23 0500  Weight: 116.5 kg 118.3 kg 116.4 kg   Physical Exam: General: acute on chronically ill appearing male, sitting in bed.  HENT: NGT in place. Soft speech.  Respiratory: Clear bilaterally.  No wheezing.  Cardiovascular:  IRIR. No M/R/G GI: soft, midline VAC dressing. Minimal drainage. Ileostomy intact with normal stool output.  Extremities: No edema, no deformities Neuro: awake, no focal deficits. Generalized weakness.  Delirious. GU: Foley catheter in place.  Clear urine.  Ancillary tests personally reviewed  Normal electrolytes and Creatinine 1.11 WBC 22.4 yesterday.   Assessment &  Plan:   Small bowel  perforation s/p ex lap subtotal colectomy 2/15. Re-exploration with creation of end ileostomy and closure 2/17. Gangrenous Colitis  with enterococcus fecalis bacteremia. - CCS following/managing. - Wound vac per CCS. - Continue Zosyn. - Continue TF's per surgery. - If spikes further fevers, will need to repeat CT abd/pelv after discussions with CCS 2/24.  Atrial fibrillation likely 2/2 sepsis, critical illness. Difficult to control. Acute HFpEF. - Continue amio, cardizem, lopressor, dig. - Continue Lovenox in lieu of PTA Eliquis. - Ensure K > 4, Mg > 2. - Supportive care for sepsis as above.  Protein calorie malnutrition. - TF's per nutrition.  Acute kidney injury on chronic kidney disease stage IIIa, improving. - Follow BMP.  Delirium - confounded by prolonged ICU stay, sepsis, possible BDZ withdrawal (abrupt stop). - Continue Clonazepam for now and taper to off, Quetiapine.  Deconditioning. - PT as able.   Best Practice (right click and "Reselect all SmartList Selections" daily)   Diet/type: tubefeeds and clear liquids DVT prophylaxis Full anticoagulation for afib. On Lovenox.  Pressure ulcer(s): N/A GI prophylaxis: PPI Lines: PICC removal ordered.  Foley: External catheter.  Code Status:  full code Last date of multidisciplinary goals of care discussion: updated wife at bedside 2/22  CC time: 30 min.   Rutherford Guys, PA - C [place] [facility] For pager details, please see AMION or use Epic chat  After 1900, please call [facility] for cross coverage needs 11/04/2023, 9:23 AM

## 2023-11-05 ENCOUNTER — Inpatient Hospital Stay (HOSPITAL_COMMUNITY): Payer: Medicaid Other | Admitting: Anesthesiology

## 2023-11-05 ENCOUNTER — Other Ambulatory Visit: Payer: Self-pay

## 2023-11-05 ENCOUNTER — Encounter (HOSPITAL_COMMUNITY): Payer: Self-pay | Admitting: Pulmonary Disease

## 2023-11-05 ENCOUNTER — Inpatient Hospital Stay (HOSPITAL_COMMUNITY): Payer: Medicaid Other

## 2023-11-05 ENCOUNTER — Encounter (HOSPITAL_COMMUNITY)
Admission: EM | Disposition: A | Discharge status: Hospice | Payer: Self-pay | Source: Home / Self Care | Attending: Pulmonary Disease

## 2023-11-05 DIAGNOSIS — N179 Acute kidney failure, unspecified: Secondary | ICD-10-CM | POA: Diagnosis not present

## 2023-11-05 DIAGNOSIS — I4891 Unspecified atrial fibrillation: Secondary | ICD-10-CM

## 2023-11-05 DIAGNOSIS — I110 Hypertensive heart disease with heart failure: Secondary | ICD-10-CM

## 2023-11-05 DIAGNOSIS — I509 Heart failure, unspecified: Secondary | ICD-10-CM

## 2023-11-05 DIAGNOSIS — K631 Perforation of intestine (nontraumatic): Secondary | ICD-10-CM

## 2023-11-05 DIAGNOSIS — F418 Other specified anxiety disorders: Secondary | ICD-10-CM

## 2023-11-05 DIAGNOSIS — E46 Unspecified protein-calorie malnutrition: Secondary | ICD-10-CM | POA: Diagnosis not present

## 2023-11-05 HISTORY — PX: LAPAROTOMY: SHX154

## 2023-11-05 LAB — BASIC METABOLIC PANEL
Anion gap: 12 (ref 5–15)
Anion gap: 10 (ref 5–15)
BUN: 64 mg/dL — ABNORMAL HIGH (ref 6–20)
BUN: 71 mg/dL — ABNORMAL HIGH (ref 6–20)
CO2: 21 mmol/L — ABNORMAL LOW (ref 22–32)
CO2: 23 mmol/L (ref 22–32)
Calcium: 8.1 mg/dL — ABNORMAL LOW (ref 8.9–10.3)
Calcium: 7.8 mg/dL — ABNORMAL LOW (ref 8.9–10.3)
Chloride: 107 mmol/L (ref 98–111)
Chloride: 104 mmol/L (ref 98–111)
Creatinine, Ser: 1.88 mg/dL — ABNORMAL HIGH (ref 0.61–1.24)
Creatinine, Ser: 1.96 mg/dL — ABNORMAL HIGH (ref 0.61–1.24)
GFR, Estimated: 41 mL/min — ABNORMAL LOW (ref >60)
GFR, Estimated: 39 mL/min — ABNORMAL LOW (ref >60)
Glucose, Bld: 121 mg/dL — ABNORMAL HIGH (ref 70–99)
Glucose, Bld: 118 mg/dL — ABNORMAL HIGH (ref 70–99)
Potassium: 5.1 mmol/L (ref 3.5–5.1)
Potassium: 4.5 mmol/L (ref 3.5–5.1)
Sodium: 140 mmol/L (ref 135–145)
Sodium: 137 mmol/L (ref 135–145)

## 2023-11-05 LAB — POCT I-STAT 7, (LYTES, BLD GAS, ICA,H+H)
Acid-base deficit: 1 mmol/L (ref 0.0–2.0)
Bicarbonate: 25.1 mmol/L (ref 20.0–28.0)
Calcium, Ion: 1.17 mmol/L (ref 1.15–1.40)
HCT: 26 % — ABNORMAL LOW (ref 39.0–52.0)
Hemoglobin: 8.8 g/dL — ABNORMAL LOW (ref 13.0–17.0)
O2 Saturation: 100 %
Patient temperature: 36.3
Potassium: 4.5 mmol/L (ref 3.5–5.1)
Sodium: 137 mmol/L (ref 135–145)
TCO2: 27 mmol/L (ref 22–32)
pCO2 arterial: 48.1 mm[Hg] — ABNORMAL HIGH (ref 32–48)
pH, Arterial: 7.322 — ABNORMAL LOW (ref 7.35–7.45)
pO2, Arterial: 194 mm[Hg] — ABNORMAL HIGH (ref 83–108)

## 2023-11-05 LAB — GLUCOSE, CAPILLARY
Glucose-Capillary: 110 mg/dL — ABNORMAL HIGH (ref 70–99)
Glucose-Capillary: 118 mg/dL — ABNORMAL HIGH (ref 70–99)
Glucose-Capillary: 132 mg/dL — ABNORMAL HIGH (ref 70–99)
Glucose-Capillary: 136 mg/dL — ABNORMAL HIGH (ref 70–99)
Glucose-Capillary: 96 mg/dL (ref 70–99)
Glucose-Capillary: 99 mg/dL (ref 70–99)

## 2023-11-05 LAB — CBC
HCT: 30.5 % — ABNORMAL LOW (ref 39.0–52.0)
Hemoglobin: 9.4 g/dL — ABNORMAL LOW (ref 13.0–17.0)
MCH: 26.3 pg (ref 26.0–34.0)
MCHC: 30.8 g/dL (ref 30.0–36.0)
MCV: 85.2 fL (ref 80.0–100.0)
Platelets: 418 10*3/uL — ABNORMAL HIGH (ref 150–400)
RBC: 3.58 MIL/uL — ABNORMAL LOW (ref 4.22–5.81)
RDW: 20.4 % — ABNORMAL HIGH (ref 11.5–15.5)
WBC: 19 10*3/uL — ABNORMAL HIGH (ref 4.0–10.5)
nRBC: 0.7 % — ABNORMAL HIGH (ref 0.0–0.2)

## 2023-11-05 LAB — MAGNESIUM: Magnesium: 2.1 mg/dL (ref 1.7–2.4)

## 2023-11-05 LAB — PHOSPHORUS: Phosphorus: 3.6 mg/dL (ref 2.5–4.6)

## 2023-11-05 SURGERY — LAPAROTOMY, EXPLORATORY
Anesthesia: General | Site: Abdomen

## 2023-11-05 MED ORDER — PHENYLEPHRINE 80 MCG/ML (10ML) SYRINGE FOR IV PUSH (FOR BLOOD PRESSURE SUPPORT)
160.0000 ug | PREFILLED_SYRINGE | Freq: Once | INTRAVENOUS | Status: AC
  Administered 2023-11-05: 160 ug via INTRAVENOUS

## 2023-11-05 MED ORDER — ROCURONIUM BROMIDE 100 MG/10ML IV SOLN
INTRAVENOUS | Status: DC | PRN
  Administered 2023-11-05: 80 mg via INTRAVENOUS
  Administered 2023-11-05: 30 mg via INTRAVENOUS
  Administered 2023-11-05: 20 mg via INTRAVENOUS

## 2023-11-05 MED ORDER — MIDAZOLAM HCL 2 MG/2ML IJ SOLN
INTRAMUSCULAR | Status: AC
  Filled 2023-11-05: qty 2

## 2023-11-05 MED ORDER — IOHEXOL 350 MG/ML SOLN
58.0000 mL | Freq: Once | INTRAVENOUS | Status: AC | PRN
  Administered 2023-11-05: 58 mL via INTRAVENOUS

## 2023-11-05 MED ORDER — LACTATED RINGERS IV BOLUS
500.0000 mL | Freq: Once | INTRAVENOUS | Status: AC
  Administered 2023-11-05: 500 mL via INTRAVENOUS

## 2023-11-05 MED ORDER — FENTANYL CITRATE PF 50 MCG/ML IJ SOSY
50.0000 ug | PREFILLED_SYRINGE | INTRAMUSCULAR | Status: AC | PRN
  Administered 2023-11-07 – 2023-11-08 (×3): 50 ug via INTRAVENOUS

## 2023-11-05 MED ORDER — FENTANYL 2500MCG IN NS 250ML (10MCG/ML) PREMIX INFUSION
0.0000 ug/h | INTRAVENOUS | Status: DC
  Administered 2023-11-05 – 2023-11-08 (×3): 50 ug/h via INTRAVENOUS
  Filled 2023-11-05 (×3): qty 250

## 2023-11-05 MED ORDER — FENTANYL CITRATE PF 50 MCG/ML IJ SOSY
50.0000 ug | PREFILLED_SYRINGE | Freq: Once | INTRAMUSCULAR | Status: AC
  Administered 2023-11-05: 50 ug via INTRAVENOUS

## 2023-11-05 MED ORDER — AMIODARONE HCL IN DEXTROSE 360-4.14 MG/200ML-% IV SOLN
60.0000 mg/h | INTRAVENOUS | Status: AC
Start: 2023-11-05 — End: 2023-11-06

## 2023-11-05 MED ORDER — ROCURONIUM BROMIDE 10 MG/ML (PF) SYRINGE
100.0000 mg | PREFILLED_SYRINGE | Freq: Once | INTRAVENOUS | Status: AC
  Administered 2023-11-05: 100 mg via INTRAVENOUS

## 2023-11-05 MED ORDER — NOREPINEPHRINE 4 MG/250ML-% IV SOLN
INTRAVENOUS | Status: DC | PRN
  Administered 2023-11-05: 2 ug/min via INTRAVENOUS

## 2023-11-05 MED ORDER — CEFAZOLIN SODIUM-DEXTROSE 2-3 GM-%(50ML) IV SOLR
INTRAVENOUS | Status: DC | PRN
  Administered 2023-11-05: 2 g via INTRAVENOUS

## 2023-11-05 MED ORDER — 0.9 % SODIUM CHLORIDE (POUR BTL) OPTIME
TOPICAL | Status: DC | PRN
  Administered 2023-11-05: 12000 mL

## 2023-11-05 MED ORDER — SODIUM CHLORIDE 0.9 % IV SOLN: INTRAVENOUS | Status: AC | PRN

## 2023-11-05 MED ORDER — FENTANYL CITRATE PF 50 MCG/ML IJ SOSY
50.0000 ug | PREFILLED_SYRINGE | INTRAMUSCULAR | Status: DC | PRN
  Administered 2023-11-09 – 2023-11-11 (×3): 100 ug via INTRAVENOUS
  Filled 2023-11-05 (×3): qty 2

## 2023-11-05 MED ORDER — VASOPRESSIN 20 UNITS/100 ML INFUSION FOR SHOCK
0.0000 [IU]/min | INTRAVENOUS | Status: DC
  Administered 2023-11-05 – 2023-11-09 (×9): 0.03 [IU]/min via INTRAVENOUS
  Filled 2023-11-05 (×9): qty 100

## 2023-11-05 MED ORDER — METOPROLOL TARTRATE 50 MG PO TABS
50.0000 mg | ORAL_TABLET | Freq: Two times a day (BID) | ORAL | Status: DC
  Administered 2023-11-05: 50 mg
  Filled 2023-11-05: qty 1

## 2023-11-05 MED ORDER — DOCUSATE SODIUM 50 MG/5ML PO LIQD
100.0000 mg | Freq: Two times a day (BID) | ORAL | Status: DC
  Administered 2023-11-06 – 2023-11-08 (×4): 100 mg
  Filled 2023-11-05 (×4): qty 10

## 2023-11-05 MED ORDER — ALBUMIN HUMAN 5 % IV SOLN: INTRAVENOUS | Status: DC | PRN

## 2023-11-05 MED ORDER — PROPOFOL 10 MG/ML IV BOLUS
INTRAVENOUS | Status: AC
  Filled 2023-11-05: qty 20

## 2023-11-05 MED ORDER — ETOMIDATE 2 MG/ML IV SOLN
10.0000 mg | Freq: Once | INTRAVENOUS | Status: AC
  Administered 2023-11-05: 10 mg via INTRAVENOUS

## 2023-11-05 MED ORDER — FREE WATER
200.0000 mL | Freq: Four times a day (QID) | Status: DC
  Administered 2023-11-05 – 2023-11-08 (×4): 200 mL

## 2023-11-05 MED ORDER — VASOPRESSIN 20 UNIT/ML IV SOLN
INTRAVENOUS | Status: AC
  Filled 2023-11-05: qty 1

## 2023-11-05 MED ORDER — MIDODRINE HCL 5 MG PO TABS: 15.0000 mg | ORAL_TABLET | Freq: Three times a day (TID) | ORAL | Status: DC

## 2023-11-05 MED ORDER — MIDODRINE HCL 5 MG PO TABS
20.0000 mg | ORAL_TABLET | Freq: Once | ORAL | Status: AC
  Administered 2023-11-05: 20 mg via ORAL

## 2023-11-05 MED ORDER — SODIUM CHLORIDE 0.9 % IV SOLN: INTRAVENOUS | Status: DC | PRN

## 2023-11-05 MED ORDER — LACTATED RINGERS IV BOLUS
1000.0000 mL | Freq: Once | INTRAVENOUS | Status: AC
  Administered 2023-11-05: 1000 mL via INTRAVENOUS

## 2023-11-05 MED ORDER — VASOPRESSIN 20 UNIT/ML IV SOLN
INTRAVENOUS | Status: DC | PRN
  Administered 2023-11-05 (×3): 3 [IU] via INTRAVENOUS
  Administered 2023-11-05: 2 [IU] via INTRAVENOUS

## 2023-11-05 MED ORDER — PROPOFOL 500 MG/50ML IV EMUL
INTRAVENOUS | Status: DC | PRN
  Administered 2023-11-05: 25 ug/kg/min via INTRAVENOUS

## 2023-11-05 MED ORDER — POLYETHYLENE GLYCOL 3350 17 G PO PACK
17.0000 g | PACK | Freq: Every day | ORAL | Status: DC
  Administered 2023-11-06: 17 g
  Filled 2023-11-05: qty 1

## 2023-11-05 MED ORDER — SODIUM CHLORIDE 0.9 % IV SOLN: 250.0000 mL | INTRAVENOUS | Status: AC

## 2023-11-05 MED ORDER — IOHEXOL 9 MG/ML PO SOLN
500.0000 mL | ORAL | Status: AC
Start: 2023-11-05 — End: 2023-11-05
  Administered 2023-11-05 (×2): 500 mL via ORAL

## 2023-11-05 MED ORDER — LACTATED RINGERS IV SOLN: INTRAVENOUS | Status: AC

## 2023-11-05 MED ORDER — AMIODARONE HCL IN DEXTROSE 360-4.14 MG/200ML-% IV SOLN: 30.0000 mg/h | INTRAVENOUS | Status: DC

## 2023-11-05 MED ORDER — MIDAZOLAM HCL 2 MG/2ML IJ SOLN
1.0000 mg | INTRAMUSCULAR | Status: DC | PRN
  Administered 2023-11-10: 2 mg via INTRAVENOUS
  Filled 2023-11-05: qty 2

## 2023-11-05 MED ORDER — PROPOFOL 10 MG/ML IV BOLUS
INTRAVENOUS | Status: DC | PRN
  Administered 2023-11-05: 50 mg via INTRAVENOUS

## 2023-11-05 MED ORDER — MIDAZOLAM HCL 5 MG/5ML IJ SOLN
INTRAMUSCULAR | Status: DC | PRN
  Administered 2023-11-05: 4 mg via INTRAVENOUS

## 2023-11-05 MED ORDER — NOREPINEPHRINE 4 MG/250ML-% IV SOLN
0.0000 ug/min | INTRAVENOUS | Status: DC
  Administered 2023-11-06: 8 ug/min via INTRAVENOUS
  Administered 2023-11-08: 2 ug/min via INTRAVENOUS
  Filled 2023-11-05 (×3): qty 250

## 2023-11-05 MED ORDER — PHENYLEPHRINE HCL-NACL 20-0.9 MG/250ML-% IV SOLN
25.0000 ug/min | INTRAVENOUS | Status: DC
  Administered 2023-11-05: 40 ug/min via INTRAVENOUS
  Administered 2023-11-06: 110 ug/min via INTRAVENOUS
  Administered 2023-11-06: 80 ug/min via INTRAVENOUS
  Administered 2023-11-06: 170 ug/min via INTRAVENOUS
  Administered 2023-11-06: 160 ug/min via INTRAVENOUS
  Administered 2023-11-06: 170 ug/min via INTRAVENOUS
  Administered 2023-11-06: 200 ug/min via INTRAVENOUS
  Administered 2023-11-06 (×2): 170 ug/min via INTRAVENOUS
  Administered 2023-11-07: 120 ug/min via INTRAVENOUS
  Administered 2023-11-07 (×3): 170 ug/min via INTRAVENOUS
  Administered 2023-11-07: 40 ug/min via INTRAVENOUS
  Administered 2023-11-07: 150 ug/min via INTRAVENOUS
  Administered 2023-11-07: 180 ug/min via INTRAVENOUS
  Administered 2023-11-08: 70 ug/min via INTRAVENOUS
  Administered 2023-11-08: 100 ug/min via INTRAVENOUS
  Filled 2023-11-05 (×2): qty 500
  Filled 2023-11-05: qty 250
  Filled 2023-11-05: qty 500
  Filled 2023-11-05 (×2): qty 250
  Filled 2023-11-05: qty 500
  Filled 2023-11-05 (×2): qty 250
  Filled 2023-11-05: qty 500
  Filled 2023-11-05 (×3): qty 250

## 2023-11-05 MED ORDER — VANCOMYCIN HCL 1250 MG/250ML IV SOLN
1250.0000 mg | INTRAVENOUS | Status: DC
  Administered 2023-11-05 – 2023-11-07 (×2): 1250 mg via INTRAVENOUS
  Filled 2023-11-05 (×2): qty 250

## 2023-11-05 MED ORDER — FENTANYL CITRATE (PF) 250 MCG/5ML IJ SOLN
INTRAMUSCULAR | Status: AC
  Filled 2023-11-05: qty 5

## 2023-11-05 SURGICAL SUPPLY — 45 items
BAG COUNTER SPONGE SURGICOUNT (BAG) ×1 IMPLANT
BENZOIN TINCTURE PRP APPL 2/3 (GAUZE/BANDAGES/DRESSINGS) IMPLANT
BLADE CLIPPER SURG (BLADE) IMPLANT
CANISTER SUCT 3000ML PPV (MISCELLANEOUS) ×1 IMPLANT
CANISTER WOUND CARE 500ML ATS (WOUND CARE) IMPLANT
CHLORAPREP W/TINT 26 (MISCELLANEOUS) ×1 IMPLANT
CNTNR URN SCR LID CUP LEK RST (MISCELLANEOUS) IMPLANT
COVER SURGICAL LIGHT HANDLE (MISCELLANEOUS) ×1 IMPLANT
DRAPE LAPAROSCOPIC ABDOMINAL (DRAPES) ×1 IMPLANT
DRAPE WARM FLUID 44X44 (DRAPES) ×1 IMPLANT
DRSG OPSITE POSTOP 4X10 (GAUZE/BANDAGES/DRESSINGS) IMPLANT
DRSG OPSITE POSTOP 4X8 (GAUZE/BANDAGES/DRESSINGS) IMPLANT
ELECT BLADE 6.5 EXT (BLADE) IMPLANT
ELECT CAUTERY BLADE 6.4 (BLADE) ×1 IMPLANT
ELECT REM PT RETURN 9FT ADLT (ELECTROSURGICAL) ×1 IMPLANT
ELECTRODE REM PT RTRN 9FT ADLT (ELECTROSURGICAL) ×1 IMPLANT
GLOVE BIOGEL PI MICRO STRL 6 (GLOVE) ×1 IMPLANT
GLOVE INDICATOR 6.5 STRL GRN (GLOVE) ×1 IMPLANT
GOWN STRL REUS W/ TWL LRG LVL3 (GOWN DISPOSABLE) ×2 IMPLANT
HANDLE SUCTION POOLE (INSTRUMENTS) ×1 IMPLANT
KIT BASIN OR (CUSTOM PROCEDURE TRAY) ×1 IMPLANT
KIT OSTOMY DRAINABLE 2.75 STR (WOUND CARE) IMPLANT
KIT TURNOVER KIT B (KITS) ×1 IMPLANT
LIGASURE IMPACT 36 18CM CVD LR (INSTRUMENTS) IMPLANT
NS IRRIG 1000ML POUR BTL (IV SOLUTION) ×2 IMPLANT
PACK GENERAL/GYN (CUSTOM PROCEDURE TRAY) ×1 IMPLANT
PAD ARMBOARD 7.5X6 YLW CONV (MISCELLANEOUS) ×1 IMPLANT
PENCIL SMOKE EVACUATOR (MISCELLANEOUS) ×1 IMPLANT
RELOAD PROXIMATE 75MM BLUE (ENDOMECHANICALS) ×1 IMPLANT
RELOAD STAPLE 75 3.8 BLU REG (ENDOMECHANICALS) IMPLANT
SPECIMEN JAR LARGE (MISCELLANEOUS) IMPLANT
SPONGE ABD ABTHERA ADVANCE (MISCELLANEOUS) IMPLANT
SPONGE T-LAP 18X18 ~~LOC~~+RFID (SPONGE) IMPLANT
STAPLER PROXIMATE 75MM BLUE (STAPLE) IMPLANT
STAPLER VISISTAT 35W (STAPLE) ×1 IMPLANT
SUCTION POOLE HANDLE (INSTRUMENTS) ×3 IMPLANT
SUT PDS AB 1 TP1 96 (SUTURE) ×2 IMPLANT
SUT PROLENE 2 0 CT2 30 (SUTURE) IMPLANT
SUT VIC AB 2-0 SH 18 (SUTURE) ×1 IMPLANT
SUT VIC AB 3-0 SH 18 (SUTURE) ×1 IMPLANT
SUT VICRYL AB 2 0 TIES (SUTURE) ×1 IMPLANT
SUT VICRYL AB 3 0 TIES (SUTURE) ×1 IMPLANT
TOWEL GREEN STERILE (TOWEL DISPOSABLE) ×1 IMPLANT
TRAY FOLEY MTR SLVR 16FR STAT (SET/KITS/TRAYS/PACK) IMPLANT
TUBE CONNECTING 12X1/4 (SUCTIONS) IMPLANT

## 2023-11-05 NOTE — Transfer of Care (Signed)
 Immediate Anesthesia Transfer of Care Note  Patient: Daniel Butler  Procedure(s) Performed: EXPLORATORY LAPAROTOMY (Abdomen)  Patient Location: ICU  Anesthesia Type:General  Level of Consciousness: Patient remains intubated per anesthesia plan  Airway & Oxygen Therapy: Patient remains intubated per anesthesia plan and Patient placed on Ventilator (see vital sign flow sheet for setting)  Post-op Assessment: Report given to RN and Post -op Vital signs reviewed and stable  Post vital signs: Reviewed and stable  Last Vitals:  Vitals Value Taken Time  BP 82/70 11/05/23 2319  Temp    Pulse 80 11/05/23 2332  Resp 18 11/05/23 2332  SpO2 97 % 11/05/23 2332  Vitals shown include unfiled device data.  Last Pain:  Vitals:   11/05/23 2000  TempSrc: Axillary  PainSc:          Complications: No notable events documented.

## 2023-11-05 NOTE — Progress Notes (Signed)
 NAME:  EDOUARD GIKAS, MRN:  [id number], DOB:  Jun 28, 1966, LOS: 12 ADMISSION DATE:  10/24/2023, CONSULTATION DATE:  10/24/2023 REFERRING MD:  Dr. Bedelia Person - CCS, CHIEF COMPLAINT:  Bowel perf   History of Present Illness:  JUDY GOODENOW is a 58 y.o. 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  States he has felt unwell x 3 days.  On ED arrival patient was seen febrile with temperature 103.4, tachypneic, and tachycardic.  Lab work significant for K2.4, glucose 116, creatinine 3.12, anion gap 26, alkaline phosphate 33, albumin 2.5, lactic 10.3, WBC 19.6.  CT abdomen and pelvis obtained which revealed substantial free air concerning for acute bowel perforation.  Surgery consulted and patient underwent exploratory laparotomy which revealed diffuse colonic distention with ischemia and gangrenous colonic necrosis with scattered areas of perforation and massive flocculent peritonitis.  Postprocedure patient remained intubated and sedated, PCCM consulted for further management admission.  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 OR for subtotal colectomy, intestinal discontinuity  2/15: con't on pressor support, ID consult  2/17: exploratory laparotomy, end ileostomy 2/18: goal to wake up and wean, will need TPN  2/19: waking, gets tachypneic, tachycardic with SBT 2/20: failed SBT. Starting trickle feeds, lasix BID, PO oxy and klonopin  2/21: extubated. 2/26 fluid bolus and free water per tube added  Interim History / Subjective:  Remains in A.fib. Rate controlled 90 - 100. SCr up. Delirium slightly better, he is A&O x 3.  Objective   Blood pressure (!) 102/58, pulse (!) 106, temperature 98.9 F (37.2 C), temperature source Axillary, resp. rate (!) 24, height 6\' 1"  (1.854 m), weight 117.9 kg, SpO2 95%.    SpO2: 95 % O2 Flow Rate (L/min): 1 L/min FiO2 (%): 30 %   Intake/Output Summary (Last 24 hours) at 11/05/2023 0924 Last data filed at 11/05/2023 0700 Gross per 24 hour  Intake 1680.19 ml  Output 1125 ml  Net 555.19 ml   Filed Weights   11/03/23 0500 11/04/23 0500 11/05/23 0500  Weight: 118.3 kg 116.4 kg 117.9 kg   Physical Exam: General: acute on chronically ill appearing male, sitting in bed.  HENT: NGT in place. Soft speech.  Respiratory: Clear bilaterally.  No wheezing.  Cardiovascular:  IRIR. No M/R/G GI: soft, midline VAC dressing. Minimal drainage. Ileostomy intact with normal stool output.  Extremities: No edema, no deformities Neuro: awake, no focal deficits. Generalized weakness.  Delirious though improved somewhat.  Ancillary tests personally reviewed  Normal electrolytes and Creatinine 1.11 WBC 22.4 yesterday.  Assessment & Plan:   Small bowel perforation s/p ex lap  subtotal colectomy 2/15. Re-exploration with creation of end ileostomy and closure 2/17. Gangrenous Colitis  with enterococcus fecalis bacteremia. - CCS following/managing. - Wound vac per CCS. - Continue Zosyn. - Continue TF's per surgery. - If spikes further fevers or WBC significantly rises, will need to repeat CT abd/pelv after discussions with CCS 2/24. Holding off for now.  Atrial fibrillation likely 2/2 sepsis, critical illness. Difficult to control. Acute HFpEF. - Continue cardizem, lopressor, dig. - d/c amio 2/25 - Continue Lovenox in lieu of PTA Eliquis. - Ensure K > 4, Mg > 2. - 500LR bolus today - Supportive care for sepsis as above.  Protein calorie malnutrition. - TF's per nutrition. - Diet when OK with surgery.  Acute kidney injury on chronic kidney disease stage IIIa - slightly worse 2/26. Has fair UOP and ileostomy output. - 500LR bolus now then start free water per tube to makeup for losses from ileostomy output. - Might need some gentle MIVF. - Follow BMP this PM and in AM.  Delirium - confounded by prolonged ICU stay, sepsis, possible BDZ withdrawal (abrupt stop). Improving 2/26. - Continue Clonazepam with taper to off. - Continue Quetiapine.  Deconditioning. - PT as able.   Watch in ICU this morning. If HR remains controlled, can transfer to progressive and will ask TRH to assume care AM 2/27.   Best Practice (right click and "Reselect all SmartList Selections" daily)   Diet/type: tubefeeds DVT prophylaxis Full anticoagulation for afib. On Lovenox.  Pressure ulcer(s): N/A GI prophylaxis: PPI Lines: None Foley: External catheter.  Code Status:  full code Last date of multidisciplinary goals of care discussion: updated wife at bedside 2/22   Rutherford Guys, [place] - C [place] [facility] For pager details, please see AMION or use Epic chat  After 1900, please call [facility] for cross coverage needs 11/05/2023, 9:24 AM

## 2023-11-05 NOTE — Progress Notes (Signed)
 Called to bedside due to concern for aspiration after CT scan and large volume output from wound vac which was significantly increased from baseline. CCM at bedside and intubated patient. Reviewed CT scan which was concerning for perforated viscus. Discussed with patient's wife at bedside, [name].   We discussed that without exploratory laparotomy, wound not be able to identify source of perforation or repair nonoperatively. Without an operation patient would become increasingly septic and would not survive. With an operation he will likely still become more critically ill given his perforation but will be able to identify source. Once perforation identified patient may require bowel resection. Depending on intraoperative findings, patient may need to have an abthera placed for a second look in coming days. We discussed risks of bleeding, damage to surrounding structures and need for reoperation, possibility of needing revision of ostomy. [name] voiced understanding of the operation, plan, and risks involved and wishes to proceed with surgery at this time.  Donata Duff, MD [facility]

## 2023-11-05 NOTE — Progress Notes (Signed)
 Pharmacy Antibiotic Note  Daniel Butler is a 58 y.o. male admitted on 10/24/2023 with perforated vsicus with multyiple abscess in setting of E. Faecalis bacteremia (pan sensitive).  Pharmacy has been consulted for vancomycin dosing.  -Underwent exploratory laparotomy and found to have perforated sigmoid colon and multiple pockets of abscess now being drained w/ infected/necrotic appearing fascia.   Plan: -Zosyn 3.375gm IV every 8 hours per MD -Vancomycin 1250mg  IV every 24 hours (AUC 493, Vd 0.5, IBW) -Monitor renal function -Follow up signs of clinical improvement, LOT, de-escalation of antibiotics   Height: 6\' 1"  (185.4 cm) Weight: 117.9 kg (259 lb 14.8 oz) IBW/kg (Calculated) : 79.9  Temp (24hrs), Avg:98.9 F (37.2 C), Min:97.3 F (36.3 C), Max:100 F (37.8 C)  Recent Labs  Lab 10/30/23 0755 10/31/23 1415 11/02/23 0453 11/02/23 0730 11/02/23 0955 11/03/23 0436 11/03/23 0817 11/04/23 0818 11/05/23 0633 11/05/23 1444  WBC 15.7*  --  22.4*  --   --   --  21.6* 16.3* 19.0*  --   CREATININE  --    < >  --  1.12 1.11 1.26*  --   --  1.88* 1.96*   < > = values in this interval not displayed.    Estimated Creatinine Clearance: 55.3 mL/min (A) (by C-G formula based on SCr of 1.96 mg/dL (H)).    Allergies  Allergen Reactions   Zetia [Ezetimibe] Nausea And Vomiting    Antimicrobials this admission: Zosyn 2/15 >>  Vancomycin 2/14, 2/26 >>    Microbiology results: 2/14 BCx: E. Faecalis (pan sensitive) 2/15 repeat BCx: NG   Thank you for allowing pharmacy to be a part of this patient's care.  Arabella Merles, PharmD. Clinical Pharmacist 11/05/2023 11:49 PM

## 2023-11-05 NOTE — TOC CM/SW Note (Signed)
 Transition of Care [facility]) - Inpatient Brief Assessment   Patient Details  Name: BEAU RAMSBURG MRN: [id number] Date of Birth: 1966/06/24  Transition of Care [facility]) CM/SW Contact:    Mearl Latin, LCSW Phone Number: 11/05/2023, 5:02 PM   Clinical Narrative: TOC continuing to follow for medical progression.    Transition of Care Asessment: Insurance and Status: Insurance coverage has been reviewed Patient has primary care physician: Yes Ria Clock) Home environment has been reviewed: Facility resident; [facility] Prior level of function:: Unknown Prior/Current Home Services: No current home services Social Drivers of Health Review: SDOH reviewed needs interventions Readmission risk has been reviewed: Yes Transition of care needs: transition of care needs identified, TOC will continue to follow

## 2023-11-05 NOTE — Op Note (Signed)
 EXPLORATORY LAPAROTOMY  Operative Note (CSN: [id number])  Service  Date of Surgery: 11/05/2023 Admit Date: 10/24/2023 Performing Service: General Surgeons and Role:    Azucena Cecil, Benetta Spar, MD - Primary  Op Note Pre-op Diagnosis: perforated viscus Post-op Diagnosis: perforated sigmoid stump  Procedure(s): 1.) Exploratory laparotomy 2.) Lysis of adhesions > 60 minutes 3.) Partial sigmoid colon resection without anastomosis 4.) Excision of necrotic appearing fascia and subcutaneous tissue 2cm x 10cm  Findings: Perforation of sigmoid colon near staple line, friable tissue. Multiple pockets of abscess encountered and drained. Infected/necrotic appearing fascia, subcutaneous tissue along previous laparotomy incision that was excised.  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  Procedure Details  Prior to the procedure, the risks, benefits, complications, treatment options, and expected outcomes were discussed with the patient and/or family, including but not limited to, the risks of bleeding, infection, and possible bowel resection, possible abthera placement.  Despite the risks, the wife, [name], has given informed consent for operative intervention.  The patient was taken to the Operating Room, identified as Daniel Butler and the procedure verified as exploratory laparotomy, possible bowel resection, possible temporary abdominal closure device, and all other indicated procedures..  Identification pause was held and the above information confirmed.  The patient was placed in the supine  position, patient already intubated. The ostomy appliance was removed and area prepped with betadine. Sterile gauze and tegaderm placed over ostomy. Wound vac then removed and entire abdomen was prepped using betadine including over tegaderm placed on ostomy and draped in the typical sterile fashion.  A formal preincision time out was performed.  The previous midline laparotomy incision was opened. The tissue here easily fell apart, previous suture was removed. We encountered amount of stool. Small bowel was inflamed and extensive adhesions present that took >43min to lyse.  Multiple abscess pockets were identified and drained. Approximately 4.6L of stool and pus were drained from abdomen. We then ran the bowel from LoT to ileostomy x 2 and no signs of perforation noted. Many areas of small bowel covered in rind but no perforation seen when interrogated. We then mobilized the colonic stump and noted a perforation at this point. The stump was quite long and was able to resect colon to point distal to perforation with load of blue GIA stapler 75.  This area was oversewn with 2-0 silk. Tags left on either end of stump with 2-0 prolene.   Attention then turned to abdominal wall. I excised approximately one centimeter on either side of fascia and subcutaneous tissue for a length of about 10cm. Remaining tissue appeared viable.  We then irrigated with several liters of warm saline.  Given emergent nature of case and necrotic abdominal while requiring excision and multiple pressors during operation, elected to place abthera wound vac and plan for return to OR in 24-48h.  Instrument, sponge, and needle counts were correct at the conclusion of the case.   CASE DATA: Type of patient?: DOW CASE (Surgical Hospitalist [facility]) Status of Case? EMERGENT Add On Infection Present At Time Of Surgery (PATOS)?  FECULENT PERITONITIS   Post-Op Plan: - Continue OGT to  LIWS, positioning confirmed in OR - NPO, ok to  use OGT for meds only, hold tube feeds. - Abthera to continuous suction 125 - Vanc/Zosyn - Tentatively plan for takeback on Friday - Wife, [name], called and updated   Lysle Rubens, MD Date: 11/05/2023  Time: 11:24 PM

## 2023-11-05 NOTE — Procedures (Signed)
 Central Venous Catheter Insertion Procedure Note  KESHAN REHA  [id number]  December 16, 1965  Date:11/05/23  Time:7:01 PM   Provider Performing:Bart Ashford   Procedure: Insertion of Non-tunneled Central Venous Catheter(36556) with US guidance (28413)   Indication(s) Medication administration and Difficult access  Consent Unable to obtain consent due to emergent nature of procedure.  Anesthesia none  Timeout Verified patient identification, verified procedure, site/side was marked, verified correct patient position, special equipment/implants available, medications/allergies/relevant history reviewed, required imaging and test results available.  Sterile Technique Maximal sterile technique including full sterile barrier drape, hand hygiene, sterile gown, sterile gloves, mask, hair covering, sterile ultrasound probe cover (if used).  Procedure Description Area of catheter insertion was cleaned with chlorhexidine and draped in sterile fashion.  With real-time ultrasound guidance a central venous catheter was placed into the left internal jugular vein. Nonpulsatile blood flow and easy flushing noted in all ports.  The catheter was sutured in place and sterile dressing applied.  Complications/Tolerance None; patient tolerated the procedure well. Chest X-ray is ordered to verify placement for internal jugular or subclavian cannulation.   Chest x-ray is not ordered for femoral cannulation.  EBL Minimal  Specimen(s) None   Lynnell Catalan, MD [facility] ICU Physician [facility]  Critical Care  Pager: [phone] Or Epic Secure Chat After hours: [phone].  11/05/2023, 7:01 PM

## 2023-11-05 NOTE — Progress Notes (Signed)
 Patient ID: Daniel Butler, male   DOB: Jan 13, 1966, 58 y.o.   MRN: [id number] Days Post-Op    Subjective: More drowsy and mentation worse this am than yesterday. He complains of diffuse abdominal pain all over and worse from yesterday. Denies nausea  Wife at bedside  Objective: Vital signs in last 24 hours: Temp:  [98.6 F (37 C)-100 F (37.8 C)] 98.9 F (37.2 C) (02/26 0800) Pulse Rate:  [80-142] 106 (02/26 0900) Resp:  [19-33] 24 (02/26 0900) BP: (81-148)/(53-124) 102/58 (02/26 0900) SpO2:  [92 %-100 %] 95 % (02/26 0900) Weight:  [117.9 kg] 117.9 kg (02/26 0500) Last BM Date : 11/05/23  Intake/Output from previous day: 02/25 0701 - 02/26 0700 In: 1769.3 [I.V.:88.5; NG/GT:1540; IV Piggyback:140.8] Out: 1325 [Urine:775; Drains:5; Stool:545] Intake/Output this shift: No intake/output data recorded.  PE: General: ill appearing male who is laying in bed Heart: not tachycardic at time of my exam Lungs: Respiratory effort nonlabored on room air Abd: soft, ND, TTP diffusely but no rebound or guarding. cortrak in place with Tfs running  Ostomy with stool. Midline wound with vac with good suction Skin: warm and dry  Psych: Alert and oriented x3     Lab Results: CBC  Recent Labs    11/04/23 0818 11/05/23 0633  WBC 16.3* 19.0*  HGB 8.5* 9.4*  HCT 27.5* 30.5*  PLT 336 418*   BMET Recent Labs    11/03/23 0436 11/05/23 0633  NA 138 140  K 3.8 5.1  CL 105 107  CO2 27 21*  GLUCOSE 122* 121*  BUN 44* 64*  CREATININE 1.26* 1.88*  CALCIUM 7.8* 8.1*   PT/INR No results for input(s): "LABPROT", "INR" in the last 72 hours. ABG Recent Labs    11/03/23 0518  HCO3 25.4    Studies/Results: DG Abd Portable 1V Result Date: 11/03/2023 CLINICAL DATA:  Feeding tube placement EXAM: PORTABLE ABDOMEN - 1 VIEW limited for tube placement COMPARISON:  X-ray 11/02/2023 FINDINGS: Limited x-ray of the upper abdomen for tube placement has a Dobbhoff tube with tip overlying  the epigastric region in the midline, likely distal stomach. Gas in the stomach otherwise. Overlapping artifacts. IMPRESSION: Limited x-ray for tube placement has feeding tube overlying the extreme distal stomach. Electronically Signed   By: Karen Kays M.D.   On: 11/03/2023 16:38    Anti-infectives: Anti-infectives (From admission, onward)    Start     Dose/Rate Route Frequency Ordered Stop   10/25/23 1400  piperacillin-tazobactam (ZOSYN) IVPB 3.375 g        3.375 g 12.5 mL/hr over 240 Minutes Intravenous Every 8 hours 10/25/23 0942 11/07/23 2359   10/25/23 0000  ceFEPIme (MAXIPIME) 2 g in sodium chloride 0.9 % 100 mL IVPB  Status:  Discontinued        2 g 200 mL/hr over 30 Minutes Intravenous Every 12 hours 10/24/23 1853 10/25/23 0942   10/24/23 2300  piperacillin-tazobactam (ZOSYN) IVPB 3.375 g  Status:  Discontinued        3.375 g 12.5 mL/hr over 240 Minutes Intravenous Every 8 hours 10/24/23 1509 10/24/23 1853   10/24/23 2300  metroNIDAZOLE (FLAGYL) IVPB 500 mg  Status:  Discontinued        500 mg 100 mL/hr over 60 Minutes Intravenous Every 12 hours 10/24/23 1838 10/25/23 0942   10/24/23 2000  micafungin (MYCAMINE) 100 mg in sodium chloride 0.9 % 100 mL IVPB  Status:  Discontinued        100 mg 105 mL/hr over  1 Hours Intravenous Every 24 hours 10/24/23 1838 10/29/23 1152   10/24/23 1945  vancomycin (VANCOREADY) IVPB 2000 mg/400 mL        2,000 mg 200 mL/hr over 120 Minutes Intravenous NOW 10/24/23 1854 10/24/23 2239   10/24/23 1854  vancomycin variable dose per unstable renal function (pharmacist dosing)  Status:  Discontinued         Does not apply See admin instructions 10/24/23 1854 10/25/23 0942   10/24/23 1245  ceFEPIme (MAXIPIME) 2 g in sodium chloride 0.9 % 100 mL IVPB        2 g 200 mL/hr over 30 Minutes Intravenous  Once 10/24/23 1236 10/24/23 1334   10/24/23 1245  metroNIDAZOLE (FLAGYL) IVPB 500 mg        500 mg 100 mL/hr over 60 Minutes Intravenous  Once 10/24/23 1236  10/24/23 1453       Assessment/Plan: s/p ex lap with subtotal abdominal colectomy in discontinuity, Dr. 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 Magnus Ivan 2/17 for pneumoperitoneum and gangrene of colon  POD 9   -tolerating tube feeds at 60 cc/hr. TNA off. Having ostomy output -continue wound VAC M/TH  - afebrile and tolerating Tfs with good ostomy output however WBC up to 19. Will discuss with MD but likely CT abd/pelvis today given persistent leukocytosis - continue IV abx -Continue therapies for deconditioning -OK to anticoagulate for a fib - may need to be held pending CT findings  FEN: NPO - SLP following. Tfs at goal VTE: therapeutic lovenox ID: [email or web address] 2/14. zosyn 2/15>>   LOS: 12 days   Eric Form, [facility] Surgery 11/05/2023, 9:51 AM Please see Amion for pager number during day hours 7:00am-4:30pm   11/05/2023

## 2023-11-05 NOTE — Progress Notes (Signed)
 Nutrition Follow-up  DOCUMENTATION CODES:   Not applicable  INTERVENTION:   Tube Feeding via Cortrak tube:  Vital 1.5 @ 60 ml/hr 60 ml Prosource TF20 TID  Provides: 2400 kcal, 157 grams protein and 1100 ml free water  200 ml every 6 hours Total free water: 1900 ml   NUTRITION DIAGNOSIS:   Inadequate oral intake related to inability to eat as evidenced by NPO status. Ongoing.   GOAL:   Patient will meet greater than or equal to 90% of their needs Met with TF  MONITOR:   Labs, Skin, I & O's  REASON FOR ASSESSMENT:   Consult Enteral/tube feeding initiation and management  ASSESSMENT:   58 yo male admitted with bowel perforation, massive feculent peritonitis. S/P ex lap, subtotal abdominal colectomy 2/14. PMH includes HTN, HLD, gout, brain aneurysm, prior alcohol abuse, former smoker.  Pt discussed during ICU rounds and with RN and MD.  Pt tolerating TF at goal, ostomy output <1L. Noted pt with increased WBC, plan for abd CT/pelvis today.  Spoke with pt and wife who is at bedside. Pt reports back pain during visit but was confused.   SLP following.   2/14 -  s/p ex lap with subtotal abd colectomy left in discontinuity  2/15 - s/p re-exploration with creation of end ileostomy and closure  2/17 - s/p OR washout and end ileostomy  2/18 - started TPN 2/20 -  starting trickle TF, TPN advancing to goal this PM  2/21 - advance TF by 10 ml every 12 hours to goal rate of 60 ml/hr; extubated 2/24 - cortrak placed; tip gastric    Medications reviewed and include: SSI every 4 hours, MVI with minerals, protonix  Labs reviewed:  WBC 19 CBG's: 117-144  UOP: 775 ml  VAC: 5 ml Ileostomy: 545 ml  Current weight: 117.9 kg Admission weight: 119.3 kg   Diet Order:   Diet Order             Diet NPO time specified Except for: Ice Chips  Diet effective now                   EDUCATION NEEDS:   Not appropriate for education at this time  Skin:  Skin Assessment:  Reviewed RN Assessment (surgical abd incisions)  Last BM:  545 ml x 24 hr via ileostomy  Height:   Ht Readings from Last 1 Encounters:  10/29/23 6\' 1"  (1.854 m)    Weight:   Wt Readings from Last 1 Encounters:  11/05/23 117.9 kg    Ideal Body Weight:  83.6 kg  BMI:  Body mass index is 34.29 kg/m.  Estimated Nutritional Needs:   Kcal:  2200-2400  Protein:  130-150 gm  Fluid:  2.2-2.4 L  Dajour Pierpoint P., RD, LDN, CNSC See AMiON for contact information

## 2023-11-05 NOTE — Procedures (Signed)
 Intubation Procedure Note  Daniel Butler  [id number]  November 14, 1965  Date:11/05/23  Time:7:02 PM   Provider Performing:Farris Blash    Procedure: Intubation (31500)  Indication(s) Respiratory Failure  Consent Unable to obtain consent due to emergent nature of procedure.   Anesthesia Etomidate, Fentanyl, and Rocuronium   Time Out Verified patient identification, verified procedure, site/side was marked, verified correct patient position, special equipment/implants available, medications/allergies/relevant history reviewed, required imaging and test results available.   Sterile Technique Usual hand hygeine, masks, and gloves were used   Procedure Description Patient positioned in bed supine.  Sedation given as noted above.  Patient was intubated with endotracheal tube using Glidescope.  View was Grade 1 full glottis .  Number of attempts was 1.  Colorimetric CO2 detector was consistent with tracheal placement.   Complications/Tolerance None; patient tolerated the procedure well. Chest X-ray is ordered to verify placement.  Lynnell Catalan, MD [facility] ICU Physician [facility] Critical Care  Pager: [phone] Or Epic Secure Chat After hours: [phone].  11/05/2023, 7:03 PM

## 2023-11-05 NOTE — Progress Notes (Addendum)
 PCCM Brief Note  SBP in 80s most of afternoon. Received 500 LR bolus earlier with very minimal improvement. Tried another 500 NS bolus this afternoon with no change.  Will change next midodrine dose which is now from 10 to 20 then change from 10 TID to 15 TID starting tomorrow.  Cancel plan for transfer out of ICU.   Addendum 1800: Vomited while placed flat supine for CT. Definitely aspirated per RN. Began to cough an desaturate. Upon return to ICU, has ongoing vomiting. Receiving Zofran now. Sats 70 initially on 6L. Increased to 10L with improvement to 80s. He is awake and answering questions appropriately. Does not have significantly increased WOB.  Will try HHFNC in hopes we can avoid intubation here. Not a candidate for BiPAP unfortunately.  He is already on Zosyn - continue. Will call wife and update her that he is unfortunately at high risk for intubation overnight if decompensates further.   Addendum 1820: Quickly worsened hypoxia, work of breathing, blood pressure. Wound vac also now dumping out moderate amounts of fluid. Abdomen tighter then before. General surgery updated by RN, to bedside for brief evaluation but was called emergently to OR. Will return to bedside ASAP after OR.  Given instability, safest option is to intubate. I called wife and updated her on plan. She consented and will make her way to the hospital now.  Given hypotension, neo stick given prior to RSI. Neosynephrine infusion added. BP continued to drop. Additional Neo stick given and Vasopressin added. Unfortunately will need to d/c Diltiazem, Lopressor scheduled. Add back Amiodarone gtt no bolus. Hold TF's.  ELINK to follow up on ABG and CXR post intubation.   CC time cumulative: 45 min.   Rutherford Guys, PA - [facility] [place] [facility] For pager details, please see AMION or use Epic chat  After 1900, please call [facility] for cross coverage needs 11/05/2023, 5:05 PM

## 2023-11-05 NOTE — Progress Notes (Signed)
 eLink Physician-Brief Progress Note Patient Name: Daniel Butler DOB: 08-21-1966 MRN: [id number]   Date of Service  11/05/2023  HPI/Events of Note  Notified that patient is back from OR. BP with MAP of 84 while on Levophed (17), Neo (70) and vaso (0.03). Was on vaso and neo preop and levo started intra op. RT is going to place an art line . HR is in 80s. O2 99 on 60% fio2. ABG and cxr post intubation reviewed. LR being started  eICU Interventions  CBC CMP mag and lactate ordered Is on fentanyl infusion as well DW RN should be able to start weaning pressor      Intervention Category Major Interventions: Respiratory failure - evaluation and management  Ivory Maduro G Shawnya Mayor 11/05/2023, 11:53 PM  Addendum at 1:40 am - RT not able to put in art line. Remains on 3 pressors. Asked ground team to place an art line.  Addendum at 4:10 am - Rn called with concern that patient has not woken up at all post op. No cough or gag per RN. Art line was placed by ground team. BP has greatly improved and is off levophed as well. Has been on just 25 mic of fentanyl since arrival anyway Will get CT head.    Addendum at 510 am - noted CT is normal. Allow for time to reassess status.

## 2023-11-05 NOTE — Anesthesia Preprocedure Evaluation (Addendum)
 Anesthesia Evaluation  Patient identified by MRN, date of birth, ID band Patient unresponsive    Reviewed: Patient's Chart, lab work & pertinent test results, Unable to perform ROS - Chart review only  Airway Mallampati: Intubated       Dental  (+) Poor Dentition, Missing   Pulmonary Patient abstained from smoking., former smoker    + decreased breath sounds      Cardiovascular hypertension, Pt. on medications and Pt. on home beta blockers +CHF   Rhythm:Regular Rate:Normal  ECHO 02/25:   1. Left ventricular ejection fraction, by estimation, is 50 to 55% with beat to beat variability in atrial fibrillation. The left ventricle has low normal function. Left ventricular endocardial border not optimally defined to evaluate regional wall motion. There is mild concentric left ventricular hypertrophy. Left ventricular diastolic parameters are indeterminate. There is pseudodyskinesis of the inferolateral wall suggesting intraadominal pathology.  2. Right ventricular systolic function is mildly reduced. The right ventricular size is normal. Tricuspid regurgitation signal is inadequate for assessing PA pressure.  3. The mitral valve is grossly normal. Mild and eccentric mitral valve regurgitation, not optimally visualized due to image quality. No evidence of mitral stenosis.  4. The aortic valve is tricuspid. Aortic valve regurgitation is not visualized. No aortic stenosis is present.  5. Aortic dilatation noted. There is mild dilatation of the aortic root, measuring 43 mm.  6. The inferior vena cava is normal in size with <50% respiratory variability, suggesting right atrial pressure of 8 mmHg.   Conclusion(s)/Recommendation(s): No evidence of valvular vegetations on this transthoracic echocardiogram. Consider a transesophageal echocardiogram to exclude infective endocarditis if clinically indicated.    Neuro/Psych  Headaches  Anxiety Depression        GI/Hepatic Neg liver ROS,GERD  Medicated,,Free air abdomen   Endo/Other  negative endocrine ROS    Renal/GU   negative genitourinary   Musculoskeletal negative musculoskeletal ROS (+)    Abdominal  (+) + obese  Peds  Hematology  (+) Blood dyscrasia, anemia Lab Results      Component                Value               Date                      WBC                      19.0 (H)            11/05/2023                HGB                      9.4 (L)             11/05/2023                HCT                      30.5 (L)            11/05/2023                MCV                      85.2                11/05/2023  PLT                      418 (H)             11/05/2023              Anesthesia Other Findings   Reproductive/Obstetrics                             Anesthesia Physical Anesthesia Plan  ASA: 3 and emergent  Anesthesia Plan: General   Post-op Pain Management: Celebrex PO (pre-op)* and Tylenol PO (pre-op)*   Induction: Intravenous and Rapid sequence  PONV Risk Score and Plan: 1 and Ondansetron, Dexamethasone, Midazolam and Treatment may vary due to age or medical condition  Airway Management Planned: Mask and Oral ETT  Additional Equipment: None  Intra-op Plan:   Post-operative Plan: Post-operative intubation/ventilation  Informed Consent:      Only emergency history available  Plan Discussed with: CRNA  Anesthesia Plan Comments:        Anesthesia Quick Evaluation

## 2023-11-06 ENCOUNTER — Inpatient Hospital Stay (HOSPITAL_COMMUNITY): Payer: Medicaid Other

## 2023-11-06 ENCOUNTER — Encounter (HOSPITAL_COMMUNITY): Payer: Self-pay | Admitting: General Surgery

## 2023-11-06 DIAGNOSIS — K631 Perforation of intestine (nontraumatic): Secondary | ICD-10-CM | POA: Diagnosis not present

## 2023-11-06 DIAGNOSIS — E46 Unspecified protein-calorie malnutrition: Secondary | ICD-10-CM | POA: Diagnosis not present

## 2023-11-06 DIAGNOSIS — A419 Sepsis, unspecified organism: Secondary | ICD-10-CM | POA: Diagnosis not present

## 2023-11-06 DIAGNOSIS — R6521 Severe sepsis with septic shock: Secondary | ICD-10-CM | POA: Diagnosis not present

## 2023-11-06 DIAGNOSIS — N179 Acute kidney failure, unspecified: Secondary | ICD-10-CM | POA: Diagnosis not present

## 2023-11-06 DIAGNOSIS — I4891 Unspecified atrial fibrillation: Secondary | ICD-10-CM | POA: Diagnosis not present

## 2023-11-06 DIAGNOSIS — J9601 Acute respiratory failure with hypoxia: Secondary | ICD-10-CM | POA: Diagnosis not present

## 2023-11-06 LAB — BASIC METABOLIC PANEL
Anion gap: 13 (ref 5–15)
BUN: 66 mg/dL — ABNORMAL HIGH (ref 6–20)
CO2: 20 mmol/L — ABNORMAL LOW (ref 22–32)
Calcium: 7.5 mg/dL — ABNORMAL LOW (ref 8.9–10.3)
Chloride: 106 mmol/L (ref 98–111)
Creatinine, Ser: 1.94 mg/dL — ABNORMAL HIGH (ref 0.61–1.24)
GFR, Estimated: 39 mL/min — ABNORMAL LOW (ref >60)
Glucose, Bld: 100 mg/dL — ABNORMAL HIGH (ref 70–99)
Potassium: 4.4 mmol/L (ref 3.5–5.1)
Sodium: 139 mmol/L (ref 135–145)

## 2023-11-06 LAB — COMPREHENSIVE METABOLIC PANEL
ALT: 18 U/L (ref 0–44)
AST: 40 U/L (ref 15–41)
Albumin: 1.6 g/dL — ABNORMAL LOW (ref 3.5–5.0)
Alkaline Phosphatase: 35 U/L — ABNORMAL LOW (ref 38–126)
Anion gap: 13 (ref 5–15)
BUN: 67 mg/dL — ABNORMAL HIGH (ref 6–20)
CO2: 21 mmol/L — ABNORMAL LOW (ref 22–32)
Calcium: 7.6 mg/dL — ABNORMAL LOW (ref 8.9–10.3)
Chloride: 104 mmol/L (ref 98–111)
Creatinine, Ser: 1.95 mg/dL — ABNORMAL HIGH (ref 0.61–1.24)
GFR, Estimated: 39 mL/min — ABNORMAL LOW (ref >60)
Glucose, Bld: 129 mg/dL — ABNORMAL HIGH (ref 70–99)
Potassium: 4.7 mmol/L (ref 3.5–5.1)
Sodium: 138 mmol/L (ref 135–145)
Total Bilirubin: 0.8 mg/dL (ref 0.0–1.2)
Total Protein: 4.9 g/dL — ABNORMAL LOW (ref 6.5–8.1)

## 2023-11-06 LAB — GLUCOSE, CAPILLARY
Glucose-Capillary: 110 mg/dL — ABNORMAL HIGH (ref 70–99)
Glucose-Capillary: 120 mg/dL — ABNORMAL HIGH (ref 70–99)
Glucose-Capillary: 125 mg/dL — ABNORMAL HIGH (ref 70–99)
Glucose-Capillary: 86 mg/dL (ref 70–99)
Glucose-Capillary: 94 mg/dL (ref 70–99)
Glucose-Capillary: 99 mg/dL (ref 70–99)

## 2023-11-06 LAB — MAGNESIUM: Magnesium: 2 mg/dL (ref 1.7–2.4)

## 2023-11-06 LAB — CBC
HCT: 25.5 % — ABNORMAL LOW (ref 39.0–52.0)
Hemoglobin: 7.5 g/dL — ABNORMAL LOW (ref 13.0–17.0)
MCH: 26.1 pg (ref 26.0–34.0)
MCHC: 29.4 g/dL — ABNORMAL LOW (ref 30.0–36.0)
MCV: 88.9 fL (ref 80.0–100.0)
Platelets: 397 10*3/uL (ref 150–400)
RBC: 2.87 MIL/uL — ABNORMAL LOW (ref 4.22–5.81)
RDW: 20 % — ABNORMAL HIGH (ref 11.5–15.5)
WBC: 12.5 10*3/uL — ABNORMAL HIGH (ref 4.0–10.5)
nRBC: 1.8 % — ABNORMAL HIGH (ref 0.0–0.2)

## 2023-11-06 LAB — CBC WITH DIFFERENTIAL/PLATELET
Abs Immature Granulocytes: 0.1 10*3/uL — ABNORMAL HIGH (ref 0.00–0.07)
Band Neutrophils: 16 %
Basophils Absolute: 0.1 10*3/uL (ref 0.0–0.1)
Basophils Relative: 1 %
Eosinophils Absolute: 0 10*3/uL (ref 0.0–0.5)
Eosinophils Relative: 0 %
HCT: 27.4 % — ABNORMAL LOW (ref 39.0–52.0)
Hemoglobin: 8 g/dL — ABNORMAL LOW (ref 13.0–17.0)
Lymphocytes Relative: 2 %
Lymphs Abs: 0.2 10*3/uL — ABNORMAL LOW (ref 0.7–4.0)
MCH: 26 pg (ref 26.0–34.0)
MCHC: 29.2 g/dL — ABNORMAL LOW (ref 30.0–36.0)
MCV: 89 fL (ref 80.0–100.0)
Metamyelocytes Relative: 1 %
Monocytes Absolute: 0.1 10*3/uL (ref 0.1–1.0)
Monocytes Relative: 1 %
Neutro Abs: 11.7 10*3/uL — ABNORMAL HIGH (ref 1.7–7.7)
Neutrophils Relative %: 79 %
Platelets: 464 10*3/uL — ABNORMAL HIGH (ref 150–400)
RBC: 3.08 MIL/uL — ABNORMAL LOW (ref 4.22–5.81)
RDW: 20.2 % — ABNORMAL HIGH (ref 11.5–15.5)
WBC: 12.3 10*3/uL — ABNORMAL HIGH (ref 4.0–10.5)
nRBC: 2.1 % — ABNORMAL HIGH (ref 0.0–0.2)
nRBC: 4 /100{WBCs} — ABNORMAL HIGH

## 2023-11-06 LAB — LACTIC ACID, PLASMA
Lactic Acid, Venous: 2 mmol/L (ref 0.5–1.9)
Lactic Acid, Venous: 3.2 mmol/L (ref 0.5–1.9)
Lactic Acid, Venous: 3.2 mmol/L (ref 0.5–1.9)

## 2023-11-06 LAB — PHOSPHORUS: Phosphorus: 5.6 mg/dL — ABNORMAL HIGH (ref 2.5–4.6)

## 2023-11-06 MED ORDER — ORAL CARE MOUTH RINSE: 15.0000 mL | OROMUCOSAL | Status: DC | PRN

## 2023-11-06 MED ORDER — SODIUM CHLORIDE 0.9 % IV BOLUS
500.0000 mL | Freq: Once | INTRAVENOUS | Status: AC
  Administered 2023-11-06: 500 mL via INTRAVENOUS

## 2023-11-06 MED ORDER — ORAL CARE MOUTH RINSE
15.0000 mL | OROMUCOSAL | Status: DC
  Administered 2023-11-06 – 2023-11-11 (×68): 15 mL via OROMUCOSAL

## 2023-11-06 MED ORDER — VITAL 1.5 CAL PO LIQD
1000.0000 mL | ORAL | Status: DC
  Administered 2023-11-07: 1000 mL

## 2023-11-06 MED ORDER — PROSOURCE TF20 ENFIT COMPATIBL EN LIQD
60.0000 mL | Freq: Three times a day (TID) | ENTERAL | Status: DC
  Administered 2023-11-06 – 2023-11-08 (×6): 60 mL
  Filled 2023-11-06 (×7): qty 60

## 2023-11-06 MED ORDER — MIDODRINE HCL 5 MG PO TABS
15.0000 mg | ORAL_TABLET | Freq: Three times a day (TID) | ORAL | Status: DC
  Administered 2023-11-06 – 2023-11-10 (×12): 15 mg
  Filled 2023-11-06 (×13): qty 3

## 2023-11-06 NOTE — Anesthesia Postprocedure Evaluation (Signed)
 Anesthesia Post Note  Patient: Daniel Butler  Procedure(s) Performed: EXPLORATORY LAPAROTOMY (Abdomen)     Patient location during evaluation: SICU Anesthesia Type: General Level of consciousness: sedated Pain management: pain level controlled Vital Signs Assessment: post-procedure vital signs reviewed and stable Respiratory status: patient remains intubated per anesthesia plan Cardiovascular status: stable Postop Assessment: no apparent nausea or vomiting Anesthetic complications: no   No notable events documented.  Last Vitals:  Vitals:   11/06/23 0615 11/06/23 0630  BP: (!) 90/57   Pulse: 99 (!) 103  Resp: 18 18  Temp:    SpO2: 100% 100%    Last Pain:  Vitals:   11/06/23 0400  TempSrc: Axillary  PainSc:                  Nelle Don Dazia Lippold

## 2023-11-06 NOTE — Progress Notes (Signed)
 SLP Cancellation Note  Patient Details Name: TRUE GARCIAMARTINEZ MRN: [id number] DOB: [birth year]   Cancelled treatment:       Reason Eval/Treat Not Completed: Patient not medically ready. Ventilated post procedure   Hyacinth Marcelli, Riley Nearing 11/06/2023, 9:09 AM

## 2023-11-06 NOTE — Progress Notes (Signed)
 1 Day Post-Op   Subjective/Chief Complaint: Intubated, sedated On Vasopressin/ Neo Serous VAC output   Objective: Vital signs in last 24 hours: Temp:  [97.3 F (36.3 C)-99.4 F (37.4 C)] 98.5 F (36.9 C) (02/27 0800) Pulse Rate:  [78-121] 101 (02/27 0845) Resp:  [0-30] 18 (02/27 0845) BP: (57-130)/(31-86) 93/58 (02/27 0845) SpO2:  [75 %-100 %] 98 % (02/27 0845) Arterial Line BP: (103-129)/(51-65) 103/52 (02/27 0845) FiO2 (%):  [60 %-100 %] 60 % (02/27 0805) Weight:  [621 kg] 119 kg (02/27 0500) Last BM Date : 11/05/23  Intake/Output from previous day: 02/26 0701 - 02/27 0700 In: 6496.3 [I.V.:3467.9; NG/GT:935; IV Piggyback:2093.3] Out: 8775 [Urine:925; Emesis/NG output:550; Drains:1350; Stool:600; Blood:50] Intake/Output this shift: Total I/O In: 455.4 [I.V.:380.3; NG/GT:50; IV Piggyback:25.1] Out: 65 [Urine:65]  Intubated, sedated, non-responsive Abd - soft VAC intact with serous output Stool in ileostomy  Lab Results:  Recent Labs    11/06/23 0009 11/06/23 0518  WBC 12.3* 12.5*  HGB 8.0* 7.5*  HCT 27.4* 25.5*  PLT 464* 397   BMET Recent Labs    11/06/23 0009 11/06/23 0518  NA 138 139  K 4.7 4.4  CL 104 106  CO2 21* 20*  GLUCOSE 129* 100*  BUN 67* 66*  CREATININE 1.95* 1.94*  CALCIUM 7.6* 7.5*   PT/INR No results for input(s): "LABPROT", "INR" in the last 72 hours. ABG Recent Labs    11/05/23 2009  PHART 7.322*  HCO3 25.1    Studies/Results: CT HEAD WO CONTRAST ( ) Result Date: 11/06/2023 CLINICAL DATA:  58 year old male with altered mental status, delirium. EXAM: CT HEAD WITHOUT CONTRAST TECHNIQUE: Contiguous axial images were obtained from the base of the skull through the vertex without intravenous contrast. RADIATION DOSE REDUCTION: This exam was performed according to the departmental dose-optimization program which includes automated exposure control, adjustment of the mA and/or kV according to patient size and/or use of iterative  reconstruction technique. COMPARISON:  Head CT 08/31/2023. FINDINGS: Brain: Stable cerebral volume. Stable ventricle size and configuration. No midline shift, mass effect, or evidence of intracranial mass lesion. Chronic right frontotemporal encephalomalacia. Stable gray-white matter differentiation throughout the brain. Basilar cisterns remain patent. No acute intracranial hemorrhage identified. No cortically based acute infarct identified. 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 No suspicious intracranial vascular hyperdensity. Skull: Chronic right frontotemporal craniotomy, right vertex burr hole. No acute osseous abnormality identified. Sinuses/Orbits: Bilateral nasal enteric tubes are in place. Ethmoid sinus mucosal thickening has not significantly changed. New sphenoid sinus mucosal thickening and bubbly opacity. Tympanic cavities remain well aerated. Mild new left mastoid effusion. Other: No acute orbit or scalp soft tissue finding. IMPRESSION: 1. No acute intracranial abnormality. 2. 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 3. Bilateral nasoenteric tubes in place with mildly increased paranasal sinus and left mastoid inflammation. Electronically Signed   By: Odessa Fleming M.D.   On: 11/06/2023 04:48   Portable Chest x-ray Result Date: 11/05/2023 CLINICAL DATA:  Endotracheal tube EXAM: PORTABLE CHEST 1 VIEW COMPARISON:  Chest x-ray 10/25/2023 FINDINGS: Endotracheal tube tip is 5.8 cm above the carina. Left-sided central venous catheter tip projects over the SVC. Enteric tube tip is in the gastric fundus. The heart is enlarged. There is a small left pleural effusion with left basilar opacities. No pneumothorax or acute fracture. IMPRESSION: 1. Endotracheal tube tip is 5.8 cm above the carina. 2. Small left pleural effusion with left basilar opacities, likely atelectasis. Electronically Signed   By: Mcneil Sober.D.  On: 11/05/2023 19:27   CT  ABDOMEN PELVIS W CONTRAST Result Date: 11/05/2023 CLINICAL DATA:  Postoperative abdominal pain, recent colon resection and ileostomy EXAM: CT ABDOMEN AND PELVIS WITH CONTRAST TECHNIQUE: Multidetector CT imaging of the abdomen and pelvis was performed using the standard protocol following bolus administration of intravenous contrast. RADIATION DOSE REDUCTION: This exam was performed according to the departmental dose-optimization program which includes automated exposure control, adjustment of the mA and/or kV according to patient size and/or use of iterative reconstruction technique. CONTRAST:  58mL OMNIPAQUE IOHEXOL 350 MG/ML SOLN COMPARISON:  10/24/2023, 11/03/2023 FINDINGS: Lower chest: There are small bilateral pleural effusions, with dense consolidation and volume loss within the lower lobes consistent with atelectasis. Hepatobiliary: No focal liver abnormalities. High attenuation material within the gallbladder consistent with sludge or small gallstones. No evidence of acute cholecystitis. No biliary duct dilation. Pancreas: Unremarkable. No pancreatic ductal dilatation or surrounding inflammatory changes. Spleen: Normal in size without focal abnormality. Adrenals/Urinary Tract: Adrenal glands are unremarkable. Kidneys are normal, without renal calculi, focal lesion, or hydronephrosis. Bladder is decompressed, limiting its evaluation. Stomach/Bowel: Postsurgical changes are seen from subtotal colectomy and right-sided ileostomy. The Hartmann pouch is distended and fluid-filled. Enteric catheter is coiled within the gastric lumen. Oral contrast is seen within the stomach and proximal small bowel. There is diffuse small bowel distension, most pronounced within the proximal jejunum measuring 4.1 cm. No transition point is visualized, under scattered gas fluid levels throughout the small bowel most compatible with postoperative ileus. There is a large amount of extraluminal gas and fluid throughout the abdomen  and pelvis, suggesting perforated viscus. The exact site of perforation is not identified. The suture line at the [facility] pouch appears intact. No rim enhancement to suggest abscess at this time. Vascular/Lymphatic: Aortic atherosclerosis. No enlarged abdominal or pelvic lymph nodes. Reproductive: Prostate is unremarkable. Other: Diffuse extraluminal gas and fluid throughout the abdomen and pelvis, most compatible with perforated viscus. The exact site of perforation is not identified, and surgical consultation is recommended. Postsurgical changes from midline laparotomy. There is extension of the extraluminal gas into the laparotomy incision line just superior to the umbilicus, suggesting developing fistula. No abdominal wall hernia. Musculoskeletal: No acute or destructive bony abnormalities. Reconstructed images demonstrate no additional findings. IMPRESSION: 1. Postsurgical changes from subtotal colectomy, Hartmann's pouch, and diverting ileostomy within the right mid abdomen. 2. Extensive extraluminal gas and fluid throughout the abdomen and pelvis, most compatible with perforated viscus. No peripheral enhancement to suggest abscess at this time. 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 Surgical consultation recommended. 3. Mild diffuse small bowel distension with multiple gas fluid levels suggesting postoperative ileus. 4. Small bilateral pleural effusions, with dense bilateral lower lobe atelectasis. 5. Gallbladder sludge without evidence of acute cholecystitis. 6. Enteric catheter tip within the gastric lumen at the fundus. 7.  Aortic Atherosclerosis (ICD10-I70.0). Critical Value/emergent results were called by telephone at the time of interpretation on 11/05/2023 at 7:07 pm to charge nurse, Amy, who verbally acknowledged these results. The charge nurse will relay the message to the  physician covering the surgical service. Electronically Signed   By: Sharlet Salina M.D.   On: 11/05/2023 19:12    Anti-infectives: Anti-infectives (From admission, onward)    Start     Dose/Rate Route Frequency Ordered Stop   11/06/23 0000  vancomycin (VANCOREADY) IVPB 1250 mg/250 mL        1,250  mg 166.7 mL/hr over 90 Minutes Intravenous Every 24 hours 11/05/23 2320     10/25/23 1400  piperacillin-tazobactam (ZOSYN) IVPB 3.375 g        3.375 g 12.5 mL/hr over 240 Minutes Intravenous Every 8 hours 10/25/23 0942 11/07/23 2359   10/25/23 0000  ceFEPIme (MAXIPIME) 2 g in sodium chloride 0.9 % 100 mL IVPB  Status:  Discontinued        2 g 200 mL/hr over 30 Minutes Intravenous Every 12 hours 10/24/23 1853 10/25/23 0942   10/24/23 2300  piperacillin-tazobactam (ZOSYN) IVPB 3.375 g  Status:  Discontinued        3.375 g 12.5 mL/hr over 240 Minutes Intravenous Every 8 hours 10/24/23 1509 10/24/23 1853   10/24/23 2300  metroNIDAZOLE (FLAGYL) IVPB 500 mg  Status:  Discontinued        500 mg 100 mL/hr over 60 Minutes Intravenous Every 12 hours 10/24/23 1838 10/25/23 0942   10/24/23 2000  micafungin (MYCAMINE) 100 mg in sodium chloride 0.9 % 100 mL IVPB  Status:  Discontinued        100 mg 105 mL/hr over 1 Hours Intravenous Every 24 hours 10/24/23 1838 10/29/23 1152   10/24/23 1945  vancomycin (VANCOREADY) IVPB 2000 mg/400 mL        2,000 mg 200 mL/hr over 120 Minutes Intravenous NOW 10/24/23 1854 10/24/23 2239   10/24/23 1854  vancomycin variable dose per unstable renal function (pharmacist dosing)  Status:  Discontinued         Does not apply See admin instructions 10/24/23 1854 10/25/23 0942   10/24/23 1245  ceFEPIme (MAXIPIME) 2 g in sodium chloride 0.9 % 100 mL IVPB        2 g 200 mL/hr over 30 Minutes Intravenous  Once 10/24/23 1236 10/24/23 1334   10/24/23 1245  metroNIDAZOLE (FLAGYL) IVPB 500 mg        500 mg 100 mL/hr over 60 Minutes Intravenous  Once 10/24/23 1236 10/24/23 1453        Assessment/Plan: -S/p ex lap with subtotal abdominal colectomy for free air/ ischemic colitis (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 Magnus Ivan 2/17 -s/p exploratory laparotomy/ LOA/ resection of additional sigmoid colon stump/ debridement of abdominal wall 11/05/23   -Was tolerating tube feeds at 60 cc/hr. Small bowel and ileostomy appeared viable at the time of surgery 2/26.  May restart tube feeds. Having ostomy output - Plan return to OR for exploration/ possible bowel resection/ possible abdominal closure and VAC placement tomorrow 2/28. - continue IV abx -Continue therapies for deconditioning -OK to anticoagulate for a fib with short-acting anticoagulation   FEN: NPO - SLP following. VTE: therapeutic lovenox ID: [email or web address] 2/14. zosyn 2/15>>    LOS: 13 days    Wynona Luna 11/06/2023

## 2023-11-06 NOTE — Progress Notes (Signed)
 Nutrition Follow-up  DOCUMENTATION CODES:   Not applicable  INTERVENTION:   Tube Feeding via Cortrak tube:   Vital 1.5 @ 20 ml/hr   As able recommend  Vital 1.5 @ 60 ml/hr 60 ml Prosource TF20 TID  Provides: 2400 kcal, 157 grams protein and 1100 ml free water  200 ml every 6 hours Total free water: 1900 ml   NUTRITION DIAGNOSIS:   Inadequate oral intake related to inability to eat as evidenced by NPO status. Ongoing.   GOAL:   Patient will meet greater than or equal to 90% of their needs Met with TF  MONITOR:   Labs, Skin, I & O's  REASON FOR ASSESSMENT:   Consult Enteral/tube feeding initiation and management  ASSESSMENT:   58 yo male admitted with bowel perforation, massive feculent peritonitis. S/P ex lap, subtotal abdominal colectomy 2/14. PMH includes HTN, HLD, gout, brain aneurysm, prior alcohol abuse, former smoker.  Pt discussed during ICU rounds and with RN.  Pt had CT of abd and pelvis 2/26; taken to OR for sigmoid colon resection. Per rounds ok to start trickle TF. Noted Dr Harlon Flor ok to resume TF, discussed with Lauren CCM PA.   2/14 -  s/p ex lap with subtotal abd colectomy left in discontinuity  2/15 - s/p re-exploration with creation of end ileostomy and closure  2/17 - s/p OR washout and end ileostomy  2/18 - started TPN 2/20 -  starting trickle TF, TPN advancing to goal this PM  2/21 - advance TF by 10 ml every 12 hours to goal rate of 60 ml/hr; extubated 2/24 - cortrak placed; tip gastric  2/26 - s/p ex lap, LOA > 60  min, partial sigmoid colon resection with anastomosis, excision of necrotic appearing fascia and subcutaneous tissue 2 cm x 10 cm; ~ 4.6 L of stool and pus drained from abd; NG tube placed   Medications reviewed and include: colace, SSI every 4 hours, MVI with minerals, protonix, miralax  Fentanyl  LR @ 75 ml/hr Levophed @ 8 mcg Neo @ 200 mcg  Vasopressin @ 0.03  Labs reviewed:  WBC 19 -> 12.5 CBG's: 86-96 (NPO)  UOP:  925 ml  VAC: 1350 ml Ileostomy: 600 ml NG: 550 ml (200 ml emesis)   Current weight: 119 kg Admission weight: 119.3 kg   Diet Order:   Diet Order             Diet NPO time specified Except for: Sips with Meds  Diet effective now                   EDUCATION NEEDS:   Not appropriate for education at this time  Skin:  Skin Assessment: Reviewed RN Assessment (surgical abd incisions)  Last BM:  545 ml x 24 hr via ileostomy  Height:   Ht Readings from Last 1 Encounters:  10/29/23 6\' 1"  (1.854 m)    Weight:   Wt Readings from Last 1 Encounters:  11/06/23 119 kg    Ideal Body Weight:  83.6 kg  BMI:  Body mass index is 34.61 kg/m.  Estimated Nutritional Needs:   Kcal:  2200-2400  Protein:  130-150 gm  Fluid:  2.2-2.4 L  Harjot Dibello P., RD, LDN, CNSC See AMiON for contact information

## 2023-11-06 NOTE — Progress Notes (Signed)
 RT transported pt to CT and back on ventilator without complication.

## 2023-11-06 NOTE — Procedures (Signed)
 Arterial Catheter Insertion Procedure Note  ADAN BAEHR 07/22/1966  Date: 11/06/23 Time: [id number]  Provider Performing: Madaline Brilliant DO   Procedure: Insertion of Arterial Line (30160) with US guidance (10932)    Indication(s) Blood pressure monitoring and/or need for frequent ABGs  Consent Risks of the procedure as well as the alternatives and risks of each were explained to the patient and/or caregiver.  Consent for the procedure was obtained and is signed in the bedside chart   Anesthesia Local lidocaine   Time Out Verified patient identification, verified procedure, site/side was marked, verified correct patient position, special equipment/implants available, medications/allergies/relevant history reviewed, required imaging and test results available.   Sterile Technique Maximal sterile technique including full sterile barrier drape, hand hygiene, sterile gown, sterile gloves, mask, hair covering, sterile ultrasound probe cover (if used).   Procedure Description Area of catheter insertion was cleaned with chlorhexidine and draped in sterile fashion. With real-time ultrasound guidance an arterial catheter was placed into the RIGHT femoral artery.  Appropriate arterial tracings confirmed on monitor.     Complications/Tolerance None; patient tolerated the procedure well.   EBL Minimal   Specimen(s) None   Madaline Brilliant DO Pulmonary & Critical Care Medicine    Please see [email or web address] for pager details.   From 7A-7P if no response, please call [phone] After hours, please call ELink [phone]

## 2023-11-06 NOTE — Progress Notes (Signed)
 PT Cancellation Note  Patient Details Name: [name] MRN: [id number] DOB: 1966/02/28   Cancelled Treatment:    Reason Eval/Treat Not Completed: Medical issues which prohibited therapy (intubated/sedated)  [name], PT, DPT Acute Rehabilitation Services Office [phone]    [name] 11/06/2023, 3:50 PM

## 2023-11-06 NOTE — Progress Notes (Signed)
 RT attempted to place arterial line x2 and was unsuccessful. RN aware, MD notified.

## 2023-11-06 NOTE — Progress Notes (Signed)
 Date and time results received: 11/06/23 12:49 AM   Test: lactic Critical Value: 2  Name of Provider Notified: E-link

## 2023-11-06 NOTE — Progress Notes (Signed)
 Patient still no responding to pain and does not have a cough/gag post surgery, Dr. Roxan Hockey aware, orders received for a stat head CT.

## 2023-11-06 NOTE — Consult Note (Signed)
 WOC Nurse ostomy follow up Patient back to OR 11/05/23 and planned for 11/07/23.  No WOC needs at this time. Will recheck on 11/10/23 Will follow.  Mike Gip MSN, RN, FNP-BC [facility]ON Wound, Ostomy, Continence Nurse Outpatient [facility] [phone] Pager [phone]

## 2023-11-06 NOTE — Progress Notes (Signed)
 NAME:  Daniel Butler, MRN:  [id number], DOB:  [birth year], LOS: 13 ADMISSION DATE:  10/24/2023, CONSULTATION DATE:  10/24/2023 REFERRING MD:  Dr. Bedelia Person - CCS, CHIEF COMPLAINT:  Bowel perf   History of Present Illness:  Daniel Butler is a 58 y.o. 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  States he has felt unwell x 3 days.  On ED arrival patient was seen febrile with temperature 103.4, tachypneic, and tachycardic.  Lab work significant for K2.4, glucose 116, creatinine 3.12, anion gap 26, alkaline phosphate 33, albumin 2.5, lactic 10.3, WBC 19.6.  CT abdomen and pelvis obtained which revealed substantial free air concerning for acute bowel perforation.  Surgery consulted and patient underwent exploratory laparotomy which revealed diffuse colonic distention with ischemia and gangrenous colonic necrosis with scattered areas of perforation and massive flocculent peritonitis.  Postprocedure patient remained intubated and sedated, PCCM consulted for further management admission.  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 OR for subtotal colectomy, intestinal discontinuity  2/15: con't on pressor support, ID consult  2/17: exploratory laparotomy, end ileostomy 2/18: goal to wake up and wean, will need TPN  2/19: waking, gets tachypneic, tachycardic with SBT 2/20: failed SBT. Starting trickle feeds, lasix BID, PO oxy and klonopin  2/21: extubated. 2/26 fluid bolus and free water per tube added 2/26 reintubated for respiratory distress secondary to possible aspiration during abdominal CT scan.  Brought back to operating room for perforated sigmoid stump.  Underwent lysis of adhesions.  Excision of necrotic tissue and partial sigmoid resection.  Interim History / Subjective:  Returned to OR yesterday after perforating sigmoid stump.  Stump appeared necrotic likely from initial process causing colonic ischemia.    Objective   Blood pressure 117/62, pulse 96, temperature 98.5 F (36.9 C), temperature source Axillary, resp. rate 18, height 6\' 1"  (1.854 m), weight 119 kg, SpO2 96%. CVP:  [9 mmHg-13 mmHg] 13 mmHg  SpO2: 96 % O2 Flow Rate (L/min): 15 L/min FiO2 (%): 40 % (weaned to 40% d/t sat 97%)   Intake/Output Summary (Last 24 hours) at 11/06/2023 1526 Last data filed at 11/06/2023 1300 Gross per 24 hour  Intake 6557.96 ml  Output 9455 ml  Net -2897.04 ml   Filed Weights   11/04/23 0500 11/05/23 0500 11/06/23 0500  Weight: 116.4 kg 117.9 kg 119 kg   Physical Exam: General: acute on chronically ill appearing male, lying in bed HENT: ET tube in smallbore feeding tube in place.  NG tube draining gastric contents. Respiratory: Clear  bilaterally.  Acceptable airway pressures. Cardiovascular: In  atrial fibrillation.  Normal heart sounds.  Extremities remain cool.  CVP 5. GI: Distended large midline back in place.  Ileostomy continues to function normally. Extremities: No edema. Neuro: Appears comfortable.  Eyes are open will track to voice. Ancillary tests personally reviewed  Electrolytes remain normal.  Mild metabolic acidosis.  Creatinine has risen slightly to 1.94 Leukocytosis to 12.5 stable.   Assessment & Plan:   Gangrenous Colitis  with enterococcus fecalis bacteremia. Status post initial colonic resection with ileostomy. Now status post sigmoid resection and abscess drainage Septic shock secondary to above Acute hypoxic respiratory failure secondary to above and possible aspiration. Atrial fibrillation History of HFpEF Atrial fibrillation likely 2/2 sepsis, critical illness. Difficult to control. Acute HFpEF. Acute kidney injury Severe protein calorie malnutrition Severe deconditioning  Plan:  -Plan to return to OR tomorrow for further debridement.  Therefore we will make few changes today. -Continue full ventilator support.  Comfortable on fentanyl infusion and enteral sedatives alone. -Titrate vasopressors to keep MAP greater than 65. -Amiodarone for rate control. -Ordering TPN as anticipate patient will not be able to receive enteral nutrition at least through weekend. -May still be volume seeking.  Will challenge with albumin.  Best Practice (right click and "Reselect all SmartList Selections" daily)   Diet/type: NPO except for medications.  Restarting TPN. DVT prophylaxis DVT prophylaxis.  Hold heparin infusion while going back to operating room. Pressure ulcer(s): N/A GI prophylaxis: PPI Lines: Left subclavian, art line-both still necessary Foley: Foley catheter still necessary Code Status:  full code Last date of multidisciplinary goals of care discussion: updated wife at bedside 2/26  CRITICAL CARE Performed by: Lynnell Catalan   Total critical  care time: 40 minutes  Critical care time was exclusive of separately billable procedures and treating other patients.  Critical care was necessary to treat or prevent imminent or life-threatening deterioration.  Critical care was time spent personally by me on the following activities: development of treatment plan with patient and/or surrogate as well as nursing, discussions with consultants, evaluation of patient's response to treatment, examination of patient, obtaining history from patient or surrogate, ordering and performing treatments and interventions, ordering and review of laboratory studies, ordering and review of radiographic studies, pulse oximetry, re-evaluation of patient's condition and participation in multidisciplinary rounds.  Lynnell Catalan, MD [facility] ICU Physician [facility]  Pager: [phone] Mobile: [phone] After hours: [phone].

## 2023-11-07 ENCOUNTER — Inpatient Hospital Stay (HOSPITAL_COMMUNITY): Payer: Medicaid Other

## 2023-11-07 ENCOUNTER — Other Ambulatory Visit: Payer: Self-pay

## 2023-11-07 ENCOUNTER — Encounter (HOSPITAL_COMMUNITY)
Admission: EM | Disposition: A | Discharge status: Hospice | Payer: Self-pay | Source: Home / Self Care | Attending: Pulmonary Disease

## 2023-11-07 DIAGNOSIS — J9601 Acute respiratory failure with hypoxia: Secondary | ICD-10-CM | POA: Diagnosis not present

## 2023-11-07 DIAGNOSIS — R6521 Severe sepsis with septic shock: Secondary | ICD-10-CM | POA: Diagnosis not present

## 2023-11-07 DIAGNOSIS — K631 Perforation of intestine (nontraumatic): Secondary | ICD-10-CM

## 2023-11-07 DIAGNOSIS — I10 Essential (primary) hypertension: Secondary | ICD-10-CM

## 2023-11-07 DIAGNOSIS — K559 Vascular disorder of intestine, unspecified: Secondary | ICD-10-CM

## 2023-11-07 DIAGNOSIS — R7881 Bacteremia: Secondary | ICD-10-CM

## 2023-11-07 DIAGNOSIS — D649 Anemia, unspecified: Secondary | ICD-10-CM

## 2023-11-07 DIAGNOSIS — A419 Sepsis, unspecified organism: Secondary | ICD-10-CM | POA: Diagnosis not present

## 2023-11-07 DIAGNOSIS — K659 Peritonitis, unspecified: Secondary | ICD-10-CM | POA: Diagnosis not present

## 2023-11-07 DIAGNOSIS — S31109A Unspecified open wound of abdominal wall, unspecified quadrant without penetration into peritoneal cavity, initial encounter: Secondary | ICD-10-CM

## 2023-11-07 DIAGNOSIS — E43 Unspecified severe protein-calorie malnutrition: Secondary | ICD-10-CM

## 2023-11-07 HISTORY — PX: LAPAROTOMY: SHX154

## 2023-11-07 LAB — BASIC METABOLIC PANEL
Anion gap: 8 (ref 5–15)
Anion gap: 11 (ref 5–15)
BUN: 59 mg/dL — ABNORMAL HIGH (ref 6–20)
BUN: 51 mg/dL — ABNORMAL HIGH (ref 6–20)
CO2: 22 mmol/L (ref 22–32)
CO2: 19 mmol/L — ABNORMAL LOW (ref 22–32)
Calcium: 7.4 mg/dL — ABNORMAL LOW (ref 8.9–10.3)
Calcium: 7.6 mg/dL — ABNORMAL LOW (ref 8.9–10.3)
Chloride: 111 mmol/L (ref 98–111)
Chloride: 114 mmol/L — ABNORMAL HIGH (ref 98–111)
Creatinine, Ser: 1.2 mg/dL (ref 0.61–1.24)
Creatinine, Ser: 1.34 mg/dL — ABNORMAL HIGH (ref 0.61–1.24)
GFR, Estimated: >60 mL/min (ref >60)
GFR, Estimated: >60 mL/min (ref >60)
Glucose, Bld: 114 mg/dL — ABNORMAL HIGH (ref 70–99)
Glucose, Bld: 94 mg/dL (ref 70–99)
Potassium: 3.1 mmol/L — ABNORMAL LOW (ref 3.5–5.1)
Potassium: 2.9 mmol/L — ABNORMAL LOW (ref 3.5–5.1)
Sodium: 141 mmol/L (ref 135–145)
Sodium: 144 mmol/L (ref 135–145)

## 2023-11-07 LAB — CBC
HCT: 18.4 % — ABNORMAL LOW (ref 39.0–52.0)
Hemoglobin: 5.6 g/dL — CL (ref 13.0–17.0)
MCH: 26.2 pg (ref 26.0–34.0)
MCHC: 30.4 g/dL (ref 30.0–36.0)
MCV: 86 fL (ref 80.0–100.0)
Platelets: 348 10*3/uL (ref 150–400)
RBC: 2.14 MIL/uL — ABNORMAL LOW (ref 4.22–5.81)
RDW: 19.9 % — ABNORMAL HIGH (ref 11.5–15.5)
WBC: 15.8 10*3/uL — ABNORMAL HIGH (ref 4.0–10.5)
nRBC: 1 % — ABNORMAL HIGH (ref 0.0–0.2)

## 2023-11-07 LAB — HEMOGLOBIN AND HEMATOCRIT, BLOOD
HCT: 22.6 % — ABNORMAL LOW (ref 39.0–52.0)
Hemoglobin: 7.3 g/dL — ABNORMAL LOW (ref 13.0–17.0)

## 2023-11-07 LAB — GLUCOSE, CAPILLARY
Glucose-Capillary: 103 mg/dL — ABNORMAL HIGH (ref 70–99)
Glucose-Capillary: 101 mg/dL — ABNORMAL HIGH (ref 70–99)
Glucose-Capillary: 85 mg/dL (ref 70–99)
Glucose-Capillary: 79 mg/dL (ref 70–99)
Glucose-Capillary: 85 mg/dL (ref 70–99)
Glucose-Capillary: 82 mg/dL (ref 70–99)

## 2023-11-07 LAB — PHOSPHORUS: Phosphorus: 4.2 mg/dL (ref 2.5–4.6)

## 2023-11-07 LAB — PROTIME-INR
INR: 1.3 — ABNORMAL HIGH (ref 0.8–1.2)
Prothrombin Time: 15.9 s — ABNORMAL HIGH (ref 11.4–15.2)

## 2023-11-07 LAB — PREPARE RBC (CROSSMATCH)

## 2023-11-07 LAB — MAGNESIUM: Magnesium: 2 mg/dL (ref 1.7–2.4)

## 2023-11-07 LAB — SURGICAL PATHOLOGY

## 2023-11-07 SURGERY — LAPAROTOMY, EXPLORATORY
Anesthesia: General | Site: Abdomen

## 2023-11-07 MED ORDER — ONDANSETRON HCL 4 MG/2ML IJ SOLN
INTRAMUSCULAR | Status: DC | PRN
  Administered 2023-11-07: 4 mg via INTRAVENOUS

## 2023-11-07 MED ORDER — ALBUMIN HUMAN 5 % IV SOLN: INTRAVENOUS | Status: DC | PRN

## 2023-11-07 MED ORDER — FENTANYL CITRATE (PF) 250 MCG/5ML IJ SOLN
INTRAMUSCULAR | Status: DC | PRN
  Administered 2023-11-07 (×3): 50 ug via INTRAVENOUS

## 2023-11-07 MED ORDER — SUGAMMADEX SODIUM 200 MG/2ML IV SOLN
INTRAVENOUS | Status: AC
  Filled 2023-11-07: qty 2

## 2023-11-07 MED ORDER — SODIUM CHLORIDE 0.9% IV SOLUTION: Freq: Once | INTRAVENOUS | Status: AC

## 2023-11-07 MED ORDER — SUGAMMADEX SODIUM 200 MG/2ML IV SOLN
INTRAVENOUS | Status: DC | PRN
  Administered 2023-11-07: 200 mg via INTRAVENOUS

## 2023-11-07 MED ORDER — ROCURONIUM BROMIDE 100 MG/10ML IV SOLN
INTRAVENOUS | Status: DC | PRN
  Administered 2023-11-07: 60 mg via INTRAVENOUS

## 2023-11-07 MED ORDER — POTASSIUM CHLORIDE 10 MEQ/50ML IV SOLN
10.0000 meq | INTRAVENOUS | Status: AC
  Administered 2023-11-07 – 2023-11-08 (×4): 10 meq via INTRAVENOUS
  Filled 2023-11-07 (×4): qty 50

## 2023-11-07 MED ORDER — 0.9 % SODIUM CHLORIDE (POUR BTL) OPTIME
TOPICAL | Status: DC | PRN
  Administered 2023-11-07: 4000 mL

## 2023-11-07 MED ORDER — VANCOMYCIN HCL 1750 MG/350ML IV SOLN
1750.0000 mg | INTRAVENOUS | Status: DC
  Administered 2023-11-07 – 2023-11-08 (×2): 1750 mg via INTRAVENOUS
  Filled 2023-11-07 (×2): qty 350

## 2023-11-07 MED ORDER — POTASSIUM CHLORIDE 10 MEQ/50ML IV SOLN
10.0000 meq | INTRAVENOUS | Status: AC
  Administered 2023-11-07 (×4): 10 meq via INTRAVENOUS
  Filled 2023-11-07: qty 50

## 2023-11-07 MED ORDER — PIPERACILLIN-TAZOBACTAM 3.375 G IVPB
3.3750 g | Freq: Three times a day (TID) | INTRAVENOUS | Status: DC
  Administered 2023-11-07 – 2023-11-11 (×11): 3.375 g via INTRAVENOUS
  Filled 2023-11-07 (×11): qty 50

## 2023-11-07 SURGICAL SUPPLY — 43 items
BAG COUNTER SPONGE SURGICOUNT (BAG) ×1 IMPLANT
BLADE CLIPPER SURG (BLADE) IMPLANT
CANISTER SUCT 3000ML PPV (MISCELLANEOUS) ×1 IMPLANT
CANISTER WOUNDNEG PRESSURE 500 (CANNISTER) IMPLANT
CHLORAPREP W/TINT 26 (MISCELLANEOUS) ×1 IMPLANT
COVER SURGICAL LIGHT HANDLE (MISCELLANEOUS) ×1 IMPLANT
DRAIN CHANNEL 19F RND (DRAIN) IMPLANT
DRAPE LAPAROSCOPIC ABDOMINAL (DRAPES) ×1 IMPLANT
DRAPE WARM FLUID 44X44 (DRAPES) ×1 IMPLANT
DRSG OPSITE POSTOP 4X10 (GAUZE/BANDAGES/DRESSINGS) IMPLANT
DRSG OPSITE POSTOP 4X8 (GAUZE/BANDAGES/DRESSINGS) IMPLANT
DRSG VAC GRANUFOAM LG (GAUZE/BANDAGES/DRESSINGS) IMPLANT
ELECT BLADE 6.5 EXT (BLADE) IMPLANT
ELECT CAUTERY BLADE 6.4 (BLADE) ×2 IMPLANT
ELECT REM PT RETURN 9FT ADLT (ELECTROSURGICAL) ×1 IMPLANT
ELECTRODE REM PT RTRN 9FT ADLT (ELECTROSURGICAL) ×1 IMPLANT
EVACUATOR SILICONE 100CC (DRAIN) IMPLANT
GLOVE BIO SURGEON STRL SZ7 (GLOVE) ×1 IMPLANT
GLOVE BIOGEL PI IND STRL 7.5 (GLOVE) ×1 IMPLANT
GOWN STRL REUS W/ TWL LRG LVL3 (GOWN DISPOSABLE) ×2 IMPLANT
HANDLE SUCTION POOLE (INSTRUMENTS) ×1 IMPLANT
KIT BASIN OR (CUSTOM PROCEDURE TRAY) ×1 IMPLANT
KIT OSTOMY DRAINABLE 2.75 STR (WOUND CARE) IMPLANT
KIT TURNOVER KIT B (KITS) ×1 IMPLANT
LIGASURE IMPACT 36 18CM CVD LR (INSTRUMENTS) IMPLANT
NS IRRIG 1000ML POUR BTL (IV SOLUTION) ×2 IMPLANT
PACK GENERAL/GYN (CUSTOM PROCEDURE TRAY) ×1 IMPLANT
PAD ARMBOARD 7.5X6 YLW CONV (MISCELLANEOUS) ×1 IMPLANT
PENCIL SMOKE EVACUATOR (MISCELLANEOUS) ×1 IMPLANT
SPECIMEN JAR LARGE (MISCELLANEOUS) IMPLANT
SPONGE T-LAP 18X18 ~~LOC~~+RFID (SPONGE) IMPLANT
STAPLER VISISTAT 35W (STAPLE) ×1 IMPLANT
SUCTION POOLE HANDLE (INSTRUMENTS) ×1 IMPLANT
SUT ETHILON 2 0 FS 18 (SUTURE) IMPLANT
SUT PDS AB 1 TP1 96 (SUTURE) ×2 IMPLANT
SUT SILK 2 0 SH CR/8 (SUTURE) ×1 IMPLANT
SUT SILK 2 0 TIES 10X30 (SUTURE) ×1 IMPLANT
SUT SILK 3 0 SH CR/8 (SUTURE) ×1 IMPLANT
SUT SILK 3 0 TIES 10X30 (SUTURE) ×1 IMPLANT
SUT VIC AB 3-0 SH 18 (SUTURE) IMPLANT
TOWEL GREEN STERILE (TOWEL DISPOSABLE) ×1 IMPLANT
TRAY FOLEY MTR SLVR 16FR STAT (SET/KITS/TRAYS/PACK) IMPLANT
YANKAUER SUCT BULB TIP NO VENT (SUCTIONS) IMPLANT

## 2023-11-07 NOTE — Progress Notes (Signed)
 Nutrition Follow-up  DOCUMENTATION CODES:   Not applicable  INTERVENTION:   Post op plan to resume tube feeding via Cortrak tube:   Vital 1.5 @ 20 ml/hr   As able recommend increase to goal rate:  Vital 1.5 @ 60 ml/hr 60 ml Prosource TF20 TID  Provides: 2400 kcal, 157 grams protein and 1100 ml free water  200 ml every 6 hours Total free water: 1900 ml    If unable to initiate and advance TF post op would recommend starting TPN next date 3/1.    NUTRITION DIAGNOSIS:   Inadequate oral intake related to inability to eat as evidenced by NPO status. Ongoing.   GOAL:   Patient will meet greater than or equal to 90% of their needs Progressing.   MONITOR:   Labs, Skin, I & O's  REASON FOR ASSESSMENT:   Consult Enteral/tube feeding initiation and management  ASSESSMENT:   58 yo male admitted with bowel perforation, massive feculent peritonitis. S/P ex lap, subtotal abdominal colectomy 2/14. PMH includes HTN, HLD, gout, brain aneurysm, prior alcohol abuse, former smoker.  Pt discussed during ICU rounds and with RN and MD. Discussed with team, will hold TPN for now. TF held for OR, ok to resume post op per MD.    2/14 -  s/p ex lap with subtotal abd colectomy left in discontinuity  2/15 - s/p re-exploration with creation of end ileostomy and closure  2/17 - s/p OR washout and end ileostomy  2/18 - started TPN 2/20 -  starting trickle TF, TPN advancing to goal this PM  2/21 - advance TF by 10 ml every 12 hours to goal rate of 60 ml/hr; extubated 2/24 - cortrak placed; tip gastric  2/26 - s/p ex lap, LOA > 60  min, partial sigmoid colon resection with anastomosis, excision of necrotic appearing fascia and subcutaneous tissue 2 cm x 10 cm; ~ 4.6 L of stool and pus drained from abd; NG tube placed  2/27 - s/p ex lap, abd closure, placement of VAC   Medications reviewed and include: colace, SSI every 4 hours, MVI with minerals, protonix, miralax  Fentanyl  Neo @ 120  mcg  10 mEq KCl x 5  Vasopressin @ 0.03  Labs reviewed:  K 3.1 CBG's: 85-125  UOP: 925 ml  VAC: 1350 ml Ileostomy: 600 ml NG: 550 ml (200 ml emesis)   Current weight: 128.5 kg Admission weight: 119.3 kg  UOP 1435 ml  LUQ JP, just placed VAC: 700 ml    Diet Order:   Diet Order             Diet NPO time specified  Diet effective now                   EDUCATION NEEDS:   Not appropriate for education at this time  Skin:  Skin Assessment: Skin Integrity Issues: Skin Integrity Issues:: Wound VAC Wound Vac: Abd  Last BM:  115 ml x 24 hr via ileostomy  Height:   Ht Readings from Last 1 Encounters:  10/29/23 6\' 1"  (1.854 m)    Weight:   Wt Readings from Last 1 Encounters:  11/07/23 128.5 kg    Ideal Body Weight:  83.6 kg  BMI:  Body mass index is 37.38 kg/m.  Estimated Nutritional Needs:   Kcal:  2300-2500  Protein:  135-155 grams  Fluid:  >2.3 L/day  Cammy Copa., RD, LDN, CNSC See AMiON for contact information

## 2023-11-07 NOTE — Progress Notes (Addendum)
 eLink Physician-Brief Progress Note Patient Name: Daniel Butler DOB: [birth year] MRN: [id number]   Date of Service  11/07/2023  HPI/Events of Note    eICU Interventions     CKD Creatinine 1,34 Hypokalemia 2.9  Wil replace   Periods of desats  Obtain CXR and ABG     Massie Maroon 11/07/2023, 9:59 PM

## 2023-11-07 NOTE — Anesthesia Preprocedure Evaluation (Addendum)
 Anesthesia Evaluation    Airway Mallampati: Intubated       Dental  (+) Poor Dentition, Missing   Pulmonary Patient abstained from smoking., former smoker    + decreased breath sounds      Cardiovascular hypertension, Pt. on medications and Pt. on home beta blockers +CHF  + dysrhythmias Atrial Fibrillation  Rhythm:Regular Rate:Normal  ECHO 02/25:   1. Left ventricular ejection fraction, by estimation, is 50 to 55% with beat to beat variability in atrial fibrillation. The left ventricle has low normal function. Left ventricular endocardial border not optimally defined to evaluate regional wall motion. There is mild concentric left ventricular hypertrophy. Left ventricular diastolic parameters are indeterminate. There is pseudodyskinesis of the inferolateral wall suggesting intraadominal pathology.  2. Right ventricular systolic function is mildly reduced. The right ventricular size is normal. Tricuspid regurgitation signal is inadequate for assessing PA pressure.  3. The mitral valve is grossly normal. Mild and eccentric mitral valve regurgitation, not optimally visualized due to image quality. No evidence of mitral stenosis.  4. The aortic valve is tricuspid. Aortic valve regurgitation is not visualized. No aortic stenosis is present.  5. Aortic dilatation noted. There is mild dilatation of the aortic root, measuring 43 mm.  6. The inferior vena cava is normal in size with <50% respiratory variability, suggesting right atrial pressure of 8 mmHg.   Conclusion(s)/Recommendation(s): No evidence of valvular vegetations on this transthoracic echocardiogram. Consider a transesophageal echocardiogram to exclude infective endocarditis if clinically indicated.    Neuro/Psych  Headaches  Anxiety Depression       GI/Hepatic ,GERD  Medicated,,(+)     substance abuse  alcohol useperforated vsicus with multyiple abscess in setting of E.faecalis  bacteremia   Endo/Other  negative endocrine ROS    Renal/GU   negative genitourinary   Musculoskeletal negative musculoskeletal ROS (+)    Abdominal  (+) + obese  Peds  Hematology  (+) Blood dyscrasia, anemia Lab Results      Component                Value               Date                      WBC                      15.8 (H)            11/07/2023                HGB                      5.6 (LL)            11/07/2023                HCT                      18.4 (L)            11/07/2023                MCV                      86.0                11/07/2023                PLT  348                 11/07/2023             Lab Results      Component                Value               Date                      NA                       141                 11/07/2023                K                        3.1 (L)             11/07/2023                CO2                      22                  11/07/2023                GLUCOSE                  114 (H)             11/07/2023                BUN                      59 (H)              11/07/2023                CREATININE               1.20                11/07/2023                CALCIUM                  7.4 (L)             11/07/2023                GFR                      45.40 (L)           01/14/2022                EGFR                     66                  04/22/2022                GFRNONAA                 >60                 11/07/2023  Anesthesia Other Findings  2/14 presented with abdominal pain and distention CT concerning for pneumoperitoneum for which surgery was consulted, patient underwent exploratory lap which revealed revealed diffuse colonic distention with ischemia and gangrenous colonic necrosis with scattered areas of perforation and massive flocculent peritonitis. OR for subtotal colectomy, intestinal discontinuity   2/15: con't on pressor support, ID consult   2/17:  exploratory laparotomy, end ileostomy  2/18: goal to wake up and wean, will need TPN   2/19: waking, gets tachypneic, tachycardic with SBT  2/20: failed SBT. Starting trickle feeds, lasix BID, PO oxy and klonopin   2/21: extubated.  2/26 fluid bolus and free water per tube added  2/26 reintubated for respiratory distress secondary to possible aspiration during abdominal CT scan.  Brought back to operating room for perforated sigmoid stump.  Underwent lysis of adhesions.  Excision of necrotic tissue and partial sigmoid resection.   Reproductive/Obstetrics                             Anesthesia Physical Anesthesia Plan  ASA: 3 and emergent  Anesthesia Plan: General   Post-op Pain Management: Celebrex PO (pre-op)* and Tylenol PO (pre-op)*   Induction: Intravenous and Rapid sequence  PONV Risk Score and Plan: 1 and Ondansetron, Dexamethasone, Midazolam and Treatment may vary due to age or medical condition  Airway Management Planned: Oral ETT  Additional Equipment: None  Intra-op Plan:   Post-operative Plan: Post-operative intubation/ventilation  Informed Consent:   Plan Discussed with: CRNA  Anesthesia Plan Comments:        Anesthesia Quick Evaluation

## 2023-11-07 NOTE — TOC CM/SW Note (Signed)
 Transition of Care [facility]) - Inpatient Brief Assessment   Patient Details  Name: Daniel Butler MRN: [id number] Date of Birth: 1966-04-30  Transition of Care [facility] CM/SW Contact:    Mearl Latin, LCSW Phone Number: 11/07/2023, 8:41 AM   Clinical Narrative: TOC continuing to follow. Patient is intubated.    Transition of Care Asessment: Insurance and Status: Insurance coverage has been reviewed Patient has primary care physician: Yes Ria Clock) Home environment has been reviewed: Facility resident; [facility] Prior level of function:: Unknown Prior/Current Home Services: No current home services Social Drivers of Health Review: SDOH reviewed needs interventions Readmission risk has been reviewed: Yes Transition of care needs: transition of care needs identified, TOC will continue to follow

## 2023-11-07 NOTE — Op Note (Signed)
 Pre-op Dx: Ischemic colitis, perforation, open abdomen Postop diagnosis: Same Procedure performed: Exploratory laparotomy, abdominal closure, placement of wound VAC (20 x 2 cm)  Surgeon:Chelly Dombeck K Cavion Faiola  Assistant: Dr. Violeta Gelinas Anesthesia: General Endotracheal Indications: This is a 58 year old male who has undergone multiple recent surgeries for ischemic colitis with perforation.  The most recent surgery was approximately 2 days ago when he developed necrosis of his distal stump involving the sigmoid colon.  He underwent resection of this area and his abdomen was left open with plans for a second look.  The patient is brought intubated from the intensive care unit down to the operating room.  He was placed in the supine position on the operating room table.  His VAC dressing and ostomy appliance were removed.  We prepped his entire abdomen with Betadine and draped in sterile fashion.  A timeout was taken to ensure the proper patient and proper procedure.  We remove the inner portion of the ABThera VAC dressing.  We then explored the entire abdomen.  His ileostomy appears viable.  We ran all the small bowel back to the ligament of Treitz and all of this appears to be viable with no interloop abscesses identified.  No signs of ischemia or perforation.  The stomach appears grossly normal.  The patient has a long Hartman's pouch extending to the proximal sigmoid colon.  This is marked by 2 blue Prolene sutures that were placed by Dr. Azucena Cecil at the last surgery.  This sigmoid colon stump appears viable with no sign of perforation or ischemia.  We irrigated the abdomen thoroughly and inspected for hemostasis.  A 19 French drain was brought in through a stab incision on the patient's left side and placed next to the sigmoid colon stump.  Our sponge and instrument count was correct.  We then reapproximated the fascia with double-stranded #1 PDS sutures.  The patient has an ischemic appearing umbilicus so we  proceeded with an embolectomy.  The subcutaneous tissues were irrigated and inspected for hemostasis.  We cut a large VAC sponge to fit the wound and this was dressed with an occlusive drape.  This was placed to suction with a good seal.  The patient is then transported back to the intensive care unit in critical condition.  All sponge, instrument, and needle counts are correct.  Daniel Butler. Corliss Skains, MD, [facility] Surgery  General Surgery   11/07/2023 1:37 PM

## 2023-11-07 NOTE — Progress Notes (Signed)
 PT Cancellation Note  Patient Details Name: Daniel Butler MRN: [id number] DOB: 09/02/1966   Cancelled Treatment:    Reason Eval/Treat Not Completed: Medical issues which prohibited therapy; noted plans to return to OR.  Will follow up on Monday 11/10/23.   Elray Mcgregor 11/07/2023, 9:16 AM Sheran Lawless, PT Acute Rehabilitation Services Office:[phone] 11/07/2023

## 2023-11-07 NOTE — Progress Notes (Signed)
 Pharmacy Antibiotic Note  Daniel Butler is a 58 y.o. male admitted on 10/24/2023 with perforated vsicus with multyiple abscess in setting of E.faecalis bacteremia.  Pharmacy has been consulted for vancomycin dosing.  Underwent exploratory laparotomy and found to have perforated sigmoid colon and multiple pockets of abscess now being drained w/ infected/necrotic appearing fascia.  Renal function improving, now afebrile, WBC downtrending.  Plan: Increase vanc to 1750mg  IV Q24H for AUC 490 using SCr 1.2 Zosyn 3.375gm IV Q8H per MD to end today 2/28 Monitor renal fxn, clinical progress, vanc levels pending LOT  Height: 6\' 1"  (185.4 cm) Weight: 128.5 kg (283 lb 4.7 oz) IBW/kg (Calculated) : 79.9  Temp (24hrs), Avg:98.8 F (37.1 C), Min:98.5 F (36.9 C), Max:99.7 F (37.6 C)  Recent Labs  Lab 11/04/23 0818 11/05/23 0633 11/05/23 1444 11/06/23 0009 11/06/23 0518 11/06/23 1710 11/07/23 0610  WBC 16.3* 19.0*  --  12.3* 12.5*  --  15.8*  CREATININE  --  1.88* 1.96* 1.95* 1.94*  --  1.20  LATICACIDVEN  --   --   --  2.0*  --  3.2*  3.2*  --     Estimated Creatinine Clearance: 94.2 mL/min (by C-G formula based on SCr of 1.2 mg/dL).    Allergies  Allergen Reactions   Zetia [Ezetimibe] Nausea And Vomiting   Zosyn 2/15 > 2/28 Micafungin 2/1/4>2/19 Hydrocort 2/15> Cefepime 2/14> 2/15 Vanc 2/14 x1, 2/26 >>  Flagyl 2/14>2/15  2/14 BCx: 1/4 E faecalis  2/14 MRSA PCR: positive 2/14 RVP - neg 2/15 BCx - negative  Idell Hissong D. Laney Potash, PharmD, BCPS, BCCCP 11/07/2023, 11:01 AM

## 2023-11-07 NOTE — Progress Notes (Signed)
 NAME:  Daniel Butler, MRN:  [id number], DOB:  Jul 20, 1966, LOS: 14 ADMISSION DATE:  10/24/2023, CONSULTATION DATE:  10/24/2023 REFERRING MD:  Dr. Bedelia Person - CCS, CHIEF COMPLAINT:  Bowel perf   History of Present Illness:  Daniel Butler is a 58 y.o. with a past medical history significant for orthostatic hypotension, chronic diastolic congestive heart failure, persistent atrial fibrillation on Eliquis, CKD stage IIIa, anemia of chronic disease, severe alcohol use disorder, chronic ulcer of the left foot, prior SAH secondary to aneurysm s/p clipping, alcohol use, and depression who presented to the ED 2/14 from [facility] and rehab with complaints of abdominal pain and swelling with associated nausea and small-volume emesis that began day prior to admission.  States he has felt unwell x 3 days.  On ED arrival patient was seen febrile with temperature 103.4, tachypneic, and tachycardic.  Lab work significant for K2.4, glucose 116, creatinine 3.12, anion gap 26, alkaline phosphate 33, albumin 2.5, lactic 10.3, WBC 19.6.  CT abdomen and pelvis obtained which revealed substantial free air concerning for acute bowel perforation.  Surgery consulted and patient underwent exploratory laparotomy which revealed diffuse colonic distention with ischemia and gangrenous colonic necrosis with scattered areas of perforation and massive flocculent peritonitis.  Postprocedure patient remained intubated and sedated, PCCM consulted for further management admission.  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 OR for subtotal colectomy, intestinal discontinuity  2/15: con't on pressor support, ID consult  2/17: exploratory laparotomy, end ileostomy 2/18: goal to wake up and wean, will need TPN  2/19: waking, gets tachypneic, tachycardic with SBT 2/20: failed SBT. Starting trickle feeds, lasix BID, PO oxy and klonopin  2/21: extubated. 2/26 fluid bolus and free water per tube added 2/26 reintubated for respiratory distress secondary to possible aspiration during abdominal CT scan.  Brought back to operating room for perforated sigmoid stump.  Underwent lysis of adhesions.  Excision of necrotic tissue and partial sigmoid resection.  Interim History / Subjective:  No acute events overnight.  Remains on vent, fentanyl, neo Arouses easily and denies pain. C/o nausea.    Objective   Blood pressure (!) 93/53, pulse 92, temperature 98.6 F (37 C), temperature source Axillary, resp. rate 18, height 6\' 1"  (1.854 m), weight 128.5 kg, SpO2 100%. CVP:  [4 mmHg-9 mmHg] 9 mmHg  SpO2: 100 % O2 Flow Rate (L/min): 15 L/min FiO2 (%): 40 %   Intake/Output Summary (Last 24 hours) at 11/07/2023 0803 Last data filed at 11/07/2023 0600 Gross per 24 hour  Intake 4850.47 ml  Output 2720 ml  Net 2130.47 ml   Filed Weights   11/05/23 0500 11/06/23 0500 11/07/23 0706  Weight: 117.9 kg 119 kg 128.5 kg   Physical Exam:  General: critically ill appearing male in NAD HENT:ETT, cortrak, and NG tube all in place. Poor dentition. [place]/AT.  Respiratory: Clear bilateral breath sounds. Minimal vent settings. Satting well.  Cardiovascular: IRIR, no  MRG. Trace lower extremity edema.  GI: Surgical incision with wound  vac in place. Ostomy draining liquid stool Extremities: No acute deformity or ROM limitation.  Neuro: Eyes open to voice. Nods head and gestures to answer questions.    14L positive for the admission. 2.2 L positive x 24 hours Tmax  99.7 WBC 15, Hgb 5.6, Plt 348 K 3.1, BUN 59, Cr 1.2 Lactic acid 3.2  Serous wound vac output.    Ancillary tests personally reviewed    Assessment & Plan:   Gangrenous Colitis  with enterococcus fecalis bacteremia. Status post initial colonic resection with ileostomy Now status post sigmoid resection and abscess drainage - Management per surgery - planning for return trip OR today - Holding TF - Monitor wound vac output.  - Pain well controlled with fentanyl infusion. - May need to consider TPN vs restarting TF.   Septic shock secondary peritonitis, e. Faecalis bacteremia.  - Blood cultures not negative - Continue Zosyn day 14 and Vancomycin day 3 - Phenylephrine infusion continue for MAP goal 65 - Resume midodrine when OK for PO.   Acute hypoxic respiratory failure secondary to above and possible aspiration. - Full vent support - Settings minimal - No plans for SBT today, returning to OR - Hopefully can work towards weaning tomorrow.  - VAP bundle - Fentanyl infusion for RASS goal -1 to -2.   Atrial fibrillation: (eliquis). Now difficult to control likely due to sepsis and critical illness.  Acute on chronic HFpEF. (LVEF 50-55%) - Hold lovenox following 2 gram drop in hemoglobin requiring transfusion - Will not reverse at this time.  - Amiodarone continue - 14L positive for the admission likely not totally accurate considering multiple abdominal surgeries.  - Consider diuresis when hemodynamics will better tolerate.   Acute kidney injury Hypokalemia - Creatinine improving, BUN lagging a bit - Adequate UOP - Replete K - Trend chemistries  Anemia: Hgb 12 at presentation. Over the course of the admission has been in the 7-9 range.  5.6 this morning. Suspect this acute drop is in the post-operative setting + lovenox. - transfuse 2 units PRBC in preparation for surgery today - Repeat H&H post transfusion - Hold tx dose lovenox  Severe protein calorie malnutrition - may need to consider re-starting TPN - Will defer to surgery if they think we can start trickles.   Severe deconditioning - Will need aggressive rehab when appropriate.     Best Practice (right click and "Reselect all SmartList Selections" daily)   Diet/type: NPO except for medications.  Restarting TPN. DVT prophylaxis DVT prophylaxis.  Hold heparin infusion while going back to operating room. Pressure ulcer(s): N/A GI prophylaxis: PPI Lines: Left subclavian, art line-both still necessary Foley: Foley catheter still necessary Code Status:  full code Last date of multidisciplinary goals of care discussion: updated wife at bedside 2/26  Critical care time  42 minutes   Joneen Roach, AGACNP-BC [facility]  See Amion for personal pager PCCM on call pager [phone] until 7pm. Please call Elink 7p-7a. [phone]  11/07/2023 8:31 AM

## 2023-11-07 NOTE — Progress Notes (Signed)
 SLP Cancellation Note  Patient Details Name: Daniel Butler MRN: [id number] DOB: May 19, 1966   Cancelled treatment:       Reason Eval/Treat Not Completed: Patient not medically ready. He remains intubated with plans to return to the OR today. Per CCM note, hopeful for weaning next date. SLP will continue following.    Gwynneth Aliment, M.A., CF-SLP Speech Language Pathology, Acute Rehabilitation Services  Secure Chat preferred [phone]  11/07/2023, 9:08 AM

## 2023-11-07 NOTE — Progress Notes (Signed)
 2 Days Post-Op   Subjective/Chief Complaint: Intubated. Eyes open to voice   Objective: Vital signs in last 24 hours: Temp:  [98.5 F (36.9 C)-99.7 F (37.6 C)] 99.4 F (37.4 C) (02/28 1041) Pulse Rate:  [84-119] 96 (02/28 1100) Resp:  [16-27] 20 (02/28 1100) BP: (80-128)/(33-88) 108/57 (02/28 1100) SpO2:  [81 %-100 %] 100 % (02/28 1100) Arterial Line BP: (75-131)/(44-69) 131/61 (02/28 1100) FiO2 (%):  [40 %] 40 % (02/28 1100) Weight:  [128.5 kg] 128.5 kg (02/28 0706) Last BM Date : 11/05/23  Intake/Output from previous day: 02/27 0701 - 02/28 0700 In: 5080.5 [I.V.:4640.9; NG/GT:50; IV Piggyback:389.6] Out: 2785 [Urine:1435; Emesis/NG output:650; Drains:700] Intake/Output this shift: Total I/O In: 602.4 [I.V.:279.9; Blood:298; IV Piggyback:24.5] Out: 360 [Urine:150; Drains:60; Stool:150]  Intubated, eyes open to voice Abd - soft, OGT output thin VAC intact with serous output Stool in ileostomy  Lab Results:  Recent Labs    11/06/23 0518 11/07/23 0610  WBC 12.5* 15.8*  HGB 7.5* 5.6*  HCT 25.5* 18.4*  PLT 397 348   BMET Recent Labs    11/06/23 0518 11/07/23 0610  NA 139 141  K 4.4 3.1*  CL 106 111  CO2 20* 22  GLUCOSE 100* 114*  BUN 66* 59*  CREATININE 1.94* 1.20  CALCIUM 7.5* 7.4*   PT/INR Recent Labs    11/07/23 0753  LABPROT 15.9*  INR 1.3*   ABG Recent Labs    11/05/23 2009  PHART 7.322*  HCO3 25.1    Studies/Results: Portable Chest xray Result Date: 11/06/2023 CLINICAL DATA:  Status post laparotomy. EXAM: PORTABLE CHEST 1 VIEW COMPARISON:  October 05, 2023. FINDINGS: Stable cardiomediastinal silhouette. Endotracheal, nasogastric and nutritional catheters are unchanged. Left subclavian catheter is unchanged. Bibasilar atelectasis or edema is noted with associated effusions. Bony thorax is unremarkable. IMPRESSION: Support apparatus as noted above. Stable bibasilar opacities as noted above. Electronically Signed   By: Lupita Raider M.D.    On: 11/06/2023 09:49   CT HEAD WO CONTRAST ( ) Result Date: 11/06/2023 CLINICAL DATA:  58 year old male with altered mental status, delirium. EXAM: CT HEAD WITHOUT CONTRAST TECHNIQUE: Contiguous axial images were obtained from the base of the skull through the vertex without intravenous contrast. RADIATION DOSE REDUCTION: This exam was performed according to the departmental dose-optimization program which includes automated exposure control, adjustment of the mA and/or kV according to patient size and/or use of iterative reconstruction technique. COMPARISON:  Head CT 08/31/2023. FINDINGS: Brain: Stable cerebral volume. Stable ventricle size and configuration. No midline shift, mass effect, or evidence of intracranial mass lesion. Chronic right frontotemporal encephalomalacia. Stable gray-white matter differentiation throughout the brain. Basilar cisterns remain patent. No acute intracranial hemorrhage identified. No cortically based acute infarct identified. 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 No suspicious intracranial vascular hyperdensity. Skull: Chronic right frontotemporal craniotomy, right vertex burr hole. No acute osseous abnormality identified. Sinuses/Orbits: Bilateral nasal enteric tubes are in place. Ethmoid sinus mucosal thickening has not significantly changed. New sphenoid sinus mucosal thickening and bubbly opacity. Tympanic cavities remain well aerated. Mild new left mastoid effusion. Other: No acute orbit or scalp soft tissue finding. IMPRESSION: 1. No acute intracranial abnormality. 2. 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 3. Bilateral nasoenteric tubes in place with mildly increased paranasal sinus and left mastoid inflammation. Electronically Signed   By: Odessa Fleming M.D.   On: 11/06/2023 04:48   Portable Chest x-ray Result Date: 11/05/2023 CLINICAL DATA:  Endotracheal tube EXAM: PORTABLE CHEST  1 VIEW COMPARISON:  Chest  x-ray 10/25/2023 FINDINGS: Endotracheal tube tip is 5.8 cm above the carina. Left-sided central venous catheter tip projects over the SVC. Enteric tube tip is in the gastric fundus. The heart is enlarged. There is a small left pleural effusion with left basilar opacities. No pneumothorax or acute fracture. IMPRESSION: 1. Endotracheal tube tip is 5.8 cm above the carina. 2. Small left pleural effusion with left basilar opacities, likely atelectasis. Electronically Signed   By: Darliss Cheney M.D.   On: 11/05/2023 19:27   CT ABDOMEN PELVIS W CONTRAST Result Date: 11/05/2023 CLINICAL DATA:  Postoperative abdominal pain, recent colon resection and ileostomy EXAM: CT ABDOMEN AND PELVIS WITH CONTRAST TECHNIQUE: Multidetector CT imaging of the abdomen and pelvis was performed using the standard protocol following bolus administration of intravenous contrast. RADIATION DOSE REDUCTION: This exam was performed according to the departmental dose-optimization program which includes automated exposure control, adjustment of the mA and/or kV according to patient size and/or use of iterative reconstruction technique. CONTRAST:  58mL OMNIPAQUE IOHEXOL 350 MG/ML SOLN COMPARISON:  10/24/2023, 11/03/2023 FINDINGS: Lower chest: There are small bilateral pleural effusions, with dense consolidation and volume loss within the lower lobes consistent with atelectasis. Hepatobiliary: No focal liver abnormalities. High attenuation material within the gallbladder consistent with sludge or small gallstones. No evidence of acute cholecystitis. No biliary duct dilation. Pancreas: Unremarkable. No pancreatic ductal dilatation or surrounding inflammatory changes. Spleen: Normal in size without focal abnormality. Adrenals/Urinary Tract: Adrenal glands are unremarkable. Kidneys are normal, without renal calculi, focal lesion, or hydronephrosis. Bladder is decompressed, limiting its evaluation. Stomach/Bowel: Postsurgical changes are seen from  subtotal colectomy and right-sided ileostomy. The Hartmann pouch is distended and fluid-filled. Enteric catheter is coiled within the gastric lumen. Oral contrast is seen within the stomach and proximal small bowel. There is diffuse small bowel distension, most pronounced within the proximal jejunum measuring 4.1 cm. No transition point is visualized, under scattered gas fluid levels throughout the small bowel most compatible with postoperative ileus. There is a large amount of extraluminal gas and fluid throughout the abdomen and pelvis, suggesting perforated viscus. The exact site of perforation is not identified. The suture line at the [facility] pouch appears intact. No rim enhancement to suggest abscess at this time. Vascular/Lymphatic: Aortic atherosclerosis. No enlarged abdominal or pelvic lymph nodes. Reproductive: Prostate is unremarkable. Other: Diffuse extraluminal gas and fluid throughout the abdomen and pelvis, most compatible with perforated viscus. The exact site of perforation is not identified, and surgical consultation is recommended. Postsurgical changes from midline laparotomy. There is extension of the extraluminal gas into the laparotomy incision line just superior to the umbilicus, suggesting developing fistula. No abdominal wall hernia. Musculoskeletal: No acute or destructive bony abnormalities. Reconstructed images demonstrate no additional findings. IMPRESSION: 1. Postsurgical changes from subtotal colectomy, Hartmann's pouch, and diverting ileostomy within the right mid abdomen. 2. Extensive extraluminal gas and fluid throughout the abdomen and pelvis, most compatible with perforated viscus. No peripheral enhancement to suggest abscess at this time. 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 Surgical consultation recommended. 3. Mild diffuse small bowel distension with  multiple gas fluid levels suggesting postoperative ileus. 4. Small bilateral pleural effusions, with dense bilateral lower lobe atelectasis. 5. Gallbladder sludge without evidence of acute cholecystitis. 6. Enteric catheter tip within the gastric lumen at the fundus. 7.  Aortic Atherosclerosis (ICD10-I70.0). Critical Value/emergent results were called by telephone  at the time of interpretation on 11/05/2023 at 7:07 pm to charge nurse, Amy, who verbally acknowledged these results. The charge nurse will relay the message to the physician covering the surgical service. Electronically Signed   By: Sharlet Salina M.D.   On: 11/05/2023 19:12    Anti-infectives: Anti-infectives (From admission, onward)    Start     Dose/Rate Route Frequency Ordered Stop   11/07/23 2000  vancomycin (VANCOREADY) IVPB 1750 mg/350 mL        1,750 mg 175 mL/hr over 120 Minutes Intravenous Every 24 hours 11/07/23 1102     11/06/23 0000  vancomycin (VANCOREADY) IVPB 1250 mg/250 mL  Status:  Discontinued        1,250 mg 166.7 mL/hr over 90 Minutes Intravenous Every 24 hours 11/05/23 2320 11/07/23 1102   10/25/23 1400  piperacillin-tazobactam (ZOSYN) IVPB 3.375 g        3.375 g 12.5 mL/hr over 240 Minutes Intravenous Every 8 hours 10/25/23 0942 11/07/23 2359   10/25/23 0000  ceFEPIme (MAXIPIME) 2 g in sodium chloride 0.9 % 100 mL IVPB  Status:  Discontinued        2 g 200 mL/hr over 30 Minutes Intravenous Every 12 hours 10/24/23 1853 10/25/23 0942   10/24/23 2300  piperacillin-tazobactam (ZOSYN) IVPB 3.375 g  Status:  Discontinued        3.375 g 12.5 mL/hr over 240 Minutes Intravenous Every 8 hours 10/24/23 1509 10/24/23 1853   10/24/23 2300  metroNIDAZOLE (FLAGYL) IVPB 500 mg  Status:  Discontinued        500 mg 100 mL/hr over 60 Minutes Intravenous Every 12 hours 10/24/23 1838 10/25/23 0942   10/24/23 2000  micafungin (MYCAMINE) 100 mg in sodium chloride 0.9 % 100 mL IVPB  Status:  Discontinued        100 mg 105 mL/hr  over 1 Hours Intravenous Every 24 hours 10/24/23 1838 10/29/23 1152   10/24/23 1945  vancomycin (VANCOREADY) IVPB 2000 mg/400 mL        2,000 mg 200 mL/hr over 120 Minutes Intravenous NOW 10/24/23 1854 10/24/23 2239   10/24/23 1854  vancomycin variable dose per unstable renal function (pharmacist dosing)  Status:  Discontinued         Does not apply See admin instructions 10/24/23 1854 10/25/23 0942   10/24/23 1245  ceFEPIme (MAXIPIME) 2 g in sodium chloride 0.9 % 100 mL IVPB        2 g 200 mL/hr over 30 Minutes Intravenous  Once 10/24/23 1236 10/24/23 1334   10/24/23 1245  metroNIDAZOLE (FLAGYL) IVPB 500 mg        500 mg 100 mL/hr over 60 Minutes Intravenous  Once 10/24/23 1236 10/24/23 1453       Assessment/Plan: -S/p ex lap with subtotal abdominal colectomy for free air/ ischemic colitis (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 Magnus Ivan 2/17 -s/p exploratory laparotomy/ LOA/ resection of additional sigmoid colon stump/ debridement of abdominal wall 11/05/23   - Tfs held for OR today. Had been tolerating yesterday and OGT output thin with very little ON per RN - OR today for exploration/ possible bowel resection/ possible abdominal closure and VAC placement. - continue IV abx -Continue therapies for deconditioning -OK to anticoagulate for a fib with short-acting anticoagulation   FEN: NPO - SLP following. VTE: therapeutic lovenox ID: [email or web address] 2/14. zosyn 2/15>>    LOS: 14 days    Eric Form, [facility]  Surgery 11/07/2023, 11:51 AM Please see Amion for pager number during day hours 7:00am-4:30pm

## 2023-11-07 NOTE — Progress Notes (Signed)
 PHARMACY - TOTAL PARENTERAL NUTRITION CONSULT NOTE   Indication: Colonic ischemia   Patient Measurements: Height: 6\' 1"  (185.4 cm) Weight: 119 kg (262 lb 5.6 oz) IBW/kg (Calculated) : 79.9 TPN AdjBW (KG): 86.6 Body mass index is 34.61 kg/m. Usual Weight: ~250 lbs   Assessment:  Patient admitted 2/14 with septic shock in the setting of bowel perforation and enterococcus bacteremia. TTE on 2/16 negative for vegetations. In OR, patient found to have diffuse colonic distention with ischemia and gangrenous colonic necrosis with scattered areas of perforation and massive flocculent peritonitis. Due to prolonged ileus postoperatively, patient was on TPN 2/18- 2/22 and weaned off when tolerating tube feeds. On 2/26, patient went back to OR and was found to have perforating sigmoid stump. Stump appeared necrotic likely from initial process causing colonic ischemia. Planning return to OR 2/28  Pharmacy consulted for TPN overnight 2/27- 2/28. Per surgery 2/27 note, may restart TF. This was held due to NGT output. Per discussion with CCM 2/28, ok to hold TPN for now given possible TF restart after surgery.    Glucose / Insulin:  Electrolytes:  Renal:  Hepatic:  Intake / Output; MIVF:  GI Imaging: 2/14 CT Ab/P - Substantial free intraperitoneal gas with ascites, exact site of perforation not well seen although colonic etiology favored given the diffuse colonic distension 2/26 CT: Hartmann pouch distended and fluid filled, diffuse SB distension, likely postop ileus; large extraluminal gas and fluid suggesting perforated viscus; possible developing fistula  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; ABThera wound VAC application  2/17 exploratory laparotomy, end ileostomy, fascia closure  2/26 lysis of adhesions. Excision of necrotic tissue and partial sigmoid resection.  2/28 exploration, possible bowel resection/abdominal  closure/VAC placement.   Central access:  TPN start date:   Nutritional Goals:   RD Assessment: Estimated Needs Total Energy Estimated Needs: 2200-2400 Total Protein Estimated Needs: 130-150 gm Total Fluid Estimated Needs: 2.2-2.4 L  Current Nutrition:    Plan:  Hold TPN for now per above  Alphia Moh, PharmD, BCPS, BCCP Clinical Pharmacist  Please check AMION for all [facility] phone numbers After 10:00 PM, call Main Pharmacy [phone]

## 2023-11-07 NOTE — Transfer of Care (Signed)
 Immediate Anesthesia Transfer of Care Note  Patient: Daniel Butler  Procedure(s) Performed: EXPLORATORY LAPAROTOMY, ABDOMINAL FASCIAL CLOSURE, WOUND VAC CHANGE (Abdomen)  Patient Location: ICU  Anesthesia Type:General  Level of Consciousness: drowsy  Airway & Oxygen Therapy: Patient remains intubated per anesthesia plan  Post-op Assessment: Report given to RN and Post -op Vital signs reviewed and stable  Post vital signs: Reviewed and stable  Last Vitals:  Vitals Value Taken Time  BP 101/47 11/07/23 1339  Temp    Pulse 88 11/07/23 1357  Resp 20 11/07/23 1357  SpO2 95 % 11/07/23 1357  Vitals shown include unfiled device data.  Last Pain:  Vitals:   11/07/23 1200  TempSrc: Axillary  PainSc:          Complications: No notable events documented.

## 2023-11-07 NOTE — Anesthesia Postprocedure Evaluation (Signed)
 Anesthesia Post Note  Patient: Daniel Butler  Procedure(s) Performed: EXPLORATORY LAPAROTOMY, ABDOMINAL FASCIAL CLOSURE, WOUND VAC CHANGE (Abdomen)     Patient location during evaluation: SICU Anesthesia Type: General Level of consciousness: sedated Pain management: pain level controlled Vital Signs Assessment: post-procedure vital signs reviewed and stable Respiratory status: patient remains intubated per anesthesia plan Cardiovascular status: stable Postop Assessment: no apparent nausea or vomiting Anesthetic complications: no   No notable events documented.  Last Vitals:  Vitals:   11/07/23 1100 11/07/23 1200  BP: (!) 108/57   Pulse: 96   Resp: 20   Temp:  37.3 C  SpO2: 100%     Last Pain:  Vitals:   11/07/23 1200  TempSrc: Axillary  PainSc:                  Daniel Butler

## 2023-11-08 ENCOUNTER — Inpatient Hospital Stay (HOSPITAL_COMMUNITY)

## 2023-11-08 DIAGNOSIS — K659 Peritonitis, unspecified: Secondary | ICD-10-CM | POA: Diagnosis not present

## 2023-11-08 DIAGNOSIS — R6521 Severe sepsis with septic shock: Secondary | ICD-10-CM | POA: Diagnosis not present

## 2023-11-08 DIAGNOSIS — J9601 Acute respiratory failure with hypoxia: Secondary | ICD-10-CM | POA: Diagnosis not present

## 2023-11-08 DIAGNOSIS — A419 Sepsis, unspecified organism: Secondary | ICD-10-CM | POA: Diagnosis not present

## 2023-11-08 LAB — BASIC METABOLIC PANEL
Anion gap: 11 (ref 5–15)
BUN: 47 mg/dL — ABNORMAL HIGH (ref 6–20)
CO2: 19 mmol/L — ABNORMAL LOW (ref 22–32)
Calcium: 7.8 mg/dL — ABNORMAL LOW (ref 8.9–10.3)
Chloride: 116 mmol/L — ABNORMAL HIGH (ref 98–111)
Creatinine, Ser: 1.3 mg/dL — ABNORMAL HIGH (ref 0.61–1.24)
GFR, Estimated: >60 mL/min (ref >60)
Glucose, Bld: 93 mg/dL (ref 70–99)
Potassium: 3.3 mmol/L — ABNORMAL LOW (ref 3.5–5.1)
Sodium: 146 mmol/L — ABNORMAL HIGH (ref 135–145)

## 2023-11-08 LAB — POCT I-STAT 7, (LYTES, BLD GAS, ICA,H+H)
Acid-base deficit: 5 mmol/L — ABNORMAL HIGH (ref 0.0–2.0)
Acid-base deficit: 7 mmol/L — ABNORMAL HIGH (ref 0.0–2.0)
Bicarbonate: 19.8 mmol/L — ABNORMAL LOW (ref 20.0–28.0)
Bicarbonate: 20.4 mmol/L (ref 20.0–28.0)
Calcium, Ion: 1.2 mmol/L (ref 1.15–1.40)
Calcium, Ion: 1.22 mmol/L (ref 1.15–1.40)
HCT: 21 % — ABNORMAL LOW (ref 39.0–52.0)
HCT: 24 % — ABNORMAL LOW (ref 39.0–52.0)
Hemoglobin: 7.1 g/dL — ABNORMAL LOW (ref 13.0–17.0)
Hemoglobin: 8.2 g/dL — ABNORMAL LOW (ref 13.0–17.0)
O2 Saturation: 77 %
O2 Saturation: 81 %
Patient temperature: 98.5
Patient temperature: 98.4
Potassium: 3.2 mmol/L — ABNORMAL LOW (ref 3.5–5.1)
Potassium: 3.6 mmol/L (ref 3.5–5.1)
Sodium: 147 mmol/L — ABNORMAL HIGH (ref 135–145)
Sodium: 148 mmol/L — ABNORMAL HIGH (ref 135–145)
TCO2: 21 mmol/L — ABNORMAL LOW (ref 22–32)
TCO2: 22 mmol/L (ref 22–32)
pCO2 arterial: 36.4 mmHg (ref 32–48)
pCO2 arterial: 51.9 mmHg — ABNORMAL HIGH (ref 32–48)
pH, Arterial: 7.342 — ABNORMAL LOW (ref 7.35–7.45)
pH, Arterial: 7.202 — ABNORMAL LOW (ref 7.35–7.45)
pO2, Arterial: 43 mmHg — ABNORMAL LOW (ref 83–108)
pO2, Arterial: 55 mmHg — ABNORMAL LOW (ref 83–108)

## 2023-11-08 LAB — TYPE AND SCREEN
ABO/RH(D): A NEG
Antibody Screen: NEGATIVE
Unit division: 0
Unit division: 0

## 2023-11-08 LAB — BPAM RBC
Blood Product Expiration Date: 202503082359
Blood Product Expiration Date: 202503082359
ISSUE DATE / TIME: 202502280827
ISSUE DATE / TIME: 202502281017
Unit Type and Rh: 600
Unit Type and Rh: 600

## 2023-11-08 LAB — GLUCOSE, CAPILLARY
Glucose-Capillary: 55 mg/dL — ABNORMAL LOW (ref 70–99)
Glucose-Capillary: 65 mg/dL — ABNORMAL LOW (ref 70–99)
Glucose-Capillary: 70 mg/dL (ref 70–99)
Glucose-Capillary: 73 mg/dL (ref 70–99)
Glucose-Capillary: 78 mg/dL (ref 70–99)
Glucose-Capillary: 86 mg/dL (ref 70–99)
Glucose-Capillary: 86 mg/dL (ref 70–99)
Glucose-Capillary: 88 mg/dL (ref 70–99)

## 2023-11-08 LAB — CBC
HCT: 23.2 % — ABNORMAL LOW (ref 39.0–52.0)
HCT: 26.4 % — ABNORMAL LOW (ref 39.0–52.0)
Hemoglobin: 7.3 g/dL — ABNORMAL LOW (ref 13.0–17.0)
Hemoglobin: 8.3 g/dL — ABNORMAL LOW (ref 13.0–17.0)
MCH: 27.4 pg (ref 26.0–34.0)
MCH: 27.9 pg (ref 26.0–34.0)
MCHC: 31.5 g/dL (ref 30.0–36.0)
MCHC: 31.4 g/dL (ref 30.0–36.0)
MCV: 87.2 fL (ref 80.0–100.0)
MCV: 88.9 fL (ref 80.0–100.0)
Platelets: 280 10*3/uL (ref 150–400)
Platelets: 303 10*3/uL (ref 150–400)
RBC: 2.66 MIL/uL — ABNORMAL LOW (ref 4.22–5.81)
RBC: 2.97 MIL/uL — ABNORMAL LOW (ref 4.22–5.81)
RDW: 18.4 % — ABNORMAL HIGH (ref 11.5–15.5)
RDW: 18.8 % — ABNORMAL HIGH (ref 11.5–15.5)
WBC: 11 10*3/uL — ABNORMAL HIGH (ref 4.0–10.5)
WBC: 15.1 10*3/uL — ABNORMAL HIGH (ref 4.0–10.5)
nRBC: 1.5 % — ABNORMAL HIGH (ref 0.0–0.2)
nRBC: 2 % — ABNORMAL HIGH (ref 0.0–0.2)

## 2023-11-08 LAB — PHOSPHORUS: Phosphorus: 3.4 mg/dL (ref 2.5–4.6)

## 2023-11-08 LAB — MAGNESIUM: Magnesium: 2.1 mg/dL (ref 1.7–2.4)

## 2023-11-08 LAB — COMPREHENSIVE METABOLIC PANEL
ALT: 20 U/L (ref 0–44)
AST: 65 U/L — ABNORMAL HIGH (ref 15–41)
Albumin: <1.5 g/dL — ABNORMAL LOW (ref 3.5–5.0)
Alkaline Phosphatase: 58 U/L (ref 38–126)
Anion gap: 10 (ref 5–15)
BUN: 50 mg/dL — ABNORMAL HIGH (ref 6–20)
CO2: 18 mmol/L — ABNORMAL LOW (ref 22–32)
Calcium: 7.6 mg/dL — ABNORMAL LOW (ref 8.9–10.3)
Chloride: 117 mmol/L — ABNORMAL HIGH (ref 98–111)
Creatinine, Ser: 1.58 mg/dL — ABNORMAL HIGH (ref 0.61–1.24)
GFR, Estimated: 50 mL/min — ABNORMAL LOW (ref >60)
Glucose, Bld: 98 mg/dL (ref 70–99)
Potassium: 3.4 mmol/L — ABNORMAL LOW (ref 3.5–5.1)
Sodium: 145 mmol/L (ref 135–145)
Total Bilirubin: 2.1 mg/dL — ABNORMAL HIGH (ref 0.0–1.2)
Total Protein: 4.6 g/dL — ABNORMAL LOW (ref 6.5–8.1)

## 2023-11-08 LAB — PROTIME-INR
INR: 1.2 (ref 0.8–1.2)
Prothrombin Time: 15 s (ref 11.4–15.2)

## 2023-11-08 LAB — LACTIC ACID, PLASMA: Lactic Acid, Venous: 1.4 mmol/L (ref 0.5–1.9)

## 2023-11-08 MED ORDER — AMIODARONE LOAD VIA INFUSION
150.0000 mg | Freq: Once | INTRAVENOUS | Status: AC
  Administered 2023-11-08: 150 mg via INTRAVENOUS
  Filled 2023-11-08: qty 83.34

## 2023-11-08 MED ORDER — AMIODARONE HCL IN DEXTROSE 360-4.14 MG/200ML-% IV SOLN
30.0000 mg/h | INTRAVENOUS | Status: DC
  Administered 2023-11-08 – 2023-11-09 (×3): 30 mg/h via INTRAVENOUS
  Filled 2023-11-08 (×4): qty 200

## 2023-11-08 MED ORDER — FREE WATER
200.0000 mL | Freq: Once | Status: AC
  Administered 2023-11-08: 200 mL

## 2023-11-08 MED ORDER — ENOXAPARIN SODIUM 120 MG/0.8ML IJ SOSY
120.0000 mg | PREFILLED_SYRINGE | Freq: Two times a day (BID) | INTRAMUSCULAR | Status: DC
  Administered 2023-11-08 – 2023-11-10 (×5): 120 mg via SUBCUTANEOUS
  Filled 2023-11-08 (×5): qty 0.8

## 2023-11-08 MED ORDER — POTASSIUM CHLORIDE 20 MEQ PO PACK
40.0000 meq | PACK | Freq: Once | ORAL | Status: AC
  Administered 2023-11-08: 40 meq
  Filled 2023-11-08: qty 2

## 2023-11-08 MED ORDER — PHENYLEPHRINE HCL-NACL 20-0.9 MG/250ML-% IV SOLN
0.0000 ug/min | INTRAVENOUS | Status: DC
  Administered 2023-11-08: 100 ug/min via INTRAVENOUS
  Administered 2023-11-08 (×2): 140 ug/min via INTRAVENOUS
  Administered 2023-11-09: 130 ug/min via INTRAVENOUS
  Administered 2023-11-09: 140 ug/min via INTRAVENOUS
  Administered 2023-11-09: 100 ug/min via INTRAVENOUS
  Administered 2023-11-09: 140 ug/min via INTRAVENOUS
  Administered 2023-11-09: 130 ug/min via INTRAVENOUS
  Filled 2023-11-08 (×8): qty 250

## 2023-11-08 MED ORDER — AMIODARONE HCL IN DEXTROSE 360-4.14 MG/200ML-% IV SOLN
60.0000 mg/h | INTRAVENOUS | Status: AC
  Administered 2023-11-08 (×2): 60 mg/h via INTRAVENOUS
  Filled 2023-11-08: qty 200

## 2023-11-08 MED ORDER — DEXTROSE 50 % IV SOLN
12.5000 g | INTRAVENOUS | Status: AC
  Administered 2023-11-08: 12.5 g via INTRAVENOUS
  Filled 2023-11-08: qty 50

## 2023-11-08 MED ORDER — LACTATED RINGERS IV BOLUS
1000.0000 mL | Freq: Once | INTRAVENOUS | Status: AC
  Administered 2023-11-08: 1000 mL via INTRAVENOUS

## 2023-11-08 MED ORDER — NOREPINEPHRINE 4 MG/250ML-% IV SOLN: 0.0000 ug/min | INTRAVENOUS | Status: DC

## 2023-11-08 MED ORDER — POTASSIUM CHLORIDE 10 MEQ/50ML IV SOLN
10.0000 meq | INTRAVENOUS | Status: AC
  Administered 2023-11-08 – 2023-11-09 (×4): 10 meq via INTRAVENOUS
  Filled 2023-11-08 (×4): qty 50

## 2023-11-08 MED ORDER — FREE WATER
400.0000 mL | Freq: Four times a day (QID) | Status: DC
  Administered 2023-11-08: 400 mL

## 2023-11-08 NOTE — Progress Notes (Signed)
 PHARMACY - ANTICOAGULATION CONSULT NOTE  Pharmacy Consult for Lovenox   Indication: atrial fibrillation  Allergies  Allergen Reactions   Zetia [Ezetimibe] Nausea And Vomiting    Patient Measurements: Height: 6\' 1"  (185.4 cm) Weight: 128 kg (282 lb 3 oz) IBW/kg (Calculated) : 79.9  Vital Signs: Temp: 98.1 F (36.7 C) (03/01 0800) Temp Source: Axillary (03/01 0800) BP: 110/66 (03/01 0900) Pulse Rate: 119 (03/01 0900)  Labs: Recent Labs    11/06/23 0518 11/07/23 0610 11/07/23 0753 11/07/23 1415 11/07/23 1719 11/08/23 0118 11/08/23 0539  HGB 7.5* 5.6*  --  7.3*  --  7.1* 7.3*  HCT 25.5* 18.4*  --  22.6*  --  21.0* 23.2*  PLT 397 348  --   --   --   --  280  LABPROT  --   --  15.9*  --   --   --  15.0  INR  --   --  1.3*  --   --   --  1.2  CREATININE 1.94* 1.20  --   --  1.34*  --  1.30*    Estimated Creatinine Clearance: 86.8 mL/min (A) (by C-G formula based on SCr of 1.3 mg/dL (H)).   Assessment: 58 y.o. M on apixaban PTA for afib, now held for procedures. Pharmacy consulted for enoxaparin.    Enoxparin held for OR 2/28. Ok to restart per surgery 3/1. Received 2 units RBC 2/28> Hemoglobin 7.3 x2. Platelets wnl.  Weight trending up, likely fluid. Will round to closest syringe.   Goal of Therapy:  Anti-Xa level 0.6-1 units/ml 4hrs after LMWH dose given Monitor platelets by anticoagulation protocol: Yes   Plan:  Restart Lovenox 120 mg SQ Q12H Will f/u CBC Q Mon/Thur and renal fx   Alphia Moh, PharmD, BCPS, BCCP Clinical Pharmacist  Please check AMION for all [facility] Pharmacy phone numbers After 10:00 PM, call Main Pharmacy [phone]

## 2023-11-08 NOTE — Progress Notes (Addendum)
 1400:  CCM paged about incremental increase in vent setting as patient sating low 90s.  ABG ordered.  RT made aware and adjusted setting per MD.  Patient vomiting tube feeds.  Tube feeds stopped and LWS started and 800 ml output.     1700:  CCM made aware that pressor requirement increase within the last hour.  LR bolus started and CCM at bedside to assess.

## 2023-11-08 NOTE — Progress Notes (Signed)
 PCCM note  Remains in A-fib through the day with heart rate in the 140s.  Requirements are up Started on amnio drip and bolus Will change Levophed to Neo-Synephrine Give 1 L bolus LR Recheck labs, lactic acid  The patient is critically ill with multiple organ system failure and requires high complexity decision making for assessment and support, frequent evaluation and titration of therapies, advanced monitoring, review of radiographic studies and interpretation of complex data.   Critical Care Time devoted to patient care services, exclusive of separately billable procedures, described in this note is 35 minutes.   Chilton Greathouse MD [facility] See Amion for pager  If no response to pager , please call [phone] until 7pm After 7:00 pm call Elink  [phone] 11/08/2023, 5:17 PM

## 2023-11-08 NOTE — Progress Notes (Signed)
 SLP Cancellation Note  Patient Details Name: Daniel Butler MRN: [id number] DOB: [birth year]   Cancelled treatment:       Reason Eval/Treat Not Completed: Patient not medically ready; remains on mechanical ventilation; will continue to monitor for readiness   Ardyth Gal MA, CCC-SLP Acute Rehabilitation Services   11/08/2023, 9:24 AM

## 2023-11-08 NOTE — Progress Notes (Signed)
 Repeat ABG ~0300 not crossing over.  Of note, PaO2 103 on 100%.  FiO2 adjusted to 80%

## 2023-11-08 NOTE — Progress Notes (Signed)
 NAME:  Daniel Butler, MRN:  [id number], DOB:  14-Jul-1966, LOS: 15 ADMISSION DATE:  10/24/2023, CONSULTATION DATE:  10/24/2023 REFERRING MD:  Dr. Bedelia Person - CCS, CHIEF COMPLAINT:  Bowel perf   History of Present Illness:  Daniel Butler is a 58 y.o. 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  States he has felt unwell x 3 days.  On ED arrival patient was seen febrile with temperature 103.4, tachypneic, and tachycardic.  Lab work significant for K2.4, glucose 116, creatinine 3.12, anion gap 26, alkaline phosphate 33, albumin 2.5, lactic 10.3, WBC 19.6.  CT abdomen and pelvis obtained which revealed substantial free air concerning for acute bowel perforation.  Surgery consulted and patient underwent exploratory laparotomy which revealed diffuse colonic distention with ischemia and gangrenous colonic necrosis with scattered areas of perforation and massive flocculent peritonitis.  Postprocedure patient remained intubated and sedated, PCCM consulted for further management admission.  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 OR for subtotal colectomy, intestinal discontinuity  2/15: con't on pressor support, ID consult  2/17: exploratory laparotomy, end ileostomy 2/18: goal to wake up and wean, will need TPN  2/19: waking, gets tachypneic, tachycardic with SBT 2/20: failed SBT. Starting trickle feeds, lasix BID, PO oxy and klonopin  2/21: extubated. 2/26 fluid bolus and free water per tube added 2/26 reintubated for respiratory distress secondary to possible aspiration during abdominal CT scan.  Brought back to operating room for perforated sigmoid stump.  Underwent lysis of adhesions.  Excision of necrotic tissue and partial sigmoid resection.  Interim History / Subjective:   Went back to the OR for fascial closure Remains on Neo-Synephrine, vasopressin Heart rate in the 120s in A-fib. Desatted to 100% but now FiO2 is weaning down   Objective   Blood pressure (!) 110/56, pulse (!) 108, temperature 98.1 F (36.7 C), temperature source Axillary, resp. rate 18, height 6\' 1"  (1.854 m), weight 128 kg, SpO2 97%. CVP:  [5 mmHg-48 mmHg] 7 mmHg  SpO2: 97 % O2 Flow Rate (L/min): 15 L/min FiO2 (%): (S) 50 %   Intake/Output Summary (Last 24 hours) at 11/08/2023 0833 Last data filed at 11/08/2023 0700 Gross per 24 hour  Intake 3953.68 ml  Output 2390 ml  Net 1563.68 ml   Filed Weights   11/06/23 0500 11/07/23 0706 11/08/23 0500  Weight: 119 kg 128.5 kg 128 kg   Physical Exam:  Gen:      No acute distress HEENT:  EOMI, sclera anicteric, ET tube Neck:  No masses; no thyromegaly Lungs:    Rhonchi CV:        Irregular, tachycardia Abd:   Abdominal wound VAC Ext:    No edema; adequate peripheral  perfusion Skin:      Warm and dry; no rash Neuro: Sedated  Lab/imaging reviewed Significant for sodium 146, potassium 3.3 BUN/creatinine 47/1.30 WBC 11.0, hemoglobin 7.3 Chest x-ray with stable bibasilar infiltrates   Ancillary tests personally reviewed    Assessment & Plan:   Gangrenous Colitis  with enterococcus fecalis bacteremia. Status post initial colonic resection with ileostomy Now status post sigmoid resection and abscess drainage - Management per surgery -Holding tube feeds at 20 - Monitor wound vac output.  - Pain well controlled with fentanyl infusion.  Septic shock secondary peritonitis, e. Faecalis bacteremia.  - Blood cultures not negative -Continue Zosyn for 14 days, on vancomycin -Phenylephrine to norepinephrine, wean down as tolerated.  Continue vasopressin - Resume midodrine tomorrow  Acute hypoxic respiratory failure secondary to above and possible aspiration. Continue ventilator, wean down FiO2 as tolerated No plans for SBT today given oxygen requirements Chest x-ray reviewed - VAP bundle - Fentanyl infusion for RASS goal -1 to -2.   Atrial fibrillation: (eliquis). Now difficult to control likely due to sepsis and critical illness.  Acute on chronic HFpEF. (LVEF 50-55%) - Hold lovenox following 2 gram drop in hemoglobin requiring transfusion.  May resume tomorrow if CBC continues to be stable -Amiodarone IV - 14L positive for the admission likely not totally accurate considering multiple abdominal surgeries.  - Consider diuresis when hemodynamics will better tolerate.   Acute kidney injury Hypokalemia -Monitor urine output and creatinine  Anemia: Hgb 12 at presentation. Over the course of the admission has been in the 7-9 range. 5.6 this morning. Suspect this acute drop is in the post-operative setting + lovenox. S/p 2 units PRBC on 2/28.  No evidence of active bleed.  Monitor labs Hold tx dose lovenox  Severe protein calorie malnutrition  present on admission -Tube feeds - Will defer to surgery if they think we can start trickles.   Severe deconditioning - Will need aggressive rehab when appropriate.   Best Practice (right click and "Reselect all SmartList Selections" daily)   Diet/type: Tube feeds DVT prophylaxis DVT prophylaxis.  Hold anticoagulation given drop in hemoglobin Pressure ulcer(s): N/A GI prophylaxis: PPI Lines: Left subclavian, art line-both still necessary Foley: Foley catheter still necessary Code Status:  full code Last date of multidisciplinary goals of care discussion: updated wife at bedside 2/26  The patient is critically ill with multiple organ system failure and requires high complexity decision making for assessment and support, frequent evaluation and titration of therapies, advanced monitoring, review of radiographic studies and interpretation of complex data.   Critical Care Time devoted to patient care services, exclusive of separately billable procedures, described in this note is 35 minutes.   Chilton Greathouse MD [place] [facility] See Amion for pager  If no response to pager , please call [phone] until 7pm After 7:00 pm call Elink  [phone] 11/08/2023, 8:51 AM

## 2023-11-08 NOTE — Progress Notes (Signed)
 eLink Physician-Brief Progress Note Patient Name: Daniel Butler DOB: [birth year] MRN: [id number]   Date of Service  11/08/2023  HPI/Events of Note  Notified of A fib. Not new. Rates currently 116-120. Was in 140s earlier per CCM note. Is on amiodarone, vaso and neo (140 mic). Levo ws stopped. Seen on camera. Is on 100% fio2 and peep 8, has had episodes of desaturation earlier as well with fluctuating fio2. Noted labs from this evening LA was ok.  K has not been replaced  eICU Interventions  40 meq IV Kcl to help with A fib  CXR     Intervention Category Major Interventions: Arrhythmia - evaluation and management  Anthonella Klausner G Dorise Gangi 11/08/2023, 8:42 PM  Addendum at 12:20 am - Oliguria. Got fluid bolus earlier as well. Plan to diurese but hemodynamics are tenuous. Hypoglycemia - starting d5. O2 sat is 95 on 100% fio2 so will wean

## 2023-11-08 NOTE — Progress Notes (Signed)
 1 Day Post-Op   Subjective/Chief Complaint: Awake and following commands.   Pressor requirements coming down since surgery. Ostomy appears healthy. Drain SS.  He is AF but tachy.  Did have 1 desat event overnight but is now tolerating O2 wean.    Objective: Vital signs in last 24 hours: Temp:  [98.1 F (36.7 C)-99.4 F (37.4 C)] 98.1 F (36.7 C) (03/01 0800) Pulse Rate:  [77-135] 108 (03/01 0700) Resp:  [16-23] 18 (03/01 0700) BP: (90-124)/(46-81) 110/56 (03/01 0818) SpO2:  [86 %-100 %] 97 % (03/01 0700) Arterial Line BP: (92-135)/(48-82) 116/53 (03/01 0700) FiO2 (%):  [40 %-100 %] 50 % (03/01 0818) Weight:  [161 kg] 128 kg (03/01 0500) Last BM Date : 11/07/23  Intake/Output from previous day: 02/28 0701 - 03/01 0700 In: 4258.1 [I.V.:2345.9; Blood:486; NG/GT:500; IV Piggyback:926.2] Out: 2750 [Urine:1350; Drains:835; Stool:150; Blood:15] Intake/Output this shift: No intake/output data recorded.  Intubated, following commands Abd - midline vac in place with SS output, JP with SS output, ostomy pink and healthy with thin stool in the bag  Lab Results:  Recent Labs    11/07/23 0610 11/07/23 1415 11/08/23 0118 11/08/23 0539  WBC 15.8*  --   --  11.0*  HGB 5.6*   < > 7.1* 7.3*  HCT 18.4*   < > 21.0* 23.2*  PLT 348  --   --  280   < > = values in this interval not displayed.   BMET Recent Labs    11/07/23 1719 11/08/23 0118 11/08/23 0539  NA 144 147* 146*  K 2.9* 3.2* 3.3*  CL 114*  --  116*  CO2 19*  --  19*  GLUCOSE 94  --  93  BUN 51*  --  47*  CREATININE 1.34*  --  1.30*  CALCIUM 7.6*  --  7.8*   PT/INR Recent Labs    11/07/23 0753 11/08/23 0539  LABPROT 15.9* 15.0  INR 1.3* 1.2   ABG Recent Labs    11/05/23 2009 11/08/23 0118  PHART 7.322* 7.342*  HCO3 25.1 19.8*    Studies/Results: No results found.   Anti-infectives: Anti-infectives (From admission, onward)    Start     Dose/Rate Route Frequency Ordered Stop   11/07/23 2200   piperacillin-tazobactam (ZOSYN) IVPB 3.375 g        3.375 g 12.5 mL/hr over 240 Minutes Intravenous Every 8 hours 11/07/23 1407     11/07/23 2000  vancomycin (VANCOREADY) IVPB 1750 mg/350 mL        1,750 mg 175 mL/hr over 120 Minutes Intravenous Every 24 hours 11/07/23 1102     11/06/23 0000  vancomycin (VANCOREADY) IVPB 1250 mg/250 mL  Status:  Discontinued        1,250 mg 166.7 mL/hr over 90 Minutes Intravenous Every 24 hours 11/05/23 2320 11/07/23 1102   10/25/23 1400  piperacillin-tazobactam (ZOSYN) IVPB 3.375 g  Status:  Discontinued        3.375 g 12.5 mL/hr over 240 Minutes Intravenous Every 8 hours 10/25/23 0942 11/07/23 1407   10/25/23 0000  ceFEPIme (MAXIPIME) 2 g in sodium chloride 0.9 % 100 mL IVPB  Status:  Discontinued        2 g 200 mL/hr over 30 Minutes Intravenous Every 12 hours 10/24/23 1853 10/25/23 0942   10/24/23 2300  piperacillin-tazobactam (ZOSYN) IVPB 3.375 g  Status:  Discontinued        3.375 g 12.5 mL/hr over 240 Minutes Intravenous Every 8 hours 10/24/23 1509 10/24/23 1853  10/24/23 2300  metroNIDAZOLE (FLAGYL) IVPB 500 mg  Status:  Discontinued        500 mg 100 mL/hr over 60 Minutes Intravenous Every 12 hours 10/24/23 1838 10/25/23 0942   10/24/23 2000  micafungin (MYCAMINE) 100 mg in sodium chloride 0.9 % 100 mL IVPB  Status:  Discontinued        100 mg 105 mL/hr over 1 Hours Intravenous Every 24 hours 10/24/23 1838 10/29/23 1152   10/24/23 1945  vancomycin (VANCOREADY) IVPB 2000 mg/400 mL        2,000 mg 200 mL/hr over 120 Minutes Intravenous NOW 10/24/23 1854 10/24/23 2239   10/24/23 1854  vancomycin variable dose per unstable renal function (pharmacist dosing)  Status:  Discontinued         Does not apply See admin instructions 10/24/23 1854 10/25/23 0942   10/24/23 1245  ceFEPIme (MAXIPIME) 2 g in sodium chloride 0.9 % 100 mL IVPB        2 g 200 mL/hr over 30 Minutes Intravenous  Once 10/24/23 1236 10/24/23 1334   10/24/23 1245  metroNIDAZOLE  (FLAGYL) IVPB 500 mg        500 mg 100 mL/hr over 60 Minutes Intravenous  Once 10/24/23 1236 10/24/23 1453       Assessment/Plan: -S/p ex lap with subtotal abdominal colectomy for free air/ ischemic colitis (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 Magnus Ivan 2/17 -s/p exploratory laparotomy/ LOA/ resection of additional sigmoid colon stump/ debridement of abdominal wall 11/05/23 - s/p ex lap, washout, drain placement, and abdominal wall closure Dr. Corliss Skains 2/28   - Wean to extubate - Monitor drain output - Continue trickle TF while on pressors - OK to anticoagulate for a fib with short-acting anticoagulation - Continue abx - Remainder of care per CCM   FEN: trickle TF VTE: therapeutic lovenox ID: [email or web address] 2/14. Zosyn/vanc 2/15>>    LOS: 15 days    Moise Boring, MD [facility] Acute Care Surgery 11/08/2023, 8:30 AM Please see Amion for pager number during day hours 7:00am-4:30pm

## 2023-11-09 ENCOUNTER — Inpatient Hospital Stay (HOSPITAL_COMMUNITY)

## 2023-11-09 DIAGNOSIS — A419 Sepsis, unspecified organism: Secondary | ICD-10-CM | POA: Diagnosis not present

## 2023-11-09 DIAGNOSIS — J9601 Acute respiratory failure with hypoxia: Secondary | ICD-10-CM | POA: Diagnosis not present

## 2023-11-09 DIAGNOSIS — R6521 Severe sepsis with septic shock: Secondary | ICD-10-CM | POA: Diagnosis not present

## 2023-11-09 DIAGNOSIS — K659 Peritonitis, unspecified: Secondary | ICD-10-CM | POA: Diagnosis not present

## 2023-11-09 LAB — COMPREHENSIVE METABOLIC PANEL
ALT: 19 U/L (ref 0–44)
AST: 70 U/L — ABNORMAL HIGH (ref 15–41)
Albumin: <1.5 g/dL — ABNORMAL LOW (ref 3.5–5.0)
Alkaline Phosphatase: 59 U/L (ref 38–126)
Anion gap: 12 (ref 5–15)
BUN: 58 mg/dL — ABNORMAL HIGH (ref 6–20)
CO2: 17 mmol/L — ABNORMAL LOW (ref 22–32)
Calcium: 7.5 mg/dL — ABNORMAL LOW (ref 8.9–10.3)
Chloride: 117 mmol/L — ABNORMAL HIGH (ref 98–111)
Creatinine, Ser: 2 mg/dL — ABNORMAL HIGH (ref 0.61–1.24)
GFR, Estimated: 38 mL/min — ABNORMAL LOW (ref >60)
Glucose, Bld: 99 mg/dL (ref 70–99)
Potassium: 3.7 mmol/L (ref 3.5–5.1)
Sodium: 146 mmol/L — ABNORMAL HIGH (ref 135–145)
Total Bilirubin: 1.8 mg/dL — ABNORMAL HIGH (ref 0.0–1.2)
Total Protein: 4.3 g/dL — ABNORMAL LOW (ref 6.5–8.1)

## 2023-11-09 LAB — CBC
HCT: 24.7 % — ABNORMAL LOW (ref 39.0–52.0)
Hemoglobin: 7.6 g/dL — ABNORMAL LOW (ref 13.0–17.0)
MCH: 27.2 pg (ref 26.0–34.0)
MCHC: 30.8 g/dL (ref 30.0–36.0)
MCV: 88.5 fL (ref 80.0–100.0)
Platelets: 242 10*3/uL (ref 150–400)
RBC: 2.79 MIL/uL — ABNORMAL LOW (ref 4.22–5.81)
RDW: 19 % — ABNORMAL HIGH (ref 11.5–15.5)
WBC: 19.5 10*3/uL — ABNORMAL HIGH (ref 4.0–10.5)
nRBC: 1.1 % — ABNORMAL HIGH (ref 0.0–0.2)

## 2023-11-09 LAB — GLUCOSE, CAPILLARY
Glucose-Capillary: 76 mg/dL (ref 70–99)
Glucose-Capillary: 90 mg/dL (ref 70–99)
Glucose-Capillary: 95 mg/dL (ref 70–99)
Glucose-Capillary: 102 mg/dL — ABNORMAL HIGH (ref 70–99)
Glucose-Capillary: 148 mg/dL — ABNORMAL HIGH (ref 70–99)
Glucose-Capillary: 164 mg/dL — ABNORMAL HIGH (ref 70–99)

## 2023-11-09 LAB — CREATININE, SERUM
Creatinine, Ser: 2.46 mg/dL — ABNORMAL HIGH (ref 0.61–1.24)
GFR, Estimated: 30 mL/min — ABNORMAL LOW (ref >60)

## 2023-11-09 LAB — VANCOMYCIN, TROUGH: Vancomycin Tr: 42 ug/mL (ref 15–20)

## 2023-11-09 LAB — PHOSPHORUS: Phosphorus: 5.1 mg/dL — ABNORMAL HIGH (ref 2.5–4.6)

## 2023-11-09 MED ORDER — DEXTROSE 5 % IV SOLN: INTRAVENOUS | Status: AC

## 2023-11-09 MED ORDER — INSULIN ASPART 100 UNIT/ML IJ SOLN
0.0000 [IU] | Freq: Four times a day (QID) | INTRAMUSCULAR | Status: DC
  Administered 2023-11-10 (×2): 2 [IU] via SUBCUTANEOUS
  Administered 2023-11-10 – 2023-11-11 (×3): 1 [IU] via SUBCUTANEOUS

## 2023-11-09 MED ORDER — THIAMINE MONONITRATE 100 MG PO TABS: 100.0000 mg | ORAL_TABLET | Freq: Every day | ORAL | Status: DC

## 2023-11-09 MED ORDER — NOREPINEPHRINE 16 MG/250ML-% IV SOLN
0.0000 ug/min | INTRAVENOUS | Status: DC
  Filled 2023-11-09: qty 250

## 2023-11-09 MED ORDER — THIAMINE HCL 100 MG/ML IJ SOLN
100.0000 mg | Freq: Every day | INTRAMUSCULAR | Status: AC
  Administered 2023-11-09 – 2023-11-11 (×3): 100 mg via INTRAVENOUS
  Filled 2023-11-09 (×3): qty 2

## 2023-11-09 MED ORDER — PHENYLEPHRINE CONCENTRATED 100MG/250ML (0.4 MG/ML) INFUSION SIMPLE
0.0000 ug/min | INTRAVENOUS | Status: DC
  Administered 2023-11-09: 120 ug/min via INTRAVENOUS
  Filled 2023-11-09: qty 250

## 2023-11-09 MED ORDER — TRACE MINERALS CU-MN-SE-ZN 300-55-60-3000 MCG/ML IV SOLN
INTRAVENOUS | Status: AC
  Filled 2023-11-09: qty 480

## 2023-11-09 MED ORDER — VANCOMYCIN VARIABLE DOSE PER UNSTABLE RENAL FUNCTION (PHARMACIST DOSING): Status: DC

## 2023-11-09 MED ORDER — HYDROCORTISONE SOD SUC (PF) 100 MG IJ SOLR
100.0000 mg | Freq: Two times a day (BID) | INTRAMUSCULAR | Status: DC
  Administered 2023-11-09 – 2023-11-10 (×4): 100 mg via INTRAVENOUS
  Filled 2023-11-09 (×4): qty 2

## 2023-11-09 MED ORDER — POTASSIUM CHLORIDE 10 MEQ/50ML IV SOLN
10.0000 meq | INTRAVENOUS | Status: AC
  Administered 2023-11-09 (×3): 10 meq via INTRAVENOUS
  Filled 2023-11-09: qty 50

## 2023-11-09 MED ORDER — SODIUM CHLORIDE 0.9 % IV SOLN
100.0000 mg | INTRAVENOUS | Status: DC
  Administered 2023-11-09 – 2023-11-10 (×2): 100 mg via INTRAVENOUS
  Filled 2023-11-09 (×3): qty 5

## 2023-11-09 MED ORDER — ALBUMIN HUMAN 25 % IV SOLN
12.5000 g | Freq: Once | INTRAVENOUS | Status: AC
  Administered 2023-11-09: 12.5 g via INTRAVENOUS
  Filled 2023-11-09: qty 50

## 2023-11-09 NOTE — Progress Notes (Signed)
 eLink Physician-Brief Progress Note Patient Name: Daniel Butler DOB: September 13, 1965 MRN: [id number]   Date of Service  11/09/2023  HPI/Events of Note  Vanc trough elevated  eICU Interventions  RN calling pharmacist for same.     Intervention Category Minor Interventions: Other:  Ranee Gosselin 11/09/2023, 10:36 PM  00:26 Urinary output only 5ml in the past 2 hours. Allbumin <1.5.  JP drain stable. No bleeding.weaning down neo/vaso.  Camera: HR 76, in synchrony with vent.  Plan: Albumin bolus, get ABG, BMP Last one from noon worsening acidosis.  Discussed with RN.   03/03 01:53 AM Freq Pauses with afib, rate got down to 39, I told RN to turn amio off and collect am labs, current afib with HR 51   111/55  Camera: HR > 50 to 60 now, post amiodarone pause, discussed with RN. ABG pH improving to 7.31. hco3 at 14. Partially compensated. - bicarb 50 meq once ordered - get AM labs now, including cbc. No bleeding, ABG Hg 6.8 Cr > 2.  02:28  Anemia 6.8 . No active bleeding. 1 PRBC ordered stat. Follow Hg post.

## 2023-11-09 NOTE — Progress Notes (Signed)
 Pharmacy Antibiotic Note  Daniel Butler is a 58 y.o. male admitted on 10/24/2023 with  septic shock in the setting of bowel perforation and  E. Faecalis bacteremia. TTE on 2/16 negative for vegetations. In OR, patient found to have diffuse colonic distention with ischemia and gangrenous colonic necrosis with scattered areas of perforation and massive flocculent peritonitis.  Patient went back to OR on 2/26 and was found to have perforating sigmoid stump. Stump appeared necrotic likely from initial process causing colonic ischemia. 2/28 back to OR for ex lap, abdominal closure and wound VAC. Pharmacy has been consulted for vancomycin dosing. Patient is also on piperacillin/tazobactam .   Renal function worsening. Vancomycin level = 42 about 24 hrs post dose.    Plan: Continue to hold vancomycin doses for now.  Repeat random level in a day or two. Zosyn 3.375gm IV every 8 hours  Monitor cultures, clinical status, renal function, vancomycin level Narrow abx as able and f/u duration   Height: 6\' 1"  (185.4 cm) Weight: 131.7 kg (290 lb 5.5 oz) IBW/kg (Calculated) : 79.9  Temp (24hrs), Avg:98.5 F (36.9 C), Min:98.2 F (36.8 C), Max:98.8 F (37.1 C)  Recent Labs  Lab 11/06/23 0009 11/06/23 0518 11/06/23 1710 11/07/23 0610 11/07/23 1719 11/08/23 0539 11/08/23 1719 11/09/23 0553 11/09/23 2142  WBC 12.3* 12.5*  --  15.8*  --  11.0* 15.1* 19.5*  --   CREATININE 1.95* 1.94*  --  1.20 1.34* 1.30* 1.58* 2.00* 2.46*  LATICACIDVEN 2.0*  --  3.2*  3.2*  --   --   --  1.4  --   --   VANCOTROUGH  --   --   --   --   --   --   --   --  42*    Estimated Creatinine Clearance: 46.6 mL/min (A) (by C-G formula based on SCr of 2.46 mg/dL (H)).    Allergies  Allergen Reactions   Zetia [Ezetimibe] Nausea And Vomiting    Antimicrobials this admission: Zosyn 2/15 >>  Vancomycin 2/14, 2/26 >>  Micafungin 2/1/4>2/19 Hydrocort 2/15> Cefepime 2/14> 2/15  Flagyl 2/14>2/15  Microbiology  results: 2/14 BCx: 1/4 E faecalis  2/14 MRSA PCR: positive 2/14 RVP - neg 2/15 BCx - negative  Thank you for allowing pharmacy to be a part of this patient's care.  [name], [name], BCCP Clinical Pharmacist  11/09/2023 10:39 PM   [facility] pharmacy phone numbers are listed on [email or web address]

## 2023-11-09 NOTE — Progress Notes (Signed)
 Nutrition Follow-up  DOCUMENTATION CODES:   Not applicable  INTERVENTION:  TPN to meet nutrition needs. Discussed with Pharmacy    Once able to tolerate EN recommend:  Vital 1.5 @ 20 ml/hr increasing by 10 ml every 8 hours to goal rate of 60 ml/hr 60 ml Prosource TF20 TID Provides: 2400 kcal, 157 grams protein and 1100 ml free water   NUTRITION DIAGNOSIS:   Inadequate oral intake related to inability to eat as evidenced by NPO status.  On going with intervention in place  GOAL:   Patient will meet greater than or equal to 90% of their needs    MONITOR:   Labs, Skin, I & O's  REASON FOR ASSESSMENT:   Consult New TPN/TNA  ASSESSMENT:   58 yo male admitted with bowel perforation, massive feculent peritonitis. S/P ex lap, subtotal abdominal colectomy 2/14. PMH includes HTN, HLD, gout, brain aneurysm, prior alcohol abuse, former smoker.   2/14 -  s/p ex lap with subtotal abd colectomy left in discontinuity  2/15 - s/p re-exploration with creation of end ileostomy and closure  2/17 - s/p OR washout and end ileostomy  2/18 - started TPN 2/20 -  starting trickle TF, TPN advancing to goal this PM  2/21 - advance TF by 10 ml every 12 hours to goal rate of 60 ml/hr; extubated 2/24 - cortrak placed; tip gastric  2/26 - s/p ex lap, LOA > 60  min, partial sigmoid colon resection with anastomosis, excision of necrotic appearing fascia and subcutaneous tissue 2 cm x 10 cm; ~ 4.6 L of stool and pus drained from abd; Process causing colonic ischemia NG tube place,  2/27- Pharmacy consulted for TPN overnight 2/27- 2/28. Per surgery 2/27 note, may restart TF after OR 2/28. Per discussion with CCM 2/28, ok to hold TPN for now given possible TF restart after surgery.  -2/28 for abdominal closure, trickle feeds restarted. Unfortunately, patient vomited tube feeds on 3/1 am with worsening respiratory status and pressor requirements. Pharmacy re-consulted to start TPN on 3/2.   Admit  weight: 106.6 kg Hospital weight history: 11/09/23 0703 131.7 kg 290.35 lbs  11/08/23 0500 128 kg 282.19 lbs  11/07/23 0706 128.5 kg 283.29 lbs  11/06/23 0500 119 kg 262.35 lbs  11/05/23 0500 117.9 kg 259.92 lbs  11/04/23 0500 116.4 kg 256.62 lbs  11/03/23 0500 118.3 kg 260.8 lbs  11/02/23 0500 116.5 kg 256.84 lbs  11/01/23 0500 115 kg 253.53 lbs  10/31/23 0500 113.6 kg 250.44 lbs  10/27/23 0500 118.9 kg 262.13 lbs  10/26/23 0500 118.2 kg 260.58 lbs  10/25/23 0441 119.3 kg 263.01 lbs  10/24/23 1226 106.6 kg 235 lbs      Average Meal Intake: NPO  Nutritionally Relevant Medications: Scheduled Meds:  insulin aspart  0-9 Units Subcutaneous Q6H   thiamine (VITAMIN B1) injection  100 mg Intravenous Daily   vancomycin variable dose per unstable renal function (pharmacist dosing)   Does not apply See admin instructions    Continuous Infusions:   amiodarone 30 mg/hr (11/09/23 1100)   dextrose 40 mL/hr at 11/09/23 1100   norepinephrine     phenylephrine (NEO-SYNEPHRINE) Adult infusion 130 mcg/min (11/09/23 1337)   piperacillin-tazobactam (ZOSYN)  IV 3.375 g (11/09/23 1336)   TPN ADULT (ION)     vasopressin 0.03 Units/min (11/09/23 1100)    Labs Reviewed    NUTRITION - FOCUSED PHYSICAL EXAM:  Flowsheet Row Most Recent Value  Orbital Region Mild depletion  Upper Arm Region No depletion  Thoracic  and Lumbar Region No depletion  Buccal Region Unable to assess  Temple Region Mild depletion  Clavicle Bone Region Mild depletion  Clavicle and Acromion Bone Region No depletion  Scapular Bone Region No depletion  Dorsal Hand Mild depletion  Patellar Region Mild depletion  Anterior Thigh Region Mild depletion  Posterior Calf Region Mild depletion  Edema (RD Assessment) Mild  Hair Reviewed  Eyes Unable to assess  Mouth Unable to assess  Skin Reviewed  Nails Reviewed       Diet Order:   Diet Order             Diet NPO time specified  Diet effective now                    EDUCATION NEEDS:   Not appropriate for education at this time  Skin:  Skin Assessment: Skin Integrity Issues: Skin Integrity Issues:: Wound VAC Wound Vac: Abd  Last BM:  3/2 Ileostomy  Height:   Ht Readings from Last 1 Encounters:  10/29/23 6\' 1"  (1.854 m)    Weight:   Wt Readings from Last 1 Encounters:  11/09/23 131.7 kg    Ideal Body Weight:  83.6 kg  BMI:  Body mass index is 38.31 kg/m.  Estimated Nutritional Needs:   Kcal:  2300-2500  Protein:  135-155 grams  Fluid:  >2.3 L/day    Jamelle Haring RDN, LDN Clinical Dietitian   If unable to reach, please contact "[facility] Inpatient" secure chat group between 8 am-4 pm daily"

## 2023-11-09 NOTE — Progress Notes (Addendum)
 PHARMACY - TOTAL PARENTERAL NUTRITION CONSULT NOTE   Indication: Colonic ischemia   Patient Measurements: Height: 6\' 1"  (185.4 cm) Weight: 131.7 kg (290 lb 5.5 oz) IBW/kg (Calculated) : 79.9 TPN AdjBW (KG): 86.6 Body mass index is 38.31 kg/m. Usual Weight: ~250 lbs   Assessment:  Patient admitted 2/14 with septic shock in the setting of bowel perforation and enterococcus bacteremia. TTE on 2/16 negative for vegetations. In OR, patient found to have diffuse colonic distention with ischemia and gangrenous colonic necrosis with scattered areas of perforation and massive flocculent peritonitis. Due to prolonged ileus postoperatively, patient was on TPN 2/18- 2/22 and weaned off when tolerating tube feeds. On 2/26, patient went back to OR and was found to have perforating sigmoid stump. Stump appeared necrotic likely from initial process causing colonic ischemia.Pharmacy consulted for TPN overnight 2/27- 2/28. Per surgery 2/27 note, may restart TF after OR 2/28. Per discussion with CCM 2/28, ok to hold TPN for now given possible TF restart after surgery.   After OR 2/28 for abdominal closure, trickle feeds restarted. Unfortunately, patient vomited tube feeds on 3/1 am with worsening respiratory status and pressor requirements. Pharmacy re-consulted to start TPN on 3/2. Patient is volume overloaded (weight has increased by ~30 lbs per charting, using bed weights) but also on high pressor requirement with AKI and low UOP. Patient without nutrition since 2/26 pm. Will start concentrated TPN.   Glucose / Insulin: BG <100 off TF, D50 amp x1 given, on D5W@40ml /hr, no insulin used. When on goal TPN previously, was using ~6-8 units SSI/24 hr Electrolytes: Na 146 on D5W, K 3.7 (ordered 30 mEq IV), CL 117, CO2 17, CoCa 9.5, Phos 5.1, last Mg 2.1 on 3/1 Renal: Scr 2 up (BL 1.3), BUN 58 Hepatic: Alk phos/ALT wnl. AST 70, Tbili 1.8, TG 175 on 2/23, albumin <1.5 Intake / Output; MIVF: UOP 0.2 ml/kg/min, NGT  , drains , ileostomy ; admission net + 19L (insensible losses not accounted for)  GI Imaging: 2/14 CT Ab/P - Substantial free intraperitoneal gas with ascites, exact site of perforation not well seen although colonic etiology favored given the diffuse colonic distension 2/26 CT: Hartmann pouch distended and fluid filled, diffuse SB distension, likely postop ileus; large extraluminal gas and fluid suggesting perforated viscus; possible developing fistula  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; ABThera wound VAC application  2/17 exploratory laparotomy, end ileostomy, fascia closure  2/26 lysis of adhesions. Excision of necrotic tissue and partial sigmoid resection.  2/28 exploration, abdominal closure, VAC placement.   Central access: 11/05/23 TPN start date:  2/18- 2/22, 3/2 >>   Nutritional Goals:  Goal concentrated TPN rate is 75 mL/hr (provides 135 g of protein and 2313 kcals per day)  RD Assessment: Estimated Needs Total Energy Estimated Needs: 2300-2500 Total Protein Estimated Needs: 135-155 grams Total Fluid Estimated Needs: >2.3 L/day  Current Nutrition:   NPO + TPN  Plan:   Start concentrated TPN at 40 mL/hr at 1800, provides 72g AA and 1233 kcals, meeting 53% of estimated goals.  Electrolytes in TPN: Na 50 mEq/L, K 30 mEq/L, Ca 3 mEq/L, Mg 3 mEq/L, and Phos 0 mmol/L. Cl:Ac 1:2 (Unable to max acetate with current lytes) Add standard MVI and trace elements to TPN Decrease Sensitive q6h SSI and adjust as needed  Thiamine 100mg  daily x3 days Continue D5W at 47ml/hr until 1am per order Monitor TPN labs daily until stable at goal then on Mon/Thurs  Alphia Moh, PharmD, BCPS, BCCP Clinical Pharmacist  Please check AMION for all [facility] Pharmacy phone numbers After 10:00 PM, call Main Pharmacy [phone]

## 2023-11-09 NOTE — Progress Notes (Signed)
 Notified E-link of low urine output and hypoglycemic episode.

## 2023-11-09 NOTE — Progress Notes (Signed)
 2 Days Post-Op   Subjective/Chief Complaint: Did not tolerate Tfs. Ostomy is functioning and his drain remains SS but he suffered worsening of his clinic status in the past 24 hours - UOP has decreased and he did not tolerate a vent went.  He was intermittently tachy to 120s when on levo and has been transitioned back to Neo.   On exam, patient is lethargic but will follow commands.     Objective: Vital signs in last 24 hours: Temp:  [98.4 F (36.9 C)-99 F (37.2 C)] 98.8 F (37.1 C) (03/02 0800) Pulse Rate:  [82-142] 82 (03/02 0800) Resp:  [18-26] 24 (03/02 0800) BP: (82-125)/(51-73) 116/58 (03/02 0740) SpO2:  [89 %-100 %] 99 % (03/02 0800) Arterial Line BP: (101-137)/(47-63) 113/54 (03/02 0800) FiO2 (%):  [60 %-100 %] 90 % (03/02 0740) Weight:  [131.7 kg] 131.7 kg (03/02 0703) Last BM Date : 11/09/23  Intake/Output from previous day: 03/01 0701 - 03/02 0700 In: 5368.1 [I.V.:2836.9; NG/GT:1040; IV Piggyback:1491.1] Out: 2925 [Urine:575; Emesis/NG output:1800; Drains:450; Stool:100] Intake/Output this shift: Total I/O In: 209.6 [I.V.:162.6; IV Piggyback:47] Out: -   Intubated, following commands Abd - midline vac in place with SS output, JP with SS output, ostomy pink and healthy with thin stool in the bag  Lab Results:  Recent Labs    11/08/23 1719 11/09/23 0553  WBC 15.1* 19.5*  HGB 8.3* 7.6*  HCT 26.4* 24.7*  PLT 303 242   BMET Recent Labs    11/08/23 1719 11/09/23 0553  NA 145 146*  K 3.4* 3.7  CL 117* 117*  CO2 18* 17*  GLUCOSE 98 99  BUN 50* 58*  CREATININE 1.58* 2.00*  CALCIUM 7.6* 7.5*   PT/INR Recent Labs    11/07/23 0753 11/08/23 0539  LABPROT 15.9* 15.0  INR 1.3* 1.2   ABG Recent Labs    11/08/23 0118 11/08/23 1357  PHART 7.342* 7.202*  HCO3 19.8* 20.4    Studies/Results: DG Chest Port 1 View Result Date: 11/08/2023 CLINICAL DATA:  Check endotracheal tube placement EXAM: PORTABLE CHEST 1 VIEW COMPARISON:  Film from earlier in the  same day. FINDINGS: Cardiac shadow remains enlarged. Endotracheal tube is again identified and stable. Left subclavian central line, gastric catheter and feeding catheter are again noted in stable position. Bilateral effusions are noted right greater than left. Basilar atelectasis is noted as well. IMPRESSION: Tubes and lines as described stable from the prior exam. Increasing effusions and basilar atelectasis. Electronically Signed   By: Alcide Clever M.D.   On: 11/08/2023 21:20   DG CHEST PORT 1 VIEW Result Date: 11/08/2023 CLINICAL DATA:  Hypoxemia EXAM: PORTABLE CHEST 1 VIEW COMPARISON:  11/06/2023 FINDINGS: Unchanged AP portable chest radiograph. Support apparatus includes endotracheal tube, enteric feeding tube, and left subclavian vascular catheter in appropriate position. Cardiomegaly. Small layering bilateral pleural effusions. No new airspace opacity. No acute osseous findings. IMPRESSION: 1. Unchanged AP portable chest radiograph. Support apparatus includes endotracheal tube, enteric feeding tube, and left subclavian vascular catheter in appropriate position. 2. Cardiomegaly. Small layering bilateral pleural effusions. No new airspace opacity. Electronically Signed   By: Jearld Lesch M.D.   On: 11/08/2023 10:48     Anti-infectives: Anti-infectives (From admission, onward)    Start     Dose/Rate Route Frequency Ordered Stop   11/09/23 0735  vancomycin variable dose per unstable renal function (pharmacist dosing)         Does not apply See admin instructions 11/09/23 0735     11/07/23 2200  piperacillin-tazobactam (ZOSYN) IVPB 3.375 g        3.375 g 12.5 mL/hr over 240 Minutes Intravenous Every 8 hours 11/07/23 1407     11/07/23 2000  vancomycin (VANCOREADY) IVPB 1750 mg/350 mL  Status:  Discontinued        1,750 mg 175 mL/hr over 120 Minutes Intravenous Every 24 hours 11/07/23 1102 11/09/23 0735   11/06/23 0000  vancomycin (VANCOREADY) IVPB 1250 mg/250 mL  Status:  Discontinued         1,250 mg 166.7 mL/hr over 90 Minutes Intravenous Every 24 hours 11/05/23 2320 11/07/23 1102   10/25/23 1400  piperacillin-tazobactam (ZOSYN) IVPB 3.375 g  Status:  Discontinued        3.375 g 12.5 mL/hr over 240 Minutes Intravenous Every 8 hours 10/25/23 0942 11/07/23 1407   10/25/23 0000  ceFEPIme (MAXIPIME) 2 g in sodium chloride 0.9 % 100 mL IVPB  Status:  Discontinued        2 g 200 mL/hr over 30 Minutes Intravenous Every 12 hours 10/24/23 1853 10/25/23 0942   10/24/23 2300  piperacillin-tazobactam (ZOSYN) IVPB 3.375 g  Status:  Discontinued        3.375 g 12.5 mL/hr over 240 Minutes Intravenous Every 8 hours 10/24/23 1509 10/24/23 1853   10/24/23 2300  metroNIDAZOLE (FLAGYL) IVPB 500 mg  Status:  Discontinued        500 mg 100 mL/hr over 60 Minutes Intravenous Every 12 hours 10/24/23 1838 10/25/23 0942   10/24/23 2000  micafungin (MYCAMINE) 100 mg in sodium chloride 0.9 % 100 mL IVPB  Status:  Discontinued        100 mg 105 mL/hr over 1 Hours Intravenous Every 24 hours 10/24/23 1838 10/29/23 1152   10/24/23 1945  vancomycin (VANCOREADY) IVPB 2000 mg/400 mL        2,000 mg 200 mL/hr over 120 Minutes Intravenous NOW 10/24/23 1854 10/24/23 2239   10/24/23 1854  vancomycin variable dose per unstable renal function (pharmacist dosing)  Status:  Discontinued         Does not apply See admin instructions 10/24/23 1854 10/25/23 0942   10/24/23 1245  ceFEPIme (MAXIPIME) 2 g in sodium chloride 0.9 % 100 mL IVPB        2 g 200 mL/hr over 30 Minutes Intravenous  Once 10/24/23 1236 10/24/23 1334   10/24/23 1245  metroNIDAZOLE (FLAGYL) IVPB 500 mg        500 mg 100 mL/hr over 60 Minutes Intravenous  Once 10/24/23 1236 10/24/23 1453       Assessment/Plan: -S/p ex lap with subtotal abdominal colectomy for free air/ ischemic colitis (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 Magnus Ivan 2/17 -s/p exploratory laparotomy/ LOA/  resection of additional sigmoid colon stump/ debridement of abdominal wall 11/05/23 - s/p ex lap, washout, drain placement, and abdominal wall closure Dr. Corliss Skains 2/28   - He is critically ill with worsening MOSF.  Per discussion with the wife, he would not desire any additional surgeries or dialysis  - No plans for additional surgery at this time.  - His current issues are critical care based. We will defer to CCM but will continue to follow for his surgical needs.  - Continue to hold TF - Continue abx - Remainder of care per CCM   FEN: TPN VTE: therapeutic lovenox ID: cefepime/flagyl 2/14. Zosyn/vanc 2/15>>    LOS: 16 days    Moise Boring, MD [facility] Surgery  11/09/2023, 8:35 AM Please see Amion for pager number during day hours 7:00am-4:30pm

## 2023-11-09 NOTE — Progress Notes (Addendum)
 Pharmacy Antibiotic Note  Daniel Butler is a 58 y.o. male admitted on 10/24/2023 with  septic shock in the setting of bowel perforation and  E. Faecalis bacteremia. TTE on 2/16 negative for vegetations. In OR, patient found to have diffuse colonic distention with ischemia and gangrenous colonic necrosis with scattered areas of perforation and massive flocculent peritonitis.  Patient went back to OR on 2/26 and was found to have perforating sigmoid stump. Stump appeared necrotic likely from initial process causing colonic ischemia. 2/28 back to OR for ex lap, abdominal closure and wound VAC. Pharmacy has been consulted for vancomycin dosing. Patient is also on piperacillin/tazobactam .   Renal function worsening. Will hold vancomycin and dose per level.   Plan: Hold vancomycin, dose per 24hr level Zosyn 3.375gm IV every 8 hours  Monitor cultures, clinical status, renal function, vancomycin level Narrow abx as able and f/u duration   Height: 6\' 1"  (185.4 cm) Weight: 131.7 kg (290 lb 5.5 oz) IBW/kg (Calculated) : 79.9  Temp (24hrs), Avg:98.6 F (37 C), Min:98.1 F (36.7 C), Max:99 F (37.2 C)  Recent Labs  Lab 11/06/23 0009 11/06/23 0518 11/06/23 1710 11/07/23 0610 11/07/23 1719 11/08/23 0539 11/08/23 1719 11/09/23 0553  WBC 12.3* 12.5*  --  15.8*  --  11.0* 15.1* 19.5*  CREATININE 1.95* 1.94*  --  1.20 1.34* 1.30* 1.58* 2.00*  LATICACIDVEN 2.0*  --  3.2*  3.2*  --   --   --  1.4  --     Estimated Creatinine Clearance: 57.3 mL/min (A) (by C-G formula based on SCr of 2 mg/dL (H)).    Allergies  Allergen Reactions   Zetia [Ezetimibe] Nausea And Vomiting    Antimicrobials this admission: Zosyn 2/15 >>  Vancomycin 2/14, 2/26 >>  Micafungin 2/1/4>2/19 Hydrocort 2/15> Cefepime 2/14> 2/15  Flagyl 2/14>2/15  Microbiology results: 2/14 BCx: 1/4 E faecalis  2/14 MRSA PCR: positive 2/14 RVP - neg 2/15 BCx - negative  Thank you for allowing pharmacy to be a part of this  patient's care.  Alphia Moh, PharmD, BCPS, BCCP Clinical Pharmacist  Please check AMION for all [facility] Pharmacy phone numbers After 10:00 PM, call Main Pharmacy [phone]

## 2023-11-09 NOTE — Evaluation (Signed)
 SLP Cancellation Note  Patient Details Name: Daniel Butler MRN: [id number] DOB: [birth year]   Cancelled treatment:       Reason Eval/Treat Not Completed: Other (comment);Medical issues which prohibited therapy (remains on vent)  Rolena Infante, MS [facility] SLP Acute Rehab Services Office [phone]  Chales Abrahams 11/09/2023, 6:33 AM

## 2023-11-09 NOTE — Progress Notes (Signed)
 NAME:  Daniel Butler, MRN:  [id number], DOB:  04/19/1966, LOS: 16 ADMISSION DATE:  10/24/2023, CONSULTATION DATE:  10/24/2023 REFERRING MD:  Dr. Bedelia Person - CCS, CHIEF COMPLAINT:  Bowel perf   History of Present Illness:  Daniel Butler is a 58 y.o. 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  States he has felt unwell x 3 days.  On ED arrival patient was seen febrile with temperature 103.4, tachypneic, and tachycardic.  Lab work significant for K2.4, glucose 116, creatinine 3.12, anion gap 26, alkaline phosphate 33, albumin 2.5, lactic 10.3, WBC 19.6.  CT abdomen and pelvis obtained which revealed substantial free air concerning for acute bowel perforation.  Surgery consulted and patient underwent exploratory laparotomy which revealed diffuse colonic distention with ischemia and gangrenous colonic necrosis with scattered areas of perforation and massive flocculent peritonitis.  Postprocedure patient remained intubated and sedated, PCCM consulted for further management admission.  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 OR for subtotal colectomy, intestinal discontinuity  2/15: con't on pressor support, ID consult  2/17: exploratory laparotomy, end ileostomy 2/18: goal to wake up and wean, will need TPN  2/19: waking, gets tachypneic, tachycardic with SBT 2/20: failed SBT. Starting trickle feeds, lasix BID, PO oxy and klonopin  2/21: extubated. 2/26 fluid bolus and free water per tube added 2/26 reintubated for respiratory distress secondary to possible aspiration during abdominal CT scan.  Brought back to operating room for perforated sigmoid stump.  Underwent lysis of adhesions.  Excision of necrotic tissue and partial sigmoid resection. 2/28 Went back to the OR for fascial closure. S/p 2 units PRBC.  No evidence of active bleed. Treatment dose Lovenox held 3/1 rapid A-fib, Levophed changed to phenylephrine.  Amio drip and bolus.  Tube feeds held due to emesis, concern for aspiration, restarted on treatment dose Lovenox  Interim History / Subjective:   Remains in atrial fibrillation, Neo-Synephrine pressor requirements are up to 100% FiO2 Tube feeds remain on hold  Objective   Blood pressure (!) 116/58, pulse 82, temperature 98.8 F (37.1 C), temperature source Axillary, resp. rate (!) 24, height 6\' 1"  (1.854 m), weight 131.7 kg, SpO2 99%. CVP:  [7 mmHg-8 mmHg] 7 mmHg  SpO2: 99 % O2 Flow Rate (L/min): 15 L/min FiO2 (%): (S) 90 %   Intake/Output Summary (Last 24 hours) at 11/09/2023 0825 Last data filed at 11/09/2023 0800 Gross per 24 hour  Intake 5503.74 ml  Output 2925 ml  Net 2578.74 ml   Filed Weights   11/07/23  1610 11/08/23 0500 11/09/23 0703  Weight: 128.5 kg 128 kg 131.7 kg    Physical Exam:  Blood pressure (!) 116/58, pulse 82, temperature 98.8 F (37.1 C), temperature source Axillary, resp. rate (!) 24, height 6\' 1"  (1.854 m), weight 131.7 kg, SpO2 99%. Gen:   Chronically ill-appearing HEENT:  EOMI, sclera anicteric Neck:     No masses; no thyromegaly ET tube Lungs:    Clear to auscultation bilaterally; normal respiratory effort CV:         Regular rate and rhythm; no murmurs Abd:   Wound VAC, ostomy Ext: Anasarca; adequate peripheral perfusion Skin:      Warm and dry; no rash Neuro: Sedated, winces to stimuli  Lab/imaging reviewed Significant for sodium 146, BUN/creatinine 58/2.00 AST 70, ALT 19 total bilirubin 1.9 WBC count increasing to 19.5, hemoglobin 7.6, platelets 242 Chest x-ray with stable bibasilar infiltrates  Ancillary tests personally reviewed    Assessment & Plan:   Gangrenous Colitis  with enterococcus fecalis bacteremia. Status post initial colonic resection with ileostomy Now status post sigmoid resection and abscess drainage - Management per surgery - Monitor wound vac output.  -Pain control  Septic shock secondary peritonitis, e. Faecalis bacteremia.  -Zosyn, vancomycin.  Will add fungal coverage given increasing WBC count, concern for new infections he is on TPN, Recheck cultures -Phenylephrine to norepinephrine, wean down as tolerated.  Continue vasopressin -Stress dose steroids  Acute hypoxic respiratory failure secondary to above and possible aspiration. Continue ventilator, wean down FiO2 as tolerated No plans for SBT today given oxygen requirements Chest x-ray reviewed - VAP bundle  Atrial fibrillation: (eliquis). Now difficult to control likely due to sepsis and critical illness.  Acute on chronic HFpEF. (LVEF 50-55%) - Amiodarone IV, Lovenox for anticoagulation - 14L positive for the admission likely not totally accurate considering multiple abdominal surgeries.   Acute kidney injury Hypokalemia -Monitor  urine output and creatinine -May be headed towards dialysis  Anemia: Hgb 12 at presentation. Over the course of the admission has been in the 7-9 range. 5.6 this morning. Suspect this acute drop is in the post-operative setting + lovenox. S/p 2 units PRBC on 2/28.  No evidence of active bleed.  Monitor labs  Severe protein calorie malnutrition present on admission -Able to tolerate tube feeds Will resume TPN  Severe deconditioning - Will need aggressive rehab when able  Goals of care Discussed with wife at bedside on 3/2 I am concerned about his worsening multiorgan failure and possible need for dialysis His wife tells me that he has been through a lot recently with multiple admissions, surgeries.  She does not think dialysis is a good idea for him and has agreed to no CPR or defibrillations.  We talked about changing focus to more comfort and possible withdrawal of care and she wants to think more about it.  Best Practice (right click and "Reselect all SmartList Selections" daily)   Diet/type: TPN DVT prophylaxis DVT prophylaxis.  Treatment dose Lovenox Pressure ulcer(s): N/A GI prophylaxis: PPI Lines: Left subclavian, art line-both still necessary Foley: Foley catheter still necessary Code Status:  limited Last date of multidisciplinary goals of care discussion: updated wife at bedside 3/2  The patient is critically ill with multiple organ system failure and requires high complexity decision making for assessment and support, frequent evaluation and titration of therapies, advanced monitoring, review of radiographic studies and interpretation of complex data.   Critical Care Time devoted to patient care services, exclusive of separately billable procedures, described  in this note is 35 minutes.   Chilton Greathouse MD [facility] See Amion for pager  If no response to pager , please call [phone] until 7pm After 7:00 pm call Elink  [phone] 11/09/2023,  8:25 AM

## 2023-11-10 ENCOUNTER — Encounter (HOSPITAL_COMMUNITY): Payer: Self-pay | Admitting: Surgery

## 2023-11-10 ENCOUNTER — Inpatient Hospital Stay (HOSPITAL_COMMUNITY)

## 2023-11-10 DIAGNOSIS — K55069 Acute infarction of intestine, part and extent unspecified: Secondary | ICD-10-CM | POA: Diagnosis not present

## 2023-11-10 DIAGNOSIS — I4891 Unspecified atrial fibrillation: Secondary | ICD-10-CM | POA: Diagnosis not present

## 2023-11-10 DIAGNOSIS — A4181 Sepsis due to Enterococcus: Secondary | ICD-10-CM | POA: Diagnosis not present

## 2023-11-10 DIAGNOSIS — J9601 Acute respiratory failure with hypoxia: Secondary | ICD-10-CM | POA: Diagnosis not present

## 2023-11-10 LAB — GLUCOSE, CAPILLARY
Glucose-Capillary: 113 mg/dL — ABNORMAL HIGH (ref 70–99)
Glucose-Capillary: 118 mg/dL — ABNORMAL HIGH (ref 70–99)
Glucose-Capillary: 121 mg/dL — ABNORMAL HIGH (ref 70–99)
Glucose-Capillary: 130 mg/dL — ABNORMAL HIGH (ref 70–99)
Glucose-Capillary: 139 mg/dL — ABNORMAL HIGH (ref 70–99)
Glucose-Capillary: 140 mg/dL — ABNORMAL HIGH (ref 70–99)
Glucose-Capillary: 155 mg/dL — ABNORMAL HIGH (ref 70–99)

## 2023-11-10 LAB — POCT I-STAT 7, (LYTES, BLD GAS, ICA,H+H)
Acid-base deficit: 5 mmol/L — ABNORMAL HIGH (ref 0.0–2.0)
Acid-base deficit: 10 mmol/L — ABNORMAL HIGH (ref 0.0–2.0)
Bicarbonate: 20.5 mmol/L (ref 20.0–28.0)
Bicarbonate: 14.9 mmol/L — ABNORMAL LOW (ref 20.0–28.0)
Calcium, Ion: 1.15 mmol/L (ref 1.15–1.40)
Calcium, Ion: 1.15 mmol/L (ref 1.15–1.40)
HCT: 21 % — ABNORMAL LOW (ref 39.0–52.0)
HCT: 20 % — ABNORMAL LOW (ref 39.0–52.0)
Hemoglobin: 7.1 g/dL — ABNORMAL LOW (ref 13.0–17.0)
Hemoglobin: 6.8 g/dL — CL (ref 13.0–17.0)
O2 Saturation: 97 %
O2 Saturation: 91 %
Patient temperature: 98.5
Potassium: 3.5 mmol/L (ref 3.5–5.1)
Potassium: 3.5 mmol/L (ref 3.5–5.1)
Sodium: 148 mmol/L — ABNORMAL HIGH (ref 135–145)
Sodium: 144 mmol/L (ref 135–145)
TCO2: 22 mmol/L (ref 22–32)
TCO2: 16 mmol/L — ABNORMAL LOW (ref 22–32)
pCO2 arterial: 40.8 mmHg (ref 32–48)
pCO2 arterial: 29.2 mmHg — ABNORMAL LOW (ref 32–48)
pH, Arterial: 7.308 — ABNORMAL LOW (ref 7.35–7.45)
pH, Arterial: 7.316 — ABNORMAL LOW (ref 7.35–7.45)
pO2, Arterial: 103 mmHg (ref 83–108)
pO2, Arterial: 64 mmHg — ABNORMAL LOW (ref 83–108)

## 2023-11-10 LAB — COMPREHENSIVE METABOLIC PANEL
ALT: 15 U/L (ref 0–44)
AST: 52 U/L — ABNORMAL HIGH (ref 15–41)
Albumin: <1.5 g/dL — ABNORMAL LOW (ref 3.5–5.0)
Alkaline Phosphatase: 55 U/L (ref 38–126)
Anion gap: 16 — ABNORMAL HIGH (ref 5–15)
BUN: 70 mg/dL — ABNORMAL HIGH (ref 6–20)
CO2: 15 mmol/L — ABNORMAL LOW (ref 22–32)
Calcium: 7.5 mg/dL — ABNORMAL LOW (ref 8.9–10.3)
Chloride: 112 mmol/L — ABNORMAL HIGH (ref 98–111)
Creatinine, Ser: 2.57 mg/dL — ABNORMAL HIGH (ref 0.61–1.24)
GFR, Estimated: 28 mL/min — ABNORMAL LOW (ref >60)
Glucose, Bld: 177 mg/dL — ABNORMAL HIGH (ref 70–99)
Potassium: 3.5 mmol/L (ref 3.5–5.1)
Sodium: 143 mmol/L (ref 135–145)
Total Bilirubin: 1.9 mg/dL — ABNORMAL HIGH (ref 0.0–1.2)
Total Protein: 4.5 g/dL — ABNORMAL LOW (ref 6.5–8.1)

## 2023-11-10 LAB — CBC
HCT: 21.4 % — ABNORMAL LOW (ref 39.0–52.0)
Hemoglobin: 6.8 g/dL — CL (ref 13.0–17.0)
MCH: 27.8 pg (ref 26.0–34.0)
MCHC: 31.8 g/dL (ref 30.0–36.0)
MCV: 87.3 fL (ref 80.0–100.0)
Platelets: 174 10*3/uL (ref 150–400)
RBC: 2.45 MIL/uL — ABNORMAL LOW (ref 4.22–5.81)
RDW: 19.4 % — ABNORMAL HIGH (ref 11.5–15.5)
WBC: 18.6 10*3/uL — ABNORMAL HIGH (ref 4.0–10.5)
nRBC: 0.7 % — ABNORMAL HIGH (ref 0.0–0.2)

## 2023-11-10 LAB — SURGICAL PATHOLOGY

## 2023-11-10 LAB — MAGNESIUM: Magnesium: 2.1 mg/dL (ref 1.7–2.4)

## 2023-11-10 LAB — HEPARIN LEVEL (UNFRACTIONATED): Heparin Unfractionated: >1.1 [IU]/mL — ABNORMAL HIGH (ref 0.30–0.70)

## 2023-11-10 LAB — PHOSPHORUS: Phosphorus: 4.6 mg/dL (ref 2.5–4.6)

## 2023-11-10 LAB — PREPARE RBC (CROSSMATCH)

## 2023-11-10 LAB — TRIGLYCERIDES: Triglycerides: 150 mg/dL — ABNORMAL HIGH (ref <150)

## 2023-11-10 LAB — HEMOGLOBIN AND HEMATOCRIT, BLOOD
HCT: 24.4 % — ABNORMAL LOW (ref 39.0–52.0)
Hemoglobin: 8.1 g/dL — ABNORMAL LOW (ref 13.0–17.0)

## 2023-11-10 MED ORDER — LORAZEPAM 2 MG/ML IJ SOLN
0.5000 mg | Freq: Three times a day (TID) | INTRAMUSCULAR | Status: DC | PRN
  Administered 2023-11-10 – 2023-11-11 (×2): 0.5 mg via INTRAVENOUS
  Filled 2023-11-10 (×2): qty 1

## 2023-11-10 MED ORDER — CLONAZEPAM 0.5 MG PO TABS: 0.5000 mg | ORAL_TABLET | Freq: Two times a day (BID) | ORAL | Status: DC | PRN

## 2023-11-10 MED ORDER — SODIUM CHLORIDE 0.9 % IV SOLN
100.0000 mg | INTRAVENOUS | Status: DC
  Filled 2023-11-10: qty 5

## 2023-11-10 MED ORDER — SODIUM BICARBONATE 8.4 % IV SOLN
50.0000 meq | Freq: Once | INTRAVENOUS | Status: AC
  Administered 2023-11-10: 50 meq via INTRAVENOUS
  Filled 2023-11-10: qty 50

## 2023-11-10 MED ORDER — HEPARIN (PORCINE) 25000 UT/250ML-% IV SOLN: 1400.0000 [IU]/h | INTRAVENOUS | Status: DC

## 2023-11-10 MED ORDER — HEPARIN (PORCINE) 25000 UT/250ML-% IV SOLN
1400.0000 [IU]/h | INTRAVENOUS | Status: DC
  Administered 2023-11-10: 1400 [IU]/h via INTRAVENOUS
  Filled 2023-11-10: qty 250

## 2023-11-10 MED ORDER — SODIUM CHLORIDE 0.9% IV SOLUTION: Freq: Once | INTRAVENOUS | Status: DC

## 2023-11-10 MED ORDER — ALBUMIN HUMAN 25 % IV SOLN
12.5000 g | Freq: Once | INTRAVENOUS | Status: AC
  Administered 2023-11-10: 12.5 g via INTRAVENOUS
  Filled 2023-11-10: qty 50

## 2023-11-10 MED ORDER — TRACE MINERALS CU-MN-SE-ZN 300-55-60-3000 MCG/ML IV SOLN
INTRAVENOUS | Status: AC
  Filled 2023-11-10: qty 900

## 2023-11-10 MED ORDER — FUROSEMIDE 10 MG/ML IJ SOLN
40.0000 mg | Freq: Once | INTRAMUSCULAR | Status: AC
  Administered 2023-11-10: 40 mg via INTRAVENOUS
  Filled 2023-11-10: qty 4

## 2023-11-10 NOTE — Progress Notes (Signed)
 3 Days Post-Op   Subjective/Chief Complaint: Follows commands on vent, on ps now, wife at bedside   Objective: Vital signs in last 24 hours: Temp:  [97.8 F (36.6 C)-98.6 F (37 C)] 97.8 F (36.6 C) (03/03 0800) Pulse Rate:  [60-87] 73 (03/03 0800) Resp:  [18-26] 24 (03/03 0800) BP: (94-125)/(54-74) 115/71 (03/02 2200) SpO2:  [93 %-100 %] 93 % (03/03 0800) Arterial Line BP: (99-143)/(47-76) 125/63 (03/03 0800) FiO2 (%):  [40 %-80 %] 40 % (03/03 0800) Last BM Date : 11/10/23 (smear on the bedpad)  Intake/Output from previous day: 03/02 0701 - 03/03 0700 In: 2847.3 [I.V.:2133.6; Blood:309.2; IV Piggyback:404.5] Out: 1609 [Urine:99; Emesis/NG output:1050; Drains:235; Stool:225] Intake/Output this shift: Total I/O In: -  Out: 187 [Urine:12; Emesis/NG output:150; Drains:25]  Ab soft vac in place, stoma functional, drain serosang  Lab Results:  Recent Labs    11/09/23 0553 11/10/23 0106 11/10/23 0149 11/10/23 0826  WBC 19.5*  --  18.6*  --   HGB 7.6*   < > 6.8* 8.1*  HCT 24.7*   < > 21.4* 24.4*  PLT 242  --  174  --    < > = values in this interval not displayed.   BMET Recent Labs    11/09/23 0553 11/09/23 2142 11/10/23 0106 11/10/23 0149  NA 146*  --  144 143  K 3.7  --  3.5 3.5  CL 117*  --   --  112*  CO2 17*  --   --  15*  GLUCOSE 99  --   --  177*  BUN 58*  --   --  70*  CREATININE 2.00* 2.46*  --  2.57*  CALCIUM 7.5*  --   --  7.5*   PT/INR Recent Labs    11/08/23 0539  LABPROT 15.0  INR 1.2   ABG Recent Labs    11/08/23 1357 11/10/23 0106  PHART 7.202* 7.316*  HCO3 20.4 14.9*    Studies/Results: DG CHEST PORT 1 VIEW Result Date: 11/09/2023 CLINICAL DATA:  Acute respiratory failure. EXAM: PORTABLE CHEST 1 VIEW COMPARISON:  Radiographs 11/08/2023 and 11/06/2023. Chest CT 08/31/2023. FINDINGS: 0842 hours. Tip of the endotracheal tube is difficult to differentiate from other overlying leads and tubes, but is grossly unchanged at the  midtrachea level. Enteric and feeding tubes project below the diaphragm. Left subclavian central venous catheter projects to the lower SVC level. The heart size and mediastinal contours are stable. Unchanged bibasilar airspace opacities and bilateral pleural effusions. No pneumothorax. The bones appear unchanged. Numerous telemetry leads overlie the chest. IMPRESSION: 1. Unchanged bibasilar airspace opacities and bilateral pleural effusions. 2. Support system as described. Electronically Signed   By: Carey Bullocks M.D.   On: 11/09/2023 12:38   DG Abd 1 View Result Date: 11/09/2023 CLINICAL DATA:  History of abdominal surgery. There is a nasogastric tube and feeding tube. EXAM: ABDOMEN - 1 VIEW COMPARISON:  11/05/2023 FINDINGS: There is a feeding tube and nasogastric tube within the left upper quadrant of the abdomen with tips in the expected location of the gastric lumen. Drainage catheter overlies the left lower quadrant of the abdomen. Several dilated loops of small bowel measuring up to 4 cm noted in the left lower quadrant of the abdomen. Decreased aeration of both lung bases again noted. IMPRESSION: 1. Feeding tube and nasogastric tube within the left upper quadrant of the abdomen with tips in the expected location of the gastric lumen. 2. Several dilated loops of small bowel in the left  lower quadrant of the abdomen. Electronically Signed   By: Signa Kell M.D.   On: 11/09/2023 09:19   DG Chest Port 1 View Result Date: 11/08/2023 CLINICAL DATA:  Check endotracheal tube placement EXAM: PORTABLE CHEST 1 VIEW COMPARISON:  Film from earlier in the same day. FINDINGS: Cardiac shadow remains enlarged. Endotracheal tube is again identified and stable. Left subclavian central line, gastric catheter and feeding catheter are again noted in stable position. Bilateral effusions are noted right greater than left. Basilar atelectasis is noted as well. IMPRESSION: Tubes and lines as described stable from the prior  exam. Increasing effusions and basilar atelectasis. Electronically Signed   By: Alcide Clever M.D.   On: 11/08/2023 21:20    Anti-infectives: Anti-infectives (From admission, onward)    Start     Dose/Rate Route Frequency Ordered Stop   11/09/23 1115  micafungin (MYCAMINE) 100 mg in sodium chloride 0.9 % 100 mL IVPB        100 mg 105 mL/hr over 1 Hours Intravenous Every 24 hours 11/09/23 1019     11/09/23 0735  vancomycin variable dose per unstable renal function (pharmacist dosing)         Does not apply See admin instructions 11/09/23 0735     11/07/23 2200  piperacillin-tazobactam (ZOSYN) IVPB 3.375 g        3.375 g 12.5 mL/hr over 240 Minutes Intravenous Every 8 hours 11/07/23 1407     11/07/23 2000  vancomycin (VANCOREADY) IVPB 1750 mg/350 mL  Status:  Discontinued        1,750 mg 175 mL/hr over 120 Minutes Intravenous Every 24 hours 11/07/23 1102 11/09/23 0735   11/06/23 0000  vancomycin (VANCOREADY) IVPB 1250 mg/250 mL  Status:  Discontinued        1,250 mg 166.7 mL/hr over 90 Minutes Intravenous Every 24 hours 11/05/23 2320 11/07/23 1102   10/25/23 1400  piperacillin-tazobactam (ZOSYN) IVPB 3.375 g  Status:  Discontinued        3.375 g 12.5 mL/hr over 240 Minutes Intravenous Every 8 hours 10/25/23 0942 11/07/23 1407   10/25/23 0000  ceFEPIme (MAXIPIME) 2 g in sodium chloride 0.9 % 100 mL IVPB  Status:  Discontinued        2 g 200 mL/hr over 30 Minutes Intravenous Every 12 hours 10/24/23 1853 10/25/23 0942   10/24/23 2300  piperacillin-tazobactam (ZOSYN) IVPB 3.375 g  Status:  Discontinued        3.375 g 12.5 mL/hr over 240 Minutes Intravenous Every 8 hours 10/24/23 1509 10/24/23 1853   10/24/23 2300  metroNIDAZOLE (FLAGYL) IVPB 500 mg  Status:  Discontinued        500 mg 100 mL/hr over 60 Minutes Intravenous Every 12 hours 10/24/23 1838 10/25/23 0942   10/24/23 2000  micafungin (MYCAMINE) 100 mg in sodium chloride 0.9 % 100 mL IVPB  Status:  Discontinued        100 mg 105  mL/hr over 1 Hours Intravenous Every 24 hours 10/24/23 1838 10/29/23 1152   10/24/23 1945  vancomycin (VANCOREADY) IVPB 2000 mg/400 mL        2,000 mg 200 mL/hr over 120 Minutes Intravenous NOW 10/24/23 1854 10/24/23 2239   10/24/23 1854  vancomycin variable dose per unstable renal function (pharmacist dosing)  Status:  Discontinued         Does not apply See admin instructions 10/24/23 1854 10/25/23 0942   10/24/23 1245  ceFEPIme (MAXIPIME) 2 g in sodium chloride 0.9 % 100 mL IVPB  2 g 200 mL/hr over 30 Minutes Intravenous  Once 10/24/23 1236 10/24/23 1334   10/24/23 1245  metroNIDAZOLE (FLAGYL) IVPB 500 mg        500 mg 100 mL/hr over 60 Minutes Intravenous  Once 10/24/23 1236 10/24/23 1453       Assessment/Plan: S/p ex lap with subtotal abdominal colectomy for free air/ ischemic colitis (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 Magnus Ivan 2/17 -s/p exploratory laparotomy/ LOA/ resection of additional sigmoid colon stump/ debridement of abdominal wall 11/05/23 - s/p ex lap, washout, drain placement, and abdominal wall closure Dr. Corliss Skains 2/28   - vac changes as scheduled -if not extubating can try to trickle tube feeds again with bowel function -discussed with wife   FEN: TPN VTE: therapeutic lovenox ID: [email or web address] 2/14. Zosyn/vanc 2/15>>   Emelia Loron 11/10/2023

## 2023-11-10 NOTE — Progress Notes (Signed)
 NAME:  Daniel Butler, MRN:  [id number], DOB:  [birth year], LOS: 17 ADMISSION DATE:  10/24/2023, CONSULTATION DATE:  10/24/2023 REFERRING MD:  Dr. Bedelia Person - [facility], CHIEF COMPLAINT:  Bowel perf   History of Present Illness:  Daniel Butler is a 58 y.o. with a past medical history significant for orthostatic hypotension, chronic diastolic congestive heart failure, persistent atrial fibrillation on Eliquis, CKD stage IIIa, anemia of chronic disease, severe alcohol use disorder, chronic ulcer of the left foot, prior SAH secondary to aneurysm s/p clipping, alcohol use, and depression who presented to the ED 2/14 from [facility] and rehab with complaints of abdominal pain and swelling with associated nausea and small-volume emesis that began day prior to admission.  States he has felt unwell x 3 days.  On ED arrival patient was seen febrile with temperature 103.4, tachypneic, and tachycardic.  Lab work significant for K2.4, glucose 116, creatinine 3.12, anion gap 26, alkaline phosphate 33, albumin 2.5, lactic 10.3, WBC 19.6.  CT abdomen and pelvis obtained which revealed substantial free air concerning for acute bowel perforation.  Surgery consulted and patient underwent exploratory laparotomy which revealed diffuse colonic distention with ischemia and gangrenous colonic necrosis with scattered areas of perforation and massive flocculent peritonitis.  Postprocedure patient remained intubated and sedated, PCCM consulted for further management admission.  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 OR for subtotal colectomy, intestinal discontinuity  2/15: con't on pressor support, ID consult  2/17: exploratory laparotomy, end ileostomy 2/18: goal to wake up and wean, will need TPN  2/19: waking, gets tachypneic, tachycardic with SBT 2/20: failed SBT. Starting trickle feeds, lasix BID, PO oxy and klonopin  2/21: extubated. 2/26 fluid bolus and free water per tube added 2/26 reintubated for respiratory distress secondary to possible aspiration during abdominal CT scan.  Brought back to operating room for perforated sigmoid stump.  Underwent lysis of adhesions.  Excision of necrotic tissue and partial sigmoid resection. 2/28 Went back to the OR for fascial closure. S/p 2 units PRBC.  No evidence of active bleed. Treatment dose Lovenox held 3/1 rapid A-fib, Levophed changed to phenylephrine.  Amio drip and bolus.  Tube feeds held due to emesis, concern for aspiration, restarted on treatment dose Lovenox  Interim History / Subjective:   Overnight had episodes of pauses/bradycardia. Amio was turned off. Bicarb amp given. Transfused PRBC x 1 unit overnight for Hg 6.8  Tolerating PS this am  On neo and vasopressin  Objective   Blood pressure 115/71, pulse 80, temperature 97.8 F (36.6 C), temperature source Axillary, resp. rate 16, height 6\' 1"  (1.854 m), weight 131.7 kg, SpO2 90%. CVP:  [8 mmHg-10 mmHg] 10 mmHg  SpO2: 90 % O2 Flow Rate (L/min): 15 L/min FiO2 (%): 40 %   Intake/Output Summary (Last 24 hours) at 11/10/2023 1035 Last data filed at 11/10/2023 1000 Gross per 24 hour  Intake 3047.84 ml  Output 1771 ml  Net 1276.84 ml   Filed Weights   11/07/23 0706 11/08/23 0500  11/09/23 0703  Weight: 128.5 kg 128 kg 131.7 kg   Physical Exam: General: Chronically ill-appearing, no acute distress HENT: , AT, ETT in place Eyes: EOMI, no scleral icterus Respiratory: Clear to auscultation bilaterally.  No crackles, wheezing or rales Cardiovascular: RRR, -M/R/G, no JVD GI: Wound vac in place, JP drain in place, BS+, soft, nontender, Extremities:3+ pitting edema in lower extremities,-tenderness Neuro: Awake alert, nodding and following commands GU: Foley in place  Imaging, labs and test in EMR in the last 24 hours reviewed independently by me. Pertinent findings below: CO2 15, slightly worsening BUN/Cr 70/2.57 worsening AG 16 LFTs improving/stable WBC 18.6 elevated  Ancillary tests personally reviewed    Assessment & Plan:   Gangrenous Colitis  with enterococcus fecalis bacteremia. Status post initial colonic resection with ileostomy Now status post sigmoid resection and abscess drainage - Management per surgery - Monitor wound vac output.  -Pain control  Septic shock secondary peritonitis, e. Faecalis bacteremia.  -Zosyn, vancomycin.  Mica restarted on 3/2 in setting of worsening leukocytosis and TPN utilization  -F/u 3/2 cultures -Wean pressors: phenylephrine to norepinephrine, wean down as tolerated.  Continue vasopressin -Stress dose steroids 3/2  Acute hypoxic respiratory failure secondary to above and possible aspiration. -Full vent support -LTVV, 4-8cc/kg IBW with goal Pplat<30 and DP<15 -SBT/WUA as tolerated. Consider compassionate extubation tomorrow -VAP bundle  Atrial fibrillation: (eliquis). Now difficult to control likely due to sepsis and critical illness.  Acute on chronic HFpEF. (LVEF 50-55%) - Holding amio - Switch to heparin gtt due to AKI - 20L positive for the admission likely not totally accurate considering multiple abdominal surgeries.   Acute kidney injury - worsening, oliguric Hypokalemia -Monitor urine output and  creatinine -May be headed towards dialysis -Trial lasix  Anemia: Hgb 12 at presentation. Over the course of the admission has been in the 7-9 range. 5.6 this morning. Suspect this acute drop is in the post-operative setting + lovenox. S/p 2 units PRBC on 2/28.  No evidence of active bleed.  Monitor labs S/p 1 unit PRBC 3/2 - Trend  Severe protein calorie malnutrition present on admission -Able to tolerate tube feeds Will resume TPN  Severe deconditioning - Will need aggressive rehab when able  Goals of care Discussed with wife at bedside on 3/2 I am concerned about his worsening multiorgan failure and possible need for dialysis His wife tells me that he has been through a lot recently with multiple admissions, surgeries.  She does not think dialysis is a good idea for him and has agreed to no CPR or defibrillations.  We talked about changing focus to more comfort and possible withdrawal of care and she wants to think more about it.  Best Practice (right click and "Reselect all SmartList Selections" daily)   Diet/type: TPN DVT prophylaxis: heparin gtt due to AKI Pressure ulcer(s): N/A GI prophylaxis: PPI Lines: Left subclavian, art line-both still necessary Foley: Foley catheter still necessary Code Status:  limited Last date of multidisciplinary goals of care discussion: updated wife at bedside 3/2  The patient is critically ill with multiple organ systems failure and requires high complexity decision making for assessment and support, frequent evaluation and titration of therapies, application of advanced monitoring technologies and extensive interpretation of multiple databases.  Independent Critical Care Time: 40 Minutes.   Mechele Collin, M.D. [facility] Pulmonary/Critical Care Medicine 11/10/2023 10:35 AM   Please see Amion for pager number to reach  on-call Pulmonary and Critical Care Team.

## 2023-11-10 NOTE — Progress Notes (Signed)
 PHARMACY - TOTAL PARENTERAL NUTRITION CONSULT NOTE   Indication: Colonic ischemia   Patient Measurements: Height: 6\' 1"  (185.4 cm) Weight: 131.7 kg (290 lb 5.5 oz) IBW/kg (Calculated) : 79.9 TPN AdjBW (KG): 86.6 Body mass index is 38.31 kg/m. Usual Weight: ~250 lbs   Assessment:  Patient admitted 2/14 with septic shock in the setting of bowel perforation and enterococcus bacteremia. TTE on 2/16 negative for vegetations. In OR, patient found to have diffuse colonic distention with ischemia and gangrenous colonic necrosis with scattered areas of perforation and massive flocculent peritonitis. Due to prolonged ileus postoperatively, patient was on TPN 2/18- 2/22 and weaned off when tolerating tube feeds. On 2/26, patient went back to OR and was found to have perforating sigmoid stump. Stump appeared necrotic likely from initial process causing colonic ischemia.Pharmacy consulted for TPN overnight 2/27- 2/28. Per surgery 2/27 note, may restart TF after OR 2/28. Per discussion with CCM 2/28, ok to hold TPN for now given possible TF restart after surgery.   After OR 2/28 for abdominal closure, trickle feeds restarted. Unfortunately, patient vomited tube feeds on 3/1 am with worsening respiratory status and pressor requirements. Pharmacy re-consulted to start TPN on 3/2. Patient is volume overloaded (weight has increased by ~30 lbs per charting, using bed weights) but also on high pressor requirement with AKI and low UOP. Patient without nutrition since 2/26 pm. Will start concentrated TPN.   Glucose / Insulin: BG <180, ~4 units SSI/24 hr on hydrocort 100mg  BID  Electrolytes: Na 143 on D5W, K 3.5, CL 112, CO2 15, CoCa 9.5, Phos 4.6, Mg 2.1  Renal: Scr 2.57 up (BL 1.3), BUN 70 Hepatic: Alk phos/ALT wnl. AST 52, Tbili 1.9, TG 150 on 3/3 albumin <1.5 Intake / Output; MIVF: UOP 99 mL, NGT , drains 235 ml, ileostomy 225 ml; admission net + 20L (insensible losses not accounted for)  GI Imaging: 2/14  CT Ab/P - Substantial free intraperitoneal gas with ascites, exact site of perforation not well seen although colonic etiology favored given the diffuse colonic distension 2/26 CT: Hartmann pouch distended and fluid filled, diffuse SB distension, likely postop ileus; large extraluminal gas and fluid suggesting perforated viscus; possible developing fistula  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; ABThera wound VAC application  2/17 exploratory laparotomy, end ileostomy, fascia closure  2/26 lysis of adhesions. Excision of necrotic tissue and partial sigmoid resection.  2/28 exploration, abdominal closure, VAC placement.   Central access: 11/05/23 TPN start date:  2/18- 2/22, 3/2 >  Nutritional Goals:  Goal concentrated TPN rate is 75 mL/hr (provides 135 g of protein and 2313 kcals per day)  RD Assessment: Estimated Needs Total Energy Estimated Needs: 2300-2500 Total Protein Estimated Needs: 135-155 grams Total Fluid Estimated Needs: >2.3 L/day  Current Nutrition:   NPO + TPN Trickle TF ok per surg this AM   Plan:   Advance concentrated TPN to 75 mL/hr at 1800, 100% of estimated goals.  Electrolytes in TPN: Na 25 mEq/L, K 23 mEq/L, Ca 2 mEq/L, Mg 2 mEq/L, and Phos 0 mmol/L. Maximize Acetate Continue standard MVI and trace elements to TPN Continue Sensitive q6h SSI and adjust as needed  S/p thiamine 100mg  daily x3 days Monitor TPN labs daily until stable at goal then on Mon/Thurs  Calton Dach, PharmD, [facility] Clinical Pharmacist 11/10/2023 7:51 AM

## 2023-11-10 NOTE — Progress Notes (Incomplete)
 PHARMACY - ANTICOAGULATION CONSULT NOTE  Pharmacy Consult for lovenox >> Heparin   Indication: atrial fibrillation  Allergies  Allergen Reactions  . Zetia [Ezetimibe] Nausea And Vomiting    Patient Measurements: Height: 6\' 1"  (185.4 cm) Weight: 131.7 kg (290 lb 5.5 oz) IBW/kg (Calculated) : 79.9  Vital Signs: Temp: 96.5 F (35.8 C) (03/03 2000) Temp Source: Axillary (03/03 2000) BP: 96/69 (03/03 2045) Pulse Rate: 69 (03/03 2045)  Labs: Recent Labs    11/08/23 0539 11/08/23 1357 11/08/23 1719 11/09/23 0553 11/09/23 2142 11/10/23 0106 11/10/23 0149 11/10/23 0826 11/10/23 2227  HGB 7.3*   < > 8.3* 7.6*  --  6.8* 6.8* 8.1*  --   HCT 23.2*   < > 26.4* 24.7*  --  20.0* 21.4* 24.4*  --   PLT 280  --  303 242  --   --  174  --   --   LABPROT 15.0  --   --   --   --   --   --   --   --   INR 1.2  --   --   --   --   --   --   --   --   HEPARINUNFRC  --   --   --   --   --   --   --   --  >1.10*  CREATININE 1.30*  --  1.58* 2.00* 2.46*  --  2.57*  --   --    < > = values in this interval not displayed.    Estimated Creatinine Clearance: 44.6 mL/min (A) (by C-G formula based on SCr of 2.57 mg/dL (H)).   Assessment: 58 y.o. M well known to pharmacy from TPN dosing. Pt with new onset Afib started on full dose anticoagulation with Lovenox. Due to AKI and anemia, pharmacy consulted to transition from enoxaparin to heparin gtt. Last dose of lovenox given 3/3 ~0900.  -heparin level >1.1 on 1400 units/hr (supra-therapeutic). Per RN, no signs/symptoms of bleeding or issues with the heparin infusion running continuously.  Goal of Therapy:  Heparin level 0.3-0.7 units/ml Monitor platelets by anticoagulation protocol: Yes   Plan:  Hold heparin for 1h Decrease heparin to 1200 units/hr Heparin level in 6hr  Daily heparin level, CBC F/u s/sx bleeding  Arabella Merles, PharmD. Clinical Pharmacist 11/10/2023 11:52 PM

## 2023-11-10 NOTE — Progress Notes (Signed)
 Notified E-link RN of critical hemoglobin.

## 2023-11-10 NOTE — Progress Notes (Signed)
 Nutrition Follow-up  DOCUMENTATION CODES:   Not applicable  INTERVENTION:   TPN advanced to goal rate 3/3; meeting 100% of needs  As able consider:  Vital 1.5 @ 20 ml/hr   Goal rate:  Vital 1.5 @ 60 ml/hr 60 ml Prosource TF20 TID  Provides: 2400 kcal, 157 grams protein and 1100 ml free water  NUTRITION DIAGNOSIS:   Inadequate oral intake related to inability to eat as evidenced by NPO status. Ongoing.   GOAL:   Patient will meet greater than or equal to 90% of their needs Met with TPN at goal    MONITOR:   Labs, Skin, I & O's  REASON FOR ASSESSMENT:   Consult New TPN/TNA  ASSESSMENT:   58 yo male admitted with bowel perforation, massive feculent peritonitis. S/P ex lap, subtotal abdominal colectomy 2/14. PMH includes HTN, HLD, gout, brain aneurysm, prior alcohol abuse, former smoker.  Pt discussed during ICU rounds and with RN and MD. TF held. TPN restarted.  Ok for [employer] per CCS.  Per MD plan for one-way extubation tomorrow, 3/4.    2/14 -  s/p ex lap with subtotal abd colectomy left in discontinuity  2/15 - s/p re-exploration with creation of end ileostomy and closure  2/17 - s/p OR washout and end ileostomy  2/18 - started TPN 2/20 -  starting trickle TF, TPN advancing to goal this PM  2/21 - advance TF by 10 ml every 12 hours to goal rate of 60 ml/hr; extubated 2/24 - cortrak placed; tip gastric  2/26 - s/p ex lap, LOA > 60  min, partial sigmoid colon resection with anastomosis, excision of necrotic appearing fascia and subcutaneous tissue 2 cm x 10 cm; ~ 4.6 L of stool and pus drained from abd; NG tube placed  2/27 - s/p ex lap, abd closure, placement of VAC 2/28 - OR for abd closure, trickle TF started; pt vomited  3/2 - TPN started 3/3 - TPN to goal   Medications reviewed and include: solu-cortef, SSI every 6 hours, protonix, thiamine  TPN @ 75 ml/hr provides: 2313 kcal and 135 grams protein   Labs reviewed:  TG 150 CBG's: 113-164  UOP:  925 ml  VAC: 1350 ml Ileostomy: 600 ml NG: 550 ml (200 ml emesis)   Current weight: 128.5 kg Admission weight: 119.3 kg  UOP 99 ml  LUQ JP 215 ml  VAC: 20 ml  16 F NG tube: 1050 ml    Diet Order:   Diet Order             Diet NPO time specified  Diet effective now                   EDUCATION NEEDS:   Not appropriate for education at this time  Skin:  Skin Assessment: Skin Integrity Issues: Skin Integrity Issues:: Wound VAC Wound Vac: Abd  Last BM:  3/2 225 ml ileosomty output  Height:   Ht Readings from Last 1 Encounters:  10/29/23 6\' 1"  (1.854 m)    Weight:   Wt Readings from Last 1 Encounters:  11/09/23 131.7 kg    Ideal Body Weight:  83.6 kg  BMI:  Body mass index is 38.31 kg/m.  Estimated Nutritional Needs:   Kcal:  2300-2500  Protein:  135-155 grams  Fluid:  >2.3 L/day  Cammy Copa., RD, LDN, CNSC See AMiON for contact information

## 2023-11-10 NOTE — Progress Notes (Signed)
 Pt placed back on full support due to increased WOB and desaturation. RT will monitor.

## 2023-11-10 NOTE — Progress Notes (Signed)
 Notified E-link RN of low urine output. Will continue to monitor.

## 2023-11-10 NOTE — Progress Notes (Signed)
 Pt placed on PS/CPAP 5/5 on 40% and is tolerating well. RT will monitor.

## 2023-11-10 NOTE — Progress Notes (Addendum)
 PHARMACY - ANTICOAGULATION CONSULT NOTE  Pharmacy Consult for lovenox >> Heparin   Indication: atrial fibrillation  Allergies  Allergen Reactions   Zetia [Ezetimibe] Nausea And Vomiting    Patient Measurements: Height: 6\' 1"  (185.4 cm) Weight: 131.7 kg (290 lb 5.5 oz) IBW/kg (Calculated) : 79.9  Vital Signs: Temp: 97.8 F (36.6 C) (03/03 0800) Temp Source: Axillary (03/03 0800) Pulse Rate: 80 (03/03 1000)  Labs: Recent Labs    11/08/23 0539 11/08/23 1357 11/08/23 1719 11/09/23 0553 11/09/23 2142 11/10/23 0106 11/10/23 0149 11/10/23 0826  HGB 7.3*   < > 8.3* 7.6*  --  6.8* 6.8* 8.1*  HCT 23.2*   < > 26.4* 24.7*  --  20.0* 21.4* 24.4*  PLT 280  --  303 242  --   --  174  --   LABPROT 15.0  --   --   --   --   --   --   --   INR 1.2  --   --   --   --   --   --   --   CREATININE 1.30*  --  1.58* 2.00* 2.46*  --  2.57*  --    < > = values in this interval not displayed.    Estimated Creatinine Clearance: 44.6 mL/min (A) (by C-G formula based on SCr of 2.57 mg/dL (H)).   Assessment: 58 y.o. M well known to pharmacy from TPN dosing. Pt with new onset Afib started on full dose anticoagulation with Lovenox. Due to AKI and anemia, pharmacy consulted to transition from enoxaparin to heparin gtt.   Goal of Therapy:  Heparin level 0.3-0.7 units/ml Anti-Xa level 0.6-1 units/ml 4hrs after LMWH dose given Monitor platelets by anticoagulation protocol: Yes   Plan:  D/c enoxaparin; start heparin infusion 8h after enoxaparin dose Start heparin at 1400 units/hr at 1700 Heparin level in 6hr  Daily HL , CBC F/u s/sx bleeding  Calton Dach, PharmD, BCCCP Clinical Pharmacist 11/10/2023 10:45 AM

## 2023-11-10 NOTE — Progress Notes (Signed)
 PHARMACY - ANTICOAGULATION CONSULT NOTE  Pharmacy Consult for lovenox >> Heparin   Indication: atrial fibrillation  Allergies  Allergen Reactions   Zetia [Ezetimibe] Nausea And Vomiting    Patient Measurements: Height: 6\' 1"  (185.4 cm) Weight: 131.7 kg (290 lb 5.5 oz) IBW/kg (Calculated) : 79.9  Vital Signs: Temp: 96.5 F (35.8 C) (03/03 2000) Temp Source: Axillary (03/03 2000) BP: 96/69 (03/03 2045) Pulse Rate: 69 (03/03 2045)  Labs: Recent Labs    11/08/23 0539 11/08/23 1357 11/08/23 1719 11/09/23 0553 11/09/23 2142 11/10/23 0106 11/10/23 0149 11/10/23 0826 11/10/23 2227  HGB 7.3*   < > 8.3* 7.6*  --  6.8* 6.8* 8.1*  --   HCT 23.2*   < > 26.4* 24.7*  --  20.0* 21.4* 24.4*  --   PLT 280  --  303 242  --   --  174  --   --   LABPROT 15.0  --   --   --   --   --   --   --   --   INR 1.2  --   --   --   --   --   --   --   --   HEPARINUNFRC  --   --   --   --   --   --   --   --  >1.10*  CREATININE 1.30*  --  1.58* 2.00* 2.46*  --  2.57*  --   --    < > = values in this interval not displayed.    Estimated Creatinine Clearance: 44.6 mL/min (A) (by C-G formula based on SCr of 2.57 mg/dL (H)).   Assessment: 58 y.o. M well known to pharmacy from TPN dosing. Pt with new onset Afib started on full dose anticoagulation with Lovenox. Due to AKI and anemia, pharmacy consulted to transition from enoxaparin to heparin gtt. Last dose of lovenox given 3/3 ~0900.  -heparin level >1.1 on 1400 units/hr (supra-therapeutic). Per RN, no signs/symptoms of bleeding or issues with the heparin infusion running continuously. Level drawn appropriately.  Goal of Therapy:  Heparin level 0.3-0.7 units/ml Monitor platelets by anticoagulation protocol: Yes   Plan:  Hold heparin for 1h Decrease heparin to 1200 units/hr Heparin level in 6hr  Daily heparin level, CBC F/u s/sx bleeding  Arabella Merles, PharmD. Clinical Pharmacist 11/10/2023 11:52 PM

## 2023-11-10 NOTE — Consult Note (Signed)
 WOC Nurse Consult Note: Reason for Consult: Change the VAC dressing Wound type: midline surgical site. Measurement: 21 x 3 cm x 6 cm Wound bed: 100% red pale. Drainage (amount, consistency, odor) moderated drain, serosanguinous. Periwound: intact Dressing procedure/placement/frequency: Removed old NPWT dressing Cleansed wound with normal saline Periwound skin protected with skin barrier wipe or window framed with drape Skin protected to the hip with VAC drape for foam bridge  Filled wound with  2 piece of black foam  Sealed NPWT dressing at HG/144mmHG  Patient received IV/PO pain medication per bedside nurse prior to dressing change Patient tolerated procedure well  WOC nurse will continue to provide NPWT dressing changed due to the complexity of the dressing change.    Follow THURS.   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 Stomal assessment/size: Round, red and viable with edema. 38 mm x 38 mm Peristomal assessment: intact. Has some hair peristoma. Treatment options for stomal/peristomal skin: ring. Output 50 ml liquid brown/red. Ostomy pouching: 2pc. Bag [id number] and barrier [id number]. Education provided: Discussed with the wife about ostomy care. The pt is in critical illness and not able to have instructions. - Clean the skin with water and soup. Avoid any cream or moisture surrounding the ostomy. - Cut as the same size and shape as the ostomy. -Apply the ring on the barrier, surrounding the cut. Match the bottom part of the ostomy first, and gentle adjust the rest of the ostomy barrier on the skin. - Teach how to open and close the lock in roll system. - The hole system will be change twice a week. - The bag can be empty opening the lock in roll system many times per day, when the bag is 1/3 full.  Enrolled patient in [employer] DC program: No   WOC team will follow THURS.   Please reconsult if further assistance is  needed. Thank-you,  Denyse Amass BSN, RN, [employer], WOC  (Pager: [phone])

## 2023-11-10 NOTE — Progress Notes (Signed)
 PT Cancellation Note  Patient Details Name: Daniel Butler MRN: [id number] DOB: [birth year]   Cancelled Treatment:    Reason Eval/Treat Not Completed: Patient not medically ready (discussed with RN; as this is the 3rd cancel due to medical decline, will sign off. Please re-consult when pt is appropriate to participate in therapy).  [name], PT, DPT Acute Rehabilitation Services Office [phone]    [name] 11/10/2023, 9:50 AM

## 2023-11-11 DIAGNOSIS — A4181 Sepsis due to Enterococcus: Secondary | ICD-10-CM | POA: Diagnosis not present

## 2023-11-11 DIAGNOSIS — K55069 Acute infarction of intestine, part and extent unspecified: Secondary | ICD-10-CM | POA: Diagnosis not present

## 2023-11-11 DIAGNOSIS — J9601 Acute respiratory failure with hypoxia: Secondary | ICD-10-CM | POA: Diagnosis not present

## 2023-11-11 DIAGNOSIS — I4891 Unspecified atrial fibrillation: Secondary | ICD-10-CM | POA: Diagnosis not present

## 2023-11-11 LAB — TYPE AND SCREEN
ABO/RH(D): A NEG
Antibody Screen: NEGATIVE
Unit division: 0

## 2023-11-11 LAB — BASIC METABOLIC PANEL
Anion gap: 15 (ref 5–15)
BUN: 97 mg/dL — ABNORMAL HIGH (ref 6–20)
CO2: 14 mmol/L — ABNORMAL LOW (ref 22–32)
Calcium: 7.8 mg/dL — ABNORMAL LOW (ref 8.9–10.3)
Chloride: 115 mmol/L — ABNORMAL HIGH (ref 98–111)
Creatinine, Ser: 3.23 mg/dL — ABNORMAL HIGH (ref 0.61–1.24)
GFR, Estimated: 21 mL/min — ABNORMAL LOW (ref >60)
Glucose, Bld: 146 mg/dL — ABNORMAL HIGH (ref 70–99)
Potassium: 3.1 mmol/L — ABNORMAL LOW (ref 3.5–5.1)
Sodium: 144 mmol/L (ref 135–145)

## 2023-11-11 LAB — HEPARIN LEVEL (UNFRACTIONATED): Heparin Unfractionated: 1.05 [IU]/mL — ABNORMAL HIGH (ref 0.30–0.70)

## 2023-11-11 LAB — BPAM RBC
Blood Product Expiration Date: 202503182359
ISSUE DATE / TIME: 202503030455
Unit Type and Rh: 600

## 2023-11-11 LAB — PHOSPHORUS: Phosphorus: 4 mg/dL (ref 2.5–4.6)

## 2023-11-11 LAB — GLUCOSE, CAPILLARY
Glucose-Capillary: 137 mg/dL — ABNORMAL HIGH (ref 70–99)
Glucose-Capillary: 150 mg/dL — ABNORMAL HIGH (ref 70–99)

## 2023-11-11 LAB — VANCOMYCIN, RANDOM: Vancomycin Rm: 37 ug/mL

## 2023-11-11 LAB — MAGNESIUM: Magnesium: 2.1 mg/dL (ref 1.7–2.4)

## 2023-11-11 MED ORDER — CLONAZEPAM 1 MG PO TABS
1.0000 mg | ORAL_TABLET | Freq: Two times a day (BID) | ORAL | Status: DC
  Administered 2023-11-11: 1 mg
  Filled 2023-11-11: qty 1

## 2023-11-11 MED ORDER — HEPARIN (PORCINE) 25000 UT/250ML-% IV SOLN: 1000.0000 [IU]/h | INTRAVENOUS | Status: DC

## 2023-11-11 MED ORDER — ALBUTEROL SULFATE (2.5 MG/3ML) 0.083% IN NEBU: 2.5000 mg | INHALATION_SOLUTION | Freq: Four times a day (QID) | RESPIRATORY_TRACT | Status: DC | PRN

## 2023-11-11 MED ORDER — GLYCOPYRROLATE 1 MG PO TABS: 1.0000 mg | ORAL_TABLET | ORAL | Status: DC | PRN

## 2023-11-11 MED ORDER — GLYCOPYRROLATE 0.2 MG/ML IJ SOLN: 0.2000 mg | INTRAMUSCULAR | Status: DC | PRN

## 2023-11-11 MED ORDER — ACETAMINOPHEN 325 MG PO TABS: 650.0000 mg | ORAL_TABLET | Freq: Four times a day (QID) | ORAL | Status: DC | PRN

## 2023-11-11 MED ORDER — GLYCOPYRROLATE 0.2 MG/ML IJ SOLN
0.2000 mg | INTRAMUSCULAR | Status: DC | PRN
  Administered 2023-11-11: 0.2 mg via INTRAVENOUS
  Filled 2023-11-11: qty 1

## 2023-11-11 MED ORDER — ONDANSETRON 4 MG PO TBDP: 4.0000 mg | ORAL_TABLET | Freq: Four times a day (QID) | ORAL | Status: DC | PRN

## 2023-11-11 MED ORDER — LORAZEPAM 2 MG/ML IJ SOLN: 2.0000 mg | INTRAMUSCULAR | Status: DC | PRN

## 2023-11-11 MED ORDER — HYDROCORTISONE SOD SUC (PF) 100 MG IJ SOLR: 50.0000 mg | Freq: Two times a day (BID) | INTRAMUSCULAR | Status: DC

## 2023-11-11 MED ORDER — TRACE MINERALS CU-MN-SE-ZN 300-55-60-3000 MCG/ML IV SOLN
INTRAVENOUS | Status: DC
  Filled 2023-11-11: qty 900

## 2023-11-11 MED ORDER — POTASSIUM CHLORIDE 10 MEQ/50ML IV SOLN
10.0000 meq | INTRAVENOUS | Status: DC
  Administered 2023-11-11: 10 meq via INTRAVENOUS
  Filled 2023-11-11: qty 50

## 2023-11-11 MED ORDER — ACETAMINOPHEN 650 MG RE SUPP: 650.0000 mg | Freq: Four times a day (QID) | RECTAL | 0 refills | Status: DC | PRN

## 2023-11-11 MED ORDER — ONDANSETRON HCL 4 MG/2ML IJ SOLN: 4.0000 mg | Freq: Four times a day (QID) | INTRAMUSCULAR | Status: DC | PRN

## 2023-11-11 MED ORDER — HEPARIN (PORCINE) 25000 UT/250ML-% IV SOLN
1200.0000 [IU]/h | INTRAVENOUS | Status: DC
  Administered 2023-11-11: 1200 [IU]/h via INTRAVENOUS

## 2023-11-11 MED ORDER — MORPHINE SULFATE (PF) 2 MG/ML IV SOLN
2.0000 mg | INTRAVENOUS | Status: DC | PRN
  Administered 2023-11-11 (×2): 2 mg via INTRAVENOUS
  Administered 2023-11-11: 4 mg via INTRAVENOUS
  Filled 2023-11-11 (×3): qty 1
  Filled 2023-11-11: qty 2

## 2023-11-11 MED ORDER — POLYVINYL ALCOHOL 1.4 % OP SOLN: 1.0000 [drp] | Freq: Four times a day (QID) | OPHTHALMIC | Status: DC | PRN

## 2023-11-11 MED ORDER — SODIUM CHLORIDE 0.9 % IV SOLN: INTRAVENOUS | Status: DC

## 2023-11-11 MED ORDER — ACETAMINOPHEN 650 MG RE SUPP: 650.0000 mg | Freq: Four times a day (QID) | RECTAL | Status: DC | PRN

## 2023-11-11 MED ORDER — HYDROCORTISONE SOD SUC (PF) 100 MG IJ SOLR: 50.0000 mg | Freq: Every day | INTRAMUSCULAR | Status: DC

## 2023-11-11 MED ORDER — LORAZEPAM 2 MG/ML IJ SOLN
2.0000 mg | INTRAMUSCULAR | Status: DC | PRN
  Administered 2023-11-11: 2 mg via INTRAVENOUS
  Filled 2023-11-11: qty 1

## 2023-11-12 ENCOUNTER — Telehealth: Payer: Self-pay | Admitting: Family

## 2023-11-12 NOTE — Telephone Encounter (Signed)
 Copied from CRM [id number]. Topic: General - Deceased Patient >> Dec 03, 2023  3:16 PM Almira Coaster wrote: Name of caller: Byrd Hesselbach / [facility]  Date of death: Dec 02, 2023   Name of funeral home: [facility]   Phone number of funeral home:   Provider that needs to sign form: N/A  Timeline for signing: N/A

## 2023-11-14 LAB — CULTURE, BLOOD (ROUTINE X 2)
Culture: NO GROWTH
Culture: NO GROWTH

## 2023-12-09 NOTE — Progress Notes (Signed)
 Transport at bedside to take pt to [facility]. PRN given prior to moving over to stretcher for comfort.

## 2023-12-09 NOTE — Progress Notes (Signed)
 SLP Cancellation Note  Patient Details Name: Daniel Butler MRN: [id number] DOB: 04-13-1966   Cancelled treatment:       Reason Eval/Treat Not Completed: Medical issues which prohibited therapy (Remains on vent. Will f/u next date.)  Ferdinand Lango MA, CCC-SLP  Aidenjames Heckmann Meryl 11/22/2023, 8:27 AM

## 2023-12-09 NOTE — IPAL (Signed)
  Interdisciplinary Goals of Care Family Meeting   Date carried out: 12/04/2023  Location of the meeting: Bedside  Member's involved: Physician and Family Member or next of kin  Durable Power of Attorney or acting medical decision maker: Patient and wife    Discussion: We discussed goals of care for Daniel Butler .  Transition to comfort care. Requesting hospice services.  Code status:   Code Status: Do not attempt resuscitation (DNR) - Comfort care   Disposition: In-patient comfort care  Time spent for the meeting: 10 min    Aamya Orellana Mechele Collin, MD  11/29/2023, 11:23 AM

## 2023-12-09 NOTE — Discharge Summary (Signed)
 Physician Discharge Summary  Patient ID: Daniel Butler MRN: [id number] DOB/AGE: 59-28-1967 59 y.o.  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 In the ED patient was febrile with temperature 103.4, tachypneic, and tachycardic.  Lab work significant for K 2.4, glucose 116, creatinine 3.12, anion gap 26, alkaline phosphate 33, albumin 2.5, lactic 10.3, WBC 19.6.  CT abdomen and pelvis obtained which revealed substantial free air concerning for acute bowel perforation.  Surgery consulted and patient underwent exploratory laparotomy which revealed diffuse colonic distention with ischemia and gangrenous colonic necrosis with scattered areas of perforation and massive flocculent peritonitis.  Postprocedure patient remained intubated and sedated, PCCM consulted for further management admission.  Significant Hospital Events: Including procedures, antibiotic start and stop dates in addition to other pertinent events   2/14 presented with abdominal pain and distention CT concerning for pneumoperitoneum for which surgery was consulted, patient underwent exploratory lap which revealed revealed diffuse colonic distention with ischemia and gangrenous colonic necrosis with  scattered areas of perforation and massive flocculent peritonitis. OR for subtotal colectomy, intestinal discontinuity  2/15: con't on pressor support, ID consulted 2/17: exploratory laparotomy, end ileostomy 2/18: goal to wake up and wean, will need TPN  2/19: waking, gets tachypneic, tachycardic with SBT 2/20: failed SBT. Starting trickle feeds, lasix BID, PO oxy and klonopin  2/21: extubated. 2/26 fluid bolus and free water per tube added 2/26 reintubated for respiratory distress secondary to possible aspiration during abdominal CT scan.  Brought back to operating room for perforated sigmoid stump.  Underwent lysis of adhesions.  Excision of necrotic tissue and partial sigmoid resection. 2/28 Went back to the OR for fascial closure. S/p 2 units PRBC.  No evidence of active bleed. Treatment dose Lovenox held 3/1 rapid A-fib, Levophed changed to phenylephrine.  Amio drip and bolus.  Tube feeds held due to emesis, concern for aspiration, restarted on treatment dose Lovenox 3/3 Overnight had episodes of pauses/bradycardia. Amio was turned off. Bicarb amp given. Transfused PRBC x 1 unit overnight for Hg 6.8 3/4 Weaned off pressors. One way extubation today. Worsening renal failure/oliguric  Goals of care discussed with family with multiorgan failure in setting of shock and possible need for dialysis. Primary team discussed with wife regarding multiple admissions and surgeries and decision made to not pursue dialysis and transitioned to DNR/DNI. One way extubation on 12/03/2023. Patient stable and eligible for hospice. [facility] has accepted patient and he will be discharged on 12/03/2023.  Consults: pulmonary/intensive care and general surgery  Discharge Exam: Blood pressure 108/66, pulse (!) 152, temperature 97.8 F (36.6 C), temperature source Axillary, resp. rate 20, height 6\' 1"  (1.854 m), weight 126 kg, SpO2 91%. Physical Exam: General: Chronically ill-appearing, no acute distress, mildly  anxious HENT: [place], AT, ETT in place Eyes: EOMI, no scleral icterus Respiratory: Clear to auscultation bilaterally.  No  crackles, wheezing or rales Cardiovascular: RRR, -M/R/G, no JVD GI: Wound vac in place, JP drain in place, BS+, soft, nontender Extremities: 3+ edema,-tenderness Neuro: Awake and alert, follows commands, CNII-XII grossly intact GU: Foley in place  Disposition: Stable, hospice  Allergies as of 11/14/2023       Reactions   Zetia [ezetimibe] Nausea And Vomiting        Medication List     STOP taking these medications    allopurinol 100 MG tablet Commonly known as: ZYLOPRIM   ALPRAZolam 0.5 MG tablet Commonly known as: XANAX   apixaban 5 MG Tabs tablet Commonly known as: ELIQUIS   ascorbic acid 500 MG tablet Commonly known as: VITAMIN C   diltiazem 240 MG 24 hr capsule Commonly known as: CARDIZEM CD   empagliflozin 10 MG Tabs tablet Commonly known as: JARDIANCE   fenofibrate 145 MG tablet Commonly known as: TRICOR   ferrous sulfate 325 (65 FE) MG EC tablet   folic acid 1 MG tablet Commonly known as: FOLVITE   furosemide 40 MG tablet Commonly known as: LASIX   gabapentin 100 MG capsule Commonly known as: NEURONTIN   guaiFENesin 600 MG 12 hr tablet Commonly known as: MUCINEX   icosapent Ethyl 1 g capsule Commonly known as: Vascepa   metoprolol succinate 25 MG 24 hr tablet Commonly known as: TOPROL-XL   midodrine 10 MG tablet Commonly known as: PROAMATINE   multivitamin with minerals Tabs tablet   pantoprazole 40 MG tablet Commonly known as: PROTONIX   polyethylene glycol 17 g packet Commonly known as: MIRALAX / GLYCOLAX   rosuvastatin 40 MG tablet Commonly known as: CRESTOR   thiamine 100 MG tablet Commonly known as: Vitamin B-1       TAKE these medications    acetaminophen 325 MG tablet Commonly known as: TYLENOL Take 2 tablets (650 mg total) by mouth every 6 (six) hours as needed for mild pain (pain score 1-3) or  moderate pain (pain score 4-6). What changed: Another medication with the same name was added. Make sure you understand how and when to take each.   acetaminophen 325 MG tablet Commonly known as: TYLENOL Place 2 tablets (650 mg total) into feeding tube every 6 (six) hours as needed (Fever >/= 101). What changed: You were already taking a medication with the same name, and this prescription was added. Make sure you understand how and when to take each.   acetaminophen 650 MG suppository Commonly known as: TYLENOL Place 1 suppository (650 mg total) rectally every 6 (six) hours as needed (Fever >/= 101). What changed: You were already taking a medication with the same name, and this prescription was added. Make sure you understand how and when to take each.   albuterol (2.5 MG/3ML) 0.083% nebulizer solution Commonly known as: PROVENTIL Take 3 mLs (2.5 mg total) by nebulization every 6 (six) hours as needed for wheezing.   glycopyrrolate 1 MG tablet Commonly known as: ROBINUL Place 1 tablet (1 mg total) into feeding tube every 4 (four) hours as needed (excessive secretions).   glycopyrrolate 0.2 MG/ML injection Commonly known as: ROBINUL Inject 1 mL (0.2 mg total) into the skin every 4 (four) hours as needed (excessive secretions).   glycopyrrolate 0.2 MG/ML injection Commonly known as: ROBINUL Inject 1 mL (0.2 mg total) into the vein every 4 (four) hours as needed (excessive secretions).   LORazepam 2 MG/ML injection Commonly known as: ATIVAN Inject 1-2 mLs (2-4 mg total) into the vein every 4 (four) hours  as needed for anxiety or seizure.   morphine (PF) 2 MG/ML injection Inject 1-2 mLs (2-4 mg total) into the vein every 30 (thirty) minutes as needed (severe pain, dyspnea; if not achieving comfort, call MD for infusion orders).   ondansetron 4 MG disintegrating tablet Commonly known as: ZOFRAN-ODT Take 1 tablet (4 mg total) by mouth every 6 (six) hours as needed for nausea.    ondansetron 4 MG/2ML Soln injection Commonly known as: ZOFRAN Inject 2 mLs (4 mg total) into the vein every 6 (six) hours as needed for nausea.   polyvinyl alcohol 1.4 % ophthalmic solution Commonly known as: LIQUIFILM TEARS Place 1 drop into both eyes 4 (four) times daily as needed for dry eyes.         Signed: Ugochukwu Chichester Mechele Collin 11/22/2023, 1:50 PM

## 2023-12-09 NOTE — Progress Notes (Signed)
 PHARMACY - TOTAL PARENTERAL NUTRITION CONSULT NOTE   Indication: Colonic ischemia   Patient Measurements: Height: 6\' 1"  (185.4 cm) Weight: 126 kg (277 lb 12.5 oz) IBW/kg (Calculated) : 79.9 TPN AdjBW (KG): 86.6 Body mass index is 36.65 kg/m. Usual Weight: ~250 lbs   Assessment:  Patient admitted 2/14 with septic shock in the setting of bowel perforation and enterococcus bacteremia. TTE on 2/16 negative for vegetations. In OR, patient found to have diffuse colonic distention with ischemia and gangrenous colonic necrosis with scattered areas of perforation and massive flocculent peritonitis. Due to prolonged ileus postoperatively, patient was on TPN 2/18- 2/22 and weaned off when tolerating tube feeds. On 2/26, patient went back to OR and was found to have perforating sigmoid stump. Stump appeared necrotic likely from initial process causing colonic ischemia.Pharmacy consulted for TPN overnight 2/27- 2/28. Per surgery 2/27 note, may restart TF after OR 2/28. Per discussion with CCM 2/28, ok to hold TPN for now given possible TF restart after surgery.   After OR 2/28 for abdominal closure, trickle feeds restarted. Unfortunately, patient vomited tube feeds on 3/1 am with worsening respiratory status and pressor requirements. Pharmacy re-consulted to start TPN on 3/2. Patient is volume overloaded (weight has increased by ~30 lbs per charting, using bed weights) but also on high pressor requirement with AKI and low UOP. Patient without nutrition since 2/26 pm. Will start concentrated TPN.   Glucose / Insulin: BG <180, ~3 units SSI/24 hr on hydrocort 100mg  BID  Electrolytes: Na 144, K 3.1, CL 115, CO2 14, CoCa 9.5, Phos 4.0, Mg 2.1  Renal: Scr 3.23 up (BL 1.3), BUN 97 Hepatic: Alk phos/ALT wnl. AST 52, Tbili 1.9, TG 150 on 3/3 albumin <1.5 Intake / Output; MIVF: UOP 170 mL, NGT , drains 285 ml, ileostomy 0 ml; admission net + 21L (insensible losses not accounted for)  GI Imaging: 2/14 CT Ab/P  - Substantial free intraperitoneal gas with ascites, exact site of perforation not well seen although colonic etiology favored given the diffuse colonic distension 2/26 CT: Hartmann pouch distended and fluid filled, diffuse SB distension, likely postop ileus; large extraluminal gas and fluid suggesting perforated viscus; possible developing fistula  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; ABThera wound VAC application  2/17 exploratory laparotomy, end ileostomy, fascia closure  2/26 lysis of adhesions. Excision of necrotic tissue and partial sigmoid resection.  2/28 exploration, abdominal closure, VAC placement.   Central access: 11/05/23 TPN start date:  2/18- 2/22, 3/2>  Nutritional Goals:  Goal concentrated TPN rate is 75 mL/hr (provides 135 g of protein and 2313 kcals per day)  RD Assessment: Estimated Needs Total Energy Estimated Needs: 2300-2500 Total Protein Estimated Needs: 135-155 grams Total Fluid Estimated Needs: >2.3 L/day  Current Nutrition:   NPO + TPN Trickle TF ok per surg this AM   Plan:   Advance concentrated TPN to 75 mL/hr at 1800, 100% of estimated goals.  Electrolytes in TPN: Na 25 mEq/L, K 28 mEq/L (modest increase d/t AKI), Ca 2 mEq/L, Mg 2 mEq/L, and Phos 0 mmol/L. Maximize Acetate KCL IV x3  Continue standard MVI and trace elements to TPN Continue Sensitive q6h SSI and adjust as needed  S/p thiamine 100mg  daily x3 days Monitor TPN labs daily until stable at goal then on Mon/Thurs  Calton Dach, PharmD, [facility] Clinical Pharmacist 12/01/2023 8:27 AM

## 2023-12-09 NOTE — Progress Notes (Signed)
 Patient transitioned to comfort care earlier today post extubation. Pastoral care present at bedside. PRNs given as needed. Report given to [facility], transport has been called. Patient and family updated in plan of care.

## 2023-12-09 NOTE — Progress Notes (Signed)
 4 Days Post-Op   Subjective/Chief Complaint: On vent, follows commands   Objective: Vital signs in last 24 hours: Temp:  [96.2 F (35.7 C)-98.1 F (36.7 C)] 98.1 F (36.7 C) (03/04 0400) Pulse Rate:  [54-126] 78 (03/04 0615) Resp:  [13-29] 26 (03/04 0615) BP: (96-132)/(56-72) 132/71 (03/04 0600) SpO2:  [90 %-100 %] 94 % (03/04 0615) Arterial Line BP: (93-155)/(48-85) 146/71 (03/04 0615) FiO2 (%):  [40 %] 40 % (03/04 0329) Weight:  [454 kg] 126 kg (03/04 0500) Last BM Date : 11/10/23  Intake/Output from previous day: 03/03 0701 - 03/04 0700 In: 2379.9 [I.V.:2171.4; IV Piggyback:208.5] Out: 1455 [Urine:170; Emesis/NG output:1000; Drains:285] Intake/Output this shift: No intake/output data recorded.  Ab soft vac in place, stoma functional, drain serosang   Lab Results:  Recent Labs    11/09/23 0553 11/10/23 0106 11/10/23 0149 11/10/23 0826  WBC 19.5*  --  18.6*  --   HGB 7.6*   < > 6.8* 8.1*  HCT 24.7*   < > 21.4* 24.4*  PLT 242  --  174  --    < > = values in this interval not displayed.   BMET Recent Labs    11/10/23 0149 11/19/2023 0516  NA 143 144  K 3.5 3.1*  CL 112* 115*  CO2 15* 14*  GLUCOSE 177* 146*  BUN 70* 97*  CREATININE 2.57* 3.23*  CALCIUM 7.5* 7.8*   PT/INR No results for input(s): "LABPROT", "INR" in the last 72 hours. ABG Recent Labs    11/08/23 1357 11/10/23 0106  PHART 7.202* 7.316*  HCO3 20.4 14.9*    Studies/Results: DG Chest Port 1 View Result Date: 11/10/2023 CLINICAL DATA:  Acute respiratory failure. EXAM: PORTABLE CHEST 1 VIEW COMPARISON:  11/09/2023 FINDINGS: The cardio pericardial silhouette is enlarged. Left subclavian central line tip overlies the mid SVC level. NG tube tip overlies the gastric fundus. Endotracheal tube tip is approximately 7.5 cm above the base of the carina. NG tube passes into the stomach although the distal tip position is not included on the film. There is bibasilar collapse/consolidation with small to  moderate bilateral pleural effusions. Telemetry leads overlie the chest. IMPRESSION: 1. Bibasilar collapse/consolidation with small to moderate bilateral pleural effusions. 2. Endotracheal tube tip is approximately 7.5 cm above the base of the carina. Electronically Signed   By: Kennith Center M.D.   On: 11/10/2023 09:49   DG CHEST PORT 1 VIEW Result Date: 11/09/2023 CLINICAL DATA:  Acute respiratory failure. EXAM: PORTABLE CHEST 1 VIEW COMPARISON:  Radiographs 11/08/2023 and 11/06/2023. Chest CT 08/31/2023. FINDINGS: 0842 hours. Tip of the endotracheal tube is difficult to differentiate from other overlying leads and tubes, but is grossly unchanged at the midtrachea level. Enteric and feeding tubes project below the diaphragm. Left subclavian central venous catheter projects to the lower SVC level. The heart size and mediastinal contours are stable. Unchanged bibasilar airspace opacities and bilateral pleural effusions. No pneumothorax. The bones appear unchanged. Numerous telemetry leads overlie the chest. IMPRESSION: 1. Unchanged bibasilar airspace opacities and bilateral pleural effusions. 2. Support system as described. Electronically Signed   By: Carey Bullocks M.D.   On: 11/09/2023 12:38   DG Abd 1 View Result Date: 11/09/2023 CLINICAL DATA:  History of abdominal surgery. There is a nasogastric tube and feeding tube. EXAM: ABDOMEN - 1 VIEW COMPARISON:  11/05/2023 FINDINGS: There is a feeding tube and nasogastric tube within the left upper quadrant of the abdomen with tips in the expected location of the gastric lumen. Drainage  catheter overlies the left lower quadrant of the abdomen. Several dilated loops of small bowel measuring up to 4 cm noted in the left lower quadrant of the abdomen. Decreased aeration of both lung bases again noted. IMPRESSION: 1. Feeding tube and nasogastric tube within the left upper quadrant of the abdomen with tips in the expected location of the gastric lumen. 2. Several  dilated loops of small bowel in the left lower quadrant of the abdomen. Electronically Signed   By: Signa Kell M.D.   On: 11/09/2023 09:19    Anti-infectives: Anti-infectives (From admission, onward)    Start     Dose/Rate Route Frequency Ordered Stop   11/27/2023 1500  micafungin (MYCAMINE) 100 mg in sodium chloride 0.9 % 100 mL IVPB        100 mg 105 mL/hr over 1 Hours Intravenous Every 24 hours 11/10/23 1334     11/09/23 1115  micafungin (MYCAMINE) 100 mg in sodium chloride 0.9 % 100 mL IVPB  Status:  Discontinued        100 mg 105 mL/hr over 1 Hours Intravenous Every 24 hours 11/09/23 1019 11/10/23 1334   11/09/23 0735  vancomycin variable dose per unstable renal function (pharmacist dosing)         Does not apply See admin instructions 11/09/23 0735     11/07/23 2200  piperacillin-tazobactam (ZOSYN) IVPB 3.375 g        3.375 g 12.5 mL/hr over 240 Minutes Intravenous Every 8 hours 11/07/23 1407     11/07/23 2000  vancomycin (VANCOREADY) IVPB 1750 mg/350 mL  Status:  Discontinued        1,750 mg 175 mL/hr over 120 Minutes Intravenous Every 24 hours 11/07/23 1102 11/09/23 0735   11/06/23 0000  vancomycin (VANCOREADY) IVPB 1250 mg/250 mL  Status:  Discontinued        1,250 mg 166.7 mL/hr over 90 Minutes Intravenous Every 24 hours 11/05/23 2320 11/07/23 1102   10/25/23 1400  piperacillin-tazobactam (ZOSYN) IVPB 3.375 g  Status:  Discontinued        3.375 g 12.5 mL/hr over 240 Minutes Intravenous Every 8 hours 10/25/23 0942 11/07/23 1407   10/25/23 0000  ceFEPIme (MAXIPIME) 2 g in sodium chloride 0.9 % 100 mL IVPB  Status:  Discontinued        2 g 200 mL/hr over 30 Minutes Intravenous Every 12 hours 10/24/23 1853 10/25/23 0942   10/24/23 2300  piperacillin-tazobactam (ZOSYN) IVPB 3.375 g  Status:  Discontinued        3.375 g 12.5 mL/hr over 240 Minutes Intravenous Every 8 hours 10/24/23 1509 10/24/23 1853   10/24/23 2300  metroNIDAZOLE (FLAGYL) IVPB 500 mg  Status:  Discontinued         500 mg 100 mL/hr over 60 Minutes Intravenous Every 12 hours 10/24/23 1838 10/25/23 0942   10/24/23 2000  micafungin (MYCAMINE) 100 mg in sodium chloride 0.9 % 100 mL IVPB  Status:  Discontinued        100 mg 105 mL/hr over 1 Hours Intravenous Every 24 hours 10/24/23 1838 10/29/23 1152   10/24/23 1945  vancomycin (VANCOREADY) IVPB 2000 mg/400 mL        2,000 mg 200 mL/hr over 120 Minutes Intravenous NOW 10/24/23 1854 10/24/23 2239   10/24/23 1854  vancomycin variable dose per unstable renal function (pharmacist dosing)  Status:  Discontinued         Does not apply See admin instructions 10/24/23 1854 10/25/23 0942   10/24/23 1245  ceFEPIme (MAXIPIME) 2 g in sodium chloride 0.9 % 100 mL IVPB        2 g 200 mL/hr over 30 Minutes Intravenous  Once 10/24/23 1236 10/24/23 1334   10/24/23 1245  metroNIDAZOLE (FLAGYL) IVPB 500 mg        500 mg 100 mL/hr over 60 Minutes Intravenous  Once 10/24/23 1236 10/24/23 1453       Assessment/Plan: S/p ex lap with subtotal abdominal colectomy for free air/ ischemic colitis (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 Magnus Ivan 2/17 -s/p exploratory laparotomy/ LOA/ resection of additional sigmoid colon stump/ debridement of abdominal wall 11/05/23 - s/p ex lap, washout, drain placement, and abdominal wall closure Dr. Corliss Skains 2/28   - vac changes as scheduled -if not extubating can try to trickle tube feeds again with bowel function -continue drain -appreciate CCM care   FEN: TPN, can trickle tube feeds from my standpoint VTE: therapeutic lovenox ID: [email or web address] 2/14. Zosyn/micafungin, follow wbc, at some point after last surgery may need ct scan to evaluate   Emelia Loron 11/27/2023

## 2023-12-09 NOTE — Progress Notes (Signed)
 PHARMACY - ANTICOAGULATION CONSULT NOTE  Pharmacy Consult for lovenox >> Heparin   Indication: atrial fibrillation  Allergies  Allergen Reactions   Zetia [Ezetimibe] Nausea And Vomiting    Patient Measurements: Height: 6\' 1"  (185.4 cm) Weight: 126 kg (277 lb 12.5 oz) IBW/kg (Calculated) : 79.9  Vital Signs: Temp: 97.8 F (36.6 C) (03/04 0800) Temp Source: Axillary (03/04 0800) BP: 126/83 (03/04 0800) Pulse Rate: 97 (03/04 0800)  Labs: Recent Labs    11/08/23 1719 11/09/23 0553 11/09/23 2142 11/10/23 0106 11/10/23 0149 11/10/23 0826 11/10/23 2227 11/30/2023 0516 11/13/2023 0733  HGB 8.3* 7.6*  --  6.8* 6.8* 8.1*  --   --   --   HCT 26.4* 24.7*  --  20.0* 21.4* 24.4*  --   --   --   PLT 303 242  --   --  174  --   --   --   --   HEPARINUNFRC  --   --   --   --   --   --  >1.10*  --  1.05*  CREATININE 1.58* 2.00* 2.46*  --  2.57*  --   --  3.23*  --     Estimated Creatinine Clearance: 34.7 mL/min (A) (by C-G formula based on SCr of 3.23 mg/dL (H)).   Assessment: 58 y.o. M well known to pharmacy from TPN dosing. Pt with new onset Afib started on full dose anticoagulation with Lovenox. Due to AKI and anemia, pharmacy consulted to transition from enoxaparin to heparin gtt.   Goal of Therapy:  Heparin level 0.3-0.7 units/ml Anti-Xa level 0.6-1 units/ml 4hrs after LMWH dose given Monitor platelets by anticoagulation protocol: Yes   Plan:  Stop heparin infusion x1 hour then  Decrease heparin to 1000 units/hr  Heparin level in 6hr  Daily HL , CBC F/u s/sx bleeding  Calton Dach, PharmD, BCCCP Clinical Pharmacist 11/13/2023 8:21 AM

## 2023-12-09 NOTE — TOC Progression Note (Signed)
 Transition of Care [facility]) - Progression Note    Patient Details  Name: Daniel Butler MRN: [id number] Date of Birth: 1966-02-02  [facility] CM/SW Contact  Mearl Latin, LCSW Phone Number: 11/23/2023, 12:00 PM  Clinical Narrative:    CSW received Hospice facility consult from MD. CSW met with patient's wife to offer choice. She requested 1-[facility] and 2-[facility]. CSW sent referral for review. DNR signed and placed on hard chart.    Expected Discharge Plan: Hospice Medical Facility Barriers to Discharge: Hospice Bed not available  Expected Discharge Plan and Services In-house Referral: Clinical Social Work, Hospice / Palliative Care   Post Acute Care Choice: Residential Hospice Bed Living arrangements for the past 2 months: Skilled Nursing Facility                                       Social Determinants of Health (SDOH) Interventions SDOH Screenings   Food Insecurity: Patient Unable To Answer (10/27/2023)  Housing: Patient Unable To Answer (10/27/2023)  Transportation Needs: Patient Unable To Answer (10/27/2023)  Utilities: Patient Unable To Answer (10/27/2023)  Alcohol Screen: Low Risk  (12/20/2022)  Depression (PHQ2-9): Low Risk  (12/20/2022)  Financial Resource Strain: High Risk (12/20/2022)  Physical Activity: Unknown (12/20/2022)  Social Connections: Socially Isolated (12/20/2022)  Stress: Stress Concern Present (12/20/2022)  Tobacco Use: Medium Risk (11/05/2023)    Readmission Risk Interventions    12/03/2023   11:59 AM 10/28/2023    5:25 PM 09/01/2023    4:24 PM  Readmission Risk Prevention Plan  Transportation Screening Complete Complete Complete  PCP or Specialist Appt within 5-7 Days   Complete  Home Care Screening   Complete  Medication Review (RN CM)   Complete  Medication Review (RN Care Manager)  Complete   PCP or Specialist appointment within 3-5 days of discharge  Complete   HRI or Home Care Consult  Complete   SW  Recovery Care/Counseling Consult  Complete   Palliative Care Screening  Not Applicable   Skilled Nursing Facility  Complete

## 2023-12-09 NOTE — TOC Transition Note (Signed)
 Transition of Care [facility]) - Discharge Note   Patient Details  Name: Daniel Butler MRN: [id number] Date of Birth: [birth year]  [facility]) CM/SW Contact:  Mearl Latin, LCSW Phone Number: 11/29/2023, 5:02 PM   Clinical Narrative:    Patient will DC to: [facility] Anticipated DC date: 11/08/2023 Family notified: Spouse Transport by: Sharin Mons   Per MD patient ready for DC to [facility]. RN to call report prior to discharge [phone]). RN, patient, patient's family, and facility notified of DC. Discharge Summary sent to facility. DC packet on chart including signed DNR. Ambulance transport requested for patient.   CSW will sign off for now as social work intervention is no longer needed. Please consult [place] again if new needs arise.     Final next level of care: Hospice Medical Facility Barriers to Discharge: Barriers Resolved   Patient Goals and CMS Choice Patient states their goals for this hospitalization and ongoing recovery are:: Comfort CMS Medicare.gov Compare Post Acute Care list provided to:: Patient Represenative (must comment) Choice offered to / list presented to : Spouse  ownership interest in [facility].provided to:: Spouse    Discharge Placement                Patient to be transferred to facility by: [facility] Name of family member notified: Spouse Patient and family notified of of transfer: 12/03/2023  Discharge Plan and Services Additional resources added to the After Visit Summary for   In-house Referral: Clinical Social Work, Hospice / Palliative Care   Post Acute Care Choice: Residential Hospice Bed                               Social Drivers of Health (SDOH) Interventions SDOH Screenings   Food Insecurity: Patient Unable To Answer (10/27/2023)  Housing: Patient Unable To Answer (10/27/2023)  Transportation Needs: Patient Unable To Answer (10/27/2023)  Utilities: Patient Unable To Answer  (10/27/2023)  Alcohol Screen: Low Risk  (12/20/2022)  Depression (PHQ2-9): Low Risk  (12/20/2022)  Financial Resource Strain: High Risk (12/20/2022)  Physical Activity: Unknown (12/20/2022)  Social Connections: Socially Isolated (12/20/2022)  Stress: Stress Concern Present (12/20/2022)  Tobacco Use: Medium Risk (11/05/2023)     Readmission Risk Interventions    12/03/2023   11:59 AM 10/28/2023    5:25 PM 09/01/2023    4:24 PM  Readmission Risk Prevention Plan  Transportation Screening Complete Complete Complete  PCP or Specialist Appt within 5-7 Days   Complete  Home Care Screening   Complete  Medication Review (RN CM)   Complete  Medication Review (RN Care Manager)  Complete   PCP or Specialist appointment within 3-5 days of discharge  Complete   HRI or Home Care Consult  Complete   SW Recovery Care/Counseling Consult  Complete   Palliative Care Screening  Not Applicable   Skilled Nursing Facility  Complete

## 2023-12-09 NOTE — Progress Notes (Signed)
 Pharmacy Antibiotic Note  Daniel Butler is a 58 y.o. male admitted on 10/24/2023 with  septic shock in the setting of bowel perforation and  E. Faecalis bacteremia. TTE on 2/16 negative for vegetations. In OR, patient found to have diffuse colonic distention with ischemia and gangrenous colonic necrosis with scattered areas of perforation and massive flocculent peritonitis.  Patient went back to OR on 2/26 and was found to have perforating sigmoid stump. Stump appeared necrotic likely from initial process causing colonic ischemia. 2/28 back to OR for ex lap, abdominal closure and wound VAC. Pharmacy has been consulted for vancomycin dosing. Patient is also on piperacillin/tazobactam .   Renal function worsening:  Last dose vanc 1750 x1 on 3/1 @ 2013 VR 37 mcg/mL on 3/4 @ 0516  Plan: Continue to hold vancomycin doses for now.  Dose by levels. Redose when level falls within therapeutic range  Zosyn 3.375gm IV every 8 hours  Monitor cultures, clinical status, renal function, vancomycin level Narrow abx as able and f/u duration   Height: 6\' 1"  (185.4 cm) Weight: 126 kg (277 lb 12.5 oz) IBW/kg (Calculated) : 79.9  Temp (24hrs), Avg:97.2 F (36.2 C), Min:96.2 F (35.7 C), Max:98.1 F (36.7 C)  Recent Labs  Lab 11/06/23 0009 11/06/23 0518 11/06/23 1710 11/07/23 0610 11/07/23 1719 11/08/23 0539 11/08/23 1719 11/09/23 0553 11/09/23 2142 11/10/23 0149 11/21/2023 0516  WBC 12.3*   < >  --  15.8*  --  11.0* 15.1* 19.5*  --  18.6*  --   CREATININE 1.95*   < >  --  1.20   < > 1.30* 1.58* 2.00* 2.46* 2.57* 3.23*  LATICACIDVEN 2.0*  --  3.2*  3.2*  --   --   --  1.4  --   --   --   --   VANCOTROUGH  --   --   --   --   --   --   --   --  59*  --   --   VANCORANDOM  --   --   --   --   --   --   --   --   --   --  37   < > = values in this interval not displayed.    Estimated Creatinine Clearance: 34.7 mL/min (A) (by C-G formula based on SCr of 3.23 mg/dL (H)).    Allergies  Allergen  Reactions   Zetia [Ezetimibe] Nausea And Vomiting    Antimicrobials this admission: Zosyn 2/15 >  Micafungin 2/1/4>2/19; 3/2> Hydrocort 2/15>20; 3/2> Cefepime 2/14> 2/15 Vanc 2/14 x1, 2/26 > Flagyl 2/14>2/15  Microbiology results: 2/14 BCx: 1/4 E faecalis  2/14 MRSA PCR: positive 2/14 RVP - neg 2/15 BCx - negative 3/2 BCX-  Calton Dach, PharmD, BCCCP Clinical Pharmacist 11/22/2023 8:32 AM

## 2023-12-09 NOTE — Procedures (Signed)
 Extubation Procedure Note  Patient Details:   Name: Daniel Butler DOB: 1965/10/24 MRN: [id number]   Airway Documentation:    Vent end date: 11/21/2023 Vent end time: 0920   Evaluation  O2 sats: stable throughout Complications: No apparent complications Patient did tolerate procedure well. Bilateral Breath Sounds: Clear, Diminished   Pt extubated one way and placed on 4l [place] with RN and wife at bedside. Pt is tolerating well.   Lajuan Lines 11/09/2023, 10:04 AM

## 2023-12-09 NOTE — Progress Notes (Signed)
 Pt on comfort care Wife at bedside. Pt in no acute distress at this time; PRNs to be used as needed. Awaiting transport to [facility]. All Family and patient needs addressed at this time

## 2023-12-09 NOTE — Progress Notes (Addendum)
 This chaplain responded to unit page for spiritual care presence before extubation. The Pt. is awake, able to nod, and share OK with his hand. The Pt. wife-[name] is at the bedside.  [name] accepted the chaplain's invitation for storytelling. The chaplain understands the Pt. 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 At this time [name] recognizes the Pt. legacy and religious beliefs through prayers for peace.  The chaplain understands from [name] after extubation the plan is for Pt. comfort focused care. [name] agreed with the chaplain's summary of comfort focused care means stopping all aggressive medical care. The chaplain updated the Pt. RN-Sydney on the conversation.  The chaplain understands close friends are available to support the Pt. and [name]. The chaplain offered F/U spiritual care as needed.  **1300 This chaplain is present for F/U spiritual care. The Pt. is awake and follows my movement in the room. The chaplain listens as the Pt. wife-[name] shares the visit from respiratory therapy. The chaplain affirmed [name] bedside presence and love.   Chaplain Stephanie Acre [phone]

## 2023-12-09 NOTE — Progress Notes (Addendum)
 NAME:  Daniel Butler, MRN:  [id number], DOB:  10-30-1965, LOS: 18 ADMISSION DATE:  10/24/2023, CONSULTATION DATE:  10/24/2023 REFERRING MD:  Dr. Bedelia Person - [facility], CHIEF COMPLAINT:  Bowel perf   History of Present Illness:  Daniel Butler is a 58 y.o. 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  States he has felt unwell x 3 days.  On ED arrival patient was seen febrile with temperature 103.4, tachypneic, and tachycardic.  Lab work significant for K2.4, glucose 116, creatinine 3.12, anion gap 26, alkaline phosphate 33, albumin 2.5, lactic 10.3, WBC 19.6.  CT abdomen and pelvis obtained which revealed substantial free air concerning for acute bowel perforation.  Surgery consulted and patient underwent exploratory laparotomy which revealed diffuse colonic distention with ischemia and gangrenous colonic necrosis with scattered areas of perforation and massive flocculent peritonitis.  Postprocedure patient remained intubated and sedated, PCCM consulted for further management admission.  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 OR for subtotal colectomy, intestinal discontinuity  2/15: con't on pressor support, ID consult  2/17: exploratory laparotomy, end ileostomy 2/18: goal to wake up and wean, will need TPN  2/19: waking, gets tachypneic, tachycardic with SBT 2/20: failed SBT. Starting trickle feeds, lasix BID, PO oxy and klonopin  2/21: extubated. 2/26 fluid bolus and free water per tube added 2/26 reintubated for respiratory distress secondary to possible aspiration during abdominal CT scan.  Brought back to operating room for perforated sigmoid stump.  Underwent lysis of adhesions.  Excision of necrotic tissue and partial sigmoid resection. 2/28 Went back to the OR for fascial closure. S/p 2 units PRBC.  No evidence of active bleed. Treatment dose Lovenox held 3/1 rapid A-fib, Levophed changed to phenylephrine.  Amio drip and bolus.  Tube feeds held due to emesis, concern for aspiration, restarted on treatment dose Lovenox 3/3 Overnight had episodes of pauses/bradycardia. Amio was turned off. Bicarb amp given. Transfused PRBC x 1 unit overnight for Hg 6.8  Interim History / Subjective:   On minimal neo support PS trial this am Minimal response to lasix. Remains oliguric  Objective   Blood pressure 126/83, pulse 97, temperature 97.8 F (36.6 C), temperature source Axillary, resp. rate (!) 25, height 6\' 1"  (1.854 m), weight 126 kg, SpO2 90%. CVP:  [6 mmHg-12 mmHg] 7 mmHg  SpO2: 90 % O2 Flow Rate (L/min): 15 L/min FiO2 (%): 40 %   Intake/Output Summary (Last 24 hours) at 11/08/2023 0820 Last data filed at 12/01/2023 0800 Gross per 24 hour  Intake  2580.14 ml  Output 1472 ml  Net 1108.14 ml   Filed  Weights   11/08/23 0500 11/09/23 0703 12/06/2023 0500  Weight: 128 kg 131.7 kg 126 kg   Physical Exam: General: Chronically ill-appearing, no acute distress, mildly anxious HENT: [place], AT, ETT in place Eyes: EOMI, no scleral icterus Respiratory: Clear to auscultation bilaterally.  No crackles, wheezing or rales Cardiovascular: RRR, -M/R/G, no JVD GI: Wound vac in place, JP drain in place, BS+, soft, nontender Extremities: 3+ edema,-tenderness Neuro: Awake and alert, follows commands, CNII-XII grossly intact GU: Foley in place  Imaging, labs and test noted above have been reviewed independently by me.  K 3.1 CO2 14, slightly worsening BUN/Cr 97/3.23 worsening AG 15   Ancillary tests personally reviewed    Assessment & Plan:   Gangrenous Colitis  with enterococcus fecalis bacteremia. Status post initial colonic resection with ileostomy Now status post sigmoid resection and abscess drainage - Management per surgery - Monitor wound vac output.  -Pain control  Septic shock secondary peritonitis, e. Faecalis bacteremia  - improving -Zosyn, vancomycin.  Mica restarted on 3/2 in setting of worsening leukocytosis and TPN utilization  -F/u 3/2 cultures -Wean neo -Taper stress dose steroids  Acute hypoxic respiratory failure secondary to above and possible aspiration. -PS and plan for one way extubation today  Atrial fibrillation: (eliquis). Now difficult to control likely due to sepsis and critical illness.  Acute on chronic HFpEF. (LVEF 50-55%) - Amio 3/3 due to bradycardia - Continue heparin gtt due to AKI - Remains >20L positive for the admission likely not totally accurate considering multiple abdominal surgeries.   Acute kidney injury - Worsening, oliguric. Failed lasix trial 3/3 Hypokalemia -Monitor urine output and creatinine -Family would not pursue dialysis in setting of other co-morbidities at this time  Anemia: Hgb 12 at presentation. Over the course of the  admission has been in the 7-9 range. 5.6 this morning. Suspect this acute drop is in the post-operative setting + lovenox. S/p 2 units PRBC on 2/28.  No evidence of active bleed.  Monitor labs S/p 1 unit PRBC 3/2 - Trend  Severe protein calorie malnutrition present on admission Hypoglyemia -Able to tolerate tube feeds -Resume TPN -CBG  Severe deconditioning - Will need aggressive rehab when able  Goals of care Discussed with wife at bedside on 3/2 I am concerned about his worsening multiorgan failure and possible need for dialysis His wife tells me that he has been through a lot recently with multiple admissions, surgeries.  She does not think dialysis is a good idea for him and has agreed to no CPR or defibrillations.  We talked about changing focus to more comfort and possible withdrawal of care and she wants to think more about it.  Best Practice (right click and "Reselect all SmartList Selections" daily)   Diet/type: TPN DVT prophylaxis: heparin gtt due to AKI Pressure ulcer(s): N/A GI prophylaxis: PPI Lines: Left subclavian, art line-both still necessary Foley: Foley catheter still necessary Code Status:  limited Last date of multidisciplinary goals of care discussion: updated wife at bedside 3/4  The patient is critically ill with multiple organ systems failure and requires high complexity decision making for assessment and support, frequent evaluation and titration of therapies, application of advanced monitoring technologies and extensive interpretation of multiple databases.  Independent Critical Care Time: 35 Minutes.   Mechele Collin, M.D. [facility] Pulmonary/Critical Care Medicine 11/18/2023 8:20 AM   Please see Amion for pager number to reach on-call Pulmonary and Critical Care  Team.

## 2023-12-09 DEATH — deceased
